# Patient Record
Sex: Female | Born: 1950
Health system: Southern US, Community
[De-identification: ages and names within clinical notes are randomized; demographics above are authoritative.]

## PROBLEM LIST (undated history)

## (undated) DIAGNOSIS — M21619 Bunion of unspecified foot: Secondary | ICD-10-CM

## (undated) DIAGNOSIS — G20 Parkinson's disease: Secondary | ICD-10-CM

## (undated) DIAGNOSIS — I10 Essential (primary) hypertension: Secondary | ICD-10-CM

## (undated) DIAGNOSIS — K5730 Diverticulosis of large intestine without perforation or abscess without bleeding: Secondary | ICD-10-CM

## (undated) DIAGNOSIS — K649 Unspecified hemorrhoids: Secondary | ICD-10-CM

## (undated) DIAGNOSIS — J984 Other disorders of lung: Secondary | ICD-10-CM

## (undated) DIAGNOSIS — G20A1 Parkinson's disease without dyskinesia, without mention of fluctuations: Secondary | ICD-10-CM

## (undated) DIAGNOSIS — G5600 Carpal tunnel syndrome, unspecified upper limb: Secondary | ICD-10-CM

## (undated) DIAGNOSIS — E785 Hyperlipidemia, unspecified: Secondary | ICD-10-CM

## (undated) DIAGNOSIS — F411 Generalized anxiety disorder: Secondary | ICD-10-CM

## (undated) DIAGNOSIS — K219 Gastro-esophageal reflux disease without esophagitis: Secondary | ICD-10-CM

## (undated) DIAGNOSIS — R430 Anosmia: Secondary | ICD-10-CM

## (undated) DIAGNOSIS — R1013 Epigastric pain: Secondary | ICD-10-CM

## (undated) DIAGNOSIS — R7302 Impaired glucose tolerance (oral): Secondary | ICD-10-CM

## (undated) DIAGNOSIS — K589 Irritable bowel syndrome without diarrhea: Secondary | ICD-10-CM

## (undated) DIAGNOSIS — M810 Age-related osteoporosis without current pathological fracture: Secondary | ICD-10-CM

## (undated) DIAGNOSIS — E739 Lactose intolerance, unspecified: Secondary | ICD-10-CM

## (undated) DIAGNOSIS — F329 Major depressive disorder, single episode, unspecified: Secondary | ICD-10-CM

## (undated) HISTORY — DX: Generalized anxiety disorder: F41.1

## (undated) HISTORY — DX: Essential (primary) hypertension: I10

## (undated) HISTORY — DX: Anosmia: R43.0

## (undated) HISTORY — DX: Carpal tunnel syndrome, unspecified upper limb: G56.00

## (undated) HISTORY — DX: Major depressive disorder, single episode, unspecified: F32.9

## (undated) HISTORY — DX: Gastro-esophageal reflux disease without esophagitis: K21.9

## (undated) HISTORY — DX: Unspecified hemorrhoids: K64.9

## (undated) HISTORY — DX: Irritable bowel syndrome without diarrhea: K58.9

## (undated) HISTORY — DX: Diverticulosis of large intestine without perforation or abscess without bleeding: K57.30

## (undated) HISTORY — DX: Age-related osteoporosis without current pathological fracture: M81.0

## (undated) HISTORY — DX: Impaired glucose tolerance (oral): R73.02

## (undated) HISTORY — DX: Parkinson's disease without dyskinesia, without mention of fluctuations: G20.A1

## (undated) HISTORY — DX: Other disorders of lung: J98.4

## (undated) HISTORY — DX: Hyperlipidemia, unspecified: E78.5

## (undated) HISTORY — PX: OTHER SURGICAL HISTORY: SHX169

## (undated) HISTORY — PX: TUBAL LIGATION: SHX77

## (undated) HISTORY — DX: Bunion of unspecified foot: M21.619

## (undated) HISTORY — PX: CARDIAC CATHETERIZATION: SHX172

## (undated) HISTORY — DX: Parkinson's disease: G20

## (undated) HISTORY — PX: UPPER GASTROINTESTINAL ENDOSCOPY: SHX188

## (undated) HISTORY — PX: CATARACT EXTRACTION: SUR2

## (undated) HISTORY — DX: Lactose intolerance, unspecified: E73.9

## (undated) HISTORY — DX: Epigastric pain: R10.13

---

## 1998-03-22 ENCOUNTER — Other Ambulatory Visit: Admission: RE | Admit: 1998-03-22 | Discharge: 1998-03-22 | Payer: Self-pay | Admitting: *Deleted

## 1999-06-28 ENCOUNTER — Other Ambulatory Visit: Admission: RE | Admit: 1999-06-28 | Discharge: 1999-06-28 | Payer: Self-pay | Admitting: *Deleted

## 2000-12-29 ENCOUNTER — Encounter: Payer: Self-pay | Admitting: Internal Medicine

## 2000-12-29 LAB — CONVERTED CEMR LAB

## 2001-01-12 ENCOUNTER — Other Ambulatory Visit: Admission: RE | Admit: 2001-01-12 | Discharge: 2001-01-12 | Payer: Self-pay | Admitting: Internal Medicine

## 2001-08-14 ENCOUNTER — Ambulatory Visit (HOSPITAL_COMMUNITY): Admission: RE | Admit: 2001-08-14 | Discharge: 2001-08-14 | Payer: Self-pay | Admitting: *Deleted

## 2001-08-14 ENCOUNTER — Encounter (INDEPENDENT_AMBULATORY_CARE_PROVIDER_SITE_OTHER): Payer: Self-pay | Admitting: Specialist

## 2003-07-26 ENCOUNTER — Encounter: Admission: RE | Admit: 2003-07-26 | Discharge: 2003-10-24 | Payer: Self-pay | Admitting: Internal Medicine

## 2004-01-02 ENCOUNTER — Ambulatory Visit: Payer: Self-pay | Admitting: Internal Medicine

## 2004-06-28 ENCOUNTER — Ambulatory Visit: Payer: Self-pay | Admitting: Internal Medicine

## 2004-11-22 ENCOUNTER — Encounter: Admission: RE | Admit: 2004-11-22 | Discharge: 2004-11-22 | Payer: Self-pay | Admitting: Orthopedic Surgery

## 2004-11-29 ENCOUNTER — Ambulatory Visit (HOSPITAL_BASED_OUTPATIENT_CLINIC_OR_DEPARTMENT_OTHER): Admission: RE | Admit: 2004-11-29 | Discharge: 2004-11-29 | Payer: Self-pay | Admitting: Orthopedic Surgery

## 2004-11-29 ENCOUNTER — Ambulatory Visit (HOSPITAL_COMMUNITY): Admission: RE | Admit: 2004-11-29 | Discharge: 2004-11-29 | Payer: Self-pay | Admitting: Orthopedic Surgery

## 2004-11-29 ENCOUNTER — Encounter (INDEPENDENT_AMBULATORY_CARE_PROVIDER_SITE_OTHER): Payer: Self-pay | Admitting: Specialist

## 2005-01-11 ENCOUNTER — Ambulatory Visit (HOSPITAL_COMMUNITY): Admission: RE | Admit: 2005-01-11 | Discharge: 2005-01-11 | Payer: Self-pay | Admitting: Orthopedic Surgery

## 2005-01-11 ENCOUNTER — Ambulatory Visit (HOSPITAL_BASED_OUTPATIENT_CLINIC_OR_DEPARTMENT_OTHER): Admission: RE | Admit: 2005-01-11 | Discharge: 2005-01-11 | Payer: Self-pay | Admitting: Orthopedic Surgery

## 2005-01-25 ENCOUNTER — Ambulatory Visit: Payer: Self-pay | Admitting: Internal Medicine

## 2005-07-08 ENCOUNTER — Ambulatory Visit: Payer: Self-pay | Admitting: Internal Medicine

## 2005-08-14 ENCOUNTER — Ambulatory Visit: Payer: Self-pay | Admitting: Internal Medicine

## 2005-08-19 ENCOUNTER — Ambulatory Visit: Payer: Self-pay | Admitting: Internal Medicine

## 2005-09-06 ENCOUNTER — Ambulatory Visit: Payer: Self-pay | Admitting: Family Medicine

## 2006-01-28 HISTORY — PX: COLONOSCOPY W/ BIOPSIES: SHX1374

## 2006-01-28 HISTORY — PX: ESOPHAGOGASTRODUODENOSCOPY: SHX1529

## 2006-01-28 HISTORY — PX: COLONOSCOPY: SHX174

## 2006-05-16 ENCOUNTER — Ambulatory Visit: Payer: Self-pay | Admitting: Internal Medicine

## 2006-05-16 LAB — CONVERTED CEMR LAB
Bilirubin Urine: NEGATIVE
Crystals: NEGATIVE
Mucus, UA: NEGATIVE
Nitrite: POSITIVE — AB
Specific Gravity, Urine: >=1.03 (ref 1.000–1.03)
Total Protein, Urine: 100 mg/dL — AB
Urine Glucose: NEGATIVE mg/dL
Urobilinogen, UA: 0.2 (ref 0.0–1.0)
pH: 6 (ref 5.0–8.0)

## 2006-05-17 ENCOUNTER — Encounter: Payer: Self-pay | Admitting: Internal Medicine

## 2006-09-12 ENCOUNTER — Encounter: Payer: Self-pay | Admitting: Internal Medicine

## 2006-09-12 ENCOUNTER — Ambulatory Visit (HOSPITAL_COMMUNITY): Admission: RE | Admit: 2006-09-12 | Discharge: 2006-09-12 | Payer: Self-pay | Admitting: Gastroenterology

## 2006-09-12 ENCOUNTER — Encounter (INDEPENDENT_AMBULATORY_CARE_PROVIDER_SITE_OTHER): Payer: Self-pay | Admitting: Gastroenterology

## 2006-09-12 LAB — HM COLONOSCOPY

## 2006-09-23 ENCOUNTER — Encounter: Payer: Self-pay | Admitting: Internal Medicine

## 2006-09-23 DIAGNOSIS — F411 Generalized anxiety disorder: Secondary | ICD-10-CM | POA: Insufficient documentation

## 2006-09-23 DIAGNOSIS — K589 Irritable bowel syndrome without diarrhea: Secondary | ICD-10-CM

## 2006-09-23 DIAGNOSIS — G5603 Carpal tunnel syndrome, bilateral upper limbs: Secondary | ICD-10-CM | POA: Insufficient documentation

## 2006-09-23 DIAGNOSIS — I10 Essential (primary) hypertension: Secondary | ICD-10-CM

## 2006-09-23 DIAGNOSIS — G5600 Carpal tunnel syndrome, unspecified upper limb: Secondary | ICD-10-CM

## 2006-09-23 DIAGNOSIS — F329 Major depressive disorder, single episode, unspecified: Secondary | ICD-10-CM

## 2006-09-23 DIAGNOSIS — E785 Hyperlipidemia, unspecified: Secondary | ICD-10-CM

## 2006-09-23 DIAGNOSIS — K5730 Diverticulosis of large intestine without perforation or abscess without bleeding: Secondary | ICD-10-CM

## 2006-09-23 DIAGNOSIS — F32A Depression, unspecified: Secondary | ICD-10-CM | POA: Insufficient documentation

## 2006-09-23 DIAGNOSIS — F3289 Other specified depressive episodes: Secondary | ICD-10-CM

## 2006-09-23 HISTORY — DX: Hyperlipidemia, unspecified: E78.5

## 2006-09-23 HISTORY — DX: Irritable bowel syndrome, unspecified: K58.9

## 2006-09-23 HISTORY — DX: Carpal tunnel syndrome, unspecified upper limb: G56.00

## 2006-09-23 HISTORY — DX: Diverticulosis of large intestine without perforation or abscess without bleeding: K57.30

## 2006-09-23 HISTORY — DX: Essential (primary) hypertension: I10

## 2006-09-23 HISTORY — DX: Other specified depressive episodes: F32.89

## 2006-09-23 HISTORY — DX: Major depressive disorder, single episode, unspecified: F32.9

## 2006-09-23 HISTORY — DX: Generalized anxiety disorder: F41.1

## 2006-10-03 ENCOUNTER — Ambulatory Visit (HOSPITAL_COMMUNITY): Admission: RE | Admit: 2006-10-03 | Discharge: 2006-10-03 | Payer: Self-pay | Admitting: Gastroenterology

## 2006-10-23 ENCOUNTER — Ambulatory Visit: Payer: Self-pay | Admitting: Internal Medicine

## 2006-10-23 LAB — CONVERTED CEMR LAB
ALT: 24 units/L (ref 0–35)
AST: 25 units/L (ref 0–37)
Albumin: 3.8 g/dL (ref 3.5–5.2)
Alkaline Phosphatase: 49 units/L (ref 39–117)
BUN: 15 mg/dL (ref 6–23)
Basophils Absolute: 0 10*3/uL (ref 0.0–0.1)
Basophils Relative: 0.4 % (ref 0.0–1.0)
Bilirubin Urine: NEGATIVE
Bilirubin, Direct: 0.1 mg/dL (ref 0.0–0.3)
CO2: 34 meq/L — ABNORMAL HIGH (ref 19–32)
Calcium: 10.3 mg/dL (ref 8.4–10.5)
Chloride: 102 meq/L (ref 96–112)
Cholesterol: 167 mg/dL (ref 0–200)
Creatinine, Ser: 0.9 mg/dL (ref 0.4–1.2)
Crystals: NEGATIVE
Direct LDL: 86 mg/dL
Eosinophils Absolute: 0.1 10*3/uL (ref 0.0–0.6)
Eosinophils Relative: 1.2 % (ref 0.0–5.0)
GFR calc Af Amer: 83 mL/min
GFR calc non Af Amer: 68 mL/min
Glucose, Bld: 100 mg/dL — ABNORMAL HIGH (ref 70–99)
HCT: 37.3 % (ref 36.0–46.0)
HDL: 53.4 mg/dL (ref >39.0)
Hemoglobin, Urine: NEGATIVE
Hemoglobin: 13 g/dL (ref 12.0–15.0)
Ketones, ur: NEGATIVE mg/dL
Lymphocytes Relative: 15.7 % (ref 12.0–46.0)
MCHC: 34.7 g/dL (ref 30.0–36.0)
MCV: 86.4 fL (ref 78.0–100.0)
Monocytes Absolute: 0.4 10*3/uL (ref 0.2–0.7)
Monocytes Relative: 7.1 % (ref 3.0–11.0)
Mucus, UA: NEGATIVE
Neutro Abs: 3.8 10*3/uL (ref 1.4–7.7)
Neutrophils Relative %: 75.6 % (ref 43.0–77.0)
Nitrite: NEGATIVE
Platelets: 330 10*3/uL (ref 150–400)
Potassium: 4.9 meq/L (ref 3.5–5.1)
RBC / HPF: NONE SEEN
RBC: 4.32 M/uL (ref 3.87–5.11)
RDW: 12.2 % (ref 11.5–14.6)
Sodium: 142 meq/L (ref 135–145)
Specific Gravity, Urine: 1.015 (ref 1.000–1.03)
TSH: 0.77 microintl units/mL (ref 0.35–5.50)
Total Bilirubin: 0.8 mg/dL (ref 0.3–1.2)
Total CHOL/HDL Ratio: 3.1
Total Protein, Urine: NEGATIVE mg/dL
Total Protein: 7 g/dL (ref 6.0–8.3)
Triglycerides: 214 mg/dL (ref 0–149)
Urine Glucose: NEGATIVE mg/dL
Urobilinogen, UA: 0.2 (ref 0.0–1.0)
VLDL: 43 mg/dL — ABNORMAL HIGH (ref 0–40)
WBC: 5.1 10*3/uL (ref 4.5–10.5)
pH: 6.5 (ref 5.0–8.0)

## 2006-10-29 ENCOUNTER — Ambulatory Visit: Payer: Self-pay | Admitting: Internal Medicine

## 2007-04-09 ENCOUNTER — Encounter: Payer: Self-pay | Admitting: Internal Medicine

## 2007-04-21 ENCOUNTER — Encounter: Payer: Self-pay | Admitting: Internal Medicine

## 2007-10-12 ENCOUNTER — Encounter: Payer: Self-pay | Admitting: Internal Medicine

## 2007-10-29 ENCOUNTER — Ambulatory Visit: Payer: Self-pay | Admitting: Family Medicine

## 2007-10-29 ENCOUNTER — Encounter: Payer: Self-pay | Admitting: Internal Medicine

## 2008-02-29 ENCOUNTER — Telehealth: Payer: Self-pay | Admitting: Internal Medicine

## 2008-03-01 ENCOUNTER — Telehealth (INDEPENDENT_AMBULATORY_CARE_PROVIDER_SITE_OTHER): Payer: Self-pay | Admitting: *Deleted

## 2008-03-02 ENCOUNTER — Encounter: Payer: Self-pay | Admitting: Internal Medicine

## 2008-05-13 ENCOUNTER — Telehealth: Payer: Self-pay | Admitting: Internal Medicine

## 2008-05-17 ENCOUNTER — Ambulatory Visit: Payer: Self-pay | Admitting: Internal Medicine

## 2008-05-17 LAB — CONVERTED CEMR LAB
ALT: 23 units/L (ref 0–35)
AST: 25 units/L (ref 0–37)
Albumin: 3.9 g/dL (ref 3.5–5.2)
Alkaline Phosphatase: 52 units/L (ref 39–117)
BUN: 20 mg/dL (ref 6–23)
Basophils Absolute: 0 10*3/uL (ref 0.0–0.1)
Basophils Relative: 0.8 % (ref 0.0–3.0)
Bilirubin Urine: NEGATIVE
Bilirubin, Direct: 0.1 mg/dL (ref 0.0–0.3)
CO2: 32 meq/L (ref 19–32)
Calcium: 9.7 mg/dL (ref 8.4–10.5)
Chloride: 105 meq/L (ref 96–112)
Cholesterol: 183 mg/dL (ref 0–200)
Creatinine, Ser: 0.8 mg/dL (ref 0.4–1.2)
Eosinophils Absolute: 0 10*3/uL (ref 0.0–0.7)
Eosinophils Relative: 1.1 % (ref 0.0–5.0)
GFR calc non Af Amer: 77.86 mL/min (ref >60)
Glucose, Bld: 101 mg/dL — ABNORMAL HIGH (ref 70–99)
HCT: 42.4 % (ref 36.0–46.0)
HDL: 56.3 mg/dL (ref >39.00)
Hemoglobin, Urine: NEGATIVE
Hemoglobin: 14.9 g/dL (ref 12.0–15.0)
Ketones, ur: NEGATIVE mg/dL
LDL Cholesterol: 98 mg/dL (ref 0–99)
Leukocytes, UA: NEGATIVE
Lymphocytes Relative: 17.4 % (ref 12.0–46.0)
Lymphs Abs: 0.8 10*3/uL (ref 0.7–4.0)
MCHC: 35.1 g/dL (ref 30.0–36.0)
MCV: 85.5 fL (ref 78.0–100.0)
Monocytes Absolute: 0.3 10*3/uL (ref 0.1–1.0)
Monocytes Relative: 6.9 % (ref 3.0–12.0)
Neutro Abs: 3.4 10*3/uL (ref 1.4–7.7)
Neutrophils Relative %: 73.8 % (ref 43.0–77.0)
Nitrite: NEGATIVE
Platelets: 300 10*3/uL (ref 150.0–400.0)
Potassium: 4.5 meq/L (ref 3.5–5.1)
RBC: 4.97 M/uL (ref 3.87–5.11)
RDW: 11.7 % (ref 11.5–14.6)
Sodium: 142 meq/L (ref 135–145)
Specific Gravity, Urine: 1.025 (ref 1.000–1.030)
TSH: 1.14 microintl units/mL (ref 0.35–5.50)
Total Bilirubin: 0.8 mg/dL (ref 0.3–1.2)
Total CHOL/HDL Ratio: 3
Total Protein, Urine: NEGATIVE mg/dL
Total Protein: 7.3 g/dL (ref 6.0–8.3)
Triglycerides: 144 mg/dL (ref 0.0–149.0)
Urine Glucose: NEGATIVE mg/dL
Urobilinogen, UA: 0.2 (ref 0.0–1.0)
VLDL: 28.8 mg/dL (ref 0.0–40.0)
WBC: 4.5 10*3/uL (ref 4.5–10.5)
pH: 6 (ref 5.0–8.0)

## 2008-05-24 ENCOUNTER — Ambulatory Visit: Payer: Self-pay | Admitting: Internal Medicine

## 2008-05-24 ENCOUNTER — Telehealth (INDEPENDENT_AMBULATORY_CARE_PROVIDER_SITE_OTHER): Payer: Self-pay | Admitting: *Deleted

## 2008-05-24 DIAGNOSIS — E739 Lactose intolerance, unspecified: Secondary | ICD-10-CM

## 2008-05-24 DIAGNOSIS — K219 Gastro-esophageal reflux disease without esophagitis: Secondary | ICD-10-CM

## 2008-05-24 DIAGNOSIS — M810 Age-related osteoporosis without current pathological fracture: Secondary | ICD-10-CM | POA: Insufficient documentation

## 2008-05-24 HISTORY — DX: Age-related osteoporosis without current pathological fracture: M81.0

## 2008-05-24 HISTORY — DX: Gastro-esophageal reflux disease without esophagitis: K21.9

## 2008-05-24 HISTORY — DX: Lactose intolerance, unspecified: E73.9

## 2008-08-29 ENCOUNTER — Telehealth: Payer: Self-pay | Admitting: Internal Medicine

## 2008-09-28 LAB — CONVERTED CEMR LAB: Pap Smear: NORMAL

## 2008-09-28 LAB — HM MAMMOGRAPHY: HM Mammogram: NORMAL

## 2008-10-24 ENCOUNTER — Encounter: Payer: Self-pay | Admitting: Internal Medicine

## 2008-11-01 ENCOUNTER — Telehealth: Payer: Self-pay | Admitting: Internal Medicine

## 2009-05-05 ENCOUNTER — Telehealth: Payer: Self-pay | Admitting: Internal Medicine

## 2009-08-09 ENCOUNTER — Telehealth: Payer: Self-pay | Admitting: Internal Medicine

## 2009-09-14 ENCOUNTER — Ambulatory Visit: Payer: Self-pay | Admitting: Internal Medicine

## 2009-09-14 LAB — CONVERTED CEMR LAB
ALT: 23 units/L (ref 0–35)
AST: 24 units/L (ref 0–37)
Albumin: 3.9 g/dL (ref 3.5–5.2)
Alkaline Phosphatase: 45 units/L (ref 39–117)
BUN: 18 mg/dL (ref 6–23)
Basophils Absolute: 0 10*3/uL (ref 0.0–0.1)
Basophils Relative: 0.7 % (ref 0.0–3.0)
Bilirubin Urine: NEGATIVE
Bilirubin, Direct: 0.2 mg/dL (ref 0.0–0.3)
CO2: 29 meq/L (ref 19–32)
Calcium: 9.6 mg/dL (ref 8.4–10.5)
Chloride: 102 meq/L (ref 96–112)
Cholesterol: 157 mg/dL (ref 0–200)
Creatinine, Ser: 0.8 mg/dL (ref 0.4–1.2)
Eosinophils Absolute: 0.1 10*3/uL (ref 0.0–0.7)
Eosinophils Relative: 1.5 % (ref 0.0–5.0)
GFR calc non Af Amer: 83.51 mL/min (ref >60)
Glucose, Bld: 81 mg/dL (ref 70–99)
HCT: 40.2 % (ref 36.0–46.0)
HDL: 48.8 mg/dL (ref >39.00)
Hemoglobin, Urine: NEGATIVE
Hemoglobin: 13.9 g/dL (ref 12.0–15.0)
Ketones, ur: NEGATIVE mg/dL
LDL Cholesterol: 74 mg/dL (ref 0–99)
Leukocytes, UA: NEGATIVE
Lymphocytes Relative: 21.1 % (ref 12.0–46.0)
Lymphs Abs: 1.1 10*3/uL (ref 0.7–4.0)
MCHC: 34.7 g/dL (ref 30.0–36.0)
MCV: 89.8 fL (ref 78.0–100.0)
Monocytes Absolute: 0.4 10*3/uL (ref 0.1–1.0)
Monocytes Relative: 7 % (ref 3.0–12.0)
Neutro Abs: 3.5 10*3/uL (ref 1.4–7.7)
Neutrophils Relative %: 69.7 % (ref 43.0–77.0)
Nitrite: NEGATIVE
Platelets: 306 10*3/uL (ref 150.0–400.0)
Potassium: 4.5 meq/L (ref 3.5–5.1)
RBC: 4.47 M/uL (ref 3.87–5.11)
RDW: 12.1 % (ref 11.5–14.6)
Sodium: 139 meq/L (ref 135–145)
Specific Gravity, Urine: 1.02 (ref 1.000–1.030)
TSH: 0.92 microintl units/mL (ref 0.35–5.50)
Total Bilirubin: 0.5 mg/dL (ref 0.3–1.2)
Total CHOL/HDL Ratio: 3
Total Protein, Urine: NEGATIVE mg/dL
Total Protein: 6.6 g/dL (ref 6.0–8.3)
Triglycerides: 173 mg/dL — ABNORMAL HIGH (ref 0.0–149.0)
Urine Glucose: NEGATIVE mg/dL
Urobilinogen, UA: 0.2 (ref 0.0–1.0)
VLDL: 34.6 mg/dL (ref 0.0–40.0)
WBC: 5 10*3/uL (ref 4.5–10.5)
pH: 7 (ref 5.0–8.0)

## 2009-09-18 ENCOUNTER — Encounter: Payer: Self-pay | Admitting: Internal Medicine

## 2009-09-18 ENCOUNTER — Ambulatory Visit: Payer: Self-pay | Admitting: Internal Medicine

## 2009-09-18 DIAGNOSIS — M21619 Bunion of unspecified foot: Secondary | ICD-10-CM

## 2009-09-18 DIAGNOSIS — R1013 Epigastric pain: Secondary | ICD-10-CM

## 2009-09-18 HISTORY — DX: Bunion of unspecified foot: M21.619

## 2009-09-18 HISTORY — DX: Epigastric pain: R10.13

## 2009-09-21 ENCOUNTER — Encounter: Payer: Self-pay | Admitting: Internal Medicine

## 2009-09-21 ENCOUNTER — Encounter (INDEPENDENT_AMBULATORY_CARE_PROVIDER_SITE_OTHER): Payer: Self-pay | Admitting: *Deleted

## 2010-02-25 LAB — CONVERTED CEMR LAB: Pap Smear: NORMAL

## 2010-02-27 NOTE — Letter (Signed)
Summary: [facility] Consult Scheduled Letter  [facility]  [address] [place], [place] [address]   Phone: [phone]  Fax: [phone]      09/21/2009 MRN: [id number]  TYSHAWN KEEL [address] [place], [place]  [address]    Dear Ms. Dareen Piano,      We have scheduled an appointment for you.  At the recommendation of Dr.James Jonny Ruiz we have scheduled you a consult with [facility]) Dr.Ajlouny on 09-29-2009 at 11:00am arrive at 10:45.Their phone number is [phone]. If this appointment day and time is not convenient for you, please feel free to call the office of the doctor you are being referred to at the number listed above and reschedule the appointment.  [facility] [address], [place], [place] [address]  [phone]   Thank you,  Patient Care Coordinator [facility]

## 2010-02-27 NOTE — Progress Notes (Signed)
  Phone Note Refill Request  on May 05, 2009 1:09 PM  Refills Requested: Medication #1:  ACTONEL 150 MG TABS 1 by mouth q month   Dosage confirmed as above?Dosage Confirmed   Notes: [facility] Initial call taken by: Scharlene Gloss,  May 05, 2009 1:10 PM    Prescriptions: ACTONEL 150 MG TABS (RISEDRONATE SODIUM) 1 by mouth q month  #3 x 0   Entered by:   Scharlene Gloss   Authorized by:   Corwin Levins MD   Signed by:   Scharlene Gloss on 05/05/2009   Method used:   Faxed to ...       [facility]* (retail)       [address]       [address] Shipping/mailing       [place], [place]  [address]       Ph: [phone]       Fax: [phone]   RxID:   [id number]

## 2010-02-27 NOTE — Assessment & Plan Note (Signed)
Summary: PHYSICAL--STC   Vital Signs:  Patient profile:   60 year old female Height:      58.5 inches Weight:      127.50 pounds BMI:     26.29 O2 Sat:      95 % on Room air Temp:     98.3 degrees F oral Pulse rate:   92 / minute BP sitting:   120 / 82  (left arm) Cuff size:   regular  Vitals Entered By: Zella Ball Ewing CMA Duncan Dull) (September 18, 2009 10:33 AM)  O2 Flow:  Room air  Preventive Care Screening  Pap Smear:    Date:  09/28/2008    Results:  normal   Mammogram:    Date:  09/28/2008    Results:  normal   Bone Density:    Date:  10/12/2007    Results:  abnormal std dev  CC: Adult Physical/RE   CC:  Adult Physical/RE.  History of Present Illness: overall doing well;  lost several lbs; Pt denies CP, worsening sob, doe, wheezing, orthopnea, pnd, worsening LE edema, palps, dizziness or syncope  Pt denies new neuro symptoms such as headache, facial or extremity weakness No fever, wt loss, night sweats, loss of appetite or other constitutional symptoms Has had some nauea persistent on the actonel adn not clear if having significant reflux;  no dysphgia, vomiting but has some upper abd discofort.  Last EGD 2008 per Dr Loreta Ave neg for cancer.  Still with occas diarrhea nad has known hemorrhoids and still wtih occas small volume hematochiezia.   Weaned herself off the sertarline last yr, still with mild recurring depressive symtpoms, but not severe enough ot re-start meds.  has mild pain for the right bunion that keeps recurring.    Problems Prior to Update: 1)  Bunion, Right Foot  (ICD-727.1) 2)  Abdominal Pain, Epigastric  (ICD-789.06) 3)  Preventive Health Care  (ICD-V70.0) 4)  Gerd  (ICD-530.81) 5)  Glucose Intolerance  (ICD-271.3) 6)  Osteoporosis  (ICD-733.00) 7)  Carpal Tunnel Syndrome, Bilateral  (ICD-354.0) 8)  Diverticulosis, Colon  (ICD-562.10) 9)  Depression  (ICD-311) 10)  Anxiety  (ICD-300.00) 11)  Ibs  (ICD-564.1) 12)  Hypertension  (ICD-401.9) 13)   Hyperlipidemia  (ICD-272.4)  Medications Prior to Update: 1)  Cartia Xt 300 Mg  Cp24 (Diltiazem Hcl Coated Beads) .Marland Kitchen.. 1 By Mouth Once Daily 2)  Zoloft 100 Mg  Tabs (Sertraline Hcl) .Marland Kitchen.. 1 By Mouth Two Times A Day 3)  Benicar Hct 40-25 Mg  Tabs (Olmesartan Medoxomil-Hctz) .Marland Kitchen.. 1 By Mouth Once Daily 4)  Aspirin 81mg  .... 1 3-4x Week 5)  Multivitamin .Marland Kitchen.. 1 3-4x Week 6)  Oscal .... 1 3-4x Week 7)  Actonel 150 Mg Tabs (Risedronate Sodium) .Marland Kitchen.. 1 By Mouth Q Month 8)  Crestor 40 Mg Tabs (Rosuvastatin Calcium) .... Take 1/2  Tablet By Mouth Once A Day 9)  Adult Aspirin Ec Low Strength 81 Mg Tbec (Aspirin) .Marland Kitchen.. 1po Once Daily  Current Medications (verified): 1)  Cartia Xt 300 Mg  Cp24 (Diltiazem Hcl Coated Beads) .Marland Kitchen.. 1 By Mouth Once Daily 2)  Benicar Hct 40-25 Mg  Tabs (Olmesartan Medoxomil-Hctz) .Marland Kitchen.. 1 By Mouth Once Daily 3)  Aspirin 81mg  .... 1 3-4x Week 4)  Multivitamin .Marland Kitchen.. 1 3-4x Week 5)  Oscal .... 1 3-4x Week 6)  Actonel 150 Mg Tabs (Risedronate Sodium) .Marland Kitchen.. 1 By Mouth Q Month 7)  Crestor 40 Mg Tabs (Rosuvastatin Calcium) .... Take 1/2  Tablet By Mouth Once A Day  8)  Adult Aspirin Ec Low Strength 81 Mg Tbec (Aspirin) .Marland Kitchen.. 1po Once Daily 9)  Estring 2 Mg Ring (Estradiol) .... Once Every 3 Months  Allergies (verified): 1)  ! 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, retail Married 2  children 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         all otherwise negative per pt -    Physical Exam  General:  alert and overweight-appearing - mild for ht Head:  normocephalic and atraumatic.   Eyes:  vision grossly intact, pupils equal, and pupils round.   Ears:  R ear normal and L ear normal.   Nose:  no external deformity and no nasal discharge.   Mouth:  no gingival abnormalities and pharynx pink and moist.   Neck:  supple and no masses.   Lungs:  normal respiratory effort and normal breath sounds.   Heart:  normal rate and regular rhythm.   Abdomen:  soft, non-tender, and normal bowel sounds.   Msk:  no joint tenderness and no joint swelling.  , right bunion noted mild to mod tender, red, swelling Extremities:  no edema, no erythema  Neurologic:  cranial nerves II-XII intact and strength normal in all extremities.   Skin:  color normal and no rashes.  but has numerous small moles to arms and torso, last derm eval approx 5 yrs ago Psych:  not anxious appearing and not depressed appearing.     Impression & Recommendations:  Problem # 1:  Preventive Health Care (ICD-V70.0) Overall doing well, age appropriate education and counseling updated and referral for appropriate preventive services done unless declined, immunizations up to date or declined, diet counseling done if overweight, urged to quit smoking if smokes , most recent labs  reviewed and current ordered if appropriate, ecg reviewed or declined (interpretation per ECG scanned in the EMR if done); information regarding Medicare Prevention requirements given if appropriate; speciality referrals updated as appropriate  Orders: EKG w/ Interpretation (93[address])  Problem # 2:  ABDOMINAL PAIN, EPIGASTRIC (ICD-789.06)  ok for  PPI, and refer GI, consider stop actonel   Orders: Gastroenterology Referral (GI)  Problem # 3:  BUNION, RIGHT FOOT (ICD-727.1)  ok for referral to podiatry  Orders: Podiatry Referral (Podiatry)  Problem # 4:  HYPERTENSION (ICD-401.9)  Her updated medication list for this problem includes:    Cartia Xt 300 Mg Cp24 (Diltiazem hcl coated beads) .Marland Kitchen... 1 by mouth once daily    Benicar Hct 40-25 Mg Tabs (Olmesartan medoxomil-hctz) .Marland Kitchen... 1 by mouth once daily  BP today: 120/82 Prior BP: 118/82 (06/03/08)  Labs Reviewed: K+:  4.5 (09/14/2009) Creat: : 0.8 (09/14/2009)   Chol: 157 (09/14/2009)   HDL: 48.80 (09/14/2009)   LDL: 74 (09/14/2009)   TG: 173.0 (09/14/2009) stable overall by hx and exam, ok to continue meds/tx as is   Problem # 5:  DEPRESSION (ICD-311)  The following medications were removed from the medication list:    Zoloft 100 Mg Tabs (Sertraline hcl) .Marland Kitchen... 1 by mouth two times a day mld, declines further tx at this time  Complete Medication List: 1)  Cartia Xt 300 Mg Cp24 (Diltiazem hcl coated beads) .Marland Kitchen.. 1 by mouth once daily 2)  Benicar Hct 40-25 Mg Tabs (Olmesartan medoxomil-hctz) .Marland Kitchen.. 1 by mouth once daily 3)  Aspirin 81mg   .... 1 3-4x week 4)  Multivitamin  .Marland Kitchen.. 1 3-4x week 5)  Oscal  .... 1 3-4x week 6)  Actonel 150 Mg Tabs (Risedronate sodium) .Marland Kitchen.. 1 by mouth q month 7)  Crestor 40 Mg Tabs (Rosuvastatin calcium) .... Take 1/2  tablet by mouth once a day 8)  Adult Aspirin Ec Low Strength 81 Mg Tbec (Aspirin) .Marland Kitchen.. 1po once daily 9)  Estring 2 Mg Ring (Estradiol) .... Once every 3 months  Patient Instructions: 1)   please check with your GYN next month if you are due for the bone density 2)  You will be contacted about the referral(s) to: GI, and podiatry 3)  please see employee health about the tetanus shot 4)  Continue all previous medications as before this visit  5)  Consider seeing dermatology on a 1-2 yr basis 6)  Please schedule a follow-up appointment in 1 year, or sooner if  needed Prescriptions: CRESTOR 40 MG TABS (ROSUVASTATIN CALCIUM) Take 1/2  tablet by mouth once a day  #45 x 3   Entered and Authorized by:   Corwin Levins MD   Signed by:   Corwin Levins MD on 09/18/2009   Method used:   Print then Give to Patient   RxID:   [id number] ACTONEL 150 MG TABS (RISEDRONATE SODIUM) 1 by mouth q month  #3 x 3   Entered and Authorized by:   Corwin Levins MD   Signed by:   Corwin Levins MD on 09/18/2009   Method used:   Print then Give to Patient   RxID:   [id number] BENICAR HCT 40-25 MG  TABS (OLMESARTAN MEDOXOMIL-HCTZ) 1 by mouth once daily  #90 x 3   Entered and Authorized by:   Corwin Levins MD   Signed by:   Corwin Levins MD on 09/18/2009   Method used:   Print then Give to Patient   RxID:   [id number] CARTIA XT 300 MG  CP24 (DILTIAZEM HCL COATED BEADS) 1 by mouth once daily  #90 x 3   Entered and Authorized by:   Corwin Levins MD   Signed by:   Corwin Levins MD on 09/18/2009   Method used:   Print then Give to Patient   RxID:   [id number]

## 2010-02-27 NOTE — Progress Notes (Signed)
Summary: Rx refill req  Phone Note Call from Patient Call back at Work Phone [phone]   Caller: Patient Initial call taken by: Margaret Pyle, CMA,  August 09, 2009 11:40 AM    Prescriptions: BENICAR HCT 40-25 MG  TABS (OLMESARTAN MEDOXOMIL-HCTZ) 1 by mouth once daily  #90 x 3   Entered by:   Margaret Pyle, CMA   Authorized by:   Corwin Levins MD   Signed by:   Margaret Pyle, CMA on 08/09/2009   Method used:   Electronically to        [facility]* (retail)       [address]       [address] Shipping/mailing       [place], [place]  [address]       Ph: [phone]       Fax: [phone]   RxID:   [id number] CARTIA XT 300 MG  CP24 (DILTIAZEM HCL COATED BEADS) 1 by mouth once daily  #90 x 3   Entered by:   Margaret Pyle, CMA   Authorized by:   Corwin Levins MD   Signed by:   Margaret Pyle, CMA on 08/09/2009   Method used:   Electronically to        [facility]* (retail)       [address]       [address] Shipping/mailing       [place], [place]  [address]       Ph: [phone]       Fax: [phone]   RxID:   [id number] CRESTOR 40 MG TABS (ROSUVASTATIN CALCIUM) Take 1/2  tablet by mouth once a day  #45 x 3   Entered by:   Margaret Pyle, CMA   Authorized by:   Corwin Levins MD   Signed by:   Margaret Pyle, CMA on 08/09/2009   Method used:   Electronically to        [facility]* (retail)       [address]       [address] Shipping/mailing       [place], [place]  [address]       Ph: [phone]       Fax: [phone]   RxID:   [id number] ACTONEL 150 MG TABS (RISEDRONATE SODIUM) 1 by mouth q month  #3 x 3   Entered by:   Margaret Pyle, CMA   Authorized by:   Corwin Levins MD   Signed by:   Margaret Pyle, CMA on 08/09/2009   Method used:   Electronically to        [facility]* (retail)  [address]       [address] Shipping/mailing       [place], [place]  [address]       Ph: [phone]       Fax: [phone]   RxID:   [id number]

## 2010-05-07 ENCOUNTER — Encounter: Payer: Self-pay | Admitting: Internal Medicine

## 2010-05-07 ENCOUNTER — Ambulatory Visit (INDEPENDENT_AMBULATORY_CARE_PROVIDER_SITE_OTHER): Payer: 59 | Admitting: Internal Medicine

## 2010-05-07 DIAGNOSIS — Z0000 Encounter for general adult medical examination without abnormal findings: Secondary | ICD-10-CM

## 2010-05-07 DIAGNOSIS — F3289 Other specified depressive episodes: Secondary | ICD-10-CM

## 2010-05-07 DIAGNOSIS — H9190 Unspecified hearing loss, unspecified ear: Secondary | ICD-10-CM

## 2010-05-07 DIAGNOSIS — I10 Essential (primary) hypertension: Secondary | ICD-10-CM

## 2010-05-07 DIAGNOSIS — F411 Generalized anxiety disorder: Secondary | ICD-10-CM

## 2010-05-07 DIAGNOSIS — F329 Major depressive disorder, single episode, unspecified: Secondary | ICD-10-CM

## 2010-05-07 DIAGNOSIS — M810 Age-related osteoporosis without current pathological fracture: Secondary | ICD-10-CM

## 2010-05-07 MED ORDER — DULOXETINE HCL 60 MG PO CPEP: 60.0000 mg | ORAL_CAPSULE | Freq: Every day | ORAL | Status: DC

## 2010-05-07 NOTE — Assessment & Plan Note (Signed)
For tx as per depression, declines counseling at this time, but may re-consider if needed in the future

## 2010-05-07 NOTE — Assessment & Plan Note (Signed)
With recent worsening again with obvious recent work stress,  For cymbalta sample today (she also has recurrent mild LBP) at 30 mg for one wk, adn 60 mg per day after, with  to f/u any worsening symptoms or concerns

## 2010-05-07 NOTE — Assessment & Plan Note (Addendum)
Mild, for left ear irrigation today of wax impaction

## 2010-05-07 NOTE — Assessment & Plan Note (Signed)
With osteeopenia 2009 on actonel for over 5 yrs - to stop the actonel;  Check dxa now and in 2 yrs; consider prolia in the future if worsenes, to cont the vitd/calcium supp

## 2010-05-07 NOTE — Progress Notes (Signed)
Subjective:    Patient ID: Destiny Roth, female    DOB: 11-17-1950, 60 y.o.   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  Started the conversion to EPIC feb 1, with stress over months before that.  Has much longer hours starting 4am sometimes and mult providers she worked with unhappy with the conversion.  Lasted for 6 wks intensively, then some better after but still not going great. Still symptoms despite taking the past wk off work   Denies  suicidal ideation, or panic, but has anhedonia, lack of enjoyment.  Pt denies chest pain, increased sob or doe wheezing, orthopnea, PND, increased LE swelling, palpitations, dizziness or syncope.  Pt denies new neurological symptoms such as new headache, or facial or extremity weakness or numbness  Pt denies polydipsia, polyuria.  Did have some apathy with the prior zoloft 2 yrs ago (high dose).  No ETOH or illiicit drug use.  Also incidentally with decrsased hearing left ear for over a wk - ? Wax.  Also of note is that she has been on actonel for > 5 yrs and would like off for now      Past Medical History  Diagnosis Date  . GLUCOSE INTOLERANCE 05/24/2008  . HYPERLIPIDEMIA 09/23/2006  . ANXIETY 09/23/2006  . DEPRESSION 09/23/2006  . CARPAL TUNNEL SYNDROME, BILATERAL 09/23/2006  . HYPERTENSION 09/23/2006  . GERD 05/24/2008  . DIVERTICULOSIS, COLON 09/23/2006  . IBS 09/23/2006  . BUNION, RIGHT FOOT 09/18/2009  . OSTEOPOROSIS 05/24/2008  . Abdominal pain, epigastric 09/18/2009   Past Surgical History  Procedure Date  . Tubal ligation   . Nasal septum repair    . S/p ganglion cyst right wrist   . S/p bilat cts   . S/p right thumb trigger finger surgury   . S/p fibroid ablation     reports that she has never smoked. She does not have any smokeless tobacco history on file. She reports that she drinks alcohol. Her drug history not on file. family history includes Heart attack in her mother; Hypertension in her mother; and Peripheral vascular disease in her mother. Allergies  Allergen Reactions  . Ezetimibe-Simvastatin    No current outpatient prescriptions on file prior to visit.   Review of Systems Review of Systems  Constitutional: Negative for diaphoresis and unexpected weight change.  HENT: Negative for drooling and tinnitus.   Eyes: Negative for photophobia and visual disturbance.  Respiratory: Negative for choking and stridor.   Gastrointestinal: Negative for vomiting and blood in stool.  Genitourinary: Negative for hematuria and decreased urine volume.  Musculoskeletal: Negative for gait problem.  Does have some ? discoordination with washing hair with the left hand in the am Skin: Negative for color change and wound.  Neurological: Negative for tremors and numbness.  Psychiatric/Behavioral: The patient is not hyperactive.  + decrsased concentration, o/w as above     Objective:   Physical ExamBP 110/80  Pulse 88  Temp(Src) 98.5 F (36.9 C) (Oral)  Ht 4\' 9"  (1.448 m)  Wt 122 lb 8 oz (55.566 kg)  BMI 26.51 kg/m2  SpO2 98% Physical Exam  VS noted Constitutional: Pt appears well-developed and well-nourished.  HENT: Head: Normocephalic.  Right Ear: External ear normal.  Left Ear: External ear normal.  Eyes: Conjunctivae and EOM are normal. Pupils are equal, round, and reactive to light.  Neck: Normal range of motion. Neck supple.  Cardiovascular: Normal rate and regular rhythm.   Pulmonary/Chest: Effort normal and breath sounds normal.  Abd:  Soft, NT, non-distended, + BS Neurological: Pt is alert. No cranial nerve deficit.    Skin: Skin is warm. No erythema.  Psychiatric: Pt behavior is normal. Thought content normal. has decreased affect, fatigued appearing, 1+ nervous         Assessment & Plan:

## 2010-05-07 NOTE — Patient Instructions (Addendum)
OK to stop the actonel Start the cymbalta with 30 mg per day for 1 wk, then 60 mg per day after that Your 60 mg prescription was sent to the [facility] Your left ear was irrigated today Please schedule the bone density before leaving today Please return in 6 mo with Lab testing done 3-5 days before, or sooner if needed

## 2010-05-07 NOTE — Assessment & Plan Note (Signed)
stable overall by hx and exam, most recent lab reviewed with pt, and pt to continue medical treatment as before  BP Readings from Last 3 Encounters:  05/07/10 110/80  09/18/09 120/82  05/24/08 118/82    Lab Results  Component Value Date   WBC 5.0 09/14/2009   HGB 13.9 09/14/2009   HGB NEGATIVE 09/14/2009   HCT 40.2 09/14/2009   PLT 306.0 09/14/2009   CHOL 157 09/14/2009   TRIG 173.0* 09/14/2009   HDL 48.80 09/14/2009   LDLDIRECT 86.0 10/23/2006   ALT 23 09/14/2009   AST 24 09/14/2009   NA 139 09/14/2009   K 4.5 09/14/2009   CL 102 09/14/2009   CREATININE 0.8 09/14/2009   BUN 18 09/14/2009   CO2 29 09/14/2009   TSH 0.92 09/14/2009

## 2010-05-22 ENCOUNTER — Other Ambulatory Visit: Payer: 59

## 2010-05-22 ENCOUNTER — Ambulatory Visit (INDEPENDENT_AMBULATORY_CARE_PROVIDER_SITE_OTHER)
Admission: RE | Admit: 2010-05-22 | Discharge: 2010-05-22 | Disposition: A | Discharge status: Home / Self Care | Payer: 59 | Source: Ambulatory Visit | Attending: Internal Medicine | Admitting: Internal Medicine

## 2010-05-22 ENCOUNTER — Other Ambulatory Visit: Payer: Self-pay | Admitting: Internal Medicine

## 2010-05-22 DIAGNOSIS — M810 Age-related osteoporosis without current pathological fracture: Secondary | ICD-10-CM

## 2010-06-12 NOTE — Op Note (Signed)
NAME:  Destiny Roth, Destiny Roth           ACCOUNT NO.:  [id number]   MEDICAL RECORD NO.:  [id number]          PATIENT TYPE:  AMB   LOCATION:  ENDO                         FACILITY:  [facility]   PHYSICIAN:  Anselmo Rod, M.D.  DATE OF BIRTH:  Mar 03, 1950   DATE OF PROCEDURE:  09/12/2006  DATE OF DISCHARGE:                               OPERATIVE REPORT   PROCEDURE PERFORMED:  Esophagogastroduodenoscopy with multiple cold  biopsies.   ENDOSCOPIST:  Anselmo Rod, M.D.   INSTRUMENT USED:  Pentax video panendoscope.   INDICATIONS FOR THE PROCEDURE:  This is a 60 year old white female with  a history of blood in stool and change in bowel habits who  undergoing  an EGD to rule out SPRUE.   PREPROCEDURE PREPARATION:  Informed consent was procured from the  patient.  The patient fasted for eight hours prior to the procedure.  Risks and benefits of the procedure were discussed with her in detail.   PREPROCEDURE PHYSICAL EXAMINATION:  VITAL SIGNS:  The patient has stable  vital signs.  NECK:  The neck is supple.  CHEST:  The chest is clear to auscultation.  HEART:  Heart shows S1 and S2, and regular.  ABDOMEN:  The abdomen is soft with normal bowel sounds.   DESCRIPTION OF THE PROCEDURE:  The patient was placed in the left  lateral decubitus position and sedated with 75 mcg of Fentanyl and 8 mg  of Versed.  Once the patient was adequately sedated, maintained on low-  flow oxygen and continuous cardiac monitoring the Pentax video  panendoscope was advanced through the mouthpiece,  over the tongue and  into the esophagus under direct vision.  The entire esophagus widely  patent with no evidence of ring, stricture, mass, esophagitis, or  Barrett's mucosa.  The patient had a healthy Z-line. The scope was then  advanced into the stomach.  The entire gastric mucosa appeared healthy.  No ulcers, erosions, masses or polyps were identified.  Retroflexion in  the high cardia revealed no abnormalities.   There was some ulceration  noted in the duodenal bulb and the proximal small bowel that was  biopsied for pathology to rule out inflammatory bowel disease.  Small-  bowel biopsies were also done to rule out sprue.  There was no outlet  obstruction. The patient tolerated the procedure well without immediate  complications.   IMPRESSION:  1. Normal appearing esophagus.  2. Normal appearing gastric mucosa.  3. Ulceration of the duodenal bulb in the proximal small bowel with      biopsies done; results pending.   RECOMMENDATIONS:  1. Await pathology results.  2. Avoid all nonsteroidals for now.  3. Proceed with a colonoscopy at this time.  4. Further recommendations to be made thereafter.      Anselmo Rod, M.D.  Electronically Signed     JNM/MEDQ  D:  09/12/2006  T:  09/13/2006  Job:  [id number]   cc:   Roseanna Rainbow, M.D.  Fax: [phone]   Corwin Levins, MD  [address]  [place]  [place] [address]

## 2010-06-12 NOTE — Op Note (Signed)
NAME:  Destiny Roth, Destiny Roth           ACCOUNT NO.:  [id number]   MEDICAL RECORD NO.:  [id number]          PATIENT TYPE:  AMB   LOCATION:  ENDO                         FACILITY:  [facility]   PHYSICIAN:  Anselmo Rod, M.D.  DATE OF BIRTH:  07-14-1950   DATE OF PROCEDURE:  09/12/2006  DATE OF DISCHARGE:                               OPERATIVE REPORT   PROCEDURE PERFORMED:  Colonoscopy with multiple cold biopsies.   ENDOSCOPIST:  Anselmo Rod, MD   INSTRUMENT USED:  Pentax video colonoscope.   INDICATIONS FOR PROCEDURE:  A 60 year old white female with a history of  change in bowel habits, severe diarrhea, rectal bleeding and abdominal  pain, rule out IBD, masses, polyps, etc.   PREPROCEDURE PREPARATION:  Informed consent was procured from the  patient.  The patient was fasted for 8 hours prior to the procedure and  prepped with a bottle of magnesium citrate and a gallon of NuLytely the  night prior to the procedure.  Risks and benefits of the procedure  including a 10% miss rate of cancer and polyp were discussed with the  patient as well.   PREPROCEDURE PHYSICAL:  VITAL SIGNS:  The patient had stable vital  signs.  NECK:  Supple.  CHEST:  Clear to auscultation.  S1 and S2 regular.  ABDOMEN:  Soft with normal bowel sounds.   DESCRIPTION OF THE PROCEDURE:  The patient was placed in the left  lateral decubitus position and sedated with an additional 25 mcg of  Fentanyl and 2 mg of Versed given intravenously in slow incremental  doses.  Once the patient was adequately sedated and maintained on low-  flow oxygen and continuous cardiac monitoring, the Pentax video  colonoscope was advanced from the rectum to the cecum.  The appendiceal  orifice and ileocecal valve were clearly visualized and photographed.  The terminal ileum appeared healthy and without lesions.  A few early  sigmoid diverticula were noted.  A prominent fold was biopsied from the  anal verge.  Small internal  hemorrhoids were seen on retroflexion.  Random colon biopsies were done to rule out collagenous colitis.  The  patient tolerated the procedure well without immediate complications.   IMPRESSION:  1. Small internal hemorrhoids.  2. Prominent fold biopsied close to the anal verge.  3. Early sigmoid diverticulosis.  4. Normal-appearing transverse colon, right colon, cecum and terminal      ileum.  5. Random colon biopsies done to rule out collagenous versus      lymphocytic colitis.   RECOMMENDATIONS:  1. Await pathology results.  2. Avoid all nonsteroidals including aspirin for the next 4 weeks.  3. Outpatient followup in the next 2 weeks for further      recommendations, once the biopsy results have been procured.      Anselmo Rod, M.D.  Electronically Signed     JNM/MEDQ  D:  09/12/2006  T:  09/13/2006  Job:  [id number]   cc:   Roseanna Rainbow, M.D.  Fax: [phone]   Corwin Levins, MD  [address]  [place]  [place] [address]

## 2010-06-15 NOTE — Op Note (Signed)
NAME:  Destiny Roth, BROXTERMAN           ACCOUNT NO.:  [id number]   MEDICAL RECORD NO.:  [id number]          PATIENT TYPE:  AMB   LOCATION:  DSC                          FACILITY:  [facility]   PHYSICIAN:  Katy Fitch. Sypher, M.D. DATE OF BIRTH:  January 05, 1951   DATE OF PROCEDURE:  01/11/2005  DATE OF DISCHARGE:  01/11/2005                                 OPERATIVE REPORT   PREOPERATIVE DIAGNOSES:  Chronic entrapment neuropathy left median nerve at  carpal tunnel.   POSTOPERATIVE DIAGNOSES:  Chronic entrapment neuropathy left median nerve at  carpal tunnel.   OPERATION:  Release of left transverse carpal ligament.   OPERATIONS:  Katy Fitch. Sypher, M.D.   ASSISTANT:  None.   ANESTHESIA:  General by LMA. Supervising anesthesiologist Sheldon Silvan, MD.   INDICATIONS:  [name] is a 54-year woman who was referred for  evaluation and management of bilateral hand numbness and a locking right  thumb due to stenosing tenosynovitis.   She is status post release of her right thumb A1 pulley and release of her  right transverse carpal ligament with satisfactory response to therapy.   She now presents for similar surgery on the left.   Preoperatively she was evaluated by Dr. Johna Roles with electrodiagnostic  studies demonstrating moderately severe left carpal tunnel syndrome.   She has failed nonoperative management.   DESCRIPTION OF PROCEDURE:  [name] is brought to the operating  room and placed in supine position upon the operating table.   Following the induction of general anesthesia by LMA, the left arm was  prepped with Betadine soap solution, sterilely draped. Following  exsanguination of the left arm with an Esmarch bandage and arterial  tourniquet, the proximal brachium was inflated to 220 mmHg. The procedure  commenced with a short incision in the line of the ring finger and the palm.  The subcutaneous tissues were carefully divided revealing the palmar fascia.  This was  split longitudinally to reveal the common extensor branch median  nerve.   These were followed back to the transverse carpal ligament which was  carefully isolated from the median nerve. The ligament was carefully  separated from the median nerve with a Penfield 4 elevator followed by  release of the ligament with scissors extending into the distal forearm.   This widely opened the carpal canal.   No massed or predicaments are noted.   Bleeding points along the margin of the released ligament were  electrocauterized with bipolar current followed by repair of the skin with  intradermal 3-0 Prolene. The wound was infiltrated with 0.25% Marcaine for  postoperative analgesia followed by application of a volar plaster splint  maintaining less than 5 degrees of dorsiflexion.   There are no apparent complications.   Ms. Gauer has a prescription for Percocet at home. She will use Motrin  __________ mg 1 p.o. q.6 h p.r.n. pain as needed. She will return for follow-  up in 1 week.      Katy Fitch Sypher, M.D.  Electronically Signed     RVS/MEDQ  D:  01/11/2005  T:  01/14/2005  Job:  [id number]

## 2010-06-15 NOTE — Op Note (Signed)
NAME:  Destiny Roth, Destiny Roth           ACCOUNT NO.:  [id number]   MEDICAL RECORD NO.:  [id number]          PATIENT TYPE:  AMB   LOCATION:  DSC                          FACILITY:  [facility]   PHYSICIAN:  Katy Fitch. Sypher, M.D. DATE OF BIRTH:  12-Feb-1950   DATE OF PROCEDURE:  11/29/2004  DATE OF DISCHARGE:                                 OPERATIVE REPORT   PREOPERATIVE DIAGNOSES:  1.  Entrapment neuropathy, right median nerve at carpal tunnel.  2.  Chronic stenosing tenosynovitis of right thumb at A1 pulley.  3.  Enlarging volar ganglion.   POSTOPERATIVE DIAGNOSES:  1.  Entrapment neuropathy, right median nerve at carpal tunnel.  2.  Chronic stenosing tenosynovitis of right thumb at A1 pulley.  3.  Enlarging volar ganglion.   OPERATION:  1.  Extension of right volar ganglion.  2.  Release of right thumb A1 pulley.  3.  Right carpal tunnel release, all through separate incisions.   OPERATING SURGEON:  Katy Fitch. Sypher, MD   ASSISTANT:  Annye Rusk PA-C.   ANESTHESIA:  General by LMA.   SUPERVISING ANESTHESIOLOGIST:  Quita Skye. Krista Blue, MD   INDICATIONS:  Kerrigan Gombos is a 54-year woman referred through the  courtesy of Dr. Oliver Barre for evaluation and management of the  aforementioned orthopedic predicaments.  Clinical examination had documented  a painful stenosing tenosynovitis of her right thumb.  She had an enlarging  volar ganglion adjacent the radial artery bifurcation and signs of chronic  median nerve entrapped neuropathy.   Electrodiagnostic studies at the office confirmed carpal tunnel syndrome.   She had failed nonoperative treatments.   She is brought to the operating this time, anticipating release of the  transverse carpal ligament, excision of the volar ganglion and release of  the thumb A1 pulley.   After informed consent, she is brought to the operating room at this time.   PROCEDURE:  Suhey Radford was brought to the operating room and placed  in  supine position upon the operating table.  Following the induction of  general anesthesia by LMA technique under the direct supervision of Dr. Adonis Huguenin, the right arm was prepped with Betadine soap and solution and  sterilely draped.   Following exsanguination of the limb with an Esmarch bandage, an arterial  tourniquet on the proximal brachium was inflated to 220 mmHg.  Procedure  commenced with a short transverse incision directly over the enlarged A1  pulley of the thumb.  The subcutaneous tissues were carefully divide, taking  care to retract the radial proper digital nerve.  The A1 pulley was isolated  and split with scissors.   Thereafter, free range of motion of the IP joint was recovered.   There wound was repaired with intradermal 3-0 Prolene.  Attention was then  directed to the mid-palm.  A short incision was fashioned in the line of the  ring finger.  Subcutaneous tissues were carefully divided, revealing the  palmar fascia.  This was split longitudinally to reveal the common extensor  branch of the median nerve.   These were followed back to the transverse carpal ligament, which  was gently  isolated from the median nerve proper.  The ligament was then released along  its ulnar border with scissors.  This widely opened the carpal canal.  No  mass or other predicaments were noted.   Bleeding points were electrocauterized with bipolar current followed by  repair of the skin with intradermal 3-0 Prolene suture.   Attention was then directed to the volar aspect of the wrist.  A transverse  incision was fashioned in the proximal wrist flexion crease.  Subcutaneous  tissues were carefully divided, taking care to identify a multilobular  ganglion that was intimately adherent to the wall of the radial artery.  The  adventitia of the radial artery was dissected and the ganglion  circumferentially separated.   The ganglion was followed to the radioscaphoid capitate ligament,  where it  appeared to be exiting from the wrist capsule.  Careful dissection distally  confirmed that there were no other extensions to the scaphotrapezial joint  or the thumb CMC joint.   Bleeding points were electrocauterized with bipolar current followed by  repair of the skin with intradermal 3-0 Prolene.  There were no apparent  complications.   For aftercare, Ms. Welte was placed in a compressive dressing.  She was  awakened from anesthesia and transferred to the recovery room with stable  signs.  She will be discharged home with a prescription for Percocet 5 mg  one or two tablets p.o. 4-6 hours p.r.n. pain, 20 tablets without refill.      Katy Fitch Sypher, M.D.  Electronically Signed     RVS/MEDQ  D:  11/29/2004  T:  11/29/2004  Job:  [id number]   cc:   Corwin Levins, M.D. [facility]  [address]  [place]  [place] [address]

## 2010-06-15 NOTE — Op Note (Signed)
[facility]  Patient:    Destiny Roth, Destiny Roth Visit Number: [id number] MRN: [id number]          Service Type: DSU Location: [facility] Attending Physician:  Pleas Koch Dictated by:   Georgina Peer, M.D. Proc. Date: 08/14/01 Admit Date:  08/14/2001 Discharge Date: 08/14/2001   CC:         Corwin Levins, M.D. [facility]   Operative Report  PREOPERATIVE DIAGNOSES:       1. Irregular bleeding.                               2. Endometrial mass.  POSTOPERATIVE DIAGNOSES:      1. Irregular bleeding.                               2. Endometrial mass.  OPERATION:                    Hysteroscopic resection of endometrial mass, dilation and curettage.  SURGEON:                      Georgina Peer, M.D.  ANESTHESIA:                   Monitored anesthesia care with IV sedation and 1% Xylocaine paracervical block.  ESTIMATED BLOOD LOSS:         Less than 50 cc.  FLUID DEFICIT:                290 cc.  FINDINGS:                     Mass in the endometrial cavity.  INDICATIONS:                  [age]-year-old female with irregular bleeding.  An ultrasound with saline infusion showed an endometrial mass with a central lucency.  She was brought in for resection of this mass.  DESCRIPTION OF PROCEDURE:     After informed consent, the patient was taken to the operating room and given IV sedation, placed in the dorsal lithotomy position.  The perineum, vagina and urethra were prepped and draped, and the bladder emptied with the catheter.  The uterus was normal size, mobile and anterior.  A speculum placed in the vagina visualized the cervix which was then injected with 10 cc of 1% Xylocaine plain in a paracervical fashion.  A tenaculum grasped the cervix.  The cervix was then progressively dilated to 8 [place] with Shawnie Pons dilators.  An operative hysteroscope was placed.  The endocervix appeared normal.  At the internal cervical os there was a  mass protruding through the internal os.  It was resected in stages with the bleeding controlled with cautery.  The entire endometrial mass appeared to be resected with the cavity free from the mass at the end of the procedure.  A curettage to remove any debris or other tissue was then accomplished.  The patient tolerated the procedure well and received Toradol intraoperatively. She was taken to the recovery area in good condition.  Pathology is pending.  INDICATIONS:  DESCRIPTION OF PROCEDURE: Dictated by:   Georgina Peer, M.D. Attending Physician:  Pleas Koch DD:  08/14/01 TD:  08/19/01 Job: [id number]

## 2010-06-18 ENCOUNTER — Encounter: Payer: Self-pay | Admitting: Internal Medicine

## 2010-06-20 ENCOUNTER — Telehealth: Payer: Self-pay

## 2010-06-20 NOTE — Telephone Encounter (Signed)
Also,  She should cont to take the actonel, unless she has come to the point where she has taken 5 yrs;  At that time she should probably stop the medication, due to recent reports of atypical fractures being associated with persons who take alendronate longer than 5 yrs

## 2010-06-20 NOTE — Telephone Encounter (Signed)
Pt called requesting results of her DXA scan done in April.

## 2010-06-20 NOTE — Telephone Encounter (Signed)
T-scores were -1.8 and -1.5 at the hips, indicating mild to mod osteopenia (not osteoporosis);  Ok to cont as is with calcium, vit D, exercise;  No need for further medication at this time

## 2010-06-21 ENCOUNTER — Telehealth: Payer: Self-pay

## 2010-06-21 NOTE — Telephone Encounter (Signed)
Pt advised in detail on home VM as requested. VM left at work number for call back if needed.

## 2010-06-21 NOTE — Telephone Encounter (Signed)
Pt advised of same.  

## 2010-06-21 NOTE — Telephone Encounter (Signed)
Results are at least as good if not better than previous  Ok to stop the [employer] to cont the calcium supp for now, as the calcium supp risk for persons is not strong, and many doctors are not changing their advice to stop calcium and vit d for osteoporosis prevention

## 2010-06-21 NOTE — Telephone Encounter (Signed)
Pt called back with follow up questions regarding DXA results: Pt would like to know if Osteopenia is any worse that it was on last exam and pt states that there has been a recent study that advises if you have cardiovascular risk factor you should not take calcium supplement. Pt is requesting MD advise on if she should/could continue with Calcium? Pt also wanted MD to be aware that she will stop taking Actonel as it had been more than 5 years that she has been taking this medicine.

## 2010-06-21 NOTE — Telephone Encounter (Signed)
Left message on machine for pt to return my call  

## 2010-10-30 ENCOUNTER — Other Ambulatory Visit (INDEPENDENT_AMBULATORY_CARE_PROVIDER_SITE_OTHER): Payer: 59

## 2010-10-30 DIAGNOSIS — Z0000 Encounter for general adult medical examination without abnormal findings: Secondary | ICD-10-CM

## 2010-10-30 LAB — HEPATIC FUNCTION PANEL
ALT: 22 U/L (ref 0–35)
AST: 22 U/L (ref 0–37)
Albumin: 4 g/dL (ref 3.5–5.2)
Alkaline Phosphatase: 57 U/L (ref 39–117)
Total Protein: 7.3 g/dL (ref 6.0–8.3)

## 2010-10-30 LAB — URINALYSIS, ROUTINE W REFLEX MICROSCOPIC
Ketones, ur: NEGATIVE
Leukocytes, UA: NEGATIVE
Nitrite: NEGATIVE
Specific Gravity, Urine: 1.025 (ref 1.000–1.030)
Urobilinogen, UA: 0.2 (ref 0.0–1.0)
pH: 6 (ref 5.0–8.0)

## 2010-10-30 LAB — CBC WITH DIFFERENTIAL/PLATELET
Basophils Absolute: 0 10*3/uL (ref 0.0–0.1)
Eosinophils Relative: 1.1 % (ref 0.0–5.0)
Lymphocytes Relative: 15.4 % (ref 12.0–46.0)
Lymphs Abs: 0.9 10*3/uL (ref 0.7–4.0)
Monocytes Relative: 8 % (ref 3.0–12.0)
Neutrophils Relative %: 75.3 % (ref 43.0–77.0)
Platelets: 351 10*3/uL (ref 150.0–400.0)
RDW: 12.2 % (ref 11.5–14.6)
WBC: 6.1 10*3/uL (ref 4.5–10.5)

## 2010-10-30 LAB — BASIC METABOLIC PANEL
BUN: 20 mg/dL (ref 6–23)
CO2: 31 mEq/L (ref 19–32)
Glucose, Bld: 99 mg/dL (ref 70–99)
Potassium: 3.7 mEq/L (ref 3.5–5.1)
Sodium: 141 mEq/L (ref 135–145)

## 2010-10-30 LAB — LIPID PANEL
Cholesterol: 171 mg/dL (ref 0–200)
VLDL: 35 mg/dL (ref 0.0–40.0)

## 2010-10-30 LAB — TSH: TSH: 0.79 u[IU]/mL (ref 0.35–5.50)

## 2010-11-04 ENCOUNTER — Encounter: Payer: Self-pay | Admitting: Internal Medicine

## 2010-11-04 DIAGNOSIS — R7302 Impaired glucose tolerance (oral): Secondary | ICD-10-CM

## 2010-11-04 HISTORY — DX: Impaired glucose tolerance (oral): R73.02

## 2010-11-06 ENCOUNTER — Ambulatory Visit: Payer: 59 | Admitting: Internal Medicine

## 2010-11-06 ENCOUNTER — Encounter: Payer: Self-pay | Admitting: Internal Medicine

## 2010-11-06 ENCOUNTER — Ambulatory Visit (INDEPENDENT_AMBULATORY_CARE_PROVIDER_SITE_OTHER): Payer: 59 | Admitting: Internal Medicine

## 2010-11-06 VITALS — BP 130/78 | HR 87 | Temp 98.5°F | Ht <= 58 in | Wt 127.0 lb

## 2010-11-06 DIAGNOSIS — R430 Anosmia: Secondary | ICD-10-CM

## 2010-11-06 DIAGNOSIS — M21619 Bunion of unspecified foot: Secondary | ICD-10-CM

## 2010-11-06 DIAGNOSIS — Z2911 Encounter for prophylactic immunotherapy for respiratory syncytial virus (RSV): Secondary | ICD-10-CM

## 2010-11-06 DIAGNOSIS — Z23 Encounter for immunization: Secondary | ICD-10-CM

## 2010-11-06 DIAGNOSIS — Z0000 Encounter for general adult medical examination without abnormal findings: Secondary | ICD-10-CM

## 2010-11-06 HISTORY — DX: Anosmia: R43.0

## 2010-11-06 MED ORDER — TETANUS-DIPHTH-ACELL PERTUSSIS 5-2.5-18.5 LF-MCG/0.5 IM SUSP
0.5000 mL | Freq: Once | INTRAMUSCULAR | Status: AC
  Administered 2010-11-06: 0.5 mL via INTRAMUSCULAR

## 2010-11-06 NOTE — Progress Notes (Signed)
Subjective:    Patient ID: Destiny Roth, female    DOB: October 09, 1950, 60 y.o.   MRN: [id number]  HPI  Here for wellness and f/u;  Overall doing ok;  Pt denies CP, worsening SOB, DOE, wheezing, orthopnea, PND, worsening LE edema, palpitations, dizziness or syncope.  Pt denies neurological change such as new Headache, facial or extremity weakness.  Pt denies polydipsia, polyuria, or low sugar symptoms. Pt states overall good compliance with treatment and medications, good tolerability, and trying to follow lower cholesterol diet.  Pt denies worsening depressive symptoms, suicidal ideation or panic. No fever, wt loss, night sweats, loss of appetite, or other constitutional symptoms.  Pt states good ability with ADL's, low fall risk, home safety reviewed and adequate, no significant changes in hearing or vision, and occasionally active with exercise.  Does have occasional hemorrhoidal activity after the IBS has acted up with diarrhea for a few days.   Past Medical History  Diagnosis Date  . GLUCOSE INTOLERANCE 05/24/2008  . HYPERLIPIDEMIA 09/23/2006  . ANXIETY 09/23/2006  . DEPRESSION 09/23/2006  . CARPAL TUNNEL SYNDROME, BILATERAL 09/23/2006  . HYPERTENSION 09/23/2006  . GERD 05/24/2008  . DIVERTICULOSIS, COLON 09/23/2006  . IBS 09/23/2006  . BUNION, RIGHT FOOT 09/18/2009  . OSTEOPOROSIS 05/24/2008  . Abdominal pain, epigastric 09/18/2009  . Impaired glucose tolerance 11/04/2010  . Anosmia 11/06/2010   Past Surgical History  Procedure Date  . Tubal ligation   . Nasal septum repair   . S/p ganglion cyst right wrist   . S/p bilat cts   . S/p right thumb trigger finger surgury   . S/p fibroid ablation     reports that she has never smoked. She does not have any smokeless tobacco history on file. She reports that she drinks alcohol. Her drug history not on file. family history includes Heart attack in her mother; Hypertension in her mother; and Peripheral vascular disease in her mother. Allergies    Allergen Reactions  . Ezetimibe-Simvastatin    Current Outpatient Prescriptions on File Prior to Visit  Medication Sig Dispense Refill  . aspirin 81 MG tablet Take 81 mg by mouth daily.        . calcium carbonate (OS-CAL) 600 MG TABS Take 600 mg by mouth daily.        Marland Kitchen diltiazem (CARDIZEM CD) 300 MG 24 hr capsule Take 300 mg by mouth daily.        . DULoxetine (CYMBALTA) 60 MG capsule Take 1 capsule (60 mg total) by mouth daily.  90 capsule  3  . Multiple Vitamin (MULTIVITAMIN PO) Take by mouth daily.        Marland Kitchen olmesartan-hydrochlorothiazide (BENICAR HCT) 40-25 MG per tablet Take 1 tablet by mouth daily.        . rosuvastatin (CRESTOR) 40 MG tablet Take 40 mg by mouth. Take 1/2 tablet once daily       . estradiol (ESTRING) 2 MG vaginal ring Place 2 mg vaginally every 3 (three) months. follow package directions       . risedronate (ACTONEL) 150 MG tablet Take 150 mg by mouth every 30 (thirty) days. with water on empty stomach, nothing by mouth or lie down for next 30 minutes.        Review of Systems Review of Systems  Constitutional: Negative for diaphoresis, activity change, appetite change and unexpected weight change.  HENT: Negative for hearing loss, ear pain, facial swelling, mouth sores and neck stiffness.   Eyes: Negative for pain, redness and  visual disturbance.  Respiratory: Negative for shortness of breath and wheezing.   Cardiovascular: Negative for chest pain and palpitations.  Gastrointestinal: Negative for diarrhea, blood in stool, abdominal distention and rectal pain.  Genitourinary: Negative for hematuria, flank pain and decreased urine volume.  Musculoskeletal: Negative for myalgias and joint swelling.  Skin: Negative for color change and wound.  Neurological: Negative for syncope and numbness.  Hematological: Negative for adenopathy.  Psychiatric/Behavioral: Negative for hallucinations, self-injury, decreased concentration and agitation.     Objective:   Physical  Exam BP 130/78  Pulse 87  Temp(Src) 98.5 F (36.9 C) (Oral)  Ht 4' 9.5" (1.461 m)  Wt 127 lb (57.607 kg)  BMI 27.01 kg/m2  SpO2 94% Physical Exam  VS noted Constitutional: Pt is oriented to person, place, and time. Appears well-developed and well-nourished.  HENT:  Head: Normocephalic and atraumatic.  Right Ear: External ear normal.  Left Ear: External ear normal.  Nose: Nose normal.  Mouth/Throat: Oropharynx is clear and moist.  Eyes: Conjunctivae and EOM are normal. Pupils are equal, round, and reactive to light.  Neck: Normal range of motion. Neck supple. No JVD present. No tracheal deviation present.  Cardiovascular: Normal rate, regular rhythm, normal heart sounds and intact distal pulses.   Pulmonary/Chest: Effort normal and breath sounds normal.  Abdominal: Soft. Bowel sounds are normal. There is no tenderness.  Musculoskeletal: Normal range of motion. Exhibits no edema.  Lymphadenopathy:  Has no cervical adenopathy.  Neurological: Pt is alert and oriented to person, place, and time. Pt has normal reflexes. No cranial nerve deficit.  Skin: Skin is warm and dry. No rash noted.  Psychiatric:  Has  normal mood and affect. Behavior is normal.     Assessment & Plan:

## 2010-11-06 NOTE — Patient Instructions (Addendum)
You had the tetanus and shingles shots today Continue all other medications as before You will be contacted regarding the referral for: podiatry - Dr Erie Noe are given the refills today to the pharmacy Please return in 1 year for your yearly visit, or sooner if needed, with Lab testing done 3-5 days before

## 2010-11-06 NOTE — Assessment & Plan Note (Addendum)
Overall doing well, age appropriate education and counseling updated, referrals for preventative services and immunizations addressed, dietary and smoking counseling addressed, most recent labs and ECG reviewed.  I have personally reviewed and have noted: 1) the patient's medical and social history 2) The pt's use of alcohol, tobacco, and illicit drugs 3) The patient's current medications and supplements 4) Functional ability including ADL's, fall risk, home safety risk, hearing and visual impairment 5) Diet and physical activities 6) Evidence for depression or mood disorder 7) The patient's height, weight, and BMI have been recorded in the chart I have made referrals, and provided counseling and education based on review of the above ECG reviewed as per emr, for tetanus and shingles today

## 2010-11-11 ENCOUNTER — Encounter: Payer: Self-pay | Admitting: Internal Medicine

## 2010-11-11 NOTE — Assessment & Plan Note (Signed)
Mild symptoms - for podiatry referral

## 2010-11-23 ENCOUNTER — Other Ambulatory Visit: Payer: Self-pay | Admitting: Internal Medicine

## 2011-04-22 ENCOUNTER — Telehealth: Payer: Self-pay

## 2011-04-22 MED ORDER — CEPHALEXIN 500 MG PO CAPS: 500.0000 mg | ORAL_CAPSULE | Freq: Four times a day (QID) | ORAL | Status: AC

## 2011-04-22 NOTE — Telephone Encounter (Signed)
Called the patient at work left detailed message prescription requested is at her pharmacy.

## 2011-04-22 NOTE — Telephone Encounter (Signed)
Ok for tx this time - done per [employer]

## 2011-04-22 NOTE — Telephone Encounter (Signed)
Pt called c/o urinary frequency, urgency, and lower back pain. Pt is going on a cruise this weekend is currently at work [employer]) and does not think she would have time to come in for OV or UA, please advise, pt is requesting ABX to treat.

## 2011-05-24 ENCOUNTER — Other Ambulatory Visit: Payer: Self-pay | Admitting: Internal Medicine

## 2011-10-21 ENCOUNTER — Encounter (INDEPENDENT_AMBULATORY_CARE_PROVIDER_SITE_OTHER): Payer: 59 | Admitting: Ophthalmology

## 2011-10-21 DIAGNOSIS — H35039 Hypertensive retinopathy, unspecified eye: Secondary | ICD-10-CM

## 2011-10-21 DIAGNOSIS — H35349 Macular cyst, hole, or pseudohole, unspecified eye: Secondary | ICD-10-CM

## 2011-10-21 DIAGNOSIS — H35379 Puckering of macula, unspecified eye: Secondary | ICD-10-CM

## 2011-10-21 DIAGNOSIS — I10 Essential (primary) hypertension: Secondary | ICD-10-CM

## 2011-10-21 DIAGNOSIS — H3530 Unspecified macular degeneration: Secondary | ICD-10-CM

## 2011-11-08 ENCOUNTER — Other Ambulatory Visit (INDEPENDENT_AMBULATORY_CARE_PROVIDER_SITE_OTHER): Payer: 59

## 2011-11-08 ENCOUNTER — Ambulatory Visit (INDEPENDENT_AMBULATORY_CARE_PROVIDER_SITE_OTHER): Payer: 59 | Admitting: Internal Medicine

## 2011-11-08 ENCOUNTER — Encounter: Payer: Self-pay | Admitting: Internal Medicine

## 2011-11-08 VITALS — BP 112/76 | HR 95 | Temp 98.7°F | Ht <= 58 in | Wt 126.0 lb

## 2011-11-08 DIAGNOSIS — K589 Irritable bowel syndrome without diarrhea: Secondary | ICD-10-CM

## 2011-11-08 DIAGNOSIS — D126 Benign neoplasm of colon, unspecified: Secondary | ICD-10-CM

## 2011-11-08 DIAGNOSIS — K635 Polyp of colon: Secondary | ICD-10-CM

## 2011-11-08 DIAGNOSIS — R7309 Other abnormal glucose: Secondary | ICD-10-CM

## 2011-11-08 DIAGNOSIS — K921 Melena: Secondary | ICD-10-CM

## 2011-11-08 DIAGNOSIS — Z0000 Encounter for general adult medical examination without abnormal findings: Secondary | ICD-10-CM

## 2011-11-08 DIAGNOSIS — R251 Tremor, unspecified: Secondary | ICD-10-CM

## 2011-11-08 DIAGNOSIS — R259 Unspecified abnormal involuntary movements: Secondary | ICD-10-CM

## 2011-11-08 DIAGNOSIS — R7302 Impaired glucose tolerance (oral): Secondary | ICD-10-CM

## 2011-11-08 DIAGNOSIS — F411 Generalized anxiety disorder: Secondary | ICD-10-CM

## 2011-11-08 LAB — BASIC METABOLIC PANEL
Calcium: 9.6 mg/dL (ref 8.4–10.5)
GFR: 69.87 mL/min (ref >60.00)
Potassium: 3.2 mEq/L — ABNORMAL LOW (ref 3.5–5.1)
Sodium: 139 mEq/L (ref 135–145)

## 2011-11-08 LAB — CBC WITH DIFFERENTIAL/PLATELET
Eosinophils Relative: 0.8 % (ref 0.0–5.0)
HCT: 43.9 % (ref 36.0–46.0)
Hemoglobin: 14.4 g/dL (ref 12.0–15.0)
Lymphs Abs: 1.2 10*3/uL (ref 0.7–4.0)
MCV: 90.5 fl (ref 78.0–100.0)
Monocytes Absolute: 0.4 10*3/uL (ref 0.1–1.0)
Monocytes Relative: 6.9 % (ref 3.0–12.0)
Neutro Abs: 4.7 10*3/uL (ref 1.4–7.7)
Platelets: 361 10*3/uL (ref 150.0–400.0)
RDW: 12.2 % (ref 11.5–14.6)
WBC: 6.4 10*3/uL (ref 4.5–10.5)

## 2011-11-08 LAB — HEPATIC FUNCTION PANEL
ALT: 23 U/L (ref 0–35)
AST: 24 U/L (ref 0–37)
Albumin: 4.2 g/dL (ref 3.5–5.2)

## 2011-11-08 LAB — URINALYSIS, ROUTINE W REFLEX MICROSCOPIC
Bilirubin Urine: NEGATIVE
Hgb urine dipstick: NEGATIVE
Nitrite: NEGATIVE
Total Protein, Urine: NEGATIVE
Urobilinogen, UA: 0.2 (ref 0.0–1.0)

## 2011-11-08 LAB — LIPID PANEL
Cholesterol: 159 mg/dL (ref 0–200)
HDL: 55.3 mg/dL (ref >39.00)
Triglycerides: 210 mg/dL — ABNORMAL HIGH (ref 0.0–149.0)
VLDL: 42 mg/dL — ABNORMAL HIGH (ref 0.0–40.0)

## 2011-11-08 LAB — TSH: TSH: 1.19 u[IU]/mL (ref 0.35–5.50)

## 2011-11-08 MED ORDER — CLONAZEPAM 0.5 MG PO TABS: 0.5000 mg | ORAL_TABLET | Freq: Two times a day (BID) | ORAL | Status: DC | PRN

## 2011-11-08 MED ORDER — DULOXETINE HCL 60 MG PO CPEP: 60.0000 mg | ORAL_CAPSULE | Freq: Every day | ORAL | Status: DC

## 2011-11-08 MED ORDER — ROSUVASTATIN CALCIUM 40 MG PO TABS: 20.0000 mg | ORAL_TABLET | Freq: Every day | ORAL | Status: DC

## 2011-11-08 MED ORDER — OLMESARTAN MEDOXOMIL-HCTZ 40-25 MG PO TABS: 1.0000 | ORAL_TABLET | Freq: Every day | ORAL | Status: DC

## 2011-11-08 MED ORDER — DILTIAZEM HCL ER COATED BEADS 300 MG PO CP24: 300.0000 mg | ORAL_CAPSULE | Freq: Every day | ORAL | Status: DC

## 2011-11-08 NOTE — Patient Instructions (Addendum)
Take all new medications as prescribed - the klonopin as needed Continue all other medications as before; all of your refills were sent to the pharmacy today Please have the pharmacy call with any other refills you may need You will be contacted regarding the referral for: colonoscopy, and Neurology Please go to LAB in the Basement for the blood and/or urine tests to be done today You will be contacted by phone if any changes need to be made immediately.  Otherwise, you will receive a letter about your results with an explanation. Please remember to check My Chart, as this will be important to you in the future with finding out test results. Please return in 1 year for your yearly visit, or sooner if needed, with Lab testing done 3-5 days before

## 2011-11-08 NOTE — Assessment & Plan Note (Signed)
?   Anxiety vs essential vs parkinons - for klonopin trial, and refer neurology

## 2011-11-09 ENCOUNTER — Encounter: Payer: Self-pay | Admitting: Internal Medicine

## 2011-11-09 DIAGNOSIS — K589 Irritable bowel syndrome without diarrhea: Secondary | ICD-10-CM | POA: Insufficient documentation

## 2011-11-09 DIAGNOSIS — K635 Polyp of colon: Secondary | ICD-10-CM | POA: Insufficient documentation

## 2011-11-09 DIAGNOSIS — K921 Melena: Secondary | ICD-10-CM | POA: Insufficient documentation

## 2011-11-09 NOTE — Progress Notes (Signed)
Subjective:    Patient ID: Destiny Roth, female    DOB: Aug 11, 1950, 61 y.o.   MRN: [id number]  HPI  Here for wellness and f/u;  Overall doing ok;  Pt denies CP, worsening SOB, DOE, wheezing, orthopnea, PND, worsening LE edema, palpitations, dizziness or syncope.  Pt denies neurological change such as new Headache, facial or extremity weakness.  Pt denies polydipsia, polyuria, or low sugar symptoms. Pt states overall good compliance with treatment and medications, good tolerability, and trying to follow lower cholesterol diet.  Pt denies worsening depressive symptoms, suicidal ideation or panic. No fever, wt loss, night sweats, loss of appetite, or other constitutional symptoms.  Pt states good ability with ADL's, low fall risk, home safety reviewed and adequate, no significant changes in hearing or vision, and less active with exercise this past yr.  Also with ? Mild new tremor vs anxiety related tremulousness (or both?) with worse related stress over the past 2-3 mo.  Tremor most to bilat distal LE's, chin and hands.  Writing more difficult.  Family has remarked on her slower movements lately.  Also with hx of colon polyp, recent small volume BRBPR without pain, and hx of IBS. Has seen non[facility] in the past, but would like to transfer care to [facility]. Past Medical History  Diagnosis Date  . GLUCOSE INTOLERANCE 05/24/2008  . HYPERLIPIDEMIA 09/23/2006  . ANXIETY 09/23/2006  . DEPRESSION 09/23/2006  . CARPAL TUNNEL SYNDROME, BILATERAL 09/23/2006  . HYPERTENSION 09/23/2006  . GERD 05/24/2008  . DIVERTICULOSIS, COLON 09/23/2006  . IBS 09/23/2006  . BUNION, RIGHT FOOT 09/18/2009  . OSTEOPOROSIS 05/24/2008  . Abdominal pain, epigastric 09/18/2009  . Impaired glucose tolerance 11/04/2010  . Anosmia 11/06/2010   Past Surgical History  Procedure Date  . Tubal ligation   . Nasal septum repair   . S/p ganglion cyst right wrist   . S/p bilat cts   . S/p right thumb trigger finger surgury   . S/p  fibroid ablation     reports that she has never smoked. She does not have any smokeless tobacco history on file. She reports that she drinks alcohol. Her drug history not on file. family history includes Heart attack in her mother; Hypertension in her mother; and Peripheral vascular disease in her mother. Allergies  Allergen Reactions  . Ezetimibe-Simvastatin    Current Outpatient Prescriptions on File Prior to Visit  Medication Sig Dispense Refill  . ascorbic acid (VITAMIN C) 500 MG tablet Take 500 mg by mouth daily.        Marland Kitchen aspirin 81 MG tablet Take 81 mg by mouth daily.        . calcium carbonate (OS-CAL) 600 MG TABS Take 600 mg by mouth daily.        Marland Kitchen diltiazem (CARDIZEM CD) 300 MG 24 hr capsule Take 1 capsule (300 mg total) by mouth daily.  90 capsule  3  . DULoxetine (CYMBALTA) 60 MG capsule Take 1 capsule (60 mg total) by mouth daily.  90 capsule  3  . Multiple Vitamin (MULTIVITAMIN PO) Take by mouth daily.        Marland Kitchen olmesartan-hydrochlorothiazide (BENICAR HCT) 40-25 MG per tablet Take 1 tablet by mouth daily.  90 tablet  3  . rosuvastatin (CRESTOR) 40 MG tablet Take 0.5 tablets (20 mg total) by mouth daily.  45 tablet  3  . clonazePAM (KLONOPIN) 0.5 MG tablet Take 1 tablet (0.5 mg total) by mouth 2 (two) times daily as needed for anxiety.  60 tablet  2  . Cyanocobalamin (VITAMIN B 12 PO) Take by mouth daily.         Review of Systems Review of Systems  Constitutional: Negative for diaphoresis, activity change, appetite change and unexpected weight change.  HENT: Negative for hearing loss, ear pain, facial swelling, mouth sores and neck stiffness.   Eyes: Negative for pain, redness and visual disturbance.  Respiratory: Negative for shortness of breath and wheezing.   Cardiovascular: Negative for chest pain and palpitations.  Gastrointestinal: Negative for diarrhea, blood in stool, abdominal distention and rectal pain.  Genitourinary: Negative for hematuria, flank pain and  decreased urine volume.  Musculoskeletal: Negative for myalgias and joint swelling.  Skin: Negative for color change and wound.  Neurological: Negative for syncope and numbness.  Hematological: Negative for adenopathy.  Psychiatric/Behavioral: Negative for hallucinations, self-injury, decreased concentration and agitation.      Objective:   Physical Exam BP 112/76  Pulse 95  Temp 98.7 F (37.1 C) (Oral)  Ht 4' 9.5" (1.461 m)  Wt 126 lb (57.153 kg)  BMI 26.79 kg/m2  SpO2 96% Physical Exam  VS noted Constitutional: Pt is oriented to person, place, and time. Appears well-developed and well-nourished.  HENT:  Head: Normocephalic and atraumatic.  Right Ear: External ear normal.  Left Ear: External ear normal.  Nose: Nose normal.  Mouth/Throat: Oropharynx is clear and moist.  Eyes: Conjunctivae and EOM are normal. Pupils are equal, round, and reactive to light.  Neck: Normal range of motion. Neck supple. No JVD present. No tracheal deviation present.  Cardiovascular: Normal rate, regular rhythm, normal heart sounds and intact distal pulses.   Pulmonary/Chest: Effort normal and breath sounds normal.  Abdominal: Soft. Bowel sounds are normal. There is no tenderness.  Musculoskeletal: Normal range of motion. Exhibits no edema.  Lymphadenopathy:  Has no cervical adenopathy.  Neurological: Pt is alert and oriented to person, place, and time. Pt has normal reflexes. No cranial nerve deficit. , ? Mild tremor today, motor/gait/dtr intact Skin: Skin is warm and dry. No rash noted.  Psychiatric:  Has  normal mood and affect. Behavior is normal.     Assessment & Plan:

## 2011-11-09 NOTE — Assessment & Plan Note (Signed)
stable overall by hx and exam, most recent data reviewed with pt, and pt to continue medical treatment as before Lab Results  Component Value Date   WBC 6.1 10/30/2010   HGB 13.9 10/30/2010   HCT 41.1 10/30/2010   PLT 351.0 10/30/2010   GLUCOSE 99 10/30/2010   CHOL 171 10/30/2010   TRIG 175.0* 10/30/2010   HDL 58.00 10/30/2010   LDLDIRECT 86.0 10/23/2006   LDLCALC 78 10/30/2010   ALT 22 10/30/2010   AST 22 10/30/2010   NA 141 10/30/2010   K 3.7 10/30/2010   CL 103 10/30/2010   CREATININE 0.9 10/30/2010   BUN 20 10/30/2010   CO2 31 10/30/2010   TSH 0.79 10/30/2010

## 2011-11-09 NOTE — Assessment & Plan Note (Signed)
For colonoscopy 

## 2011-11-09 NOTE — Assessment & Plan Note (Signed)

## 2011-11-09 NOTE — Assessment & Plan Note (Signed)
Situational worsening, for klonopin prn,  to f/u any worsening symptoms or concerns

## 2011-11-11 ENCOUNTER — Other Ambulatory Visit: Payer: Self-pay | Admitting: Internal Medicine

## 2011-11-11 MED ORDER — POTASSIUM CHLORIDE ER 10 MEQ PO TBCR: 10.0000 meq | EXTENDED_RELEASE_TABLET | Freq: Every day | ORAL | Status: DC

## 2011-11-12 ENCOUNTER — Encounter: Payer: Self-pay | Admitting: Internal Medicine

## 2011-11-26 ENCOUNTER — Encounter: Payer: Self-pay | Admitting: Neurology

## 2011-11-26 ENCOUNTER — Ambulatory Visit (INDEPENDENT_AMBULATORY_CARE_PROVIDER_SITE_OTHER): Payer: 59 | Admitting: Neurology

## 2011-11-26 VITALS — BP 124/76 | HR 90 | Temp 98.1°F | Resp 16 | Ht <= 58 in | Wt 126.0 lb

## 2011-11-26 DIAGNOSIS — R292 Abnormal reflex: Secondary | ICD-10-CM

## 2011-11-26 DIAGNOSIS — G20 Parkinson's disease: Secondary | ICD-10-CM

## 2011-11-26 LAB — RPR

## 2011-11-26 LAB — FOLATE: Folate: 13.5 ng/mL (ref >5.9)

## 2011-11-26 MED ORDER — PRAMIPEXOLE DIHYDROCHLORIDE ER 1.5 MG PO TB24: 1.5000 mg | ORAL_TABLET | Freq: Every day | ORAL | Status: DC

## 2011-11-26 MED ORDER — PRAMIPEXOLE DIHYDROCHLORIDE ER 0.375 MG PO TB24: ORAL_TABLET | ORAL | Status: DC

## 2011-11-26 NOTE — Patient Instructions (Addendum)
1.  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  We will get your MRI scheduled 3.  I will see you in January, call me if you need me before that 4.  EXERCISE  Your MRI is scheduled at [facility] located at [facility] in [place] on Thursday, October 31st at 1:00 pm.  Please arrive 30 minutes prior to your scheduled appointment time. Also, please call them to answer required medical questions before your scheduled appointment.    [phone].   Please take your Mirapex ER starting at 0.375mg  daily for one week then increase to two daily for a week.  You will then start the 1.5mg  thereafter.

## 2011-11-26 NOTE — Progress Notes (Signed)
Subjective:   Destiny Roth was seen in consultation in the movement disorder clinic at the request of Oliver Barre, MD.  The evaluation is for tremor.  The patient is a 61 y.o. right handed female with a history of tremor.Onset of symptoms was gradual, starting about 1 year ago. Tremor seems to involve the entire body.  She feels it interally and externally and does not seem to just involve one side.  She sometimes feels nervous on the inside.  Her husband has noticed the tremor. Her daughter noted slowness of movement (said something to the pt about how long it takes her to buckle her seat belt) and has more trouble with fine motor coordination with hands (not related at all to CTR).   She feel her hands are "stiff and clumsy."   She has trouble applying makeup because her hand coordination is poor. She notices tremor both at rest and with activation.  She can bring down the tremor if she focuses on it but it comes back when she isn't paying attention.  She only loses balance if she gets up too quickly and she is dizzy.     She has noted a gradual loss of smell and taste over the last 20 years.  She even had a rhinoplasty and septoplasty in 1993 to see if that would help and it did not.  She can smell almost nothing now.    She snores but she sleeps well.  She does not dream vividly.  There have been no hallucinations or visual distortions.  She has no drooling.  She has urinary incontinence if she laughs/cougs/sneezes.  She has not noted significant change in her voice.  Her handwriting is troublesome because of tremor but has not noted micrographia.  She has had no diplopia.  She has had visual change and had to have her contacts adjusted, and was recently dx with retinal "hole."  She has noted no change with caffeine.  She does not know effect of EtOH on tremor.    There is no family history of tremor.  Her maternal GM did have PD.  She was started on Klonopin on 11/08/11.  She took it one  time, it made her a little tired and she d/c it, waiting until this visit.   Current/Previously tried tremor medications: Klonopin  Current medications that may exacerbate tremor:  ?Cymbalta.   No Active Allergies  Current Outpatient Prescriptions on File Prior to Visit  Medication Sig Dispense Refill  . ascorbic acid (VITAMIN C) 500 MG tablet Take 500 mg by mouth daily.        Marland Kitchen aspirin 81 MG tablet Take 81 mg by mouth daily.        . calcium carbonate (OS-CAL) 600 MG TABS Take 600 mg by mouth daily.        . clonazePAM (KLONOPIN) 0.5 MG tablet Take 1 tablet (0.5 mg total) by mouth 2 (two) times daily as needed for anxiety.  60 tablet  2  . diltiazem (CARDIZEM CD) 300 MG 24 hr capsule Take 1 capsule (300 mg total) by mouth daily.  90 capsule  3  . DULoxetine (CYMBALTA) 60 MG capsule Take 1 capsule (60 mg total) by mouth daily.  90 capsule  3  . fish oil-omega-3 fatty acids 1000 MG capsule Take 2 g by mouth daily.      . Multiple Vitamin (MULTIVITAMIN PO) Take by mouth daily.        Marland Kitchen olmesartan-hydrochlorothiazide (BENICAR HCT) 40-25  MG per tablet Take 1 tablet by mouth daily.  90 tablet  3  . potassium chloride (KLOR-CON 10) 10 MEQ tablet Take 1 tablet (10 mEq total) by mouth daily.  90 tablet  3  . rosuvastatin (CRESTOR) 40 MG tablet Take 0.5 tablets (20 mg total) by mouth daily.  45 tablet  3  . Cyanocobalamin (VITAMIN B 12 PO) Take by mouth daily.        . Pramipexole Dihydrochloride (MIRAPEX ER) 1.5 MG TB24 Take 1 tablet (1.5 mg total) by mouth daily.  30 tablet  4    Past Medical History  Diagnosis Date  . GLUCOSE INTOLERANCE 05/24/2008  . HYPERLIPIDEMIA 09/23/2006  . ANXIETY 09/23/2006  . DEPRESSION 09/23/2006  . CARPAL TUNNEL SYNDROME, BILATERAL 09/23/2006  . HYPERTENSION 09/23/2006  . GERD 05/24/2008  . DIVERTICULOSIS, COLON 09/23/2006  . IBS 09/23/2006  . BUNION, RIGHT FOOT 09/18/2009  . OSTEOPOROSIS 05/24/2008  . Abdominal pain, epigastric 09/18/2009  . Impaired glucose  tolerance 11/04/2010  . Anosmia 11/06/2010    Past Surgical History  Procedure Date  . Tubal ligation   . Nasal septum repair   . S/p ganglion cyst right wrist   . S/p bilat cts   . S/p right thumb trigger finger surgury   . S/p fibroid ablation     History   Social History  . Marital Status: Married    Spouse Name: N/A    Number of Children: 2  . Years of Education: N/A   Occupational History  . Admissions supervisor [employer]   Social History Main Topics  . Smoking status: Never Smoker   . Smokeless tobacco: Not on file  . Alcohol Use: Yes     rare  . Drug Use: No  . Sexually Active: Not on file   Other Topics Concern  . Not on file   Social History Narrative  . No narrative on file    Family Status  Relation Status Death Age  . Father Deceased 46    MI, hypertension since teens  . Mother Deceased     July 12, 2001, PVD, [place], [place]  . Brother Alive     3, HTN  . Child Alive     2, alive and well  . Maternal Grandmother Deceased     PD    Review of Systems Intermittent diarrhea and constipation with IBS.  A complete 10 system ROS was obtained and was negative apart from what is mentioned.   Objective:   VITALS:   Filed Vitals:   11/26/11 0916  BP: 124/76  Pulse: 90  Temp: 98.1 F (36.7 C)  Resp: 16  Height: 4\' 9"  (1.448 m)  Weight: 126 lb (57.153 kg)   GEN:  The patient appears stated age and is in NAD. HEENT:  Normocephalic, atraumatic.  The mucous membranes are moist. The superficial temporal arteries are without ropiness or tenderness. CV:  RRR Lungs:  CTAB Neck/HEME:  There are no carotid bruits bilaterally.  Neurological examination:  Orientation: The patient is alert and oriented x3. Fund of knowledge is appropriate.  Recent and remote memory are intact.  Attention and concentration are normal.    Able to name objects and repeat phrases. Cranial nerves: There is good facial symmetry.  There is slight facial hypomimia with decreased blink  (Stellwag's sign).  Pupils are equal round and reactive to light bilaterally.  Fundoscopic exam is attempted but the disc margins are not well visualized bilaterally.. Extraocular muscles are intact.  There are  no square wave jerks. The visual fields are full to confrontational testing. The speech is fluent and clear. Soft palate rises symmetrically and there is no tongue deviation. Hearing is intact to conversational tone. Sensation: Sensation is intact to light and pinprick throughout (facial, trunk, extremities). Vibration is intact at the bilateral big toe. There is no extinction with double simultaneous stimulation. There is no sensory dermatomal level identified. Motor: Strength is 5/5 in the bilateral upper and lower extremities.   Shoulder shrug is equal and symmetric.  There is no pronator drift. Deep tendon reflexes: Deep tendon reflexes are 3+/4 at the bilateral biceps, triceps, brachioradialis, patella and achilles. Plantar responses are downgoing bilaterally.  Movement examination: Tone: There is moderately increased tone in the left upper extremities.  There is mild increased tone in the right upper extremity.  Tone increases with activation procedures.  Abnormal movements: There is very minimal tremor at rest that is noted most predominately on the left.  The left hand is held in a stiff posture.  There is no tremor with activation or intention.  She has mild difficulty with Archimedes spirals.  She writes a sentence without significant trouble. Coordination:  There is markedly decreased decremation with RAM's.  The left hemibody is worse than the right.  She has decremation with rapid alternating movements of all variety, including hand opening and closing, finger taps, toe taps, heel taps. Gait and Station: The patient has no difficulty arising out of a deep-seated chair without the use of the hands. The patient's stride length is mildly decreased.  The patient has a positive pull test.     She is unstable with turns.  There is decreased arm swing with ambulation, especially on the left.  LABS:  Lab Results  Component Value Date   WBC 6.4 11/08/2011   HGB 14.4 11/08/2011   HCT 43.9 11/08/2011   MCV 90.5 11/08/2011   PLT 361.0 11/08/2011     Chemistry      Component Value Date/Time   NA 139 11/08/2011 1600   K 3.2* 11/08/2011 1600   CL 100 11/08/2011 1600   CO2 28 11/08/2011 1600   BUN 15 11/08/2011 1600   CREATININE 0.9 11/08/2011 1600      Component Value Date/Time   CALCIUM 9.6 11/08/2011 1600   ALKPHOS 60 11/08/2011 1600   AST 24 11/08/2011 1600   ALT 23 11/08/2011 1600   BILITOT 0.6 11/08/2011 1600     No results found for this basename: VITAMINB12   No results found for this basename: FOLATE   Lab Results  Component Value Date   TSH 1.19 11/08/2011        Assessment:   1.  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   Plan:   1.  I spent greater than 50% of this 80 min visit in counseling with the patient.  We discussed the diagnosis as well as pathophysiology of the disease.  We discussed treatment options as well as prognostic indicators.  Patient education was provided. 2.  I invited the patient to our PD 101 class that we will be giving on November 14. 3.  The patient and I discussed the value of moderate cardiovascular exercise. 4.  End point to do an MRI of the brain given the patient's rather significant hyperreflexia. 5.  I did tell the patient that it is common to have loss of smell and taste with  this disorder.  She also understands that with treatment, this will not get better. 6.  We are going to initiate Mirapex ER, 0.375 mg and will slowly work our way up to 1.5 mg daily.  She will let me know about any side effects including sleep attacks that compulsive behaviors.  I asked her to monitor driving as we increase the  medication.  Risks, benefits, side effects and alternative therapies were discussed.  The opportunity to ask questions was given and they were answered to the best of my ability.  The patient expressed understanding and willingness to follow the outlined treatment protocols. 7.  She will have a B12 folate and RPR. 8.  We talked about the fact that Cymbalta could potentially increase tremor, but this certainly would not account for the rigidity and postural instability that she has.  For now, I am not going to change her Cymbalta. 9.  Return in about 2 months (around 02/03/2012).

## 2011-11-28 ENCOUNTER — Ambulatory Visit
Admission: RE | Admit: 2011-11-28 | Discharge: 2011-11-28 | Disposition: A | Discharge status: Home / Self Care | Payer: 59 | Source: Ambulatory Visit | Attending: Neurology | Admitting: Neurology

## 2011-11-28 ENCOUNTER — Other Ambulatory Visit: Payer: 59

## 2011-11-28 DIAGNOSIS — R292 Abnormal reflex: Secondary | ICD-10-CM

## 2011-11-28 DIAGNOSIS — G20 Parkinson's disease: Secondary | ICD-10-CM

## 2011-11-28 MED ORDER — GADOBENATE DIMEGLUMINE 529 MG/ML IV SOLN
11.0000 mL | Freq: Once | INTRAVENOUS | Status: AC | PRN
  Administered 2011-11-28: 11 mL via INTRAVENOUS

## 2011-12-02 ENCOUNTER — Other Ambulatory Visit (INDEPENDENT_AMBULATORY_CARE_PROVIDER_SITE_OTHER): Payer: 59

## 2011-12-02 ENCOUNTER — Ambulatory Visit (INDEPENDENT_AMBULATORY_CARE_PROVIDER_SITE_OTHER): Payer: 59 | Admitting: Internal Medicine

## 2011-12-02 ENCOUNTER — Encounter: Payer: Self-pay | Admitting: Internal Medicine

## 2011-12-02 VITALS — BP 138/82 | HR 100 | Ht <= 58 in | Wt 125.2 lb

## 2011-12-02 DIAGNOSIS — R197 Diarrhea, unspecified: Secondary | ICD-10-CM

## 2011-12-02 DIAGNOSIS — K625 Hemorrhage of anus and rectum: Secondary | ICD-10-CM

## 2011-12-02 DIAGNOSIS — K648 Other hemorrhoids: Secondary | ICD-10-CM

## 2011-12-02 DIAGNOSIS — R32 Unspecified urinary incontinence: Secondary | ICD-10-CM

## 2011-12-02 DIAGNOSIS — R159 Full incontinence of feces: Secondary | ICD-10-CM

## 2011-12-02 DIAGNOSIS — K589 Irritable bowel syndrome without diarrhea: Secondary | ICD-10-CM

## 2011-12-02 MED ORDER — HYDROCORTISONE ACETATE 25 MG RE SUPP: 25.0000 mg | Freq: Every day | RECTAL | Status: DC

## 2011-12-02 MED ORDER — DICYCLOMINE HCL 20 MG PO TABS: 20.0000 mg | ORAL_TABLET | Freq: Four times a day (QID) | ORAL | Status: DC | PRN

## 2011-12-02 NOTE — Progress Notes (Signed)
Subjective:    Patient ID: Destiny Roth, female    DOB: 01/03/1951, 61 y.o.   MRN: [id number]  HPI This is a very pleasant married white woman here to discuss rectal bleeding and IBS problems. She was previously evaluated by Dr. Loreta Ave using an upper endoscopy and a colonoscopy. She had a small duodenal ulcer, she had mild diverticulosis and hemorrhoids. This was in 2008, random colon biopsies were normal. She had a prominent fold in the anorectal area and showed some fibrosis and inflammation. She did not have followup after that.  She has a chronic greater than 10 year history of episodic multiple stools, and start out formed and then become loose. There is associated with urgency and some incontinence. Currently they seem to be a problem mostly on the weekends. They are not nocturnal. 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 During the week it doesn't seem to bother her but she feels like things to affect her on the weekends. She has not noted specific foods and give her problems overall. She does take Benicar, but her symptoms predate that. He does not really have problems with constipation except on rare occasions.  After she has multiple bowel movements she will have treating hemorrhoids that bleed. There is bright red blood per rectum. She has not been anemic. There is mild crampy abdominal pain, nothing severe. She has not tried over-the-counter agents. He does have bloating and flatulence and gas symptoms as well.  Her very recent history is notable for a new diagnosis of probable Parkinson's disease.  No Known Allergies Outpatient Prescriptions Prior to Visit  Medication Sig Dispense Refill  . ascorbic acid (VITAMIN C) 500 MG tablet Take 500 mg by mouth daily.        Marland Kitchen aspirin 81 MG tablet Take 81 mg by mouth daily.        . calcium carbonate (OS-CAL) 600 MG TABS Take 600 mg by mouth daily.        Marland Kitchen diltiazem (CARDIZEM CD) 300 MG 24 hr capsule Take 1 capsule  (300 mg total) by mouth daily.  90 capsule  3  . DULoxetine (CYMBALTA) 60 MG capsule Take 1 capsule (60 mg total) by mouth daily.  90 capsule  3  . Multiple Vitamin (MULTIVITAMIN PO) Take by mouth daily.        Marland Kitchen olmesartan-hydrochlorothiazide (BENICAR HCT) 40-25 MG per tablet Take 1 tablet by mouth daily.  90 tablet  3  . Pramipexole Dihydrochloride (MIRAPEX ER) 0.375 MG TB24 1 po q day x 7 days, then increase to 2 po q day  21 tablet  0  . Pramipexole Dihydrochloride (MIRAPEX ER) 1.5 MG TB24 Take 1 tablet (1.5 mg total) by mouth daily.  30 tablet  4  . rosuvastatin (CRESTOR) 40 MG tablet Take 0.5 tablets (20 mg total) by mouth daily.  45 tablet  3  . Cyanocobalamin (VITAMIN B 12 PO) Take by mouth daily.        . fish oil-omega-3 fatty acids 1000 MG capsule Take 2 g by mouth daily.      . potassium chloride (KLOR-CON 10) 10 MEQ tablet Take 1 tablet (10 mEq total) by mouth daily.  90 tablet  3  . [DISCONTINUED] clonazePAM (KLONOPIN) 0.5 MG tablet Take 1 tablet (0.5 mg total) by mouth 2 (two) times daily as needed for anxiety.  60 tablet  2   Last reviewed on 12/02/2011 10:03 AM by Iva Boop, MD Past Medical  History  Diagnosis Date  . GLUCOSE INTOLERANCE 05/24/2008  . HYPERLIPIDEMIA 09/23/2006  . ANXIETY 09/23/2006  . DEPRESSION 09/23/2006  . CARPAL TUNNEL SYNDROME, BILATERAL 09/23/2006  . HYPERTENSION 09/23/2006  . GERD 05/24/2008  . DIVERTICULOSIS, COLON 09/23/2006  . IBS 09/23/2006  . BUNION, RIGHT FOOT 09/18/2009  . OSTEOPOROSIS 05/24/2008  . Abdominal pain, epigastric 09/18/2009  . Impaired glucose tolerance 11/04/2010  . Anosmia 11/06/2010  . Parkinson's disease    Past Surgical History  Procedure Date  . Tubal ligation   . Nasal septum repair   . S/p ganglion cyst right wrist   . S/p bilat cts   . S/p right thumb trigger finger surgury   . S/p fibroid ablation   . Esophagogastroduodenoscopy 2008    Dr. Jeral Fruit duodenal ulcer  . Colonoscopy w/ biopsies 2008    Dr. Loreta Ave  -negative random bxs   History   Social History  . Marital Status: Married    Spouse Name: N/A    Number of Children: 2  . Years of Education: N/A   Occupational History  . Admissions supervisor [employer]   Social History Main Topics  . Smoking status: Never Smoker   . Smokeless tobacco: Never Used  . Alcohol Use: No     Comment: rare  . Drug Use: No  . Sexually Active: None   Other Topics Concern  . None   Social History Narrative  . None   Family History  Problem Relation Age of Onset  . Peripheral vascular disease Mother     with bilat amputations  . Heart attack Mother   . Hypertension Father   . Heart disease Father 2      Review of Systems She does also have stress urinary and urgency incontinence and leakage. She has tried kegel exercises over time but does not to those consistently. Her review of systems is also positive for some anxiety back pain depressive symptoms fatigue. All other review of systems negative or as per history of present illness    Objective:   Physical Exam General:  Well-developed, well-nourished and in no acute distress Eyes:  anicteric. ENT:   Mouth and posterior pharynx free of lesions.  Neck:   supple w/o thyromegaly or mass.  Lungs: Clear to auscultation bilaterally. Heart:  S1S2, no rubs, murmurs, gallops. Abdomen:  soft, non-tender, no hepatosplenomegaly, hernia, or mass and BS+.  Rectal: Female staff present - normal anoderm, + anal wink, normal resting tone and voluntary squeeze with appropriate descent and abdominal contraction with simulated defecation - no mass, brown stool. Anoscopy: Inflamed internal hemorrhoids Lymph:  no cervical or supraclavicular adenopathy. Extremities:   no edema Skin   no rash. Neuro:  A&O x 3.  Psych:  appropriate mood and  Affect.   Data Reviewed: Lab Results  Component Value Date   WBC 6.4 11/08/2011   HGB 14.4 11/08/2011   HCT 43.9 11/08/2011   MCV 90.5 11/08/2011   PLT 361.0  11/08/2011   Primary care note reviewed, neurology note reviewed 2000 a colonoscopy, EGD and pathology reports reviewed Recent MRI of the brain reviewed with the patient as well        Assessment & Plan:   1. IBS (irritable bowel syndrome)   2. Rectal bleeding - from internal hemorrhoid   3. Diarrhea - episodic and consistent with IBS   4. Fecal incontinence -urge   5. Urinary incontinence - urge and stress   6. Hemorrhoids, internal, with bleeding  1. I explained to her overall scenario is very consistent with IBS and rectal bleeding from hemorrhoids. There was some question of whether or not she had colon polyps on her colonoscopy 2008 but that is not the case. 2. Will treat hemorrhoids with Anusol-HC suppositories and lifestyle changes, handout provided. 3. Dicyclomine 20 mg every 6 hours as needed for IBS symptoms 4. A food diary will be capped and will review that and a 6-8 week followup, does not eat a high fiber diet at this point and should not. 5. Consider adding probiotic therapy 6. If she has persistent problems then endoscopic evaluation with a sigmoidoscopy or colonoscopy could be needed. She did have this fibrosis and formation in the rectum, that might have been a prolapse issue. Given the chronicity instability of her symptoms overall I do not think colonoscopy is clearly indicated but if she has persistent issues we can pursue that and she understands the plan. 7. I think she should consider urologic evaluation for her urinary symptoms and we will discuss that again when she returns.   I appreciate the opportunity to care for this patient.   CC: Oliver Barre, MD

## 2011-12-02 NOTE — Patient Instructions (Addendum)
Your physician has requested that you go to the basement for the following lab work before leaving today: TTG, IGA  You have been given handouts to read and follow today on Hemorrhoids and Irritable Bowel Syndrome.  We have sent the following medications to your pharmacy for you to pick up at your convenience: Bentyl, Anusol-HC supp.  Please keep a food diary and bring it back with you in 6 weeks for a follow-up visit.  Thank you for choosing me and [facility].  Iva Boop, M.D., [facility]

## 2011-12-03 LAB — TISSUE TRANSGLUTAMINASE, IGA: Tissue Transglutaminase Ab, IgA: 5.3 U/mL (ref <20)

## 2012-01-10 ENCOUNTER — Encounter: Payer: 59 | Admitting: Internal Medicine

## 2012-02-11 ENCOUNTER — Ambulatory Visit (INDEPENDENT_AMBULATORY_CARE_PROVIDER_SITE_OTHER): Payer: 59 | Admitting: Neurology

## 2012-02-11 ENCOUNTER — Encounter: Payer: Self-pay | Admitting: Neurology

## 2012-02-11 VITALS — BP 128/72 | HR 104 | Temp 98.3°F | Resp 20 | Wt 124.0 lb

## 2012-02-11 DIAGNOSIS — G20 Parkinson's disease: Secondary | ICD-10-CM

## 2012-02-11 MED ORDER — PRAMIPEXOLE DIHYDROCHLORIDE ER 1.5 MG PO TB24: 1.5000 mg | ORAL_TABLET | Freq: Every day | ORAL | Status: DC

## 2012-02-11 NOTE — Progress Notes (Signed)
Subjective:   Destiny Roth was seen in f/u for PD diagnosed 10/2011.  The patient is a 62 y.o. right handed female with a history of tremor.Onset of symptoms was at end of 2012.   She has noted a gradual loss of smell and taste over the last 20 years.  She even had a rhinoplasty and septoplasty in 1993 to see if that would help and it did not.  She can smell almost nothing now.    Last visit, I started the patient on Mirapex and she is up to 1.5 mg daily.  The patient states that the tremor is virtually gone, with the exception of when she is stressed at work and then she will notice tremor in both legs.  Her walking is much better.  She feels more stable.  Sometimes, she will notice that she drags the leg, but is much better than previous.  She does have some dizziness in the early morning and is unsure if that is related to Mirapex, her blood pressure medication or potassium.  No compulsive behaviors have been noted with the Mirapex.  There have been no sleep attacks.  She has not yet started exercising.  Current/Previously tried tremor medications: Klonopin  Current medications that may exacerbate tremor:  ?Cymbalta.  I reviewed the MRI of the brain done at the end of 2013 with her.  This revealed some mild small vessel disease, likely secondary to hypertension.  Otherwise, it was unremarkable.  The small vessel disease was not within the basal ganglia.  I showed her the films in the office today.   No Known Allergies  Current Outpatient Prescriptions on File Prior to Visit  Medication Sig Dispense Refill  . ascorbic acid (VITAMIN C) 500 MG tablet Take 500 mg by mouth daily.        Marland Kitchen aspirin 81 MG tablet Take 81 mg by mouth daily.        Marland Kitchen diltiazem (CARDIZEM CD) 300 MG 24 hr capsule Take 1 capsule (300 mg total) by mouth daily.  90 capsule  3  . DULoxetine (CYMBALTA) 60 MG capsule Take 1 capsule (60 mg total) by mouth daily.  90 capsule  3  . hydrocortisone (ANUSOL-HC) 25 MG  suppository Place 1 suppository (25 mg total) rectally at bedtime. Do for seven nights and then as needed  24 suppository  0  . Multiple Vitamin (MULTIVITAMIN PO) Take by mouth daily.        Marland Kitchen olmesartan-hydrochlorothiazide (BENICAR HCT) 40-25 MG per tablet Take 1 tablet by mouth daily.  90 tablet  3  . potassium chloride (KLOR-CON 10) 10 MEQ tablet Take 1 tablet (10 mEq total) by mouth daily.  90 tablet  3  . Pramipexole Dihydrochloride (MIRAPEX ER) 1.5 MG TB24 Take 1 tablet (1.5 mg total) by mouth daily.  30 tablet  4  . rosuvastatin (CRESTOR) 40 MG tablet Take 0.5 tablets (20 mg total) by mouth daily.  45 tablet  3  . calcium carbonate (OS-CAL) 600 MG TABS Take 600 mg by mouth daily.        Marland Kitchen dicyclomine (BENTYL) 20 MG tablet Take 1 tablet (20 mg total) by mouth every 6 (six) hours as needed (abdominal cramps, diarrhea).  120 tablet  0    Past Medical History  Diagnosis Date  . GLUCOSE INTOLERANCE 05/24/2008  . HYPERLIPIDEMIA 09/23/2006  . ANXIETY 09/23/2006  . DEPRESSION 09/23/2006  . CARPAL TUNNEL SYNDROME, BILATERAL 09/23/2006  . HYPERTENSION 09/23/2006  . GERD 05/24/2008  .  DIVERTICULOSIS, COLON 09/23/2006  . IBS 09/23/2006  . BUNION, RIGHT FOOT 09/18/2009  . OSTEOPOROSIS 05/24/2008  . Abdominal pain, epigastric 09/18/2009  . Impaired glucose tolerance 11/04/2010  . Anosmia 11/06/2010  . Parkinson's disease     Past Surgical History  Procedure Date  . Tubal ligation   . Nasal septum repair   . S/p ganglion cyst right wrist   . S/p bilat cts   . S/p right thumb trigger finger surgury   . S/p fibroid ablation   . Esophagogastroduodenoscopy 06-10-2006    Dr. Jeral Fruit duodenal ulcer  . Colonoscopy w/ biopsies 2006/06/10    Dr. Loreta Ave -negative random bxs    History   Social History  . Marital Status: Married    Spouse Name: N/A    Number of Children: 2  . Years of Education: N/A   Occupational History  . Admissions supervisor    Social History Main Topics  . Smoking status:  Never Smoker   . Smokeless tobacco: Never Used  . Alcohol Use: No     Comment: rare  . Drug Use: No  . Sexually Active: Not on file   Other Topics Concern  . Not on file   Social History Narrative  . No narrative on file    Family Status  Relation Status Death Age  . Father Deceased 59    MI, hypertension since teens  . Mother Deceased     2001-06-09, PVD, [place], [place]  . Brother Alive     3, HTN  . Child Alive     2, alive and well  . Maternal Grandmother Deceased     PD    Review of Systems Intermittent diarrhea and constipation with IBS.  A complete 10 system ROS was obtained and was negative apart from what is mentioned.   Objective:   VITALS:   Filed Vitals:   02/11/12 1524  BP: 128/72  Pulse: 104  Temp: 98.3 F (36.8 C)  Resp: 20  Weight: 124 lb (56.246 kg)   GEN:  The patient appears stated age and is in NAD. HEENT:  Normocephalic, atraumatic.  The mucous membranes are moist. The superficial temporal arteries are without ropiness or tenderness. CV:  RRR Lungs:  CTAB Neck/HEME:  There are no carotid bruits bilaterally.  Neurological examination:  Orientation: The patient is alert and oriented x3. Fund of knowledge is appropriate.  Recent and remote memory are intact.  Attention and concentration are normal.    Able to name objects and repeat phrases. Cranial nerves: There is good facial symmetry.  There is slight facial hypomimia with decreased blink (Stellwag's sign).  . Extraocular muscles are intact.  There are no square wave jerks. The speech is fluent and clear. Soft palate rises symmetrically and there is no tongue deviation. Hearing is intact to conversational tone. Sensation: Sensation is intact to light to light touch throughout. Motor: Strength is 5/5 in the bilateral upper and lower extremities.   Shoulder shrug is equal and symmetric.  There is no pronator drift. Deep tendon reflexes: Deep tendon reflexes are 3+/4 at the bilateral biceps, triceps,  brachioradialis, patella and achilles. Plantar responses are downgoing bilaterally.  Movement examination: Tone: There is only mild increased tone in the left upper extremity today, which is much improved compared to previous.  The tone in the right upper extremity today was normal. Abnormal movements: There is no tremor or dyskinesia today. Coordination:  There is mild reduction with rapid alternating movements in the left  hemibody.   Gait and Station:   The patient has a positive pull test.    Her gait had a fairly normal stride length today.  Arm swing was much improved.  LABS:  Lab Results  Component Value Date   WBC 6.4 11/08/2011   HGB 14.4 11/08/2011   HCT 43.9 11/08/2011   MCV 90.5 11/08/2011   PLT 361.0 11/08/2011     Chemistry      Component Value Date/Time   NA 139 11/08/2011 1600   K 3.2* 11/08/2011 1600   CL 100 11/08/2011 1600   CO2 28 11/08/2011 1600   BUN 15 11/08/2011 1600   CREATININE 0.9 11/08/2011 1600      Component Value Date/Time   CALCIUM 9.6 11/08/2011 1600   ALKPHOS 60 11/08/2011 1600   AST 24 11/08/2011 1600   ALT 23 11/08/2011 1600   BILITOT 0.6 11/08/2011 1600     Lab Results  Component Value Date   VITAMINB12 443 11/26/2011   Lab Results  Component Value Date   FOLATE 13.5 11/26/2011   Lab Results  Component Value Date   TSH 1.19 11/08/2011        Assessment/Plan:   1.  Akinetic rigid Parkinson's disease.  -I will continue the patient on the Mirapex ER  -The patient and I discussed the value of moderate cardiovascular exercise.  She does not wish to pursue PT at this time.  -I spent greater than 50% of the 60 minute visit in counseling answering questions for the patient and talking about future implications of the disease. 2.  HTN  -In the future, I suspect that she ultimately will be weaned off of 1 or perhaps both of her blood pressure medications.  She is experiencing some dizziness currently in the morning, which could be  from the Mirapex or it could be from her BP meds or minor autonomic insuff with PD.   3.  Depression  -This is common in patients with Parkinson's disease and she should remain on the Cymbalta, which is working well for her. 4.  Follow up in 4 months.

## 2012-03-06 ENCOUNTER — Other Ambulatory Visit: Payer: Self-pay | Admitting: Neurology

## 2012-03-06 MED ORDER — PRAMIPEXOLE DIHYDROCHLORIDE ER 1.5 MG PO TB24: 1.5000 mg | ORAL_TABLET | Freq: Every day | ORAL | Status: DC

## 2012-04-20 ENCOUNTER — Ambulatory Visit (INDEPENDENT_AMBULATORY_CARE_PROVIDER_SITE_OTHER): Payer: 59 | Admitting: Ophthalmology

## 2012-04-20 DIAGNOSIS — H35039 Hypertensive retinopathy, unspecified eye: Secondary | ICD-10-CM

## 2012-04-20 DIAGNOSIS — H2510 Age-related nuclear cataract, unspecified eye: Secondary | ICD-10-CM

## 2012-04-20 DIAGNOSIS — H35379 Puckering of macula, unspecified eye: Secondary | ICD-10-CM

## 2012-04-20 DIAGNOSIS — H43819 Vitreous degeneration, unspecified eye: Secondary | ICD-10-CM

## 2012-04-20 DIAGNOSIS — I10 Essential (primary) hypertension: Secondary | ICD-10-CM

## 2012-05-29 ENCOUNTER — Telehealth: Payer: Self-pay | Admitting: Neurology

## 2012-05-29 NOTE — Telephone Encounter (Signed)
Picked up a call from the patient who is requesting a refill on her Mirapex 1.5 mg po daily. She reports that she will lose her insurance benefits in a couple of weeks and would like to have a 3 month supply called to the Outpatient Pharm[facility] ([facility]) on [address]. She still has about a month's worth of medication. **Dr. Arbutus Leas, please advise. Thanks.

## 2012-05-31 NOTE — Telephone Encounter (Signed)
Fine with me, as we talked about verbally on Friday.

## 2012-06-01 ENCOUNTER — Other Ambulatory Visit: Payer: Self-pay | Admitting: Neurology

## 2012-06-01 MED ORDER — PRAMIPEXOLE DIHYDROCHLORIDE ER 1.5 MG PO TB24: 1.5000 mg | ORAL_TABLET | Freq: Every day | ORAL | Status: DC

## 2012-06-01 NOTE — Telephone Encounter (Signed)
Left the patient a voice mail message at her work number stating Mirapex refilled electronically and to call if there was a problem.

## 2012-06-05 ENCOUNTER — Encounter: Payer: Self-pay | Admitting: Neurology

## 2012-06-05 ENCOUNTER — Ambulatory Visit (INDEPENDENT_AMBULATORY_CARE_PROVIDER_SITE_OTHER): Payer: 59 | Admitting: Neurology

## 2012-06-05 VITALS — BP 110/68 | HR 96 | Temp 98.2°F | Resp 20 | Wt 128.0 lb

## 2012-06-05 DIAGNOSIS — G20 Parkinson's disease: Secondary | ICD-10-CM

## 2012-06-05 DIAGNOSIS — G20A1 Parkinson's disease without dyskinesia, without mention of fluctuations: Secondary | ICD-10-CM

## 2012-06-05 NOTE — Patient Instructions (Addendum)
1.  Exercise!! 2.  Continue to stay on Mirapex ER 1.5 mg daily 3.  Follow up with Dr. Arbutus Leas in 4 months.

## 2012-06-05 NOTE — Progress Notes (Signed)
Subjective:   Destiny Roth was seen in f/u for PD diagnosed 10/2011.  The patient is a 62 y.o. right handed female with a history of tremor.Onset of symptoms was at end of 2012.   She has noted a gradual loss of smell and taste over the last 20 years.  She even had a rhinoplasty and septoplasty in 1993 to see if that would help and it did not.  She can smell almost nothing now.    She is on Mirapex ER 1.5 mg daily.  The patient states that the tremor is virtually gone, with the exception of when she is stressed at work and then she will notice tremor in both legs.  Her walking is much better.  She feels more stable.  .  She does have some dizziness in the early morning and is unsure if that is related to Mirapex, her blood pressure medication or potassium.  No compulsive behaviors have been noted with the Mirapex.  There have been no sleep attacks.  She has not yet started exercising.  She is going to be changing jobs which is stressful for her.  Current/Previously tried tremor medications: Klonopin  Current medications that may exacerbate tremor:  ?Cymbalta.  I reviewed the MRI of the brain done at the end of 2013 with her.  This revealed some mild small vessel disease, likely secondary to hypertension.  Otherwise, it was unremarkable.  The small vessel disease was not within the basal ganglia.  I showed her the films in the office today.   No Known Allergies  Current Outpatient Prescriptions on File Prior to Visit  Medication Sig Dispense Refill  . dicyclomine (BENTYL) 20 MG tablet Take 1 tablet (20 mg total) by mouth every 6 (six) hours as needed (abdominal cramps, diarrhea).  120 tablet  0  . diltiazem (CARDIZEM CD) 300 MG 24 hr capsule Take 1 capsule (300 mg total) by mouth daily.  90 capsule  3  . DULoxetine (CYMBALTA) 60 MG capsule Take 1 capsule (60 mg total) by mouth daily.  90 capsule  3  . hydrocortisone (ANUSOL-HC) 25 MG suppository Place 1 suppository (25 mg total)  rectally at bedtime. Do for seven nights and then as needed  24 suppository  0  . olmesartan-hydrochlorothiazide (BENICAR HCT) 40-25 MG per tablet Take 1 tablet by mouth daily.  90 tablet  3  . potassium chloride (KLOR-CON 10) 10 MEQ tablet Take 1 tablet (10 mEq total) by mouth daily.  90 tablet  3  . Pramipexole Dihydrochloride (MIRAPEX ER) 1.5 MG TB24 Take 1 tablet (1.5 mg total) by mouth daily.  90 tablet  1  . rosuvastatin (CRESTOR) 40 MG tablet Take 0.5 tablets (20 mg total) by mouth daily.  45 tablet  3  . ascorbic acid (VITAMIN C) 500 MG tablet Take 500 mg by mouth daily.        Marland Kitchen aspirin 81 MG tablet Take 81 mg by mouth daily.        . calcium carbonate (OS-CAL) 600 MG TABS Take 600 mg by mouth daily.        . Multiple Vitamin (MULTIVITAMIN PO) Take by mouth daily.         No current facility-administered medications on file prior to visit.    Past Medical History  Diagnosis Date  . GLUCOSE INTOLERANCE 05/24/2008  . HYPERLIPIDEMIA 09/23/2006  . ANXIETY 09/23/2006  . DEPRESSION 09/23/2006  . CARPAL TUNNEL SYNDROME, BILATERAL 09/23/2006  . HYPERTENSION 09/23/2006  . GERD  05/24/2008  . DIVERTICULOSIS, COLON 09/23/2006  . IBS 09/23/2006  . BUNION, RIGHT FOOT 09/18/2009  . OSTEOPOROSIS 05/24/2008  . Abdominal pain, epigastric 09/18/2009  . Impaired glucose tolerance 11/04/2010  . Anosmia 11/06/2010  . Parkinson's disease     Past Surgical History  Procedure Laterality Date  . Tubal ligation    . Nasal septum repair    . S/p ganglion cyst right wrist    . S/p bilat cts    . S/p right thumb trigger finger surgury    . S/p fibroid ablation    . Esophagogastroduodenoscopy  Jun 11, 2006    Dr. Jeral Fruit duodenal ulcer  . Colonoscopy w/ biopsies  06/11/2006    Dr. Loreta Ave -negative random bxs    History   Social History  . Marital Status: Married    Spouse Name: N/A    Number of Children: 2  . Years of Education: N/A   Occupational History  . 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 Smoking status: Never Smoker   . Smokeless tobacco: Never Used  . Alcohol Use: No     Comment: rare  . Drug Use: No  . Sexually Active: Not on file   Other Topics Concern  . Not on file   Social History Narrative  . No narrative on file    Family Status  Relation Status Death Age  . Father Deceased 22    MI, hypertension since teens  . Mother Deceased     06/10/2001, PVD, Raynauds, MI  . Brother Alive     3, HTN  . Child Alive     2, alive and well  . Maternal Grandmother Deceased     PD    Review of Systems Intermittent diarrhea and constipation with IBS.  A complete 10 system ROS was obtained and was negative apart from what is mentioned.   Objective:   VITALS:   Filed Vitals:   06/05/12 1500  BP: 110/68  Pulse: 96  Temp: 98.2 F (36.8 C)  Resp: 20  Weight: 128 lb (58.06 kg)   GEN:  The patient appears stated age and is in NAD. HEENT:  Normocephalic, atraumatic.  The mucous membranes are moist. The superficial temporal arteries are without ropiness or tenderness. CV:  RRR Lungs:  CTAB Neck/HEME:  There are no carotid bruits bilaterally.  Neurological examination:  Orientation: The patient is alert and oriented x3. Fund of knowledge is appropriate.  Recent and remote memory are intact.  Attention and concentration are normal.    Able to name objects and repeat phrases. Cranial nerves: There is good facial symmetry.  There is slight facial hypomimia with decreased blink (Stellwag's sign).  . Extraocular muscles are intact.  There are no square wave jerks. The speech is fluent and clear. Soft palate rises symmetrically and there is no tongue deviation. Hearing is intact to conversational tone. Sensation: Sensation is intact to light to light touch throughout. Motor: Strength is 5/5 in the bilateral upper and lower extremities.   Shoulder shrug is equal and symmetric.  There is no pronator drift. Deep tendon reflexes: Deep tendon reflexes are 3+/4 at the  bilateral biceps, triceps, brachioradialis, patella and achilles. Plantar responses are downgoing bilaterally.  Movement examination: Tone: There is only mild increased tone in the left upper extremity today, which is much improved compared to previous.  The tone in the right upper extremity today was normal. Abnormal movements: There is no tremor or dyskinesia today. Coordination:  There is mild reduction with rapid alternating movements in the left hemibody.   Gait and Station:   The patient has a positive pull test.    Her gait had a fairly normal stride length today.  Arm swing was much improved.  LABS:  Lab Results  Component Value Date   WBC 6.4 11/08/2011   HGB 14.4 11/08/2011   HCT 43.9 11/08/2011   MCV 90.5 11/08/2011   PLT 361.0 11/08/2011     Chemistry      Component Value Date/Time   NA 139 11/08/2011 1600   K 3.2* 11/08/2011 1600   CL 100 11/08/2011 1600   CO2 28 11/08/2011 1600   BUN 15 11/08/2011 1600   CREATININE 0.9 11/08/2011 1600      Component Value Date/Time   CALCIUM 9.6 11/08/2011 1600   ALKPHOS 60 11/08/2011 1600   AST 24 11/08/2011 1600   ALT 23 11/08/2011 1600   BILITOT 0.6 11/08/2011 1600     Lab Results  Component Value Date   VITAMINB12 443 11/26/2011   Lab Results  Component Value Date   FOLATE 13.5 11/26/2011   Lab Results  Component Value Date   TSH 1.19 11/08/2011        Assessment/Plan:   1.  Akinetic rigid Parkinson's disease.  -I will continue the patient on the Mirapex ER 1.5 mg daily.  -The patient and I discussed the value of moderate cardiovascular exercise.  She does not wish to pursue PT at this time.  -I spent greater than 50% of the 60 minute visit in counseling answering questions for the patient and talking about future implications of the disease. 2.  HTN  -In the future, I suspect that she ultimately will be weaned off of 1 or perhaps both of her blood pressure medications.  She is experiencing some dizziness  currently in the morning, which could be from the Mirapex or it could be from her BP meds or minor autonomic insuff with PD.   3.  Depression  -This is common in patients with Parkinson's disease and she should remain on the Cymbalta, which is working well for her. 4.  Follow up in 4 months.

## 2012-08-12 ENCOUNTER — Other Ambulatory Visit: Payer: Self-pay

## 2012-08-12 MED ORDER — DULOXETINE HCL 60 MG PO CPEP: 60.0000 mg | ORAL_CAPSULE | Freq: Every day | ORAL | Status: DC

## 2012-08-13 ENCOUNTER — Other Ambulatory Visit: Payer: Self-pay

## 2012-08-13 MED ORDER — DILTIAZEM HCL ER COATED BEADS 300 MG PO CP24: 300.0000 mg | ORAL_CAPSULE | Freq: Every day | ORAL | Status: DC

## 2012-08-13 MED ORDER — POTASSIUM CHLORIDE ER 10 MEQ PO TBCR: 10.0000 meq | EXTENDED_RELEASE_TABLET | Freq: Every day | ORAL | Status: DC

## 2012-08-13 MED ORDER — ROSUVASTATIN CALCIUM 40 MG PO TABS: 20.0000 mg | ORAL_TABLET | Freq: Every day | ORAL | Status: DC

## 2012-08-21 ENCOUNTER — Other Ambulatory Visit: Payer: Self-pay | Admitting: *Deleted

## 2012-08-21 MED ORDER — OLMESARTAN MEDOXOMIL-HCTZ 40-25 MG PO TABS: 1.0000 | ORAL_TABLET | Freq: Every day | ORAL | Status: DC

## 2012-08-21 MED ORDER — POTASSIUM CHLORIDE ER 10 MEQ PO TBCR: 10.0000 meq | EXTENDED_RELEASE_TABLET | Freq: Every day | ORAL | Status: DC

## 2012-08-24 ENCOUNTER — Other Ambulatory Visit: Payer: Self-pay

## 2012-08-24 ENCOUNTER — Other Ambulatory Visit: Payer: Self-pay | Admitting: *Deleted

## 2012-08-24 MED ORDER — PRAMIPEXOLE DIHYDROCHLORIDE ER 1.5 MG PO TB24: 1.5000 mg | ORAL_TABLET | Freq: Every day | ORAL | Status: DC

## 2012-08-24 MED ORDER — OLMESARTAN MEDOXOMIL-HCTZ 40-25 MG PO TABS: 1.0000 | ORAL_TABLET | Freq: Every day | ORAL | Status: DC

## 2012-10-12 ENCOUNTER — Ambulatory Visit (INDEPENDENT_AMBULATORY_CARE_PROVIDER_SITE_OTHER): Payer: Managed Care, Other (non HMO) | Admitting: Neurology

## 2012-10-12 VITALS — BP 128/76 | HR 86 | Temp 98.1°F | Resp 16 | Wt 137.1 lb

## 2012-10-12 DIAGNOSIS — F329 Major depressive disorder, single episode, unspecified: Secondary | ICD-10-CM

## 2012-10-12 DIAGNOSIS — G20 Parkinson's disease: Secondary | ICD-10-CM

## 2012-10-12 LAB — CBC WITH DIFFERENTIAL/PLATELET
Eosinophils Relative: 1.5 % (ref 0.0–5.0)
HCT: 42.1 % (ref 36.0–46.0)
Hemoglobin: 14.5 g/dL (ref 12.0–15.0)
Lymphs Abs: 1 10*3/uL (ref 0.7–4.0)
Monocytes Relative: 6.5 % (ref 3.0–12.0)
Neutro Abs: 4.3 10*3/uL (ref 1.4–7.7)
RDW: 12.6 % (ref 11.5–14.6)
WBC: 5.8 10*3/uL (ref 4.5–10.5)

## 2012-10-12 LAB — COMPREHENSIVE METABOLIC PANEL
BUN: 20 mg/dL (ref 6–23)
CO2: 29 mEq/L (ref 19–32)
Creatinine, Ser: 0.9 mg/dL (ref 0.4–1.2)
GFR: 67.86 mL/min (ref >60.00)
Glucose, Bld: 90 mg/dL (ref 70–99)
Total Bilirubin: 0.4 mg/dL (ref 0.3–1.2)

## 2012-10-12 LAB — TSH: TSH: 1.12 u[IU]/mL (ref 0.35–5.50)

## 2012-10-12 MED ORDER — PRAMIPEXOLE DIHYDROCHLORIDE ER 2.25 MG PO TB24: 1.0000 | ORAL_TABLET | Freq: Every day | ORAL | Status: DC

## 2012-10-12 MED ORDER — DULOXETINE HCL 60 MG PO CPEP: 60.0000 mg | ORAL_CAPSULE | Freq: Every day | ORAL | Status: DC

## 2012-10-12 NOTE — Progress Notes (Signed)
Subjective:   Destiny Roth was seen in f/u for PD diagnosed 10/2011.  The patient is a 62 y.o. right handed female with a history of tremor.Onset of symptoms was at end of 2012.   She has noted a gradual loss of smell and taste over the last 20 years.  She even had a rhinoplasty and septoplasty in 1993 to see if that would help and it did not.  She can smell almost nothing now.    She is on Mirapex ER 1.5 mg daily.  The patient states that the tremor is virtually gone, with the exception of when she is stressed at work and then she will notice tremor in both legs.  Her walking is much better.  She feels more stable.  .  She does have some dizziness in the early morning and is unsure if that is related to Mirapex, her blood pressure medication or potassium.  No compulsive behaviors have been noted with the Mirapex.  There have been no sleep attacks.  She has not yet started exercising.  She is going to be changing jobs which is stressful for her.  10/12/12 update:  She has had some tremor internally and in the legs.  She notices some word finding trouble.   She has been under increased stress.  Her husband had back surgery 2 weeks ago and she has been the primary caregiver.  In addition, she is between jobs and needs to find a new job.  She would like to see if she can get off her blood pressure medication.  Wearing off:  no  How long before next dose:  n/a Falls:   no N/V:  no Hallucinations:  no  visual distortions: no Lightheaded:  yes  Syncope: no Dyskinesia:  no Compulsive behaviors:  no   Current/Previously tried tremor medications: Klonopin  Current medications that may exacerbate tremor:  ?Cymbalta.  I reviewed the MRI of the brain done at the end of 2013 with her.  This revealed some mild small vessel disease, likely secondary to hypertension.  Otherwise, it was unremarkable.  The small vessel disease was not within the basal ganglia.  I showed her the films in the office  today.   No Known Allergies  Current Outpatient Prescriptions on File Prior to Visit  Medication Sig Dispense Refill  . aspirin 81 MG tablet Take 81 mg by mouth daily.        Marland Kitchen diltiazem (CARDIZEM CD) 300 MG 24 hr capsule Take 1 capsule (300 mg total) by mouth daily.  90 capsule  1  . DULoxetine (CYMBALTA) 60 MG capsule Take 1 capsule (60 mg total) by mouth daily.  90 capsule  1  . olmesartan-hydrochlorothiazide (BENICAR HCT) 40-25 MG per tablet Take 1 tablet by mouth daily.  90 tablet  0  . potassium chloride (KLOR-CON 10) 10 MEQ tablet Take 1 tablet (10 mEq total) by mouth daily.  90 tablet  0  . Pramipexole Dihydrochloride (MIRAPEX ER) 1.5 MG TB24 Take 1 tablet (1.5 mg total) by mouth daily.  90 tablet  1  . rosuvastatin (CRESTOR) 40 MG tablet Take 0.5 tablets (20 mg total) by mouth daily.  45 tablet  1  . ascorbic acid (VITAMIN C) 500 MG tablet Take 500 mg by mouth daily.        . calcium carbonate (OS-CAL) 600 MG TABS Take 600 mg by mouth daily.        Marland Kitchen dicyclomine (BENTYL) 20 MG tablet Take 1 tablet (  20 mg total) by mouth every 6 (six) hours as needed (abdominal cramps, diarrhea).  120 tablet  0  . hydrocortisone (ANUSOL-HC) 25 MG suppository Place 1 suppository (25 mg total) rectally at bedtime. Do for seven nights and then as needed  24 suppository  0  . Multiple Vitamin (MULTIVITAMIN PO) Take by mouth daily.         No current facility-administered medications on file prior to visit.    Past Medical History  Diagnosis Date  . GLUCOSE INTOLERANCE 05/24/2008  . HYPERLIPIDEMIA 09/23/2006  . ANXIETY 09/23/2006  . DEPRESSION 09/23/2006  . CARPAL TUNNEL SYNDROME, BILATERAL 09/23/2006  . HYPERTENSION 09/23/2006  . GERD 05/24/2008  . DIVERTICULOSIS, COLON 09/23/2006  . IBS 09/23/2006  . BUNION, RIGHT FOOT 09/18/2009  . OSTEOPOROSIS 05/24/2008  . Abdominal pain, epigastric 09/18/2009  . Impaired glucose tolerance 11/04/2010  . Anosmia 11/06/2010  . Parkinson's disease     Past Surgical  History  Procedure Laterality Date  . Tubal ligation    . Nasal septum repair    . S/p ganglion cyst right wrist    . S/p bilat cts    . S/p right thumb trigger finger surgury    . S/p fibroid ablation    . Esophagogastroduodenoscopy  Jun 14, 2006    Dr. Jeral Fruit duodenal ulcer  . Colonoscopy w/ biopsies  2006-06-14    Dr. Loreta Ave -negative random bxs    History   Social History  . Marital Status: Married    Spouse Name: N/A    Number of Children: 2  . 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 Smoking status: Never Smoker   . Smokeless tobacco: Never Used  . Alcohol Use: No     Comment: rare  . Drug Use: No  . Sexual Activity: Not on file   Other Topics Concern  . Not on file   Social History Narrative  . No narrative on file    Family Status  Relation Status Death Age  . Father Deceased 59    MI, hypertension since teens  . Mother Deceased     06/13/2001, PVD, Raynauds, MI  . Brother Alive     3, HTN  . Child Alive     2, alive and well  . Maternal Grandmother Deceased     PD    Review of Systems Intermittent diarrhea and constipation with IBS.  A complete 10 system ROS was obtained and was negative apart from what is mentioned.   Objective:   VITALS:   Filed Vitals:   10/12/12 1503  BP: 128/76  Pulse: 86  Temp: 98.1 F (36.7 C)  Resp: 16  Weight: 137 lb 1.6 oz (62.188 kg)   GEN:  The patient appears stated age and is in NAD. HEENT:  Normocephalic, atraumatic.  The mucous membranes are moist. The superficial temporal arteries are without ropiness or tenderness. CV:  RRR Lungs:  CTAB Neck/HEME:  There are no carotid bruits bilaterally.  Neurological examination:  Orientation: The patient is alert and oriented x3. Fund of knowledge is appropriate.  Recent and remote memory are intact.  Attention and concentration are normal.    Able to name objects and repeat phrases. Cranial nerves:  There is good facial symmetry.  There is slight facial hypomimia with decreased blink (Stellwag's sign).  . Extraocular muscles are intact.  There are no square wave jerks. The speech is fluent and clear.  Soft palate rises symmetrically and there is no tongue deviation. Hearing is intact to conversational tone. Sensation: Sensation is intact to light to light touch throughout. Motor: Strength is 5/5 in the bilateral upper and lower extremities.   Shoulder shrug is equal and symmetric.  There is no pronator drift. Deep tendon reflexes: Deep tendon reflexes are 3+/4 at the bilateral biceps, triceps, brachioradialis, patella and achilles. Plantar responses are downgoing bilaterally.  Movement examination: Tone: There is only mild increased tone in the left upper extremity today.  The tone in the right upper extremity today was normal. Abnormal movements: There is a LLE tremor, mild and intermittent. Coordination:  There is mild reduction with rapid alternating movements in the left hemibody.   Gait and Station:    Her gait had a fairly normal stride length today.  Arm swing was much improved.  She is very stable.  LABS:  Lab Results  Component Value Date   WBC 6.4 11/08/2011   HGB 14.4 11/08/2011   HCT 43.9 11/08/2011   MCV 90.5 11/08/2011   PLT 361.0 11/08/2011     Chemistry      Component Value Date/Time   NA 139 11/08/2011 1600   K 3.2* 11/08/2011 1600   CL 100 11/08/2011 1600   CO2 28 11/08/2011 1600   BUN 15 11/08/2011 1600   CREATININE 0.9 11/08/2011 1600      Component Value Date/Time   CALCIUM 9.6 11/08/2011 1600   ALKPHOS 60 11/08/2011 1600   AST 24 11/08/2011 1600   ALT 23 11/08/2011 1600   BILITOT 0.6 11/08/2011 1600     Lab Results  Component Value Date   VITAMINB12 443 11/26/2011   Lab Results  Component Value Date   FOLATE 13.5 11/26/2011   Lab Results  Component Value Date   TSH 1.19 11/08/2011        Assessment/Plan:   1.  Akinetic rigid  Parkinson's disease.  -I am going to increase her Mirapex to 2.25 mg daily.  She can finish off the amount of Mirapex 1.5 mg tablets that she currently has before starting the next.  Risks, benefits, side effects and alternative therapies were discussed.  The opportunity to ask questions was given and they were answered to the best of my ability.  The patient expressed understanding and willingness to follow the outlined treatment protocols.  -The patient and I discussed the value of moderate cardiovascular exercise.  She does not wish to pursue PT at this time.  -I spent greater than 50% of the 60 minute visit in counseling answering questions for the patient and talking about future implications of the disease. 2.  HTN  -In the future, I suspect that she ultimately will be weaned off of 1 or perhaps both of her blood pressure medications.  She is experiencing some dizziness currently in the morning, which could be from the Mirapex or it could be from her BP meds or minor autonomic insuff with PD.   I sent a note to her primary care physician in regards to this. 3.  Depression  -This is common in patients with Parkinson's disease and she should remain on the Cymbalta, which is working well for her. 4.  Follow up in 4 months.  We did draw a CBC, chemistry and TSH today.

## 2012-10-12 NOTE — Patient Instructions (Addendum)
Follow up in Mid-January at 3pm.

## 2012-10-13 ENCOUNTER — Telehealth: Payer: Self-pay

## 2012-10-13 DIAGNOSIS — Z0000 Encounter for general adult medical examination without abnormal findings: Secondary | ICD-10-CM

## 2012-10-13 NOTE — Telephone Encounter (Signed)
CP[facility] labs entered  

## 2012-10-14 ENCOUNTER — Telehealth: Payer: Self-pay | Admitting: Internal Medicine

## 2012-10-14 MED ORDER — OLMESARTAN MEDOXOMIL-HCTZ 40-12.5 MG PO TABS: 1.0000 | ORAL_TABLET | Freq: Every day | ORAL | Status: DC

## 2012-10-14 NOTE — Telephone Encounter (Signed)
Ok to let pt know that per Dr Tat, it seemed a good idea to reduce her BP med due to recurring dizziness  Ok to change the benicar hct from 40/25 mg to the 40/12.5 mg per day - done to the [facility] home delivery

## 2012-10-14 NOTE — Telephone Encounter (Signed)
Message copied by Corwin Levins on Wed Oct 14, 2012 12:33 PM ------      Message from: TAT, REBECCA S      Created: Mon Oct 12, 2012  4:16 PM       Hi there!  I saw this pt today who has mild PD and she would like to see if she can decrease her BP med.  She has just mild dizziness.  I told her that from my standpoint this is reasonable, but that it would be up to you.  She has a PE appt with you in December.            Thank you!            rebecca ------

## 2012-10-15 MED ORDER — OLMESARTAN MEDOXOMIL-HCTZ 40-12.5 MG PO TABS: 1.0000 | ORAL_TABLET | Freq: Every day | ORAL | Status: DC

## 2012-10-15 MED ORDER — DILTIAZEM HCL ER COATED BEADS 300 MG PO CP24: 300.0000 mg | ORAL_CAPSULE | Freq: Every day | ORAL | Status: DC

## 2012-10-15 MED ORDER — POTASSIUM CHLORIDE ER 10 MEQ PO TBCR: 10.0000 meq | EXTENDED_RELEASE_TABLET | Freq: Every day | ORAL | Status: DC

## 2012-10-15 MED ORDER — ROSUVASTATIN CALCIUM 40 MG PO TABS: 20.0000 mg | ORAL_TABLET | Freq: Every day | ORAL | Status: DC

## 2012-10-15 NOTE — Telephone Encounter (Signed)
Called the patient informed of MD instructions.  She agreed to take.  Also requested hardcopy of crestor, benicar, potassium and Diltiazem to pickup for her to mail to [facility] and patient is scheduled for next appointment in December.

## 2012-10-15 NOTE — Addendum Note (Signed)
Addended by: Scharlene Gloss B on: 10/15/2012 12:00 PM   Modules accepted: Orders

## 2012-10-21 ENCOUNTER — Telehealth: Payer: Self-pay | Admitting: Neurology

## 2012-10-21 NOTE — Telephone Encounter (Signed)
Called patient and relayed your message that labs look good.

## 2012-10-21 NOTE — Telephone Encounter (Signed)
They looked good

## 2012-11-02 ENCOUNTER — Encounter: Payer: Self-pay | Admitting: Internal Medicine

## 2012-11-17 ENCOUNTER — Telehealth: Payer: Self-pay | Admitting: *Deleted

## 2012-11-17 NOTE — Telephone Encounter (Signed)
Destiny Roth called states the Crestor 40mg  needs a PA, alternatives are Simvastatin, Atorvastatin; Benicar HCT needs a PA, alternatives are Losartin HCT, Valsartin HCT, Candesartin HCT.  Please advise

## 2012-11-18 MED ORDER — VALSARTAN-HYDROCHLOROTHIAZIDE 320-12.5 MG PO TABS: 1.0000 | ORAL_TABLET | Freq: Every day | ORAL | Status: DC

## 2012-11-18 MED ORDER — ATORVASTATIN CALCIUM 20 MG PO TABS: 20.0000 mg | ORAL_TABLET | Freq: Every day | ORAL | Status: DC

## 2012-11-18 NOTE — Telephone Encounter (Signed)
Robin to let pt know; if ok with her, I had to change the Crestor to lipitor, and the benicar HCT to diovan HCT and sent to her [facility] pharmacy, as both are covered and should be very close to what she now has

## 2012-11-18 NOTE — Telephone Encounter (Signed)
Called the patient  On Cell, work and home number left a message to call back.

## 2012-11-18 NOTE — Telephone Encounter (Signed)
Patient informed. 

## 2012-11-19 ENCOUNTER — Ambulatory Visit (INDEPENDENT_AMBULATORY_CARE_PROVIDER_SITE_OTHER): Payer: Managed Care, Other (non HMO) | Admitting: Obstetrics & Gynecology

## 2012-11-19 ENCOUNTER — Encounter: Payer: Self-pay | Admitting: Obstetrics & Gynecology

## 2012-11-19 VITALS — BP 137/90 | HR 104 | Temp 99.1°F | Ht <= 58 in | Wt 138.0 lb

## 2012-11-19 DIAGNOSIS — R198 Other specified symptoms and signs involving the digestive system and abdomen: Secondary | ICD-10-CM

## 2012-11-19 DIAGNOSIS — Z01419 Encounter for gynecological examination (general) (routine) without abnormal findings: Secondary | ICD-10-CM

## 2012-11-19 DIAGNOSIS — Z0000 Encounter for general adult medical examination without abnormal findings: Secondary | ICD-10-CM

## 2012-11-19 NOTE — Progress Notes (Signed)
Subjective:     Destiny Roth is a 63 y.o. female here for a routine exam.  Current complaints: Patient is in the office today for annual exam-  Patient reports she has had a problem with bowel function and reflux recently. Patient reports she is doing well with menopausal symptoms- some warm spells, but nothing bothersome.  Personal health questionnaire reviewed: yes.   Gynecologic History No LMP recorded. Patient is postmenopausal. Contraception: post menopausal status Last Pap: 2-3 years ago. Results were: normal Last mammogram: recent 09/2012. Results were: normal  Obstetric History OB History  No data available     The following portions of the patient's history were reviewed and updated as appropriate: allergies, current medications, past family history, past medical history, past social history, past surgical history and problem list.  Review of Systems Pertinent items are noted in HPI.    Objective:      General appearance: alert Breasts: normal appearance, no masses or tenderness Abdomen: soft, non-tender; bowel sounds normal; no masses,  no organomegaly Pelvic: cervix normal in appearance, external genitalia normal, no adnexal masses or tenderness, uterus normal size, shape, and consistency and vagina normal without discharge      Assessment:   Functional bowel d/o  Plan:   Referral-->GI @ [facility] CT of abd/pelvis Return prn

## 2012-11-20 LAB — PAP IG W/ RFLX HPV ASCU

## 2012-11-20 NOTE — Patient Instructions (Signed)

## 2012-11-24 ENCOUNTER — Other Ambulatory Visit: Payer: Self-pay | Admitting: Obstetrics & Gynecology

## 2012-11-24 ENCOUNTER — Ambulatory Visit (HOSPITAL_COMMUNITY): Payer: Managed Care, Other (non HMO)

## 2012-11-24 ENCOUNTER — Ambulatory Visit
Admission: RE | Admit: 2012-11-24 | Discharge: 2012-11-24 | Disposition: A | Discharge status: Home / Self Care | Payer: No Typology Code available for payment source | Source: Ambulatory Visit | Attending: Obstetrics & Gynecology | Admitting: Obstetrics & Gynecology

## 2012-11-24 DIAGNOSIS — R198 Other specified symptoms and signs involving the digestive system and abdomen: Secondary | ICD-10-CM

## 2012-11-24 MED ORDER — IOHEXOL 300 MG/ML  SOLN
100.0000 mL | Freq: Once | INTRAMUSCULAR | Status: AC | PRN
  Administered 2012-11-24: 100 mL via INTRAVENOUS

## 2012-12-01 ENCOUNTER — Other Ambulatory Visit: Payer: Self-pay | Admitting: *Deleted

## 2012-12-01 MED ORDER — ATORVASTATIN CALCIUM 20 MG PO TABS: 20.0000 mg | ORAL_TABLET | Freq: Every day | ORAL | Status: DC

## 2012-12-01 MED ORDER — VALSARTAN-HYDROCHLOROTHIAZIDE 320-12.5 MG PO TABS: 1.0000 | ORAL_TABLET | Freq: Every day | ORAL | Status: DC

## 2012-12-03 ENCOUNTER — Other Ambulatory Visit: Payer: Self-pay

## 2012-12-29 ENCOUNTER — Other Ambulatory Visit (INDEPENDENT_AMBULATORY_CARE_PROVIDER_SITE_OTHER): Payer: Managed Care, Other (non HMO)

## 2012-12-29 DIAGNOSIS — Z0000 Encounter for general adult medical examination without abnormal findings: Secondary | ICD-10-CM

## 2012-12-29 LAB — BASIC METABOLIC PANEL
Calcium: 9.7 mg/dL (ref 8.4–10.5)
Chloride: 100 mEq/L (ref 96–112)
Creatinine, Ser: 0.9 mg/dL (ref 0.4–1.2)
GFR: 68.7 mL/min (ref >60.00)
Glucose, Bld: 96 mg/dL (ref 70–99)
Potassium: 3.8 mEq/L (ref 3.5–5.1)

## 2012-12-29 LAB — URINALYSIS, ROUTINE W REFLEX MICROSCOPIC
Bilirubin Urine: NEGATIVE
Nitrite: NEGATIVE
RBC / HPF: NONE SEEN (ref 0–?)
Specific Gravity, Urine: 1.025 (ref 1.000–1.030)
Total Protein, Urine: NEGATIVE
pH: 6 (ref 5.0–8.0)

## 2012-12-29 LAB — CBC WITH DIFFERENTIAL/PLATELET
Basophils Absolute: 0 10*3/uL (ref 0.0–0.1)
Eosinophils Absolute: 0.1 10*3/uL (ref 0.0–0.7)
Eosinophils Relative: 0.9 % (ref 0.0–5.0)
Lymphocytes Relative: 14.2 % (ref 12.0–46.0)
MCHC: 34.1 g/dL (ref 30.0–36.0)
MCV: 88.1 fl (ref 78.0–100.0)
Monocytes Absolute: 0.5 10*3/uL (ref 0.1–1.0)
Neutro Abs: 7 10*3/uL (ref 1.4–7.7)
Neutrophils Relative %: 79.5 % — ABNORMAL HIGH (ref 43.0–77.0)
RDW: 12.4 % (ref 11.5–14.6)

## 2012-12-29 LAB — LIPID PANEL
Cholesterol: 173 mg/dL (ref 0–200)
VLDL: 22.4 mg/dL (ref 0.0–40.0)

## 2012-12-29 LAB — HEPATIC FUNCTION PANEL
ALT: 33 U/L (ref 0–35)
Bilirubin, Direct: 0.1 mg/dL (ref 0.0–0.3)
Total Bilirubin: 0.7 mg/dL (ref 0.3–1.2)
Total Protein: 7.6 g/dL (ref 6.0–8.3)

## 2012-12-29 LAB — TSH: TSH: 0.79 u[IU]/mL (ref 0.35–5.50)

## 2013-01-01 ENCOUNTER — Ambulatory Visit (INDEPENDENT_AMBULATORY_CARE_PROVIDER_SITE_OTHER): Payer: Managed Care, Other (non HMO) | Admitting: Internal Medicine

## 2013-01-01 ENCOUNTER — Encounter: Payer: Self-pay | Admitting: Internal Medicine

## 2013-01-01 VITALS — BP 132/80 | HR 109 | Temp 98.1°F | Ht <= 58 in | Wt 138.0 lb

## 2013-01-01 DIAGNOSIS — G4733 Obstructive sleep apnea (adult) (pediatric): Secondary | ICD-10-CM | POA: Insufficient documentation

## 2013-01-01 DIAGNOSIS — G4710 Hypersomnia, unspecified: Secondary | ICD-10-CM

## 2013-01-01 DIAGNOSIS — M7530 Calcific tendinitis of unspecified shoulder: Secondary | ICD-10-CM | POA: Insufficient documentation

## 2013-01-01 DIAGNOSIS — Z23 Encounter for immunization: Secondary | ICD-10-CM

## 2013-01-01 DIAGNOSIS — I2510 Atherosclerotic heart disease of native coronary artery without angina pectoris: Secondary | ICD-10-CM | POA: Insufficient documentation

## 2013-01-01 DIAGNOSIS — Z0000 Encounter for general adult medical examination without abnormal findings: Secondary | ICD-10-CM

## 2013-01-01 NOTE — Progress Notes (Signed)
Pre-visit discussion using our clinic review tool. No additional management support is needed unless otherwise documented below in the visit note.  

## 2013-01-01 NOTE — Progress Notes (Signed)
Subjective:    Patient ID: Destiny Roth, female    DOB: 1950/11/14, 62 y.o.   MRN: [id number]  HPI  Here for wellness and f/u;  Overall doing ok;  Pt denies CP, worsening SOB, DOE, wheezing, orthopnea, PND, worsening LE edema, palpitations, dizziness or syncope.  Pt denies neurological change such as new headache, facial or extremity weakness.  Pt denies polydipsia, polyuria, or low sugar symptoms. Pt states overall good compliance with treatment and medications, good tolerability, and has been trying to follow lower cholesterol diet.  Pt denies worsening depressive symptoms, suicidal ideation or panic. No fever, night sweats, wt loss, loss of appetite, or other constitutional symptoms.  Pt states good ability with ADL's, has low fall risk, home safety reviewed and adequate, no other significant changes in hearing or vision, and only occasionally active with exercise.  Does have night snoring, breathing impaired per husband, and daytime somnolence most days.   Past Medical History  Diagnosis Date  . GLUCOSE INTOLERANCE 05/24/2008  . HYPERLIPIDEMIA 09/23/2006  . ANXIETY 09/23/2006  . DEPRESSION 09/23/2006  . CARPAL TUNNEL SYNDROME, BILATERAL 09/23/2006  . HYPERTENSION 09/23/2006  . GERD 05/24/2008  . DIVERTICULOSIS, COLON 09/23/2006  . IBS 09/23/2006  . BUNION, RIGHT FOOT 09/18/2009  . OSTEOPOROSIS 05/24/2008  . Abdominal pain, epigastric 09/18/2009  . Impaired glucose tolerance 11/04/2010  . Anosmia 11/06/2010  . Parkinson's disease    Past Surgical History  Procedure Laterality Date  . Tubal ligation    . Nasal septum repair    . S/p ganglion cyst right wrist    . S/p bilat cts    . S/p right thumb trigger finger surgury    . S/p fibroid ablation    . Esophagogastroduodenoscopy  2008    Dr. Jeral Fruit duodenal ulcer  . Colonoscopy w/ biopsies  2008    Dr. Loreta Ave -negative random bxs    reports that she has never smoked. She has never used smokeless tobacco. She reports that she does not  drink alcohol or use illicit drugs. family history includes Heart attack in her mother; Heart disease (age of onset: 36) in her father; Hypertension in her father; Peripheral vascular disease in her mother. No Known Allergies Current Outpatient Prescriptions on File Prior to Visit  Medication Sig Dispense Refill  . ascorbic acid (VITAMIN C) 500 MG tablet Take 500 mg by mouth daily.        Marland Kitchen aspirin 81 MG tablet Take 81 mg by mouth daily.        Marland Kitchen atorvastatin (LIPITOR) 20 MG tablet Take 1 tablet (20 mg total) by mouth daily.  90 tablet  3  . calcium carbonate (OS-CAL) 600 MG TABS Take 600 mg by mouth daily.        Marland Kitchen dicyclomine (BENTYL) 20 MG tablet Take 1 tablet (20 mg total) by mouth every 6 (six) hours as needed (abdominal cramps, diarrhea).  120 tablet  0  . diltiazem (CARDIZEM CD) 300 MG 24 hr capsule Take 1 capsule (300 mg total) by mouth daily.  90 capsule  3  . DULoxetine (CYMBALTA) 60 MG capsule Take 1 capsule (60 mg total) by mouth daily.  90 capsule  3  . hydrocortisone (ANUSOL-HC) 25 MG suppository Place 1 suppository (25 mg total) rectally at bedtime. Do for seven nights and then as needed  24 suppository  0  . Multiple Vitamin (MULTIVITAMIN PO) Take by mouth daily.        . potassium chloride (KLOR-CON 10) 10 MEQ  tablet Take 1 tablet (10 mEq total) by mouth daily.  90 tablet  3  . Pramipexole Dihydrochloride (MIRAPEX ER) 2.25 MG TB24 Take 1 tablet by mouth daily.  90 tablet  3  . valsartan-hydrochlorothiazide (DIOVAN HCT) 320-12.5 MG per tablet Take 1 tablet by mouth daily.  90 tablet  3   No current facility-administered medications on file prior to visit.   Review of Systems Constitutional: Negative for diaphoresis, activity change, appetite change or unexpected weight change.  HENT: Negative for hearing loss, ear pain, facial swelling, mouth sores and neck stiffness.   Eyes: Negative for pain, redness and visual disturbance.  Respiratory: Negative for shortness of breath and  wheezing.   Cardiovascular: Negative for chest pain and palpitations.  Gastrointestinal: Negative for diarrhea, blood in stool, abdominal distention or other pain Genitourinary: Negative for hematuria, flank pain or change in urine volume.  Musculoskeletal: Negative for myalgias and joint swelling.  Skin: Negative for color change and wound.  Neurological: Negative for syncope and numbness. other than noted Hematological: Negative for adenopathy.  Psychiatric/Behavioral: Negative for hallucinations, self-injury, decreased concentration and agitation.      Objective:   Physical Exam BP 132/80  Pulse 109  Temp(Src) 98.1 F (36.7 C) (Oral)  Ht 4' 9.5" (1.461 m)  Wt 138 lb (62.596 kg)  BMI 29.33 kg/m2  SpO2 98% VS noted,  Constitutional: Pt is oriented to person, place, and time. Appears well-developed and well-nourished.  Head: Normocephalic and atraumatic.  Right Ear: External ear normal.  Left Ear: External ear normal.  Nose: Nose normal.  Mouth/Throat: Oropharynx is clear and moist.  Eyes: Conjunctivae and EOM are normal. Pupils are equal, round, and reactive to light.  Neck: Normal range of motion. Neck supple. No JVD present. No tracheal deviation present.  Cardiovascular: Normal rate, regular rhythm, normal heart sounds and intact distal pulses.   Pulmonary/Chest: Effort normal and breath sounds normal.  Abdominal: Soft. Bowel sounds are normal. There is no tenderness. No HSM  Musculoskeletal: Normal range of motion. Exhibits no edema.  Lymphadenopathy:  Has no cervical adenopathy.  Neurological: Pt is alert and oriented to person, place, and time. Pt has normal reflexes. No cranial nerve deficit.  Skin: Skin is warm and dry. No rash noted.  Psychiatric:  Has  normal mood and affect. Behavior is normal.     Assessment & Plan:

## 2013-01-01 NOTE — Assessment & Plan Note (Signed)

## 2013-01-01 NOTE — Patient Instructions (Signed)
You had the Prevnar pneumonia shot today Please continue all other medications as before, and refills have been done if requested. Please have the pharmacy call with any other refills you may need. Please continue your efforts at being more active, low cholesterol diet, and weight control. You are otherwise up to date with prevention measures today. Please keep your appointments with your specialists as you have planned  You will be contacted regarding the referral for: Pulmonary  Please return in 1 year for your yearly visit, or sooner if needed, with Lab testing done 3-5 days before

## 2013-01-01 NOTE — Assessment & Plan Note (Signed)
For pulm referral, r/o osa 

## 2013-01-06 ENCOUNTER — Encounter: Payer: Self-pay | Admitting: Pulmonary Disease

## 2013-01-06 ENCOUNTER — Ambulatory Visit (INDEPENDENT_AMBULATORY_CARE_PROVIDER_SITE_OTHER): Payer: Managed Care, Other (non HMO) | Admitting: Pulmonary Disease

## 2013-01-06 ENCOUNTER — Encounter: Payer: Self-pay | Admitting: Podiatry

## 2013-01-06 ENCOUNTER — Ambulatory Visit (INDEPENDENT_AMBULATORY_CARE_PROVIDER_SITE_OTHER): Payer: Managed Care, Other (non HMO) | Admitting: Podiatry

## 2013-01-06 ENCOUNTER — Ambulatory Visit (INDEPENDENT_AMBULATORY_CARE_PROVIDER_SITE_OTHER): Payer: Managed Care, Other (non HMO)

## 2013-01-06 VITALS — BP 124/68 | HR 100 | Ht <= 58 in | Wt 139.8 lb

## 2013-01-06 VITALS — BP 146/95 | HR 89 | Resp 16

## 2013-01-06 DIAGNOSIS — G4710 Hypersomnia, unspecified: Secondary | ICD-10-CM

## 2013-01-06 DIAGNOSIS — M2010 Hallux valgus (acquired), unspecified foot: Secondary | ICD-10-CM

## 2013-01-06 DIAGNOSIS — M21619 Bunion of unspecified foot: Secondary | ICD-10-CM

## 2013-01-06 NOTE — Assessment & Plan Note (Signed)
Differential diagnosis of sleep apnea his hypersomnolence due to medications such as Mirapex. She does not clearly give a history of REM behavior disorder, but I do note a history of recent vivid dreams and screaming and talking during sleep. We will proceed with a split night study. We will review REM sleep for increased muscle tone suggestive of REM behavior disorder.

## 2013-01-06 NOTE — Progress Notes (Signed)
Subjective:    Patient ID: Destiny Roth, female    DOB: 12-Jan-1951, 62 y.o.   MRN: [id number]  HPI  62 year old woman presents for evaluation of obstructive sleep apnea due to symptoms of night snoring, breathing impaired per husband, and daytime somnolence most days.  Husband has witnessed apneas especially on her back. She was diagnosed with Parkinson's one year ago and has been maintained on Mirapex with the dose increased in the last 3 months. Her heart rate has been noted to be high on recent visits. Epworth sleepiness score is 7/24. Bedtime is around 11 PM, sleep latency is up to 30 minutes, she sleeps on her back with 2 pillows. Her right shoulder hurts and she cannot sleep on her side. She sleeps soundly until 2 AM but then wakes up every hour until she is out of bed by 7:30 AM feeling tired. Over the last few months she reports vivid dreams and has woken up screaming, talking and swearing. She does not report acting out her dreams. Leg jerks have improved after increasing dose of Mirapex. She denies daytime naps but has felt better rested if she sleeps an additional 2 hours in the morning.  There is no history suggestive of cataplexy, sleep paralysis or parasomnias    Past Medical History  Diagnosis Date  . GLUCOSE INTOLERANCE 05/24/2008  . HYPERLIPIDEMIA 09/23/2006  . ANXIETY 09/23/2006  . DEPRESSION 09/23/2006  . CARPAL TUNNEL SYNDROME, BILATERAL 09/23/2006  . HYPERTENSION 09/23/2006  . GERD 05/24/2008  . DIVERTICULOSIS, COLON 09/23/2006  . IBS 09/23/2006  . BUNION, RIGHT FOOT 09/18/2009  . OSTEOPOROSIS 05/24/2008  . Abdominal pain, epigastric 09/18/2009  . Impaired glucose tolerance 11/04/2010  . Anosmia 11/06/2010  . Parkinson's disease     Past Surgical History  Procedure Laterality Date  . Tubal ligation    . Nasal septum repair    . S/p ganglion cyst right wrist    . S/p bilat cts    . S/p right thumb trigger finger surgury    . S/p fibroid ablation    .  Esophagogastroduodenoscopy  2008    Dr. Jeral Fruit duodenal ulcer  . Colonoscopy w/ biopsies  2008    Dr. Loreta Ave -negative random bxs    No Known Allergies  History   Social History  . Marital Status: Married    Spouse Name: N/A    Number of Children: 2  . Years of Education: N/A   Occupational History  . Not on file.   Social History Main Topics  . Smoking status: Never Smoker   . Smokeless tobacco: Never Used  . Alcohol Use: No  . Drug Use: No  . Sexual Activity: Yes    Partners: Male    Birth Control/ Protection: Post-menopausal   Other Topics Concern  . Not on file   Social History Narrative  . No narrative on file     Review of Systems  Constitutional: Negative for fever and unexpected weight change.  HENT: Positive for rhinorrhea. Negative for congestion, dental problem, ear pain, nosebleeds, postnasal drip, sinus pressure, sneezing, sore throat and trouble swallowing.   Eyes: Negative for redness and itching.  Respiratory: Negative for cough, chest tightness, shortness of breath and wheezing.   Cardiovascular: Negative for palpitations and leg swelling.  Gastrointestinal: Negative for nausea and vomiting.  Genitourinary: Negative for dysuria.  Musculoskeletal: Negative for joint swelling.  Skin: Negative for rash.  Neurological: Positive for headaches.  Hematological: Does not bruise/bleed easily.  Psychiatric/Behavioral: Positive for  dysphoric mood. The patient is nervous/anxious.        Objective:   Physical Exam  Gen. Pleasant, obese, in no distress, normal affect ENT - no lesions, no post nasal drip, class 2-3 airway Neck: No JVD, no thyromegaly, no carotid bruits Lungs: no use of accessory muscles, no dullness to percussion, decreased without rales or rhonchi  Cardiovascular: Rhythm regular, heart sounds  normal, no murmurs or gallops, no peripheral edema Abdomen: soft and non-tender, no hepatosplenomegaly, BS normal. Musculoskeletal: No  deformities, no cyanosis or clubbing Neuro:  alert, non focal, no tremors       Assessment & Plan:

## 2013-01-06 NOTE — Patient Instructions (Addendum)
Bunion (Hallux Valgus) A bony bump (protrusion) on the inside of the foot, at the base of the first toe, is called a bunion (hallux valgus). A bunion causes the first toe to angle toward the other toes. SYMPTOMS   A bony bump on the inside of the foot, causing an outward turning of the first toe. It may also overlap the second toe.  Thickening of the skin (callus) over the bony bump.  Fluid buildup under the callus. Fluid may become red, tender, and swollen (inflamed) with constant irritation or pressure.  Foot pain and stiffness. CAUSES  Many causes exist, including:  Inherited from your family (genetics).  Injury (trauma) forcing the first toe into a position in which it overlaps other toes.  Bunions are also associated with wearing shoes that have a narrow toe box (pointy shoes). RISK INCREASES WITH:  Family history of foot abnormalities, especially bunions.  Arthritis.  Narrow shoes, especially high heels. PREVENTION  Wear shoes with a wide toe box.  Avoid shoes with high heels.  Wear a small pad between the big toe and second toe.  Maintain proper conditioning:  Foot and ankle flexibility.  Muscle strength and endurance. PROGNOSIS  With proper treatment, bunions can typically be cured. Occasionally, surgery is required.  RELATED COMPLICATIONS   Infection of the bunion.  Arthritis of the first toe.  Risks of surgery, including infection, bleeding, injury to nerves (numb toe), recurrent bunion, overcorrection (toe points inward), arthritis of the big toe, big toe pointing upward, and bone not healing. TREATMENT  Treatment first consists of stopping the activities that aggravate the pain, taking pain medicines, and icing to reduce inflammation and pain. Wear shoes with a wide toe box. Shoes can be modified by a shoe repair person to relieve pressure on the bunion, especially if you cannot find shoes with a wide enough toe box. You may also place a pad with the  center cut out in your shoe, to reduce pressure on the bunion. Sometimes, an arch support (orthotic) may reduce pressure on the bunion and alleviate the symptoms. Stretching and strengthening exercises for the muscles of the foot may be useful. You may choose to wear a brace or pad at night to hold the big toe away from the second toe. If non-surgical treatments are not successful, surgery may be needed. Surgery involves removing the overgrown tissue and correcting the position of the first toe, by realigning the bones. Bunion surgery is typically performed on an outpatient basis, meaning you can go home the same day as surgery. The surgery may involve cutting the mid portion of the bone of the first toe, or just cutting and repairing (reconstructing) the ligaments and soft tissues around the first toe.  MEDICATION   If pain medicine is needed, nonsteroidal anti-inflammatory medicines, such as aspirin and ibuprofen, or other minor pain relievers, such as acetaminophen, are often recommended.  Do not take pain medicine for 7 days before surgery.  Prescription pain relievers are usually only prescribed after surgery. Use only as directed and only as much as you need.  Ointments applied to the skin may be helpful. HEAT AND COLD  Cold treatment (icing) relieves pain and reduces inflammation. Cold treatment should be applied for 10 to 15 minutes every 2 to 3 hours for inflammation and pain and immediately after any activity that aggravates your symptoms. Use ice packs or an ice massage.  Heat treatment may be used prior to performing the stretching and strengthening activities prescribed by your   caregiver, physical therapist, or athletic trainer. Use a heat pack or a warm soak. SEEK MEDICAL CARE IF:   Symptoms get worse or do not improve in 2 weeks, despite treatment.  After surgery, you develop fever, increasing pain, redness, swelling, drainage of fluids, bleeding, or increasing warmth around the  surgical area.  New, unexplained symptoms develop. (Drugs used in treatment may produce side effects.) Document Released: 01/14/2005 Document Revised: 04/08/2011 Document Reviewed: 04/28/2008 [employer] Patient Information 2014 [employer]  Pre-Operative Instructions  Congratulations, you have decided to take an important step to improving your quality of life.  You can be assured that the doctors of [facility] will be with you every step of the way.  1. Plan to be at the surgery center/hospital at least 1 (one) hour prior to your scheduled time unless otherwise directed by the surgical center/hospital staff.  You must have a responsible adult accompany you, remain during the surgery and drive you home.  Make sure you have directions to the surgical center/hospital and know how to get there on time. 2. For hospital based surgery you will need to obtain a history and physical form from your family physician within 1 month prior to the date of surgery- we will give you a form for you primary physician.  3. We make every effort to accommodate the date you request for surgery.  There are however, times where surgery dates or times have to be moved.  We will contact you as soon as possible if a change in schedule is required.   4. No Aspirin/Ibuprofen for one week before surgery.  If you are on aspirin, any non-steroidal anti-inflammatory medications (Mobic, Aleve, Ibuprofen) you should stop taking it 7 days prior to your surgery.  You make take Tylenol  For pain prior to surgery.  5. Medications- If you are taking daily heart and blood pressure medications, seizure, reflux, allergy, asthma, anxiety, pain or diabetes medications, make sure the surgery center/hospital is aware before the day of surgery so they may notify you which medications to take or avoid the day of surgery. 6. No food or drink after midnight the night before surgery unless directed otherwise by surgical center/hospital  staff. 7. No alcoholic beverages 24 hours prior to surgery.  No smoking 24 hours prior to or 24 hours after surgery. 8. Wear loose pants or shorts- loose enough to fit over bandages, boots, and casts. 9. No slip on shoes, sneakers are best. 10. Bring your boot with you to the surgery center/hospital.  Also bring crutches or a walker if your physician has prescribed it for you.  If you do not have this equipment, it will be provided for you after surgery. 11. If you have not been contracted by the surgery center/hospital by the day before your surgery, call to confirm the date and time of your surgery. 12. Leave-time from work may vary depending on the type of surgery you have.  Appropriate arrangements should be made prior to surgery with your employer. 13. Prescriptions will be provided immediately following surgery by your doctor.  Have these filled as soon as possible after surgery and take the medication as directed. 14. Remove nail polish on the operative foot. 15. Wash the night before surgery.  The night before surgery wash the foot and leg well with the antibacterial soap provided and water paying special attention to beneath the toenails and in between the toes.  Rinse thoroughly with water and dry well with a towel.    Perform this wash unless told not to do so by your physician.  Enclosed: 1 Ice pack (please put in freezer the night before surgery)   1 Hibiclens skin cleaner   Pre-op Instructions  If you have any questions regarding the instructions, do not hesitate to call our office.  [place]: [address] [place], [place] [address] [phone]  [place]: [address] , [place] [address] [phone]  [place]: [address]  [place], [place] [address] [phone]  Dr. Richard Tuchman DPM, Dr. Norman Regal DPM Dr. Richard Sikora DPM, Dr. M. Todd Hyatt DPM, Dr. Kathryn Egerton DPM 

## 2013-01-06 NOTE — Patient Instructions (Signed)
Schedule sleep study

## 2013-01-06 NOTE — Progress Notes (Signed)
Subjective:     Patient ID: Destiny Roth, female   DOB: [birth year], 62 y.o.   MRN: [id number]  HPI patient presents stating I am ready to get my bunions fixed that I thought about last year. States they are becoming increasingly symptomatic and making shoe gear increasingly uncomfortable for her and the right one hurts more than the left and she has tried shoe modifications and soaks   Review of Systems     Objective:   Physical Exam Neurovascular status intact with muscle strength adequate and no equinus condition noted. 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 She wants to have this done and at this time I allowed her to read consent form for correction going over all possible complications and the fact that total recovery. We'll take 6 months to one year patient understands wants surgery and signs consent form is dispensed air fracture walker with instructions on usage

## 2013-01-28 DIAGNOSIS — M8580 Other specified disorders of bone density and structure, unspecified site: Secondary | ICD-10-CM

## 2013-01-28 HISTORY — DX: Other specified disorders of bone density and structure, unspecified site: M85.80

## 2013-01-28 HISTORY — PX: SHOULDER SURGERY: SHX246

## 2013-01-28 HISTORY — PX: FOOT SURGERY: SHX648

## 2013-02-01 ENCOUNTER — Ambulatory Visit (HOSPITAL_BASED_OUTPATIENT_CLINIC_OR_DEPARTMENT_OTHER): Payer: Managed Care, Other (non HMO) | Attending: Pulmonary Disease | Admitting: Radiology

## 2013-02-01 VITALS — Ht <= 58 in | Wt 138.0 lb

## 2013-02-01 DIAGNOSIS — G20 Parkinson's disease: Secondary | ICD-10-CM | POA: Insufficient documentation

## 2013-02-01 DIAGNOSIS — G4733 Obstructive sleep apnea (adult) (pediatric): Secondary | ICD-10-CM

## 2013-02-01 DIAGNOSIS — Z79899 Other long term (current) drug therapy: Secondary | ICD-10-CM | POA: Insufficient documentation

## 2013-02-01 DIAGNOSIS — G4761 Periodic limb movement disorder: Secondary | ICD-10-CM | POA: Insufficient documentation

## 2013-02-01 DIAGNOSIS — R0609 Other forms of dyspnea: Secondary | ICD-10-CM | POA: Insufficient documentation

## 2013-02-01 DIAGNOSIS — R0989 Other specified symptoms and signs involving the circulatory and respiratory systems: Secondary | ICD-10-CM | POA: Insufficient documentation

## 2013-02-01 DIAGNOSIS — G20A1 Parkinson's disease without dyskinesia, without mention of fluctuations: Secondary | ICD-10-CM | POA: Insufficient documentation

## 2013-02-01 DIAGNOSIS — G4710 Hypersomnia, unspecified: Secondary | ICD-10-CM

## 2013-02-08 ENCOUNTER — Ambulatory Visit (INDEPENDENT_AMBULATORY_CARE_PROVIDER_SITE_OTHER): Payer: Managed Care, Other (non HMO) | Admitting: Neurology

## 2013-02-08 ENCOUNTER — Encounter: Payer: Self-pay | Admitting: Neurology

## 2013-02-08 VITALS — BP 154/88 | HR 110 | Temp 98.1°F | Resp 16 | Ht <= 58 in | Wt 139.3 lb

## 2013-02-08 DIAGNOSIS — G20 Parkinson's disease: Secondary | ICD-10-CM

## 2013-02-08 NOTE — Progress Notes (Signed)
Subjective:   Destiny Roth was seen in f/u for PD diagnosed 10/2011.  The patient is a 63 y.o. right handed female with a history of tremor.Onset of symptoms was at end of 2012.   She has noted a gradual loss of smell and taste over the last 20 years.  She even had a rhinoplasty and septoplasty in 1993 to see if that would help and it did not.  She can smell almost nothing now.    She is on Mirapex ER 1.5 mg daily.  The patient states that the tremor is virtually gone, with the exception of when she is stressed at work and then she will notice tremor in both legs.  Her walking is much better.  She feels more stable.  .  She does have some dizziness in the early morning and is unsure if that is related to Mirapex, her blood pressure medication or potassium.  No compulsive behaviors have been noted with the Mirapex.  There have been no sleep attacks.  She has not yet started exercising.  She is going to be changing jobs which is stressful for her.  10/12/12 update:  She has had some tremor internally and in the legs.  She notices some word finding trouble.   She has been under increased stress.  Her husband had back surgery 2 weeks ago and she has been the primary caregiver.  In addition, she is between jobs and needs to find a new job.  She would like to see if she can get off her blood pressure medication.  02/08/13 update:  Last visit, I increased the patient's Mirapex to a total of 2.25 mg daily.  The patient states that it definitely helped the tremor.  She almost never sees tremor any more.  She is, however, complaining about weight gain.  Her weight has been stable for many months, but when we looked back she gained weight between her May, 2014 visit and her September, 2014 visit.  She is frustrated that she cannot get that off.  In addition, she complains of some low energy and vaginal dryness.  I did look at her prior medical records since last visit.  She was complaining to her primary care  physician about dizziness and her blood pressure medication was decreased.  She did see Dr. Elsworth Soho since last visit and a split-night sleep study was recommended.  She states that she is scheduled to have foot surgery for a bunion on February 17 2013.  She had a procedure this morning to "break up a calcification" in the right shoulder.  Wearing off:  no  How long before next dose:  n/a Falls:   no N/V:  no Hallucinations:  no  visual distortions: no Lightheaded:  yes  Syncope: no Dyskinesia:  no Compulsive behaviors:  no   Current/Previously tried tremor medications: Klonopin  Current medications that may exacerbate tremor:  ?Cymbalta.  I reviewed the MRI of the brain done at the end of 2013 with her.  This revealed some mild small vessel disease, likely secondary to hypertension.  Otherwise, it was unremarkable.  The small vessel disease was not within the basal ganglia.  I showed her the films in the office today.   No Known Allergies  Current Outpatient Prescriptions on File Prior to Visit  Medication Sig Dispense Refill  . ascorbic acid (VITAMIN C) 500 MG tablet Take 500 mg by mouth daily. WHEN SHE REMEMBERS      . aspirin 81 MG tablet  Take 81 mg by mouth daily.        Marland Kitchen atorvastatin (LIPITOR) 20 MG tablet Take 1 tablet (20 mg total) by mouth daily.  90 tablet  3  . calcium carbonate (OS-CAL) 600 MG TABS Take 600 mg by mouth daily. WHEN SHE REMEMBERS      . cycloSPORINE (RESTASIS) 0.05 % ophthalmic emulsion 1 drop 2 (two) times daily.      Marland Kitchen dicyclomine (BENTYL) 20 MG tablet Take 1 tablet (20 mg total) by mouth every 6 (six) hours as needed (abdominal cramps, diarrhea).  120 tablet  0  . diltiazem (CARDIZEM CD) 300 MG 24 hr capsule Take 1 capsule (300 mg total) by mouth daily.  90 capsule  3  . DULoxetine (CYMBALTA) 60 MG capsule Take 1 capsule (60 mg total) by mouth daily.  90 capsule  3  . hydrocortisone (ANUSOL-HC) 25 MG suppository Place 1 suppository (25 mg total) rectally at  bedtime. Do for seven nights and then as needed  24 suppository  0  . Multiple Vitamin (MULTIVITAMIN PO) Take by mouth daily. WHEN SHE REMEMBERS      . NON FORMULARY ZZZYQUIL AS NEEDED      . potassium chloride (KLOR-CON 10) 10 MEQ tablet Take 1 tablet (10 mEq total) by mouth daily.  90 tablet  3  . Pramipexole Dihydrochloride (MIRAPEX ER) 2.25 MG TB24 Take 1 tablet by mouth daily.  90 tablet  3  . valsartan-hydrochlorothiazide (DIOVAN HCT) 320-12.5 MG per tablet Take 1 tablet by mouth daily.  90 tablet  3   No current facility-administered medications on file prior to visit.    Past Medical History  Diagnosis Date  . GLUCOSE INTOLERANCE 05/24/2008  . HYPERLIPIDEMIA 09/23/2006  . ANXIETY 09/23/2006  . DEPRESSION 09/23/2006  . CARPAL TUNNEL SYNDROME, BILATERAL 09/23/2006  . HYPERTENSION 09/23/2006  . GERD 05/24/2008  . DIVERTICULOSIS, COLON 09/23/2006  . IBS 09/23/2006  . BUNION, RIGHT FOOT 09/18/2009  . OSTEOPOROSIS 05/24/2008  . Abdominal pain, epigastric 09/18/2009  . Impaired glucose tolerance 11/04/2010  . Anosmia 11/06/2010  . Parkinson's disease     Past Surgical History  Procedure Laterality Date  . Tubal ligation    . Nasal septum repair    . S/p ganglion cyst right wrist    . S/p bilat cts    . S/p right thumb trigger finger surgury    . S/p fibroid ablation    . Esophagogastroduodenoscopy  2008    Dr. Harriette Bouillon duodenal ulcer  . Colonoscopy w/ biopsies  2008    Dr. Collene Mares -negative random bxs  . Cataract surgery    . Cataract    . Cararact      History   Social History  . Marital Status: Married    Spouse Name: N/A    Number of Children: 2  . Years of Education: N/A   Occupational History  . Not on file.   Social History Main Topics  . Smoking status: Never Smoker   . Smokeless tobacco: Never Used  . Alcohol Use: Yes     Comment: Occ  . Drug Use: No  . Sexual Activity: Yes    Partners: Male    Birth Control/ Protection: Post-menopausal   Other Topics  Concern  . Not on file   Social History Narrative  . No narrative on file    Family Status  Relation Status Death Age  . Father Deceased 61    MI, hypertension since teens  . Mother Deceased  2003, PVD, [place], [place]  . Brother Alive     3, HTN  . Child Alive     2, alive and well  . Maternal Grandmother Deceased     PD    Review of Systems Intermittent diarrhea and constipation with IBS.  A complete 10 system ROS was obtained and was negative apart from what is mentioned.   Objective:   VITALS:   Filed Vitals:   02/08/13 1448  BP: 154/88  Pulse: 110  Temp: 98.1 F (36.7 C)  Resp: 16  Height: 4\' 9"  (1.448 m)  Weight: 139 lb 4.8 oz (63.186 kg)   Wt Readings from Last 3 Encounters:  02/08/13 139 lb 4.8 oz (63.186 kg)  02/01/13 138 lb (62.596 kg)  01/06/13 139 lb 12.8 oz (63.413 kg)    GEN:  The patient appears stated age (actually younger) and is in NAD. HEENT:  Normocephalic, atraumatic.  The mucous membranes are moist. The superficial temporal arteries are without ropiness or tenderness. CV:  RRR Lungs:  CTAB Neck/HEME:  There are no carotid bruits bilaterally.  Neurological examination:  Orientation: The patient is alert and oriented x3. Fund of knowledge is appropriate.  Recent and remote memory are intact.  Attention and concentration are normal.    Able to name objects and repeat phrases. Cranial nerves: There is good facial symmetry.   Extraocular muscles are intact.  There are no square wave jerks. The speech is fluent and clear. Soft palate rises symmetrically and there is no tongue deviation. Hearing is intact to conversational tone. Sensation: Sensation is intact to light to light touch throughout. Motor: Strength is 5/5 in the bilateral upper and lower extremities.   Shoulder shrug is equal and symmetric.  There is no pronator drift. Deep tendon reflexes: Deep tendon reflexes are 3+/4 at the bilateral biceps, triceps, brachioradialis, patella and  achilles. Plantar responses are downgoing bilaterally.  Movement examination: Tone: There is only mild increased tone in the left upper extremity today.  The tone in the right upper extremity today was normal. Abnormal movements: There was no tremor today even with distraction procedures. Coordination:  There is normal RAM's today Gait and Station:    Her gait had a fairly normal stride length today.  Arm swing was good.  She is very stable.  LABS:  Lab Results  Component Value Date   WBC 8.8 12/29/2012   HGB 14.9 12/29/2012   HCT 43.7 12/29/2012   MCV 88.1 12/29/2012   PLT 357.0 12/29/2012     Chemistry      Component Value Date/Time   NA 137 12/29/2012 1003   K 3.8 12/29/2012 1003   CL 100 12/29/2012 1003   CO2 29 12/29/2012 1003   BUN 24* 12/29/2012 1003   CREATININE 0.9 12/29/2012 1003      Component Value Date/Time   CALCIUM 9.7 12/29/2012 1003   ALKPHOS 63 12/29/2012 1003   AST 23 12/29/2012 1003   ALT 33 12/29/2012 1003   BILITOT 0.7 12/29/2012 1003     Lab Results  Component Value Date   WUXLKGMW10 272 11/26/2011   Lab Results  Component Value Date   FOLATE 13.5 11/26/2011   Lab Results  Component Value Date   TSH 0.79 12/29/2012        Assessment/Plan:   1.  Akinetic rigid Parkinson's disease.  -I am going to continue her Mirapex 2.25 mg daily. .  Risks, benefits, side effects and alternative therapies were discussed.  The opportunity to  ask questions was given and they were answered to the best of my ability.  The patient expressed understanding and willingness to follow the outlined treatment protocols.  -The patient and I discussed the value of moderate cardiovascular exercise.  She does not wish to pursue PT at this time. 2.  HTN  -her BP med has been lowered because of dizziness.  BP was high today but the pt thought that was because she had a procedure today.  We will need to monitor closely. 3.  Depression  -This is common in patients with Parkinson's disease  and she should remain on the Cymbalta, which is working well for her. 4.  Weight gain  -I emailed her gynecologist re: energy levels, vaginal dryness, weight gain.  She may need a f/u appointment with them.

## 2013-02-09 ENCOUNTER — Telehealth: Payer: Self-pay | Admitting: Pulmonary Disease

## 2013-02-09 DIAGNOSIS — G4733 Obstructive sleep apnea (adult) (pediatric): Secondary | ICD-10-CM

## 2013-02-09 NOTE — Telephone Encounter (Signed)
I spoke with patient about results and she verbalized understanding and had no questions. She reports she is getting ready to have 2 surgeries and will call back once she is ready to schedule appt. Nothing further needed

## 2013-02-09 NOTE — Telephone Encounter (Signed)
PSG showed mild OSA - AHI 12/h- OV to discuss options further ? CPAP

## 2013-02-09 NOTE — Sleep Study (Addendum)
Destiny   NAME: Roth  DATE OF BIRTH: 10-Apr-1950  MEDICAL RECORD [id number]  LOCATION: [facility]   PHYSICIAN: ALVA,RAKESH V.   DATE OF STUDY: 02/01/2013   SLEEP STUDY TYPE: Nocturnal Polysomnogram   REFERRING PHYSICIAN: Rigoberto Noel, MD   INDICATION FOR STUDY:  63 year old woman presents for evaluation of obstructive sleep apnea due to symptoms of night snoring, breathing impaired per husband, and daytime somnolence most days. Husband has witnessed apneas especially on her back She was diagnosed with Parkinson's one year ago and has been maintained on Mirapex with the dose increased in the last 3 months. Leg jerks have improved after increasing dose of Mirapex. She has a history of recent vivid dreams and screaming and talking during sleep.    At the time of this study ,they weighed 138 pounds with a height of 4 ft 10 inches and the BMI of 29, neck size of 13 inches. Epworth sleepiness score was 5   This nocturnal polysomnogram was performed with a sleep technologist in attendance. EEG, EOG,EMG and respiratory parameters recorded. Sleep stages, arousals, limb movements and respiratory data was scored according to criteria laid out by the American Academy of sleep medicine.   SLEEP ARCHITECTURE: Lights out was at 22-49 PM and lights on was at 4-53 AM. Total sleep time was 323 minutes with a sleep period time of 344 minutes and a sleep efficiency of 89 %. Sleep latency was 19 minutes with latency to REM sleep of 251 minutes and wake after sleep onset of 21 minutes. . Sleep stages as a percentage of total sleep time was N1 -4 %,N2- 78 % and REM sleep 10 % ( 32 minutes) . The longest period of REM sleep was around 3-30 AM.   AROUSAL DATA : There were 150  arousals with an arousal index of 28 events per hour. Most of these were spontaneous & 20 were associated with respiratory events  RESPIRATORY DATA: There were 27  obstructive apneas, 2 central apneas, 4 mixed apneas and 33 hypopneas with apnea -hypopnea index of 12 events per hour, REM AHI was 36/h. There were 58 RERAs with an RDI of 23 events per hour. There was no relation to sleep stage or body position. Supine sleep was  Noted x323 mins  MOVEMENT/PARASOMNIA: There were 20 PLMS with a PLM index of 4 events per hour. The PLM arousal index was 0.4 per hour.  OXYGEN DATA: The lowest desaturation was 83 % during NREM sleep and the desaturation index was 12 per hour.   CARDIAC DATA: The low heart rate was 66 beats per minute. The high heart rate recorded was an artifact. No arrhythmias were noted   DISCUSSION -Moderate snoring was noted . She did not meet criteria for CPAP intervention. She was desensitized with a  Small full face mask  IMPRESSION :  1. Mild-moderate obstructive sleep apnea with hypopneas causing sleep fragmentation and mild oxygen desaturation.Events were worse during REM sleep . 2. No evidence of cardiac arrhythmias or behavioral disturbance during sleep.  3. Sleep efficiency was good 4. periodic limb movements appear controlled on higher dose of mirapex 5. No evidence of increased muscle tone during REM sleep  RECOMMENDATION:  1. Treatment options for this degree of sleep disordered breathing include  CPAP therapy and/ or oral appliance.  2. Patient should be cautioned against driving when sleepy  3. They should be asked to avoid medications with sedative side effects  ALVA,RAKESH V.  Diplomate, [employer] of Sleep Medicine    ELECTRONICALLY SIGNED ON: 02/09/2013  [facility]  PH: [phone] FX: [phone]  [place]

## 2013-02-13 ENCOUNTER — Encounter: Payer: Self-pay | Admitting: Neurology

## 2013-02-15 ENCOUNTER — Encounter: Payer: Managed Care, Other (non HMO) | Admitting: Podiatry

## 2013-02-17 ENCOUNTER — Encounter: Payer: Self-pay | Admitting: Podiatry

## 2013-02-17 DIAGNOSIS — M2010 Hallux valgus (acquired), unspecified foot: Secondary | ICD-10-CM

## 2013-02-22 ENCOUNTER — Telehealth: Payer: Self-pay | Admitting: *Deleted

## 2013-02-22 NOTE — Telephone Encounter (Signed)
Called patient- she is recovering from foot surgery and she does want to talk to the doctor about her symptoms. Patient to call to schedule an appoinment when she is feeling a little better.

## 2013-02-22 NOTE — Telephone Encounter (Signed)
Message copied by Romana Juniper on Mon Feb 22, 2013 11:19 AM ------      Message from: Lahoma Crocker      Created: Sat Feb 13, 2013  8:15 PM       Contact pt to offer appointment.       ----- Message -----         From: Eustace Quail Tat, DO         Sent: 02/08/2013   4:45 PM           To: Lahoma Crocker, MD            Hello.  I am the movement d/o neurologist seeing Ms. Andonian.  From a parkinson's standpoint, she looks really good.  However, today she was c/o vaginal dryness, weight gain, poor energy.  I really don't think that these are related to her PD meds but also didn't want to d/c them as they are working so well.  She told me that she really hasn't spoken about these a lot with you and she asked me to ask you if there is anything from your end that could be done.  I told her that you may need a visit to discuss but I would relay the concerns.            Thanks!            Alonza Bogus, DO      Director of [facility]             ------

## 2013-02-24 ENCOUNTER — Encounter: Payer: Self-pay | Admitting: Internal Medicine

## 2013-02-24 ENCOUNTER — Encounter: Payer: Managed Care, Other (non HMO) | Admitting: Podiatry

## 2013-02-24 ENCOUNTER — Encounter: Payer: Self-pay | Admitting: Podiatry

## 2013-02-24 ENCOUNTER — Ambulatory Visit (INDEPENDENT_AMBULATORY_CARE_PROVIDER_SITE_OTHER): Payer: Managed Care, Other (non HMO) | Admitting: Podiatry

## 2013-02-24 ENCOUNTER — Ambulatory Visit (INDEPENDENT_AMBULATORY_CARE_PROVIDER_SITE_OTHER): Payer: Managed Care, Other (non HMO) | Admitting: Internal Medicine

## 2013-02-24 ENCOUNTER — Telehealth: Payer: Self-pay

## 2013-02-24 ENCOUNTER — Ambulatory Visit (INDEPENDENT_AMBULATORY_CARE_PROVIDER_SITE_OTHER)
Admission: RE | Admit: 2013-02-24 | Discharge: 2013-02-24 | Disposition: A | Discharge status: Home / Self Care | Payer: Managed Care, Other (non HMO) | Source: Ambulatory Visit | Attending: Internal Medicine | Admitting: Internal Medicine

## 2013-02-24 ENCOUNTER — Ambulatory Visit (INDEPENDENT_AMBULATORY_CARE_PROVIDER_SITE_OTHER): Payer: Managed Care, Other (non HMO)

## 2013-02-24 VITALS — BP 140/90 | HR 120 | Temp 99.3°F | Resp 16 | Wt 138.0 lb

## 2013-02-24 VITALS — BP 129/76 | HR 120 | Resp 12

## 2013-02-24 DIAGNOSIS — R509 Fever, unspecified: Secondary | ICD-10-CM

## 2013-02-24 DIAGNOSIS — Z0000 Encounter for general adult medical examination without abnormal findings: Secondary | ICD-10-CM

## 2013-02-24 DIAGNOSIS — R000 Tachycardia, unspecified: Secondary | ICD-10-CM | POA: Insufficient documentation

## 2013-02-24 DIAGNOSIS — Z9889 Other specified postprocedural states: Secondary | ICD-10-CM

## 2013-02-24 DIAGNOSIS — M2010 Hallux valgus (acquired), unspecified foot: Secondary | ICD-10-CM

## 2013-02-24 LAB — URINALYSIS
BILIRUBIN URINE: NEGATIVE
Hgb urine dipstick: NEGATIVE
Ketones, ur: 15 — AB
LEUKOCYTES UA: NEGATIVE
NITRITE: NEGATIVE
Specific Gravity, Urine: >=1.03 — AB (ref 1.000–1.030)
TOTAL PROTEIN, URINE-UPE24: NEGATIVE
Urine Glucose: NEGATIVE
Urobilinogen, UA: 0.2 (ref 0.0–1.0)
pH: 5.5 (ref 5.0–8.0)

## 2013-02-24 LAB — LIPID PANEL
CHOL/HDL RATIO: 4
Cholesterol: 186 mg/dL (ref 0–200)
HDL: 43.9 mg/dL (ref >39.00)
VLDL: 101.4 mg/dL — ABNORMAL HIGH (ref 0.0–40.0)

## 2013-02-24 LAB — CBC WITH DIFFERENTIAL/PLATELET
BASOS ABS: 0 10*3/uL (ref 0.0–0.1)
Basophils Relative: 0.3 % (ref 0.0–3.0)
Eosinophils Absolute: 0 10*3/uL (ref 0.0–0.7)
Eosinophils Relative: 0.7 % (ref 0.0–5.0)
HCT: 43.8 % (ref 36.0–46.0)
Hemoglobin: 14.6 g/dL (ref 12.0–15.0)
LYMPHS PCT: 18.1 % (ref 12.0–46.0)
Lymphs Abs: 1 10*3/uL (ref 0.7–4.0)
MCHC: 33.4 g/dL (ref 30.0–36.0)
MCV: 90.9 fl (ref 78.0–100.0)
MONOS PCT: 6.1 % (ref 3.0–12.0)
Monocytes Absolute: 0.3 10*3/uL (ref 0.1–1.0)
Neutro Abs: 4 10*3/uL (ref 1.4–7.7)
Neutrophils Relative %: 74.8 % (ref 43.0–77.0)
PLATELETS: 372 10*3/uL (ref 150.0–400.0)
RBC: 4.82 Mil/uL (ref 3.87–5.11)
RDW: 12.8 % (ref 11.5–14.6)
WBC: 5.3 10*3/uL (ref 4.5–10.5)

## 2013-02-24 LAB — BASIC METABOLIC PANEL
BUN: 22 mg/dL (ref 6–23)
CO2: 26 mEq/L (ref 19–32)
Calcium: 9.8 mg/dL (ref 8.4–10.5)
Chloride: 101 mEq/L (ref 96–112)
Creatinine, Ser: 1 mg/dL (ref 0.4–1.2)
GFR: 61.37 mL/min (ref >60.00)
GLUCOSE: 133 mg/dL — AB (ref 70–99)
Potassium: 3.6 mEq/L (ref 3.5–5.1)
SODIUM: 136 meq/L (ref 135–145)

## 2013-02-24 LAB — HEPATIC FUNCTION PANEL
ALK PHOS: 69 U/L (ref 39–117)
ALT: 27 U/L (ref 0–35)
AST: 23 U/L (ref 0–37)
Albumin: 3.8 g/dL (ref 3.5–5.2)
BILIRUBIN DIRECT: 0 mg/dL (ref 0.0–0.3)
BILIRUBIN TOTAL: 1 mg/dL (ref 0.3–1.2)
TOTAL PROTEIN: 7.6 g/dL (ref 6.0–8.3)

## 2013-02-24 LAB — URINALYSIS, ROUTINE W REFLEX MICROSCOPIC
Bilirubin Urine: NEGATIVE
Hgb urine dipstick: NEGATIVE
Ketones, ur: 15 — AB
Leukocytes, UA: NEGATIVE
Nitrite: NEGATIVE
Total Protein, Urine: NEGATIVE
UROBILINOGEN UA: 0.2 (ref 0.0–1.0)
Urine Glucose: NEGATIVE
pH: 5.5 (ref 5.0–8.0)

## 2013-02-24 LAB — TSH: TSH: 1.08 u[IU]/mL (ref 0.35–5.50)

## 2013-02-24 LAB — T4, FREE: FREE T4: 0.85 ng/dL (ref 0.60–1.60)

## 2013-02-24 LAB — MAGNESIUM: MAGNESIUM: 1.9 mg/dL (ref 1.5–2.5)

## 2013-02-24 MED ORDER — METOPROLOL SUCCINATE ER 25 MG PO TB24: 25.0000 mg | ORAL_TABLET | Freq: Every day | ORAL | Status: DC

## 2013-02-24 MED ORDER — PRAMIPEXOLE DIHYDROCHLORIDE ER 2.25 MG PO TB24: ORAL_TABLET | ORAL | Status: DC

## 2013-02-24 NOTE — Progress Notes (Signed)
   Subjective:    Palpitations  This is a new problem. The current episode started in the past 7 days (started after a bunion surgery). The problem occurs constantly. The problem has been unchanged. The symptoms are aggravated by caffeine and exercise. Associated symptoms include chest fullness and shortness of breath. Pertinent negatives include no chest pain, coughing, dizziness, nausea, numbness or weakness. She has tried nothing for the symptoms. There is no history of anemia or hyperthyroidism.      Review of Systems  Constitutional: Negative for chills, activity change, appetite change, fatigue and unexpected weight change.  HENT: Negative for congestion, mouth sores and sinus pressure.   Eyes: Negative for visual disturbance.  Respiratory: Positive for shortness of breath. Negative for cough, chest tightness and wheezing.   Cardiovascular: Positive for palpitations. Negative for chest pain.  Gastrointestinal: Negative for nausea and abdominal pain.  Genitourinary: Negative for frequency, difficulty urinating and vaginal pain.  Musculoskeletal: Negative for back pain and gait problem.  Skin: Negative for pallor and rash.  Neurological: Negative for dizziness, tremors, weakness, numbness and headaches.  Psychiatric/Behavioral: Negative for confusion and sleep disturbance.   BP Readings from Last 3 Encounters:  02/24/13 140/90  02/24/13 129/76  02/08/13 154/88   Wt Readings from Last 3 Encounters:  02/24/13 138 lb (62.596 kg)  02/08/13 139 lb 4.8 oz (63.186 kg)  02/01/13 138 lb (62.596 kg)        Objective:   Physical Exam  Constitutional: She appears well-developed. No distress.  HENT:  Head: Normocephalic.  Right Ear: External ear normal.  Left Ear: External ear normal.  Nose: Nose normal.  Mouth/Throat: Oropharynx is clear and moist.  Eyes: Conjunctivae are normal. Pupils are equal, round, and reactive to light. Right eye exhibits no discharge. Left eye exhibits no  discharge.  Neck: Normal range of motion. Neck supple. No JVD present. No tracheal deviation present. No thyromegaly present.  Cardiovascular: Regular rhythm and normal heart sounds.   tachycardic  Pulmonary/Chest: No stridor. No respiratory distress. She has no wheezes.  Abdominal: Soft. Bowel sounds are normal. She exhibits no distension and no mass. There is no tenderness. There is no rebound and no guarding.  Musculoskeletal: She exhibits no edema and no tenderness.  Boot on L LE  Lymphadenopathy:    She has no cervical adenopathy.  Neurological: She displays normal reflexes. No cranial nerve deficit. She exhibits normal muscle tone. Coordination normal.  Skin: No rash noted. No erythema.  Psychiatric: She has a normal mood and affect. Her behavior is normal. Judgment and thought content normal.    EKG Lab Results  Component Value Date   WBC 8.8 12/29/2012   HGB 14.9 12/29/2012   HCT 43.7 12/29/2012   PLT 357.0 12/29/2012   GLUCOSE 96 12/29/2012   CHOL 173 12/29/2012   TRIG 112.0 12/29/2012   HDL 74.20 12/29/2012   LDLDIRECT 79.5 11/08/2011   LDLCALC 76 12/29/2012   ALT 33 12/29/2012   AST 23 12/29/2012   NA 137 12/29/2012   K 3.8 12/29/2012   CL 100 12/29/2012   CREATININE 0.9 12/29/2012   BUN 24* 12/29/2012   CO2 29 12/29/2012   TSH 0.79 12/29/2012         Assessment & Plan:

## 2013-02-24 NOTE — Telephone Encounter (Signed)
Labs entered for cpx

## 2013-02-24 NOTE — Progress Notes (Signed)
Pre visit review using our clinic review tool, if applicable. No additional management support is needed unless otherwise documented below in the visit note. 

## 2013-02-24 NOTE — Patient Instructions (Addendum)
Call your primary care physician today to have your rapid heartbeat evaluated. It is seated position remove bandage, boot and shower. Reapply gauze Ace wrap and boot and exit chart with assistance. Continue to sleep and walk with the boot.  In a seated position remove boot and move foot up and down to exercise calf muscle 3 times a day x2 minutes

## 2013-02-24 NOTE — Assessment & Plan Note (Addendum)
1/15 Chronic, worse lately EKG CXR Labs Added Metoprolol To ER if worse Card ref - Dr Harrington Challenger

## 2013-02-24 NOTE — Progress Notes (Signed)
Patient ID: Destiny Roth, female   DOB: 11/24/1950, 63 y.o.   MRN: [id number] Subjective: This patient presents postop Liane Comber bunionectomy 02/17/2013 performed by DR. Regal. Patient having minimal discomfort at this time. She is not use any pain medication for approximately 4 days. Her concern is her rapid heartbeat. She is taking no postop medications prescribed by DR. Regal and is maintain her regular medications.  Objective: 63 year old white female with BP of 129/76, pulse 120, respiration 12, temperature 98.9  The surgical incision on the left first MPJ is approximated with slight edema, no erythema or drainage noted. There is no left calf edema or left calf tenderness.  X-ray views weightbearing left foot  Left first metatarsal demonstrates an osteotomy in correct alignment with crossed internal K wire fixation.  Radiographic compression: Austin bunionectomy and satisfactory alignment with retained internal K wire fixation.  Assessment: Satisfactory appearance of the surgical site radiographically and clinically. No evidence of DVT Tachycardia appears unrelated to surgical treatment  Plan: Reapply light dry sterile compression dressing. Patient will continue to ambulate in her surgical boot. We discussed showering in a nonweightbearing position as well as dorsi and plantarflexion of the operated foot in a seated position to encourage circulation.  Advised patient to contact her primary care physician to have her tachycardia evaluated today. Reappoint x2 weeks

## 2013-02-24 NOTE — Assessment & Plan Note (Signed)
1/15 ?etiol -?post-op UA Tylenol prn

## 2013-02-25 ENCOUNTER — Encounter: Payer: Self-pay | Admitting: Internal Medicine

## 2013-02-25 LAB — LDL CHOLESTEROL, DIRECT: Direct LDL: 76.4 mg/dL

## 2013-02-26 ENCOUNTER — Encounter: Payer: Self-pay | Admitting: Internal Medicine

## 2013-02-26 ENCOUNTER — Ambulatory Visit (INDEPENDENT_AMBULATORY_CARE_PROVIDER_SITE_OTHER): Payer: Managed Care, Other (non HMO) | Admitting: Internal Medicine

## 2013-02-26 VITALS — BP 126/88 | HR 118 | Temp 97.7°F | Wt 138.0 lb

## 2013-02-26 DIAGNOSIS — I10 Essential (primary) hypertension: Secondary | ICD-10-CM

## 2013-02-26 DIAGNOSIS — I951 Orthostatic hypotension: Secondary | ICD-10-CM

## 2013-02-26 DIAGNOSIS — R509 Fever, unspecified: Secondary | ICD-10-CM

## 2013-02-26 DIAGNOSIS — R000 Tachycardia, unspecified: Secondary | ICD-10-CM

## 2013-02-26 MED ORDER — IRBESARTAN 300 MG PO TABS: 300.0000 mg | ORAL_TABLET | Freq: Every day | ORAL | Status: DC

## 2013-02-26 NOTE — Progress Notes (Signed)
Subjective:    Patient ID: Destiny Roth, female    DOB: 01/06/1951, 63 y.o.   MRN: [id number]  HPI  Here with of PD on mirapex, HTN, to f/u elev HR, with documented low grade temp last visit, s/p left foot surgury jan 20.  Has had borderline tachycardia several times in the past asympt, was noted per pt to have low grade fever jan 28 per Dr AVP in this ofice with with mild tachycardia, as well as HR 129 per pt noted at foot surgury visit f/u.  Today is 118, regular.  ECG last visit sinus tachycardia,  Pt has no signficant pain except for post op left foot discomfort, no overt s/s of cough, dysuria or other.  Admits to possibly mild decrease in po intake about the time of the postop, has taken several doses of vicodin but quit after 2 days as it "did not agree with me." Had cxr, ua and labs as documented essentialy neg with normal WBC.  Has been referred to Dr Ross/card. Past Medical History  Diagnosis Date  . GLUCOSE INTOLERANCE 05/24/2008  . HYPERLIPIDEMIA 09/23/2006  . ANXIETY 09/23/2006  . DEPRESSION 09/23/2006  . CARPAL TUNNEL SYNDROME, BILATERAL 09/23/2006  . HYPERTENSION 09/23/2006  . GERD 05/24/2008  . DIVERTICULOSIS, COLON 09/23/2006  . IBS 09/23/2006  . BUNION, RIGHT FOOT 09/18/2009  . OSTEOPOROSIS 05/24/2008  . Abdominal pain, epigastric 09/18/2009  . Impaired glucose tolerance 11/04/2010  . Anosmia 11/06/2010  . Parkinson's disease    Past Surgical History  Procedure Laterality Date  . Tubal ligation    . Nasal septum repair    . S/p ganglion cyst right wrist    . S/p bilat cts    . S/p right thumb trigger finger surgury    . S/p fibroid ablation    . Esophagogastroduodenoscopy  2008    Dr. Harriette Bouillon duodenal ulcer  . Colonoscopy w/ biopsies  2008    Dr. Collene Mares -negative random bxs  . Cataract surgery    . Cataract    . Cararact      reports that she has never smoked. She has never used smokeless tobacco. She reports that she drinks alcohol. She reports that she does not  use illicit drugs. family history includes Heart attack in her mother; Heart disease (age of onset: 21) in her father; Hypertension in her father; Peripheral vascular disease in her mother. No Known Allergies Current Outpatient Prescriptions on File Prior to Visit  Medication Sig Dispense Refill  . aspirin 81 MG tablet Take 81 mg by mouth daily.        Marland Kitchen atorvastatin (LIPITOR) 20 MG tablet Take 1 tablet (20 mg total) by mouth daily.  90 tablet  3  . calcium carbonate (OS-CAL) 600 MG TABS Take 600 mg by mouth daily. WHEN SHE REMEMBERS      . cycloSPORINE (RESTASIS) 0.05 % ophthalmic emulsion 1 drop 2 (two) times daily.      Marland Kitchen dicyclomine (BENTYL) 20 MG tablet Take 1 tablet (20 mg total) by mouth every 6 (six) hours as needed (abdominal cramps, diarrhea).  120 tablet  0  . diltiazem (CARDIZEM CD) 300 MG 24 hr capsule Take 1 capsule (300 mg total) by mouth daily.  90 capsule  3  . DULoxetine (CYMBALTA) 60 MG capsule Take 1 capsule (60 mg total) by mouth daily.  90 capsule  3  . HYDROcodone-acetaminophen (NORCO/VICODIN) 5-325 MG per tablet       . hydrocortisone (ANUSOL-HC) 25 MG suppository  Place 1 suppository (25 mg total) rectally at bedtime. Do for seven nights and then as needed  24 suppository  0  . metoprolol succinate (TOPROL-XL) 25 MG 24 hr tablet Take 1 tablet (25 mg total) by mouth daily.  30 tablet  5  . Multiple Vitamin (MULTIVITAMIN PO) Take by mouth daily. WHEN SHE REMEMBERS      . NON FORMULARY ZZZYQUIL AS NEEDED      . potassium chloride (KLOR-CON 10) 10 MEQ tablet Take 1 tablet (10 mEq total) by mouth daily.  90 tablet  3  . Pramipexole Dihydrochloride (MIRAPEX ER) 2.25 MG TB24 1/2 tab a day  90 tablet  3  . valsartan-hydrochlorothiazide (DIOVAN HCT) 320-12.5 MG per tablet Take 1 tablet by mouth daily.  90 tablet  3   No current facility-administered medications on file prior to visit.    Review of Systems  Constitutional: Negative for unexpected weight change, or unusual  diaphoresis  HENT: Negative for tinnitus.   Eyes: Negative for photophobia and visual disturbance.  Respiratory: Negative for choking and stridor.   Gastrointestinal: Negative for vomiting and blood in stool.  Genitourinary: Negative for hematuria and decreased urine volume.  Musculoskeletal: Negative for acute joint swelling Skin: Negative for color change and wound.  Neurological: Negative for tremors and numbness other than noted  Psychiatric/Behavioral: Negative for decreased concentration or  hyperactivity.       Objective:   Physical Exam BP 126/88  Pulse 118  Temp(Src) 97.7 F (36.5 C) (Oral)  Wt 138 lb (62.596 kg)  SpO2 97% VS noted, + orthostatic on challenge today Constitutional: Pt appears well-developed and well-nourished.  HENT: Head: NCAT.  Right Ear: External ear normal.  Left Ear: External ear normal.  Eyes: Conjunctivae and EOM are normal. Pupils are equal, round, and reactive to light.  Neck: Normal range of motion. Neck supple.  Cardiovascular: Normal rate and regular rhythm.   Pulmonary/Chest: Effort normal and breath sounds normal.  Abd:  Soft, NT, non-distended, + BS Neurological: Pt is alert. Not confused  Skin: Skin is warm. No erythema.  Psychiatric: Pt behavior is normal. Thought content normal.     Assessment & Plan:

## 2013-02-26 NOTE — Assessment & Plan Note (Signed)
To change the diovan hct to avapro 300 qd, avoid diuretic for now

## 2013-02-26 NOTE — Assessment & Plan Note (Signed)
?   PD releated vs volume vs fever vs other; pt has not taken any of the toprol XL 25 as she was told to only take if HR > 130; ok to hold at this point; will ask for holter monitor, has f/u with Dr Ross jan 23 per pt

## 2013-02-26 NOTE — Assessment & Plan Note (Addendum)
?   Volume related vs PD, to d/c diovan HCT, encourage fluids, consider midodrine but little to no symptoms and no recent falls  Note:  Total time for pt hx, exam, review of record with pt in the room, determination of diagnoses and plan for further eval and tx is > 40 min, with over 50% spent in coordination and counseling of patient

## 2013-02-26 NOTE — Patient Instructions (Signed)
Please stop the diovan HCT  Please drink more fluids in the next few days  We did not have benicar samples, so please start a prescription for the avapro 300 mg per day (you have 2 scripts today, so one for mailin if needed)  No further labs or tests needed, except: You will be contacted regarding the referral for: holter monitor  Please keep your appointments with your specialists as you have planned - Dr Harrington Challenger on jan 23 as you have planned  Please call with any worsening pain, fever, dizziness, falls or other new symptoms

## 2013-02-26 NOTE — Progress Notes (Signed)
Pre-visit discussion using our clinic review tool. No additional management support is needed unless otherwise documented below in the visit note.  

## 2013-02-26 NOTE — Assessment & Plan Note (Signed)
Recent exam neg and ua, cxr neg , wbc normal, afeb today - ? Related to postop status,  to f/u any worsening symptoms or concerns

## 2013-03-04 ENCOUNTER — Telehealth: Payer: Self-pay

## 2013-03-04 DIAGNOSIS — R000 Tachycardia, unspecified: Secondary | ICD-10-CM

## 2013-03-04 NOTE — Telephone Encounter (Signed)
The patient called and is hoping to get a referral for a cardiologist   Callback - [phone]

## 2013-03-04 NOTE — Telephone Encounter (Signed)
This has been done.

## 2013-03-10 ENCOUNTER — Ambulatory Visit (INDEPENDENT_AMBULATORY_CARE_PROVIDER_SITE_OTHER): Payer: Managed Care, Other (non HMO)

## 2013-03-10 ENCOUNTER — Ambulatory Visit (INDEPENDENT_AMBULATORY_CARE_PROVIDER_SITE_OTHER): Payer: Managed Care, Other (non HMO) | Admitting: Podiatry

## 2013-03-10 ENCOUNTER — Encounter: Payer: Managed Care, Other (non HMO) | Admitting: Podiatry

## 2013-03-10 VITALS — BP 145/95 | HR 96 | Resp 16 | Ht <= 58 in | Wt 130.0 lb

## 2013-03-10 DIAGNOSIS — Z9889 Other specified postprocedural states: Secondary | ICD-10-CM

## 2013-03-10 DIAGNOSIS — M2010 Hallux valgus (acquired), unspecified foot: Secondary | ICD-10-CM

## 2013-03-10 NOTE — Progress Notes (Signed)
Subjective:     Patient ID: Destiny Roth, female   DOB: 07-Jun-1950, 63 y.o.   MRN: [id number]  HPI patient states I'm doing great with my foot surgery on my left foot and I would like to get my right foot fixed in March. My tachycardia cardiac is getting better and has been checked by physicians at this time   Review of Systems     Objective:   Physical Exam Neurovascular status intact with negative Homans sign noted and well-healed surgical site first metatarsal left with good range of motion and good structural alignment    Assessment:     Doing well post surgery left and wants surgery on the right foot bunion mid-March    Plan:     Dispensed anklet and Darco shoe with gradual increase in activity and return to saw shoe gear in the next 1-2 weeks. Scheduled right foot and we'll discuss in greater detail at next visit

## 2013-03-10 NOTE — Progress Notes (Signed)
Pt states she's doing well and would like to set up surgery date for the left in middle March.  Pt has a trip in June 2015.

## 2013-03-16 NOTE — Progress Notes (Signed)
1) Austin bunionectomy left foot 

## 2013-03-18 ENCOUNTER — Encounter (INDEPENDENT_AMBULATORY_CARE_PROVIDER_SITE_OTHER): Payer: Managed Care, Other (non HMO)

## 2013-03-18 ENCOUNTER — Encounter: Payer: Self-pay | Admitting: *Deleted

## 2013-03-18 DIAGNOSIS — R000 Tachycardia, unspecified: Secondary | ICD-10-CM

## 2013-03-18 NOTE — Progress Notes (Signed)
Patient ID: Destiny Roth, female   DOB: 07-17-1950, 63 y.o.   MRN: [id number] E-Cardio 24 hour holter monitor applied.

## 2013-03-22 ENCOUNTER — Ambulatory Visit (INDEPENDENT_AMBULATORY_CARE_PROVIDER_SITE_OTHER): Payer: Managed Care, Other (non HMO) | Admitting: Internal Medicine

## 2013-03-22 ENCOUNTER — Encounter: Payer: Self-pay | Admitting: Internal Medicine

## 2013-03-22 VITALS — BP 147/91 | HR 99 | Ht <= 58 in | Wt 141.0 lb

## 2013-03-22 DIAGNOSIS — R0602 Shortness of breath: Secondary | ICD-10-CM

## 2013-03-22 DIAGNOSIS — R5383 Other fatigue: Secondary | ICD-10-CM

## 2013-03-22 DIAGNOSIS — I10 Essential (primary) hypertension: Secondary | ICD-10-CM

## 2013-03-22 DIAGNOSIS — R5381 Other malaise: Secondary | ICD-10-CM

## 2013-03-22 NOTE — Patient Instructions (Signed)
Your physician recommends that you continue on your current medications as directed. Please refer to the Current Medication list given to you today.  Your physician has requested that you have an echocardiogram. Echocardiography is a painless test that uses sound waves to create images of your heart. It provides your doctor with information about the size and shape of your heart and how well your heart's chambers and valves are working. This procedure takes approximately one hour. There are no restrictions for this procedure.  Increase your sodium and fluid intake  Your physician recommends that you schedule a follow-up appointment in: 1 month with Dr.Ross

## 2013-03-22 NOTE — Progress Notes (Signed)
HPI Patient is a 63 yo who is referred for evaluation of tachycardia. The patient says around 2011 after she finished her masters, she became more tired  Exhausted easily.   Feels heart racing.  Dizzy, trembly, hasnt felt right for a long time.   No Known Allergies  Current Outpatient Prescriptions  Medication Sig Dispense Refill  . aspirin 81 MG tablet Take 81 mg by mouth daily.        Marland Kitchen atorvastatin (LIPITOR) 20 MG tablet Take 1 tablet (20 mg total) by mouth daily.  90 tablet  3  . cycloSPORINE (RESTASIS) 0.05 % ophthalmic emulsion 1 drop 2 (two) times daily.      Marland Kitchen diltiazem (CARDIZEM CD) 300 MG 24 hr capsule Take 1 capsule (300 mg total) by mouth daily.  90 capsule  3  . DULoxetine (CYMBALTA) 60 MG capsule Take 1 capsule (60 mg total) by mouth daily.  90 capsule  3  . HYDROcodone-acetaminophen (NORCO/VICODIN) 5-325 MG per tablet every 6 (six) hours as needed.       . irbesartan (AVAPRO) 300 MG tablet Take 1 tablet (300 mg total) by mouth daily.  90 tablet  3  . NON FORMULARY ZZZYQUIL AS NEEDED      . potassium chloride (KLOR-CON 10) 10 MEQ tablet Take 1 tablet (10 mEq total) by mouth daily.  90 tablet  3  . Pramipexole Dihydrochloride (MIRAPEX ER) 2.25 MG TB24 Take 1 tablet by mouth.       No current facility-administered medications for this visit.    Past Medical History  Diagnosis Date  . GLUCOSE INTOLERANCE 05/24/2008  . HYPERLIPIDEMIA 09/23/2006  . ANXIETY 09/23/2006  . DEPRESSION 09/23/2006  . CARPAL TUNNEL SYNDROME, BILATERAL 09/23/2006  . HYPERTENSION 09/23/2006  . GERD 05/24/2008  . DIVERTICULOSIS, COLON 09/23/2006  . IBS 09/23/2006  . BUNION, RIGHT FOOT 09/18/2009  . OSTEOPOROSIS 05/24/2008  . Abdominal pain, epigastric 09/18/2009  . Impaired glucose tolerance 11/04/2010  . Anosmia 11/06/2010  . Parkinson's disease     Past Surgical History  Procedure Laterality Date  . Tubal ligation    . Nasal septum repair    . S/p ganglion cyst right wrist    . S/p bilat cts    . S/p  right thumb trigger finger surgury    . S/p fibroid ablation    . Esophagogastroduodenoscopy  2008    Dr. Harriette Bouillon duodenal ulcer  . Colonoscopy w/ biopsies  2008    Dr. Collene Mares -negative random bxs  . Cataract surgery    . Cataract    . Cararact      Family History  Problem Relation Age of Onset  . Peripheral vascular disease Mother     with bilat amputations  . Heart attack Mother   . Hypertension Father   . Heart disease Father 52    History   Social History  . Marital Status: Married    Spouse Name: N/A    Number of Children: 2  . Years of Education: N/A   Occupational History  . Not on file.   Social History Main Topics  . Smoking status: Never Smoker   . Smokeless tobacco: Never Used  . Alcohol Use: Yes     Comment: Occ  . Drug Use: No  . Sexual Activity: Yes    Partners: Male    Birth Control/ Protection: Post-menopausal   Other Topics Concern  . Not on file   Social History Narrative  . No narrative on file  Review of Systems:  All systems reviewed.  They are negative to the above problem except as previously stated.  Vital Signs: BP 147/91  Pulse 99  Ht 4\' 10"  (1.473 m)  Wt 141 lb (63.957 kg)  BMI 29.48 kg/m2 BP  Lying:  130/80  P 89   Sitting:  140/90  P95;    Standing 0:  134/78  P98;  2 min 148/74  P 107  5 min:  158/94  P105 Physical Exam Patient is in NAD   HEENT:  Normocephalic, atraumatic. EOMI, PERRLA.  Neck: JVP is normal.  No bruits.  Lungs: clear to auscultation. No rales no wheezes.  Heart: Regular rate and rhythm. Normal S1, S2. No S3.   No significant murmurs. PMI not displaced.  Abdomen:  Supple, nontender. Normal bowel sounds. No masses. No hepatomegaly.  Extremities:   Good distal pulses throughout. No lower extremity edema.  Musculoskeletal :moving all extremities.  Neuro:   alert and oriented x3.  CN II-XII grossly intact.  EKG ST 101 bpm   Assessment and Plan:  1.  Tachycardia  Patient wore holter monitor Average HR  was 89 bpm  SR to ST  No arrhythmia. With stading in clinic her HR and BP did go up  Review of recent labs shows that her urine was very concentrated  She admits to not drinking much during the day I would recomm 1.  Echo to evaluate LV function  2.  She drink more fluids and salt.   WIll follow up in a few wks

## 2013-03-30 ENCOUNTER — Encounter: Payer: Self-pay | Admitting: Pulmonary Disease

## 2013-03-30 ENCOUNTER — Ambulatory Visit (INDEPENDENT_AMBULATORY_CARE_PROVIDER_SITE_OTHER): Payer: Managed Care, Other (non HMO) | Admitting: Pulmonary Disease

## 2013-03-30 VITALS — BP 134/86 | HR 107 | Temp 97.3°F | Ht <= 58 in | Wt 142.4 lb

## 2013-03-30 DIAGNOSIS — G4733 Obstructive sleep apnea (adult) (pediatric): Secondary | ICD-10-CM

## 2013-03-30 NOTE — Progress Notes (Signed)
   Subjective:    Patient ID: Destiny Roth, female    DOB: 1950-10-13, 63 y.o.   MRN: [id number]  HPI 63 year old woman presents for evaluation of obstructive sleep apnea due to symptoms of night snoring, breathing impaired per husband, and daytime somnolence most days. Husband has witnessed apneas especially on her back  She was diagnosed with Parkinson's 2013 and has been maintained on Mirapex with the dose increased in the last 3 months. Leg jerks have improved after increasing dose of Mirapex. She has a history of recent vivid dreams and screaming and talking during sleep.  PSG showed mild OSA - AHI 12/h- OV to discuss options further ? CPAP No evidence of increased muscle tone during REM sleep apnea -hypopnea index of 12 events per hour, REM AHI was 36/h. There were 58 RERAs with an RDI of 23 events per hour  Underwent foot surgery & rt shoulder injection Tachycardia being evaluated by cards - echo pending  Review of Systems neg for any significant sore throat, dysphagia, itching, sneezing, nasal congestion or excess/ purulent secretions, fever, chills, sweats, unintended wt loss, pleuritic or exertional cp, hempoptysis, orthopnea pnd or change in chronic leg swelling. Also denies presyncope, palpitations, heartburn, abdominal pain, nausea, vomiting, diarrhea or change in bowel or urinary habits, dysuria,hematuria, rash, arthralgias, visual complaints, headache, numbness weakness or ataxia.     Objective:   Physical Exam  Gen. Pleasant, obese, in no distress ENT - no lesions, no post nasal drip Neck: No JVD, no thyromegaly, no carotid bruits Lungs: no use of accessory muscles, no dullness to percussion, decreased without rales or rhonchi  Cardiovascular: Rhythm regular, heart sounds  normal, no murmurs or gallops, no peripheral edema Musculoskeletal: No deformities, no cyanosis or clubbing , no tremors       Assessment & Plan:

## 2013-03-30 NOTE — Patient Instructions (Signed)
You have mild obstructive sleep apnea  Lets reassess in 6 months re: trial of CPAP

## 2013-03-31 NOTE — Assessment & Plan Note (Signed)
We discussed implications of mild OSA Reasons  To treat would be fatigue, somnolence does not seem to be  A major issue We discussed cardiovascular benefits. She prefers to defer for now - reassess in 6 months We also discussed oral appliance but would likely try cpap first if symptoms worsen

## 2013-04-01 ENCOUNTER — Ambulatory Visit: Payer: Managed Care, Other (non HMO) | Admitting: Podiatry

## 2013-04-01 ENCOUNTER — Ambulatory Visit (INDEPENDENT_AMBULATORY_CARE_PROVIDER_SITE_OTHER): Payer: Managed Care, Other (non HMO) | Admitting: Podiatry

## 2013-04-01 ENCOUNTER — Encounter: Payer: Self-pay | Admitting: Podiatry

## 2013-04-01 ENCOUNTER — Ambulatory Visit (INDEPENDENT_AMBULATORY_CARE_PROVIDER_SITE_OTHER): Payer: Managed Care, Other (non HMO)

## 2013-04-01 DIAGNOSIS — R609 Edema, unspecified: Secondary | ICD-10-CM

## 2013-04-01 DIAGNOSIS — M2010 Hallux valgus (acquired), unspecified foot: Secondary | ICD-10-CM

## 2013-04-01 DIAGNOSIS — Z9889 Other specified postprocedural states: Secondary | ICD-10-CM

## 2013-04-02 NOTE — Progress Notes (Signed)
Subjective:     Patient ID: Destiny Roth, female   DOB: Jun 04, 1950, 63 y.o.   MRN: [id number]  HPI patient presents stating my left foot is doing well and I am ready to get my right foot fixed. States she still has some swelling in the left foot but is able to walk normally with minimal discomfort   Review of Systems     Objective:   Physical Exam Neurovascular status intact with negative Homans sign noted and well-healing surgical site first metatarsal left with good range of motion and no crepitus within the joint. Hyperostosis with discomfort around the first metatarsal head right that is red when pressed against the metatarsal head    Assessment:     Well-healing surgical site left was structural bunion deformity of the right foot noted    Plan:     X-ray reviewed of left foot and today we discussed correction of the right foot allowing patient to read consent form and after reviewing consent form all complications and procedure consisting of Austin bunionectomy schedule the patient for Liane Comber bunionectomy to be performed in the next several weeks. Patient understands recovery from surgery such as this is approximately 6 months and there is no long-term guarantees. Patient may return to normal shoe gear on the left foot and gradually increase activity levels

## 2013-04-09 ENCOUNTER — Ambulatory Visit (HOSPITAL_COMMUNITY): Payer: Managed Care, Other (non HMO) | Attending: Internal Medicine | Admitting: Cardiology

## 2013-04-09 ENCOUNTER — Ambulatory Visit (INDEPENDENT_AMBULATORY_CARE_PROVIDER_SITE_OTHER): Payer: Managed Care, Other (non HMO) | Admitting: Ophthalmology

## 2013-04-09 DIAGNOSIS — H35379 Puckering of macula, unspecified eye: Secondary | ICD-10-CM

## 2013-04-09 DIAGNOSIS — H35039 Hypertensive retinopathy, unspecified eye: Secondary | ICD-10-CM

## 2013-04-09 DIAGNOSIS — H3530 Unspecified macular degeneration: Secondary | ICD-10-CM

## 2013-04-09 DIAGNOSIS — I10 Essential (primary) hypertension: Secondary | ICD-10-CM

## 2013-04-09 DIAGNOSIS — H43819 Vitreous degeneration, unspecified eye: Secondary | ICD-10-CM

## 2013-04-09 DIAGNOSIS — R5381 Other malaise: Secondary | ICD-10-CM | POA: Insufficient documentation

## 2013-04-09 DIAGNOSIS — R0602 Shortness of breath: Secondary | ICD-10-CM | POA: Insufficient documentation

## 2013-04-09 DIAGNOSIS — R5383 Other fatigue: Secondary | ICD-10-CM

## 2013-04-09 NOTE — Progress Notes (Signed)
Echo performed. 

## 2013-04-13 ENCOUNTER — Encounter: Payer: Self-pay | Admitting: Podiatry

## 2013-04-13 DIAGNOSIS — M2010 Hallux valgus (acquired), unspecified foot: Secondary | ICD-10-CM

## 2013-04-19 ENCOUNTER — Encounter: Payer: Self-pay | Admitting: Podiatry

## 2013-04-19 ENCOUNTER — Ambulatory Visit (INDEPENDENT_AMBULATORY_CARE_PROVIDER_SITE_OTHER): Payer: Managed Care, Other (non HMO)

## 2013-04-19 ENCOUNTER — Ambulatory Visit (INDEPENDENT_AMBULATORY_CARE_PROVIDER_SITE_OTHER): Payer: Managed Care, Other (non HMO) | Admitting: Podiatry

## 2013-04-19 VITALS — BP 130/80 | HR 99 | Resp 19 | Ht <= 58 in | Wt 130.0 lb

## 2013-04-19 DIAGNOSIS — Z9889 Other specified postprocedural states: Secondary | ICD-10-CM

## 2013-04-19 DIAGNOSIS — M2010 Hallux valgus (acquired), unspecified foot: Secondary | ICD-10-CM

## 2013-04-19 DIAGNOSIS — R609 Edema, unspecified: Secondary | ICD-10-CM

## 2013-04-19 NOTE — Progress Notes (Signed)
   Subjective:    Patient ID: Destiny Roth, female    DOB: 1950/12/19, 63 y.o.   MRN: [id number] Pt presents for POV1 of right foot.  Pt's right foot is slightly swollen, no redness, and slightly painful. HPI    Review of Systems     Objective:   Physical Exam        Assessment & Plan:

## 2013-04-20 NOTE — Progress Notes (Signed)
Subjective:     Patient ID: Destiny Roth, female   DOB: 1951-01-22, 63 y.o.   MRN: [id number]  HPI patient presents stating I'm doing okay with my right foot but it does seem like it is swelling more. She admits she walked on it tonight of surgery without her boot from one room to another and the foot was still note that she did bear full weight on her foot   Review of Systems     Objective:   Physical Exam Neurovascular status intact negative Homans sign noted and mild to moderate edema the forefoot right with a wound edges well coapted in range of motion good with 25 of dorsiflexion 20 of plantarflexion    Assessment:     Healing well from foot surgery right with possibility of trauma secondary to activity    Plan:     X-rays reviewed and discussed this been some lifting of the first metatarsal head but I do think it'll be stable at this position and we'll not require to be re fixated. I will have to watch it carefully and I advised that she needs to stay completely immobilize for the next several weeks. Reappoint in the next 2-3 weeks for reevaluation

## 2013-04-21 ENCOUNTER — Ambulatory Visit (INDEPENDENT_AMBULATORY_CARE_PROVIDER_SITE_OTHER): Payer: 59 | Admitting: Ophthalmology

## 2013-04-23 ENCOUNTER — Ambulatory Visit (INDEPENDENT_AMBULATORY_CARE_PROVIDER_SITE_OTHER): Payer: Managed Care, Other (non HMO) | Admitting: Internal Medicine

## 2013-04-23 ENCOUNTER — Encounter: Payer: Self-pay | Admitting: Internal Medicine

## 2013-04-23 VITALS — BP 144/82 | HR 104 | Ht <= 58 in | Wt 130.0 lb

## 2013-04-23 DIAGNOSIS — I10 Essential (primary) hypertension: Secondary | ICD-10-CM

## 2013-04-23 DIAGNOSIS — R002 Palpitations: Secondary | ICD-10-CM

## 2013-04-23 MED ORDER — DILTIAZEM HCL ER COATED BEADS 120 MG PO CP24: 120.0000 mg | ORAL_CAPSULE | Freq: Every day | ORAL | Status: DC

## 2013-04-23 MED ORDER — PINDOLOL 5 MG PO TABS: 5.0000 mg | ORAL_TABLET | Freq: Two times a day (BID) | ORAL | Status: DC

## 2013-04-23 NOTE — Progress Notes (Signed)
HPI Patient is a 63 yo with a history of HTN who I saw remotely and then about 1 month ago  She was referred this year for tachycardia. Holter showed SR/ST with average HR of 90   When I saw her I recomm that she increase fluid/salt since SG on UA was high She has done this some   Still says HR can be high at times  Also gets dizzy with bending   Echo was normal   Going to [place] in [place] Concerned about heart since dad had MI in late 40sNo Known Allergies  Current Outpatient Prescriptions  Medication Sig Dispense Refill  . atorvastatin (LIPITOR) 20 MG tablet Take 1 tablet (20 mg total) by mouth daily.  90 tablet  3  . diltiazem (CARDIZEM CD) 300 MG 24 hr capsule Take 1 capsule (300 mg total) by mouth daily.  90 capsule  3  . DULoxetine (CYMBALTA) 60 MG capsule Take 1 capsule (60 mg total) by mouth daily.  90 capsule  3  . HYDROcodone-acetaminophen (NORCO/VICODIN) 5-325 MG per tablet every 6 (six) hours as needed.       . irbesartan (AVAPRO) 300 MG tablet Take 1 tablet (300 mg total) by mouth daily.  90 tablet  3  . NON FORMULARY ZZZYQUIL AS NEEDED      . Pramipexole Dihydrochloride (MIRAPEX ER) 2.25 MG TB24 Take 1 tablet by mouth.       No current facility-administered medications for this visit.    Past Medical History  Diagnosis Date  . GLUCOSE INTOLERANCE 05/24/2008  . HYPERLIPIDEMIA 09/23/2006  . ANXIETY 09/23/2006  . DEPRESSION 09/23/2006  . CARPAL TUNNEL SYNDROME, BILATERAL 09/23/2006  . HYPERTENSION 09/23/2006  . GERD 05/24/2008  . DIVERTICULOSIS, COLON 09/23/2006  . IBS 09/23/2006  . BUNION, RIGHT FOOT 09/18/2009  . OSTEOPOROSIS 05/24/2008  . Abdominal pain, epigastric 09/18/2009  . Impaired glucose tolerance 11/04/2010  . Anosmia 11/06/2010  . Parkinson's disease     Past Surgical History  Procedure Laterality Date  . Tubal ligation    . Nasal septum repair    . S/p ganglion cyst right wrist    . S/p bilat cts    . S/p right thumb trigger finger surgury    . S/p fibroid  ablation    . Esophagogastroduodenoscopy  2008    Dr. Harriette Bouillon duodenal ulcer  . Colonoscopy w/ biopsies  2008    Dr. Collene Mares -negative random bxs  . Cataract surgery    . Cataract    . Cararact      Family History  Problem Relation Age of Onset  . Peripheral vascular disease Mother     with bilat amputations  . Heart attack Mother   . Hypertension Father   . Heart disease Father 1    History   Social History  . Marital Status: Married    Spouse Name: N/A    Number of Children: 2  . Years of Education: N/A   Occupational History  . Not on file.   Social History Main Topics  . Smoking status: Never Smoker   . Smokeless tobacco: Never Used  . Alcohol Use: Yes     Comment: Occ  . Drug Use: No  . Sexual Activity: Yes    Partners: Male    Birth Control/ Protection: Post-menopausal   Other Topics Concern  . Not on file   Social History Narrative  . No narrative on file    Review of Systems:  All systems reviewed.  They are negative to the above problem except as previously stated.  Vital Signs: BP 144/82  Pulse 104  Ht 4\' 9"  (1.448 m)  Wt 130 lb (58.968 kg)  BMI 28.12 kg/m2 BP  Lying:  130/80  P 89   Sitting:  140/90  P95;    Standing 0:  134/78  P98;  2 min 148/74  P 107  5 min:  158/94  P105 Physical Exam Patient is in NAD   HEENT:  Normocephalic, atraumatic. EOMI, PERRLA.  Neck: JVP is normal.  No bruits.  Lungs: clear to auscultation. No rales no wheezes.  Heart: Regular rate and rhythm. Normal S1, S2. No S3.   No significant murmurs. PMI not displaced.  Abdomen:  Supple, nontender. Normal bowel sounds. No masses. No hepatomegaly.  Extremities:   Good distal pulses throughout. No lower extremity edema.  Musculoskeletal :moving all extremities.  Neuro:   alert and oriented x3.  CN II-XII grossly intact.  Assessment and Plan:  1.  Tachycardia  I have not seen an arrhythmia.  Holter shows avg HR is normal.  I do think she has some blood/pressure  dysregulaton.  I don't think she drinks enough fluids  Recomm  Will switch meds a bit  Decrase dilt to 120  Add pindolol 5 bid  Continue Avapro 300 Keep drinking fluids   F/U in 3 wks.    2.  parknson's  Has appt with Dr Carles Collet in April  I am not convinced above related to this but will ask Dr Doristine Devoid opinion  3.  HL  Keep on lipitor.

## 2013-04-23 NOTE — Progress Notes (Deleted)
HPI Patient is a 63 yo who is referred for evaluation of tachycardia. The patient says around 2011 after she finished her masters, she became more tired  Exhausted easily.   Feels heart racing.  Dizzy, trembly, hasnt felt right for a long time.   No Known Allergies  Current Outpatient Prescriptions  Medication Sig Dispense Refill  . aspirin 81 MG tablet Take 81 mg by mouth daily.        Marland Kitchen atorvastatin (LIPITOR) 20 MG tablet Take 1 tablet (20 mg total) by mouth daily.  90 tablet  3  . cycloSPORINE (RESTASIS) 0.05 % ophthalmic emulsion 1 drop 2 (two) times daily.      Marland Kitchen diltiazem (CARDIZEM CD) 300 MG 24 hr capsule Take 1 capsule (300 mg total) by mouth daily.  90 capsule  3  . DULoxetine (CYMBALTA) 60 MG capsule Take 1 capsule (60 mg total) by mouth daily.  90 capsule  3  . HYDROcodone-acetaminophen (NORCO/VICODIN) 5-325 MG per tablet every 6 (six) hours as needed.       . irbesartan (AVAPRO) 300 MG tablet Take 1 tablet (300 mg total) by mouth daily.  90 tablet  3  . NON FORMULARY ZZZYQUIL AS NEEDED      . Pramipexole Dihydrochloride (MIRAPEX ER) 2.25 MG TB24 Take 1 tablet by mouth.       No current facility-administered medications for this visit.    Past Medical History  Diagnosis Date  . GLUCOSE INTOLERANCE 05/24/2008  . HYPERLIPIDEMIA 09/23/2006  . ANXIETY 09/23/2006  . DEPRESSION 09/23/2006  . CARPAL TUNNEL SYNDROME, BILATERAL 09/23/2006  . HYPERTENSION 09/23/2006  . GERD 05/24/2008  . DIVERTICULOSIS, COLON 09/23/2006  . IBS 09/23/2006  . BUNION, RIGHT FOOT 09/18/2009  . OSTEOPOROSIS 05/24/2008  . Abdominal pain, epigastric 09/18/2009  . Impaired glucose tolerance 11/04/2010  . Anosmia 11/06/2010  . Parkinson's disease     Past Surgical History  Procedure Laterality Date  . Tubal ligation    . Nasal septum repair    . S/p ganglion cyst right wrist    . S/p bilat cts    . S/p right thumb trigger finger surgury    . S/p fibroid ablation    . Esophagogastroduodenoscopy  2008    Dr.  Harriette Bouillon duodenal ulcer  . Colonoscopy w/ biopsies  2008    Dr. Collene Mares -negative random bxs  . Cataract surgery    . Cataract    . Cararact      Family History  Problem Relation Age of Onset  . Peripheral vascular disease Mother     with bilat amputations  . Heart attack Mother   . Hypertension Father   . Heart disease Father 57    History   Social History  . Marital Status: Married    Spouse Name: N/A    Number of Children: 2  . Years of Education: N/A   Occupational History  . Not on file.   Social History Main Topics  . Smoking status: Never Smoker   . Smokeless tobacco: Never Used  . Alcohol Use: Yes     Comment: Occ  . Drug Use: No  . Sexual Activity: Yes    Partners: Male    Birth Control/ Protection: Post-menopausal   Other Topics Concern  . Not on file   Social History Narrative  . No narrative on file    Review of Systems:  All systems reviewed.  They are negative to the above problem except as previously stated.  Vital Signs:  There were no vitals taken for this visit. BP  Lying:  130/80  P 89   Sitting:  140/90  P95;    Standing 0:  134/78  P98;  2 min 148/74  P 107  5 min:  158/94  P105 Physical Exam Patient is in NAD   HEENT:  Normocephalic, atraumatic. EOMI, PERRLA.  Neck: JVP is normal.  No bruits.  Lungs: clear to auscultation. No rales no wheezes.  Heart: Regular rate and rhythm. Normal S1, S2. No S3.   No significant murmurs. PMI not displaced.  Abdomen:  Supple, nontender. Normal bowel sounds. No masses. No hepatomegaly.  Extremities:   Good distal pulses throughout. No lower extremity edema.  Musculoskeletal :moving all extremities.  Neuro:   alert and oriented x3.  CN II-XII grossly intact.  EKG ST 101 bpm   Assessment and Plan:  1.  Tachycardia  Patient wore holter monitor Average HR was 89 bpm  SR to ST  No arrhythmia. With stading in clinic her HR and BP did go up  Review of recent labs shows that her urine was very concentrated   She admits to not drinking much during the day I would recomm 1.  Echo to evaluate LV function  2.  She drink more fluids and salt.   WIll follow up in a few wks

## 2013-04-23 NOTE — Patient Instructions (Signed)
Your physician recommends that you schedule a follow-up appointment in: 3 WEEKS WITH DR ROSS  STOP DILTIAZEM 300 MG  START DILTIAZEM 120 MG ONCE DAILY  START PINDOLOL 5 MG ONE TABLET TWICE DAILY

## 2013-05-04 NOTE — Progress Notes (Signed)
Dr Paulla Dolly ordered Demerol 50mg  #35 one or two tablet every 4 to 6 hours prn pain, and Phenergan 25mg  #35 one or two every 4 to 6 hours with Demerol.

## 2013-05-10 ENCOUNTER — Ambulatory Visit: Payer: Managed Care, Other (non HMO) | Admitting: Internal Medicine

## 2013-05-10 ENCOUNTER — Encounter: Payer: Managed Care, Other (non HMO) | Admitting: Podiatry

## 2013-05-13 ENCOUNTER — Ambulatory Visit (INDEPENDENT_AMBULATORY_CARE_PROVIDER_SITE_OTHER): Payer: Managed Care, Other (non HMO)

## 2013-05-13 ENCOUNTER — Encounter: Payer: Self-pay | Admitting: Podiatry

## 2013-05-13 ENCOUNTER — Ambulatory Visit (INDEPENDENT_AMBULATORY_CARE_PROVIDER_SITE_OTHER): Payer: Managed Care, Other (non HMO) | Admitting: Podiatry

## 2013-05-13 VITALS — BP 155/87 | HR 90 | Resp 16

## 2013-05-13 DIAGNOSIS — M2010 Hallux valgus (acquired), unspecified foot: Secondary | ICD-10-CM

## 2013-05-14 NOTE — Progress Notes (Signed)
Subjective:     Patient ID: Destiny Roth, female   DOB: 1950/05/29, 63 y.o.   MRN: [id number]  HPI patient states my right foot is doing much better and I did have surgery on my right shoulder   Review of Systems     Objective:   Physical Exam Neurovascular status intact negative Homans sign noted with mild edema around the first metatarsal right with good range of motion with approximate 30 of dorsiflexion 25 plantar flexion noted    Assessment:     Healing well from foot surgery right with having had a small amount of movement in the first metatarsal which appears to clinically be doing well    Plan:     Reviewed x-rays and H&P and advised on continued range of motion exercises and gradual increase in shoe gear usage over the next 4 weeks. Reappoint in 4 weeks to recheck earlier if any issues should occur

## 2013-05-18 NOTE — Progress Notes (Signed)
Dr Paulla Dolly performed - Right Destiny Roth.

## 2013-06-04 ENCOUNTER — Ambulatory Visit: Payer: Managed Care, Other (non HMO) | Admitting: Internal Medicine

## 2013-06-07 ENCOUNTER — Encounter: Payer: Self-pay | Admitting: Neurology

## 2013-06-07 ENCOUNTER — Ambulatory Visit (INDEPENDENT_AMBULATORY_CARE_PROVIDER_SITE_OTHER): Payer: Managed Care, Other (non HMO) | Admitting: Neurology

## 2013-06-07 VITALS — BP 132/90 | HR 100 | Resp 18 | Ht <= 58 in | Wt 141.0 lb

## 2013-06-07 DIAGNOSIS — F329 Major depressive disorder, single episode, unspecified: Secondary | ICD-10-CM

## 2013-06-07 DIAGNOSIS — G20 Parkinson's disease: Secondary | ICD-10-CM

## 2013-06-07 DIAGNOSIS — G20A1 Parkinson's disease without dyskinesia, without mention of fluctuations: Secondary | ICD-10-CM

## 2013-06-07 DIAGNOSIS — F3289 Other specified depressive episodes: Secondary | ICD-10-CM

## 2013-06-07 MED ORDER — PRAMIPEXOLE DIHYDROCHLORIDE ER 3 MG PO TB24: 1.0000 | ORAL_TABLET | Freq: Every day | ORAL | Status: DC

## 2013-06-07 MED ORDER — PRAMIPEXOLE DIHYDROCHLORIDE 0.75 MG PO TABS: 0.7500 mg | ORAL_TABLET | Freq: Two times a day (BID) | ORAL | Status: DC

## 2013-06-07 NOTE — Progress Notes (Signed)
Subjective:   Destiny Roth was seen in f/u for PD diagnosed 10/2011.  The patient is a 63 y.o. right handed female with a history of tremor.Onset of symptoms was at end of 2012.   She has noted a gradual loss of smell and taste over the last 20 years.  She even had a rhinoplasty and septoplasty in 1993 to see if that would help and it did not.  She can smell almost nothing now.    She is on Mirapex ER 1.5 mg daily.  The patient states that the tremor is virtually gone, with the exception of when she is stressed at work and then she will notice tremor in both legs.  Her walking is much better.  She feels more stable.  .  She does have some dizziness in the early morning and is unsure if that is related to Mirapex, her blood pressure medication or potassium.  No compulsive behaviors have been noted with the Mirapex.  There have been no sleep attacks.  She has not yet started exercising.  She is going to be changing jobs which is stressful for her.  10/12/12 update:  She has had some tremor internally and in the legs.  She notices some word finding trouble.   She has been under increased stress.  Her husband had back surgery 2 weeks ago and she has been the primary caregiver.  In addition, she is between jobs and needs to find a new job.  She would like to see if she can get off her blood pressure medication.  02/08/13 update:  Last visit, I increased the patient's Mirapex to a total of 2.25 mg daily.  The patient states that it definitely helped the tremor.  She almost never sees tremor any more.  She is, however, complaining about weight gain.  Her weight has been stable for many months, but when we looked back she gained weight between her May, 2014 visit and her September, 2014 visit.  She is frustrated that she cannot get that off.  In addition, she complains of some low energy and vaginal dryness.  I did look at her prior medical records since last visit.  She was complaining to her primary care  physician about dizziness and her blood pressure medication was decreased.  She did see Dr. Elsworth Soho since last visit and a split-night sleep study was recommended.  She states that she is scheduled to have foot surgery for a bunion on February 17 2013.  She had a procedure this morning to "break up a calcification" in the right shoulder.  06/07/13 update:  Pt is f/u today.  The records that were made available to me were reviewed since last visit.  Pt had surgery on both feet on separate dates for bunyionectomies.  Had tachycardia after the first and ended up having a holter monitor but HR was normal on the monitor.  Diltiazem was decreased, pindolol was added.  She had a PSG done since last visit and had an AHI of 12.   No CPAP has been recommended at this point.  She also had a R shoulder surgery on April 7 but those records are not available to me.  She does c/o if bending over, she will have lightheadedness but separate from that she has had 2 episodes of vertigo (room spinning).  Only tremor in the R hand if she has to hold out the arm that she has surgery on and then she experiences pain, so she is  not sure if the tremor is related to the pain or the surgery.  She is planning to go to [place] in about 5 weeks and is worried that she will not feel better by then.  Overall, since the arm surgery and feet surgery, she just does not feel back to herself.  She has not been able to exercise.  Current/Previously tried tremor medications: Klonopin  Current medications that may exacerbate tremor:  ?Cymbalta.  I reviewed the MRI of the brain done at the end of 2013 with her.  This revealed some mild small vessel disease, likely secondary to hypertension.  Otherwise, it was unremarkable.  The small vessel disease was not within the basal ganglia.  I showed her the films in the office today.   No Known Allergies  Current Outpatient Prescriptions on File Prior to Visit  Medication Sig Dispense Refill  .  atorvastatin (LIPITOR) 20 MG tablet Take 1 tablet (20 mg total) by mouth daily.  90 tablet  3  . diltiazem (CARDIZEM CD) 120 MG 24 hr capsule Take 1 capsule (120 mg total) by mouth daily.  90 capsule  3  . DULoxetine (CYMBALTA) 60 MG capsule Take 1 capsule (60 mg total) by mouth daily.  90 capsule  3  . irbesartan (AVAPRO) 300 MG tablet Take 1 tablet (300 mg total) by mouth daily.  90 tablet  3  . pindolol (VISKEN) 5 MG tablet Take 1 tablet (5 mg total) by mouth 2 (two) times daily.  60 tablet  12  . Pramipexole Dihydrochloride (MIRAPEX ER) 2.25 MG TB24 Take 1 tablet by mouth.       No current facility-administered medications on file prior to visit.    Past Medical History  Diagnosis Date  . GLUCOSE INTOLERANCE 05/24/2008  . HYPERLIPIDEMIA 09/23/2006  . ANXIETY 09/23/2006  . DEPRESSION 09/23/2006  . CARPAL TUNNEL SYNDROME, BILATERAL 09/23/2006  . HYPERTENSION 09/23/2006  . GERD 05/24/2008  . DIVERTICULOSIS, COLON 09/23/2006  . IBS 09/23/2006  . BUNION, RIGHT FOOT 09/18/2009  . OSTEOPOROSIS 05/24/2008  . Abdominal pain, epigastric 09/18/2009  . Impaired glucose tolerance 11/04/2010  . Anosmia 11/06/2010  . Parkinson's disease     Past Surgical History  Procedure Laterality Date  . Tubal ligation    . Nasal septum repair    . S/p ganglion cyst right wrist    . S/p bilat cts    . S/p right thumb trigger finger surgury    . S/p fibroid ablation    . Esophagogastroduodenoscopy  2008    Dr. Harriette Bouillon duodenal ulcer  . Colonoscopy w/ biopsies  2008    Dr. Collene Mares -negative random bxs  . Cataract extraction    . Shoulder surgery Right 2015  . Foot surgery Bilateral 2015    History   Social History  . Marital Status: Married    Spouse Name: N/A    Number of Children: 2  . Years of Education: N/A   Occupational History  . Not on file.   Social History Main Topics  . Smoking status: Never Smoker   . Smokeless tobacco: Never Used  . Alcohol Use: Yes     Comment: Occ  . Drug Use: No   . Sexual Activity: Yes    Partners: Male    Birth Control/ Protection: Post-menopausal   Other Topics Concern  . Not on file   Social History Narrative  . No narrative on file    Family Status  Relation Status Death Age  . Father  Deceased 51    MI, hypertension since teens  . Mother Deceased     2001-05-08, PVD, [place], [place]  . Brother Alive     3, HTN  . Child Alive     2, alive and well  . Maternal Grandmother Deceased     PD    Review of Systems Intermittent diarrhea and constipation with IBS.  A complete 10 system ROS was obtained and was negative apart from what is mentioned.   Objective:   VITALS:   Filed Vitals:   06/07/13 1459  BP: 132/90  Pulse: 100  Resp: 18  Height: 4\' 10"  (1.473 m)  Weight: 141 lb (63.957 kg)   Wt Readings from Last 3 Encounters:  06/07/13 141 lb (63.957 kg)  04/23/13 130 lb (58.968 kg)  04/19/13 130 lb (58.968 kg)    GEN:  The patient appears stated age (actually younger) and is in NAD. HEENT:  Normocephalic, atraumatic.  The mucous membranes are moist. The superficial temporal arteries are without ropiness or tenderness. CV:  RRR Lungs:  CTAB Neck/HEME:  There are no carotid bruits bilaterally.  Neurological examination:  Orientation: The patient is alert and oriented x3. Fund of knowledge is appropriate.  Recent and remote memory are intact.  Attention and concentration are normal.    Able to name objects and repeat phrases. Cranial nerves: There is good facial symmetry.   Extraocular muscles are intact.  There are no square wave jerks. The speech is fluent and clear. Soft palate rises symmetrically and there is no tongue deviation. Hearing is intact to conversational tone. Sensation: Sensation is intact to light to light touch throughout. Motor: Strength is 5/5 in the bilateral upper and lower extremities.   Shoulder shrug is equal and symmetric.  There is no pronator drift. Deep tendon reflexes: Deep tendon reflexes are 3+/4 at  the bilateral biceps, triceps, brachioradialis, patella and achilles. Plantar responses are downgoing bilaterally.  Movement examination: Tone: There is mild increased tone bilaterally. Abnormal movements: There was no tremor today even with distraction procedures. Coordination:  There is normal RAM's today Gait and Station:    Her gait had a fairly normal stride length today.  Arm swing was good.  She is mildly unstable.  LABS:  Lab Results  Component Value Date   WBC 5.3 02/24/2013   HGB 14.6 02/24/2013   HCT 43.8 02/24/2013   MCV 90.9 02/24/2013   PLT 372.0 02/24/2013     Chemistry      Component Value Date/Time   NA 136 02/24/2013 1623   K 3.6 02/24/2013 1623   CL 101 02/24/2013 1623   CO2 26 02/24/2013 1623   BUN 22 02/24/2013 1623   CREATININE 1.0 02/24/2013 1623      Component Value Date/Time   CALCIUM 9.8 02/24/2013 1623   ALKPHOS 69 02/24/2013 1623   AST 23 02/24/2013 1623   ALT 27 02/24/2013 1623   BILITOT 1.0 02/24/2013 1623     Lab Results  Component Value Date   VITAMINB12 443 11/26/2011   Lab Results  Component Value Date   FOLATE 13.5 11/26/2011   Lab Results  Component Value Date   TSH 1.08 02/24/2013        Assessment/Plan:   1.  Akinetic rigid Parkinson's disease.  -I am going to increase her Mirapex to 3.0 mg daily. Risks, benefits, side effects and alternative therapies were discussed.  The opportunity to ask questions was given and they were answered to the best of my  ability.  The patient expressed understanding and willingness to follow the outlined treatment protocols.  -I. think she would benefit from some physical and occupational therapy.  She is a little bit leery because she is getting ready to go to [place].  If the increase in Mirapex does not help before she goes to [place], I asked her call me.  If it does help, we can always pursue therapy when she gets back.  -I do think that the multiple surgeries have set her PD back a little, which is common  with PD. 2.  HTN  -her BP med has been lowered because of dizziness.  I do not think that she has significant autonomic instability associated with Parkinson's, at least at this point. 3.  Depression  -This is common in patients with Parkinson's disease and she should remain on the Cymbalta, which is working well for her. 4.  No Follow-up on file.

## 2013-06-07 NOTE — Patient Instructions (Signed)
1. A prescription for Mirapex .75 mg has been sent to your pharmacy. Take this with your 2.25 mg tablets until complete and then switch to 3 mg tablets.  2. Let us know if you would like a referral for PT/OT at neuro rehab after your vacation. 3. Follow up 4 months.

## 2013-06-11 ENCOUNTER — Telehealth: Payer: Self-pay | Admitting: Neurology

## 2013-06-11 NOTE — Telephone Encounter (Signed)
QUESTIONS ABOUT MEDICATION SHE IS ON.

## 2013-06-11 NOTE — Telephone Encounter (Signed)
Patient aware. She will let us know.

## 2013-06-11 NOTE — Telephone Encounter (Signed)
My intention was to give her the ER but that was my fault but it should be okay until the 3 month one is here.  As for the [facility] question, yes it will likely be cheaper to change but if we change, then it will be a 3 time per day drug.  She can let us know what she would like to do.

## 2013-06-11 NOTE — Telephone Encounter (Signed)
Spoke with patient. She wants to know if you think the 3 times a day would be as effective as the extended release if she changes her medication for medicare purposes and what dose she would be on (1 mg TID)? Please advise.

## 2013-06-11 NOTE — Telephone Encounter (Signed)
I think it would be just as effective and yes, it would be 1 mg tid before meals

## 2013-06-11 NOTE — Telephone Encounter (Signed)
When I sent the Mirapex into the pharmacy for Mirapex .75 mg I did not send ER, just the mirapex .75 and she wants to make sure that is okay. She got sick on the first night of taking it, but feels better now. Her other question is, she is 63 yo in August and switching to Medicare part D and generic Mirapex is covered, whereas the Mirapex is either not covered or a tier 4, she is wondering if she should switch. Please advise?

## 2013-06-14 ENCOUNTER — Encounter: Payer: Managed Care, Other (non HMO) | Admitting: Podiatry

## 2013-06-16 ENCOUNTER — Ambulatory Visit (INDEPENDENT_AMBULATORY_CARE_PROVIDER_SITE_OTHER): Payer: Managed Care, Other (non HMO)

## 2013-06-16 ENCOUNTER — Ambulatory Visit (INDEPENDENT_AMBULATORY_CARE_PROVIDER_SITE_OTHER): Payer: Managed Care, Other (non HMO) | Admitting: Podiatry

## 2013-06-16 DIAGNOSIS — Z9889 Other specified postprocedural states: Secondary | ICD-10-CM

## 2013-06-16 DIAGNOSIS — M779 Enthesopathy, unspecified: Secondary | ICD-10-CM

## 2013-06-16 MED ORDER — TRIAMCINOLONE ACETONIDE 10 MG/ML IJ SUSP
10.0000 mg | Freq: Once | INTRAMUSCULAR | Status: AC
  Administered 2013-06-16: 10 mg

## 2013-06-16 NOTE — Progress Notes (Signed)
Subjective:     Patient ID: [name], female   DOB: 1950/10/30, 63 y.o.   MRN: [id number]  HPI patient presents stating that she still getting some swelling and discomfort around her first metatarsal head right and she is leaving for [place] in 3 weeks and is worried about this   Review of Systems     Objective:   Physical Exam Neurovascular status intact with first MPJ which does have mild swelling right both dorsal and plantar with good range of motion 30 dorsiflexion 30 plantarflexion with no crepitus within the joint    Assessment:     Patient had unfortunate fracture of the first metatarsal head which is healed but is left with inflammation secondary to the process    Plan:     Reviewed condition and x-ray him today injected the lateral side of the first MPJ 3 mg Kenalog 5 mg Xylocaine and discussed the possibility for injecting the plantar surface if symptoms warrant when I see her in 3 weeks prior to going to [place]. We may also get a small injection to the left if it is bothering her at that time

## 2013-06-16 NOTE — Progress Notes (Signed)
   Subjective:    Patient ID: Destiny Roth, female    DOB: January 09, 1951, 63 y.o.   MRN: [id number]  HPI  Pain in right foot, post op 2 months, pain is on the bottom of foot and top great toe, increases with walking.  Review of Systems     Objective:   Physical Exam        Assessment & Plan:

## 2013-07-07 ENCOUNTER — Ambulatory Visit (INDEPENDENT_AMBULATORY_CARE_PROVIDER_SITE_OTHER): Payer: Managed Care, Other (non HMO) | Admitting: Podiatry

## 2013-07-07 ENCOUNTER — Ambulatory Visit (INDEPENDENT_AMBULATORY_CARE_PROVIDER_SITE_OTHER): Payer: Managed Care, Other (non HMO)

## 2013-07-07 ENCOUNTER — Encounter: Payer: Self-pay | Admitting: Podiatry

## 2013-07-07 VITALS — BP 160/89 | HR 91 | Resp 16

## 2013-07-07 DIAGNOSIS — M779 Enthesopathy, unspecified: Secondary | ICD-10-CM

## 2013-07-07 DIAGNOSIS — M2010 Hallux valgus (acquired), unspecified foot: Secondary | ICD-10-CM

## 2013-07-07 MED ORDER — TRIAMCINOLONE ACETONIDE 10 MG/ML IJ SUSP
10.0000 mg | Freq: Once | INTRAMUSCULAR | Status: AC
  Administered 2013-07-07: 10 mg

## 2013-07-07 NOTE — Progress Notes (Signed)
Subjective:     Patient ID: Destiny Roth, female   DOB: 08-Sep-1950, 63 y.o.   MRN: [id number]  HPI patient presents stating that I am doing well with a little bit placed on the right foot getting ready for my trip and I wanted to have this foot looks   Review of Systems     Objective:   Physical Exam Neurovascular status intact with well-healing surgical site first metatarsal both feet and good range of motion 35 bilateral dorsiflexion 30 plantar flexion with no crepitus in the joint noted with mild discomfort upon palpation to the lateral side first metatarsal    Assessment:     Continued capsulitis right first MPJ lateral side    Plan:     Injection 3 mg Kenalog 5 of Xylocaine and advised on gradual increase in activity levels reappoint 3 months

## 2013-07-08 NOTE — Progress Notes (Signed)
HPI Destiny Roth is a 63 yo with a history of HTN and  tachycardia. Holter showed SR/ST with average HR of 90   When I saw her I recomm that she increase fluid/salt since SG on UA was high I saw her in May. Today she was seen by K supple  Had injection to shoulder.  Given pain meds While waiting in room to be seen this AM became very warm.  Tried to get off of exam table.  Fell.  Hit shoulder.  Denies syncope.   sNo Known Allergies  Current Outpatient Prescriptions  Medication Sig Dispense Refill  . aspirin 81 MG tablet Take 81 mg by mouth daily.      Marland Kitchen atorvastatin (LIPITOR) 20 MG tablet Take 1 tablet (20 mg total) by mouth daily.  90 tablet  3  . diltiazem (CARDIZEM CD) 120 MG 24 hr capsule Take 1 capsule (120 mg total) by mouth daily.  90 capsule  3  . DULoxetine (CYMBALTA) 60 MG capsule Take 1 capsule (60 mg total) by mouth daily.  90 capsule  3  . irbesartan (AVAPRO) 300 MG tablet Take 1 tablet (300 mg total) by mouth daily.  90 tablet  3  . naproxen (NAPROSYN) 500 MG tablet       . pindolol (VISKEN) 5 MG tablet Take 1 tablet (5 mg total) by mouth 2 (two) times daily.  60 tablet  12  . Pramipexole Dihydrochloride (MIRAPEX ER) 3 MG TB24 Take 1 tablet (3 mg total) by mouth daily.  90 tablet  3   No current facility-administered medications for this visit.    Past Medical History  Diagnosis Date  . GLUCOSE INTOLERANCE 05/24/2008  . HYPERLIPIDEMIA 09/23/2006  . ANXIETY 09/23/2006  . DEPRESSION 09/23/2006  . CARPAL TUNNEL SYNDROME, BILATERAL 09/23/2006  . HYPERTENSION 09/23/2006  . GERD 05/24/2008  . DIVERTICULOSIS, COLON 09/23/2006  . IBS 09/23/2006  . BUNION, RIGHT FOOT 09/18/2009  . OSTEOPOROSIS 05/24/2008  . Abdominal pain, epigastric 09/18/2009  . Impaired glucose tolerance 11/04/2010  . Anosmia 11/06/2010  . Parkinson's disease     Past Surgical History  Procedure Laterality Date  . Tubal ligation    . Nasal septum repair    . S/p ganglion cyst right wrist    . S/p bilat cts    .  S/p right thumb trigger finger surgury    . S/p fibroid ablation    . Esophagogastroduodenoscopy  2008    Dr. Harriette Bouillon duodenal ulcer  . Colonoscopy w/ biopsies  2008    Dr. Collene Mares -negative random bxs  . Cataract extraction    . Shoulder surgery Right 2015  . Foot surgery Bilateral 2015    Family History  Problem Relation Age of Onset  . Peripheral vascular disease Mother     with bilat amputations  . Heart attack Mother   . Hypertension Father   . Heart disease Father 22    History   Social History  . Marital Status: Married    Spouse Name: N/A    Number of Children: 2  . Years of Education: N/A   Occupational History  . Not on file.   Social History Main Topics  . Smoking status: Never Smoker   . Smokeless tobacco: Never Used  . Alcohol Use: Yes     Comment: Occ  . Drug Use: No  . Sexual Activity: Yes    Partners: Male    Birth Control/ Protection: Post-menopausal   Other Topics Concern  . Not on  file   Social History Narrative  . No narrative on file    Review of Systems:  All systems reviewed.  They are negative to the above problem except as previously stated.  Vital Signs: BP 156/90  Pulse 98  Ht 4\' 10"  (1.473 m)  Wt 142 lb (64.411 kg)  BMI 29.69 kg/m2  Physical Exam Destiny Roth is in NAD   HEENT:  Normocephalic, atraumatic. EOMI, PERRLA.  Neck: JVP is normal.  No bruits.  Lungs: clear to auscultation. No rales no wheezes.  Heart: Regular rate and rhythm. Normal S1, S2. No S3.   No significant murmurs. PMI not displaced.  Abdomen:  Supple, nontender. Normal bowel sounds. No masses. No hepatomegaly.  Extremities:   R shoulder very tender.. No lower extremity edema.  Musculoskeletal :moving all extremities.  Neuro:   alert and oriented x3.  CN II-XII grossly intact.  Assessment and Plan:  1.  Tachycardia  No documented arrhythmia.  Encouraged her in past stay hydrated.  Keep on surrent regimen.  2. Fall  Destiny Roth with pain meds today  Has not  eatten much  No syncope. Would have Destiny Roth go back to ortho for xray . 3.  HTN  Follow   2.  parkinson's   3.  HL  Keep on lipitor.    Destiny Roth going to [place] Sunday  WIll follow up later this summer

## 2013-07-09 ENCOUNTER — Ambulatory Visit (INDEPENDENT_AMBULATORY_CARE_PROVIDER_SITE_OTHER): Payer: Managed Care, Other (non HMO) | Admitting: Internal Medicine

## 2013-07-09 ENCOUNTER — Encounter: Payer: Self-pay | Admitting: Internal Medicine

## 2013-07-09 VITALS — BP 156/90 | HR 98 | Ht <= 58 in | Wt 142.0 lb

## 2013-07-09 DIAGNOSIS — I10 Essential (primary) hypertension: Secondary | ICD-10-CM

## 2013-07-09 DIAGNOSIS — R002 Palpitations: Secondary | ICD-10-CM

## 2013-07-09 DIAGNOSIS — E785 Hyperlipidemia, unspecified: Secondary | ICD-10-CM

## 2013-07-09 NOTE — Patient Instructions (Signed)
Your physician recommends that you schedule a follow-up appointment: End of July/Beginning of August  Your physician recommends that you continue on your current medications as directed. Please refer to the Current Medication list given to you today.

## 2013-08-03 ENCOUNTER — Other Ambulatory Visit: Payer: Self-pay | Admitting: Internal Medicine

## 2013-08-04 ENCOUNTER — Encounter: Payer: Self-pay | Admitting: Physician Assistant

## 2013-08-04 ENCOUNTER — Ambulatory Visit (INDEPENDENT_AMBULATORY_CARE_PROVIDER_SITE_OTHER): Payer: Managed Care, Other (non HMO) | Admitting: Physician Assistant

## 2013-08-04 VITALS — BP 152/92 | HR 84 | Ht <= 58 in | Wt 142.0 lb

## 2013-08-04 DIAGNOSIS — R413 Other amnesia: Secondary | ICD-10-CM

## 2013-08-04 DIAGNOSIS — R000 Tachycardia, unspecified: Secondary | ICD-10-CM

## 2013-08-04 DIAGNOSIS — R079 Chest pain, unspecified: Secondary | ICD-10-CM | POA: Insufficient documentation

## 2013-08-04 DIAGNOSIS — I10 Essential (primary) hypertension: Secondary | ICD-10-CM

## 2013-08-04 MED ORDER — HYDROCHLOROTHIAZIDE 25 MG PO TABS: 25.0000 mg | ORAL_TABLET | Freq: Every day | ORAL | Status: DC

## 2013-08-04 NOTE — Progress Notes (Signed)
HPI: This is a 63 year old female patient of Dr. Harrington Challenger who she falls for hypertension and tachycardia. 2-D echo in March showed normal LV function EF 60-65%.   A few months back her diltiazem was decreased from 300 220 mg daily and pindolol 5 mg twice a day was added for better heart rate control. Since then she says her blood pressure has been climbing. She just got back from a 2 week to her through your. She did a lot of walking about 5-6 miles per day and noticed her feet began to swell again. She also has noticed some chest heaviness at rest or with exertion on a few occasions. She says it lasts about a half an hour and eases spontaneously. Her chest pain does not radiate and she has no associated shortness of breath, palpitations, dizziness or presyncope. She does complain of shortness of breath with exertion. She has not been exercising regularly because of recent foot surgery and shoulder surgery. Her daughter who is a nurse feels like her memory is getting worse. She would like carotid Dopplers done.  She did have a coronary calcification noted in the distribution of the LAD on CT scan of her abdomen in October 2014.  No Known Allergies  Current Outpatient Prescriptions on File Prior to Visit: aspirin 81 MG tablet, Take 81 mg by mouth daily., Disp: , Rfl:  atorvastatin (LIPITOR) 20 MG tablet, Take 1 tablet (20 mg total) by mouth daily., Disp: 90 tablet, Rfl: 3 diltiazem (CARDIZEM CD) 120 MG 24 hr capsule, Take 1 capsule (120 mg total) by mouth daily., Disp: 90 capsule, Rfl: 3 DULoxetine (CYMBALTA) 60 MG capsule, Take 1 capsule (60 mg total) by mouth daily., Disp: 90 capsule, Rfl: 3 irbesartan (AVAPRO) 300 MG tablet, Take 1 tablet (300 mg total) by mouth daily., Disp: 90 tablet, Rfl: 3 naproxen (NAPROSYN) 500 MG tablet, , Disp: , Rfl:  pindolol (VISKEN) 5 MG tablet, Take 1 tablet (5 mg total) by mouth 2 (two) times daily., Disp: 60 tablet, Rfl: 12 Pramipexole Dihydrochloride (MIRAPEX ER) 3 MG  TB24, Take 1 tablet (3 mg total) by mouth daily., Disp: 90 tablet, Rfl: 3  No current facility-administered medications on file prior to visit.   Past Medical History:   GLUCOSE INTOLERANCE                             05/24/2008    HYPERLIPIDEMIA                                  09/23/2006    ANXIETY                                         09/23/2006    DEPRESSION                                      09/23/2006    CARPAL TUNNEL SYNDROME, BILATERAL               09/23/2006    HYPERTENSION  09/23/2006    GERD                                            05/24/2008    DIVERTICULOSIS, COLON                           09/23/2006    IBS                                             09/23/2006    BUNION, RIGHT FOOT                              09/18/2009    OSTEOPOROSIS                                    05/24/2008    Abdominal pain, epigastric                      09/18/2009    Impaired glucose tolerance                      11/04/2010    Anosmia                                         11/06/2010    Parkinson's disease                                         Past Surgical History:   TUBAL LIGATION                                                nasal septum repair                                           s/p ganglion cyst right wrist                                 s/p bilat CTS                                                 s/p right thumb trigger finger surgury                        s/p fibroid ablation  ESOPHAGOGASTRODUODENOSCOPY                       2008           Comment:Dr. Mann-small duodenal ulcer   COLONOSCOPY W/ BIOPSIES                          2008           Comment:Dr. Collene Mares -negative random bxs   CATARACT EXTRACTION                                           SHOULDER SURGERY                                Right 2015         FOOT SURGERY                                    Bilateral 2015        Review of patient's  family history indicates:   Peripheral vascular disease    Mother                     Comment: with bilat amputations   Heart attack                   Mother                   Hypertension                   Father                   Heart disease                  Father                   Social History   Marital Status: Married             Spouse Name:                      Years of Education:                 Number of children: 2           Occupational History   None on file  Social History Main Topics   Smoking Status: Never Smoker                     Smokeless Status: Never Used                       Alcohol Use: Yes               Comment: Occ   Drug Use: No             Sexual Activity: Yes               Partners with: Female      Birth Control/Protection: Post-menopausal  Other Topics            Concern   None on file  Social History Narrative   None on file    ROS: Parkinson's disease and having trouble finding the right words to speak, fatigue, postmenopausal symptoms otherwise see history of present illness   PHYSICAL EXAM: Well-nournished, in no acute distress. Neck: No JVD, HJR, Bruit, or thyroid enlargement  Lungs: No tachypnea, clear without wheezing, rales, or rhonchi  Cardiovascular: RRR, PMI not displaced, positive S4, 2/6 systolic murmur at the left sternal border, no bruit, thrill, or heave.  Abdomen: BS normal. Soft without organomegaly, masses, lesions or tenderness.  Extremities: without cyanosis, clubbing or edema. Good distal pulses bilateral  SKin: Warm, no lesions or rashes   Musculoskeletal: No deformities  Neuro: no focal signs  BP 152/92  Pulse 84  Ht 4\' 10"  (1.473 m)  Wt 142 lb (64.411 kg)  BMI 29.69 kg/m2    EKG: Normal sinus rhythm, normal EKG  2Decho 04/09/13: Study Conclusions  - Left ventricle: The cavity size was normal. Systolic   function was normal. The estimated ejection fraction was   in the range of 60% to 65%. Wall  motion was normal; there   were no regional wall motion abnormalities. Doppler   parameters are consistent with abnormal left ventricular   relaxation (grade 1 diastolic dysfunction). There was no   evidence of elevated ventricular filling pressure by   Doppler parameters. - Aortic valve: Sclerosis without stenosis. No   regurgitation. - Aortic root: The aortic root was normal in size. - Mitral valve: No regurgitation. - Left atrium: The atrium was normal in size. - Right ventricle: The cavity size was normal. Wall   thickness was normal. Systolic function was normal. - Tricuspid valve: Trivial regurgitation. - Pulmonary arteries: Systolic pressure was within the   normal range. - Pericardium, extracardiac: There was no pericardial   effusion. - Impressions: Overall it was a very poor quality study, but   there is normal chanber size and biventricular function.   No significant valvular abnormality. Impressions:  - Overall it was a very poor quality study, but there is   normal chanber size and biventricular function. No   significant valvular abnormality.

## 2013-08-04 NOTE — Assessment & Plan Note (Signed)
Patient has recently had a few episodes of chest heaviness. She does have multiple cardiac risk factors including a father who died at 16 of an MI. She also had some coronary calcification in the distribution of the LAD on CT scan in October 2014. Because of this we will order a stress Myoview.

## 2013-08-04 NOTE — Assessment & Plan Note (Signed)
Tachycardias has been controlled on pindolol and diltiazem.

## 2013-08-04 NOTE — Assessment & Plan Note (Signed)
Recent trouble with memory impairment. Check carotid Dopplers.

## 2013-08-04 NOTE — Assessment & Plan Note (Addendum)
Blood pressure has been elevated. I will add HCTZ 25 mg once daily. 2 g sodium diet. Check bmet in 2 weeks Follow up with Dr. Harrington Challenger in one month.

## 2013-08-04 NOTE — Patient Instructions (Addendum)
Your physician has requested that you have en exercise stress myoview. Please follow instruction sheet, as given.  Your physician has requested that you have a carotid DOPPLER. This test is an ultrasound of the carotid arteries in your neck. It looks at blood flow through these arteries that supply the brain with blood. Allow one hour for this exam. There are no restrictions or special instructions.  Your physician recommends that you schedule a follow-up appointment in: WITH DR. ROSS IN 1 MONTH  Your physician has recommended you make the following change in your medication:   START HCTZ (HYDROCHLOROTHIZIDE) 25 MG ONCE A DAY

## 2013-08-07 ENCOUNTER — Other Ambulatory Visit: Payer: Self-pay | Admitting: Neurology

## 2013-08-12 ENCOUNTER — Encounter: Payer: Self-pay | Admitting: Cardiology

## 2013-08-12 ENCOUNTER — Other Ambulatory Visit: Payer: Self-pay | Admitting: Neurology

## 2013-08-12 MED ORDER — PRAMIPEXOLE DIHYDROCHLORIDE ER 0.75 MG PO TB24: 1.0000 | ORAL_TABLET | Freq: Two times a day (BID) | ORAL | Status: DC

## 2013-08-12 NOTE — Telephone Encounter (Signed)
Mirapex .75mg  refill requested. Per last office note- patient to remain on medication but to be on the ER tablets. ER tablets approved and sent to patient's pharmacy.

## 2013-08-12 NOTE — Telephone Encounter (Signed)
Pt has called about Mirapex refill. Wants generic and had a question about dosage. Please call [phone] / Sherri S.

## 2013-08-23 ENCOUNTER — Telehealth: Payer: Self-pay | Admitting: *Deleted

## 2013-08-23 DIAGNOSIS — R079 Chest pain, unspecified: Secondary | ICD-10-CM

## 2013-08-23 NOTE — Telephone Encounter (Signed)
Cherene Altes, RN Cc: Armando Gang            Good morning,   Patient is scheduled for a myoview tomorrow.   Per Ermalinda Barrios can you please put in order for GXT and call her with instructions?   Insurance is denying myoview since she can walk on a treadmill.    Thank you!   Caryl Pina         Per above staff message, cancelled appointment for [facility] for 7/28. Pt informed, is expecting a call today to schedule GXT. Will come tomorrow for labs and carotid. Order placed for GXT, discussed with [facility]

## 2013-08-24 ENCOUNTER — Ambulatory Visit (HOSPITAL_COMMUNITY): Payer: Managed Care, Other (non HMO) | Attending: Internal Medicine | Admitting: Cardiology

## 2013-08-24 ENCOUNTER — Other Ambulatory Visit (INDEPENDENT_AMBULATORY_CARE_PROVIDER_SITE_OTHER): Payer: Managed Care, Other (non HMO)

## 2013-08-24 ENCOUNTER — Encounter (HOSPITAL_COMMUNITY): Payer: Managed Care, Other (non HMO)

## 2013-08-24 DIAGNOSIS — I10 Essential (primary) hypertension: Secondary | ICD-10-CM

## 2013-08-24 DIAGNOSIS — R000 Tachycardia, unspecified: Secondary | ICD-10-CM

## 2013-08-24 DIAGNOSIS — E785 Hyperlipidemia, unspecified: Secondary | ICD-10-CM | POA: Insufficient documentation

## 2013-08-24 DIAGNOSIS — R209 Unspecified disturbances of skin sensation: Secondary | ICD-10-CM

## 2013-08-24 DIAGNOSIS — R413 Other amnesia: Secondary | ICD-10-CM

## 2013-08-24 DIAGNOSIS — R4701 Aphasia: Secondary | ICD-10-CM

## 2013-08-24 DIAGNOSIS — R404 Transient alteration of awareness: Secondary | ICD-10-CM | POA: Insufficient documentation

## 2013-08-24 DIAGNOSIS — R1310 Dysphagia, unspecified: Secondary | ICD-10-CM | POA: Insufficient documentation

## 2013-08-24 DIAGNOSIS — R42 Dizziness and giddiness: Secondary | ICD-10-CM | POA: Insufficient documentation

## 2013-08-24 LAB — BASIC METABOLIC PANEL
BUN: 20 mg/dL (ref 6–23)
CHLORIDE: 99 meq/L (ref 96–112)
CO2: 29 mEq/L (ref 19–32)
Calcium: 9.6 mg/dL (ref 8.4–10.5)
Creatinine, Ser: 0.9 mg/dL (ref 0.4–1.2)
GFR: 65.96 mL/min (ref >60.00)
Glucose, Bld: 127 mg/dL — ABNORMAL HIGH (ref 70–99)
POTASSIUM: 3.1 meq/L — AB (ref 3.5–5.1)
Sodium: 136 mEq/L (ref 135–145)

## 2013-08-24 NOTE — Progress Notes (Signed)
Carotid duplex performed 

## 2013-08-25 ENCOUNTER — Telehealth: Payer: Self-pay | Admitting: *Deleted

## 2013-08-25 DIAGNOSIS — E876 Hypokalemia: Secondary | ICD-10-CM

## 2013-08-25 DIAGNOSIS — Z79899 Other long term (current) drug therapy: Secondary | ICD-10-CM

## 2013-08-25 NOTE — Telephone Encounter (Signed)
Advised patient of lab results and K+. Patient has 10 meq at home and will take 2 twice a day for 2 days and increase K+ rich foods. Scheduled follow up lab appointment

## 2013-08-25 NOTE — Telephone Encounter (Signed)
Message copied by Earvin Hansen on Wed Aug 25, 2013 10:19 AM ------      Message from: Imogene Burn      Created: Wed Aug 25, 2013  9:48 AM       Potassium low. Kdur 20 meq twice today and tomorrow. Increase diet with potassium rich foods. Recheck in 2 weeks. ------

## 2013-08-31 ENCOUNTER — Other Ambulatory Visit: Payer: Self-pay | Admitting: Internal Medicine

## 2013-09-06 NOTE — Telephone Encounter (Signed)
New message      Patient calling to discuss medication.

## 2013-09-06 NOTE — Telephone Encounter (Signed)
Patient called regarding her Pindolol being so expensive since now on Medicare. Insurance recommending changing to Metoprolol or Atenolol. Will forward to Dr Harrington Challenger for review

## 2013-09-07 ENCOUNTER — Other Ambulatory Visit (INDEPENDENT_AMBULATORY_CARE_PROVIDER_SITE_OTHER): Payer: Medicare Other

## 2013-09-07 ENCOUNTER — Telehealth: Payer: Self-pay | Admitting: *Deleted

## 2013-09-07 DIAGNOSIS — E876 Hypokalemia: Secondary | ICD-10-CM

## 2013-09-07 DIAGNOSIS — Z79899 Other long term (current) drug therapy: Secondary | ICD-10-CM

## 2013-09-07 LAB — BASIC METABOLIC PANEL
BUN: 24 mg/dL — ABNORMAL HIGH (ref 6–23)
CO2: 22 mEq/L (ref 19–32)
Calcium: 9.4 mg/dL (ref 8.4–10.5)
Chloride: 98 mEq/L (ref 96–112)
Creatinine, Ser: 1.1 mg/dL (ref 0.4–1.2)
GFR: 55.91 mL/min — ABNORMAL LOW (ref >60.00)
GLUCOSE: 96 mg/dL (ref 70–99)
POTASSIUM: 3.5 meq/L (ref 3.5–5.1)
Sodium: 136 mEq/L (ref 135–145)

## 2013-09-07 MED ORDER — METOPROLOL TARTRATE 50 MG PO TABS: 50.0000 mg | ORAL_TABLET | Freq: Two times a day (BID) | ORAL | Status: DC

## 2013-09-07 NOTE — Telephone Encounter (Signed)
Patient at office today after having labs drawn. She would like to know what other medication she can take since pindolol is not covered on her medicare plan. They recommend metoprolol or atenolol instead.  Pt reports has taken atenolol in past without adverse reaction. Only thing new is that now she takes mirapex for parkinson's.  Advised her will send message to Dr. Harrington Challenger for recommendations and then get back to her. She has about a week of pindolol left.

## 2013-09-07 NOTE — Telephone Encounter (Signed)
Order placed for metoprolol. Called patient to inform. Verbalizes understanding and appreciation.

## 2013-09-07 NOTE — Telephone Encounter (Signed)
OK  Try 50 mg metoprolol bid

## 2013-09-13 DIAGNOSIS — M25819 Other specified joint disorders, unspecified shoulder: Secondary | ICD-10-CM | POA: Diagnosis not present

## 2013-09-16 ENCOUNTER — Telehealth (HOSPITAL_COMMUNITY): Payer: Self-pay

## 2013-09-16 NOTE — Telephone Encounter (Signed)
Encounter complete. 

## 2013-09-21 ENCOUNTER — Ambulatory Visit (HOSPITAL_COMMUNITY)
Admission: RE | Admit: 2013-09-21 | Discharge: 2013-09-21 | Disposition: A | Discharge status: Home / Self Care | Payer: Medicare Other | Source: Ambulatory Visit | Attending: Cardiology | Admitting: Cardiology

## 2013-09-21 DIAGNOSIS — R079 Chest pain, unspecified: Secondary | ICD-10-CM

## 2013-09-21 NOTE — Procedures (Signed)
Exercise Treadmill Test   Test  Exercise Tolerance Test Ordering MD: Cathlean Cower, MD  Interpreting MD:   Unique Test No: 1  Treadmill:  1  Indication for ETT: chest pain - rule out ischemia  Contraindication to ETT: No   Stress Modality: exercise - treadmill  Cardiac Imaging Performed: non   Protocol: standard Bruce - maximal  Max BP:  179/84  Max MPHR (bpm):  156 85% MPR (bpm):  133  MPHR obtained (bpm):  139 % MPHR obtained:  89  Reached 85% MPHR (min:sec):  4:55 Total Exercise Time (min-sec):  6:01  Workload in METS:  7.00 Borg Scale:   Reason ETT Terminated:  dyspnea    ST Segment Analysis At Rest: Sinus tachycardia; nonspecific ST changes. With Exercise: borderline ST changes  Other Information Arrhythmia:  No Angina during ETT:  absent (0) Quality of ETT:  indeterminate  ETT Interpretation:  borderline (indeterminate) with non-specific ST changes  Comments: ETT with fair exercise tolerance; no chest pain but patient did complain of dyspnea; normal BP response; ECG with increased ST depression in the inferior leads with exertion difficult to interpret due to baseline ECG changes; suggest stress echocardiogram or nuclear study to further assess.  Kirk Ruths

## 2013-09-22 ENCOUNTER — Other Ambulatory Visit: Payer: Self-pay | Admitting: Internal Medicine

## 2013-09-22 ENCOUNTER — Other Ambulatory Visit: Payer: Self-pay | Admitting: Physician Assistant

## 2013-09-22 DIAGNOSIS — R000 Tachycardia, unspecified: Secondary | ICD-10-CM

## 2013-09-24 ENCOUNTER — Ambulatory Visit: Payer: Managed Care, Other (non HMO) | Admitting: Internal Medicine

## 2013-09-27 ENCOUNTER — Encounter (HOSPITAL_COMMUNITY): Payer: Medicare Other

## 2013-10-01 ENCOUNTER — Ambulatory Visit (HOSPITAL_COMMUNITY): Payer: Medicare Other | Attending: Physician Assistant | Admitting: Radiology

## 2013-10-01 VITALS — BP 126/77 | HR 90 | Ht <= 58 in | Wt 145.0 lb

## 2013-10-01 DIAGNOSIS — R079 Chest pain, unspecified: Secondary | ICD-10-CM | POA: Diagnosis not present

## 2013-10-01 DIAGNOSIS — R0989 Other specified symptoms and signs involving the circulatory and respiratory systems: Secondary | ICD-10-CM | POA: Insufficient documentation

## 2013-10-01 DIAGNOSIS — R0609 Other forms of dyspnea: Secondary | ICD-10-CM | POA: Insufficient documentation

## 2013-10-01 DIAGNOSIS — I10 Essential (primary) hypertension: Secondary | ICD-10-CM | POA: Diagnosis not present

## 2013-10-01 DIAGNOSIS — R000 Tachycardia, unspecified: Secondary | ICD-10-CM | POA: Insufficient documentation

## 2013-10-01 DIAGNOSIS — R0602 Shortness of breath: Secondary | ICD-10-CM

## 2013-10-01 MED ORDER — TECHNETIUM TC 99M SESTAMIBI GENERIC - CARDIOLITE
11.0000 | Freq: Once | INTRAVENOUS | Status: AC | PRN
  Administered 2013-10-01: 11 via INTRAVENOUS

## 2013-10-01 MED ORDER — TECHNETIUM TC 99M SESTAMIBI GENERIC - CARDIOLITE
33.0000 | Freq: Once | INTRAVENOUS | Status: AC | PRN
  Administered 2013-10-01: 33 via INTRAVENOUS

## 2013-10-01 NOTE — Progress Notes (Signed)
[facility] [place], [place] [address] [phone]    Cardiology Nuclear Med Study  Destiny Roth is a 63 y.o. female     MRN : [id number]     DOB: 20-Sep-1950  Procedure Date: 10/01/2013  Nuclear Med Background Indication for Stress Test:  Evaluation for Ischemia and Abnormal GXT History:  No Cardiac History Cardiac Risk Factors: Hypertension  Symptoms:  Chest Pain and SOB   Nuclear Pre-Procedure Caffeine/Decaff Intake:  None NPO After: 7:30am   Lungs:  clear O2 Sat: 91% on room air. IV 0.9% NS with Angio Cath:  22g  IV Site: R Hand  IV Started by:  Crissie Figures, RN  Chest Size (in):  34 Cup Size: B  Height: 4\' 10"  (1.473 m)  Weight:  145 lb (65.772 kg)  BMI:  Body mass index is 30.31 kg/(m^2). Tech Comments:  N/A    Nuclear Med Study 1 or 2 day study: 1 day  Stress Test Type:  Stress  Reading MD: N/A  Order Authorizing Provider:  Dorris Carnes, MD  Resting Radionuclide: Technetium 55m Sestamibi  Resting Radionuclide Dose: 11.0 mCi   Stress Radionuclide:  Technetium 54m Sestamibi  Stress Radionuclide Dose: 33.0 mCi           Stress Protocol Rest HR: 90 Stress HR: 134  Rest BP: 126/77 Stress BP: 199/102  Exercise Time (min): 8:00 METS: 8.0           Dose of Adenosine (mg):  n/a Dose of Lexiscan: n/a mg  Dose of Atropine (mg): n/a Dose of Dobutamine: n/a mcg/kg/min (at max HR)  Stress Test Technologist: Glade Lloyd, BS-ES  Nuclear Technologist:  Annye Rusk, CNMT     Rest Procedure:  Myocardial perfusion imaging was performed at rest 45 minutes following the intravenous administration of Technetium 69m Sestamibi. Rest ECG: NSR with non-specific ST-T wave changes  Stress Procedure:  The patient exercised on the treadmill utilizing the Bruce Protocol for 8:00 minutes. The patient stopped due to fatigue, SOB and had 8-9/10 chest discomfort.  Technetium 53m Sestamibi was injected at peak exercise and myocardial  perfusion imaging was performed after a brief delay.  Stress ECG: No significant change from baseline ECG  QPS Raw Data Images:  Normal; no motion artifact; normal heart/lung ratio. Stress Images:  Normal homogeneous uptake in all areas of the myocardium. Rest Images:  Normal homogeneous uptake in all areas of the myocardium. Subtraction (SDS):  Normal Transient Ischemic Dilatation (Normal <1.22):  0.80 Lung/Heart Ratio (Normal <0.45):  0.37  Quantitative Gated Spect Images QGS EDV:  36 ml QGS ESV:  8 ml  Impression Exercise Capacity:  Fair exercise capacity. BP Response:  Hypertensive blood pressure response. Clinical Symptoms:  There is dyspnea. ECG Impression:  No significant ST segment change suggestive of ischemia. Comparison with Prior Nuclear Study: No images to compare  Overall Impression:  Normal stress nuclear study.  LV Ejection Fraction: 77%.  LV Wall Motion:  NL LV Function; NL Wall Motion    Jenkins Rouge

## 2013-10-07 ENCOUNTER — Ambulatory Visit (INDEPENDENT_AMBULATORY_CARE_PROVIDER_SITE_OTHER): Payer: Medicare Other

## 2013-10-07 ENCOUNTER — Ambulatory Visit (INDEPENDENT_AMBULATORY_CARE_PROVIDER_SITE_OTHER): Payer: Medicare Other | Admitting: Podiatry

## 2013-10-07 ENCOUNTER — Ambulatory Visit: Payer: Medicare Other | Admitting: Internal Medicine

## 2013-10-07 ENCOUNTER — Encounter: Payer: Self-pay | Admitting: Podiatry

## 2013-10-07 VITALS — BP 130/78 | HR 86 | Resp 16

## 2013-10-07 DIAGNOSIS — M2010 Hallux valgus (acquired), unspecified foot: Secondary | ICD-10-CM

## 2013-10-07 DIAGNOSIS — M779 Enthesopathy, unspecified: Secondary | ICD-10-CM

## 2013-10-07 NOTE — Progress Notes (Signed)
Subjective:     Patient ID: Destiny Roth, female   DOB: 04-05-1950, 63 y.o.   MRN: [id number]  HPI patient states that I still gets some pain especially in my right big toe joint but I did walk all over it really with minimal discomfort and it seems like it's gradually getting better. Patient did have bunion surgery bilateral with some movement on the right first metatarsal   Review of Systems     Objective:   Physical Exam Neurovascular status intact with good range of motion first MPJ both feet with no crepitus and good dorsiflexion plantar flexion with no restriction of motion noted    Assessment:     Gradually improving from having had some movement of the capital fragment right and continued healing left first metatarsal    Plan:     X-rays reviewed with patient and discussed the right foot and recommended that she continue with exercises and that should gradually get better. Reappoint 3 months to recheck

## 2013-10-08 ENCOUNTER — Ambulatory Visit (INDEPENDENT_AMBULATORY_CARE_PROVIDER_SITE_OTHER): Payer: Medicare Other | Admitting: Internal Medicine

## 2013-10-08 ENCOUNTER — Encounter: Payer: Self-pay | Admitting: Neurology

## 2013-10-08 ENCOUNTER — Ambulatory Visit (INDEPENDENT_AMBULATORY_CARE_PROVIDER_SITE_OTHER): Payer: Medicare Other | Admitting: Neurology

## 2013-10-08 ENCOUNTER — Encounter: Payer: Self-pay | Admitting: Internal Medicine

## 2013-10-08 VITALS — BP 100/66 | HR 80 | Resp 16 | Ht 59.0 in | Wt 145.0 lb

## 2013-10-08 VITALS — BP 140/70 | HR 86 | Ht <= 58 in | Wt 144.0 lb

## 2013-10-08 DIAGNOSIS — R0602 Shortness of breath: Secondary | ICD-10-CM | POA: Diagnosis not present

## 2013-10-08 DIAGNOSIS — R7301 Impaired fasting glucose: Secondary | ICD-10-CM | POA: Diagnosis not present

## 2013-10-08 DIAGNOSIS — F329 Major depressive disorder, single episode, unspecified: Secondary | ICD-10-CM

## 2013-10-08 DIAGNOSIS — I10 Essential (primary) hypertension: Secondary | ICD-10-CM | POA: Diagnosis not present

## 2013-10-08 DIAGNOSIS — R000 Tachycardia, unspecified: Secondary | ICD-10-CM | POA: Diagnosis not present

## 2013-10-08 DIAGNOSIS — F3289 Other specified depressive episodes: Secondary | ICD-10-CM | POA: Diagnosis not present

## 2013-10-08 DIAGNOSIS — G20 Parkinson's disease: Secondary | ICD-10-CM

## 2013-10-08 DIAGNOSIS — E781 Pure hyperglyceridemia: Secondary | ICD-10-CM | POA: Diagnosis not present

## 2013-10-08 LAB — BASIC METABOLIC PANEL
BUN: 23 mg/dL (ref 6–23)
CO2: 31 mEq/L (ref 19–32)
Calcium: 9.7 mg/dL (ref 8.4–10.5)
Chloride: 98 mEq/L (ref 96–112)
Creatinine, Ser: 1 mg/dL (ref 0.4–1.2)
GFR: 62.74 mL/min (ref >60.00)
GLUCOSE: 80 mg/dL (ref 70–99)
POTASSIUM: 3.3 meq/L — AB (ref 3.5–5.1)
Sodium: 136 mEq/L (ref 135–145)

## 2013-10-08 LAB — TSH: TSH: 0.71 u[IU]/mL (ref 0.35–4.50)

## 2013-10-08 LAB — CBC
HEMATOCRIT: 41.3 % (ref 36.0–46.0)
HEMOGLOBIN: 14 g/dL (ref 12.0–15.0)
MCHC: 33.9 g/dL (ref 30.0–36.0)
MCV: 90 fl (ref 78.0–100.0)
Platelets: 352 10*3/uL (ref 150.0–400.0)
RBC: 4.59 Mil/uL (ref 3.87–5.11)
RDW: 12.9 % (ref 11.5–15.5)
WBC: 5.6 10*3/uL (ref 4.0–10.5)

## 2013-10-08 LAB — BRAIN NATRIURETIC PEPTIDE: Pro B Natriuretic peptide (BNP): 9 pg/mL (ref 0.0–100.0)

## 2013-10-08 LAB — HEMOGLOBIN A1C: Hgb A1c MFr Bld: 5.4 % (ref 4.6–6.5)

## 2013-10-08 MED ORDER — METOPROLOL TARTRATE 25 MG PO TABS: 25.0000 mg | ORAL_TABLET | Freq: Two times a day (BID) | ORAL | Status: DC

## 2013-10-08 MED ORDER — PRAMIPEXOLE DIHYDROCHLORIDE 1 MG PO TABS: 1.0000 mg | ORAL_TABLET | Freq: Three times a day (TID) | ORAL | Status: DC

## 2013-10-08 MED ORDER — DILTIAZEM HCL ER COATED BEADS 120 MG PO CP24: 120.0000 mg | ORAL_CAPSULE | Freq: Two times a day (BID) | ORAL | Status: DC

## 2013-10-08 NOTE — Progress Notes (Addendum)
HPI Patient is a 63 yo with a history of HTN and  tachycardia. Holter showed SR/ST with average HR of 90   When I saw her I recomm that she increase fluid/salt since SG on UA was high I saw the patinet in June  She was seen by Gerrianne Scale in July .  Had some chest tightness    Chest pressure Sched for myoview, echo, carotid USN   All normal  Yesterday shopping  Got exhausted  These symptoms are common now  Began in [place] this summer  Still with water retention.   No PND Wt is up 20# sNo Known Allergies  Current Outpatient Prescriptions  Medication Sig Dispense Refill  . aspirin 81 MG tablet Take 81 mg by mouth daily.      Marland Kitchen atorvastatin (LIPITOR) 20 MG tablet Take 1 tablet (20 mg total) by mouth daily.  90 tablet  3  . diltiazem (CARDIZEM CD) 120 MG 24 hr capsule Take 1 capsule (120 mg total) by mouth 2 (two) times daily.  90 capsule  3  . DULoxetine (CYMBALTA) 60 MG capsule Take 1 capsule (60 mg total) by mouth daily.  90 capsule  3  . hydrochlorothiazide (HYDRODIURIL) 25 MG tablet Take 1 tablet (25 mg total) by mouth daily.  30 tablet  4  . irbesartan (AVAPRO) 300 MG tablet Take 1 tablet (300 mg total) by mouth daily.  90 tablet  3  . metoprolol (LOPRESSOR) 25 MG tablet Take 1 tablet (25 mg total) by mouth 2 (two) times daily.  180 tablet  3  . Pramipexole Dihydrochloride (MIRAPEX ER) 3 MG TB24 Take by mouth.      . pramipexole (MIRAPEX) 1 MG tablet Take 1 tablet (1 mg total) by mouth 3 (three) times daily.  270 tablet  3   No current facility-administered medications for this visit.    Past Medical History  Diagnosis Date  . GLUCOSE INTOLERANCE 05/24/2008  . HYPERLIPIDEMIA 09/23/2006  . ANXIETY 09/23/2006  . DEPRESSION 09/23/2006  . CARPAL TUNNEL SYNDROME, BILATERAL 09/23/2006  . HYPERTENSION 09/23/2006  . GERD 05/24/2008  . DIVERTICULOSIS, COLON 09/23/2006  . IBS 09/23/2006  . BUNION, RIGHT FOOT 09/18/2009  . OSTEOPOROSIS 05/24/2008  . Abdominal pain, epigastric 09/18/2009  . Impaired  glucose tolerance 11/04/2010  . Anosmia 11/06/2010  . Parkinson's disease     Past Surgical History  Procedure Laterality Date  . Tubal ligation    . Nasal septum repair    . S/p ganglion cyst right wrist    . S/p bilat cts    . S/p right thumb trigger finger surgury    . S/p fibroid ablation    . Esophagogastroduodenoscopy  2008    Dr. Harriette Bouillon duodenal ulcer  . Colonoscopy w/ biopsies  2008    Dr. Collene Mares -negative random bxs  . Cataract extraction    . Shoulder surgery Right 2015  . Foot surgery Bilateral 2015    Family History  Problem Relation Age of Onset  . Peripheral vascular disease Mother     with bilat amputations  . Heart attack Mother   . Hypertension Father   . Heart disease Father 17    History   Social History  . Marital Status: Married    Spouse Name: N/A    Number of Children: 2  . Years of Education: N/A   Occupational History  . Not on file.   Social History Main Topics  . Smoking status: Never Smoker   . Smokeless  tobacco: Never Used  . Alcohol Use: Yes     Comment: Occ  . Drug Use: No  . Sexual Activity: Yes    Partners: Male    Birth Control/ Protection: Post-menopausal   Other Topics Concern  . Not on file   Social History Narrative  . No narrative on file    Review of Systems:  All systems reviewed.  They are negative to the above problem except as previously stated.  Vital Signs: BP 140/70  Pulse 86  Ht 4\' 10"  (1.473 m)  Wt 144 lb (65.318 kg)  BMI 30.10 kg/m2  Physical Exam Patient is in NAD   HEENT:  Normocephalic, atraumatic. EOMI, PERRLA.  Neck: JVP is normal.  No bruits.  Lungs: clear to auscultation. No rales no wheezes.  Heart: Regular rate and rhythm. Normal S1, S2. No S3.   No significant murmurs. PMI not displaced.  Abdomen:  Supple, nontender. Normal bowel sounds. No masses. No hepatomegaly.  Extremities:   R shoulder very tender.. Tr lower extremity edema.  Musculoskeletal :moving all extremities.  Neuro:    alert and oriented x3.  CN II-XII grossly intact.  Assessment and Plan: 1.  Dypsnea/chest pressure  Recent tests were unremarkable  She does have triv edema. Also notes wt is up  I would check labs  ? Additional diuresis.  ? If symptoms related to wt gain.  I would not sched further testing  Should f/u with Ob/GYN 1.  Tachycardia Denies symptoms of palpitations  2. Fall  Patient broke clavicle on last visit.   3.  HTN  Follow   2.  parkinson's   3.  HL  Keep on lipitor.

## 2013-10-08 NOTE — Patient Instructions (Signed)
1. Pramipexole 1 mg tablets sent to [facility] to take three times daily - with a note not to fill until you contact them.

## 2013-10-08 NOTE — Progress Notes (Signed)
Subjective:   Destiny Roth was seen in f/u for PD diagnosed 10/2011.  The patient is a 63 y.o. right handed female with a history of tremor.Onset of symptoms was at end of 2012.   She has noted a gradual loss of smell and taste over the last 20 years.  She even had a rhinoplasty and septoplasty in 1993 to see if that would help and it did not.  She can smell almost nothing now.    She is on Mirapex ER 1.5 mg daily.  The patient states that the tremor is virtually gone, with the exception of when she is stressed at work and then she will notice tremor in both legs.  Her walking is much better.  She feels more stable.  .  She does have some dizziness in the early morning and is unsure if that is related to Mirapex, her blood pressure medication or potassium.  No compulsive behaviors have been noted with the Mirapex.  There have been no sleep attacks.  She has not yet started exercising.  She is going to be changing jobs which is stressful for her.  10/12/12 update:  She has had some tremor internally and in the legs.  She notices some word finding trouble.   She has been under increased stress.  Her husband had back surgery 2 weeks ago and she has been the primary caregiver.  In addition, she is between jobs and needs to find a new job.  She would like to see if she can get off her blood pressure medication.  02/08/13 update:  Last visit, I increased the patient's Mirapex to a total of 2.25 mg daily.  The patient states that it definitely helped the tremor.  She almost never sees tremor any more.  She is, however, complaining about weight gain.  Her weight has been stable for many months, but when we looked back she gained weight between her May, 2014 visit and her September, 2014 visit.  She is frustrated that she cannot get that off.  In addition, she complains of some low energy and vaginal dryness.  I did look at her prior medical records since last visit.  She was complaining to her primary care  physician about dizziness and her blood pressure medication was decreased.  She did see Dr. Elsworth Soho since last visit and a split-night sleep study was recommended.  She states that she is scheduled to have foot surgery for a bunion on February 17 2013.  She had a procedure this morning to "break up a calcification" in the right shoulder.  06/07/13 update:  Pt is f/u today.  The records that were made available to me were reviewed since last visit.  Pt had surgery on both feet on separate dates for bunyionectomies.  Had tachycardia after the first and ended up having a holter monitor but HR was normal on the monitor.  Diltiazem was decreased, pindolol was added.  She had a PSG done since last visit and had an AHI of 12.   No CPAP has been recommended at this point.  She also had a R shoulder surgery on April 7 but those records are not available to me.  She does c/o if bending over, she will have lightheadedness but separate from that she has had 2 episodes of vertigo (room spinning).  Only tremor in the R hand if she has to hold out the arm that she has surgery on and then she experiences pain, so she is  not sure if the tremor is related to the pain or the surgery.  She is planning to go to [place] in about 5 weeks and is worried that she will not feel better by then.  Overall, since the arm surgery and feet surgery, she just does not feel back to herself.  She has not been able to exercise.  10/08/13 update:  The patient is following up today regarding her Parkinson's disease.  Last visit, I increased her Mirapex to 3.0 mg daily.  She is also on Cymbalta.  She has been having more fatigue and shortness of breath.  She just had a nuclear stress test done that was normal.  Her left ventricular ejection fraction was 77%.  She denies falls.  No syncope.  She is not sleepy, just fatigued during the day.  Saw Dr. Harrington Challenger today and increasing diltiazem and metoprolol is being decreased.  Current/Previously tried tremor  medications: Klonopin  Current medications that may exacerbate tremor:  ?Cymbalta.  I reviewed the MRI of the brain done at the end of 2013 with her.  This revealed some mild small vessel disease, likely secondary to hypertension.  Otherwise, it was unremarkable.  The small vessel disease was not within the basal ganglia.  I showed her the films in the office today.   No Known Allergies  Current Outpatient Prescriptions on File Prior to Visit  Medication Sig Dispense Refill  . aspirin 81 MG tablet Take 81 mg by mouth daily.      . DULoxetine (CYMBALTA) 60 MG capsule Take 1 capsule (60 mg total) by mouth daily.  90 capsule  3  . hydrochlorothiazide (HYDRODIURIL) 25 MG tablet Take 1 tablet (25 mg total) by mouth daily.  30 tablet  4  . irbesartan (AVAPRO) 300 MG tablet Take 1 tablet (300 mg total) by mouth daily.  90 tablet  3  . atorvastatin (LIPITOR) 20 MG tablet Take 1 tablet (20 mg total) by mouth daily.  90 tablet  3   No current facility-administered medications on file prior to visit.    Past Medical History  Diagnosis Date  . GLUCOSE INTOLERANCE 05/24/2008  . HYPERLIPIDEMIA 09/23/2006  . ANXIETY 09/23/2006  . DEPRESSION 09/23/2006  . CARPAL TUNNEL SYNDROME, BILATERAL 09/23/2006  . HYPERTENSION 09/23/2006  . GERD 05/24/2008  . DIVERTICULOSIS, COLON 09/23/2006  . IBS 09/23/2006  . BUNION, RIGHT FOOT 09/18/2009  . OSTEOPOROSIS 05/24/2008  . Abdominal pain, epigastric 09/18/2009  . Impaired glucose tolerance 11/04/2010  . Anosmia 11/06/2010  . Parkinson's disease     Past Surgical History  Procedure Laterality Date  . Tubal ligation    . Nasal septum repair    . S/p ganglion cyst right wrist    . S/p bilat cts    . S/p right thumb trigger finger surgury    . S/p fibroid ablation    . Esophagogastroduodenoscopy  2008    Dr. Harriette Bouillon duodenal ulcer  . Colonoscopy w/ biopsies  2008    Dr. Collene Mares -negative random bxs  . Cataract extraction    . Shoulder surgery Right 2015  . Foot  surgery Bilateral 2015    History   Social History  . Marital Status: Married    Spouse Name: N/A    Number of Children: 2  . Years of Education: N/A   Occupational History  . Not on file.   Social History Main Topics  . Smoking status: Never Smoker   . Smokeless tobacco: Never Used  . Alcohol Use:  Yes     Comment: Occ  . Drug Use: No  . Sexual Activity: Yes    Partners: Male    Birth Control/ Protection: Post-menopausal   Other Topics Concern  . Not on file   Social History Narrative  . No narrative on file    Family Status  Relation Status Death Age  . Father Deceased 47    MI, hypertension since teens  . Mother Deceased     05/16/2001, PVD, [place], [place]  . Brother Alive     3, HTN  . Child Alive     2, alive and well  . Maternal Grandmother Deceased     PD    Review of Systems A complete 10 system ROS was obtained and was negative apart from what is mentioned.   Objective:   VITALS:   Filed Vitals:   10/08/13 1503  BP: 100/66  Pulse: 80  Resp: 16  Height: 4\' 11"  (1.499 m)  Weight: 145 lb (65.772 kg)   Wt Readings from Last 3 Encounters:  10/08/13 145 lb (65.772 kg)  10/08/13 144 lb (65.318 kg)  10/01/13 145 lb (65.772 kg)    GEN:  The patient appears stated age (actually younger) and is in NAD. HEENT:  Normocephalic, atraumatic.  The mucous membranes are moist. The superficial temporal arteries are without ropiness or tenderness. CV:  RRR Lungs:  CTAB Neck/HEME:  There are no carotid bruits bilaterally.  Neurological examination:  Orientation: The patient is alert and oriented x3. Fund of knowledge is appropriate.  Recent and remote memory are intact.  Attention and concentration are normal.    Able to name objects and repeat phrases. Cranial nerves: There is good facial symmetry.   Extraocular muscles are intact.  There are no square wave jerks. The speech is fluent and clear. Soft palate rises symmetrically and there is no tongue deviation.  Hearing is intact to conversational tone. Sensation: Sensation is intact to light to light touch throughout. Motor: Strength is 5/5 in the bilateral upper and lower extremities.   Shoulder shrug is equal and symmetric.  There is no pronator drift. Deep tendon reflexes: Deep tendon reflexes are 3+/4 at the bilateral biceps, triceps, brachioradialis, patella and achilles. Plantar responses are downgoing bilaterally.  Movement examination: Tone: There is normal tone bilaterally Abnormal movements: There was no tremor today even with distraction procedures. Coordination:  There is normal RAM's today Gait and Station:    Her gait had a fairly normal stride length today.  Arm swing was good.  She is stable today.  LABS:  Lab Results  Component Value Date   WBC 5.6 10/08/2013   HGB 14.0 10/08/2013   HCT 41.3 10/08/2013   MCV 90.0 10/08/2013   PLT 352.0 10/08/2013     Chemistry      Component Value Date/Time   NA 136 10/08/2013 1325   K 3.3* 10/08/2013 1325   CL 98 10/08/2013 1325   CO2 31 10/08/2013 1325   BUN 23 10/08/2013 1325   CREATININE 1.0 10/08/2013 1325      Component Value Date/Time   CALCIUM 9.7 10/08/2013 1325   ALKPHOS 69 02/24/2013 1623   AST 23 02/24/2013 1623   ALT 27 02/24/2013 1623   BILITOT 1.0 02/24/2013 1623     Lab Results  Component Value Date   VITAMINB12 443 11/26/2011   Lab Results  Component Value Date   FOLATE 13.5 11/26/2011   Lab Results  Component Value Date   TSH 0.71  10/08/2013        Assessment/Plan:   1.  Akinetic rigid Parkinson's disease.  -I am going to continue the Mirapex, but need to change her from extended release because of going to Medicare and therefore she will be changed to pramipexole 1.0 mg 3 times per day. Risks, benefits, side effects and alternative therapies were discussed.  The opportunity to ask questions was given and they were answered to the best of my ability.  The patient expressed understanding and willingness to follow the  outlined treatment protocols. 2.  Fatigue  -I really do not think it is from the Parkinson's disease.  She is describing some shortness of breath with it.  Cardiac workup was unremarkable.  Her beta blocker is being decreased.  Hopefully, this will help.  Her blood pressure has been fluctuating some and I asked her to keep monitoring this.  -asked her to Followup with her primary care physician to make sure that there is not an etiology that we are missing.  Probably needs new labs done anyway.  Last triglycerides were very elevated 7 months ago.  Discussed proper diet and exercise.  Patient education provided. 3.  Depression  -This is common in patients with Parkinson's disease and she should remain on the Cymbalta, which is working well for her. 4.  Return in about 4 months (around 02/07/2014) for end of day appt.

## 2013-10-08 NOTE — Patient Instructions (Addendum)
Your physician has recommended you make the following change in your medication: 1.) INCREASE DILTIAZEM TO 120 MG TWICE DAILY 2.) DECREASE METOPROLOL TO 25 MG TWICE DAILY  Your physician recommends that you return for lab work TODAY (BMET, CBC, BNP, DDIMER, TSH, A1C)

## 2013-10-11 ENCOUNTER — Other Ambulatory Visit: Payer: Self-pay | Admitting: Internal Medicine

## 2013-10-11 ENCOUNTER — Telehealth: Payer: Self-pay | Admitting: *Deleted

## 2013-10-11 DIAGNOSIS — R0600 Dyspnea, unspecified: Secondary | ICD-10-CM

## 2013-10-11 DIAGNOSIS — E876 Hypokalemia: Secondary | ICD-10-CM

## 2013-10-11 DIAGNOSIS — R0789 Other chest pain: Secondary | ICD-10-CM

## 2013-10-11 DIAGNOSIS — Z01812 Encounter for preprocedural laboratory examination: Secondary | ICD-10-CM

## 2013-10-11 MED ORDER — POTASSIUM CHLORIDE ER 10 MEQ PO TBCR: 10.0000 meq | EXTENDED_RELEASE_TABLET | Freq: Every day | ORAL | Status: DC

## 2013-10-11 NOTE — Telephone Encounter (Signed)
Notes Recorded by Fay Records, MD on 10/11/2013 at 8:24 AM Needs 10 KCL as supplement ON HCTZ WOUld follow up BMET in 2 wks.  CBC, fluid number, thyroid function are normal Hgb A1 C is normal NO evidence of DM   Spoke with patient who is out of town and did not want me to send medication to a pharmacy there. She prefers to increase dietary consumption of potassium until she gets home. She has 72mEq potassium at home and will begin to take daily on Thursday AM when she gets home. Continues to feel fatigue, heat intolerance, exhaustion while on the trip.

## 2013-10-12 ENCOUNTER — Other Ambulatory Visit: Payer: Self-pay | Admitting: Neurology

## 2013-10-12 MED ORDER — DULOXETINE HCL 60 MG PO CPEP: 60.0000 mg | ORAL_CAPSULE | Freq: Every day | ORAL | Status: DC

## 2013-10-12 NOTE — Telephone Encounter (Signed)
Cymbalta refill requested. Per last office note- patient to remain on medication with verbal okay per Dr Tat. Refill approved and sent to patient's pharmacy via fax [phone].

## 2013-10-15 NOTE — Telephone Encounter (Signed)
Follow up      Returning a Destiny Roth's call.  She has been away on vacation

## 2013-10-15 NOTE — Telephone Encounter (Signed)
Late entry from 10/11/13:   Pt wants to know if her tsh level could be the cause. Advised I would discuss with Dr. Harrington Challenger and will call her back.

## 2013-10-21 NOTE — Telephone Encounter (Signed)
I have seen patient in clinic and she complains of fatigue   Stress test and echo were OK ? Thoughts from pulmonary standpoint.   May be wt and deconditioning.

## 2013-10-21 NOTE — Telephone Encounter (Signed)
lmtcb x1 for pt. 

## 2013-10-21 NOTE — Telephone Encounter (Signed)
Thyroid function is normal

## 2013-10-21 NOTE — Telephone Encounter (Signed)
Fatigue & somnolence could be due to mild-mod OSA , esp if other etiologies neg She had deferred Rx last visit Can schedule revisit if interested in Rx now

## 2013-10-26 ENCOUNTER — Other Ambulatory Visit: Payer: Medicare Other

## 2013-10-26 ENCOUNTER — Other Ambulatory Visit (INDEPENDENT_AMBULATORY_CARE_PROVIDER_SITE_OTHER): Payer: Medicare Other | Admitting: *Deleted

## 2013-10-26 DIAGNOSIS — E876 Hypokalemia: Secondary | ICD-10-CM | POA: Diagnosis not present

## 2013-10-26 LAB — BASIC METABOLIC PANEL
BUN: 14 mg/dL (ref 6–23)
CO2: 25 mEq/L (ref 19–32)
Calcium: 9.9 mg/dL (ref 8.4–10.5)
Chloride: 101 mEq/L (ref 96–112)
Creatinine, Ser: 0.9 mg/dL (ref 0.4–1.2)
GFR: 64.29 mL/min (ref >60.00)
GLUCOSE: 96 mg/dL (ref 70–99)
POTASSIUM: 3.5 meq/L (ref 3.5–5.1)
SODIUM: 135 meq/L (ref 135–145)

## 2013-10-27 ENCOUNTER — Telehealth: Payer: Self-pay

## 2013-10-27 NOTE — Telephone Encounter (Signed)
Message copied by Theodoro Parma on Wed Oct 27, 2013 11:39 AM ------      Message from: Dorris Carnes V      Created: Wed Oct 27, 2013  8:03 AM       K is normal.  Keep on this regimen. ------

## 2013-10-27 NOTE — Telephone Encounter (Signed)
Called patient to inform of lab results. Patient c/o constant, moderate SOB, lethargy, and intermittent chest tightness that has not resolved since last visit.  She also st she has edema in face, hands, legs and feet that waxes and wanes. Patient inquiring about different medications or having a heart catheterization. Informed patient her suggestions would be forwarded to Dr. Harrington Challenger for review. Instructed patient to call the office if her symptoms worsen.

## 2013-11-02 NOTE — Telephone Encounter (Signed)
Follow up     Patient calling would it be worth while to seeking second opinion still having same symptoms  for about a year.     husband on the phone now stating wife has specific problems . Asking for patient to see Dr. Marlou Porch.

## 2013-11-03 NOTE — Telephone Encounter (Signed)
I spoke with the pt and scheduled her for Right and Left heart cath on 11/09/13 at 10:30 with Dr Martinique.  The pt will arrive at 8:30 AM.  Pre procedure labs ordered and the pt will come into the office on 11/05/13 for lab work.

## 2013-11-03 NOTE — Telephone Encounter (Signed)
Spoke with patient and husband.  Patient is still SOB with exertion. Would recomm R and L heart cath to make definitive diagnosis. Risks/benefits for test explained  Patient understands. WIll have someone call to set up for this as outpatient Patient may need  Labs prior depending on date of procedure.

## 2013-11-04 ENCOUNTER — Encounter (HOSPITAL_COMMUNITY): Payer: Self-pay | Admitting: Pharmacy Technician

## 2013-11-05 ENCOUNTER — Other Ambulatory Visit (INDEPENDENT_AMBULATORY_CARE_PROVIDER_SITE_OTHER): Payer: Medicare Other | Admitting: *Deleted

## 2013-11-05 DIAGNOSIS — R0789 Other chest pain: Secondary | ICD-10-CM | POA: Diagnosis not present

## 2013-11-05 DIAGNOSIS — Z01812 Encounter for preprocedural laboratory examination: Secondary | ICD-10-CM | POA: Diagnosis not present

## 2013-11-05 DIAGNOSIS — R0600 Dyspnea, unspecified: Secondary | ICD-10-CM | POA: Diagnosis not present

## 2013-11-05 LAB — CBC
HEMATOCRIT: 42.4 % (ref 36.0–46.0)
HEMOGLOBIN: 14.2 g/dL (ref 12.0–15.0)
MCHC: 33.5 g/dL (ref 30.0–36.0)
MCV: 89.4 fl (ref 78.0–100.0)
PLATELETS: 368 10*3/uL (ref 150.0–400.0)
RBC: 4.74 Mil/uL (ref 3.87–5.11)
RDW: 12.3 % (ref 11.5–15.5)
WBC: 7.9 10*3/uL (ref 4.0–10.5)

## 2013-11-05 LAB — BASIC METABOLIC PANEL
BUN: 25 mg/dL — ABNORMAL HIGH (ref 6–23)
CHLORIDE: 100 meq/L (ref 96–112)
CO2: 24 mEq/L (ref 19–32)
Calcium: 10 mg/dL (ref 8.4–10.5)
Creatinine, Ser: 1 mg/dL (ref 0.4–1.2)
GFR: 62.73 mL/min (ref >60.00)
Glucose, Bld: 94 mg/dL (ref 70–99)
POTASSIUM: 4.2 meq/L (ref 3.5–5.1)
Sodium: 134 mEq/L — ABNORMAL LOW (ref 135–145)

## 2013-11-05 LAB — PROTIME-INR
INR: 1 ratio (ref 0.8–1.0)
Prothrombin Time: 11.1 s (ref 9.6–13.1)

## 2013-11-09 ENCOUNTER — Encounter (HOSPITAL_COMMUNITY)
Admission: RE | Disposition: A | Discharge status: Home / Self Care | Payer: Self-pay | Source: Ambulatory Visit | Attending: Cardiology

## 2013-11-09 ENCOUNTER — Ambulatory Visit (HOSPITAL_COMMUNITY)
Admission: RE | Admit: 2013-11-09 | Discharge: 2013-11-09 | Disposition: A | Discharge status: Home / Self Care | Payer: Medicare Other | Source: Ambulatory Visit | Attending: Cardiology | Admitting: Cardiology

## 2013-11-09 ENCOUNTER — Other Ambulatory Visit: Payer: Self-pay

## 2013-11-09 DIAGNOSIS — Z8249 Family history of ischemic heart disease and other diseases of the circulatory system: Secondary | ICD-10-CM | POA: Insufficient documentation

## 2013-11-09 DIAGNOSIS — K219 Gastro-esophageal reflux disease without esophagitis: Secondary | ICD-10-CM | POA: Diagnosis not present

## 2013-11-09 DIAGNOSIS — Z6830 Body mass index (BMI) 30.0-30.9, adult: Secondary | ICD-10-CM | POA: Insufficient documentation

## 2013-11-09 DIAGNOSIS — R7302 Impaired glucose tolerance (oral): Secondary | ICD-10-CM | POA: Diagnosis not present

## 2013-11-09 DIAGNOSIS — E785 Hyperlipidemia, unspecified: Secondary | ICD-10-CM | POA: Diagnosis not present

## 2013-11-09 DIAGNOSIS — G20 Parkinson's disease: Secondary | ICD-10-CM | POA: Insufficient documentation

## 2013-11-09 DIAGNOSIS — Z7982 Long term (current) use of aspirin: Secondary | ICD-10-CM | POA: Insufficient documentation

## 2013-11-09 DIAGNOSIS — E669 Obesity, unspecified: Secondary | ICD-10-CM | POA: Diagnosis not present

## 2013-11-09 DIAGNOSIS — I10 Essential (primary) hypertension: Secondary | ICD-10-CM | POA: Insufficient documentation

## 2013-11-09 DIAGNOSIS — I2510 Atherosclerotic heart disease of native coronary artery without angina pectoris: Secondary | ICD-10-CM | POA: Insufficient documentation

## 2013-11-09 DIAGNOSIS — R0609 Other forms of dyspnea: Secondary | ICD-10-CM | POA: Diagnosis present

## 2013-11-09 HISTORY — PX: LEFT AND RIGHT HEART CATHETERIZATION WITH CORONARY ANGIOGRAM: SHX5449

## 2013-11-09 LAB — POCT I-STAT 3, ART BLOOD GAS (G3+)
Acid-Base Excess: 1 mmol/L (ref 0.0–2.0)
BICARBONATE: 25.6 meq/L — AB (ref 20.0–24.0)
O2 Saturation: 95 %
PO2 ART: 71 mmHg — AB (ref 80.0–100.0)
TCO2: 27 mmol/L (ref 0–100)
pCO2 arterial: 38.8 mmHg (ref 35.0–45.0)
pH, Arterial: 7.427 (ref 7.350–7.450)

## 2013-11-09 LAB — POCT I-STAT 3, VENOUS BLOOD GAS (G3P V)
ACID-BASE EXCESS: 1 mmol/L (ref 0.0–2.0)
Bicarbonate: 26 mEq/L — ABNORMAL HIGH (ref 20.0–24.0)
O2 SAT: 70 %
PCO2 VEN: 41.1 mmHg — AB (ref 45.0–50.0)
PO2 VEN: 36 mmHg (ref 30.0–45.0)
TCO2: 27 mmol/L (ref 0–100)
pH, Ven: 7.409 — ABNORMAL HIGH (ref 7.250–7.300)

## 2013-11-09 SURGERY — LEFT AND RIGHT HEART CATHETERIZATION WITH CORONARY ANGIOGRAM
Anesthesia: LOCAL

## 2013-11-09 MED ORDER — SODIUM CHLORIDE 0.9 % IV SOLN: 1.0000 mL/kg/h | INTRAVENOUS | Status: DC

## 2013-11-09 MED ORDER — LIDOCAINE HCL (PF) 1 % IJ SOLN
INTRAMUSCULAR | Status: AC
  Filled 2013-11-09: qty 30

## 2013-11-09 MED ORDER — NITROGLYCERIN 1 MG/10 ML FOR IR/CATH LAB
INTRA_ARTERIAL | Status: AC
  Filled 2013-11-09: qty 10

## 2013-11-09 MED ORDER — SODIUM CHLORIDE 0.9 % IV SOLN: 250.0000 mL | INTRAVENOUS | Status: DC | PRN

## 2013-11-09 MED ORDER — HEPARIN SODIUM (PORCINE) 1000 UNIT/ML IJ SOLN
INTRAMUSCULAR | Status: AC
  Filled 2013-11-09: qty 1

## 2013-11-09 MED ORDER — ASPIRIN 81 MG PO CHEW
CHEWABLE_TABLET | ORAL | Status: AC
  Filled 2013-11-09: qty 1

## 2013-11-09 MED ORDER — SODIUM CHLORIDE 0.9 % IJ SOLN: 3.0000 mL | INTRAMUSCULAR | Status: DC | PRN

## 2013-11-09 MED ORDER — ASPIRIN 81 MG PO CHEW
81.0000 mg | CHEWABLE_TABLET | ORAL | Status: AC
  Administered 2013-11-09: 81 mg via ORAL

## 2013-11-09 MED ORDER — MIDAZOLAM HCL 2 MG/2ML IJ SOLN
INTRAMUSCULAR | Status: AC
  Filled 2013-11-09: qty 2

## 2013-11-09 MED ORDER — VERAPAMIL HCL 2.5 MG/ML IV SOLN
INTRAVENOUS | Status: AC
  Filled 2013-11-09: qty 2

## 2013-11-09 MED ORDER — FENTANYL CITRATE 0.05 MG/ML IJ SOLN
INTRAMUSCULAR | Status: AC
  Filled 2013-11-09: qty 2

## 2013-11-09 MED ORDER — SODIUM CHLORIDE 0.9 % IV SOLN
INTRAVENOUS | Status: DC
Start: 2013-11-10 — End: 2013-11-09
  Administered 2013-11-09: 10:00:00 via INTRAVENOUS

## 2013-11-09 MED ORDER — HEPARIN (PORCINE) IN NACL 2-0.9 UNIT/ML-% IJ SOLN
INTRAMUSCULAR | Status: AC
  Filled 2013-11-09: qty 1500

## 2013-11-09 MED ORDER — SODIUM CHLORIDE 0.9 % IJ SOLN
3.0000 mL | Freq: Two times a day (BID) | INTRAMUSCULAR | Status: DC
  Administered 2013-11-09: 3 mL via INTRAVENOUS

## 2013-11-09 NOTE — Discharge Instructions (Signed)
Radial Site Care °Refer to this sheet in the next few weeks. These instructions provide you with information on caring for yourself after your procedure. Your caregiver may also give you more specific instructions. Your treatment has been planned according to current medical practices, but problems sometimes occur. Call your caregiver if you have any problems or questions after your procedure. °HOME CARE INSTRUCTIONS °· You may shower the day after the procedure. Remove the bandage (dressing) and gently wash the site with plain soap and water. Gently pat the site dry. °· Do not apply powder or lotion to the site. °· Do not submerge the affected site in water for 3 to 5 days. °· Inspect the site at least twice daily. °· Do not flex or bend the affected arm for 24 hours. °· No lifting over 5 pounds (2.3 kg) for 5 days after your procedure. °· Do not drive home if you are discharged the same day of the procedure. Have someone else drive you. °· You may drive 24 hours after the procedure unless otherwise instructed by your caregiver. °· Do not operate machinery or power tools for 24 hours. °· A responsible adult should be with you for the first 24 hours after you arrive home. °What to expect: °· Any bruising will usually fade within 1 to 2 weeks. °· Blood that collects in the tissue (hematoma) may be painful to the touch. It should usually decrease in size and tenderness within 1 to 2 weeks. °SEEK IMMEDIATE MEDICAL CARE IF: °· You have unusual pain at the radial site. °· You have redness, warmth, swelling, or pain at the radial site. °· You have drainage (other than a small amount of blood on the dressing). °· You have chills. °· You have a fever or persistent symptoms for more than 72 hours. °· You have a fever and your symptoms suddenly get worse. °· Your arm becomes pale, cool, tingly, or numb. °· You have heavy bleeding from the site. Hold pressure on the site. °Document Released: 02/16/2010 Document Revised:  04/08/2011 Document Reviewed: 02/16/2010 °ExitCare® Patient Information ©2015 [employer]. This information is not intended to replace advice given to you by your health care provider. Make sure you discuss any questions you have with your health care provider. ° °

## 2013-11-09 NOTE — CV Procedure (Signed)
    Cardiac Catheterization Procedure Note  Name: Destiny Roth MRN: [id number] DOB: 09/27/1950  Procedure: Right Heart Cath, Left Heart Cath, Selective Coronary Angiography, LV angiography  Indication: 63 yo WF with refractory dyspnea.   Procedural Details: The right wrist was prepped, draped, and anesthetized with 1% lidocaine. Using the modified Seldinger technique a 6 Fr slender sheath was placed in the right radial artery and a 5 French sheath was placed in the right brachial vein. A Swan-Ganz catheter was used for the right heart catheterization. Standard protocol was followed for recording of right heart pressures and sampling of oxygen saturations. Fick cardiac output was calculated. Standard Judkins catheters were used for selective coronary angiography and left ventriculography. There were no immediate procedural complications. The patient was transferred to the post catheterization recovery area for further monitoring.  Procedural Findings: Hemodynamics RA 13/12 mean 10 mm Hg RV 38/9 mm Hg PA 39/18 mean 27 mm Hg PCWP 18/17 mean 14 mm Hg LV 125/12/20 mm Hg AO 122/69 mean 92 mm Hg  Oxygen saturations: PA 70% AO 95 %  Cardiac Output (Fick) 4.2 L/min  Cardiac Index (Fick) 2.76 L/min/meter squared   Coronary angiography: Coronary dominance: right  Left mainstem: Mildly calcified with diffuse 20% narrowing.   Left anterior descending (LAD): Moderate calcification with 30-40% stenosis in the proximal vessel. The remainder of the LAD and the diagonals are normal.  Left circumflex (LCx): Normal.  Right coronary artery (RCA): Mild calcification with 10% wall irregularities.  Left ventriculography: Left ventricular systolic function is vigorous, LVEF is estimated at 65-70%, there is no significant mitral regurgitation   Final Conclusions:   1. Nonobstructive CAD 2. Normal LV function. 3. Upper normal right heart pressures with normal LV filling  pressures.  Recommendations: Risk factor modification. Consider pulmonary evaluation.    [name], [place] 11/09/2013, 1:52 PM

## 2013-11-09 NOTE — Interval H&P Note (Signed)
History and Physical Interval Note:  11/09/2013 12:56 PM  Destiny Roth  has presented today for surgery, with the diagnosis of sob  The various methods of treatment have been discussed with the patient and family. After consideration of risks, benefits and other options for treatment, the patient has consented to  Procedure(s): LEFT AND RIGHT HEART CATHETERIZATION WITH CORONARY ANGIOGRAM (N/A) as a surgical intervention .  The patient's history has been reviewed, patient examined, no change in status, stable for surgery.  I have reviewed the patient's chart and labs.  Questions were answered to the patient's satisfaction.   Cath Lab Visit (complete for each Cath Lab visit)  Clinical Evaluation Leading to the Procedure:   ACS: No.  Non-ACS:    Anginal Classification: CCS III  Anti-ischemic medical therapy: Maximal Therapy (2 or more classes of medications)  Non-Invasive Test Results: Low-risk stress test findings: cardiac mortality <1%/year  Prior CABG: No previous CABG        Destiny Roth [facility] 11/09/2013 12:57 PM

## 2013-11-09 NOTE — Progress Notes (Signed)
Site area: right brachial sheath a 7 french sheath was removed   Site Prior to Removal:  Level 0  Pressure Applied For 15 MINUTES    Minutes Beginning at 1420  Manual:   Yes.    Patient Status During Pull:  stable  Post Pull Groin Site:  Level 0  Post Pull Instructions Given:  Yes  Post Pull Pulses Present:  Yes.    Dressing Applied:  Yes.    Comments:  VS remain stable thru sheath pull.  Pt resting with eyes closed with normal respirations.

## 2013-11-09 NOTE — H&P (Signed)
Physician History and Physical    Patient ID: Destiny Roth MRN: [id number] DOB/AGE: [birth year] 63 y.o. Admit date: 11/09/2013  Primary Care Physician: Cathlean Cower, MD Primary Cardiologist Dorris Carnes MD  HPI: 63 yo WF seen for evaluation of refractory dyspnea. Seen by Dr. Harrington Challenger. Extensive cardiac evaluation including ETT, Echo, and stress Myoview that have been normal. She continues to complain of dyspnea and chest tightness on exertion. Also complains of fluid retention. Due to refractory symptoms right and left heart cath recommended for definitive diagnosis. She does have a history of HTN, hyperlipidemia, and family history of CAD.  Review of systems complete and found to be negative unless listed above  Past Medical History  Diagnosis Date  . GLUCOSE INTOLERANCE 05/24/2008  . HYPERLIPIDEMIA 09/23/2006  . ANXIETY 09/23/2006  . DEPRESSION 09/23/2006  . CARPAL TUNNEL SYNDROME, BILATERAL 09/23/2006  . HYPERTENSION 09/23/2006  . GERD 05/24/2008  . DIVERTICULOSIS, COLON 09/23/2006  . IBS 09/23/2006  . BUNION, RIGHT FOOT 09/18/2009  . OSTEOPOROSIS 05/24/2008  . Abdominal pain, epigastric 09/18/2009  . Impaired glucose tolerance 11/04/2010  . Anosmia 11/06/2010  . Parkinson's disease     Family History  Problem Relation Age of Onset  . Peripheral vascular disease Mother     with bilat amputations  . Heart attack Mother   . Hypertension Father   . Heart disease Father 74    History   Social History  . Marital Status: Married    Spouse Name: N/A    Number of Children: 2  . Years of Education: N/A   Occupational History  . Not on file.   Social History Main Topics  . Smoking status: Never Smoker   . Smokeless tobacco: Never Used  . Alcohol Use: Yes     Comment: Occ  . Drug Use: No  . Sexual Activity: Yes    Partners: Male    Birth Control/ Protection: Post-menopausal   Other Topics Concern  . Not on file   Social History Narrative  . No narrative on file    Past  Surgical History  Procedure Laterality Date  . Tubal ligation    . Nasal septum repair    . S/p ganglion cyst right wrist    . S/p bilat cts    . S/p right thumb trigger finger surgury    . S/p fibroid ablation    . Esophagogastroduodenoscopy  2008    Dr. Harriette Bouillon duodenal ulcer  . Colonoscopy w/ biopsies  2008    Dr. Collene Mares -negative random bxs  . Cataract extraction    . Shoulder surgery Right 2015  . Foot surgery Bilateral 2015     Prescriptions prior to admission  Medication Sig Dispense Refill  . aspirin 81 MG tablet Take 81 mg by mouth daily.      Marland Kitchen atorvastatin (LIPITOR) 20 MG tablet TAKE 1 TABLET DAILY  90 tablet  2  . diltiazem (CARDIZEM CD) 120 MG 24 hr capsule Take 1 capsule (120 mg total) by mouth 2 (two) times daily.  90 capsule  3  . DULoxetine (CYMBALTA) 60 MG capsule Take 1 capsule (60 mg total) by mouth daily.  90 capsule  3  . hydrochlorothiazide (HYDRODIURIL) 25 MG tablet Take 1 tablet (25 mg total) by mouth daily.  30 tablet  4  . Inulin (FIBER CHOICE FRUITY BITES PO) Take 1-2 tablets by mouth daily.      . irbesartan (AVAPRO) 300 MG tablet Take 1 tablet (300 mg total) by mouth daily.  90 tablet  3  . metoprolol (LOPRESSOR) 25 MG tablet Take 1 tablet (25 mg total) by mouth 2 (two) times daily.  180 tablet  3  . Multiple Vitamins-Calcium (ONE-A-DAY WOMENS PO) Take 1 tablet by mouth daily.      . potassium chloride (K-DUR) 10 MEQ tablet Take 1 tablet (10 mEq total) by mouth daily.  90 tablet  3  . pramipexole (MIRAPEX) 1 MG tablet Take 1 tablet (1 mg total) by mouth 3 (three) times daily.  270 tablet  3  . Pramipexole Dihydrochloride (MIRAPEX ER) 3 MG TB24 Take 3 mg by mouth daily.       Marland Kitchen Propylene Glycol (SYSTANE BALANCE) 0.6 % SOLN Place 1-2 drops into both eyes daily as needed (dry eyes).        Physical Exam: Blood pressure 143/72, pulse 75, temperature 97.8 F (36.6 C), temperature source Oral, height 4\' 10"  (1.473 m), weight 130 lb (58.968 kg), SpO2  96.00%. Current Weight  11/09/13 130 lb (58.968 kg)  11/09/13 130 lb (58.968 kg)  10/08/13 145 lb (65.772 kg)    GENERAL:  Well appearing, obese female in NAD HEENT:  PERRL, EOMI, sclera are clear. Oropharynx is clear. NECK:  No jugular venous distention, carotid upstroke brisk and symmetric, no bruits, no thyromegaly or adenopathy LUNGS:  Clear to auscultation bilaterally CHEST:  Unremarkable HEART:  RRR,  PMI not displaced or sustained,S1 and S2 within normal limits, no S3, no S4: no clicks, no rubs, no murmurs ABD:  Soft, nontender. BS +, no masses or bruits. No hepatomegaly, no splenomegaly EXT:  2 + pulses throughout, no edema, no cyanosis no clubbing SKIN:  Warm and dry.  No rashes NEURO:  Alert and oriented x 3. Cranial nerves II through XII intact. PSYCH:  Cognitively intact    Labs:   Lab Results  Component Value Date   WBC 7.9 11/05/2013   HGB 14.2 11/05/2013   HCT 42.4 11/05/2013   MCV 89.4 11/05/2013   PLT 368.0 11/05/2013    Recent Labs Lab 11/05/13 1412  NA 134*  K 4.2  CL 100  CO2 24  BUN 25*  CREATININE 1.0  CALCIUM 10.0  GLUCOSE 94   No results found for this basename: CKTOTA, CKMB, CKMBINDEX, TROPONINI    Lab Results  Component Value Date   CHOL 186 02/24/2013   CHOL 173 12/29/2012   CHOL 159 11/08/2011   Lab Results  Component Value Date   HDL 43.90 02/24/2013   HDL 74.20 12/29/2012   HDL 55.30 11/08/2011   Lab Results  Component Value Date   LDLCALC 76 12/29/2012   LDLCALC 78 10/30/2010   LDLCALC 74 09/14/2009   Lab Results  Component Value Date   TRIG 507.0 Triglyceride is over 400; calculations on Lipids are invalid.* 02/24/2013   TRIG 112.0 12/29/2012   TRIG 210.0* 11/08/2011   Lab Results  Component Value Date   CHOLHDL 4 02/24/2013   CHOLHDL 2 12/29/2012   CHOLHDL 3 11/08/2011   Lab Results  Component Value Date   LDLDIRECT 76.4 02/24/2013   LDLDIRECT 79.5 11/08/2011   LDLDIRECT 86.0 10/23/2006    Lab Results  Component Value Date    PROBNP 9.0 10/08/2013   Lab Results  Component Value Date   TSH 0.71 10/08/2013   Lab Results  Component Value Date   HGBA1C 5.4 10/08/2013    Radiology: CHEST 2 VIEW  COMPARISON: 11/22/2004  FINDINGS:  Normal heart size, mediastinal contours, and pulmonary vascularity.  Lungs clear.  No pleural effusion or pneumothorax.  Bones unremarkable.  IMPRESSION:  No acute abnormalities.  Electronically Signed  By: Lavonia Dana M.D.  On: 02/24/2013 16:56  EKG: NSR, normal.  Echo:Study Conclusions  - Left ventricle: The cavity size was normal. Systolic function was normal. The estimated ejection fraction was in the range of 60% to 65%. Wall motion was normal; there were no regional wall motion abnormalities. Doppler parameters are consistent with abnormal left ventricular relaxation (grade 1 diastolic dysfunction). There was no evidence of elevated ventricular filling pressure by Doppler parameters. - Aortic valve: Sclerosis without stenosis. No regurgitation. - Aortic root: The aortic root was normal in size. - Mitral valve: No regurgitation. - Left atrium: The atrium was normal in size. - Right ventricle: The cavity size was normal. Wall thickness was normal. Systolic function was normal. - Tricuspid valve: Trivial regurgitation. - Pulmonary arteries: Systolic pressure was within the normal range. - Pericardium, extracardiac: There was no pericardial effusion. - Impressions: Overall it was a very poor quality study, but there is normal chanber size and biventricular function. No significant valvular abnormality. Impressions:  - Overall it was a very poor quality study, but there is normal chanber size and biventricular function. No significant valvular abnormality.  Cardiology Nuclear Med Study  Destiny Roth is a 63 y.o. female MRN : [id number] DOB: 1950/12/08  Procedure Date: 10/01/2013  Nuclear Med Background  Indication for Stress Test: Evaluation for  Ischemia and Abnormal GXT  History: No Cardiac History  Cardiac Risk Factors: Hypertension  Symptoms: Chest Pain and SOB  Nuclear Pre-Procedure  Caffeine/Decaff Intake: None  NPO After: 7:30am   Lungs: clear  O2 Sat: 91% on room air.  IV 0.9% NS with Angio Cath: 22g   IV Site: R Hand  IV Started by: Crissie Figures, RN   Chest Size (in): 34  Cup Size: B   Height: 4\' 10"  (1.473 m)  Weight: 145 lb (65.772 kg)   BMI: Body mass index is 30.31 kg/(m^2).  Tech Comments: N/A   Nuclear Med Study  1 or 2 day study: 1 day  Stress Test Type: Stress   Reading MD: N/A  Order Authorizing Provider: Dorris Carnes, MD   Resting Radionuclide: Technetium 66m Sestamibi  Resting Radionuclide Dose: 11.0 mCi   Stress Radionuclide: Technetium 38m Sestamibi  Stress Radionuclide Dose: 33.0 mCi   Stress Protocol  Rest HR: 90  Stress HR: 134   Rest BP: 126/77  Stress BP: 199/102   Exercise Time (min): 8:00  METS: 8.0    Dose of Adenosine (mg): n/a  Dose of Lexiscan: n/a mg   Dose of Atropine (mg): n/a  Dose of Dobutamine: n/a mcg/kg/min (at max HR)   Stress Test Technologist: Glade Lloyd, BS-ES  Nuclear Technologist: Annye Rusk, CNMT   Rest Procedure: Myocardial perfusion imaging was performed at rest 45 minutes following the intravenous administration of Technetium 33m Sestamibi.  Rest ECG: NSR with non-specific ST-T wave changes  Stress Procedure: The patient exercised on the treadmill utilizing the Bruce Protocol for 8:00 minutes. The patient stopped due to fatigue, SOB and had 8-9/10 chest discomfort. Technetium 45m Sestamibi was injected at peak exercise and myocardial perfusion imaging was performed after a brief delay.  Stress ECG: No significant change from baseline ECG  QPS  Raw Data Images: Normal; no motion artifact; normal heart/lung ratio.  Stress Images: Normal homogeneous uptake in all areas of the myocardium.  Rest Images: Normal homogeneous uptake in all areas of the  myocardium.  Subtraction  (SDS): Normal  Transient Ischemic Dilatation (Normal <1.22): 0.80  Lung/Heart Ratio (Normal <0.45): 0.37  Quantitative Gated Spect Images  QGS EDV: 36 ml  QGS ESV: 8 ml  Impression  Exercise Capacity: Fair exercise capacity.  BP Response: Hypertensive blood pressure response.  Clinical Symptoms: There is dyspnea.  ECG Impression: No significant ST segment change suggestive of ischemia.  Comparison with Prior Nuclear Study: No images to compare  Overall Impression: Normal stress nuclear study.  LV Ejection Fraction: 77%. LV Wall Motion: NL LV Function; NL Wall Motion  [place]   ASSESSMENT AND PLAN:  1. Refractory dyspnea on exertion and edema.   Plan: proceed with right and left heart cath, coronary angiography, possible PCI. The procedure and risks were reviewed including but not limited to death, myocardial infarction, stroke, arrythmias, bleeding, transfusion, emergency surgery, dye allergy, or renal dysfunction. The patient voices understanding and is agreeable to proceed.   Signed: Peter Martinique, [place]  11/09/2013, 11:58 AM

## 2013-11-09 NOTE — Progress Notes (Signed)
TR BAND REMOVAL  LOCATION:    right radial  DEFLATED PER PROTOCOL:    Yes.    TIME BAND OFF / DRESSING APPLIED:    1630   SITE UPON ARRIVAL:    Level 0  SITE AFTER BAND REMOVAL:    Level 0  CIRCULATION SENSATION AND MOVEMENT:    Within Normal Limits   Yes.    COMMENTS:   TRB removed/ tegaderm dsg applied.

## 2013-11-10 ENCOUNTER — Telehealth: Payer: Self-pay | Admitting: *Deleted

## 2013-11-10 NOTE — Telephone Encounter (Signed)
Message copied by Inge Rise on Wed Nov 10, 2013 10:08 AM ------      Message from: Kara Mead V      Created: Wed Nov 10, 2013 12:51 AM       Pl make FU OV for dyspnea eval, neg cath per dr Harrington Challenger      Next available ok      RA ------

## 2013-11-10 NOTE — Telephone Encounter (Signed)
Called pt. appt scheduled to come in 12/08/13 w/ RA at 4 PM

## 2013-11-11 ENCOUNTER — Other Ambulatory Visit: Payer: Self-pay

## 2013-11-11 ENCOUNTER — Ambulatory Visit (INDEPENDENT_AMBULATORY_CARE_PROVIDER_SITE_OTHER): Payer: Medicare Other | Admitting: Obstetrics & Gynecology

## 2013-11-11 ENCOUNTER — Encounter: Payer: Self-pay | Admitting: Obstetrics & Gynecology

## 2013-11-11 VITALS — BP 130/80 | HR 74 | Temp 98.2°F | Ht <= 58 in | Wt 146.0 lb

## 2013-11-11 DIAGNOSIS — F529 Unspecified sexual dysfunction not due to a substance or known physiological condition: Secondary | ICD-10-CM

## 2013-11-11 DIAGNOSIS — IMO0002 Reserved for concepts with insufficient information to code with codable children: Secondary | ICD-10-CM

## 2013-11-11 DIAGNOSIS — N941 Dyspareunia: Secondary | ICD-10-CM | POA: Diagnosis not present

## 2013-11-11 MED ORDER — HYDROCHLOROTHIAZIDE 25 MG PO TABS: 25.0000 mg | ORAL_TABLET | Freq: Every day | ORAL | Status: DC

## 2013-11-11 MED ORDER — DILTIAZEM HCL ER COATED BEADS 120 MG PO CP24: 120.0000 mg | ORAL_CAPSULE | Freq: Two times a day (BID) | ORAL | Status: DC

## 2013-11-11 MED ORDER — METOPROLOL TARTRATE 25 MG PO TABS: 25.0000 mg | ORAL_TABLET | Freq: Two times a day (BID) | ORAL | Status: DC

## 2013-11-11 NOTE — Patient Instructions (Signed)
Ospemifene oral tablets What is this medicine? OSPEMIFENE (os PEM i feen) is used to treat painful sexual intercourse in females after menopause, a symptom of menopause that occurs due to changes in and around the vagina. This medicine may be used for other purposes; ask your health care provider or pharmacist if you have questions. COMMON BRAND NAME(S): Osphena What should I tell my health care provider before I take this medicine? They need to know if you have any of these conditions: -cancer, such as breast, uterine, or other cancer -heart disease -history of blood clots -history of stroke -history of vaginal bleeding -liver disease -premenopausal -smoke tobacco -an unusual or allergic reaction to ospemifene, other medicines, foods, dyes, or preservatives -pregnant or trying to get pregnant -breast-feeding How should I use this medicine? Take this medicine by mouth with a glass of water. Take this medicine with food. Follow the directions on the prescription label. Do not take your medicine more often than directed. Talk to your pediatrician regarding the use of this medicine in children. Special care may be needed. Overdosage: If you think you've taken too much of this medicine contact a poison control center or emergency room at once. Overdosage: If you think you have taken too much of this medicine contact a poison control center or emergency room at once. NOTE: This medicine is only for you. Do not share this medicine with others. What if I miss a dose? If you miss a dose, take it as soon as you can. If it is almost time for your next dose, take only that dose. Do not take double or extra doses. What may interact with this medicine? -doxycycline -estrogens -fluconazole -furosemide -glyburide -ketoconazole -phenytoin -rifampin -warfarin This list may not describe all possible interactions. Give your health care provider a list of all the medicines, herbs, non-prescription  drugs, or dietary supplements you use. Also tell them if you smoke, drink alcohol, or use illegal drugs. Some items may interact with your medicine. What should I watch for while using this medicine? Visit your health care professional for regular checks on your progress. You will need a regular breast and pelvic exam and Pap smear while on this medicine. You should also discuss the need for regular mammograms with your health care professional, and follow his or her guidelines for these tests. Also, periodically discuss the need to continue taking this medicine. Taking this medicine for long periods of time may increase your risk for serious side effects. This medicine can increase the risk of developing a condition (endometrial hyperplasia) that may lead to cancer of the lining of the uterus. Taking progestins, another hormone drug, with this medicine lowers the risk of developing this condition. Therefore, if your uterus has not been removed (by a hysterectomy), your doctor may prescribe a progestin for you to take together with your estrogen. You should know, however, that taking estrogens with progestins may have additional health risks. You should discuss the use of estrogens and progestins with your health care professional to determine the benefits and risks for you. This medicine can rarely cause blood clots. You should avoid long periods of bed rest while taking this medicine. If you are going to have surgery, tell your doctor or health care professional that you are taking this medicine. This medicine should be stopped at least 4-6 weeks before surgery. After surgery, it should be restarted only after you are walking again. It should not be restarted while you still need long periods of bed   rest. You should not smoke while taking this medicine. Smoking may also increase your risk of blood clots. Smoking can also decrease the effects of this medicine. This medicine does not prevent hot flashes. It  may cause hot flashes in some patients. If you have any reason to think you are pregnant; stop taking this medicine at once and contact your doctor or health care professional. What side effects may I notice from receiving this medicine? Side effects that you should report to your doctor or health care professional as soon as possible: -breathing problems -changes in vision -confusion, trouble speaking or understanding -new breast lumps -pain, swelling, warmth in the leg -pelvic pain or pressure -severe headaches -sudden chest pain -sudden numbness or weakness of the face, arm or leg -trouble walking, dizziness, loss of balance or coordination -unusual vaginal bleeding patterns -vaginal discharge that is bloody or brown Side effects that usually do not require medical attention (Report these to your doctor or health care professional if they continue or are bothersome.): -hot flushes or flashes -increased sweating -muscle cramps -vaginal discharge (white or clear) This list may not describe all possible side effects. Call your doctor for medical advice about side effects. You may report side effects to FDA at 1-800-FDA-1088. Where should I keep my medicine? Keep out of the reach of children. Store at room temperature between 20 and 25 degrees C (68 and 77 degrees F). Protect from light. Keep container tightly closed. Throw away any unused medicine after the expiration date. NOTE: This sheet is a summary. It may not cover all possible information. If you have questions about this medicine, talk to your doctor, pharmacist, or health care provider.  2015, [employer]vier/[employer] Standard. (2013-03-30 11:46:51)  

## 2013-11-12 DIAGNOSIS — N959 Unspecified menopausal and perimenopausal disorder: Secondary | ICD-10-CM | POA: Diagnosis not present

## 2013-11-12 DIAGNOSIS — N760 Acute vaginitis: Secondary | ICD-10-CM | POA: Diagnosis not present

## 2013-11-12 NOTE — Progress Notes (Signed)
Patient ID: Destiny Roth, female   DOB: 03-24-1950, 63 y.o.   MRN: [id number]  Chief Complaint  Patient presents with  . Probelm    Menopausal problems     HPI Destiny Roth is a 63 y.o. female.  She reports recent onset of temperature intolerance, weight gain, fatigue, decreased sexual interest, pain with intercourse.  HPI  Past Medical History  Diagnosis Date  . GLUCOSE INTOLERANCE 05/24/2008  . HYPERLIPIDEMIA 09/23/2006  . ANXIETY 09/23/2006  . DEPRESSION 09/23/2006  . CARPAL TUNNEL SYNDROME, BILATERAL 09/23/2006  . HYPERTENSION 09/23/2006  . GERD 05/24/2008  . DIVERTICULOSIS, COLON 09/23/2006  . IBS 09/23/2006  . BUNION, RIGHT FOOT 09/18/2009  . OSTEOPOROSIS 05/24/2008  . Abdominal pain, epigastric 09/18/2009  . Impaired glucose tolerance 11/04/2010  . Anosmia 11/06/2010  . Parkinson's disease     Past Surgical History  Procedure Laterality Date  . Tubal ligation    . Nasal septum repair    . S/p ganglion cyst right wrist    . S/p bilat cts    . S/p right thumb trigger finger surgury    . S/p fibroid ablation    . Esophagogastroduodenoscopy  2008    Dr. Harriette Bouillon duodenal ulcer  . Colonoscopy w/ biopsies  2008    Dr. Collene Mares -negative random bxs  . Cataract extraction    . Shoulder surgery Right 2015  . Foot surgery Bilateral 2015  . Cardiac catheterization      Family History  Problem Relation Age of Onset  . Peripheral vascular disease Mother     with bilat amputations  . Heart attack Mother   . Hypertension Father   . Heart disease Father 45  . Hypertension Brother   . Heart attack Brother   . Hypertension Brother   . Hypertension Brother     Social History History  Substance Use Topics  . Smoking status: Never Smoker   . Smokeless tobacco: Never Used  . Alcohol Use: Yes     Comment: Occ    No Known Allergies  Current Outpatient Prescriptions  Medication Sig Dispense Refill  . aspirin 81 MG tablet Take 81 mg by mouth daily.      Marland Kitchen  atorvastatin (LIPITOR) 20 MG tablet TAKE 1 TABLET DAILY  90 tablet  2  . diltiazem (CARDIZEM CD) 120 MG 24 hr capsule Take 1 capsule (120 mg total) by mouth 2 (two) times daily.  180 capsule  3  . DULoxetine (CYMBALTA) 60 MG capsule Take 1 capsule (60 mg total) by mouth daily.  90 capsule  3  . hydrochlorothiazide (HYDRODIURIL) 25 MG tablet Take 1 tablet (25 mg total) by mouth daily.  90 tablet  4  . Inulin (FIBER CHOICE FRUITY BITES PO) Take 1-2 tablets by mouth daily.      . irbesartan (AVAPRO) 300 MG tablet Take 1 tablet (300 mg total) by mouth daily.  90 tablet  3  . metoprolol tartrate (LOPRESSOR) 25 MG tablet Take 1 tablet (25 mg total) by mouth 2 (two) times daily.  180 tablet  4  . Multiple Vitamins-Calcium (ONE-A-DAY WOMENS PO) Take 1 tablet by mouth daily.      . potassium chloride (K-DUR) 10 MEQ tablet Take 1 tablet (10 mEq total) by mouth daily.  90 tablet  3  . Pramipexole Dihydrochloride (MIRAPEX ER) 3 MG TB24 Take 3 mg by mouth daily.       Marland Kitchen Propylene Glycol (SYSTANE BALANCE) 0.6 % SOLN Place 1-2 drops into both eyes  daily as needed (dry eyes).      . pramipexole (MIRAPEX) 1 MG tablet Take 1 tablet (1 mg total) by mouth 3 (three) times daily.  270 tablet  3   No current facility-administered medications for this visit.    Review of Systems Review of Systems Constitutional: positive for fatigue and weight gain Respiratory: negative for cough and wheezing Cardiovascular: negative for chest pain, fatigue and palpitations Gastrointestinal: negative for abdominal pain and change in bowel habits Genitourinary:positive for pain with intercourse Integument/breast: negative for nipple discharge Musculoskeletal:negative for myalgias Neurological: negative for gait problems and tremors Behavioral/Psych: negative for abusive relationship, depression Endocrine: positive for temperature intolerance     Blood pressure 130/80, pulse 74, temperature 98.2 F (36.8 C), height 4' 9.5"  (1.461 m), weight 66.225 kg (146 lb).  Physical Exam Physical Exam General:   alert  Skin:   no rash or abnormalities  Lungs:   clear to auscultation bilaterally  Heart:   regular rate and rhythm, S1, S2 normal, no murmur, click, rub or gallop  Abdomen:  normal findings: no organomegaly, soft, non-tender and no hernia  Pelvis:  External genitalia: normal general appearance; atrophic changes Urinary system: urethral meatus normal and bladder without fullness, nontender Vaginal: normal without tenderness, induration or masses Cervix: normal appearance Adnexa: normal bimanual exam Uterus: anteverted and non-tender, normal size      Data Reviewed labs  Assessment    Multiple somatic complaints--?etiology; doubt menopausal hot flushes Vaginal changes secondary to hypo estrogenic state Decreased sexual desire in the setting of an SSRI    Plan    Orders Placed This Encounter  Procedures  . WET PREP BY MOLECULAR PROBE    Possible management options include: switching antidepressant, bupropion, phosphodiesterase-5 inhibitor, osphena, lubricants, moisturizers, sex therapist Follow up in a few weeks        JACKSON-MOORE,Teryn Gust A 11/12/2013, 2:39 PM

## 2013-11-13 LAB — WET PREP BY MOLECULAR PROBE
Candida species: NEGATIVE
Gardnerella vaginalis: NEGATIVE
Trichomonas vaginosis: NEGATIVE

## 2013-11-15 ENCOUNTER — Telehealth: Payer: Self-pay | Admitting: Internal Medicine

## 2013-11-15 NOTE — Telephone Encounter (Signed)
Called pt no answer LMOM RTC.../lmb 

## 2013-11-15 NOTE — Telephone Encounter (Signed)
Pt called in and have quite a few reactions to DULoxetine (CYMBALTA) 60 MG capsule [[id number]] and wants to know if Dr Jenny Reichmann thinks that she could come off of it?  She is requesting call back about her meds and reactions.    Thank you

## 2013-11-15 NOTE — Telephone Encounter (Signed)
Pt returned call back she stated that her gynecologait wanted her to f/u with Dr. Jenny Reichmann. ? If the cymbalta is making her fatigue. Also having swelling in her ankles. Made appt for this Thursday 11/18/13...Johny Chess

## 2013-11-18 ENCOUNTER — Encounter: Payer: Self-pay | Admitting: Internal Medicine

## 2013-11-18 ENCOUNTER — Ambulatory Visit (INDEPENDENT_AMBULATORY_CARE_PROVIDER_SITE_OTHER): Payer: Medicare Other | Admitting: Internal Medicine

## 2013-11-18 VITALS — BP 132/80 | HR 83 | Temp 98.7°F | Ht <= 58 in | Wt 145.8 lb

## 2013-11-18 DIAGNOSIS — R5383 Other fatigue: Secondary | ICD-10-CM | POA: Diagnosis not present

## 2013-11-18 DIAGNOSIS — I10 Essential (primary) hypertension: Secondary | ICD-10-CM

## 2013-11-18 DIAGNOSIS — R7302 Impaired glucose tolerance (oral): Secondary | ICD-10-CM

## 2013-11-18 DIAGNOSIS — N951 Menopausal and female climacteric states: Secondary | ICD-10-CM

## 2013-11-18 DIAGNOSIS — R0609 Other forms of dyspnea: Secondary | ICD-10-CM | POA: Insufficient documentation

## 2013-11-18 DIAGNOSIS — F411 Generalized anxiety disorder: Secondary | ICD-10-CM | POA: Diagnosis not present

## 2013-11-18 DIAGNOSIS — Z23 Encounter for immunization: Secondary | ICD-10-CM | POA: Diagnosis not present

## 2013-11-18 DIAGNOSIS — R232 Flushing: Secondary | ICD-10-CM | POA: Insufficient documentation

## 2013-11-18 MED ORDER — DULOXETINE HCL 30 MG PO CPEP: 30.0000 mg | ORAL_CAPSULE | Freq: Every day | ORAL | Status: DC

## 2013-11-18 MED ORDER — IRBESARTAN 150 MG PO TABS: 150.0000 mg | ORAL_TABLET | Freq: Every day | ORAL | Status: DC

## 2013-11-18 NOTE — Progress Notes (Signed)
Pre visit review using our clinic review tool, if applicable. No additional management support is needed unless otherwise documented below in the visit note. 

## 2013-11-18 NOTE — Patient Instructions (Addendum)
OK to reduce the avapro to 150 mg per day  OK to reduce the cymbalta to 30 mg per day for 1 month, then stop  We could go back to cymbalta 60 mg if you have worsening depression, or stop the cymbalta and try lexapro 10 mg if needed  Please continue to monitor your blood pressure on a regular basis at home; your goal is to be less than 140/90  Please continue all other medications as before, and refills have been done if requested.  Please have the pharmacy call with any other refills you may need.  Please keep your appointments with your specialists as you may have planned  You will be contacted regarding the referral for: PFT's, and endocrinology  Please go to the LAB in the Basement (turn left off the elevator) for the tests to be done at your convenience  You will be contacted by phone if any changes need to be made immediately.  Otherwise, you will receive a letter about your results with an explanation, but please check with MyChart first.  Please remember to sign up for MyChart if you have not done so, as this will be important to you in the future with finding out test results, communicating by private email, and scheduling acute appointments online when needed.  Please return in 6 months, or sooner if needed

## 2013-11-18 NOTE — Progress Notes (Signed)
Subjective:    Patient ID: Destiny Roth, female    DOB: Oct 24, 1950, 63 y.o.   MRN: [id number]  HPI  Here with husband to f/u;  C/o general fatigue and dizziness, BP lower at home recently.  Wondering about the BP med, and cymbalta.  Has ongoing DOE, recent card eval neg, has appt with pulm soon.  Mentions tendency for fluid retention and hard to get rings off.  Also with recurring hot flashes -  GYN suggested to pt her hot flashes were possible other hormonal rather estrogen related.  Denies worsening depressive symptoms, suicidal ideation, or panic but more stress recently.   Past Medical History  Diagnosis Date  . GLUCOSE INTOLERANCE 05/24/2008  . HYPERLIPIDEMIA 09/23/2006  . ANXIETY 09/23/2006  . DEPRESSION 09/23/2006  . CARPAL TUNNEL SYNDROME, BILATERAL 09/23/2006  . HYPERTENSION 09/23/2006  . GERD 05/24/2008  . DIVERTICULOSIS, COLON 09/23/2006  . IBS 09/23/2006  . BUNION, RIGHT FOOT 09/18/2009  . OSTEOPOROSIS 05/24/2008  . Abdominal pain, epigastric 09/18/2009  . Impaired glucose tolerance 11/04/2010  . Anosmia 11/06/2010  . Parkinson's disease    Past Surgical History  Procedure Laterality Date  . Tubal ligation    . Nasal septum repair    . S/p ganglion cyst right wrist    . S/p bilat cts    . S/p right thumb trigger finger surgury    . S/p fibroid ablation    . Esophagogastroduodenoscopy  2008    Dr. Harriette Bouillon duodenal ulcer  . Colonoscopy w/ biopsies  2008    Dr. Collene Mares -negative random bxs  . Cataract extraction    . Shoulder surgery Right 2015  . Foot surgery Bilateral 2015  . Cardiac catheterization      reports that she has never smoked. She has never used smokeless tobacco. She reports that she drinks alcohol. She reports that she does not use illicit drugs. family history includes Heart attack in her brother and mother; Heart disease (age of onset: 18) in her father; Hypertension in her brother, brother, brother, and father; Peripheral vascular disease in her  mother. No Known Allergies Current Outpatient Prescriptions on File Prior to Visit  Medication Sig Dispense Refill  . aspirin 81 MG tablet Take 81 mg by mouth daily.      Marland Kitchen atorvastatin (LIPITOR) 20 MG tablet TAKE 1 TABLET DAILY  90 tablet  2  . diltiazem (CARDIZEM CD) 120 MG 24 hr capsule Take 1 capsule (120 mg total) by mouth 2 (two) times daily.  180 capsule  3  . hydrochlorothiazide (HYDRODIURIL) 25 MG tablet Take 1 tablet (25 mg total) by mouth daily.  90 tablet  4  . Inulin (FIBER CHOICE FRUITY BITES PO) Take 1-2 tablets by mouth daily.      . metoprolol tartrate (LOPRESSOR) 25 MG tablet Take 1 tablet (25 mg total) by mouth 2 (two) times daily.  180 tablet  4  . Multiple Vitamins-Calcium (ONE-A-DAY WOMENS PO) Take 1 tablet by mouth daily.      . potassium chloride (K-DUR) 10 MEQ tablet Take 1 tablet (10 mEq total) by mouth daily.  90 tablet  3  . pramipexole (MIRAPEX) 1 MG tablet Take 1 tablet (1 mg total) by mouth 3 (three) times daily.  270 tablet  3  . Pramipexole Dihydrochloride (MIRAPEX ER) 3 MG TB24 Take 3 mg by mouth daily.       Marland Kitchen Propylene Glycol (SYSTANE BALANCE) 0.6 % SOLN Place 1-2 drops into both eyes daily as needed (dry  eyes).       No current facility-administered medications on file prior to visit.    Review of Systems  Constitutional: Negative for unusual diaphoresis or other sweats  HENT: Negative for ringing in ear Eyes: Negative for double vision or worsening visual disturbance.  Respiratory: Negative for choking and stridor.   Gastrointestinal: Negative for vomiting or other signifcant bowel change Genitourinary: Negative for hematuria or decreased urine volume.  Musculoskeletal: Negative for other MSK pain or swelling Skin: Negative for color change and worsening wound.  Neurological: Negative for tremors and numbness other than noted  Psychiatric/Behavioral: Negative for decreased concentration or agitation other than above       Objective:   Physical  Exam BP 132/80  Pulse 83  Temp(Src) 98.7 F (37.1 C) (Oral)  Ht 4' 9.5" (1.461 m)  Wt 145 lb 12 oz (66.112 kg)  BMI 30.97 kg/m2  SpO2 97% Ambulatory o2 sat :  93% at 100 ft VS noted,  Constitutional: Pt appears well-developed, well-nourished.  HENT: Head: NCAT.  Right Ear: External ear normal.  Left Ear: External ear normal.  Eyes: . Pupils are equal, round, and reactive to light. Conjunctivae and EOM are normal Neck: Normal range of motion. Neck supple.  Cardiovascular: Normal rate and regular rhythm.   Pulmonary/Chest: Effort normal and breath sounds normal.  Abd:  Soft, NT, ND, + BS Neurological: Pt is alert. Not confused , motor grossly intact Skin: Skin is warm. No rash Psychiatric: Pt behavior is normal. 1-2 + nervous/tense demeanor    Assessment & Plan:

## 2013-11-19 ENCOUNTER — Telehealth: Payer: Self-pay | Admitting: Internal Medicine

## 2013-11-19 ENCOUNTER — Other Ambulatory Visit (INDEPENDENT_AMBULATORY_CARE_PROVIDER_SITE_OTHER): Payer: Medicare Other

## 2013-11-19 ENCOUNTER — Telehealth: Payer: Self-pay

## 2013-11-19 DIAGNOSIS — I10 Essential (primary) hypertension: Secondary | ICD-10-CM

## 2013-11-19 DIAGNOSIS — R0609 Other forms of dyspnea: Secondary | ICD-10-CM | POA: Diagnosis not present

## 2013-11-19 DIAGNOSIS — Z23 Encounter for immunization: Secondary | ICD-10-CM | POA: Diagnosis not present

## 2013-11-19 DIAGNOSIS — R7302 Impaired glucose tolerance (oral): Secondary | ICD-10-CM

## 2013-11-19 DIAGNOSIS — R232 Flushing: Secondary | ICD-10-CM

## 2013-11-19 DIAGNOSIS — N951 Menopausal and female climacteric states: Secondary | ICD-10-CM

## 2013-11-19 LAB — CORTISOL: Cortisol, Plasma: 9.8 ug/dL

## 2013-11-19 LAB — TSH: TSH: 1.65 u[IU]/mL (ref 0.35–4.50)

## 2013-11-19 LAB — T4, FREE: Free T4: 0.96 ng/dL (ref 0.60–1.60)

## 2013-11-19 NOTE — Telephone Encounter (Signed)
emmi emailed °

## 2013-11-19 NOTE — Telephone Encounter (Signed)
Patient informed of MD instructions. 

## 2013-11-19 NOTE — Telephone Encounter (Signed)
Message copied by Jamesetta Orleans on Fri Nov 19, 2013  8:46 AM ------      Message from: Biagio Borg      Created: Thu Nov 18, 2013  7:02 PM       All medications are needed for BP as prescribed.  OK to keep appt for CPX as today was not a physical.  thanks      ----- Message -----         From: Donell Sievert Ewing         Sent: 11/18/2013   3:05 PM           To: Biagio Borg, MD            The patient and her husband would like to know why she is taking 4 medications for her BP and is it really necessary.  Also she had a physical already scheduled for December which she did keep.  Let me know if not needed due to today's visit.       ------

## 2013-11-21 NOTE — Assessment & Plan Note (Addendum)
Etiology unclear, Also for PFT's, f/u with pulm as planned  Note:  Total time for pt hx, exam, review of record with pt in the room, determination of diagnoses and plan for further eval and tx is > 40 min, with over 50% spent in coordination and counseling of patient

## 2013-11-21 NOTE — Assessment & Plan Note (Signed)
stable overall by history and exam but cant r/o high dose avapro causing fatigue/dizzy/doe - for reduced avapro to 150 mg., recent data reviewed with pt, and pt to otherwise continue medical treatment as before,  to f/u any worsening symptoms or concerns BP Readings from Last 3 Encounters:  11/18/13 132/80  11/11/13 130/80  11/09/13 124/95

## 2013-11-21 NOTE — Assessment & Plan Note (Signed)
stable overall by history and exam, recent data reviewed with pt, and pt to continue medical treatment as before,  to f/u any worsening symptoms or concerns Lab Results  Component Value Date   HGBA1C 5.4 10/08/2013

## 2013-11-21 NOTE — Assessment & Plan Note (Signed)
Not estrogen related per pt per GYN, for cortisol/tsh/free tr levels as well

## 2013-11-21 NOTE — Assessment & Plan Note (Signed)
Worse situational it seems, to consider d/c cymbalta as well if symptoms not improved with reducing avapro

## 2013-11-29 ENCOUNTER — Encounter: Payer: Self-pay | Admitting: Internal Medicine

## 2013-12-08 ENCOUNTER — Ambulatory Visit: Payer: Medicare Other | Admitting: Pulmonary Disease

## 2013-12-09 ENCOUNTER — Encounter: Payer: Self-pay | Admitting: Obstetrics & Gynecology

## 2013-12-09 ENCOUNTER — Ambulatory Visit: Payer: No Typology Code available for payment source | Admitting: Obstetrics & Gynecology

## 2013-12-14 ENCOUNTER — Ambulatory Visit: Payer: Medicare Other | Admitting: Pulmonary Disease

## 2013-12-15 ENCOUNTER — Telehealth: Payer: Self-pay | Admitting: *Deleted

## 2013-12-15 NOTE — Telephone Encounter (Signed)
Patient states she was expecting Dr Delsa Sale to refer her to an endocrinologist for hormonal imbalance.

## 2013-12-15 NOTE — Telephone Encounter (Signed)
Offered referral to a sex therapist

## 2013-12-16 ENCOUNTER — Encounter: Payer: Medicare Other | Admitting: Internal Medicine

## 2013-12-16 NOTE — Telephone Encounter (Signed)
LM on VM to CB regarding type of referral Dr Charlynn Grimes wanted to send her to.

## 2013-12-17 ENCOUNTER — Ambulatory Visit (INDEPENDENT_AMBULATORY_CARE_PROVIDER_SITE_OTHER): Payer: Medicare Other | Admitting: Internal Medicine

## 2013-12-17 ENCOUNTER — Telehealth: Payer: Self-pay | Admitting: *Deleted

## 2013-12-17 DIAGNOSIS — R0609 Other forms of dyspnea: Secondary | ICD-10-CM | POA: Diagnosis not present

## 2013-12-17 LAB — PULMONARY FUNCTION TEST
DL/VA % pred: 88 %
DL/VA: 3.69 ml/min/mmHg/L
DLCO UNC % PRED: 75 %
DLCO UNC: 13.75 ml/min/mmHg
FEF 25-75 PRE: 1.54 L/s
FEF 25-75 Post: 1.77 L/sec
FEF2575-%CHANGE-POST: 14 %
FEF2575-%Pred-Post: 94 %
FEF2575-%Pred-Pre: 82 %
FEV1-%CHANGE-POST: 1 %
FEV1-%Pred-Post: 93 %
FEV1-%Pred-Pre: 92 %
FEV1-PRE: 1.84 L
FEV1-Post: 1.88 L
FEV1FVC-%Change-Post: 6 %
FEV1FVC-%PRED-PRE: 101 %
FEV6-%Change-Post: -4 %
FEV6-%PRED-POST: 89 %
FEV6-%PRED-PRE: 94 %
FEV6-POST: 2.26 L
FEV6-Pre: 2.37 L
FEV6FVC-%PRED-POST: 104 %
FEV6FVC-%Pred-Pre: 104 %
FVC-%Change-Post: -4 %
FVC-%Pred-Post: 86 %
FVC-%Pred-Pre: 90 %
FVC-POST: 2.26 L
FVC-PRE: 2.37 L
POST FEV6/FVC RATIO: 100 %
PRE FEV6/FVC RATIO: 100 %
Post FEV1/FVC ratio: 83 %
Pre FEV1/FVC ratio: 78 %
RV % pred: -2 %
RV: -0.04 L
TLC % PRED: 70 %
TLC: 3.09 L

## 2013-12-17 NOTE — Telephone Encounter (Signed)
Labs appear normal

## 2013-12-17 NOTE — Telephone Encounter (Signed)
Pt placed call to office regarding referral that was discussed at last appt.   Return call to pt.  Pt states that she was discussing her symptoms, hot flashes, with Dr Delsa Sale. Pt states that Dr Delsa Sale thinks this may be related to her cortisol level and she may need to see endocrinologist.  Pt states she would like referral if possible. Pt states that she also has appt today with pulmonologist for SOB and other symptoms.  Pt was advised that we could refer to sex therapy for decrease libido.  Pt does not want this referral at this time.  In review of chart, pt has recently been seen at her [place] office, Dr Judi Cong, and labs were drawn.  Please review these labs and make any further recommendations that we may give pt.  Please advise.

## 2013-12-17 NOTE — Progress Notes (Signed)
PFT done today. 

## 2013-12-20 ENCOUNTER — Encounter: Payer: Self-pay | Admitting: Internal Medicine

## 2013-12-20 DIAGNOSIS — J984 Other disorders of lung: Secondary | ICD-10-CM | POA: Insufficient documentation

## 2013-12-20 HISTORY — DX: Other disorders of lung: J98.4

## 2013-12-21 NOTE — Telephone Encounter (Signed)
Left message for patient that all labs appear normal and that we can do that alternative referral to therapist if she changed her mind.

## 2013-12-21 NOTE — Telephone Encounter (Signed)
See other call note

## 2013-12-28 ENCOUNTER — Other Ambulatory Visit: Payer: Self-pay | Admitting: *Deleted

## 2013-12-28 DIAGNOSIS — R5383 Other fatigue: Secondary | ICD-10-CM

## 2013-12-29 ENCOUNTER — Telehealth: Payer: Self-pay

## 2013-12-29 NOTE — Telephone Encounter (Signed)
poke with [facility] - they will not see patient for fatigue, or hot flashes, need diagnosis regarding thyroid - she is seeing PCP Dr. Jeneen Rinks on 12/9 - explained to patient to have labs done

## 2013-12-31 ENCOUNTER — Other Ambulatory Visit (INDEPENDENT_AMBULATORY_CARE_PROVIDER_SITE_OTHER): Payer: Medicare Other

## 2013-12-31 ENCOUNTER — Telehealth: Payer: Self-pay | Admitting: Internal Medicine

## 2013-12-31 DIAGNOSIS — I10 Essential (primary) hypertension: Secondary | ICD-10-CM

## 2013-12-31 DIAGNOSIS — Z0000 Encounter for general adult medical examination without abnormal findings: Secondary | ICD-10-CM

## 2013-12-31 DIAGNOSIS — E785 Hyperlipidemia, unspecified: Secondary | ICD-10-CM | POA: Diagnosis not present

## 2014-01-01 LAB — CBC WITH DIFFERENTIAL/PLATELET
BASOS PCT: 0.3 % (ref 0.0–3.0)
Basophils Absolute: 0 10*3/uL (ref 0.0–0.1)
EOS PCT: 1.1 % (ref 0.0–5.0)
Eosinophils Absolute: 0.1 10*3/uL (ref 0.0–0.7)
HCT: 45.7 % (ref 36.0–46.0)
Hemoglobin: 15 g/dL (ref 12.0–15.0)
LYMPHS PCT: 13.7 % (ref 12.0–46.0)
Lymphs Abs: 1.2 10*3/uL (ref 0.7–4.0)
MCHC: 32.8 g/dL (ref 30.0–36.0)
MCV: 90.9 fl (ref 78.0–100.0)
Monocytes Absolute: 0.6 10*3/uL (ref 0.1–1.0)
Monocytes Relative: 6.3 % (ref 3.0–12.0)
NEUTROS PCT: 78.6 % — AB (ref 43.0–77.0)
Neutro Abs: 7.2 10*3/uL (ref 1.4–7.7)
Platelets: 299 10*3/uL (ref 150.0–400.0)
RBC: 5.03 Mil/uL (ref 3.87–5.11)
RDW: 12.5 % (ref 11.5–15.5)
WBC: 9.1 10*3/uL (ref 4.0–10.5)

## 2014-01-01 LAB — TSH: TSH: 1.13 u[IU]/mL (ref 0.35–4.50)

## 2014-01-02 LAB — LIPID PANEL
CHOL/HDL RATIO: 4
Cholesterol: 189 mg/dL (ref 0–200)
HDL: 45 mg/dL (ref >39.00)
NonHDL: 144
TRIGLYCERIDES: 292 mg/dL — AB (ref 0.0–149.0)
VLDL: 58.4 mg/dL — ABNORMAL HIGH (ref 0.0–40.0)

## 2014-01-02 LAB — BASIC METABOLIC PANEL
BUN: 18 mg/dL (ref 6–23)
CHLORIDE: 98 meq/L (ref 96–112)
CO2: 26 mEq/L (ref 19–32)
Calcium: 9.8 mg/dL (ref 8.4–10.5)
Creatinine, Ser: 0.8 mg/dL (ref 0.4–1.2)
GFR: 75.36 mL/min (ref >60.00)
GLUCOSE: 79 mg/dL (ref 70–99)
POTASSIUM: 3.6 meq/L (ref 3.5–5.1)
Sodium: 137 mEq/L (ref 135–145)

## 2014-01-02 LAB — HEPATIC FUNCTION PANEL
ALBUMIN: 4.1 g/dL (ref 3.5–5.2)
ALT: 41 U/L — ABNORMAL HIGH (ref 0–35)
AST: 40 U/L — AB (ref 0–37)
Alkaline Phosphatase: 75 U/L (ref 39–117)
Bilirubin, Direct: 0.1 mg/dL (ref 0.0–0.3)
Total Bilirubin: 0.6 mg/dL (ref 0.2–1.2)
Total Protein: 7.5 g/dL (ref 6.0–8.3)

## 2014-01-02 LAB — URINALYSIS, ROUTINE W REFLEX MICROSCOPIC
Bilirubin Urine: NEGATIVE
KETONES UR: NEGATIVE
LEUKOCYTES UA: NEGATIVE
Nitrite: NEGATIVE
Specific Gravity, Urine: >1.03 — AB (ref 1.000–1.030)
Urine Glucose: NEGATIVE
WBC, UA: NONE SEEN (ref 0–?)
pH: 5.5 (ref 5.0–8.0)

## 2014-01-02 LAB — LDL CHOLESTEROL, DIRECT: Direct LDL: 89.3 mg/dL

## 2014-01-05 ENCOUNTER — Ambulatory Visit (INDEPENDENT_AMBULATORY_CARE_PROVIDER_SITE_OTHER): Payer: Medicare Other | Admitting: Internal Medicine

## 2014-01-05 ENCOUNTER — Encounter: Payer: Self-pay | Admitting: Internal Medicine

## 2014-01-05 VITALS — BP 140/84 | HR 81 | Temp 98.1°F | Ht <= 58 in | Wt 145.0 lb

## 2014-01-05 DIAGNOSIS — Z23 Encounter for immunization: Secondary | ICD-10-CM | POA: Diagnosis not present

## 2014-01-05 DIAGNOSIS — I10 Essential (primary) hypertension: Secondary | ICD-10-CM

## 2014-01-05 DIAGNOSIS — R0600 Dyspnea, unspecified: Secondary | ICD-10-CM

## 2014-01-05 DIAGNOSIS — E785 Hyperlipidemia, unspecified: Secondary | ICD-10-CM | POA: Diagnosis not present

## 2014-01-05 DIAGNOSIS — N951 Menopausal and female climacteric states: Secondary | ICD-10-CM

## 2014-01-05 DIAGNOSIS — R232 Flushing: Secondary | ICD-10-CM

## 2014-01-05 DIAGNOSIS — R7302 Impaired glucose tolerance (oral): Secondary | ICD-10-CM

## 2014-01-05 DIAGNOSIS — J984 Other disorders of lung: Secondary | ICD-10-CM | POA: Diagnosis not present

## 2014-01-05 MED ORDER — ALBUTEROL SULFATE HFA 108 (90 BASE) MCG/ACT IN AERS: 2.0000 | INHALATION_SPRAY | Freq: Four times a day (QID) | RESPIRATORY_TRACT | Status: DC | PRN

## 2014-01-05 NOTE — Progress Notes (Signed)
Pre visit review using our clinic review tool, if applicable. No additional management support is needed unless otherwise documented below in the visit note. 

## 2014-01-05 NOTE — Progress Notes (Signed)
Subjective:    Patient ID: Destiny Roth, female    DOB: 19-Jun-1950, 63 y.o.   MRN: [id number]  HPI  Here for yearly f/u;  Overall doing ok;  Pt denies CP, worsening SOB, DOE, wheezing, orthopnea, PND, worsening LE edema, palpitations, dizziness or syncope.  Pt denies neurological change such as new headache, facial or extremity weakness.  Pt denies polydipsia, polyuria, or low sugar symptoms. Pt states overall good compliance with treatment and medications, good tolerability, and has been trying to follow lower cholesterol diet.  Pt denies worsening depressive symptoms, suicidal ideation or panic. No fever, night sweats, wt loss, loss of appetite, or other constitutional symptoms.  Pt states good ability with ADL's, has low fall risk, home safety reviewed and adequate, no other significant changes in hearing or vision, and only occasionally active with exercise. Taking the statin onoy half per day to reduce risk of myalgias.   Past Medical History  Diagnosis Date  . GLUCOSE INTOLERANCE 05/24/2008  . HYPERLIPIDEMIA 09/23/2006  . ANXIETY 09/23/2006  . DEPRESSION 09/23/2006  . CARPAL TUNNEL SYNDROME, BILATERAL 09/23/2006  . HYPERTENSION 09/23/2006  . GERD 05/24/2008  . DIVERTICULOSIS, COLON 09/23/2006  . IBS 09/23/2006  . BUNION, RIGHT FOOT 09/18/2009  . OSTEOPOROSIS 05/24/2008  . Abdominal pain, epigastric 09/18/2009  . Impaired glucose tolerance 11/04/2010  . Anosmia 11/06/2010  . Parkinson's disease   . Restrictive lung disease 12/20/2013   Past Surgical History  Procedure Laterality Date  . Tubal ligation    . Nasal septum repair    . S/p ganglion cyst right wrist    . S/p bilat cts    . S/p right thumb trigger finger surgury    . S/p fibroid ablation    . Esophagogastroduodenoscopy  2008    Dr. Harriette Bouillon duodenal ulcer  . Colonoscopy w/ biopsies  2008    Dr. Collene Mares -negative random bxs  . Cataract extraction    . Shoulder surgery Right 2015  . Foot surgery Bilateral 2015  . Cardiac  catheterization      reports that she has never smoked. She has never used smokeless tobacco. She reports that she drinks alcohol. She reports that she does not use illicit drugs. family history includes Heart attack in her brother and mother; Heart disease (age of onset: 74) in her father; Hypertension in her brother, brother, brother, and father; Peripheral vascular disease in her mother. No Known Allergies Current Outpatient Prescriptions on File Prior to Visit  Medication Sig Dispense Refill  . aspirin 81 MG tablet Take 81 mg by mouth daily.    Marland Kitchen atorvastatin (LIPITOR) 20 MG tablet TAKE 1 TABLET DAILY 90 tablet 2  . diltiazem (CARDIZEM CD) 120 MG 24 hr capsule Take 1 capsule (120 mg total) by mouth 2 (two) times daily. 180 capsule 3  . hydrochlorothiazide (HYDRODIURIL) 25 MG tablet Take 1 tablet (25 mg total) by mouth daily. 90 tablet 4  . irbesartan (AVAPRO) 150 MG tablet Take 1 tablet (150 mg total) by mouth daily. 90 tablet 3  . metoprolol tartrate (LOPRESSOR) 25 MG tablet Take 1 tablet (25 mg total) by mouth 2 (two) times daily. 180 tablet 4  . Multiple Vitamins-Calcium (ONE-A-DAY WOMENS PO) Take 1 tablet by mouth daily.    . potassium chloride (K-DUR) 10 MEQ tablet Take 1 tablet (10 mEq total) by mouth daily. 90 tablet 3  . pramipexole (MIRAPEX) 1 MG tablet Take 1 tablet (1 mg total) by mouth 3 (three) times daily. 270 tablet 3  .  Pramipexole Dihydrochloride (MIRAPEX ER) 3 MG TB24 Take 3 mg by mouth daily.      No current facility-administered medications on file prior to visit.    Bp at home < 140/90 Review of Systems Constitutional: Negative for increased diaphoresis, other activity, appetite or other siginficant weight change  HENT: Negative for worsening hearing loss, ear pain, facial swelling, mouth sores and neck stiffness.   Eyes: Negative for other worsening pain, redness or visual disturbance.  Respiratory: Negative for shortness of breath and wheezing.   Cardiovascular:  Negative for chest pain and palpitations.  Gastrointestinal: Negative for diarrhea, blood in stool, abdominal distention or other pain Genitourinary: Negative for hematuria, flank pain or change in urine volume.  Musculoskeletal: Negative for myalgias or other joint complaints.  Skin: Negative for color change and wound.  Neurological: Negative for syncope and numbness. other than noted Hematological: Negative for adenopathy. or other swelling Psychiatric/Behavioral: Negative for hallucinations, self-injury, decreased concentration or other worsening agitation.     Objective:   Physical Exam BP 140/84 mmHg  Pulse 81  Temp(Src) 98.1 F (36.7 C)  Ht 4' 9.5" (1.461 m)  Wt 145 lb (65.772 kg)  BMI 30.81 kg/m2  SpO2 97% VS noted,  Constitutional: Pt is oriented to person, place, and time. Appears well-developed and well-nourished.  Head: Normocephalic and atraumatic.  Right Ear: External ear normal.  Left Ear: External ear normal.  Nose: Nose normal.  Mouth/Throat: Oropharynx is clear and moist.  Eyes: Conjunctivae and EOM are normal. Pupils are equal, round, and reactive to light.  Neck: Normal range of motion. Neck supple. No JVD present. No tracheal deviation present.  Cardiovascular: Normal rate, regular rhythm, normal heart sounds and intact distal pulses.   Pulmonary/Chest: Effort normal and breath sounds without rales or wheezing  Abdominal: Soft. Bowel sounds are normal. NT. No HSM  Musculoskeletal: Normal range of motion. Exhibits no edema.  Lymphadenopathy:  Has no cervical adenopathy.  Neurological: Pt is alert and oriented to person, place, and time. Pt has normal reflexes. No cranial nerve deficit. Motor grossly intact Skin: Skin is warm and dry. No rash noted.  Psychiatric:  Has normal mood and affect. Behavior is normal.     Assessment & Plan:

## 2014-01-05 NOTE — Assessment & Plan Note (Addendum)
Will plan on f/u with Dr Elsworth Soho pulm for this instead of f/u sleep apnea if possible; for CT chest r/o parenchymal dz  Note:  Total time for pt hx, exam, review of record with pt in the room, determination of diagnoses and plan for further eval and tx is > 40 min, with over 50% spent in coordination and counseling of patient

## 2014-01-05 NOTE — Patient Instructions (Addendum)
You had the Pneumovax today  Please continue all other medications as before, and refills have been done if requested.  Please have the pharmacy call with any other refills you may need.  Please continue your efforts at being more active, low cholesterol diet, and weight control.  You are otherwise up to date with prevention measures today.  Please keep your appointments with your specialists as you may have planned  You will be contacted regarding the referral for: CT chest, and Pulmonary (to see [facility] today)  Please return in 6 months, or sooner if needed

## 2014-01-06 ENCOUNTER — Ambulatory Visit (INDEPENDENT_AMBULATORY_CARE_PROVIDER_SITE_OTHER): Payer: Medicare Other | Admitting: Podiatry

## 2014-01-06 ENCOUNTER — Ambulatory Visit (INDEPENDENT_AMBULATORY_CARE_PROVIDER_SITE_OTHER): Payer: Medicare Other

## 2014-01-06 ENCOUNTER — Encounter: Payer: Self-pay | Admitting: Podiatry

## 2014-01-06 ENCOUNTER — Ambulatory Visit: Payer: Self-pay

## 2014-01-06 VITALS — BP 122/66 | HR 65 | Resp 16

## 2014-01-06 DIAGNOSIS — Z9889 Other specified postprocedural states: Secondary | ICD-10-CM

## 2014-01-06 DIAGNOSIS — M2010 Hallux valgus (acquired), unspecified foot: Secondary | ICD-10-CM | POA: Diagnosis not present

## 2014-01-06 MED ORDER — DICLOFENAC SODIUM 75 MG PO TBEC: 75.0000 mg | DELAYED_RELEASE_TABLET | Freq: Two times a day (BID) | ORAL | Status: DC

## 2014-01-07 ENCOUNTER — Ambulatory Visit (INDEPENDENT_AMBULATORY_CARE_PROVIDER_SITE_OTHER)
Admission: RE | Admit: 2014-01-07 | Discharge: 2014-01-07 | Disposition: A | Discharge status: Home / Self Care | Payer: Medicare Other | Source: Ambulatory Visit | Attending: Cardiovascular Disease | Admitting: Cardiovascular Disease

## 2014-01-07 DIAGNOSIS — R590 Localized enlarged lymph nodes: Secondary | ICD-10-CM | POA: Diagnosis not present

## 2014-01-07 DIAGNOSIS — J984 Other disorders of lung: Secondary | ICD-10-CM | POA: Diagnosis not present

## 2014-01-07 DIAGNOSIS — R0600 Dyspnea, unspecified: Secondary | ICD-10-CM

## 2014-01-07 DIAGNOSIS — R0602 Shortness of breath: Secondary | ICD-10-CM | POA: Diagnosis not present

## 2014-01-08 NOTE — Progress Notes (Signed)
Subjective:     Patient ID: Destiny Roth, female   DOB: January 14, 1951, 63 y.o.   MRN: [id number]  HPI patient presents stating I gets some pain in my forefoot bilateral and also my heels. States the left bothers her more than the right and she still gets mild swelling around the big toe joints but those seem to be healing   Review of Systems     Objective:   Physical Exam Neurovascular status intact with muscle strength adequate and range of motion within normal limits. I noted mild discomfort in the lesser metatarsal phalangeal joints that's diffuse in nature and mild heel pain with no intense discomfort upon palpation. Range of motion of the first MPJ is adequate bilateral with very minimal crepitus noted on the right where she had had a previous fracture in the first metatarsal head    Assessment:     Low-grade inflammatory process with a previous fracture first metatarsal head right which is caused some shortening of the bone and possible osteoarthritis which does not appear to be clinically causing her pain currently    Plan:     Reviewed condition and continued healing from surgery. At this time I want her to take diclofenac 75 mg twice a day to try to reduce inflammation utilized soaks and supportive shoe gear. I'm hoping is inflammation will gradually reduce but we are getting keep an eye on her and I'm going to review her condition in the next several months. Reviewed x-rays and the healed fracture of the first metatarsal right with shortening of the bone and ridging which may indicate some osteoarthritic changes

## 2014-01-09 NOTE — Assessment & Plan Note (Signed)
stable overall by history and exam, recent data reviewed with pt, and pt to continue medical treatment as before,  to f/u any worsening symptoms or concerns BP Readings from Last 3 Encounters:  01/06/14 122/66  01/05/14 140/84  11/18/13 132/80

## 2014-01-09 NOTE — Assessment & Plan Note (Signed)
stable overall by history and exam, recent data reviewed with pt, and pt to continue medical treatment as before,  to f/u any worsening symptoms or concerns Lab Results  Component Value Date   LDLCALC 76 12/29/2012

## 2014-01-09 NOTE — Assessment & Plan Note (Signed)
For speciality referral per pt request

## 2014-01-09 NOTE — Assessment & Plan Note (Signed)
stable overall by history and exam, recent data reviewed with pt, and pt to continue medical treatment as before,  to f/u any worsening symptoms or concerns Lab Results  Component Value Date   HGBA1C 5.4 10/08/2013

## 2014-01-12 ENCOUNTER — Other Ambulatory Visit: Payer: Self-pay | Admitting: Internal Medicine

## 2014-01-12 ENCOUNTER — Encounter: Payer: Self-pay | Admitting: Pulmonary Disease

## 2014-01-12 ENCOUNTER — Ambulatory Visit (INDEPENDENT_AMBULATORY_CARE_PROVIDER_SITE_OTHER): Payer: Medicare Other | Admitting: Pulmonary Disease

## 2014-01-12 ENCOUNTER — Other Ambulatory Visit: Payer: Medicare Other

## 2014-01-12 VITALS — BP 126/82 | HR 88 | Ht <= 58 in | Wt 147.4 lb

## 2014-01-12 DIAGNOSIS — R0602 Shortness of breath: Secondary | ICD-10-CM | POA: Diagnosis not present

## 2014-01-12 DIAGNOSIS — G4733 Obstructive sleep apnea (adult) (pediatric): Secondary | ICD-10-CM

## 2014-01-12 DIAGNOSIS — J984 Other disorders of lung: Secondary | ICD-10-CM | POA: Diagnosis not present

## 2014-01-12 NOTE — Assessment & Plan Note (Signed)
I once again offered her C Pap She would like to defer this for now

## 2014-01-12 NOTE — Progress Notes (Signed)
   Subjective:    Patient ID: Destiny Roth, female    DOB: April 09, 1950, 63 y.o.   MRN: [id number]  HPI  63 year old woman for evaluation of dyspnea and mild OSA She was diagnosed with Parkinson's 2013 and has been maintained on Mirapex . Leg jerks have improved after increasing dose of Mirapex. She has a history of  vivid dreams and screaming and talking during sleep.    01/12/2014  Chief Complaint  Patient presents with  . Follow-up    Discuss lab results; PFT results; CT results    Accompanied by husband and son-in-law She reports significant dyspnea worsening over the past year, now to class II symptoms. This was apparent on a trip to [place] and she had to walk a lot.  On last visit CPAP was deferred Tachycardia was evaluated by cards Harrington Challenger)  Underwent foot surgery -bunionectomy in January 2015 & rt shoulder injection She is worried that her medications may be causing symptoms. She would like to know what is meant by restrictive lung disease She does not desaturate on exertion Significant tests/ events   - echo nml Cath - non obstructive CAD, normal LV fn CT chest without contrast nad PFTs no airway obstruction, FVC 90%, TLC decreased at 70%, DLCO normal Labs-normal renal function, ABG does not show hypercarbia, PO2 71 on room air  PSG 2015 showed mild OSA - AHI 12/h- No evidence of increased muscle tone during REM sleep, REM AHI was 36/h. There were 58 RERAs with an RDI of 23 events per hour  Review of Systems neg for any significant sore throat, dysphagia, itching, sneezing, nasal congestion or excess/ purulent secretions, fever, chills, sweats, unintended wt loss, pleuritic or exertional cp, hempoptysis, orthopnea pnd or change in chronic leg swelling. Also denies presyncope, palpitations, heartburn, abdominal pain, nausea, vomiting, diarrhea or change in bowel or urinary habits, dysuria,hematuria, rash, arthralgias, visual complaints, headache, numbness weakness or  ataxia.     Objective:   Physical Exam  Gen. Pleasant, well-nourished, in no distress, flat affect ENT - no lesions, no post nasal drip Neck: No JVD, no thyromegaly, no carotid bruits Lungs: no use of accessory muscles, no dullness to percussion, clear without rales or rhonchi  Cardiovascular: Rhythm regular, heart sounds  normal, no murmurs or gallops, no peripheral edema Abdomen: soft and non-tender, no hepatosplenomegaly, BS normal. Musculoskeletal: No deformities, no cyanosis or clubbing Neuro:  alert, non focal        Assessment & Plan:

## 2014-01-12 NOTE — Patient Instructions (Signed)
Ambulatory satn Blood work - d-dimer If negative, will proceed with pulmonary rehab program I will discuss with Dr Tat

## 2014-01-12 NOTE — Assessment & Plan Note (Signed)
I am not impressed by the degree of restriction, TLC was 70% but FVC was at 90%. If we do want to evaluate this further, we would have to look at neuromuscular cause and obtain inspiratory and expiratory pressures -I will defer this for now. Parkinson's by itself should not be causing her dyspnea I feel that it is more likely that her dyspnea is related to deconditioning. She does not desaturate on exertion We will check a d-dimer today, if positive she will need a CT angiogram to rule out pulmonary embolus as a cause of dyspnea Review of her medications does not reveal any obvious culprit. There is no evidence of pulmonary hypertension on echo or fibrosis on her imaging studies

## 2014-01-13 LAB — D-DIMER, QUANTITATIVE (NOT AT ARMC): D DIMER QUANT: 0.35 ug{FEU}/mL (ref 0.00–0.48)

## 2014-01-24 ENCOUNTER — Encounter: Payer: Self-pay | Admitting: *Deleted

## 2014-01-25 ENCOUNTER — Telehealth (HOSPITAL_COMMUNITY): Payer: Self-pay

## 2014-01-25 ENCOUNTER — Encounter: Payer: Self-pay | Admitting: Obstetrics & Gynecology

## 2014-01-25 NOTE — Telephone Encounter (Signed)
Called patient regarding entrance to Pulmonary Rehab.  Patient states that they are interested in attending the program.  [name] is going to verify insurance coverage and follow up.

## 2014-02-01 ENCOUNTER — Ambulatory Visit (INDEPENDENT_AMBULATORY_CARE_PROVIDER_SITE_OTHER): Payer: Medicare Other | Admitting: Internal Medicine

## 2014-02-01 ENCOUNTER — Encounter: Payer: Self-pay | Admitting: Internal Medicine

## 2014-02-01 VITALS — BP 149/84 | HR 91 | Resp 16 | Ht <= 58 in | Wt 144.0 lb

## 2014-02-01 DIAGNOSIS — G20 Parkinson's disease: Secondary | ICD-10-CM

## 2014-02-01 DIAGNOSIS — F411 Generalized anxiety disorder: Secondary | ICD-10-CM | POA: Diagnosis not present

## 2014-02-01 DIAGNOSIS — IMO0001 Reserved for inherently not codable concepts without codable children: Secondary | ICD-10-CM

## 2014-02-01 DIAGNOSIS — J984 Other disorders of lung: Secondary | ICD-10-CM | POA: Diagnosis not present

## 2014-02-01 DIAGNOSIS — R0609 Other forms of dyspnea: Secondary | ICD-10-CM

## 2014-02-01 DIAGNOSIS — R61 Generalized hyperhidrosis: Secondary | ICD-10-CM

## 2014-02-01 LAB — CBC WITH DIFFERENTIAL/PLATELET
BASOS ABS: 0.1 10*3/uL (ref 0.0–0.1)
Basophils Relative: 1 % (ref 0–1)
EOS PCT: 1 % (ref 0–5)
Eosinophils Absolute: 0.1 10*3/uL (ref 0.0–0.7)
HCT: 42.2 % (ref 36.0–46.0)
Hemoglobin: 14.4 g/dL (ref 12.0–15.0)
LYMPHS ABS: 1.2 10*3/uL (ref 0.7–4.0)
LYMPHS PCT: 19 % (ref 12–46)
MCH: 29.6 pg (ref 26.0–34.0)
MCHC: 34.1 g/dL (ref 30.0–36.0)
MCV: 86.7 fL (ref 78.0–100.0)
MONO ABS: 0.6 10*3/uL (ref 0.1–1.0)
MONOS PCT: 9 % (ref 3–12)
MPV: 9.1 fL (ref 8.6–12.4)
Neutro Abs: 4.6 10*3/uL (ref 1.7–7.7)
Neutrophils Relative %: 70 % (ref 43–77)
PLATELETS: 343 10*3/uL (ref 150–400)
RBC: 4.87 MIL/uL (ref 3.87–5.11)
RDW: 13.2 % (ref 11.5–15.5)
WBC: 6.5 10*3/uL (ref 4.0–10.5)

## 2014-02-01 LAB — T3, FREE: T3, Free: 2.7 pg/mL (ref 2.3–4.2)

## 2014-02-01 LAB — T4, FREE: Free T4: 1.45 ng/dL (ref 0.80–1.80)

## 2014-02-01 LAB — TSH: TSH: 0.895 u[IU]/mL (ref 0.350–4.500)

## 2014-02-01 NOTE — Patient Instructions (Signed)
See me 30 mins 3 weeks

## 2014-02-02 LAB — ANA: Anti Nuclear Antibody(ANA): NEGATIVE

## 2014-02-02 NOTE — Progress Notes (Signed)
Subjective:    Patient ID: Destiny Roth, female    DOB: [birth year], 64 y.o.   MRN: [id number]  HPI Ms Park is a new pt here for consultation .   Her PCP is Dr. Cathlean Cower.  She is a former [employer] employee and has not worked since 05/2012  She has extensive PMH including HTN (requiring 4 drug Therapy),  Depression (formerly on Cymbalta but has been off approx 2-3 weeks), Hyperlipidemia  (On lipitor),  Osteopenia, restricitive lung disease,   Mild OSA (not requiring CPAP), IBS, and more recently diagnosed with Parkinsons disease November of 2013 (initially presenting  with tremor and is on Mirapex)  Strong FH of auto-immune disease in mother who had Raynaud's and scleroderma, and MI in father and brother   She is primarily concerned about a feeling of internal heat sensation ,  Weight gain , and DOE.   Pt has had extensive work up with pulmonary and cardiology for her dyspnea and she has been recommended to start pulmonary rehab.   She describes that she experiences an internal sensation of heat over the past one year period but it is not associated with any diaphoresis.   She was begun on Cymbalta 30 mg in early December and just discontinued this 2 weeks ago.  LMP at age 76. No hot flushes perimenopausally    No Known Allergies Past Medical History  Diagnosis Date  . GLUCOSE INTOLERANCE 05/24/2008  . HYPERLIPIDEMIA 09/23/2006  . ANXIETY 09/23/2006  . DEPRESSION 09/23/2006  . CARPAL TUNNEL SYNDROME, BILATERAL 09/23/2006  . HYPERTENSION 09/23/2006  . GERD 05/24/2008  . DIVERTICULOSIS, COLON 09/23/2006  . IBS 09/23/2006  . BUNION, RIGHT FOOT 09/18/2009  . OSTEOPOROSIS 05/24/2008  . Abdominal pain, epigastric 09/18/2009  . Impaired glucose tolerance 11/04/2010  . Anosmia 11/06/2010  . Parkinson's disease   . Restrictive lung disease 12/20/2013   Past Surgical History  Procedure Laterality Date  . Tubal ligation    . Nasal septum repair    . S/p ganglion cyst right wrist    . S/p  bilat cts    . S/p right thumb trigger finger surgury    . S/p fibroid ablation    . Esophagogastroduodenoscopy  2008    Dr. Harriette Bouillon duodenal ulcer  . Colonoscopy w/ biopsies  2008    Dr. Collene Mares -negative random bxs  . Cataract extraction    . Shoulder surgery Right 2015  . Foot surgery Bilateral 2015  . Cardiac catheterization    . Left and right heart catheterization with coronary angiogram N/A 11/09/2013    Procedure: LEFT AND RIGHT HEART CATHETERIZATION WITH CORONARY ANGIOGRAM;  Surgeon: Peter M Martinique, MD;  Location: [facility] CATH LAB;  Service: Cardiovascular;  Laterality: N/A;   History   Social History  . Marital Status: Married    Spouse Name: N/A    Number of Children: 2  . Years of Education: N/A   Occupational History  . Not on file.   Social History Main Topics  . Smoking status: Never Smoker   . Smokeless tobacco: Never Used  . Alcohol Use: Yes     Comment: Occ  . Drug Use: No  . Sexual Activity:    Partners: Male    Birth Control/ Protection: Post-menopausal   Other Topics Concern  . Not on file   Social History Narrative   Family History  Problem Relation Age of Onset  . Peripheral vascular disease Mother     with bilat amputations  .  Heart attack Mother   . Hypertension Father   . Heart disease Father 33  . Hypertension Brother   . Heart attack Brother   . Hypertension Brother   . Hypertension Brother    Patient Active Problem List   Diagnosis Date Noted  . Restrictive lung disease 12/20/2013  . DOE (dyspnea on exertion) 11/18/2013  . Hot flashes 11/18/2013  . Chest pain 08/04/2013  . Memory impairment 08/04/2013  . Orthostasis 02/26/2013  . Tachycardia 02/24/2013  . Coronary artery calcification seen on CAT scan 01/01/2013  . Tendonitis, calcific, shoulder 01/01/2013  . OSA (obstructive sleep apnea) 01/01/2013  . PD (Parkinson's disease) 11/26/2011  . Hematochezia 11/09/2011  . Colon polyps 11/09/2011  . IBS (irritable bowel syndrome)  11/09/2011  . Anosmia 11/06/2010  . Impaired glucose tolerance 11/04/2010  . Hearing loss 05/07/2010  . Preventative health care 05/07/2010  . BUNION, RIGHT FOOT 09/18/2009  . GERD 05/24/2008  . OSTEOPOROSIS 05/24/2008  . Hyperlipidemia 09/23/2006  . Anxiety state 09/23/2006  . DEPRESSION 09/23/2006  . CARPAL TUNNEL SYNDROME, BILATERAL 09/23/2006  . Essential hypertension 09/23/2006  . DIVERTICULOSIS, COLON 09/23/2006   Current Outpatient Prescriptions on File Prior to Visit  Medication Sig Dispense Refill  . albuterol (PROVENTIL HFA;VENTOLIN HFA) 108 (90 BASE) MCG/ACT inhaler Inhale 2 puffs into the lungs every 6 (six) hours as needed for wheezing or shortness of breath. 1 Inhaler 2  . aspirin 81 MG tablet Take 81 mg by mouth daily.    Marland Kitchen atorvastatin (LIPITOR) 20 MG tablet TAKE 1 TABLET DAILY (Patient taking differently: 1/2 tablet) 90 tablet 2  . diclofenac (VOLTAREN) 75 MG EC tablet Take 1 tablet (75 mg total) by mouth 2 (two) times daily. 80 tablet 1  . diltiazem (CARDIZEM CD) 120 MG 24 hr capsule Take 1 capsule (120 mg total) by mouth 2 (two) times daily. (Patient taking differently: Take 120 mg by mouth daily. ) 180 capsule 3  . hydrochlorothiazide (HYDRODIURIL) 25 MG tablet Take 1 tablet (25 mg total) by mouth daily. 90 tablet 4  . irbesartan (AVAPRO) 150 MG tablet Take 1 tablet (150 mg total) by mouth daily. 90 tablet 3  . KLOR-CON 10 10 MEQ tablet TAKE 1 TABLET DAILY 90 tablet 3  . metoprolol tartrate (LOPRESSOR) 25 MG tablet Take 1 tablet (25 mg total) by mouth 2 (two) times daily. 180 tablet 4  . Multiple Vitamins-Calcium (ONE-A-DAY WOMENS PO) Take 1 tablet by mouth daily.    . Pramipexole Dihydrochloride (MIRAPEX ER) 3 MG TB24 Take 3 mg by mouth daily.     . pramipexole (MIRAPEX) 1 MG tablet Take 1 tablet (1 mg total) by mouth 3 (three) times daily. (Patient not taking: Reported on 02/01/2014) 270 tablet 3   No current facility-administered medications on file prior to visit.         Review of Systems See HPI     Objective:   Physical Exam  Physical Exam  Nursing note and vitals reviewed.  Constitutional: She is oriented to person, place, and time. She appears well-developed and well-nourished.  HENT:  Head: Normocephalic and atraumatic.  Cardiovascular: Normal rate and regular rhythm. Exam reveals no gallop and no friction rub.  No murmur heard.  Pulmonary/Chest: Breath sounds normal. She has no wheezes. She has no rales.  Neurological: She is alert and oriented to person, place, and time.  Skin: Skin is warm and dry.  NO evidence of any rash Psychiatric: She has a normal mood and affect. Her behavior is  normal.         Assessment & Plan:  Heat sensation:   Her description is not consistent with vasomotor flushes and it is extremely rare to start a perimenopausal flush at age 33 without any prior symptoms.   Her parkinson's has been recently diagnosed and it is difficult to believe that this is a temp dysregulation from dysautonomia.  She does not have rash or diarrhea to suggest endocrine etiology or 5-HIAA .  Due to strong FH of auto-immune disorder in mother  I will  Check ANA.  Will also check metanephrines for completeness  I have seen SE of Cymbalta cause menopausal heat sensation.   She is off this now and will see if this helps  DOE/  Mild OSA/ Restrictive lung disease Extensive neg work up for this  With both cardiology and pulmonary .  To start pulm rehab soon  Parkinsons  Well controlled on MIrapex  Depression apparently pt reports this has been a long standing diagnosis for her .  She cannot recall other meds she has been on .  I counseled that she should not try Cymbalta.    Hyperlipidemia  On statin   See me in 2-3 weeks  I spent 60 mins with pt  Extensive counseling

## 2014-02-02 NOTE — Telephone Encounter (Signed)
No return calls from pt.  Call to be closed.

## 2014-02-03 ENCOUNTER — Encounter: Payer: Self-pay | Admitting: *Deleted

## 2014-02-04 LAB — METANEPHRINES, PLASMA
Metanephrine, Free: <25 pg/mL (ref <=57)
Normetanephrine, Free: 138 pg/mL (ref <=148)
Total Metanephrines-Plasma: 138 pg/mL (ref <=205)

## 2014-02-07 ENCOUNTER — Encounter (HOSPITAL_COMMUNITY): Payer: Self-pay

## 2014-02-07 ENCOUNTER — Encounter (HOSPITAL_COMMUNITY)
Admission: RE | Admit: 2014-02-07 | Discharge: 2014-02-07 | Disposition: A | Discharge status: Home / Self Care | Payer: Medicare Other | Source: Ambulatory Visit | Attending: Pulmonary Disease | Admitting: Pulmonary Disease

## 2014-02-07 ENCOUNTER — Ambulatory Visit: Payer: Medicare Other | Admitting: Neurology

## 2014-02-07 VITALS — BP 124/70 | HR 81

## 2014-02-07 DIAGNOSIS — J439 Emphysema, unspecified: Secondary | ICD-10-CM | POA: Insufficient documentation

## 2014-02-07 NOTE — Progress Notes (Signed)
Destiny Roth 64 y.o. female Pulmonary Rehab Orientation Note Patient arrived today in Cardiac and Pulmonary Rehab for orientation to Pulmonary Rehab. She was transported from [employer] via wheel chair. She does not carry portable oxygen. Per pt, she uses oxygen never. Color good, skin warm and dry. Patient is oriented to time and place. Patient's medical history and medications reviewed. Heart rate is normal , breath sounds clear to auscultation, no wheezes, rales, or rhonchi. Grip strength equal, strong. Distal pulses 2+ bilateral pedal pulses present. Patient reports she does take medications as prescribed. Patient states she follows a Regular diet. The patient has been trying to lose weight through a healthy diet and exercise program.  Patient's weight will be monitored closely. Demonstration and practice of PLB using pulse oximeter. Patient able to return demonstration satisfactorily. Safety and hand hygiene in the exercise area reviewed with patient. Patient voices understanding of the information reviewed. Department expectations discussed with patient and achievable goals were set. The patient shows enthusiasm about attending the program and we look forward to working with this nice lady. The patient is scheduled for a 6 min walk test on Tuesday, February 08, 2014 @ 3:45pm and to begin exercise on Thursday, February 10, 2014. Patient rated 18 on the PHQ12 scale.  She has some issues, health and emotional that she needs to work through.  Patient is in agreement and will discuss with Dr. Jenny Reichmann who he might refer her to.  She retired from the [employer] and it was not a amicable situation.    [id number] time spent with patient [name] RN

## 2014-02-08 ENCOUNTER — Encounter (HOSPITAL_COMMUNITY)
Admission: RE | Admit: 2014-02-08 | Discharge: 2014-02-08 | Disposition: A | Discharge status: Home / Self Care | Payer: Medicare Other | Source: Ambulatory Visit | Attending: Pulmonary Disease | Admitting: Pulmonary Disease

## 2014-02-08 DIAGNOSIS — J439 Emphysema, unspecified: Secondary | ICD-10-CM | POA: Diagnosis not present

## 2014-02-08 NOTE — Progress Notes (Signed)
Destiny Roth completed a Six-Minute Walk Test on 02/08/14 . Destiny Roth walked 1,333 feet with 0 breaks.  The patient's lowest oxygen saturation was 93% , highest heart rate was 126 bpm , and highest blood pressure was 134/70. The patient was on room air.  Destiny Roth stated that shortness of breath hindered their walk test.

## 2014-02-09 ENCOUNTER — Ambulatory Visit (INDEPENDENT_AMBULATORY_CARE_PROVIDER_SITE_OTHER): Payer: Medicare Other | Admitting: Neurology

## 2014-02-09 ENCOUNTER — Encounter: Payer: Self-pay | Admitting: Neurology

## 2014-02-09 VITALS — BP 106/70 | HR 68 | Ht <= 58 in | Wt 143.0 lb

## 2014-02-09 DIAGNOSIS — G20 Parkinson's disease: Secondary | ICD-10-CM

## 2014-02-09 DIAGNOSIS — R5382 Chronic fatigue, unspecified: Secondary | ICD-10-CM | POA: Diagnosis not present

## 2014-02-09 DIAGNOSIS — R600 Localized edema: Secondary | ICD-10-CM | POA: Diagnosis not present

## 2014-02-09 DIAGNOSIS — F330 Major depressive disorder, recurrent, mild: Secondary | ICD-10-CM

## 2014-02-09 NOTE — Progress Notes (Signed)
Subjective:   Destiny Roth was seen in f/u for PD diagnosed 10/2011.  The patient is a 64 y.o. right handed female with a history of tremor.Onset of symptoms was at end of 2012.   She has noted a gradual loss of smell and taste over the last 20 years.  She even had a rhinoplasty and septoplasty in 1993 to see if that would help and it did not.  She can smell almost nothing now.    She is on Mirapex ER 1.5 mg daily.  The patient states that the tremor is virtually gone, with the exception of when she is stressed at work and then she will notice tremor in both legs.  Her walking is much better.  She feels more stable.  .  She does have some dizziness in the early morning and is unsure if that is related to Mirapex, her blood pressure medication or potassium.  No compulsive behaviors have been noted with the Mirapex.  There have been no sleep attacks.  She has not yet started exercising.  She is going to be changing jobs which is stressful for her.  10/12/12 update:  She has had some tremor internally and in the legs.  She notices some word finding trouble.   She has been under increased stress.  Her husband had back surgery 2 weeks ago and she has been the primary caregiver.  In addition, she is between jobs and needs to find a new job.  She would like to see if she can get off her blood pressure medication.  02/08/13 update:  Last visit, I increased the patient's Mirapex to a total of 2.25 mg daily.  The patient states that it definitely helped the tremor.  She almost never sees tremor any more.  She is, however, complaining about weight gain.  Her weight has been stable for many months, but when we looked back she gained weight between her May, 2014 visit and her September, 2014 visit.  She is frustrated that she cannot get that off.  In addition, she complains of some low energy and vaginal dryness.  I did look at her prior medical records since last visit.  She was complaining to her primary care  physician about dizziness and her blood pressure medication was decreased.  She did see Dr. Elsworth Soho since last visit and a split-night sleep study was recommended.  She states that she is scheduled to have foot surgery for a bunion on February 17 2013.  She had a procedure this morning to "break up a calcification" in the right shoulder.  06/07/13 update:  Pt is f/u today.  The records that were made available to me were reviewed since last visit.  Pt had surgery on both feet on separate dates for bunyionectomies.  Had tachycardia after the first and ended up having a holter monitor but HR was normal on the monitor.  Diltiazem was decreased, pindolol was added.  She had a PSG done since last visit and had an AHI of 12.   No CPAP has been recommended at this point.  She also had a R shoulder surgery on April 7 but those records are not available to me.  She does c/o if bending over, she will have lightheadedness but separate from that she has had 2 episodes of vertigo (room spinning).  Only tremor in the R hand if she has to hold out the arm that she has surgery on and then she experiences pain, so she is  not sure if the tremor is related to the pain or the surgery.  She is planning to go to [place] in about 5 weeks and is worried that she will not feel better by then.  Overall, since the arm surgery and feet surgery, she just does not feel back to herself.  She has not been able to exercise.  10/08/13 update:  The patient is following up today regarding her Parkinson's disease.  Last visit, I increased her Mirapex to 3.0 mg daily.  She is also on Cymbalta.  She has been having more fatigue and shortness of breath.  She just had a nuclear stress test done that was normal.  Her left ventricular ejection fraction was 77%.  She denies falls.  No syncope.  She is not sleepy, just fatigued during the day.  Saw Dr. Harrington Challenger today and increasing diltiazem and metoprolol is being decreased.  02/09/14 update:  The records that were  made available to me were reviewed since last visit.  Pt is accompanied by her husband who supplements the history today (never been present before).  Pt is supposed to be on mirapex 1.0 mg tid but has backed down to bid dosing.  I had to change this from the 3.0 mg ER version last visit because of insurance.  She has been to several other providers since last visit and I have discussed cases with several providers.  She has had an extensive cardiopulmonary workup for fatigue and shortness of breath.  She had an echocardiogram, stress Myoview and cardiac catheterization that were all unremarkable.  She does have mild to moderate obstructive sleep apnea syndrome, but refuses CPAP.  She saw Dr. Coralyn Mark for the sensation of being hot.  She has had an extensive workup for this as well.  No etiology has been determined.  She did go off of the Cymbalta to see if that would be of benefit.  She has been off of it since end of November and that hasn't changed.  No sweating at all.  Just feels like she has a "hot flash" without sweating.  Not sure that depression got worse going off of it but not sure if deals with things as well but also thinks that had SE with the cymbalta.  Pt does think that the mirapex may be causing some of the above issues.  She has been having LE swelling and hand edema as well.  She states that she has a "brain fog" that she thinks is due to the med.  She has a diffuse weakness and an inability to exercise due to SOB.  Again, she wonders if it is all due to mirapex.    Current/Previously tried tremor medications: Klonopin  Current medications that may exacerbate tremor:  ?Cymbalta.  I reviewed the MRI of the brain done at the end of 2013 with her.  This revealed some mild small vessel disease, likely secondary to hypertension.  Otherwise, it was unremarkable.  The small vessel disease was not within the basal ganglia.  I showed her the films in the office today.   No Known  Allergies  Current Outpatient Prescriptions on File Prior to Visit  Medication Sig Dispense Refill  . albuterol (PROVENTIL HFA;VENTOLIN HFA) 108 (90 BASE) MCG/ACT inhaler Inhale 2 puffs into the lungs every 6 (six) hours as needed for wheezing or shortness of breath. 1 Inhaler 2  . aspirin 81 MG tablet Take 81 mg by mouth daily.    Marland Kitchen atorvastatin (LIPITOR) 20 MG tablet  TAKE 1 TABLET DAILY (Patient taking differently: 1/2 tablet) 90 tablet 2  . diclofenac (VOLTAREN) 75 MG EC tablet Take 1 tablet (75 mg total) by mouth 2 (two) times daily. 80 tablet 1  . diltiazem (CARDIZEM CD) 120 MG 24 hr capsule Take 1 capsule (120 mg total) by mouth 2 (two) times daily. (Patient taking differently: Take 120 mg by mouth daily. ) 180 capsule 3  . hydrochlorothiazide (HYDRODIURIL) 25 MG tablet Take 1 tablet (25 mg total) by mouth daily. 90 tablet 4  . KLOR-CON 10 10 MEQ tablet TAKE 1 TABLET DAILY 90 tablet 3  . metoprolol tartrate (LOPRESSOR) 25 MG tablet Take 1 tablet (25 mg total) by mouth 2 (two) times daily. 180 tablet 4  . Multiple Vitamins-Calcium (ONE-A-DAY WOMENS PO) Take 1 tablet by mouth daily.    . pramipexole (MIRAPEX) 1 MG tablet Take 1 tablet (1 mg total) by mouth 3 (three) times daily. 270 tablet 3   No current facility-administered medications on file prior to visit.    Past Medical History  Diagnosis Date  . GLUCOSE INTOLERANCE 05/24/2008  . HYPERLIPIDEMIA 09/23/2006  . ANXIETY 09/23/2006  . DEPRESSION 09/23/2006  . CARPAL TUNNEL SYNDROME, BILATERAL 09/23/2006  . HYPERTENSION 09/23/2006  . GERD 05/24/2008  . DIVERTICULOSIS, COLON 09/23/2006  . IBS 09/23/2006  . BUNION, RIGHT FOOT 09/18/2009  . OSTEOPOROSIS 05/24/2008  . Abdominal pain, epigastric 09/18/2009  . Impaired glucose tolerance 11/04/2010  . Anosmia 11/06/2010  . Parkinson's disease   . Restrictive lung disease 12/20/2013    Past Surgical History  Procedure Laterality Date  . Tubal ligation    . Nasal septum repair    . S/p  ganglion cyst right wrist    . S/p bilat cts    . S/p right thumb trigger finger surgury    . S/p fibroid ablation    . Esophagogastroduodenoscopy  17-May-2006    Dr. Harriette Bouillon duodenal ulcer  . Colonoscopy w/ biopsies  17-May-2006    Dr. Collene Mares -negative random bxs  . Cataract extraction    . Shoulder surgery Right 05-16-13  . Foot surgery Bilateral 05-16-2013  . Cardiac catheterization    . Left and right heart catheterization with coronary angiogram N/A 11/09/2013    Procedure: LEFT AND RIGHT HEART CATHETERIZATION WITH CORONARY ANGIOGRAM;  Surgeon: Peter M Martinique, MD;  Location: [facility] CATH LAB;  Service: Cardiovascular;  Laterality: N/A;    History   Social History  . Marital Status: Married    Spouse Name: N/A    Number of Children: 2  . Years of Education: N/A   Occupational History  . Not on file.   Social History Main Topics  . Smoking status: Never Smoker   . Smokeless tobacco: Never Used  . Alcohol Use: Yes     Comment: Occ  . Drug Use: No  . Sexual Activity:    Partners: Male    Birth Control/ Protection: Post-menopausal   Other Topics Concern  . Not on file   Social History Narrative    Family Status  Relation Status Death Age  . Mother Deceased     May 16, 2001, PVD, [place], [place]  . Father Deceased 21    MI, hypertension since teens  . Child Alive     2, alive and well  . Maternal Grandmother Deceased     PD  . Brother Alive   . Brother Alive   . Brother Alive     Review of Systems A complete 10 system ROS was  obtained and was negative apart from what is mentioned.   Objective:   VITALS:   Filed Vitals:   02/09/14 1106  BP: 106/70  Pulse: 68  Height: 4' 8.5" (1.435 m)  Weight: 143 lb (64.864 kg)   Wt Readings from Last 3 Encounters:  02/09/14 143 lb (64.864 kg)  02/01/14 144 lb (65.318 kg)  01/12/14 147 lb 6.4 oz (66.86 kg)    GEN:  The patient appears stated age (actually younger) and is in NAD. HEENT:  Normocephalic, atraumatic.  The mucous membranes are moist.  The superficial temporal arteries are without ropiness or tenderness. CV:  RRR Lungs:  CTAB Neck/HEME:  There are no carotid bruits bilaterally.  Neurological examination:  Orientation: The patient is alert and oriented x3. Fund of knowledge is appropriate.  Recent and remote memory are intact.  Attention and concentration are normal.    Able to name objects and repeat phrases. Cranial nerves: There is good facial symmetry.   Extraocular muscles are intact.  There are no square wave jerks. The speech is fluent and clear. Soft palate rises symmetrically and there is no tongue deviation. Hearing is intact to conversational tone. Sensation: Sensation is intact to light to light touch throughout. Motor: Strength is 5/5 in the bilateral upper and lower extremities.   Shoulder shrug is equal and symmetric.  There is no pronator drift.   Movement examination: Tone: There is normal tone bilaterally Abnormal movements: There was no tremor today even with distraction procedures. Coordination:  There is normal RAM's today Gait and Station:    Her gait had a fairly normal stride length today.  Arm swing was good.  She is stable today.  LABS:  Lab Results  Component Value Date   WBC 6.5 02/01/2014   HGB 14.4 02/01/2014   HCT 42.2 02/01/2014   MCV 86.7 02/01/2014   PLT 343 02/01/2014     Chemistry      Component Value Date/Time   NA 137 12/31/2013 0749   K 3.6 12/31/2013 0749   CL 98 12/31/2013 0749   CO2 26 12/31/2013 0749   BUN 18 12/31/2013 0749   CREATININE 0.8 12/31/2013 0749      Component Value Date/Time   CALCIUM 9.8 12/31/2013 0749   ALKPHOS 75 12/31/2013 0749   AST 40* 12/31/2013 0749   ALT 41* 12/31/2013 0749   BILITOT 0.6 12/31/2013 0749     Lab Results  Component Value Date   VITAMINB12 443 11/26/2011   Lab Results  Component Value Date   FOLATE 13.5 11/26/2011   Lab Results  Component Value Date   TSH 0.895 02/01/2014        Assessment/Plan:   1.   Akinetic rigid Parkinson's disease.  -I had a long discussion with the patient and her husband today.  Much greater than 50% of this 60 minute visit was spent in counseling with the patient and her husband.  While Mirapex certainly can cause edema and cognitive dulling, I have not seen it cause shortness of breath previously.  I wonder if this is not from deconditioning.  Nonetheless, I gave her multiple options.  I talked to her about potentially switching her to Requip.  However, this is still an agonist drug.  I could drop the equivant dose of Requip (perhaps a total of 6 mg), however.  I talked to her about levodopa, but she really is well controlled on an agonist drug.  In the end, she decided that she would like to  hold the Mirapex for about 5 days.  I did warn her that her Parkinson symptoms likely will get much worse for the next few days, but it certainly is not going to hurt her on the long-term.  She should be able to assess whether or not her edema and cognition are better.  She will call me next week and let me know. 2.  Fatigue  -I really do not think it is from the Parkinson's disease.  Extensive cardiopulm w/u, including cardiac cath was negative.  We talked about cardiovascular exercise and she is to start cardiopulmonary rehabilitation tomorrow. 3.  Depression  -Off cymbalta right now because of feeling of internal "heat" although no sweating.  I do think that she may need to go back on something in the future, but she wants to get these other issues worked out first.

## 2014-02-10 ENCOUNTER — Encounter (HOSPITAL_COMMUNITY)
Admission: RE | Admit: 2014-02-10 | Discharge: 2014-02-10 | Disposition: A | Discharge status: Home / Self Care | Payer: Medicare Other | Source: Ambulatory Visit | Attending: Pulmonary Disease | Admitting: Pulmonary Disease

## 2014-02-10 DIAGNOSIS — J439 Emphysema, unspecified: Secondary | ICD-10-CM | POA: Diagnosis not present

## 2014-02-10 NOTE — Progress Notes (Signed)
Today, Destiny Roth exercised at [employer] [facility]. Service time was from 11:00am to 1:00pm.  The patient exercised for more than 31 minutes performing aerobic, strengthening, and stretching exercises. Oxygen saturation, heart rate, blood pressure, rate of perceived exertion, and shortness of breath were all monitored before, during, and after exercise. Destiny Roth presented with no problems at today's exercise session. The patient attended education class today with Dr. Nelda Marseille.  There was no workload change during today's exercise session.  Pre-exercise vitals: . Weight kg: 65.7 . Liters of O2: ra . SpO2: 96 . HR: 88 . BP: 130/80 . CBG: na  Exercise vitals: . Highest heartrate:  103 . Lowest oxygen saturation: 97 . Highest blood pressure: 142/86 . Liters of 02: ra  Post-exercise vitals: . SpO2: 98 . HR: 84 . BP: 124/60 . Liters of O2: ra . CBG: na  Dr. Brand Males, Medical Director Dr. Algis Liming is immediately available during today's Pulmonary Rehab session for Destiny Roth on 02/10/14 at 10:30am class time.

## 2014-02-15 ENCOUNTER — Encounter (HOSPITAL_COMMUNITY): Payer: Medicare Other

## 2014-02-15 ENCOUNTER — Encounter: Payer: Self-pay | Admitting: Internal Medicine

## 2014-02-15 ENCOUNTER — Ambulatory Visit (INDEPENDENT_AMBULATORY_CARE_PROVIDER_SITE_OTHER): Payer: Medicare Other | Admitting: Internal Medicine

## 2014-02-15 ENCOUNTER — Telehealth: Payer: Self-pay | Admitting: Neurology

## 2014-02-15 VITALS — BP 160/90 | HR 81 | Temp 97.9°F | Ht <= 58 in | Wt 141.0 lb

## 2014-02-15 DIAGNOSIS — I10 Essential (primary) hypertension: Secondary | ICD-10-CM

## 2014-02-15 DIAGNOSIS — F411 Generalized anxiety disorder: Secondary | ICD-10-CM | POA: Diagnosis not present

## 2014-02-15 DIAGNOSIS — F32A Depression, unspecified: Secondary | ICD-10-CM

## 2014-02-15 DIAGNOSIS — F329 Major depressive disorder, single episode, unspecified: Secondary | ICD-10-CM | POA: Diagnosis not present

## 2014-02-15 MED ORDER — ESCITALOPRAM OXALATE 10 MG PO TABS: 10.0000 mg | ORAL_TABLET | Freq: Every day | ORAL | Status: DC

## 2014-02-15 MED ORDER — ROPINIROLE HCL 2 MG PO TABS: 2.0000 mg | ORAL_TABLET | Freq: Three times a day (TID) | ORAL | Status: DC

## 2014-02-15 NOTE — Progress Notes (Signed)
Subjective:    Patient ID: Destiny Roth, female    DOB: [birth year], 64 y.o.   MRN: [id number]  HPI  Here to f/u, c/o worsening depressive symtpoms and anxiety possibly worse after weaning off cymbalta, which in retrospect may not have been related to decreased libido (no change off) but also apathy (now improved off med).  Recent mirapex changed to requip per neuro due to somatic symptoms per pt that she thought might be related; mentions recent chest pains, foggy head, speech difficulty, hot flashes, sweats. Bp has been 130's consistently at hoome.  Also noted some increased peripheral edema/puffiness to extremities after 7 wks diclofenac 75 bid per podiatry.   Past Medical History  Diagnosis Date  . GLUCOSE INTOLERANCE 05/24/2008  . HYPERLIPIDEMIA 09/23/2006  . ANXIETY 09/23/2006  . DEPRESSION 09/23/2006  . CARPAL TUNNEL SYNDROME, BILATERAL 09/23/2006  . HYPERTENSION 09/23/2006  . GERD 05/24/2008  . DIVERTICULOSIS, COLON 09/23/2006  . IBS 09/23/2006  . BUNION, RIGHT FOOT 09/18/2009  . OSTEOPOROSIS 05/24/2008  . Abdominal pain, epigastric 09/18/2009  . Impaired glucose tolerance 11/04/2010  . Anosmia 11/06/2010  . Parkinson's disease   . Restrictive lung disease 12/20/2013   Past Surgical History  Procedure Laterality Date  . Tubal ligation    . Nasal septum repair    . S/p ganglion cyst right wrist    . S/p bilat cts    . S/p right thumb trigger finger surgury    . S/p fibroid ablation    . Esophagogastroduodenoscopy  2008    Dr. Harriette Bouillon duodenal ulcer  . Colonoscopy w/ biopsies  2008    Dr. Collene Mares -negative random bxs  . Cataract extraction    . Shoulder surgery Right 2015  . Foot surgery Bilateral 2015  . Cardiac catheterization    . Left and right heart catheterization with coronary angiogram N/A 11/09/2013    Procedure: LEFT AND RIGHT HEART CATHETERIZATION WITH CORONARY ANGIOGRAM;  Surgeon: Peter M Martinique, MD;  Location: [facility] CATH LAB;  Service: Cardiovascular;  Laterality:  N/A;    reports that she has never smoked. She has never used smokeless tobacco. She reports that she drinks alcohol. She reports that she does not use illicit drugs. family history includes Heart attack in her brother and mother; Heart disease (age of onset: 7) in her father; Hypertension in her brother, brother, brother, and father; Peripheral vascular disease in her mother. No Known Allergies Current Outpatient Prescriptions on File Prior to Visit  Medication Sig Dispense Refill  . albuterol (PROVENTIL HFA;VENTOLIN HFA) 108 (90 BASE) MCG/ACT inhaler Inhale 2 puffs into the lungs every 6 (six) hours as needed for wheezing or shortness of breath. 1 Inhaler 2  . aspirin 81 MG tablet Take 81 mg by mouth daily.    Marland Kitchen atorvastatin (LIPITOR) 20 MG tablet TAKE 1 TABLET DAILY (Patient taking differently: 1/2 tablet) 90 tablet 2  . diltiazem (CARDIZEM CD) 120 MG 24 hr capsule Take 1 capsule (120 mg total) by mouth 2 (two) times daily. (Patient taking differently: Take 120 mg by mouth daily. ) 180 capsule 3  . hydrochlorothiazide (HYDRODIURIL) 25 MG tablet Take 1 tablet (25 mg total) by mouth daily. 90 tablet 4  . KLOR-CON 10 10 MEQ tablet TAKE 1 TABLET DAILY 90 tablet 3  . metoprolol tartrate (LOPRESSOR) 25 MG tablet Take 1 tablet (25 mg total) by mouth 2 (two) times daily. 180 tablet 4  . Multiple Vitamins-Calcium (ONE-A-DAY WOMENS PO) Take 1 tablet by mouth daily.    Marland Kitchen  rOPINIRole (REQUIP) 2 MG tablet Take 1 tablet (2 mg total) by mouth 3 (three) times daily. 90 tablet 2   No current facility-administered medications on file prior to visit.   Wt Readings from Last 3 Encounters:  02/15/14 141 lb (63.957 kg)  02/09/14 143 lb (64.864 kg)  02/01/14 144 lb (65.318 kg)   Review of Systems  Constitutional: Negative for unusual diaphoresis or other sweats  HENT: Negative for ringing in ear Eyes: Negative for double vision or worsening visual disturbance.  Respiratory: Negative for choking and stridor.    Gastrointestinal: Negative for vomiting or other signifcant bowel change Genitourinary: Negative for hematuria or decreased urine volume.  Musculoskeletal: Negative for other MSK pain or swelling Skin: Negative for color change and worsening wound.  Neurological: Negative for numbness other than noted  Psychiatric/Behavioral: Negative for decreased concentration or agitation other than above       Objective:   Physical Exam BP 160/90 mmHg  Pulse 81  Temp(Src) 97.9 F (36.6 C) (Oral)  Ht 4' 8.5" (1.435 m)  Wt 141 lb (63.957 kg)  BMI 31.06 kg/m2  SpO2 98% VS noted,  Constitutional: Pt appears well-developed, well-nourished.  HENT: Head: NCAT.  Right Ear: External ear normal.  Left Ear: External ear normal.  Eyes: . Pupils are equal, round, and reactive to light. Conjunctivae and EOM are normal Neck: Normal range of motion. Neck supple.  Cardiovascular: Normal rate and regular rhythm.   Pulmonary/Chest: Effort normal and breath sounds without rales or wheezing.  Abd:  Soft, NT, ND, + BS Neurological: Pt is alert. Not confused , motor grossly intact, + tremors right foot Skin: Skin is warm. No rash, trace edema of hands and feeet Psychiatric: Pt behavior is normal. No agitation. + depressive affect, mild nervous    Assessment & Plan:

## 2014-02-15 NOTE — Telephone Encounter (Signed)
Patient made aware. RX sent to pharmacy.  

## 2014-02-15 NOTE — Assessment & Plan Note (Signed)
Reassured, cont current tx, consider low dose trial klonopin but will hold for now

## 2014-02-15 NOTE — Assessment & Plan Note (Signed)
stable overall by history and exam, recent data reviewed with pt, and pt to continue medical treatment as before,  to f/u any worsening symptoms or concerns BP Readings from Last 3 Encounters:  02/15/14 160/90  02/09/14 106/70  02/01/14 149/84

## 2014-02-15 NOTE — Progress Notes (Signed)
Pre visit review using our clinic review tool, if applicable. No additional management support is needed unless otherwise documented below in the visit note. 

## 2014-02-15 NOTE — Addendum Note (Signed)
Addended by: Biagio Borg on: 02/15/2014 09:31 PM   Modules accepted: Level of Service

## 2014-02-15 NOTE — Telephone Encounter (Signed)
Lets change to requip and see how she does.  May have same SE as the mirapex did but lets try.  requip 2 mg tid.  She can start today (should be similar to the mirapex dose of 1 mg bid that she was taking).

## 2014-02-15 NOTE — Telephone Encounter (Signed)
Patient called this morning. She states she is not functioning well. She stopped Mirapex 6 days ago. She has slowly had "Parkinson's Symptoms" come back which she describes as a trembling in her right foot and depression. She states her edema is much better (states down 4 lbs since Thursday), tightness in chest and hot flashes are better. Her blood pressure has been running good. She took one Mirapex this morning because she states her symptoms were bad this morning, not very specific about symptoms. She states multiple times that she is just not feeling well. Having a hard time getting her words out. She has pulmonary rehab this morning and is concerned that she will not be able to do this. Please advise.

## 2014-02-15 NOTE — Patient Instructions (Signed)
Please take all new medication as prescribed - the lexapro  Please continue all other medications as before  Please have the pharmacy call with any other refills you may need.  Please keep your appointments with your specialists as you may have planned  Please return in 6 months, or sooner if needed

## 2014-02-15 NOTE — Assessment & Plan Note (Addendum)
Mild to mod, for lexapro start,  to f/u any worsening symptoms or concerns  Note:  Total time for pt hx, exam, review of record with pt in the room, determination of diagnoses and plan for further eval and tx is > 40 min, with over 50% spent in coordination and counseling of patient

## 2014-02-16 ENCOUNTER — Telehealth: Payer: Self-pay | Admitting: Neurology

## 2014-02-16 NOTE — Telephone Encounter (Signed)
She DOES or does NOT feel better on requip?  Message says does but then says that she is tired and nauseated and those were the c/o on mirapex.  Only been on requip a few days, right?

## 2014-02-16 NOTE — Telephone Encounter (Signed)
Patient called - she states she does feel better on the Requip. She is experiencing tiredness and nausea. Could this be from the medication? When do you want to see patient back? Please advise.

## 2014-02-17 ENCOUNTER — Encounter (HOSPITAL_COMMUNITY)
Admission: RE | Admit: 2014-02-17 | Discharge: 2014-02-17 | Disposition: A | Discharge status: Home / Self Care | Payer: Medicare Other | Source: Ambulatory Visit | Attending: Pulmonary Disease | Admitting: Pulmonary Disease

## 2014-02-17 DIAGNOSIS — J439 Emphysema, unspecified: Secondary | ICD-10-CM | POA: Diagnosis not present

## 2014-02-17 NOTE — Telephone Encounter (Signed)
Probably need to give it a bit more than a day.  We can increase/decrease dose as we need to but would like to see her on this a little bit before it is altered.

## 2014-02-17 NOTE — Progress Notes (Signed)
Today, Destiny Roth exercised at [employer]. Service time was from 10:30am to 12:30pm.  The patient exercised for more than 31 minutes performing aerobic, strengthening, and stretching exercises. Oxygen saturation, heart rate, blood pressure, rate of perceived exertion, and shortness of breath were all monitored before, during, and after exercise. Destiny Roth presented with no problems at today's exercise session. The patient attended education today with Trish Fountain on "Oxygen Use and Safety."  There was no workload change during today's exercise session.  Pre-exercise vitals: . Weight kg: 64.9 . Liters of O2: ra . SpO2: 95 . HR: 84 . BP: 124/82 . CBG: na  Exercise vitals: . Highest heartrate:  87 . Lowest oxygen saturation: 93 . Highest blood pressure: 144/82 . Liters of 02: ra  Post-exercise vitals: . SpO2: 96 . HR: 78 . BP: 122/66 . Liters of O2: ra . CBG: na  Dr. Brand Males, Medical Director Dr. Daleen Bo is immediately available during today's Pulmonary Rehab session for Destiny Roth on 02/17/14 at 10:30am class time.

## 2014-02-17 NOTE — Telephone Encounter (Signed)
Spoke with patient. She states her speech still not where she wants it to be. Her movements are still shaky, but better. Breathing and edema better. She will stay on dosage for another week and let us know how she is doing after she has been on this for more than a couple days. She also let me know that on Monday Dr Jenny Reichmann started her on Lexapro 10 mg tablets.

## 2014-02-17 NOTE — Telephone Encounter (Signed)
Right.  We may need to increase the dosage still but lets see how she does

## 2014-02-17 NOTE — Telephone Encounter (Signed)
She feels better. She doesn't have the fuzzy head and the catch in her breathing is gone. The tremor in her leg is better, but having tiredness and nausea. She had only been on Requip for one day when she called.

## 2014-02-21 ENCOUNTER — Encounter: Payer: Self-pay | Admitting: Podiatry

## 2014-02-21 ENCOUNTER — Ambulatory Visit (INDEPENDENT_AMBULATORY_CARE_PROVIDER_SITE_OTHER): Payer: Medicare Other | Admitting: Podiatry

## 2014-02-21 ENCOUNTER — Ambulatory Visit (INDEPENDENT_AMBULATORY_CARE_PROVIDER_SITE_OTHER): Payer: Medicare Other

## 2014-02-21 VITALS — BP 178/96 | HR 99 | Resp 16

## 2014-02-21 DIAGNOSIS — S92301D Fracture of unspecified metatarsal bone(s), right foot, subsequent encounter for fracture with routine healing: Secondary | ICD-10-CM

## 2014-02-21 DIAGNOSIS — M779 Enthesopathy, unspecified: Secondary | ICD-10-CM

## 2014-02-22 ENCOUNTER — Encounter (HOSPITAL_COMMUNITY)
Admission: RE | Admit: 2014-02-22 | Discharge: 2014-02-22 | Disposition: A | Discharge status: Home / Self Care | Payer: Medicare Other | Source: Ambulatory Visit | Attending: Pulmonary Disease | Admitting: Pulmonary Disease

## 2014-02-22 ENCOUNTER — Ambulatory Visit: Payer: Medicare Other | Admitting: Internal Medicine

## 2014-02-22 DIAGNOSIS — J439 Emphysema, unspecified: Secondary | ICD-10-CM | POA: Diagnosis not present

## 2014-02-22 NOTE — Progress Notes (Signed)
Today, [name] exercised at [employer]. Service time was from 1030 to 1200.  The patient exercised for more than 31 minutes performing aerobic, strengthening, and stretching exercises. Oxygen saturation, heart rate, blood pressure, rate of perceived exertion, and shortness of breath were all monitored before, during, and after exercise. [name] presented with no problems at today's exercise session.   There was no workload change during today's exercise session.  Pre-exercise vitals: . Weight kg: 65.3 . Liters of O2: ra . SpO2: 97 . HR: 97 . BP: 126/80 . CBG: na  Exercise vitals: . Highest heartrate:  114 . Lowest oxygen saturation: 95 . Highest blood pressure: 130/70 . Liters of 02: ra  Post-exercise vitals: . SpO2: 94 . HR: 97 . BP: 120/62 . Liters of O2: ra . CBG: na Dr. Brand Males, Medical Director Dr. Tana Coast is immediately available during today's Pulmonary Rehab session for Destiny Roth on 02/22/2014 at 1030 class time.  Marland Kitchen

## 2014-02-23 ENCOUNTER — Encounter: Payer: Self-pay | Admitting: Internal Medicine

## 2014-02-23 ENCOUNTER — Ambulatory Visit (INDEPENDENT_AMBULATORY_CARE_PROVIDER_SITE_OTHER): Payer: Medicare Other | Admitting: Internal Medicine

## 2014-02-23 VITALS — BP 132/85 | HR 101 | Resp 16 | Ht <= 58 in | Wt 144.0 lb

## 2014-02-23 DIAGNOSIS — R61 Generalized hyperhidrosis: Secondary | ICD-10-CM

## 2014-02-23 DIAGNOSIS — F329 Major depressive disorder, single episode, unspecified: Secondary | ICD-10-CM | POA: Diagnosis not present

## 2014-02-23 DIAGNOSIS — G20 Parkinson's disease: Secondary | ICD-10-CM | POA: Diagnosis not present

## 2014-02-23 DIAGNOSIS — IMO0001 Reserved for inherently not codable concepts without codable children: Secondary | ICD-10-CM

## 2014-02-23 DIAGNOSIS — F32A Depression, unspecified: Secondary | ICD-10-CM

## 2014-02-23 NOTE — Progress Notes (Signed)
Subjective:     Patient ID: Destiny Roth, female   DOB: 1950/05/18, 64 y.o.   MRN: [id number]  HPI patient states that she is improving quite a bit with her pain and that she is now wearing heels which she is wearing today with minimal discomfort and only swelling after prolonged usage   Review of Systems     Objective:   Physical Exam Neurovascular status is intact with well-healed surgical sites first metatarsal bilateral with good range of motion of the first MPJ with no crepitus noted in the joint of the first MPJ bilateral    Assessment:     Doing well post osteotomy first metatarsal bilateral with patient able to wear normal shoe gear at this time    Plan:     Reviewed condition and advised on continued physical therapy and shoe gear modification as needed. Reviewed final x-rays and patient is discharged and less any symptoms should occur

## 2014-02-23 NOTE — Progress Notes (Signed)
Subjective:    Patient ID: Destiny Roth, female    DOB: 11-27-1950, 64 y.o.   MRN: [id number]  HPI  02/09/2014 neurology note 02/09/14 update: The records that were made available to me were reviewed since last visit. Pt is accompanied by her husband who supplements the history today (never been present before). Pt is supposed to be on mirapex 1.0 mg tid but has backed down to bid dosing. I had to change this from the 3.0 mg ER version last visit because of insurance. She has been to several other providers since last visit and I have discussed cases with several providers. She has had an extensive cardiopulmonary workup for fatigue and shortness of breath. She had an echocardiogram, stress Myoview and cardiac catheterization that were all unremarkable. She does have mild to moderate obstructive sleep apnea syndrome, but refuses CPAP. She saw Dr. Coralyn Mark for the sensation of being hot. She has had an extensive workup for this as well. No etiology has been determined. She did go off of the Cymbalta to see if that would be of benefit. She has been off of it since end of November and that hasn't changed. No sweating at all. Just feels like she has a "hot flash" without sweating. Not sure that depression got worse going off of it but not sure if deals with things as well but also thinks that had SE with the cymbalta. Pt does think that the mirapex may be causing some of the above issues. She has been having LE swelling and hand edema as well. She states that she has a "brain fog" that she thinks is due to the med. She has a diffuse weakness and an inability to exercise due to SOB. Again, she wonders if it is all due to mirapex.   Current/Previously tried tremor medications: Klonopin  Current medications that may exacerbate tremor: ?Cymbalta.  I reviewed the MRI of the brain done at the end of 2013 with her. This revealed some mild small vessel disease, likely secondary to  hypertension. Otherwise, it was unremarkable. The small vessel disease was not within the basal ganglia. I showed her the films in the office today.  1. Akinetic rigid Parkinson's disease. -I had a long discussion with the patient and her husband today. Much greater than 50% of this 60 minute visit was spent in counseling with the patient and her husband. While Mirapex certainly can cause edema and cognitive dulling, I have not seen it cause shortness of breath previously. I wonder if this is not from deconditioning. Nonetheless, I gave her multiple options. I talked to her about potentially switching her to Requip. However, this is still an agonist drug. I could drop the equivant dose of Requip (perhaps a total of 6 mg), however. I talked to her about levodopa, but she really is well controlled on an agonist drug. In the end, she decided that she would like to hold the Mirapex for about 5 days. I did warn her that her Parkinson symptoms likely will get much worse for the next few days, but it certainly is not going to hurt her on the long-term. She should be able to assess whether or not her edema and cognition are better. She will call me next week and let me know. 2. Fatigue -I really do not think it is from the Parkinson's disease. Extensive cardiopulm w/u, including cardiac cath was negative. We talked about cardiovascular exercise and she is to start cardiopulmonary rehabilitation tomorrow. 3. Depression -  Off cymbalta right now because of feeling of internal "heat" although no sweating. I do think that she may need to go back on something in the future, but she wants to get these other issues worked out first.     02/01/2014 Heat sensation: Her description is not consistent with vasomotor flushes and it is extremely rare to start a perimenopausal flush at age 29 without any prior symptoms. Her parkinson's has been recently diagnosed and  it is difficult to believe that this is a temp dysregulation from dysautonomia. She does not have rash or diarrhea to suggest endocrine etiology or 5-HIAA . Due to strong FH of auto-immune disorder in mother I will Check ANA. Will also check metanephrines for completeness  I have seen SE of Cymbalta cause menopausal heat sensation. She is off this now and will see if this helps  DOE/ Mild OSA/ Restrictive lung disease Extensive neg work up for this With both cardiology and pulmonary . To start pulm rehab soon  Parkinsons Well controlled on MIrapex  Depression apparently pt reports this has been a long standing diagnosis for her . She cannot recall other meds she has been on . I counseled that she should not try Cymbalta.   Hyperlipidemia On statin   See me in 2-3 weeks  I spent 60 mins with pt Extensive counseling         TODAY: Pt is here for follow up consultation regarding sensation of internal heat but no diaphoresis.      Labs are unrevealing and I do not think her symptoms are related to perimenopause.  She has had several med changes  .  She is off Mirapex and taking Requip now.  She is off all BP meds except HCTZ.   She has started Lexapro for her depression  Fortunately her heat sensation has not returned.   She is adjusting to Requip and still has slight tremor.  Had some problem with SOB at night but this has also resolved     Review of Systems See HPI    Objective:   Physical Exam  Physical Exam  Nursing note and vitals reviewed.  Constitutional: She is oriented to person, place, and time. She appears well-developed and well-nourished.  HENT:  Head: Normocephalic and atraumatic.  Cardiovascular: Normal rate and regular rhythm. Exam reveals no gallop and no friction rub.  No murmur heard.  Pulmonary/Chest: Breath sounds normal. She has no wheezes. She has no rales.  Neurological: She is alert and oriented to person, place, and time.  Skin: Skin is  warm and dry.  Psychiatric: She has a normal mood and affect. Her behavior is normal.             Assessment & Plan:  Heat sensation  Better now that she is off Cymbalta  Depression  On lexapro and this is helping  Akinetic PD  Managed by neurology    See me as needed

## 2014-02-23 NOTE — Patient Instructions (Signed)
See me as needed 

## 2014-02-24 ENCOUNTER — Telehealth: Payer: Self-pay | Admitting: Neurology

## 2014-02-24 ENCOUNTER — Encounter (HOSPITAL_COMMUNITY)
Admission: RE | Admit: 2014-02-24 | Discharge: 2014-02-24 | Disposition: A | Discharge status: Home / Self Care | Payer: Medicare Other | Source: Ambulatory Visit | Attending: Pulmonary Disease | Admitting: Pulmonary Disease

## 2014-02-24 DIAGNOSIS — J439 Emphysema, unspecified: Secondary | ICD-10-CM | POA: Diagnosis not present

## 2014-02-24 NOTE — Progress Notes (Signed)
Today, Destiny Roth exercised at [employer]. Service time was from 10:30a to 12:30p.  The patient exercised for more than 31 minutes performing aerobic, strengthening, and stretching exercises. Oxygen saturation, heart rate, blood pressure, rate of perceived exertion, and shortness of breath were all monitored before, during, and after exercise. Destiny Roth presented with no problems at today's exercise session. The patient attended education today with Destiny Roth on Advanced Directives.  There was an increase in workload change during today's exercise session.  Pre-exercise vitals: . Weight kg: 65.4 . Liters of O2: ra . SpO2: 96 . HR: 89 . BP: 120/72 . CBG: na  Exercise vitals: . Highest heartrate:  118 . Lowest oxygen saturation: 94 . Highest blood pressure: 124/70 . Liters of 02: ra  Post-exercise vitals: . SpO2: 96 . HR: 96 . BP: 112/78 . Liters of O2: ra . CBG: na  Dr. Brand Males, Medical Director Dr. Tana Coast is immediately available during today's Pulmonary Rehab session for Destiny Roth on 02/24/14 at the 10:30am class time.

## 2014-02-24 NOTE — Telephone Encounter (Signed)
Pt called/returnign your call at 1:40PM. C/B [phone]

## 2014-02-24 NOTE — Telephone Encounter (Signed)
Pt called wanting to speak to a nurse regarding the new script she is taking. Please call pt # [phone]

## 2014-02-24 NOTE — Telephone Encounter (Signed)
Left message on machine for patient to call back.

## 2014-02-24 NOTE — Telephone Encounter (Signed)
Spoke with patient. She states she is feeling better. She still has some tremor. Still having some trouble with her speech and organizing thoughts. She is requesting 90 day supply of medication if we want to keep her on this dose. How long would you want patient on this dose before increasing/changing? When do you want to see her back in the office? Please advise.

## 2014-02-24 NOTE — Telephone Encounter (Signed)
Can she come in next Friday AM at 10:45 or 11:15 (last of AM) and we can eval?  She have enough med until then?

## 2014-02-25 NOTE — Telephone Encounter (Signed)
Tried to call again with no answer  

## 2014-02-28 NOTE — Telephone Encounter (Signed)
Pt called/returning your call. C/b  [phone]

## 2014-02-28 NOTE — Telephone Encounter (Signed)
Called back and appt made for Friday for evaluation.

## 2014-03-01 ENCOUNTER — Encounter (HOSPITAL_COMMUNITY)
Admission: RE | Admit: 2014-03-01 | Discharge: 2014-03-01 | Disposition: A | Discharge status: Home / Self Care | Payer: Medicare Other | Source: Ambulatory Visit | Attending: Pulmonary Disease | Admitting: Pulmonary Disease

## 2014-03-01 DIAGNOSIS — J439 Emphysema, unspecified: Secondary | ICD-10-CM | POA: Diagnosis not present

## 2014-03-01 NOTE — Progress Notes (Signed)
Today, Destiny Roth exercised at [employer] [facility]. Service time was from 10:30 to 12:00pm.  The patient exercised for more than 31 minutes performing aerobic, strengthening, and stretching exercises. Oxygen saturation, heart rate, blood pressure, rate of perceived exertion, and shortness of breath were all monitored before, during, and after exercise. Destiny Roth presented with no problems at today's exercise session.   There was an increase in workload change during today's exercise session.  Pre-exercise vitals: . Weight kg: 65.2 . Liters of O2: ra . SpO2: 95 . HR: 100 . BP: 134/80 . CBG: na  Exercise vitals: . Highest heartrate:  132 . Lowest oxygen saturation: 94 . Highest blood pressure:  . Liters of 02: ra  Post-exercise vitals: . SpO2: 95 . HR: 107 . BP: 118/78 . Liters of O2: ra . CBG: na  Dr. Brand Males, Medical Director Dr. Tana Coast is immediately available during today's Pulmonary Rehab session for Destiny Roth on 03/01/14 at 10:30am class time.

## 2014-03-03 ENCOUNTER — Encounter (HOSPITAL_COMMUNITY)
Admission: RE | Admit: 2014-03-03 | Discharge: 2014-03-03 | Disposition: A | Discharge status: Home / Self Care | Payer: Medicare Other | Source: Ambulatory Visit | Attending: Pulmonary Disease | Admitting: Pulmonary Disease

## 2014-03-03 DIAGNOSIS — J439 Emphysema, unspecified: Secondary | ICD-10-CM | POA: Diagnosis not present

## 2014-03-03 NOTE — Progress Notes (Signed)
Today, Destiny Roth exercised at [employer] [facility]. Service time was from 10:30a to 12:15p.  The patient exercised for more than 31 minutes performing aerobic, strengthening, and stretching exercises. Oxygen saturation, heart rate, blood pressure, rate of perceived exertion, and shortness of breath were all monitored before, during, and after exercise. Destiny Roth presented with no problems at today's exercise session. The patient attended education today with Respiratory Therapy.   There was no workload change during today's exercise session.  Pre-exercise vitals: . Weight kg: 64.9 . Liters of O2: ra . SpO2: 95 . HR: 98 . BP: 130/74 . CBG: na  Exercise vitals: . Highest heartrate:  140 . Lowest oxygen saturation: 95 . Highest blood pressure: 130/84 . Liters of 02: ra  Post-exercise vitals: . SpO2: 95 . HR: 108 . BP: 110/70 . Liters of O2: ra . CBG: na  Dr. Brand Males, Medical Director Dr. Wyline Copas is immediately available during today's Pulmonary Rehab session for [name] on 03/03/14 at 10:30a class time.

## 2014-03-04 ENCOUNTER — Telehealth: Payer: Self-pay | Admitting: Internal Medicine

## 2014-03-04 ENCOUNTER — Ambulatory Visit (INDEPENDENT_AMBULATORY_CARE_PROVIDER_SITE_OTHER): Payer: Medicare Other | Admitting: Neurology

## 2014-03-04 ENCOUNTER — Encounter: Payer: Self-pay | Admitting: Neurology

## 2014-03-04 VITALS — BP 160/94 | HR 96 | Ht <= 58 in | Wt 140.3 lb

## 2014-03-04 DIAGNOSIS — G20 Parkinson's disease: Secondary | ICD-10-CM

## 2014-03-04 DIAGNOSIS — F330 Major depressive disorder, recurrent, mild: Secondary | ICD-10-CM

## 2014-03-04 MED ORDER — ROPINIROLE HCL 2 MG PO TABS: 2.0000 mg | ORAL_TABLET | Freq: Three times a day (TID) | ORAL | Status: DC

## 2014-03-04 MED ORDER — HYDROCHLOROTHIAZIDE 25 MG PO TABS: 25.0000 mg | ORAL_TABLET | Freq: Every day | ORAL | Status: DC

## 2014-03-04 NOTE — Telephone Encounter (Signed)
Pt called in and is needing a refill on hydrochlorothiazide (HYDRODIURIL) 25 MG tablet [[id number]]   Please sent to   [facility]   Address: [address] [place], [place] [address]  [phone]

## 2014-03-04 NOTE — Progress Notes (Signed)
Subjective:   Destiny Roth was seen in f/u for PD diagnosed 10/2011.  The patient is a 64 y.o. right handed female with a history of tremor.Onset of symptoms was at end of 2012.   She has noted a gradual loss of smell and taste over the last 20 years.  She even had a rhinoplasty and septoplasty in 1993 to see if that would help and it did not.  She can smell almost nothing now.    She is on Mirapex ER 1.5 mg daily.  The patient states that the tremor is virtually gone, with the exception of when she is stressed at work and then she will notice tremor in both legs.  Her walking is much better.  She feels more stable.  .  She does have some dizziness in the early morning and is unsure if that is related to Mirapex, her blood pressure medication or potassium.  No compulsive behaviors have been noted with the Mirapex.  There have been no sleep attacks.  She has not yet started exercising.  She is going to be changing jobs which is stressful for her.  10/12/12 update:  She has had some tremor internally and in the legs.  She notices some word finding trouble.   She has been under increased stress.  Her husband had back surgery 2 weeks ago and she has been the primary caregiver.  In addition, she is between jobs and needs to find a new job.  She would like to see if she can get off her blood pressure medication.  02/08/13 update:  Last visit, I increased the patient's Mirapex to a total of 2.25 mg daily.  The patient states that it definitely helped the tremor.  She almost never sees tremor any more.  She is, however, complaining about weight gain.  Her weight has been stable for many months, but when we looked back she gained weight between her May, 2014 visit and her September, 2014 visit.  She is frustrated that she cannot get that off.  In addition, she complains of some low energy and vaginal dryness.  I did look at her prior medical records since last visit.  She was complaining to her primary care  physician about dizziness and her blood pressure medication was decreased.  She did see Dr. Elsworth Soho since last visit and a split-night sleep study was recommended.  She states that she is scheduled to have foot surgery for a bunion on February 17 2013.  She had a procedure this morning to "break up a calcification" in the right shoulder.  06/07/13 update:  Pt is f/u today.  The records that were made available to me were reviewed since last visit.  Pt had surgery on both feet on separate dates for bunyionectomies.  Had tachycardia after the first and ended up having a holter monitor but HR was normal on the monitor.  Diltiazem was decreased, pindolol was added.  She had a PSG done since last visit and had an AHI of 12.   No CPAP has been recommended at this point.  She also had a R shoulder surgery on April 7 but those records are not available to me.  She does c/o if bending over, she will have lightheadedness but separate from that she has had 2 episodes of vertigo (room spinning).  Only tremor in the R hand if she has to hold out the arm that she has surgery on and then she experiences pain, so she is  not sure if the tremor is related to the pain or the surgery.  She is planning to go to [place] in about 5 weeks and is worried that she will not feel better by then.  Overall, since the arm surgery and feet surgery, she just does not feel back to herself.  She has not been able to exercise.  10/08/13 update:  The patient is following up today regarding her Parkinson's disease.  Last visit, I increased her Mirapex to 3.0 mg daily.  She is also on Cymbalta.  She has been having more fatigue and shortness of breath.  She just had a nuclear stress test done that was normal.  Her left ventricular ejection fraction was 77%.  She denies falls.  No syncope.  She is not sleepy, just fatigued during the day.  Saw Dr. Harrington Challenger today and increasing diltiazem and metoprolol is being decreased.  02/09/14 update:  The records that were  made available to me were reviewed since last visit.  Pt is accompanied by her husband who supplements the history today (never been present before).  Pt is supposed to be on mirapex 1.0 mg tid but has backed down to bid dosing.  I had to change this from the 3.0 mg ER version last visit because of insurance.  She has been to several other providers since last visit and I have discussed cases with several providers.  She has had an extensive cardiopulmonary workup for fatigue and shortness of breath.  She had an echocardiogram, stress Myoview and cardiac catheterization that were all unremarkable.  She does have mild to moderate obstructive sleep apnea syndrome, but refuses CPAP.  She saw Dr. Coralyn Mark for the sensation of being hot.  She has had an extensive workup for this as well.  No etiology has been determined.  She did go off of the Cymbalta to see if that would be of benefit.  She has been off of it since end of November and that hasn't changed.  No sweating at all.  Just feels like she has a "hot flash" without sweating.  Not sure that depression got worse going off of it but not sure if deals with things as well but also thinks that had SE with the cymbalta.  Pt does think that the mirapex may be causing some of the above issues.  She has been having LE swelling and hand edema as well.  She states that she has a "brain fog" that she thinks is due to the med.  She has a diffuse weakness and an inability to exercise due to SOB.  Again, she wonders if it is all due to mirapex.    03/04/14 update:  I held the patients mirapex for a few days and the patient felt that hot flashes and edema were better but not surprisingly, the PD symptoms got much worse.   She was glad that she did get the opportunity to see how she was off of medication but admits that it scared her to see how bad things got.   Decided to start requip and she is on 2 mg tid.  She still has some trouble with fine motor movements.  The records  that were made available to me were reviewed.  She saw Dr. Jenny Reichmann and since she still needed medication for depression after she d/c the cymbalta, she was put on lexapro.  She has only been on that since 02/15/14.  That is helping.  She is in pulmonary rehab and  that is helping greatly.  She has also d/c her antihypertensives except her HCTZ as she still has some peripheral edema.    Current/Previously tried tremor medications: Klonopin  Current medications that may exacerbate tremor:  ?Cymbalta.  I reviewed the MRI of the brain done at the end of 2013 with her.  This revealed some mild small vessel disease, likely secondary to hypertension.  Otherwise, it was unremarkable.  The small vessel disease was not within the basal ganglia.  I showed her the films in the office today.   No Known Allergies  Current Outpatient Prescriptions on File Prior to Visit  Medication Sig Dispense Refill  . albuterol (PROVENTIL HFA;VENTOLIN HFA) 108 (90 BASE) MCG/ACT inhaler Inhale 2 puffs into the lungs every 6 (six) hours as needed for wheezing or shortness of breath. 1 Inhaler 2  . aspirin 81 MG tablet Take 81 mg by mouth daily.    Marland Kitchen atorvastatin (LIPITOR) 20 MG tablet TAKE 1 TABLET DAILY 90 tablet 2  . escitalopram (LEXAPRO) 10 MG tablet Take 1 tablet (10 mg total) by mouth daily. 90 tablet 3  . hydrochlorothiazide (HYDRODIURIL) 25 MG tablet Take 1 tablet (25 mg total) by mouth daily. 90 tablet 4  . KLOR-CON 10 10 MEQ tablet TAKE 1 TABLET DAILY 90 tablet 3  . Multiple Vitamins-Calcium (ONE-A-DAY WOMENS PO) Take 1 tablet by mouth daily.    Marland Kitchen rOPINIRole (REQUIP) 2 MG tablet Take 1 tablet (2 mg total) by mouth 3 (three) times daily. 90 tablet 2   No current facility-administered medications on file prior to visit.    Past Medical History  Diagnosis Date  . GLUCOSE INTOLERANCE 05/24/2008  . HYPERLIPIDEMIA 09/23/2006  . ANXIETY 09/23/2006  . DEPRESSION 09/23/2006  . CARPAL TUNNEL SYNDROME, BILATERAL 09/23/2006  .  HYPERTENSION 09/23/2006  . GERD 05/24/2008  . DIVERTICULOSIS, COLON 09/23/2006  . IBS 09/23/2006  . BUNION, RIGHT FOOT 09/18/2009  . OSTEOPOROSIS 05/24/2008  . Abdominal pain, epigastric 09/18/2009  . Impaired glucose tolerance 11/04/2010  . Anosmia 11/06/2010  . Parkinson's disease   . Restrictive lung disease 12/20/2013    Past Surgical History  Procedure Laterality Date  . Tubal ligation    . Nasal septum repair    . S/p ganglion cyst right wrist    . S/p bilat cts    . S/p right thumb trigger finger surgury    . S/p fibroid ablation    . Esophagogastroduodenoscopy  2008    Dr. Harriette Bouillon duodenal ulcer  . Colonoscopy w/ biopsies  2008    Dr. Collene Mares -negative random bxs  . Cataract extraction    . Shoulder surgery Right 2015  . Foot surgery Bilateral 2015  . Cardiac catheterization    . Left and right heart catheterization with coronary angiogram N/A 11/09/2013    Procedure: LEFT AND RIGHT HEART CATHETERIZATION WITH CORONARY ANGIOGRAM;  Surgeon: Peter M Martinique, MD;  Location: [facility] CATH LAB;  Service: Cardiovascular;  Laterality: N/A;    History   Social History  . Marital Status: Married    Spouse Name: N/A    Number of Children: 2  . Years of Education: N/A   Occupational History  . Not on file.   Social History Main Topics  . Smoking status: Never Smoker   . Smokeless tobacco: Never Used  . Alcohol Use: Yes     Comment: Occ  . Drug Use: No  . Sexual Activity:    Partners: Male    Birth Control/ Protection: Post-menopausal  Other Topics Concern  . Not on file   Social History Narrative    Family Status  Relation Status Death Age  . Mother Deceased     2001-05-09, PVD, [place], [place]  . Father Deceased 13    [place], hypertension since teens  . Child Alive     2, alive and well  . Maternal Grandmother Deceased     PD  . Brother Alive   . Brother Alive   . Brother Alive     Review of Systems A complete 10 system ROS was obtained and was negative apart from what is  mentioned.   Objective:   VITALS:   Filed Vitals:   03/04/14 1058  BP: 160/94  Pulse: 96  Height: 4\' 8"  (1.422 m)  Weight: 140 lb 5 oz (63.645 kg)  SpO2: 97%   Wt Readings from Last 3 Encounters:  03/04/14 140 lb 5 oz (63.645 kg)  02/23/14 144 lb (65.318 kg)  02/15/14 141 lb (63.957 kg)    GEN:  The patient appears stated age (actually younger) and is in NAD. HEENT:  Normocephalic, atraumatic.  The mucous membranes are moist. The superficial temporal arteries are without ropiness or tenderness. CV:  RRR Lungs:  CTAB Neck/HEME:  There are no carotid bruits bilaterally.  Neurological examination:  Orientation: The patient is alert and oriented x3. Fund of knowledge is appropriate.  Recent and remote memory are intact.  Attention and concentration are normal.    Able to name objects and repeat phrases. Cranial nerves: There is good facial symmetry.   Extraocular muscles are intact.  There are no square wave jerks. The speech is fluent and clear. Soft palate rises symmetrically and there is no tongue deviation. Hearing is intact to conversational tone. Sensation: Sensation is intact to light to light touch throughout. Motor: Strength is 5/5 in the bilateral upper and lower extremities.   Shoulder shrug is equal and symmetric.  There is no pronator drift.   Movement examination: Tone: There is slight increased tone in the LUE Abnormal movements: There was no tremor today even with distraction procedures. Coordination:  There is decreased finger taps bilaterally.   Gait and Station:    Her gait had a fairly normal stride length today.  Arm swing was good.  She is stable today.  LABS:  Lab Results  Component Value Date   WBC 6.5 02/01/2014   HGB 14.4 02/01/2014   HCT 42.2 02/01/2014   MCV 86.7 02/01/2014   PLT 343 02/01/2014     Chemistry      Component Value Date/Time   NA 137 12/31/2013 0749   K 3.6 12/31/2013 0749   CL 98 12/31/2013 0749   CO2 26 12/31/2013 0749    BUN 18 12/31/2013 0749   CREATININE 0.8 12/31/2013 0749      Component Value Date/Time   CALCIUM 9.8 12/31/2013 0749   ALKPHOS 75 12/31/2013 0749   AST 40* 12/31/2013 0749   ALT 41* 12/31/2013 0749   BILITOT 0.6 12/31/2013 0749     Lab Results  Component Value Date   VITAMINB12 443 11/26/2011   Lab Results  Component Value Date   FOLATE 13.5 11/26/2011   Lab Results  Component Value Date   TSH 0.895 02/01/2014        Assessment/Plan:   1.  Akinetic rigid Parkinson's disease.  -I had another long discussion with the patient today.  She feels better on the Requip 2 mg 3 times a day.  I  think she is just slightly under dosed.  I think she could use 8 mg of Requip per day, but it would not be wrong to leave her at 6 mg, since she is overall feeling much better than she did on Mirapex.  Ultimately, she decided to stay on Requip 2 mg 3 times per day.  Greater than 50% of the 30 minute visit was in counseling today.  -Invited her to our local Parkinson's conference upcoming in March. 2.  Fatigue  -She is doing better after switching multiple medications including Mirapex to Requip, Cymbalta to Lexapro and discontinuing blood pressure medications.  She is also in pulmonary rehabilitation.  We talked about the importance of cardiovascular exercise outside of rehabilitation. 3.  Depression  -She just started Lexapro and thinks it is working.

## 2014-03-04 NOTE — Telephone Encounter (Signed)
REFILL SENT TO [id number]...Destiny Roth

## 2014-03-08 ENCOUNTER — Encounter (HOSPITAL_COMMUNITY)
Admission: RE | Admit: 2014-03-08 | Discharge: 2014-03-08 | Disposition: A | Discharge status: Home / Self Care | Payer: Medicare Other | Source: Ambulatory Visit | Attending: Pulmonary Disease | Admitting: Pulmonary Disease

## 2014-03-08 DIAGNOSIS — J439 Emphysema, unspecified: Secondary | ICD-10-CM | POA: Diagnosis not present

## 2014-03-08 NOTE — Progress Notes (Signed)
Today, Destiny Roth exercised at [employer]. Service time was from 10:30a to 12:10p.  The patient exercised for more than 31 minutes performing aerobic, strengthening, and stretching exercises. Oxygen saturation, heart rate, blood pressure, rate of perceived exertion, and shortness of breath were all monitored before, during, and after exercise. Destiny Roth presented with no problems at today's exercise session.   There was no workload change during today's exercise session.  Pre-exercise vitals: . Weight kg: 65.3 . Liters of O2: ra . SpO2: 97 . HR: 111 . BP: 116/66 . CBG: na  Exercise vitals: . Highest heartrate:  129 . Lowest oxygen saturation: 94 . Highest blood pressure: 136/80 . Liters of 02: ra  Post-exercise vitals: . SpO2: 95 . HR: 104 . BP: 114/60 . Liters of O2: ra . CBG: na  Dr. Brand Males, Medical Director Dr. Wyline Copas is immediately available during today's Pulmonary Rehab session for Destiny Roth on 03/08/14 at 10:30a class time.

## 2014-03-08 NOTE — Progress Notes (Signed)
I have reviewed a Home Exercise Prescription with [name] . [name] is not currently exercising at home.  The patient was advised to walk 3 days a week for 30-45 minutes.  [name] and I discussed how to progress their exercise prescription.  The patient stated that their goals were to be able to increase exercise endurance.  The patient stated that they understand the exercise prescription.  We reviewed exercise guidelines, target heart rate during exercise, oxygen use, weather, home pulse oximeter, endpoints for exercise, and goals.  Patient is encouraged to come to me with any questions. I will continue to follow up with the patient to assist them with progression and safety.

## 2014-03-10 ENCOUNTER — Encounter (HOSPITAL_COMMUNITY)
Admission: RE | Admit: 2014-03-10 | Discharge: 2014-03-10 | Disposition: A | Discharge status: Home / Self Care | Payer: Medicare Other | Source: Ambulatory Visit | Attending: Pulmonary Disease | Admitting: Pulmonary Disease

## 2014-03-10 DIAGNOSIS — J439 Emphysema, unspecified: Secondary | ICD-10-CM | POA: Diagnosis not present

## 2014-03-10 NOTE — Progress Notes (Signed)
Today, Destiny Roth exercised at [employer]. Service time was from 10:30a to 12:30p.  The patient exercised for more than 31 minutes performing aerobic, strengthening, and stretching exercises. Oxygen saturation, heart rate, blood pressure, rate of perceived exertion, and shortness of breath were all monitored before, during, and after exercise. Destiny Roth presented with no problems at today's exercise session. The patient attended education with [place].  There was no workload change during today's exercise session.  Pre-exercise vitals: . Weight kg: 65.5 . Liters of O2: ra . SpO2: 94 . HR: 84 . BP: 120/64 . CBG: na  Exercise vitals: . Highest heartrate:  125 . Lowest oxygen saturation: 94 . Highest blood pressure: 140/82 . Liters of 02: ra  Post-exercise vitals: . SpO2: 95 . HR: 99 . BP: 122/72 . Liters of O2: ra . CBG: na  Dr. Brand Males, Medical Director Dr. Candiss Norse is immediately available during today's Pulmonary Rehab session for Destiny Roth on 03/10/14 at 10:30a class time.

## 2014-03-15 ENCOUNTER — Encounter (HOSPITAL_COMMUNITY)
Admission: RE | Admit: 2014-03-15 | Discharge: 2014-03-15 | Disposition: A | Discharge status: Home / Self Care | Payer: Medicare Other | Source: Ambulatory Visit | Attending: Pulmonary Disease | Admitting: Pulmonary Disease

## 2014-03-15 DIAGNOSIS — J439 Emphysema, unspecified: Secondary | ICD-10-CM | POA: Diagnosis not present

## 2014-03-15 NOTE — Progress Notes (Signed)
Today, [name] exercised at [employer]. Service time was from 10:30a to 12:25p.  The patient exercised for more than 31 minutes performing aerobic, strengthening, and stretching exercises. Oxygen saturation, heart rate, blood pressure, rate of perceived exertion, and shortness of breath were all monitored before, during, and after exercise. [name] presented with no problems at today's exercise session.   There was no workload change during today's exercise session.  Pre-exercise vitals: . Weight kg: 65.7 . Liters of O2: ra . SpO2: 95 . HR: 85 . BP: 136/70 . CBG: na  Exercise vitals: . Highest heartrate:  114 . Lowest oxygen saturation: 94 . Highest blood pressure: 142/80 . Liters of 02: ra  Post-exercise vitals: . SpO2: 95 . HR: 95 . BP: 116/74 . Liters of O2: ra . CBG: na  Dr. Brand Males, Medical Director Dr. Candiss Norse is immediately available during today's Pulmonary Rehab session for Destiny Roth on 03/15/14 at 10:30am class time.

## 2014-03-17 ENCOUNTER — Encounter (HOSPITAL_COMMUNITY)
Admission: RE | Admit: 2014-03-17 | Discharge: 2014-03-17 | Disposition: A | Discharge status: Home / Self Care | Payer: Medicare Other | Source: Ambulatory Visit | Attending: Pulmonary Disease | Admitting: Pulmonary Disease

## 2014-03-17 DIAGNOSIS — J439 Emphysema, unspecified: Secondary | ICD-10-CM | POA: Diagnosis not present

## 2014-03-17 NOTE — Progress Notes (Signed)
Today, [name] exercised at [employer]. Service time was from 10:30a to 12:15pm.  The patient exercised for more than 31 minutes performing aerobic, strengthening, and stretching exercises. Oxygen saturation, heart rate, blood pressure, rate of perceived exertion, and shortness of breath were all monitored before, during, and after exercise. [name] presented with no problems at today's exercise session.  The patient attended education today on Exercise for the Pulmonary Patient with [facility] Brance Dartt.  There was an increase in workload change during today's exercise session.  Pre-exercise vitals: . Weight kg: 65.1 . Liters of O2: ra . SpO2: 96 . HR: 80 . BP: 128/70 . CBG: na  Exercise vitals: . Highest heartrate:  98 . Lowest oxygen saturation: 96 . Highest blood pressure: 120/70 . Liters of 02: ra  Post-exercise vitals: . SpO2: 96 . HR: 87 . BP: 126/70 . Liters of O2: ra . CBG: na  Dr. Brand Males, Medical Director Dr. Candiss Norse is immediately available during today's Pulmonary Rehab session for [name] on 03/17/14 at 10:30a class time.

## 2014-03-22 ENCOUNTER — Encounter (HOSPITAL_COMMUNITY)
Admission: RE | Admit: 2014-03-22 | Discharge: 2014-03-22 | Disposition: A | Discharge status: Home / Self Care | Payer: Medicare Other | Source: Ambulatory Visit | Attending: Pulmonary Disease | Admitting: Pulmonary Disease

## 2014-03-22 DIAGNOSIS — J439 Emphysema, unspecified: Secondary | ICD-10-CM | POA: Diagnosis not present

## 2014-03-22 NOTE — Progress Notes (Signed)
Today, Destiny Roth exercised at [employer]. Service time was from 10:30am to 12:05pm.  The patient exercised for more than 31 minutes performing aerobic, strengthening, and stretching exercises. Oxygen saturation, heart rate, blood pressure, rate of perceived exertion, and shortness of breath were all monitored before, during, and after exercise. Destiny Roth presented with no problems at today's exercise session.   There was an increase in workload change during today's exercise session.  Pre-exercise vitals: . Weight kg: 65.0 . Liters of O2: ra . SpO2: 97 . HR: 83 . BP: 130/70 . CBG: na  Exercise vitals: . Highest heartrate:  112 . Lowest oxygen saturation: 94 . Highest blood pressure: 124/60 . Liters of 02: ra  Post-exercise vitals: . SpO2: 94 . HR: 88 . BP: 120/76 . Liters of O2: ra . CBG: na  Dr. Brand Males, Medical Director [name] is immediately available during today's Pulmonary Rehab session for Destiny Roth on 03/22/14 at 10:30am class time.

## 2014-03-22 NOTE — Progress Notes (Signed)
Today, Destiny Roth exercised at [employer]. Service time was from 1030 to 1205.  The patient exercised for more than 31 minutes performing aerobic, strengthening, and stretching exercises. Oxygen saturation, heart rate, blood pressure, rate of perceived exertion, and shortness of breath were all monitored before, during, and after exercise. Destiny Roth presented with no problems at today's exercise session.   There was a workload change during today's exercise session.  Pre-exercise vitals: . Weight kg: 65.0 . Liters of O2: ra . SpO2: 97 . HR: 83 . BP: 130/70 . CBG: na  Exercise vitals: . Highest heartrate:  112 . Lowest oxygen saturation: 94 . Highest blood pressure: 124/60 . Liters of 02: ra  Post-exercise vitals: . SpO2: 94 . HR: 88 . BP: 120/76 . Liters of O2: ra . CBG: na  Dr. Brand Males, Medical Director [name] is immediately available during today's Pulmonary Rehab session for [name] on 03/22/2014 at 1030 class time.

## 2014-03-24 ENCOUNTER — Encounter (HOSPITAL_COMMUNITY)
Admission: RE | Admit: 2014-03-24 | Discharge: 2014-03-24 | Disposition: A | Discharge status: Home / Self Care | Payer: Medicare Other | Source: Ambulatory Visit | Attending: Pulmonary Disease | Admitting: Pulmonary Disease

## 2014-03-24 DIAGNOSIS — J439 Emphysema, unspecified: Secondary | ICD-10-CM | POA: Diagnosis not present

## 2014-03-24 NOTE — Progress Notes (Signed)
Today, Destiny Roth exercised at [employer]. Service time was from 10:30am to 12:25pm.  The patient exercised for more than 31 minutes performing aerobic, strengthening, and stretching exercises. Oxygen saturation, heart rate, blood pressure, rate of perceived exertion, and shortness of breath were all monitored before, during, and after exercise. Destiny Roth presented with no problems at today's exercise session. The patient attended education today with [name] on "Signs and Symptoms."  There was no workload change during today's exercise session.  Pre-exercise vitals: . Weight kg: 65.5 . Liters of O2: ra . SpO2: 94 . HR: 85 . BP: 114/76 . CBG: na  Exercise vitals: . Highest heartrate:  106 . Lowest oxygen saturation: 93 . Highest blood pressure: 140/70 . Liters of 02: 2  Post-exercise vitals: . SpO2: 96 . HR: 88 . BP: 124/60 . Liters of O2: ra . CBG: na  Dr. Brand Males, Medical Director Dr. Candiss Norse is immediately available during today's Pulmonary Rehab session for Destiny Roth on 03/24/14 at 10:30am class time.

## 2014-03-29 ENCOUNTER — Encounter (HOSPITAL_COMMUNITY): Payer: Medicare Other

## 2014-03-31 ENCOUNTER — Encounter (HOSPITAL_COMMUNITY)
Admission: RE | Admit: 2014-03-31 | Discharge: 2014-03-31 | Disposition: A | Discharge status: Home / Self Care | Payer: Medicare Other | Source: Ambulatory Visit | Attending: Pulmonary Disease | Admitting: Pulmonary Disease

## 2014-03-31 DIAGNOSIS — J439 Emphysema, unspecified: Secondary | ICD-10-CM | POA: Diagnosis not present

## 2014-03-31 NOTE — Progress Notes (Signed)
Today, Destiny Roth exercised at [employer]. Service time was from 10:30am to 12:40pm.  The patient exercised for more than 31 minutes performing aerobic, strengthening, and stretching exercises. Oxygen saturation, heart rate, blood pressure, rate of perceived exertion, and shortness of breath were all monitored before, during, and after exercise. Destiny Roth presented with no problems at today's exercise session. The patient attended education today with Trish Fountain on Anatomy, Physiology and Intimacy  There was no workload change during today's exercise session.  Pre-exercise vitals: . Weight kg: 65.3 . Liters of O2: ra . SpO2: 96 . HR: 82 . BP: 132/84 . CBG: na  Exercise vitals: . Highest heartrate:  112 . Lowest oxygen saturation: 93 . Highest blood pressure: 132/64 . Liters of 02: 2  Post-exercise vitals: . SpO2: 94 . HR: 92 . BP: 100/60 . Liters of O2: ra . CBG: na  Dr. Brand Males, Medical Director Dr. Waldron Labs is immediately available during today's Pulmonary Rehab session for [name] on 03/31/14 at 10:30am class time.

## 2014-04-05 ENCOUNTER — Encounter (HOSPITAL_COMMUNITY)
Admission: RE | Admit: 2014-04-05 | Discharge: 2014-04-05 | Disposition: A | Discharge status: Home / Self Care | Payer: Medicare Other | Source: Ambulatory Visit | Attending: Pulmonary Disease | Admitting: Pulmonary Disease

## 2014-04-05 DIAGNOSIS — J439 Emphysema, unspecified: Secondary | ICD-10-CM | POA: Diagnosis not present

## 2014-04-05 NOTE — Progress Notes (Signed)
Today, [name] exercised at [employer]. Service time was from 10:30am to 12:10pm.  The patient exercised for more than 31 minutes performing aerobic, strengthening, and stretching exercises. Oxygen saturation, heart rate, blood pressure, rate of perceived exertion, and shortness of breath were all monitored before, during, and after exercise. [name] presented with no problems at today's exercise session.   There was no workload change during today's exercise session.  Pre-exercise vitals: . Weight kg: 65.3 . Liters of O2: ra . SpO2: 97 . HR: 85 . BP: 136/64 . CBG: na  Exercise vitals: . Highest heartrate:  109 . Lowest oxygen saturation: 97 . Highest blood pressure: 114/60 . Liters of 02: ra  Post-exercise vitals: . SpO2: 96 . HR: 88 . BP: 116/64 . Liters of O2: ra . CBG: na  Dr. Brand Males, Medical Director Dr. Waldron Labs is immediately available during today's Pulmonary Rehab session for [name] on 04/05/14 at 10:30am class time.

## 2014-04-07 ENCOUNTER — Encounter (HOSPITAL_COMMUNITY)
Admission: RE | Admit: 2014-04-07 | Discharge: 2014-04-07 | Disposition: A | Discharge status: Home / Self Care | Payer: Medicare Other | Source: Ambulatory Visit | Attending: Pulmonary Disease | Admitting: Pulmonary Disease

## 2014-04-07 DIAGNOSIS — J439 Emphysema, unspecified: Secondary | ICD-10-CM | POA: Diagnosis not present

## 2014-04-07 NOTE — Progress Notes (Signed)
Today, Destiny Roth exercised at [facility]. Service time was from 10:30am to 12:15p.  The patient exercised for more than 31 minutes performing aerobic, strengthening, and stretching exercises. Oxygen saturation, heart rate, blood pressure, rate of perceived exertion, and shortness of breath were all monitored before, during, and after exercise. Destiny Roth presented with no problems at today's exercise session.  The patient attended education class today with the pharmacist on Pulmonary Medicines.   There was an increase in workload change during today's exercise session.  Pre-exercise vitals: . Weight kg: 65.8 . Liters of O2: ra . SpO2: 95 . HR: 82 . BP: 116/72 . CBG: na  Exercise vitals: . Highest heartrate:  105 . Lowest oxygen saturation: 94 . Highest blood pressure: 114/60 . Liters of 02: ra  Post-exercise vitals: . SpO2: 96 . HR: 87 . BP: 142/86 . Liters of O2: ra . CBG: na  Dr. Brand Males, Medical Director Dr. Waldron Labs is immediately available during today's Pulmonary Rehab session for Destiny Roth on 04/07/14 at 10:30am class time.

## 2014-04-12 ENCOUNTER — Encounter (HOSPITAL_COMMUNITY)
Admission: RE | Admit: 2014-04-12 | Discharge: 2014-04-12 | Disposition: A | Discharge status: Home / Self Care | Payer: Medicare Other | Source: Ambulatory Visit | Attending: Pulmonary Disease | Admitting: Pulmonary Disease

## 2014-04-12 DIAGNOSIS — J439 Emphysema, unspecified: Secondary | ICD-10-CM | POA: Diagnosis not present

## 2014-04-12 NOTE — Progress Notes (Signed)
Today, Destiny Roth exercised at [facility]. Service time was from 1030 to 1230.  The patient exercised for more than 31 minutes performing aerobic, strengthening, and stretching exercises. Oxygen saturation, heart rate, blood pressure, rate of perceived exertion, and shortness of breath were all monitored before, during, and after exercise. Destiny Roth presented with no problems at today's exercise session.   There was a workload change during today's exercise session.  Pre-exercise vitals: . Weight kg: 65.8 . Liters of O2: ra . SpO2: 96 . HR: 77 . BP: 148/82 . CBG: na  Exercise vitals: . Highest heartrate:  109 . Lowest oxygen saturation: 95 . Highest blood pressure: 128/80 . Liters of 02: ra  Post-exercise vitals: . SpO2: 95 . HR: 85 . BP: 126/64 . Liters of O2: ra . CBG: na  Dr. Brand Males, Medical Director Dr. Charlies Silvers is immediately available during today's Pulmonary Rehab session for Destiny Roth on 04/12/2014 at 1030 class time.

## 2014-04-14 ENCOUNTER — Encounter (HOSPITAL_COMMUNITY)
Admission: RE | Admit: 2014-04-14 | Discharge: 2014-04-14 | Disposition: A | Discharge status: Home / Self Care | Payer: Medicare Other | Source: Ambulatory Visit | Attending: Pulmonary Disease | Admitting: Pulmonary Disease

## 2014-04-14 DIAGNOSIS — J439 Emphysema, unspecified: Secondary | ICD-10-CM | POA: Diagnosis not present

## 2014-04-14 NOTE — Progress Notes (Signed)
Today, Destiny Roth exercised at [facility]. Service time was from 1030 to 1245.  The patient exercised for more than 31 minutes performing aerobic, strengthening, and stretching exercises. Oxygen saturation, heart rate, blood pressure, rate of perceived exertion, and shortness of breath were all monitored before, during, and after exercise. Destiny Roth presented with no problems at today's exercise session. Destiny Roth also attended an education session with RD on nutrition for the pulmonary patient.  There was no workload change during today's exercise session.  Pre-exercise vitals: . Weight kg: 65.5 . Liters of O2: ra . SpO2: 98 . HR: 82 . BP: 114/70 . CBG: na  Exercise vitals: . Highest heartrate:  100 . Lowest oxygen saturation: 96 . Highest blood pressure: 126/82 . Liters of 02: ra  Post-exercise vitals: . SpO2: 96 . HR: 87 . BP: 124/74 . Liters of O2: ra . CBG: na  Dr. Brand Males, Medical Director Dr. Frederic Jericho is immediately available during today's Pulmonary Rehab session for Destiny Roth on 04/14/2014 at 1030 class time.

## 2014-04-19 ENCOUNTER — Encounter (HOSPITAL_COMMUNITY)
Admission: RE | Admit: 2014-04-19 | Discharge: 2014-04-19 | Disposition: A | Discharge status: Home / Self Care | Payer: Medicare Other | Source: Ambulatory Visit | Attending: Pulmonary Disease | Admitting: Pulmonary Disease

## 2014-04-19 DIAGNOSIS — J439 Emphysema, unspecified: Secondary | ICD-10-CM | POA: Diagnosis not present

## 2014-04-19 NOTE — Progress Notes (Signed)
Today, Destiny Roth exercised at [facility]. Service time was from 10:30am to 12:25pm.  The patient exercised by preforming aerobic, strengthening, and stretching exercises. Oxygen saturation, heart rate, blood pressure, rate of perceived exertion, and shortness of breath were all monitored before, during, and after exercise. Destiny Roth presented with no problems at today's exercise session. Patient attended education today on Pursed Lip and Diaphragmatic Breathing.  Pre-exercise vitals: . Weight kg: 65.3 . Liters of O2: ra . SpO2: 98 . HR: 83 . BP: 110/60 . CBG: na  Exercise vitals: . Highest heartrate:  109 . Lowest oxygen saturation: 92 . Highest blood pressure: 122/70 . Liters of 02: ra  Post-exercise vitals: . SpO2: 97 . HR: 91 . BP: 118/80 . Liters of O2: ra . CBG:  na  Dr. Brand Males, Medical Director Dr. Frederic Jericho is immediately available during today's Pulmonary Rehab session for Destiny Roth on 04/19/14 at 10:30am class time.

## 2014-04-21 ENCOUNTER — Encounter (HOSPITAL_COMMUNITY): Payer: Medicare Other

## 2014-04-26 ENCOUNTER — Encounter (HOSPITAL_COMMUNITY)
Admission: RE | Admit: 2014-04-26 | Discharge: 2014-04-26 | Disposition: A | Discharge status: Home / Self Care | Payer: Medicare Other | Source: Ambulatory Visit | Attending: Pulmonary Disease | Admitting: Pulmonary Disease

## 2014-04-26 DIAGNOSIS — J439 Emphysema, unspecified: Secondary | ICD-10-CM | POA: Diagnosis not present

## 2014-04-26 NOTE — Progress Notes (Signed)
Today, Destiny Roth exercised at [facility]. Service time was from 10:30am to 12:05pm.  The patient exercised by performing aerobic, strengthening, and stretching exercises. Oxygen saturation, heart rate, blood pressure, rate of perceived exertion, and shortness of breath were all monitored before, during, and after exercise. Destiny Roth presented with no problems at today's exercise session.  The patient did have an increase in workload intensity during today's exercise session.  Pre-exercise vitals: . Weight kg: 65.4 . Liters of O2: ra . SpO2: 95 . HR: 79 . BP: 122/80 . CBG: na  Exercise vitals: . Highest heartrate:  107 . Lowest oxygen saturation: 95 . Highest blood pressure: 118/80 . Liters of 02: ra  Post-exercise vitals: . SpO2: 95 . HR: 93 . BP: 124/72 . Liters of O2: ra . CBG: na  Dr. Brand Males, Medical Director Dr. Sheran Fava is immediately available during today's Pulmonary Rehab session for Destiny Roth on 04/26/14 at 10:30am class time.

## 2014-04-28 ENCOUNTER — Encounter (HOSPITAL_COMMUNITY)
Admission: RE | Admit: 2014-04-28 | Discharge: 2014-04-28 | Disposition: A | Discharge status: Home / Self Care | Payer: Medicare Other | Source: Ambulatory Visit | Attending: Pulmonary Disease | Admitting: Pulmonary Disease

## 2014-04-28 DIAGNOSIS — J439 Emphysema, unspecified: Secondary | ICD-10-CM | POA: Diagnosis not present

## 2014-04-28 NOTE — Progress Notes (Signed)
Today, Destiny Roth exercised at [facility]. Service time was from 1115 to 1330.  The patient exercised by performing aerobic, strengthening, and stretching exercises. Oxygen saturation, heart rate, blood pressure, rate of perceived exertion, and shortness of breath were all monitored before, during, and after exercise. Destiny Roth presented with no problems at today's exercise session.Destiny Roth also attended a Q and A session with MD.  The patient did not have an increase in workload intensity during today's exercise session.  Pre-exercise vitals: . Weight kg: 65.5 . Liters of O2: ra . SpO2: 96 . HR: 79 . BP: 126/68 . CBG: na  Exercise vitals: . Highest heartrate:  105 . Lowest oxygen saturation: 94 . Highest blood pressure: 134/86 . Liters of 02: ra  Post-exercise vitals: . SpO2: 94 . HR: 88 . BP: 108/70 . Liters of O2: ra . CBG: na  Dr. Brand Males, Medical Director Dr. Aileen Fass is immediately available during today's Pulmonary Rehab session for Destiny Roth on 04/28/2014 at 1115 class time.

## 2014-05-03 ENCOUNTER — Encounter (HOSPITAL_COMMUNITY)
Admission: RE | Admit: 2014-05-03 | Discharge: 2014-05-03 | Disposition: A | Discharge status: Home / Self Care | Payer: Medicare Other | Source: Ambulatory Visit | Attending: Pulmonary Disease | Admitting: Pulmonary Disease

## 2014-05-03 DIAGNOSIS — J439 Emphysema, unspecified: Secondary | ICD-10-CM | POA: Insufficient documentation

## 2014-05-03 NOTE — Progress Notes (Signed)
Today, Destiny Roth exercised at [facility]. Service time was from 10:30am to 12:10pm.  The patient exercised by performing aerobic, strengthening, and stretching exercises. Oxygen saturation, heart rate, blood pressure, rate of perceived exertion, and shortness of breath were all monitored before, during, and after exercise. Destiny Roth presented with no problems at today's exercise session.  The patient did not have an increase in workload intensity during today's exercise session.  Pre-exercise vitals: . Weight kg: 65.8 . Liters of O2: ra . SpO2: 99 . HR: 85 . BP: 116/70 . CBG: na  Exercise vitals: . Highest heartrate:  114 . Lowest oxygen saturation: 94 . Highest blood pressure: 130/82 . Liters of 02: ra  Post-exercise vitals: . SpO2: 96 . HR: 87 . BP: 110/80 . Liters of O2: ra . CBG: na  Dr. Brand Males, Medical Director Dr. Clementeen Graham is immediately available during today's Pulmonary Rehab session for Destiny Roth on 05/03/14 at 10:30am class time.

## 2014-05-05 ENCOUNTER — Telehealth: Payer: Self-pay | Admitting: Neurology

## 2014-05-05 ENCOUNTER — Encounter (HOSPITAL_COMMUNITY)
Admission: RE | Admit: 2014-05-05 | Discharge: 2014-05-05 | Disposition: A | Discharge status: Home / Self Care | Payer: Medicare Other | Source: Ambulatory Visit | Attending: Pulmonary Disease | Admitting: Pulmonary Disease

## 2014-05-05 DIAGNOSIS — J439 Emphysema, unspecified: Secondary | ICD-10-CM | POA: Diagnosis not present

## 2014-05-05 NOTE — Progress Notes (Signed)
Today, Destiny Roth exercised at [facility]. Service time was from 10:30am to 12:30pm.  The patient exercised by performing aerobic, strengthening, and stretching exercises. Oxygen saturation, heart rate, blood pressure, rate of perceived exertion, and shortness of breath were all monitored before, during, and after exercise. Destiny Roth presented with no problems at today's exercise session.The patient attended education today with Trish Fountain on Oxygen Use and Safety.  The patient did not have an increase in workload intensity during today's exercise session.  Pre-exercise vitals: . Weight kg: 65.5 . Liters of O2: ra . SpO2: 93 . HR: 81 . BP: 128/70 . CBG: na  Exercise vitals: . Highest heartrate:  96 . Lowest oxygen saturation: 96 . Highest blood pressure:  . Liters of 02: ra  Post-exercise vitals: . SpO2: 97 . HR: 94 . BP: 122/80 . Liters of O2: ra . CBG: na  Dr. Brand Males, Medical Director Dr. Clementeen Graham is immediately available during today's Pulmonary Rehab session for Destiny Roth on 05/05/14 at 10:30am class time.

## 2014-05-05 NOTE — Telephone Encounter (Signed)
Patient made aware to increase Requip. She will try this and let us know how she is doing in a week.

## 2014-05-05 NOTE — Telephone Encounter (Signed)
How should she increase the medication? (1 tablet four times daily, etc.)

## 2014-05-05 NOTE — Telephone Encounter (Signed)
Spoke with patient and she states for the last two weeks she has not been feeling well. She states she feels almost as bad as when she went off medication completely in January. She states she feels "internal trembles" and "just a state of not feeling well that is hard to explain." Made her aware I can not make her an appt but I would get Dr Doristine Devoid advise. She states she doesn't think Requip has ever worked as well as Mirapex did. Please advise.

## 2014-05-05 NOTE — Telephone Encounter (Signed)
She can do that or she can take 4 mg in the AM, followed by 2 mg and 2 mg

## 2014-05-05 NOTE — Telephone Encounter (Signed)
See previous note.  Increase requip to 8 mg daily (she wanted to hold on that last time)

## 2014-05-05 NOTE — Telephone Encounter (Signed)
-----   Message from [place], [place] sent at 05/05/2014 10:03 AM EDT ----- Patient states that she does not feel well this morning and has been very shaky for the past week patient does not think her medication is working she would like to be seen today if possible please advise Call back number [phone]

## 2014-05-10 ENCOUNTER — Encounter (HOSPITAL_COMMUNITY)
Admission: RE | Admit: 2014-05-10 | Discharge: 2014-05-10 | Disposition: A | Discharge status: Home / Self Care | Payer: Medicare Other | Source: Ambulatory Visit | Attending: Pulmonary Disease | Admitting: Pulmonary Disease

## 2014-05-10 DIAGNOSIS — J439 Emphysema, unspecified: Secondary | ICD-10-CM | POA: Diagnosis not present

## 2014-05-10 NOTE — Progress Notes (Signed)
Today, [name] exercised at [facility]. Service time was from 10:30am to 12:10pm.  The patient exercised by performing aerobic, strengthening, and stretching exercises. Oxygen saturation, heart rate, blood pressure, rate of perceived exertion, and shortness of breath were all monitored before, during, and after exercise. [name] presented with no problems at today's exercise session.  The patient did not have an increase in workload intensity during today's exercise session.  Pre-exercise vitals: . Weight kg: 66.1 . Liters of O2: ra . SpO2: 95 . HR: 86 . BP: 126/80 . CBG: na  Exercise vitals: . Highest heartrate:  114 . Lowest oxygen saturation: 95 . Highest blood pressure: 110/68 . Liters of 02: ra  Post-exercise vitals: . SpO2: 93 . HR: 93 . BP: 118/82 . Liters of O2: ra . CBG: na  Dr. Brand Males, Medical Director Dr. Clementeen Graham is immediately available during today's Pulmonary Rehab session for Destiny Roth on 05/10/14 at 10:30am class time.

## 2014-05-11 ENCOUNTER — Telehealth: Payer: Self-pay | Admitting: Neurology

## 2014-05-11 ENCOUNTER — Other Ambulatory Visit: Payer: Self-pay | Admitting: *Deleted

## 2014-05-11 MED ORDER — ATORVASTATIN CALCIUM 20 MG PO TABS: 20.0000 mg | ORAL_TABLET | Freq: Every day | ORAL | Status: DC

## 2014-05-11 MED ORDER — METOPROLOL TARTRATE 25 MG PO TABS: 25.0000 mg | ORAL_TABLET | Freq: Two times a day (BID) | ORAL | Status: DC

## 2014-05-11 MED ORDER — POTASSIUM CHLORIDE ER 10 MEQ PO TBCR: 10.0000 meq | EXTENDED_RELEASE_TABLET | Freq: Every day | ORAL | Status: DC

## 2014-05-11 NOTE — Telephone Encounter (Signed)
Pt stated that she has change insurance no longer uses [facility] needing new rx sent to [email or web address] for her metoprolol, klor con, and atorvastatin. inform pt will send...Johny Chess

## 2014-05-11 NOTE — Telephone Encounter (Signed)
Maybe she can come in early Friday AM to discuss

## 2014-05-11 NOTE — Telephone Encounter (Signed)
Spoke with patient after her increase in Requip. She still is not feeling well. She wants to know if 1) there is another medication that can work better for her? 2) How much of her symptoms should she expect to still have while on medication?  She is still having "internal tremors" and her dexterity is off (while brushing teeth, etc.) She just wants to know what she should be expecting. Please advise.

## 2014-05-11 NOTE — Telephone Encounter (Signed)
Pt called wanting to speak with a nurse regarding her script for ROPINIROLE 2mg . C/b [phone]

## 2014-05-11 NOTE — Telephone Encounter (Signed)
Appt made for Friday  °

## 2014-05-12 ENCOUNTER — Encounter (HOSPITAL_COMMUNITY)
Admission: RE | Admit: 2014-05-12 | Discharge: 2014-05-12 | Disposition: A | Discharge status: Home / Self Care | Payer: Medicare Other | Source: Ambulatory Visit | Attending: Pulmonary Disease | Admitting: Pulmonary Disease

## 2014-05-12 DIAGNOSIS — J439 Emphysema, unspecified: Secondary | ICD-10-CM | POA: Diagnosis not present

## 2014-05-12 NOTE — Progress Notes (Signed)
[place] 64 y.o. female Nutrition Note Spoke with pt. Pt is obese.  Making healthy food choices the majority of the time.  Pt's Rate Your Plate results reviewed with pt. Pt avoids salty food; rarely uses canned/ convenience food. Pt does not add salt to food. The role of sodium in lung disease reviewed with pt. Pt wants to lose wt and is frustrated with her wt. Wt loss tips discussed. Pt expressed understanding of the information reviewed.   Nutrition Diagnosis ? Obesity related to excessive energy intake as evidenced by a BMI of 32.2 ?  Nutrition Rx/Est. Daily Nutrition Needs for: ? wt loss 1200-1300 Kcal  55-65 gm protein   1500 mg or less sodium      Nutrition Intervention ? Pt's individual nutrition plan and goals reviewed with pt. ? Benefits of adopting healthy eating habits discussed when pt's Rate Your Plate reviewed. ? Pt to attend the Nutrition and Lung Disease class - met; 04/14/14 ? Handouts given for 5-day, 1200 kcal menu ideas ? Continual client-centered nutrition education by RD, as part of interdisciplinary care. Goal(s) 1. Identify food quantities necessary to achieve wt loss at graduation from pulmonary rehab. 2. Describe the benefit of including fruits, vegetables, whole grains, and low-fat dairy products in a healthy meal plan. Monitor and Evaluate progress toward nutrition goal with team.   Derek Mound, M.Ed, RD, LDN, CDE 05/12/2014 1:27 PM

## 2014-05-12 NOTE — Progress Notes (Signed)
Today, [name] exercised at [facility]. Service time was from 10:30a to 12:20p.  The patient exercised by performing aerobic, strengthening, and stretching exercises. Oxygen saturation, heart rate, blood pressure, rate of perceived exertion, and shortness of breath were all monitored before, during, and after exercise. [name] presented with no problems at today's exercise session. The patient attended class with the Respiratory Therapist today.  The patient did not have an increase in workload intensity during today's exercise session.  Pre-exercise vitals: . Weight kg: 66.2 . Liters of O2: ra . SpO2: 95 . HR: 83 . BP: 122/70 . CBG: na  Exercise vitals: . Highest heartrate:  80 . Lowest oxygen saturation: 95 . Highest blood pressure: 134/74 . Liters of 02: ra  Post-exercise vitals: . SpO2: 94 . HR: 73 . BP: 130/70 . Liters of O2: ra . CBG: na  Dr. Brand Males, Medical Director Dr. Wyline Copas is immediately available during today's Pulmonary Rehab session for Destiny Roth on 05/12/14 at 10:30am class time.

## 2014-05-13 ENCOUNTER — Ambulatory Visit (INDEPENDENT_AMBULATORY_CARE_PROVIDER_SITE_OTHER): Payer: Medicare Other | Admitting: Neurology

## 2014-05-13 ENCOUNTER — Encounter: Payer: Self-pay | Admitting: Neurology

## 2014-05-13 VITALS — BP 138/80 | HR 88 | Ht <= 58 in | Wt 145.0 lb

## 2014-05-13 DIAGNOSIS — R635 Abnormal weight gain: Secondary | ICD-10-CM | POA: Diagnosis not present

## 2014-05-13 DIAGNOSIS — G20 Parkinson's disease: Secondary | ICD-10-CM

## 2014-05-13 DIAGNOSIS — F330 Major depressive disorder, recurrent, mild: Secondary | ICD-10-CM

## 2014-05-13 MED ORDER — CARBIDOPA-LEVODOPA 25-100 MG PO TABS: 1.0000 | ORAL_TABLET | Freq: Three times a day (TID) | ORAL | Status: DC

## 2014-05-13 MED ORDER — ROPINIROLE HCL 2 MG PO TABS: ORAL_TABLET | ORAL | Status: DC

## 2014-05-13 NOTE — Progress Notes (Signed)
Subjective:   Destiny Roth was seen in f/u for PD diagnosed 10/2011.  The patient is a 64 y.o. right handed female with a history of tremor.Onset of symptoms was at end of 2012.   She has noted a gradual loss of smell and taste over the last 20 years.  She even had a rhinoplasty and septoplasty in 1993 to see if that would help and it did not.  She can smell almost nothing now.    She is on Mirapex ER 1.5 mg daily.  The patient states that the tremor is virtually gone, with the exception of when she is stressed at work and then she will notice tremor in both legs.  Her walking is much better.  She feels more stable.  .  She does have some dizziness in the early morning and is unsure if that is related to Mirapex, her blood pressure medication or potassium.  No compulsive behaviors have been noted with the Mirapex.  There have been no sleep attacks.  She has not yet started exercising.  She is going to be changing jobs which is stressful for her.  10/12/12 update:  She has had some tremor internally and in the legs.  She notices some word finding trouble.   She has been under increased stress.  Her husband had back surgery 2 weeks ago and she has been the primary caregiver.  In addition, she is between jobs and needs to find a new job.  She would like to see if she can get off her blood pressure medication.  02/08/13 update:  Last visit, I increased the patient's Mirapex to a total of 2.25 mg daily.  The patient states that it definitely helped the tremor.  She almost never sees tremor any more.  She is, however, complaining about weight gain.  Her weight has been stable for many months, but when we looked back she gained weight between her May, 2014 visit and her September, 2014 visit.  She is frustrated that she cannot get that off.  In addition, she complains of some low energy and vaginal dryness.  I did look at her prior medical records since last visit.  She was complaining to her primary care  physician about dizziness and her blood pressure medication was decreased.  She did see Dr. Elsworth Soho since last visit and a split-night sleep study was recommended.  She states that she is scheduled to have foot surgery for a bunion on February 17 2013.  She had a procedure this morning to "break up a calcification" in the right shoulder.  06/07/13 update:  Pt is f/u today.  The records that were made available to me were reviewed since last visit.  Pt had surgery on both feet on separate dates for bunyionectomies.  Had tachycardia after the first and ended up having a holter monitor but HR was normal on the monitor.  Diltiazem was decreased, pindolol was added.  She had a PSG done since last visit and had an AHI of 12.   No CPAP has been recommended at this point.  She also had a R shoulder surgery on April 7 but those records are not available to me.  She does c/o if bending over, she will have lightheadedness but separate from that she has had 2 episodes of vertigo (room spinning).  Only tremor in the R hand if she has to hold out the arm that she has surgery on and then she experiences pain, so she is  not sure if the tremor is related to the pain or the surgery.  She is planning to go to [place] in about 5 weeks and is worried that she will not feel better by then.  Overall, since the arm surgery and feet surgery, she just does not feel back to herself.  She has not been able to exercise.  10/08/13 update:  The patient is following up today regarding her Parkinson's disease.  Last visit, I increased her Mirapex to 3.0 mg daily.  She is also on Cymbalta.  She has been having more fatigue and shortness of breath.  She just had a nuclear stress test done that was normal.  Her left ventricular ejection fraction was 77%.  She denies falls.  No syncope.  She is not sleepy, just fatigued during the day.  Saw Dr. Harrington Challenger today and increasing diltiazem and metoprolol is being decreased.  02/09/14 update:  The records that were  made available to me were reviewed since last visit.  Pt is accompanied by her husband who supplements the history today (never been present before).  Pt is supposed to be on mirapex 1.0 mg tid but has backed down to bid dosing.  I had to change this from the 3.0 mg ER version last visit because of insurance.  She has been to several other providers since last visit and I have discussed cases with several providers.  She has had an extensive cardiopulmonary workup for fatigue and shortness of breath.  She had an echocardiogram, stress Myoview and cardiac catheterization that were all unremarkable.  She does have mild to moderate obstructive sleep apnea syndrome, but refuses CPAP.  She saw Dr. Coralyn Mark for the sensation of being hot.  She has had an extensive workup for this as well.  No etiology has been determined.  She did go off of the Cymbalta to see if that would be of benefit.  She has been off of it since end of November and that hasn't changed.  No sweating at all.  Just feels like she has a "hot flash" without sweating.  Not sure that depression got worse going off of it but not sure if deals with things as well but also thinks that had SE with the cymbalta.  Pt does think that the mirapex may be causing some of the above issues.  She has been having LE swelling and hand edema as well.  She states that she has a "brain fog" that she thinks is due to the med.  She has a diffuse weakness and an inability to exercise due to SOB.  Again, she wonders if it is all due to mirapex.    03/04/14 update:  I held the patients mirapex for a few days and the patient felt that hot flashes and edema were better but not surprisingly, the PD symptoms got much worse.   She was glad that she did get the opportunity to see how she was off of medication but admits that it scared her to see how bad things got.   Decided to start requip and she is on 2 mg tid.  She still has some trouble with fine motor movements.  The records  that were made available to me were reviewed.  She saw Dr. Jenny Reichmann and since she still needed medication for depression after she d/c the cymbalta, she was put on lexapro.  She has only been on that since 02/15/14.  That is helping.  She is in pulmonary rehab and  that is helping greatly.  She has also d/c her antihypertensives except her HCTZ as she still has some peripheral edema.    05/13/14 update:  The patient is following up today a little bit sooner than expected.  Last visit, she was on Requip, 2 mg 3 times a day and she did not want to increase the dosage, even though she was slightly underdosed.  She did call me on April 7 complaining of increased rigidity and we decided to increase her dosage to 4 mg in the morning, 2 mg in the afternoon and 2 mg in the evening.  She called me just a few days ago to tell me that she still did not feel well and that is why she is following up.  Pt states that the increase may have helped somewhat but not fully.  She states that "I want to know that I am on the best medication for me."  Every few days, "I feel like I am going into a crisis."  She will have difficulty with washing her hair, tremor, manipulating her hands.  She has other issues that she is unsure are unrelated to PD.  She has "IBS" with diarrhea up to 15 times a day.  She has pain on the bottom of the feet.  She had a formal easter dinner at her house and spent all of the previous Friday (13 hours) preparing as she knew it would take that long.  She c/o swelling and weight gain  Current/Previously tried tremor medications: Klonopin  Current medications that may exacerbate tremor:  ?Cymbalta.  I reviewed the MRI of the brain done at the end of 2013 with her.  This revealed some mild small vessel disease, likely secondary to hypertension.  Otherwise, it was unremarkable.  The small vessel disease was not within the basal ganglia.  I showed her the films in the office today.   No Known Allergies  Current  Outpatient Prescriptions on File Prior to Visit  Medication Sig Dispense Refill  . albuterol (PROVENTIL HFA;VENTOLIN HFA) 108 (90 BASE) MCG/ACT inhaler Inhale 2 puffs into the lungs every 6 (six) hours as needed for wheezing or shortness of breath. 1 Inhaler 2  . aspirin 81 MG tablet Take 81 mg by mouth daily.    Marland Kitchen atorvastatin (LIPITOR) 20 MG tablet Take 1 tablet (20 mg total) by mouth daily. 90 tablet 3  . diclofenac (VOLTAREN) 75 MG EC tablet     . escitalopram (LEXAPRO) 10 MG tablet Take 1 tablet (10 mg total) by mouth daily. 90 tablet 3  . hydrochlorothiazide (HYDRODIURIL) 25 MG tablet Take 1 tablet (25 mg total) by mouth daily. 90 tablet 3  . metoprolol tartrate (LOPRESSOR) 25 MG tablet Take 1 tablet (25 mg total) by mouth 2 (two) times daily. 180 tablet 3  . Multiple Vitamins-Calcium (ONE-A-DAY WOMENS PO) Take 1 tablet by mouth daily.    . potassium chloride (KLOR-CON 10) 10 MEQ tablet Take 1 tablet (10 mEq total) by mouth daily. 90 tablet 3   No current facility-administered medications on file prior to visit.    Past Medical History  Diagnosis Date  . GLUCOSE INTOLERANCE 05/24/2008  . HYPERLIPIDEMIA 09/23/2006  . ANXIETY 09/23/2006  . DEPRESSION 09/23/2006  . CARPAL TUNNEL SYNDROME, BILATERAL 09/23/2006  . HYPERTENSION 09/23/2006  . GERD 05/24/2008  . DIVERTICULOSIS, COLON 09/23/2006  . IBS 09/23/2006  . BUNION, RIGHT FOOT 09/18/2009  . OSTEOPOROSIS 05/24/2008  . Abdominal pain, epigastric 09/18/2009  . Impaired glucose tolerance  11/04/2010  . Anosmia 11/06/2010  . Parkinson's disease   . Restrictive lung disease 12/20/2013    Past Surgical History  Procedure Laterality Date  . Tubal ligation    . Nasal septum repair    . S/p ganglion cyst right wrist    . S/p bilat cts    . S/p right thumb trigger finger surgury    . S/p fibroid ablation    . Esophagogastroduodenoscopy  05/12/06    Dr. Harriette Bouillon duodenal ulcer  . Colonoscopy w/ biopsies  05/12/2006    Dr. Collene Mares -negative random bxs   . Cataract extraction    . Shoulder surgery Right 2013-05-11  . Foot surgery Bilateral 2013-05-11  . Cardiac catheterization    . Left and right heart catheterization with coronary angiogram N/A 11/09/2013    Procedure: LEFT AND RIGHT HEART CATHETERIZATION WITH CORONARY ANGIOGRAM;  Surgeon: Peter M Martinique, MD;  Location: [facility] CATH LAB;  Service: Cardiovascular;  Laterality: N/A;    History   Social History  . Marital Status: Married    Spouse Name: N/A  . Number of Children: 2  . Years of Education: N/A   Occupational History  . Not on file.   Social History Main Topics  . Smoking status: Never Smoker   . Smokeless tobacco: Never Used  . Alcohol Use: Yes     Comment: Occ  . Drug Use: No  . Sexual Activity:    Partners: Male    Birth Control/ Protection: Post-menopausal   Other Topics Concern  . Not on file   Social History Narrative    Family Status  Relation Status Death Age  . Mother Deceased     May 11, 2001, PVD, [place], [place]  . Father Deceased 5    [place], hypertension since teens  . Child Alive     2, alive and well  . Maternal Grandmother Deceased     PD  . Brother Alive   . Brother Alive   . Brother Alive     Review of Systems A complete 10 system ROS was obtained and was negative apart from what is mentioned.   Objective:   VITALS:   Filed Vitals:   05/13/14 1104  BP: 138/80  Pulse: 88  Height: 4\' 9"  (1.448 m)  Weight: 145 lb (65.772 kg)   Wt Readings from Last 3 Encounters:  05/13/14 145 lb (65.772 kg)  03/04/14 140 lb 5 oz (63.645 kg)  02/23/14 144 lb (65.318 kg)    GEN:  The patient appears stated age (actually younger) and is in NAD. HEENT:  Normocephalic, atraumatic.  The mucous membranes are moist. The superficial temporal arteries are without ropiness or tenderness. CV:  RRR Lungs:  CTAB Neck/HEME:  There are no carotid bruits bilaterally. Exts:  No edema noted  Neurological examination:  Orientation: The patient is alert and oriented x3. Fund of  knowledge is appropriate.  Recent and remote memory are intact.  Attention and concentration are normal.    Able to name objects and repeat phrases. Cranial nerves: There is good facial symmetry.   Extraocular muscles are intact.  There are no square wave jerks. The speech is fluent and clear. Soft palate rises symmetrically and there is no tongue deviation. Hearing is intact to conversational tone. Sensation: Sensation is intact to light to light touch throughout. Motor: Strength is 5/5 in the bilateral upper and lower extremities.   Shoulder shrug is equal and symmetric.  There is no pronator drift.   Movement examination: Tone:  There is slight increased tone in the LUE Abnormal movements: There was no tremor today even with distraction procedures. Coordination:  There is just slight decreased finger taps on the L Gait and Station:    Her gait had a fairly normal stride length today.  Arm swing was good.  She is stable today.  LABS:  Lab Results  Component Value Date   WBC 6.5 02/01/2014   HGB 14.4 02/01/2014   HCT 42.2 02/01/2014   MCV 86.7 02/01/2014   PLT 343 02/01/2014     Chemistry      Component Value Date/Time   NA 137 12/31/2013 0749   K 3.6 12/31/2013 0749   CL 98 12/31/2013 0749   CO2 26 12/31/2013 0749   BUN 18 12/31/2013 0749   CREATININE 0.8 12/31/2013 0749      Component Value Date/Time   CALCIUM 9.8 12/31/2013 0749   ALKPHOS 75 12/31/2013 0749   AST 40* 12/31/2013 0749   ALT 41* 12/31/2013 0749   BILITOT 0.6 12/31/2013 0749     Lab Results  Component Value Date   VITAMINB12 443 11/26/2011   Lab Results  Component Value Date   FOLATE 13.5 11/26/2011   Lab Results  Component Value Date   TSH 0.895 02/01/2014        Assessment/Plan:   1.  Akinetic rigid Parkinson's disease.  -continue requip 2mg : 4mg /2mg /2mg  for a total of 8 mg per day.  I did not see any swelling today, although this can be a SE from requip.  However, when it is a SE, it is  usually pretty dramatic and I saw none today  -start carbidopa/levodopa 25/100, one tablet tid.  Risks, benefits, side effects and alternative therapies were discussed.  The opportunity to ask questions was given and they were answered to the best of my ability.  The patient expressed understanding and willingness to follow the outlined treatment protocols. 2.  Fatigue  -She is doing better after switching multiple medications including Mirapex to Requip, Cymbalta to Lexapro and discontinuing blood pressure medications.  She is also in pulmonary rehabilitation.  We talked about the importance of cardiovascular exercise outside of rehabilitation. 3.  Depression  -on Lexapro 4.  Weight Gain  -? Etiology.  Started prior to addition of lexapro. 5.  Has appt in June already scheduled and will plan to see her then.

## 2014-05-13 NOTE — Patient Instructions (Signed)
1. Start Carbidopa Levodopa as follows: 1/2 tab three times a day before meals x 1 wk, then 1/2 in am & noon & 1 in evening for a week, then 1/2 in am &1 at noon &one in evening for a week, then 1 tablet three times a day before meals. 2. Stay on Requip.

## 2014-05-17 ENCOUNTER — Encounter (HOSPITAL_COMMUNITY)
Admission: RE | Admit: 2014-05-17 | Discharge: 2014-05-17 | Disposition: A | Discharge status: Home / Self Care | Payer: Medicare Other | Source: Ambulatory Visit | Attending: Pulmonary Disease | Admitting: Pulmonary Disease

## 2014-05-17 DIAGNOSIS — J439 Emphysema, unspecified: Secondary | ICD-10-CM | POA: Diagnosis not present

## 2014-05-17 NOTE — Progress Notes (Signed)
Destiny Roth completed a Six-Minute Walk Test on 05/17/14 . Destiny Roth walked 1471 feet with 0 breaks.  The patient's lowest oxygen saturation was 92 %, highest heart rate was 118 bpm , and highest blood pressure was 148/84. The patient was on room air. Patient stated that nothing hindered their walk test.

## 2014-05-19 ENCOUNTER — Encounter (HOSPITAL_COMMUNITY): Payer: Medicare Other

## 2014-05-24 ENCOUNTER — Encounter (HOSPITAL_COMMUNITY): Payer: Medicare Other

## 2014-05-24 NOTE — Addendum Note (Signed)
Encounter addended by: Benedetto Goad, RN on: 05/24/2014  8:29 AM<BR>     Documentation filed: Notes Section

## 2014-05-24 NOTE — Progress Notes (Signed)
Pulmonary Rehabilitation Discharge Note: Destiny Roth had been discharged from pulmonary rehab after successfully completing 24 exercise and education sessions. Destiny Roth increased her stamina and strength while in the program as evidenced by her ability to walk an additional 138 feet on her discharge 6 min walk test as compared to her admission walk test. Destiny Roth plans to continue her exercise regimen at home by walking outdoors and on her treadmill. If she feels she becomes undisciplined with her home exercise she will call back to join the pulmonary maintenance program. This was one of her personal goals. She did not meet her second personal goal of loosing weight and decreasing edema in her hands and feet. After further conversation with her physician, this may be due to extra salt intake or related to a medication. Destiny Roth will continue to work with her physicians on this matter. It was a pleasure to have Destiny Roth in the program.

## 2014-06-21 ENCOUNTER — Telehealth: Payer: Self-pay | Admitting: Internal Medicine

## 2014-06-21 NOTE — Telephone Encounter (Signed)
Unfortuantely no, since traditional medicare does not pay for lab work prior to visit

## 2014-06-21 NOTE — Telephone Encounter (Signed)
Patient has an appointment on 6/9 and is wondering if she can get orders so she can have her lab work done ahead of time.

## 2014-06-22 ENCOUNTER — Encounter: Payer: Self-pay | Admitting: Internal Medicine

## 2014-06-22 NOTE — Telephone Encounter (Signed)
Left msg on v/m to call back.

## 2014-07-04 ENCOUNTER — Encounter: Payer: Self-pay | Admitting: Neurology

## 2014-07-04 ENCOUNTER — Ambulatory Visit (INDEPENDENT_AMBULATORY_CARE_PROVIDER_SITE_OTHER): Payer: Medicare Other | Admitting: Neurology

## 2014-07-04 VITALS — BP 122/80 | HR 97 | Ht <= 58 in | Wt 145.0 lb

## 2014-07-04 DIAGNOSIS — R5382 Chronic fatigue, unspecified: Secondary | ICD-10-CM | POA: Diagnosis not present

## 2014-07-04 DIAGNOSIS — G20 Parkinson's disease: Secondary | ICD-10-CM

## 2014-07-04 DIAGNOSIS — F330 Major depressive disorder, recurrent, mild: Secondary | ICD-10-CM | POA: Diagnosis not present

## 2014-07-04 NOTE — Progress Notes (Signed)
Subjective:   Destiny Roth was seen in f/u for PD diagnosed 10/2011.  The patient is a 64 y.o. right handed female with a history of tremor.Onset of symptoms was at end of 2012.   She has noted a gradual loss of smell and taste over the last 20 years.  She even had a rhinoplasty and septoplasty in 1993 to see if that would help and it did not.  She can smell almost nothing now.    She is on Mirapex ER 1.5 mg daily.  The patient states that the tremor is virtually gone, with the exception of when she is stressed at work and then she will notice tremor in both legs.  Her walking is much better.  She feels more stable.  .  She does have some dizziness in the early morning and is unsure if that is related to Mirapex, her blood pressure medication or potassium.  No compulsive behaviors have been noted with the Mirapex.  There have been no sleep attacks.  She has not yet started exercising.  She is going to be changing jobs which is stressful for her.  10/12/12 update:  She has had some tremor internally and in the legs.  She notices some word finding trouble.   She has been under increased stress.  Her husband had back surgery 2 weeks ago and she has been the primary caregiver.  In addition, she is between jobs and needs to find a new job.  She would like to see if she can get off her blood pressure medication.  02/08/13 update:  Last visit, I increased the patient's Mirapex to a total of 2.25 mg daily.  The patient states that it definitely helped the tremor.  She almost never sees tremor any more.  She is, however, complaining about weight gain.  Her weight has been stable for many months, but when we looked back she gained weight between her May, 2014 visit and her September, 2014 visit.  She is frustrated that she cannot get that off.  In addition, she complains of some low energy and vaginal dryness.  I did look at her prior medical records since last visit.  She was complaining to her primary care  physician about dizziness and her blood pressure medication was decreased.  She did see Dr. Elsworth Soho since last visit and a split-night sleep study was recommended.  She states that she is scheduled to have foot surgery for a bunion on February 17 2013.  She had a procedure this morning to "break up a calcification" in the right shoulder.  06/07/13 update:  Pt is f/u today.  The records that were made available to me were reviewed since last visit.  Pt had surgery on both feet on separate dates for bunyionectomies.  Had tachycardia after the first and ended up having a holter monitor but HR was normal on the monitor.  Diltiazem was decreased, pindolol was added.  She had a PSG done since last visit and had an AHI of 12.   No CPAP has been recommended at this point.  She also had a R shoulder surgery on April 7 but those records are not available to me.  She does c/o if bending over, she will have lightheadedness but separate from that she has had 2 episodes of vertigo (room spinning).  Only tremor in the R hand if she has to hold out the arm that she has surgery on and then she experiences pain, so she is  not sure if the tremor is related to the pain or the surgery.  She is planning to go to [place] in about 5 weeks and is worried that she will not feel better by then.  Overall, since the arm surgery and feet surgery, she just does not feel back to herself.  She has not been able to exercise.  10/08/13 update:  The patient is following up today regarding her Parkinson's disease.  Last visit, I increased her Mirapex to 3.0 mg daily.  She is also on Cymbalta.  She has been having more fatigue and shortness of breath.  She just had a nuclear stress test done that was normal.  Her left ventricular ejection fraction was 77%.  She denies falls.  No syncope.  She is not sleepy, just fatigued during the day.  Saw Dr. Harrington Challenger today and increasing diltiazem and metoprolol is being decreased.  02/09/14 update:  The records that were  made available to me were reviewed since last visit.  Pt is accompanied by her husband who supplements the history today (never been present before).  Pt is supposed to be on mirapex 1.0 mg tid but has backed down to bid dosing.  I had to change this from the 3.0 mg ER version last visit because of insurance.  She has been to several other providers since last visit and I have discussed cases with several providers.  She has had an extensive cardiopulmonary workup for fatigue and shortness of breath.  She had an echocardiogram, stress Myoview and cardiac catheterization that were all unremarkable.  She does have mild to moderate obstructive sleep apnea syndrome, but refuses CPAP.  She saw Dr. Coralyn Mark for the sensation of being hot.  She has had an extensive workup for this as well.  No etiology has been determined.  She did go off of the Cymbalta to see if that would be of benefit.  She has been off of it since end of November and that hasn't changed.  No sweating at all.  Just feels like she has a "hot flash" without sweating.  Not sure that depression got worse going off of it but not sure if deals with things as well but also thinks that had SE with the cymbalta.  Pt does think that the mirapex may be causing some of the above issues.  She has been having LE swelling and hand edema as well.  She states that she has a "brain fog" that she thinks is due to the med.  She has a diffuse weakness and an inability to exercise due to SOB.  Again, she wonders if it is all due to mirapex.    03/04/14 update:  I held the patients mirapex for a few days and the patient felt that hot flashes and edema were better but not surprisingly, the PD symptoms got much worse.   She was glad that she did get the opportunity to see how she was off of medication but admits that it scared her to see how bad things got.   Decided to start requip and she is on 2 mg tid.  She still has some trouble with fine motor movements.  The records  that were made available to me were reviewed.  She saw Dr. Jenny Reichmann and since she still needed medication for depression after she d/c the cymbalta, she was put on lexapro.  She has only been on that since 02/15/14.  That is helping.  She is in pulmonary rehab and  that is helping greatly.  She has also d/c her antihypertensives except her HCTZ as she still has some peripheral edema.    05/13/14 update:  The patient is following up today a little bit sooner than expected.  Last visit, she was on Requip, 2 mg 3 times a day and she did not want to increase the dosage, even though she was slightly underdosed.  She did call me on April 7 complaining of increased rigidity and we decided to increase her dosage to 4 mg in the morning, 2 mg in the afternoon and 2 mg in the evening.  She called me just a few days ago to tell me that she still did not feel well and that is why she is following up.  Pt states that the increase may have helped somewhat but not fully.  She states that "I want to know that I am on the best medication for me."  Every few days, "I feel like I am going into a crisis."  She will have difficulty with washing her hair, tremor, manipulating her hands.  She has other issues that she is unsure are unrelated to PD.  She has "IBS" with diarrhea up to 15 times a day.  She has pain on the bottom of the feet.  She had a formal easter dinner at her house and spent all of the previous Friday (13 hours) preparing as she knew it would take that long.  She c/o swelling and weight gain  07/04/14 update:  The patient is following up today.  She is on ropinirole 2 mg and takes 2 tablets in the morning, one in the afternoon and 1 in evening.  Last visit, I started her on carbidopa/levodopa 25/100, one tablet 3 times per day.  Today, the patient states that she is markedly better.  She no longer has the internal shaking and overall just feels better.  She is still frustrated about her weight and having trouble losing it.     She denies any lightheadedness, syncope, hallucinations, visual distortions, falls.  She still has some foot pain but is back to wearing her heels.  She does state that she tires easily.  Pulm rehab is complete and she states that it made a huge difference.  She feels that her breathing is much better.    Current/Previously tried tremor medications: Klonopin  Current medications that may exacerbate tremor:  ?Cymbalta.  I reviewed the MRI of the brain done at the end of 2013 with her.  This revealed some mild small vessel disease, likely secondary to hypertension.  Otherwise, it was unremarkable.  The small vessel disease was not within the basal ganglia.  I showed her the films in the office today.   No Known Allergies  Current Outpatient Prescriptions on File Prior to Visit  Medication Sig Dispense Refill  . aspirin 81 MG tablet Take 81 mg by mouth daily.    Marland Kitchen atorvastatin (LIPITOR) 20 MG tablet Take 1 tablet (20 mg total) by mouth daily. (Patient taking differently: Take 10 mg by mouth daily. ) 90 tablet 3  . carbidopa-levodopa (SINEMET IR) 25-100 MG per tablet Take 1 tablet by mouth 3 (three) times daily. 270 tablet 1  . escitalopram (LEXAPRO) 10 MG tablet Take 1 tablet (10 mg total) by mouth daily. 90 tablet 3  . hydrochlorothiazide (HYDRODIURIL) 25 MG tablet Take 1 tablet (25 mg total) by mouth daily. 90 tablet 3  . metoprolol tartrate (LOPRESSOR) 25 MG tablet Take 1 tablet (  25 mg total) by mouth 2 (two) times daily. (Patient taking differently: Take 25 mg by mouth at bedtime. ) 180 tablet 3  . Multiple Vitamins-Calcium (ONE-A-DAY WOMENS PO) Take 1 tablet by mouth daily.    . potassium chloride (KLOR-CON 10) 10 MEQ tablet Take 1 tablet (10 mEq total) by mouth daily. 90 tablet 3  . rOPINIRole (REQUIP) 2 MG tablet 2 tablets in the morning, 1 in the afternoon, 1 in the evening 360 tablet 1  . albuterol (PROVENTIL HFA;VENTOLIN HFA) 108 (90 BASE) MCG/ACT inhaler Inhale 2 puffs into the lungs  every 6 (six) hours as needed for wheezing or shortness of breath. (Patient not taking: Reported on 07/04/2014) 1 Inhaler 2   No current facility-administered medications on file prior to visit.    Past Medical History  Diagnosis Date  . GLUCOSE INTOLERANCE 05/24/2008  . HYPERLIPIDEMIA 09/23/2006  . ANXIETY 09/23/2006  . DEPRESSION 09/23/2006  . CARPAL TUNNEL SYNDROME, BILATERAL 09/23/2006  . HYPERTENSION 09/23/2006  . GERD 05/24/2008  . DIVERTICULOSIS, COLON 09/23/2006  . IBS 09/23/2006  . BUNION, RIGHT FOOT 09/18/2009  . OSTEOPOROSIS 05/24/2008  . Abdominal pain, epigastric 09/18/2009  . Impaired glucose tolerance 11/04/2010  . Anosmia 11/06/2010  . Parkinson's disease   . Restrictive lung disease 12/20/2013    Past Surgical History  Procedure Laterality Date  . Tubal ligation    . Nasal septum repair    . S/p ganglion cyst right wrist    . S/p bilat cts    . S/p right thumb trigger finger surgury    . S/p fibroid ablation    . Esophagogastroduodenoscopy  2006/04/30    Dr. Harriette Bouillon duodenal ulcer  . Colonoscopy w/ biopsies  04-30-06    Dr. Collene Mares -negative random bxs  . Cataract extraction    . Shoulder surgery Right 04-29-13  . Foot surgery Bilateral April 29, 2013  . Cardiac catheterization    . Left and right heart catheterization with coronary angiogram N/A 11/09/2013    Procedure: LEFT AND RIGHT HEART CATHETERIZATION WITH CORONARY ANGIOGRAM;  Surgeon: Peter M Martinique, MD;  Location: [facility] CATH LAB;  Service: Cardiovascular;  Laterality: N/A;    History   Social History  . Marital Status: Married    Spouse Name: N/A  . Number of Children: 2  . Years of Education: N/A   Occupational History  . Not on file.   Social History Main Topics  . Smoking status: Never Smoker   . Smokeless tobacco: Never Used  . Alcohol Use: Yes     Comment: Occ  . Drug Use: No  . Sexual Activity:    Partners: Male    Birth Control/ Protection: Post-menopausal   Other Topics Concern  . Not on file   Social  History Narrative    Family Status  Relation Status Death Age  . Mother Deceased     April 29, 2001, PVD, [place], [place]  . Father Deceased 31    [place], hypertension since teens  . Child Alive     2, alive and well  . Maternal Grandmother Deceased     PD  . Brother Alive   . Brother Alive   . Brother Alive     Review of Systems A complete 10 system ROS was obtained and was negative apart from what is mentioned.   Objective:   VITALS:   Filed Vitals:   07/04/14 1051  BP: 122/80  Pulse: 97  Height: 4\' 10"  (1.473 m)  Weight: 145 lb (65.772 kg)  Wt Readings from Last 3 Encounters:  07/04/14 145 lb (65.772 kg)  05/13/14 145 lb (65.772 kg)  03/04/14 140 lb 5 oz (63.645 kg)    GEN:  The patient appears stated age (actually younger) and is in NAD. HEENT:  Normocephalic, atraumatic.  The mucous membranes are moist. The superficial temporal arteries are without ropiness or tenderness. CV:  RRR Lungs:  CTAB Neck/HEME:  There are no carotid bruits bilaterally. Exts:  No edema noted  Neurological examination:  Orientation: The patient is alert and oriented x3. Fund of knowledge is appropriate.  Recent and remote memory are intact.  Attention and concentration are normal.    Able to name objects and repeat phrases. Cranial nerves: There is good facial symmetry.   Extraocular muscles are intact.  There are no square wave jerks. The speech is fluent and clear. Soft palate rises symmetrically and there is no tongue deviation. Hearing is intact to conversational tone. Sensation: Sensation is intact to light to light touch throughout. Motor: Strength is 5/5 in the bilateral upper and lower extremities.   Shoulder shrug is equal and symmetric.  There is no pronator drift.   Movement examination: Tone: There is slight increased tone in the LUE Abnormal movements: There was no tremor today even with distraction procedures. Coordination:  There is just slight decreased finger taps on the L Gait  and Station:    Her gait had a fairly normal stride length today.  Arm swing was good.  She is stable today.  LABS:  Lab Results  Component Value Date   WBC 6.5 02/01/2014   HGB 14.4 02/01/2014   HCT 42.2 02/01/2014   MCV 86.7 02/01/2014   PLT 343 02/01/2014     Chemistry      Component Value Date/Time   NA 137 12/31/2013 0749   K 3.6 12/31/2013 0749   CL 98 12/31/2013 0749   CO2 26 12/31/2013 0749   BUN 18 12/31/2013 0749   CREATININE 0.8 12/31/2013 0749      Component Value Date/Time   CALCIUM 9.8 12/31/2013 0749   ALKPHOS 75 12/31/2013 0749   AST 40* 12/31/2013 0749   ALT 41* 12/31/2013 0749   BILITOT 0.6 12/31/2013 0749     Lab Results  Component Value Date   VITAMINB12 443 11/26/2011   Lab Results  Component Value Date   FOLATE 13.5 11/26/2011   Lab Results  Component Value Date   TSH 0.895 02/01/2014        Assessment/Plan:   1.  Akinetic rigid Parkinson's disease.  -continue requip 2mg : 4mg /2mg /2mg  for a total of 8 mg per day.  I did not see any swelling today, although this can be a SE from requip.  However, when it is a SE, it is usually pretty dramatic and I saw none today  -continue carbidopa/levodopa 25/100, one tablet tid (started levodopa 04/2014).  Feels better on this.  Risks, benefits, side effects and alternative therapies were discussed.  The opportunity to ask questions was given and they were answered to the best of my ability.  The patient expressed understanding and willingness to follow the outlined treatment protocols.  -She asks me about lewy body dementia and I reassured her that she does not have this..    -She was given information on the PD boxing program.  We talked about the cycling program as well.  She states that she lives far away.   2.  Fatigue  -Still an issue.  Meds have been changed  significantly and haven't resulted in a big change.  She even tried to d/c the metoprolol but had high heart rate during pulm rehab.    -We  discussed selegeline and she would like to try it to see if it would help.  However, talked to her about risk of serotonin syndrome in combination with lexapro. Decided to hold on that until she sees PCP on Friday to discuss the lexapro.   3.  Depression  -on Lexapro.  She wants to consider changing to something else, as she perceives that this is contributing to weight gain.  She was previously on Cymbalta though and complained about the same issue.  She has an appointment with her primary care physician on Friday to discuss this and I did not add selegiline because of this.  We can discuss it in the future. 4. Much greater than 50% of this visit was spent in counseling with the patient and the family.  Total face to face time:  35 min

## 2014-07-07 ENCOUNTER — Other Ambulatory Visit (INDEPENDENT_AMBULATORY_CARE_PROVIDER_SITE_OTHER): Payer: Medicare Other

## 2014-07-07 ENCOUNTER — Ambulatory Visit (INDEPENDENT_AMBULATORY_CARE_PROVIDER_SITE_OTHER): Payer: Medicare Other | Admitting: Internal Medicine

## 2014-07-07 ENCOUNTER — Encounter: Payer: Self-pay | Admitting: Internal Medicine

## 2014-07-07 VITALS — BP 122/84 | HR 87 | Temp 98.3°F | Wt 146.0 lb

## 2014-07-07 DIAGNOSIS — I10 Essential (primary) hypertension: Secondary | ICD-10-CM | POA: Diagnosis not present

## 2014-07-07 DIAGNOSIS — R7302 Impaired glucose tolerance (oral): Secondary | ICD-10-CM

## 2014-07-07 DIAGNOSIS — E785 Hyperlipidemia, unspecified: Secondary | ICD-10-CM

## 2014-07-07 DIAGNOSIS — F329 Major depressive disorder, single episode, unspecified: Secondary | ICD-10-CM

## 2014-07-07 DIAGNOSIS — R609 Edema, unspecified: Secondary | ICD-10-CM | POA: Diagnosis not present

## 2014-07-07 DIAGNOSIS — F32A Depression, unspecified: Secondary | ICD-10-CM

## 2014-07-07 LAB — CBC WITH DIFFERENTIAL/PLATELET
BASOS ABS: 0 10*3/uL (ref 0.0–0.1)
Basophils Relative: 0.2 % (ref 0.0–3.0)
EOS ABS: 0.1 10*3/uL (ref 0.0–0.7)
EOS PCT: 1.8 % (ref 0.0–5.0)
HEMATOCRIT: 44.4 % (ref 36.0–46.0)
Hemoglobin: 15.1 g/dL — ABNORMAL HIGH (ref 12.0–15.0)
LYMPHS ABS: 1.6 10*3/uL (ref 0.7–4.0)
Lymphocytes Relative: 20.9 % (ref 12.0–46.0)
MCHC: 34 g/dL (ref 30.0–36.0)
MCV: 87.9 fl (ref 78.0–100.0)
Monocytes Absolute: 0.5 10*3/uL (ref 0.1–1.0)
Monocytes Relative: 6 % (ref 3.0–12.0)
NEUTROS PCT: 71.1 % (ref 43.0–77.0)
Neutro Abs: 5.5 10*3/uL (ref 1.4–7.7)
Platelets: 337 10*3/uL (ref 150.0–400.0)
RBC: 5.05 Mil/uL (ref 3.87–5.11)
RDW: 13 % (ref 11.5–15.5)
WBC: 7.7 10*3/uL (ref 4.0–10.5)

## 2014-07-07 LAB — URINALYSIS, ROUTINE W REFLEX MICROSCOPIC
BILIRUBIN URINE: NEGATIVE
HGB URINE DIPSTICK: NEGATIVE
Ketones, ur: NEGATIVE
Leukocytes, UA: NEGATIVE
Nitrite: NEGATIVE
RBC / HPF: NONE SEEN (ref 0–?)
Specific Gravity, Urine: 1.025 (ref 1.000–1.030)
Total Protein, Urine: NEGATIVE
Urine Glucose: NEGATIVE
Urobilinogen, UA: 0.2 (ref 0.0–1.0)
pH: 5.5 (ref 5.0–8.0)

## 2014-07-07 LAB — BASIC METABOLIC PANEL
BUN: 21 mg/dL (ref 6–23)
CALCIUM: 10.1 mg/dL (ref 8.4–10.5)
CO2: 31 meq/L (ref 19–32)
Chloride: 99 mEq/L (ref 96–112)
Creatinine, Ser: 0.91 mg/dL (ref 0.40–1.20)
GFR: 65.78 mL/min (ref >60.00)
Glucose, Bld: 96 mg/dL (ref 70–99)
Potassium: 3.7 mEq/L (ref 3.5–5.1)
SODIUM: 137 meq/L (ref 135–145)

## 2014-07-07 LAB — LIPID PANEL
CHOL/HDL RATIO: 4
CHOLESTEROL: 212 mg/dL — AB (ref 0–200)
HDL: 47.4 mg/dL (ref >39.00)
Triglycerides: 411 mg/dL — ABNORMAL HIGH (ref 0.0–149.0)

## 2014-07-07 LAB — HEPATIC FUNCTION PANEL
ALK PHOS: 76 U/L (ref 39–117)
ALT: 8 U/L (ref 0–35)
AST: 28 U/L (ref 0–37)
Albumin: 4.3 g/dL (ref 3.5–5.2)
BILIRUBIN TOTAL: 0.5 mg/dL (ref 0.2–1.2)
Bilirubin, Direct: 0 mg/dL (ref 0.0–0.3)
Total Protein: 7.4 g/dL (ref 6.0–8.3)

## 2014-07-07 LAB — TSH: TSH: 1.14 u[IU]/mL (ref 0.35–4.50)

## 2014-07-07 LAB — LDL CHOLESTEROL, DIRECT: Direct LDL: 104 mg/dL

## 2014-07-07 LAB — HEMOGLOBIN A1C: Hgb A1c MFr Bld: 5.3 % (ref 4.6–6.5)

## 2014-07-07 MED ORDER — METOPROLOL TARTRATE 25 MG PO TABS: 12.5000 mg | ORAL_TABLET | Freq: Two times a day (BID) | ORAL | Status: DC | PRN

## 2014-07-07 MED ORDER — FUROSEMIDE 20 MG PO TABS: 20.0000 mg | ORAL_TABLET | Freq: Every day | ORAL | Status: DC

## 2014-07-07 NOTE — Progress Notes (Signed)
Pre visit review using our clinic review tool, if applicable. No additional management support is needed unless otherwise documented below in the visit note. 

## 2014-07-07 NOTE — Progress Notes (Signed)
Subjective:    Patient ID: Destiny Roth, female    DOB: [birth year], 64 y.o.   MRN: [id number]  HPI  Here for yearly f/u;  Overall doing ok;  Pt denies Chest pain, worsening SOB, DOE, wheezing, orthopnea, PND, worsening LE edema, palpitations, dizziness or syncope, except for mild worsening edema in the last month to hands and feet..  Pt denies neurological change such as new headache, facial or extremity weakness.  Pt denies polydipsia, polyuria, or low sugar symptoms. Pt states overall good compliance with treatment and medications, good tolerability, and has been trying to follow appropriate diet.  Pt denies worsening depressive symptoms, suicidal ideation or panic. No fever, night sweats, wt loss, loss of appetite, or other constitutional symptoms.  Pt states good ability with ADL's, has low fall risk, home safety reviewed and adequate, no other significant changes in hearing or vision.  Asks for wean off lexapro if approp at this time as has been doing well for over 1 yr.  Wants to try possibly off the beta blocker due to fatigue, denies hypersomnolence Past Medical History  Diagnosis Date  . GLUCOSE INTOLERANCE 05/24/2008  . HYPERLIPIDEMIA 09/23/2006  . ANXIETY 09/23/2006  . DEPRESSION 09/23/2006  . CARPAL TUNNEL SYNDROME, BILATERAL 09/23/2006  . HYPERTENSION 09/23/2006  . GERD 05/24/2008  . DIVERTICULOSIS, COLON 09/23/2006  . IBS 09/23/2006  . BUNION, RIGHT FOOT 09/18/2009  . OSTEOPOROSIS 05/24/2008  . Abdominal pain, epigastric 09/18/2009  . Impaired glucose tolerance 11/04/2010  . Anosmia 11/06/2010  . Parkinson's disease   . Restrictive lung disease 12/20/2013   Past Surgical History  Procedure Laterality Date  . Tubal ligation    . Nasal septum repair    . S/p ganglion cyst right wrist    . S/p bilat cts    . S/p right thumb trigger finger surgury    . S/p fibroid ablation    . Esophagogastroduodenoscopy  2008    Dr. Harriette Bouillon duodenal ulcer  . Colonoscopy w/ biopsies  2008    Dr. Collene Mares -negative random bxs  . Cataract extraction    . Shoulder surgery Right 2015  . Foot surgery Bilateral 2015  . Cardiac catheterization    . Left and right heart catheterization with coronary angiogram N/A 11/09/2013    Procedure: LEFT AND RIGHT HEART CATHETERIZATION WITH CORONARY ANGIOGRAM;  Surgeon: Peter M Martinique, MD;  Location: [facility] CATH LAB;  Service: Cardiovascular;  Laterality: N/A;    reports that she has never smoked. She has never used smokeless tobacco. She reports that she drinks alcohol. She reports that she does not use illicit drugs. family history includes Heart attack in her brother and mother; Heart disease (age of onset: 31) in her father; Hypertension in her brother, brother, brother, and father; Peripheral vascular disease in her mother. No Known Allergies Current Outpatient Prescriptions on File Prior to Visit  Medication Sig Dispense Refill  . aspirin 81 MG tablet Take 81 mg by mouth daily.    Marland Kitchen atorvastatin (LIPITOR) 20 MG tablet Take 1 tablet (20 mg total) by mouth daily. (Patient taking differently: Take 10 mg by mouth daily. ) 90 tablet 3  . carbidopa-levodopa (SINEMET IR) 25-100 MG per tablet Take 1 tablet by mouth 3 (three) times daily. 270 tablet 1  . Multiple Vitamins-Calcium (ONE-A-DAY WOMENS PO) Take 1 tablet by mouth daily.    . potassium chloride (KLOR-CON 10) 10 MEQ tablet Take 1 tablet (10 mEq total) by mouth daily. 90 tablet 3  . rOPINIRole (REQUIP)  2 MG tablet 2 tablets in the morning, 1 in the afternoon, 1 in the evening 360 tablet 1  . albuterol (PROVENTIL HFA;VENTOLIN HFA) 108 (90 BASE) MCG/ACT inhaler Inhale 2 puffs into the lungs every 6 (six) hours as needed for wheezing or shortness of breath. (Patient not taking: Reported on 07/04/2014) 1 Inhaler 2   No current facility-administered medications on file prior to visit.   Review of Systems  Constitutional: Negative for increased diaphoresis, other activity, appetite or siginficant weight  change other than noted HENT: Negative for worsening hearing loss, ear pain, facial swelling, mouth sores and neck stiffness.   Eyes: Negative for other worsening pain, redness or visual disturbance.  Respiratory: Negative for shortness of breath and wheezing  Cardiovascular: Negative for chest pain and palpitations.  Gastrointestinal: Negative for diarrhea, blood in stool, abdominal distention or other pain Genitourinary: Negative for hematuria, flank pain or change in urine volume.  Musculoskeletal: Negative for myalgias or other joint complaints.  Skin: Negative for color change and wound or drainage.  Neurological: Negative for syncope and numbness. other than noted Hematological: Negative for adenopathy. or other swelling Psychiatric/Behavioral: Negative for hallucinations, SI, self-injury, decreased concentration or other worsening agitation.       Objective:   Physical Exam BP 122/84 mmHg  Pulse 87  Temp(Src) 98.3 F (36.8 C) (Oral)  Wt 146 lb (66.225 kg)  SpO2 95% VS noted,  Constitutional: Pt is oriented to person, place, and time. Appears well-developed and well-nourished, in no significant distress Head: Normocephalic and atraumatic.  Right Ear: External ear normal.  Left Ear: External ear normal.  Nose: Nose normal.  Mouth/Throat: Oropharynx is clear and moist.  Eyes: Conjunctivae and EOM are normal. Pupils are equal, round, and reactive to light.  Neck: Normal range of motion. Neck supple. No JVD present. No tracheal deviation present or significant neck LA or mass Cardiovascular: Normal rate, regular rhythm, normal heart sounds and intact distal pulses.   Pulmonary/Chest: Effort normal and breath sounds without rales or wheezing  Abdominal: Soft. Bowel sounds are normal. NT. No HSM  Musculoskeletal: Normal range of motion. Exhibits trace edema to extremities.  Lymphadenopathy:  Has no cervical adenopathy.  Neurological: Pt is alert and oriented to person, place, and  time. Pt has normal reflexes. No cranial nerve deficit. O/w not done in detail Skin: Skin is warm and dry. No rash noted.  Psychiatric:  Has normal mood and affect. Behavior is normal.        Assessment & Plan:

## 2014-07-07 NOTE — Patient Instructions (Addendum)
OK to wean off the lexapro by taking HALF 10 mg pill per day for 4 wks, then stop completely.  You could then call Dr Tat in 4 wks to consider starting selegilene  OK to stop the HCT fluid pill  Please take all new medication as prescribed - the lasix at 20 mg per day  OK to change the metoprolol to HALF of the 25 mg twice per day as needed only  Please continue to monitor your Blood Pressure and Heart rate, with the goals being < 140/90, and less then 100  Please go to the LAB in the Basement (turn left off the elevator) for the tests to be done today  You will be contacted by phone if any changes need to be made immediately.  Otherwise, you will receive a letter about your results with an explanation, but please check with MyChart first.  Please remember to sign up for MyChart if you have not done so, as this will be important to you in the future with finding out test results, communicating by private email, and scheduling acute appointments online when needed.  Please return in 6 months, or sooner if needed

## 2014-07-09 DIAGNOSIS — R609 Edema, unspecified: Secondary | ICD-10-CM | POA: Insufficient documentation

## 2014-07-09 NOTE — Assessment & Plan Note (Signed)
stable overall by history and exam, recent data reviewed with pt, and pt to continue medical treatment as before,  to f/u any worsening symptoms or concerns Lab Results  Component Value Date   LDLCALC 76 12/29/2012   For f/u lab

## 2014-07-09 NOTE — Assessment & Plan Note (Signed)
Asympt, for a1c today, cont wt control/diet efforts

## 2014-07-09 NOTE — Assessment & Plan Note (Addendum)
Ok for change hct to lasix 20 qd ,  to f/u any worsening symptoms or concerns  Note:  Total time for pt hx, exam, review of record with pt in the room, determination of diagnoses and plan for further eval and tx is > 40 min, with over 50% spent in coordination and counseling of patient

## 2014-07-09 NOTE — Assessment & Plan Note (Signed)
Ok for wean off lexapro by taking HALF for 4 wks, then stop

## 2014-07-09 NOTE — Assessment & Plan Note (Signed)
Ok to change metoprolol to 25 mg 1/2 bid prn,  to f/u any worsening symptoms or concerns, to f/u BP at home and next visit BP Readings from Last 3 Encounters:  07/07/14 122/84  07/04/14 122/80  05/13/14 138/80

## 2014-07-22 ENCOUNTER — Telehealth: Payer: Self-pay | Admitting: *Deleted

## 2014-07-22 NOTE — Telephone Encounter (Signed)
Pt will pick up antibiotic

## 2014-11-03 ENCOUNTER — Ambulatory Visit (INDEPENDENT_AMBULATORY_CARE_PROVIDER_SITE_OTHER): Payer: Medicare Other | Admitting: Neurology

## 2014-11-03 ENCOUNTER — Encounter: Payer: Self-pay | Admitting: Neurology

## 2014-11-03 VITALS — BP 134/88 | HR 83 | Ht <= 58 in | Wt 144.0 lb

## 2014-11-03 DIAGNOSIS — F411 Generalized anxiety disorder: Secondary | ICD-10-CM

## 2014-11-03 DIAGNOSIS — K5901 Slow transit constipation: Secondary | ICD-10-CM

## 2014-11-03 DIAGNOSIS — G20 Parkinson's disease: Secondary | ICD-10-CM

## 2014-11-03 NOTE — Progress Notes (Signed)
Subjective:   Destiny Roth was seen in f/u for PD diagnosed 10/2011.  The patient is a 64 y.o. right handed female with a history of tremor.Onset of symptoms was at end of 2012.   She has noted a gradual loss of smell and taste over the last 20 years.  She even had a rhinoplasty and septoplasty in 1993 to see if that would help and it did not.  She can smell almost nothing now.    She is on Mirapex ER 1.5 mg daily.  The patient states that the tremor is virtually gone, with the exception of when she is stressed at work and then she will notice tremor in both legs.  Her walking is much better.  She feels more stable.  .  She does have some dizziness in the early morning and is unsure if that is related to Mirapex, her blood pressure medication or potassium.  No compulsive behaviors have been noted with the Mirapex.  There have been no sleep attacks.  She has not yet started exercising.  She is going to be changing jobs which is stressful for her.  10/12/12 update:  She has had some tremor internally and in the legs.  She notices some word finding trouble.   She has been under increased stress.  Her husband had back surgery 2 weeks ago and she has been the primary caregiver.  In addition, she is between jobs and needs to find a new job.  She would like to see if she can get off her blood pressure medication.  02/08/13 update:  Last visit, I increased the patient's Mirapex to a total of 2.25 mg daily.  The patient states that it definitely helped the tremor.  She almost never sees tremor any more.  She is, however, complaining about weight gain.  Her weight has been stable for many months, but when we looked back she gained weight between her May, 2014 visit and her September, 2014 visit.  She is frustrated that she cannot get that off.  In addition, she complains of some low energy and vaginal dryness.  I did look at her prior medical records since last visit.  She was complaining to her primary care  physician about dizziness and her blood pressure medication was decreased.  She did see Dr. Elsworth Soho since last visit and a split-night sleep study was recommended.  She states that she is scheduled to have foot surgery for a bunion on February 17 2013.  She had a procedure this morning to "break up a calcification" in the right shoulder.  06/07/13 update:  Pt is f/u today.  The records that were made available to me were reviewed since last visit.  Pt had surgery on both feet on separate dates for bunyionectomies.  Had tachycardia after the first and ended up having a holter monitor but HR was normal on the monitor.  Diltiazem was decreased, pindolol was added.  She had a PSG done since last visit and had an AHI of 12.   No CPAP has been recommended at this point.  She also had a R shoulder surgery on April 7 but those records are not available to me.  She does c/o if bending over, she will have lightheadedness but separate from that she has had 2 episodes of vertigo (room spinning).  Only tremor in the R hand if she has to hold out the arm that she has surgery on and then she experiences pain, so she is  not sure if the tremor is related to the pain or the surgery.  She is planning to go to [place] in about 5 weeks and is worried that she will not feel better by then.  Overall, since the arm surgery and feet surgery, she just does not feel back to herself.  She has not been able to exercise.  10/08/13 update:  The patient is following up today regarding her Parkinson's disease.  Last visit, I increased her Mirapex to 3.0 mg daily.  She is also on Cymbalta.  She has been having more fatigue and shortness of breath.  She just had a nuclear stress test done that was normal.  Her left ventricular ejection fraction was 77%.  She denies falls.  No syncope.  She is not sleepy, just fatigued during the day.  Saw Dr. Harrington Challenger today and increasing diltiazem and metoprolol is being decreased.  02/09/14 update:  The records that were  made available to me were reviewed since last visit.  Pt is accompanied by her husband who supplements the history today (never been present before).  Pt is supposed to be on mirapex 1.0 mg tid but has backed down to bid dosing.  I had to change this from the 3.0 mg ER version last visit because of insurance.  She has been to several other providers since last visit and I have discussed cases with several providers.  She has had an extensive cardiopulmonary workup for fatigue and shortness of breath.  She had an echocardiogram, stress Myoview and cardiac catheterization that were all unremarkable.  She does have mild to moderate obstructive sleep apnea syndrome, but refuses CPAP.  She saw Dr. Coralyn Mark for the sensation of being hot.  She has had an extensive workup for this as well.  No etiology has been determined.  She did go off of the Cymbalta to see if that would be of benefit.  She has been off of it since end of November and that hasn't changed.  No sweating at all.  Just feels like she has a "hot flash" without sweating.  Not sure that depression got worse going off of it but not sure if deals with things as well but also thinks that had SE with the cymbalta.  Pt does think that the mirapex may be causing some of the above issues.  She has been having LE swelling and hand edema as well.  She states that she has a "brain fog" that she thinks is due to the med.  She has a diffuse weakness and an inability to exercise due to SOB.  Again, she wonders if it is all due to mirapex.    03/04/14 update:  I held the patients mirapex for a few days and the patient felt that hot flashes and edema were better but not surprisingly, the PD symptoms got much worse.   She was glad that she did get the opportunity to see how she was off of medication but admits that it scared her to see how bad things got.   Decided to start requip and she is on 2 mg tid.  She still has some trouble with fine motor movements.  The records  that were made available to me were reviewed.  She saw Dr. Jenny Reichmann and since she still needed medication for depression after she d/c the cymbalta, she was put on lexapro.  She has only been on that since 02/15/14.  That is helping.  She is in pulmonary rehab and  that is helping greatly.  She has also d/c her antihypertensives except her HCTZ as she still has some peripheral edema.    05/13/14 update:  The patient is following up today a little bit sooner than expected.  Last visit, she was on Requip, 2 mg 3 times a day and she did not want to increase the dosage, even though she was slightly underdosed.  She did call me on April 7 complaining of increased rigidity and we decided to increase her dosage to 4 mg in the morning, 2 mg in the afternoon and 2 mg in the evening.  She called me just a few days ago to tell me that she still did not feel well and that is why she is following up.  Pt states that the increase may have helped somewhat but not fully.  She states that "I want to know that I am on the best medication for me."  Every few days, "I feel like I am going into a crisis."  She will have difficulty with washing her hair, tremor, manipulating her hands.  She has other issues that she is unsure are unrelated to PD.  She has "IBS" with diarrhea up to 15 times a day.  She has pain on the bottom of the feet.  She had a formal easter dinner at her house and spent all of the previous Friday (13 hours) preparing as she knew it would take that long.  She c/o swelling and weight gain  07/04/14 update:  The patient is following up today.  She is on ropinirole 2 mg and takes 2 tablets in the morning, one in the afternoon and 1 in evening.  Last visit, I started her on carbidopa/levodopa 25/100, one tablet 3 times per day.  Today, the patient states that she is markedly better.  She no longer has the internal shaking and overall just feels better.  She is still frustrated about her weight and having trouble losing it.     She denies any lightheadedness, syncope, hallucinations, visual distortions, falls.  She still has some foot pain but is back to wearing her heels.  She does state that she tires easily.  Pulm rehab is complete and she states that it made a huge difference.  She feels that her breathing is much better.    11/03/14 update:  The patient is following up today regarding her Parkinson's disease.  She is on Requip, 4 mg in the morning, 2 mg in the afternoon and 2 mg in the evening.  She is on carbidopa/levodopa 25/100, one tablet 3 times per day.  I reviewed records since our last visit.  Her primary care physician tapered her off of her Lexapro in June, as requested by the patient.  She states that when she was off of it she had anxiety sx's that were somatic and went back on lexapro 10 mg and cut it in 1/2.  She states that "it has been a good summer."  She is going to [place] in December and camping next week.  She has had some alternating constipation/diarrhea but diarrhea related to eating things like fruit.  No falls.  Current/Previously tried tremor medications: Klonopin  Current medications that may exacerbate tremor:  ?Cymbalta.  I reviewed the MRI of the brain done at the end of 2013 with her.  This revealed some mild small vessel disease, likely secondary to hypertension.  Otherwise, it was unremarkable.  The small vessel disease was not within the basal ganglia.  I showed her the films in the office today.   No Known Allergies  Current Outpatient Prescriptions on File Prior to Visit  Medication Sig Dispense Refill  . aspirin 81 MG tablet Take 81 mg by mouth daily.    . carbidopa-levodopa (SINEMET IR) 25-100 MG per tablet Take 1 tablet by mouth 3 (three) times daily. 270 tablet 1  . metoprolol tartrate (LOPRESSOR) 25 MG tablet Take 0.5 tablets (12.5 mg total) by mouth 2 (two) times daily as needed. (Patient taking differently: Take 12.5 mg by mouth 2 (two) times daily as needed. Takes when  HR>100) 180 tablet 3  . Multiple Vitamins-Calcium (ONE-A-DAY WOMENS PO) Take 1 tablet by mouth daily.    Marland Kitchen rOPINIRole (REQUIP) 2 MG tablet 2 tablets in the morning, 1 in the afternoon, 1 in the evening 360 tablet 1  . atorvastatin (LIPITOR) 20 MG tablet Take 1 tablet (20 mg total) by mouth daily. (Patient not taking: Reported on 11/03/2014) 90 tablet 3   No current facility-administered medications on file prior to visit.    Past Medical History  Diagnosis Date  . GLUCOSE INTOLERANCE 05/24/2008  . HYPERLIPIDEMIA 09/23/2006  . ANXIETY 09/23/2006  . DEPRESSION 09/23/2006  . CARPAL TUNNEL SYNDROME, BILATERAL 09/23/2006  . HYPERTENSION 09/23/2006  . GERD 05/24/2008  . DIVERTICULOSIS, COLON 09/23/2006  . IBS 09/23/2006  . BUNION, RIGHT FOOT 09/18/2009  . OSTEOPOROSIS 05/24/2008  . Abdominal pain, epigastric 09/18/2009  . Impaired glucose tolerance 11/04/2010  . Anosmia 11/06/2010  . Parkinson's disease ([place])   . Restrictive lung disease 12/20/2013    Past Surgical History  Procedure Laterality Date  . Tubal ligation    . Nasal septum repair    . S/p ganglion cyst right wrist    . S/p bilat cts    . S/p right thumb trigger finger surgury    . S/p fibroid ablation    . Esophagogastroduodenoscopy  May 03, 2006    Dr. Harriette Bouillon duodenal ulcer  . Colonoscopy w/ biopsies  05/03/06    Dr. Collene Mares -negative random bxs  . Cataract extraction    . Shoulder surgery Right 05-02-2013  . Foot surgery Bilateral 2013-05-02  . Cardiac catheterization    . Left and right heart catheterization with coronary angiogram N/A 11/09/2013    Procedure: LEFT AND RIGHT HEART CATHETERIZATION WITH CORONARY ANGIOGRAM;  Surgeon: Peter M Martinique, MD;  Location: [facility] CATH LAB;  Service: Cardiovascular;  Laterality: N/A;    Social History   Social History  . Marital Status: Married    Spouse Name: N/A  . Number of Children: 2  . Years of Education: N/A   Occupational History  . Not on file.   Social History Main Topics  . Smoking status:  Never Smoker   . Smokeless tobacco: Never Used  . Alcohol Use: Yes     Comment: Occ  . Drug Use: No  . Sexual Activity:    Partners: Male    Birth Control/ Protection: Post-menopausal   Other Topics Concern  . Not on file   Social History Narrative    Family Status  Relation Status Death Age  . Mother Deceased     05-02-2001, PVD, [place], [place]  . Father Deceased 78    [place], hypertension since teens  . Child Alive     2, alive and well  . Maternal Grandmother Deceased     PD  . Brother Alive   . Brother Alive   . Brother Alive     Review of  Systems A complete 10 system ROS was obtained and was negative apart from what is mentioned.   Objective:   VITALS:   Filed Vitals:   11/03/14 1102  BP: 134/88  Pulse: 83  Height: 4\' 10"  (1.473 m)  Weight: 144 lb (65.318 kg)   Wt Readings from Last 3 Encounters:  11/03/14 144 lb (65.318 kg)  07/07/14 146 lb (66.225 kg)  07/04/14 145 lb (65.772 kg)    GEN:  The patient appears stated age (actually younger) and is in NAD. HEENT:  Normocephalic, atraumatic.  The mucous membranes are moist. The superficial temporal arteries are without ropiness or tenderness. CV:  RRR Lungs:  CTAB Neck/HEME:  There are no carotid bruits bilaterally. Exts:  No edema noted  Neurological examination:  Orientation: The patient is alert and oriented x3. Fund of knowledge is appropriate.  Recent and remote memory are intact.  Attention and concentration are normal.    Able to name objects and repeat phrases. Cranial nerves: There is good facial symmetry.   Extraocular muscles are intact.  There are no square wave jerks. The speech is fluent and clear. Soft palate rises symmetrically and there is no tongue deviation. Hearing is intact to conversational tone. Sensation: Sensation is intact to light to light touch throughout. Motor: Strength is 5/5 in the bilateral upper and lower extremities.   Shoulder shrug is equal and symmetric.  There is no pronator  drift.   Movement examination: Tone: There is mild to mod increased tone in the LUE and mild in the right upper extremity.  Abnormal movements: There was no tremor today even with distraction procedures. Coordination:  There is just slight decreased finger taps on the L and decreased hand opening and closing Gait and Station:    Her gait had a fairly normal stride length today.  Arm swing was good.  She is stable today.  She has a negative pull test  LABS:  Lab Results  Component Value Date   WBC 7.7 07/07/2014   HGB 15.1* 07/07/2014   HCT 44.4 07/07/2014   MCV 87.9 07/07/2014   PLT 337.0 07/07/2014     Chemistry      Component Value Date/Time   NA 137 07/07/2014 1225   K 3.7 07/07/2014 1225   CL 99 07/07/2014 1225   CO2 31 07/07/2014 1225   BUN 21 07/07/2014 1225   CREATININE 0.91 07/07/2014 1225      Component Value Date/Time   CALCIUM 10.1 07/07/2014 1225   ALKPHOS 76 07/07/2014 1225   AST 28 07/07/2014 1225   ALT 8 07/07/2014 1225   BILITOT 0.5 07/07/2014 1225     Lab Results  Component Value Date   VITAMINB12 443 11/26/2011   Lab Results  Component Value Date   FOLATE 13.5 11/26/2011   Lab Results  Component Value Date   TSH 1.14 07/07/2014        Assessment/Plan:   1.  Akinetic rigid Parkinson's disease.  -continue requip 2mg : 4mg /2mg /2mg  for a total of 8 mg per day.    -continue carbidopa/levodopa 25/100, one tablet tid (started levodopa 04/2014).  She was taking the last dose at bedtime and I asked her to move that dosage closer to dinner.  Feels better on this.  Risks, benefits, side effects and alternative therapies were discussed.  The opportunity to ask questions was given and they were answered to the best of my ability.  The patient expressed understanding and willingness to follow the outlined treatment protocols. 2.  Fatigue  -Is improved somewhat and feels better than she did  3.  Anxiety with somatic sx'x  -back on Lexapro.  She attempted  to discontinue it, but had somatic symptoms return that she associated with anxiety 4.  Constipation  -copy of rancho recipe given today 5. Much greater than 50% of this visit was spent in counseling with the patient and the family.  Total face to face time:  25 min

## 2014-11-03 NOTE — Patient Instructions (Signed)
Constipation and Parkinson's disease:  1.Rancho recipe for constipation in Parkinsons Disease:  -1 cup of bran, 2 cups of applesauce in 1 cup of prune juice 2.  Increase fiber intake (Metamucil,vegetables) 3.  Regular, moderate exercise can be beneficial. 4.  Avoid medications causing constipation, such as medications like antacids with calcium or magnesium 5.  Laxative overuse should be avoided. 6.  Stool softeners (Colace) can help with chronic constipation.  

## 2014-11-17 ENCOUNTER — Telehealth: Payer: Self-pay | Admitting: Neurology

## 2014-11-17 MED ORDER — CARBIDOPA-LEVODOPA 25-100 MG PO TABS: 1.0000 | ORAL_TABLET | Freq: Three times a day (TID) | ORAL | Status: DC

## 2014-11-17 MED ORDER — ROPINIROLE HCL 2 MG PO TABS: ORAL_TABLET | ORAL | Status: DC

## 2014-11-17 NOTE — Telephone Encounter (Signed)
PT called in regards to her medications carbadopa and ropinirole and would like a call back at [phone]

## 2014-11-17 NOTE — Telephone Encounter (Signed)
Patient states refills were not received from [facility]. Apologized and sent in refills of Carbidopa Levodopa 25/100 and Requip. She will call with any problems/questions.

## 2014-11-30 DIAGNOSIS — Z23 Encounter for immunization: Secondary | ICD-10-CM | POA: Diagnosis not present

## 2015-02-01 ENCOUNTER — Ambulatory Visit (INDEPENDENT_AMBULATORY_CARE_PROVIDER_SITE_OTHER): Payer: Medicare Other | Admitting: Internal Medicine

## 2015-02-01 VITALS — BP 146/84 | HR 112 | Temp 98.6°F | Ht <= 58 in | Wt 144.0 lb

## 2015-02-01 DIAGNOSIS — M8580 Other specified disorders of bone density and structure, unspecified site: Secondary | ICD-10-CM

## 2015-02-01 DIAGNOSIS — E785 Hyperlipidemia, unspecified: Secondary | ICD-10-CM | POA: Diagnosis not present

## 2015-02-01 DIAGNOSIS — I10 Essential (primary) hypertension: Secondary | ICD-10-CM | POA: Diagnosis not present

## 2015-02-01 DIAGNOSIS — R7302 Impaired glucose tolerance (oral): Secondary | ICD-10-CM | POA: Diagnosis not present

## 2015-02-01 DIAGNOSIS — K921 Melena: Secondary | ICD-10-CM

## 2015-02-01 DIAGNOSIS — Z1239 Encounter for other screening for malignant neoplasm of breast: Secondary | ICD-10-CM

## 2015-02-01 DIAGNOSIS — M810 Age-related osteoporosis without current pathological fracture: Secondary | ICD-10-CM

## 2015-02-01 NOTE — Progress Notes (Signed)
Subjective:    Patient ID: Destiny Roth, female    DOB: [birth year], 65 y.o.   MRN: [id number]  HPI  Here for yearly f/u;  Overall doing ok;  Pt denies Chest pain, worsening SOB, DOE, wheezing, orthopnea, PND, worsening LE edema, palpitations, dizziness or syncope, except has some difficulty with deep breaths and voice stops before end of sentences sometimes, has hx of restrictive lung dz..  Pt denies neurological change such as new headache, facial or extremity weakness.  Pt denies polydipsia, polyuria, or low sugar symptoms. Pt states overall good compliance with treatment and medications, good tolerability, and has been trying to follow appropriate diet.  Pt denies worsening depressive symptoms, suicidal ideation or panic. No fever, night sweats, wt loss, loss of appetite, or other constitutional symptoms.  Pt states good ability with ADL's, has low fall risk, home safety reviewed and adequate, no other significant changes in hearing or vision.  Sees neuro on regular basis. Has also had some intermittent small volume BRBPR, asks for f/u with GI, Dr Henrene Pastor.  Due for dxa, mammo Wt Readings from Last 3 Encounters:  02/01/15 144 lb (65.318 kg)  11/03/14 144 lb (65.318 kg)  07/07/14 146 lb (66.225 kg)   Past Medical History  Diagnosis Date  . GLUCOSE INTOLERANCE 05/24/2008  . HYPERLIPIDEMIA 09/23/2006  . ANXIETY 09/23/2006  . DEPRESSION 09/23/2006  . CARPAL TUNNEL SYNDROME, BILATERAL 09/23/2006  . HYPERTENSION 09/23/2006  . GERD 05/24/2008  . DIVERTICULOSIS, COLON 09/23/2006  . IBS 09/23/2006  . BUNION, RIGHT FOOT 09/18/2009  . OSTEOPOROSIS 05/24/2008  . Abdominal pain, epigastric 09/18/2009  . Impaired glucose tolerance 11/04/2010  . Anosmia 11/06/2010  . Parkinson's disease ([place])   . Restrictive lung disease 12/20/2013   Past Surgical History  Procedure Laterality Date  . Tubal ligation    . Nasal septum repair    . S/p ganglion cyst right wrist    . S/p bilat cts    . S/p right thumb  trigger finger surgury    . S/p fibroid ablation    . Esophagogastroduodenoscopy  2008    Dr. Harriette Bouillon duodenal ulcer  . Colonoscopy w/ biopsies  2008    Dr. Collene Mares -negative random bxs  . Cataract extraction    . Shoulder surgery Right 2015  . Foot surgery Bilateral 2015  . Cardiac catheterization    . Left and right heart catheterization with coronary angiogram N/A 11/09/2013    Procedure: LEFT AND RIGHT HEART CATHETERIZATION WITH CORONARY ANGIOGRAM;  Surgeon: Peter M Martinique, MD;  Location: [facility] CATH LAB;  Service: Cardiovascular;  Laterality: N/A;    reports that she has never smoked. She has never used smokeless tobacco. She reports that she drinks alcohol. She reports that she does not use illicit drugs. family history includes Heart attack in her brother and mother; Heart disease (age of onset: 32) in her father; Hypertension in her brother, brother, brother, and father; Peripheral vascular disease in her mother. Allergies  Allergen Reactions  . Lipitor [Atorvastatin] Other (See Comments)    myalgia  . Zocor [Simvastatin] Other (See Comments)    myalgias   Current Outpatient Prescriptions on File Prior to Visit  Medication Sig Dispense Refill  . aspirin 81 MG tablet Take 81 mg by mouth daily.    . carbidopa-levodopa (SINEMET IR) 25-100 MG tablet Take 1 tablet by mouth 3 (three) times daily. 270 tablet 1  . escitalopram (LEXAPRO) 10 MG tablet Take 10 mg by mouth daily.     Marland Kitchen  metoprolol tartrate (LOPRESSOR) 25 MG tablet Take 0.5 tablets (12.5 mg total) by mouth 2 (two) times daily as needed. (Patient taking differently: Take 12.5 mg by mouth 2 (two) times daily as needed. Takes when HR>100) 180 tablet 3  . Multiple Vitamins-Calcium (ONE-A-DAY WOMENS PO) Take 1 tablet by mouth daily.    Marland Kitchen rOPINIRole (REQUIP) 2 MG tablet 2 tablets in the morning, 1 in the afternoon, 1 in the evening 360 tablet 1  . atorvastatin (LIPITOR) 20 MG tablet Take 1 tablet (20 mg total) by mouth daily. (Patient  not taking: Reported on 11/03/2014) 90 tablet 3   No current facility-administered medications on file prior to visit.    Review of Systems Constitutional: Negative for increased diaphoresis, other activity, appetite or siginficant weight change other than noted HENT: Negative for worsening hearing loss, ear pain, facial swelling, mouth sores and neck stiffness.   Eyes: Negative for other worsening pain, redness or visual disturbance.  Respiratory: Negative for shortness of breath and wheezing  Cardiovascular: Negative for chest pain and palpitations.  Gastrointestinal: Negative for diarrhea, blood in stool, abdominal distention or other pain Genitourinary: Negative for hematuria, flank pain or change in urine volume.  Musculoskeletal: Negative for myalgias or other joint complaints.  Skin: Negative for color change and wound or drainage.  Neurological: Negative for syncope and numbness. other than noted Hematological: Negative for adenopathy. or other swelling Psychiatric/Behavioral: Negative for hallucinations, SI, self-injury, decreased concentration or other worsening agitation.      Objective:   Physical Exam BP 146/84 mmHg  Pulse 112  Temp(Src) 98.6 F (37 C) (Oral)  Ht 4\' 10"  (1.473 m)  Wt 144 lb (65.318 kg)  BMI 30.10 kg/m2  SpO2 94% VS noted,  Constitutional: Pt is oriented to person, place, and time. Appears well-developed and well-nourished, in no significant distress Head: Normocephalic and atraumatic.  Right Ear: External ear normal.  Left Ear: External ear normal.  Nose: Nose normal.  Mouth/Throat: Oropharynx is clear and moist.  Eyes: Conjunctivae and EOM are normal. Pupils are equal, round, and reactive to light.  Neck: Normal range of motion. Neck supple. No JVD present. No tracheal deviation present or significant neck LA or mass Cardiovascular: Normal rate, regular rhythm, normal heart sounds and intact distal pulses.   Pulmonary/Chest: Effort normal and  breath sounds without rales or wheezing  Abdominal: Soft. Bowel sounds are normal. NT. No HSM  Musculoskeletal: Normal range of motion. Exhibits no edema.  Lymphadenopathy:  Has no cervical adenopathy.  Neurological: Pt is alert and oriented to person, place, and time. Pt has normal reflexes. No cranial nerve deficit. Motor grossly intact, o/w not done in detail Skin: Skin is warm and dry. No rash noted.  Psychiatric:  Has normal mood and affect. Behavior is normal.      Assessment & Plan:

## 2015-02-01 NOTE — Progress Notes (Signed)
Pre visit review using our clinic review tool, if applicable. No additional management support is needed unless otherwise documented below in the visit note. 

## 2015-02-01 NOTE — Patient Instructions (Addendum)
Please continue all other medications as before, and refills have been done if requested.  Please have the pharmacy call with any other refills you may need.  Please continue your efforts at being more active, low cholesterol diet, and weight control.  You are otherwise up to date with prevention measures today.  Please keep your appointments with your specialists as you may have planned  You will be contacted regarding the referral for: Gastroenterology  - Dr Henrene Pastor  Please schedule the bone density test before leaving today at the scheduling desk (where you check out)  You will be contacted regarding the referral for: mammogram  Please return in 6 months, or sooner if needed

## 2015-02-02 ENCOUNTER — Telehealth: Payer: Self-pay | Admitting: Internal Medicine

## 2015-02-02 NOTE — Telephone Encounter (Signed)
Sure thing. Just as a note saw her once in 2013 and did not f/u.

## 2015-02-02 NOTE — Telephone Encounter (Signed)
Ok

## 2015-02-02 NOTE — Telephone Encounter (Signed)
Dr. Carlean Purl do you agree with transfer?

## 2015-02-04 NOTE — Assessment & Plan Note (Signed)
stable overall by history and exam, recent data reviewed with pt, and pt to continue medical treatment as before,  to f/u any worsening symptoms or concerns Lab Results  Component Value Date   LDLCALC 76 12/29/2012    

## 2015-02-04 NOTE — Assessment & Plan Note (Signed)
stable overall by history and exam, recent data reviewed with pt, and pt to continue medical treatment as before,  to f/u any worsening symptoms or concerns Lab Results  Component Value Date   HGBA1C 5.3 07/07/2014

## 2015-02-04 NOTE — Assessment & Plan Note (Signed)
stable overall by history and exam, recent data reviewed with pt, and pt to continue medical treatment as before,  to f/u any worsening symptoms or concerns BP Readings from Last 3 Encounters:  02/01/15 146/84  11/03/14 134/88  07/07/14 122/84

## 2015-02-04 NOTE — Assessment & Plan Note (Signed)
For GI referral, labs today,  to f/u any worsening symptoms or concerns

## 2015-02-07 ENCOUNTER — Ambulatory Visit (INDEPENDENT_AMBULATORY_CARE_PROVIDER_SITE_OTHER)
Admission: RE | Admit: 2015-02-07 | Discharge: 2015-02-07 | Disposition: A | Discharge status: Home / Self Care | Payer: Medicare Other | Source: Ambulatory Visit | Attending: Internal Medicine | Admitting: Internal Medicine

## 2015-02-07 DIAGNOSIS — M810 Age-related osteoporosis without current pathological fracture: Secondary | ICD-10-CM | POA: Diagnosis not present

## 2015-02-14 ENCOUNTER — Other Ambulatory Visit: Payer: Self-pay | Admitting: Internal Medicine

## 2015-02-14 MED ORDER — ALENDRONATE SODIUM 70 MG PO TABS: 70.0000 mg | ORAL_TABLET | ORAL | Status: DC

## 2015-02-16 ENCOUNTER — Telehealth: Payer: Self-pay | Admitting: Internal Medicine

## 2015-02-16 ENCOUNTER — Encounter: Payer: Self-pay | Admitting: Internal Medicine

## 2015-02-16 NOTE — Telephone Encounter (Signed)
-----   Message from Lyman Bishop, [place] sent at 02/16/2015  9:19 AM EST ----- Pt advised and states she took Actonel for 5 years previously and is concerned to be starting Fosamax, please advise

## 2015-02-16 NOTE — Telephone Encounter (Signed)
Pt advised.

## 2015-02-16 NOTE — Telephone Encounter (Signed)
Ok to hold on the fosamax, as she has already had the 5 yrs of actonel  OK to cont the calcium/vit for now  Will need to consider f/u bone density at 2-3 yrs.   If worsening, she may want to start Prolia at that time

## 2015-02-17 ENCOUNTER — Other Ambulatory Visit: Payer: Self-pay | Admitting: Internal Medicine

## 2015-02-17 DIAGNOSIS — Z1231 Encounter for screening mammogram for malignant neoplasm of breast: Secondary | ICD-10-CM

## 2015-02-24 ENCOUNTER — Ambulatory Visit (INDEPENDENT_AMBULATORY_CARE_PROVIDER_SITE_OTHER): Payer: Medicare Other | Admitting: Family

## 2015-02-24 ENCOUNTER — Other Ambulatory Visit: Payer: Self-pay | Admitting: Internal Medicine

## 2015-02-24 ENCOUNTER — Encounter: Payer: Self-pay | Admitting: Family

## 2015-02-24 VITALS — BP 152/92 | HR 106 | Temp 97.7°F | Resp 16 | Ht <= 58 in | Wt 143.0 lb

## 2015-02-24 DIAGNOSIS — R0989 Other specified symptoms and signs involving the circulatory and respiratory systems: Secondary | ICD-10-CM | POA: Insufficient documentation

## 2015-02-24 DIAGNOSIS — Z1231 Encounter for screening mammogram for malignant neoplasm of breast: Secondary | ICD-10-CM

## 2015-02-24 DIAGNOSIS — Z1239 Encounter for other screening for malignant neoplasm of breast: Secondary | ICD-10-CM

## 2015-02-24 MED ORDER — CEFUROXIME AXETIL 500 MG PO TABS: 500.0000 mg | ORAL_TABLET | Freq: Two times a day (BID) | ORAL | Status: DC

## 2015-02-24 MED ORDER — HYDROCOD POLST-CPM POLST ER 10-8 MG/5ML PO SUER: 5.0000 mL | Freq: Every evening | ORAL | Status: DC | PRN

## 2015-02-24 NOTE — Progress Notes (Signed)
Subjective:    Patient ID: Destiny Roth, female    DOB: 1950-04-15, 65 y.o.   MRN: [id number]  Chief Complaint  Patient presents with  . Cough    has had a fever, productve cough, chest congestion, and swollen lymph nodes in neck    HPI:  Destiny Roth is a 65 y.o. female who  has a past medical history of GLUCOSE INTOLERANCE (05/24/2008); HYPERLIPIDEMIA (09/23/2006); ANXIETY (09/23/2006); DEPRESSION (09/23/2006); CARPAL TUNNEL SYNDROME, BILATERAL (09/23/2006); HYPERTENSION (09/23/2006); GERD (05/24/2008); DIVERTICULOSIS, COLON (09/23/2006); IBS (09/23/2006); BUNION, RIGHT FOOT (09/18/2009); OSTEOPOROSIS (05/24/2008); Abdominal pain, epigastric (09/18/2009); Impaired glucose tolerance (11/04/2010); Anosmia (11/06/2010); Parkinson's disease ([place]); and Restrictive lung disease (12/20/2013). and presents today for an acute office visit.  This is a new problem. Associated symptoms fevers,chest congestion, productive cough, and swollen lymph nodes that has been going on for about 6 days. Modifying factors include Claritin-D and Tylenol which has not helped with her symptoms very much. Cough is signifcant enough to keep her up at night. Denies recent antibiotic use. Course of the symptoms has progressively worsened.   Allergies  Allergen Reactions  . Lipitor [Atorvastatin] Other (See Comments)    myalgia  . Zocor [Simvastatin] Other (See Comments)    myalgias     Current Outpatient Prescriptions on File Prior to Visit  Medication Sig Dispense Refill  . aspirin 81 MG tablet Take 81 mg by mouth daily.    . carbidopa-levodopa (SINEMET IR) 25-100 MG tablet Take 1 tablet by mouth 3 (three) times daily. 270 tablet 1  . escitalopram (LEXAPRO) 10 MG tablet Take 10 mg by mouth daily.     . metoprolol tartrate (LOPRESSOR) 25 MG tablet Take 0.5 tablets (12.5 mg total) by mouth 2 (two) times daily as needed. (Patient taking differently: Take 12.5 mg by mouth 2 (two) times daily as needed. Takes when  HR>100) 180 tablet 3  . Multiple Vitamins-Calcium (ONE-A-DAY WOMENS PO) Take 1 tablet by mouth daily.    Marland Kitchen rOPINIRole (REQUIP) 2 MG tablet 2 tablets in the morning, 1 in the afternoon, 1 in the evening 360 tablet 1   No current facility-administered medications on file prior to visit.    Review of Systems  Constitutional: Positive for fever. Negative for chills.  HENT: Positive for congestion and sore throat. Negative for sinus pressure.   Respiratory: Positive for cough. Negative for chest tightness and shortness of breath.   Cardiovascular: Negative for leg swelling.  Neurological: Negative for headaches.      Objective:    BP 152/92 mmHg  Pulse 106  Temp(Src) 97.7 F (36.5 C) (Oral)  Resp 16  Ht 4\' 10"  (1.473 m)  Wt 143 lb (64.864 kg)  BMI 29.89 kg/m2  SpO2 97% Nursing note and vital signs reviewed.  Physical Exam  Constitutional: She is oriented to person, place, and time. She appears well-developed and well-nourished. No distress.  HENT:  Right Ear: Hearing, tympanic membrane, external ear and ear canal normal.  Left Ear: Hearing, tympanic membrane, external ear and ear canal normal.  Nose: Nose normal. Right sinus exhibits no maxillary sinus tenderness and no frontal sinus tenderness. Left sinus exhibits no maxillary sinus tenderness and no frontal sinus tenderness.  Mouth/Throat: Uvula is midline and mucous membranes are normal. Posterior oropharyngeal erythema present.  Cardiovascular: Normal rate, regular rhythm, normal heart sounds and intact distal pulses.   Pulmonary/Chest: Effort normal and breath sounds normal.  Neurological: She is alert and oriented to person, place, and time.  Skin: Skin  is warm and dry.  Psychiatric: She has a normal mood and affect. Her behavior is normal. Judgment and thought content normal.       Assessment & Plan:   Problem List Items Addressed This Visit      Respiratory   Chest congestion - Primary    Symptoms and exam  consistent with bacterial respiratory infection. Start Ceftin. Start Tessalon as needed for cough and sleep. Continue over-the-counter medications as needed for symptom relief and supportive care. Follow-up if symptoms worsen or fail to improve.      Relevant Medications   cefUROXime (CEFTIN) 500 MG tablet   chlorpheniramine-HYDROcodone (TUSSIONEX PENNKINETIC ER) 10-8 MG/5ML SUER

## 2015-02-24 NOTE — Progress Notes (Signed)
Pre visit review using our clinic review tool, if applicable. No additional management support is needed unless otherwise documented below in the visit note. 

## 2015-02-24 NOTE — Assessment & Plan Note (Signed)
Symptoms and exam consistent with bacterial respiratory infection. Start Ceftin. Start Tessalon as needed for cough and sleep. Continue over-the-counter medications as needed for symptom relief and supportive care. Follow-up if symptoms worsen or fail to improve.

## 2015-02-24 NOTE — Patient Instructions (Signed)
Thank you for choosing [facility].  Summary/Instructions:  Your prescription(s) have been submitted to your pharmacy or been printed and provided for you. Please take as directed and contact our office if you believe you are having problem(s) with the medication(s) or have any questions.  If your symptoms worsen or fail to improve, please contact our office for further instruction, or in case of emergency go directly to the emergency room at the closest medical facility.   General Recommendations:    Please drink plenty of fluids.  Get plenty of rest   Sleep in humidified air  Use saline nasal sprays  Netti pot   OTC Medications:  Decongestants - helps relieve congestion   Flonase (generic fluticasone) or Nasacort (generic triamcinolone) - please make sure to use the "cross-over" technique at a 45 degree angle towards the opposite eye as opposed to straight up the nasal passageway.   Sudafed (generic pseudoephedrine - Note this is the one that is available behind the pharmacy counter); Products with phenylephrine (-PE) may also be used but is often not as effective as pseudoephedrine.   If you have HIGH BLOOD PRESSURE - Coricidin HBP; AVOID any product that is -D as this contains pseudoephedrine which may increase your blood pressure.  Afrin (oxymetazoline) every 6-8 hours for up to 3 days.   Allergies - helps relieve runny nose, itchy eyes and sneezing   Claritin (generic loratidine), Allegra (fexofenidine), or Zyrtec (generic cyrterizine) for runny nose. These medications should not cause drowsiness.  Note - Benadryl (generic diphenhydramine) may be used however may cause drowsiness  Cough -   Delsym or Robitussin (generic dextromethorphan)  Expectorants - helps loosen mucus to ease removal   Mucinex (generic guaifenesin) as directed on the package.  Headaches / General Aches   Tylenol (generic acetaminophen) - DO NOT EXCEED 3 grams (3,000 mg) in a 24  hour time period  Advil/Motrin (generic ibuprofen)   Sore Throat -   Salt water gargle   Chloraseptic (generic benzocaine) spray or lozenges / Sucrets (generic dyclonine)      

## 2015-02-27 DIAGNOSIS — Z1231 Encounter for screening mammogram for malignant neoplasm of breast: Secondary | ICD-10-CM | POA: Diagnosis not present

## 2015-02-27 LAB — HM MAMMOGRAPHY

## 2015-02-28 ENCOUNTER — Encounter: Payer: Self-pay | Admitting: Internal Medicine

## 2015-03-07 ENCOUNTER — Ambulatory Visit (INDEPENDENT_AMBULATORY_CARE_PROVIDER_SITE_OTHER): Payer: Medicare Other | Admitting: Neurology

## 2015-03-07 ENCOUNTER — Encounter: Payer: Self-pay | Admitting: Neurology

## 2015-03-07 VITALS — BP 128/78 | HR 118 | Ht <= 58 in | Wt 142.0 lb

## 2015-03-07 DIAGNOSIS — G20 Parkinson's disease: Secondary | ICD-10-CM

## 2015-03-07 DIAGNOSIS — G4700 Insomnia, unspecified: Secondary | ICD-10-CM

## 2015-03-07 DIAGNOSIS — F330 Major depressive disorder, recurrent, mild: Secondary | ICD-10-CM

## 2015-03-07 MED ORDER — CARBIDOPA-LEVODOPA ER 25-100 MG PO TBCR: 1.0000 | EXTENDED_RELEASE_TABLET | Freq: Every day | ORAL | Status: DC

## 2015-03-07 NOTE — Patient Instructions (Addendum)
1.  Start melatonin 3 mg at night. 2.  Continue current meds as you are but add carbidopa/levodopa CR 25/100 at bedtime

## 2015-03-07 NOTE — Progress Notes (Signed)
Subjective:   Destiny Roth was seen in f/u for PD diagnosed 10/2011.  The patient is a 65 y.o. right handed female with a history of tremor.Onset of symptoms was at end of 2012.   She has noted a gradual loss of smell and taste over the last 20 years.  She even had a rhinoplasty and septoplasty in 1993 to see if that would help and it did not.  She can smell almost nothing now.    She is on Mirapex ER 1.5 mg daily.  The patient states that the tremor is virtually gone, with the exception of when she is stressed at work and then she will notice tremor in both legs.  Her walking is much better.  She feels more stable.  .  She does have some dizziness in the early morning and is unsure if that is related to Mirapex, her blood pressure medication or potassium.  No compulsive behaviors have been noted with the Mirapex.  There have been no sleep attacks.  She has not yet started exercising.  She is going to be changing jobs which is stressful for her.  10/12/12 update:  She has had some tremor internally and in the legs.  She notices some word finding trouble.   She has been under increased stress.  Her husband had back surgery 2 weeks ago and she has been the primary caregiver.  In addition, she is between jobs and needs to find a new job.  She would like to see if she can get off her blood pressure medication.  02/08/13 update:  Last visit, I increased the patient's Mirapex to a total of 2.25 mg daily.  The patient states that it definitely helped the tremor.  She almost never sees tremor any more.  She is, however, complaining about weight gain.  Her weight has been stable for many months, but when we looked back she gained weight between her May, 2014 visit and her September, 2014 visit.  She is frustrated that she cannot get that off.  In addition, she complains of some low energy and vaginal dryness.  I did look at her prior medical records since last visit.  She was complaining to her primary care  physician about dizziness and her blood pressure medication was decreased.  She did see Dr. Elsworth Soho since last visit and a split-night sleep study was recommended.  She states that she is scheduled to have foot surgery for a bunion on February 17 2013.  She had a procedure this morning to "break up a calcification" in the right shoulder.  06/07/13 update:  Pt is f/u today.  The records that were made available to me were reviewed since last visit.  Pt had surgery on both feet on separate dates for bunyionectomies.  Had tachycardia after the first and ended up having a holter monitor but HR was normal on the monitor.  Diltiazem was decreased, pindolol was added.  She had a PSG done since last visit and had an AHI of 12.   No CPAP has been recommended at this point.  She also had a R shoulder surgery on April 7 but those records are not available to me.  She does c/o if bending over, she will have lightheadedness but separate from that she has had 2 episodes of vertigo (room spinning).  Only tremor in the R hand if she has to hold out the arm that she has surgery on and then she experiences pain, so she is  not sure if the tremor is related to the pain or the surgery.  She is planning to go to [place] in about 5 weeks and is worried that she will not feel better by then.  Overall, since the arm surgery and feet surgery, she just does not feel back to herself.  She has not been able to exercise.  10/08/13 update:  The patient is following up today regarding her Parkinson's disease.  Last visit, I increased her Mirapex to 3.0 mg daily.  She is also on Cymbalta.  She has been having more fatigue and shortness of breath.  She just had a nuclear stress test done that was normal.  Her left ventricular ejection fraction was 77%.  She denies falls.  No syncope.  She is not sleepy, just fatigued during the day.  Saw Dr. Harrington Challenger today and increasing diltiazem and metoprolol is being decreased.  02/09/14 update:  The records that were  made available to me were reviewed since last visit.  Pt is accompanied by her husband who supplements the history today (never been present before).  Pt is supposed to be on mirapex 1.0 mg tid but has backed down to bid dosing.  I had to change this from the 3.0 mg ER version last visit because of insurance.  She has been to several other providers since last visit and I have discussed cases with several providers.  She has had an extensive cardiopulmonary workup for fatigue and shortness of breath.  She had an echocardiogram, stress Myoview and cardiac catheterization that were all unremarkable.  She does have mild to moderate obstructive sleep apnea syndrome, but refuses CPAP.  She saw Dr. Coralyn Mark for the sensation of being hot.  She has had an extensive workup for this as well.  No etiology has been determined.  She did go off of the Cymbalta to see if that would be of benefit.  She has been off of it since end of November and that hasn't changed.  No sweating at all.  Just feels like she has a "hot flash" without sweating.  Not sure that depression got worse going off of it but not sure if deals with things as well but also thinks that had SE with the cymbalta.  Pt does think that the mirapex may be causing some of the above issues.  She has been having LE swelling and hand edema as well.  She states that she has a "brain fog" that she thinks is due to the med.  She has a diffuse weakness and an inability to exercise due to SOB.  Again, she wonders if it is all due to mirapex.    03/04/14 update:  I held the patients mirapex for a few days and the patient felt that hot flashes and edema were better but not surprisingly, the PD symptoms got much worse.   She was glad that she did get the opportunity to see how she was off of medication but admits that it scared her to see how bad things got.   Decided to start requip and she is on 2 mg tid.  She still has some trouble with fine motor movements.  The records  that were made available to me were reviewed.  She saw Dr. Jenny Reichmann and since she still needed medication for depression after she d/c the cymbalta, she was put on lexapro.  She has only been on that since 02/15/14.  That is helping.  She is in pulmonary rehab and  that is helping greatly.  She has also d/c her antihypertensives except her HCTZ as she still has some peripheral edema.    05/13/14 update:  The patient is following up today a little bit sooner than expected.  Last visit, she was on Requip, 2 mg 3 times a day and she did not want to increase the dosage, even though she was slightly underdosed.  She did call me on April 7 complaining of increased rigidity and we decided to increase her dosage to 4 mg in the morning, 2 mg in the afternoon and 2 mg in the evening.  She called me just a few days ago to tell me that she still did not feel well and that is why she is following up.  Pt states that the increase may have helped somewhat but not fully.  She states that "I want to know that I am on the best medication for me."  Every few days, "I feel like I am going into a crisis."  She will have difficulty with washing her hair, tremor, manipulating her hands.  She has other issues that she is unsure are unrelated to PD.  She has "IBS" with diarrhea up to 15 times a day.  She has pain on the bottom of the feet.  She had a formal easter dinner at her house and spent all of the previous Friday (13 hours) preparing as she knew it would take that long.  She c/o swelling and weight gain  07/04/14 update:  The patient is following up today.  She is on ropinirole 2 mg and takes 2 tablets in the morning, one in the afternoon and 1 in evening.  Last visit, I started her on carbidopa/levodopa 25/100, one tablet 3 times per day.  Today, the patient states that she is markedly better.  She no longer has the internal shaking and overall just feels better.  She is still frustrated about her weight and having trouble losing it.     She denies any lightheadedness, syncope, hallucinations, visual distortions, falls.  She still has some foot pain but is back to wearing her heels.  She does state that she tires easily.  Pulm rehab is complete and she states that it made a huge difference.  She feels that her breathing is much better.    11/03/14 update:  The patient is following up today regarding her Parkinson's disease.  She is on Requip, 4 mg in the morning, 2 mg in the afternoon and 2 mg in the evening.  She is on carbidopa/levodopa 25/100, one tablet 3 times per day.  I reviewed records since our last visit.  Her primary care physician tapered her off of her Lexapro in June, as requested by the patient.  She states that when she was off of it she had anxiety sx's that were somatic and went back on lexapro 10 mg and cut it in 1/2.  She states that "it has been a good summer."  She is going to [place] in December and camping next week.  She has had some alternating constipation/diarrhea but diarrhea related to eating things like fruit.  No falls.  03/07/15 update:   The patient is following up today regarding her Parkinson's disease.  She is on Requip, 4 mg in the morning, 2 mg in the afternoon and 2 mg in the evening.  She is on carbidopa/levodopa 25/100, one tablet 3 times per day.  She did try to move the end of the day  dose closer to dinner and away from bed and felt terrible in the AM, so she is taking it in the AM.  However, she has to spread it out to do this.   Feels that she is a little more tremulous on the inside.  Feels that having some trouble sleeping. Asks about her URI sx's.  Hydrocodone didn't help and didn't help her sleep.  Cough is biggest problem.   Having GI issues (constipation, diarrhea, bleeding) and has upcoming appt with Dr. Henrene Pastor.   Still on Lexapro but states that battling "mild depression."   Associates that with "tired of being sick" with her URI and no longer working.  Is more physical and is walking for  exercise.     Current/Previously tried tremor medications: Klonopin  Current medications that may exacerbate tremor:  ?Cymbalta.  I reviewed the MRI of the brain done at the end of 2013 with her.  This revealed some mild small vessel disease, likely secondary to hypertension.  Otherwise, it was unremarkable.  The small vessel disease was not within the basal ganglia.  I showed her the films in the office today.   Allergies  Allergen Reactions  . Lipitor [Atorvastatin] Other (See Comments)    myalgia  . Zocor [Simvastatin] Other (See Comments)    myalgias    Current Outpatient Prescriptions on File Prior to Visit  Medication Sig Dispense Refill  . aspirin 81 MG tablet Take 81 mg by mouth daily.    . carbidopa-levodopa (SINEMET IR) 25-100 MG tablet Take 1 tablet by mouth 3 (three) times daily. 270 tablet 1  . escitalopram (LEXAPRO) 10 MG tablet Take 10 mg by mouth daily.     . metoprolol tartrate (LOPRESSOR) 25 MG tablet Take 0.5 tablets (12.5 mg total) by mouth 2 (two) times daily as needed. (Patient taking differently: Take 12.5 mg by mouth 2 (two) times daily as needed. Takes when HR>100) 180 tablet 3  . Multiple Vitamins-Calcium (ONE-A-DAY WOMENS PO) Take 1 tablet by mouth daily.    Marland Kitchen rOPINIRole (REQUIP) 2 MG tablet 2 tablets in the morning, 1 in the afternoon, 1 in the evening 360 tablet 1   No current facility-administered medications on file prior to visit.    Past Medical History  Diagnosis Date  . GLUCOSE INTOLERANCE 05/24/2008  . HYPERLIPIDEMIA 09/23/2006  . ANXIETY 09/23/2006  . DEPRESSION 09/23/2006  . CARPAL TUNNEL SYNDROME, BILATERAL 09/23/2006  . HYPERTENSION 09/23/2006  . GERD 05/24/2008  . DIVERTICULOSIS, COLON 09/23/2006  . IBS 09/23/2006  . BUNION, RIGHT FOOT 09/18/2009  . OSTEOPOROSIS 05/24/2008  . Abdominal pain, epigastric 09/18/2009  . Impaired glucose tolerance 11/04/2010  . Anosmia 11/06/2010  . Parkinson's disease ([place])   . Restrictive lung disease 12/20/2013     Past Surgical History  Procedure Laterality Date  . Tubal ligation    . Nasal septum repair    . S/p ganglion cyst right wrist    . S/p bilat cts    . S/p right thumb trigger finger surgury    . S/p fibroid ablation    . Esophagogastroduodenoscopy  2008    Dr. Harriette Bouillon duodenal ulcer  . Colonoscopy w/ biopsies  2008    Dr. Collene Mares -negative random bxs  . Cataract extraction    . Shoulder surgery Right 2015  . Foot surgery Bilateral 2015  . Cardiac catheterization    . Left and right heart catheterization with coronary angiogram N/A 11/09/2013    Procedure: LEFT AND RIGHT HEART CATHETERIZATION WITH CORONARY  Cyril Loosen;  Surgeon: Peter M Martinique, MD;  Location: [facility] CATH LAB;  Service: Cardiovascular;  Laterality: N/A;    Social History   Social History  . Marital Status: Married    Spouse Name: N/A  . Number of Children: 2  . Years of Education: N/A   Occupational History  . Not on file.   Social History Main Topics  . Smoking status: Never Smoker   . Smokeless tobacco: Never Used  . Alcohol Use: Yes     Comment: Occ  . Drug Use: No  . Sexual Activity:    Partners: Male    Birth Control/ Protection: Post-menopausal   Other Topics Concern  . Not on file   Social History Narrative    Family Status  Relation Status Death Age  . Mother Deceased     23-May-2001, PVD, [place], [place]  . Father Deceased 30    [place], hypertension since teens  . Child Alive     2, alive and well  . Maternal Grandmother Deceased     PD  . Brother Alive   . Brother Alive   . Brother Alive     Review of Systems A complete 10 system ROS was obtained and was negative apart from what is mentioned.   Objective:   VITALS:   Filed Vitals:   03/07/15 1114  BP: 128/78  Pulse: 118  Height: 4' 9.5" (1.461 m)  Weight: 142 lb (64.411 kg)   Wt Readings from Last 3 Encounters:  03/07/15 142 lb (64.411 kg)  02/24/15 143 lb (64.864 kg)  02/01/15 144 lb (65.318 kg)    GEN:  The patient appears  stated age (actually younger) and is in NAD. HEENT:  Normocephalic, atraumatic.  The mucous membranes are moist. The superficial temporal arteries are without ropiness or tenderness. CV:  RRR Lungs:  CTAB Neck/HEME:  There are no carotid bruits bilaterally. Exts:  No edema noted  Neurological examination:  Orientation: The patient is alert and oriented x3. Fund of knowledge is appropriate.  Recent and remote memory are intact.  Attention and concentration are normal.    Able to name objects and repeat phrases. Cranial nerves: There is good facial symmetry.   Extraocular muscles are intact.  There are no square wave jerks. The speech is fluent and clear. Soft palate rises symmetrically and there is no tongue deviation. Hearing is intact to conversational tone. Sensation: Sensation is intact to light to light touch throughout. Motor: Strength is 5/5 in the bilateral upper and lower extremities.   Shoulder shrug is equal and symmetric.  There is no pronator drift.   Movement examination: Tone: There is mild increased tone in the LUE and normal tone in the right upper extremity.  Abnormal movements: There was no tremor today even with distraction procedures. Coordination:  There is just slight decreased finger taps on the L and decreased hand opening and closing Gait and Station:    Her gait had a fairly normal stride length today.  Arm swing was good.  She is stable today.  She has a negative pull test  LABS:  Lab Results  Component Value Date   WBC 7.7 07/07/2014   HGB 15.1* 07/07/2014   HCT 44.4 07/07/2014   MCV 87.9 07/07/2014   PLT 337.0 07/07/2014     Chemistry      Component Value Date/Time   NA 137 07/07/2014 1225   K 3.7 07/07/2014 1225   CL 99 07/07/2014 1225   CO2 31 07/07/2014  1225   BUN 21 07/07/2014 1225   CREATININE 0.91 07/07/2014 1225      Component Value Date/Time   CALCIUM 10.1 07/07/2014 1225   ALKPHOS 76 07/07/2014 1225   AST 28 07/07/2014 1225   ALT 8  07/07/2014 1225   BILITOT 0.5 07/07/2014 1225     Lab Results  Component Value Date   VITAMINB12 443 11/26/2011   Lab Results  Component Value Date   FOLATE 13.5 11/26/2011   Lab Results  Component Value Date   TSH 1.14 07/07/2014        Assessment/Plan:   1.  Akinetic rigid Parkinson's disease.  -continue requip 2mg : 4mg /2mg /2mg  for a total of 8 mg per day.    -continue carbidopa/levodopa 25/100, one tablet tid (started levodopa 04/2014).  She was taking the last dose at bedtime and I asked her to move that dosage closer to dinner again.  I think that she gets tremulous because she spreads them too far apart as when she tried to move it away from bedtime she felt bad in the AM.  Risks, benefits, side effects and alternative therapies were discussed.  The opportunity to ask questions was given and they were answered to the best of my ability.  The patient expressed understanding and willingness to follow the outlined treatment protocols.  -add carbidopa/levodopa 25/100 CR at bedtime. This is low dose but trying to limit her levodopa load.   2.  Insomnia  -start melatonin, 3 mg nightly.   3.  Anxiety with somatic sx'x  -back on Lexapro.  She attempted to discontinue it, but had somatic symptoms return that she associated with anxiety 4.  Constipation  -copy of rancho recipe given today 5.  URI  -pt frustrated that she still isn't better.  Talked to her about tessalon perles but she is to f/u with PCP.  Asked me about prednisone but told her that I would defer to PCP. 6. Much greater than 50% of this visit was spent in counseling with the patient and the family.  Total face to face time:  25 min

## 2015-03-17 ENCOUNTER — Encounter: Payer: Self-pay | Admitting: Family

## 2015-03-17 MED ORDER — PREDNISONE 10 MG (21) PO TBPK: ORAL_TABLET | ORAL | Status: DC

## 2015-03-21 ENCOUNTER — Encounter: Payer: Self-pay | Admitting: Neurology

## 2015-03-27 DIAGNOSIS — Z961 Presence of intraocular lens: Secondary | ICD-10-CM | POA: Diagnosis not present

## 2015-04-11 ENCOUNTER — Encounter: Payer: Self-pay | Admitting: Internal Medicine

## 2015-04-11 ENCOUNTER — Ambulatory Visit (INDEPENDENT_AMBULATORY_CARE_PROVIDER_SITE_OTHER): Payer: Medicare Other | Admitting: Internal Medicine

## 2015-04-11 VITALS — BP 140/86 | HR 88 | Ht <= 58 in | Wt 143.0 lb

## 2015-04-11 DIAGNOSIS — K589 Irritable bowel syndrome without diarrhea: Secondary | ICD-10-CM | POA: Diagnosis not present

## 2015-04-11 DIAGNOSIS — R1084 Generalized abdominal pain: Secondary | ICD-10-CM

## 2015-04-11 DIAGNOSIS — K648 Other hemorrhoids: Secondary | ICD-10-CM

## 2015-04-11 MED ORDER — HYDROCORTISONE ACETATE 25 MG RE SUPP: 25.0000 mg | Freq: Every evening | RECTAL | Status: DC | PRN

## 2015-04-11 MED ORDER — DICYCLOMINE HCL 20 MG PO TABS: 20.0000 mg | ORAL_TABLET | Freq: Four times a day (QID) | ORAL | Status: DC | PRN

## 2015-04-11 NOTE — Progress Notes (Signed)
HISTORY OF PRESENT ILLNESS:  Destiny Roth is a 65 y.o. female who is self-referred regarding a myriad of GI complaints. She has long-standing GI complaints and is seen multiple gastroenterologists previously. Last evaluated in this office by Dr. Carlean Purl 2013. Evaluated by Dr. Collene Mares previously including colonoscopy and upper endoscopy in 2008. I have revealed multiple prior records including endoscopic evaluations, pathology, laboratories, imaging, and office notes. The patient's symptoms date back at least 20 years. She describes problems with abdominal cramping discomfort associated with urgency and loose stools. These issues persisted until 2013 when she was diagnosed with Parkinson's disease and placed on medical therapy. Thereafter has developed issues with constipation associated with bloating generally lasting 3 days followed by diarrhea with cramping and urgency also lasting several days. She may have several days in between where she feels well. She has gained 15 pounds over the past 2 years. Occasional problems with perianal soreness with bleeding secondary to known hemorrhoids. Complete colonoscopy with ileal intubation in 2008 was negative including random biopsies. As well upper endoscopy was normal with duodenal biopsies. No evidence for celiac disease or microscopic colitis. Evaluation by Dr. Carlean Purl 2013 patient was felt to have irritable bowel syndrome. Her symptoms are really the same. She comes in stating that she has no idea what is her diagnosis, the severity of her condition, and what she can do to help her symptoms. No family history of inflammatory bowel disease or gastrointestinal malignancy  REVIEW OF SYSTEMS:  All non-GI ROS negative except for anxiety, back pain, visual change, depression, fatigue, sleeping problems, ankle swelling, urinary leakage  Past Medical History  Diagnosis Date  . GLUCOSE INTOLERANCE 05/24/2008  . HYPERLIPIDEMIA 09/23/2006  . ANXIETY 09/23/2006  .  DEPRESSION 09/23/2006  . CARPAL TUNNEL SYNDROME, BILATERAL 09/23/2006  . HYPERTENSION 09/23/2006  . GERD 05/24/2008  . DIVERTICULOSIS, COLON 09/23/2006  . IBS 09/23/2006  . BUNION, RIGHT FOOT 09/18/2009  . OSTEOPOROSIS 05/24/2008  . Abdominal pain, epigastric 09/18/2009  . Impaired glucose tolerance 11/04/2010  . Anosmia 11/06/2010  . Parkinson's disease ([place])   . Restrictive lung disease 12/20/2013  . Hemorrhoids     Past Surgical History  Procedure Laterality Date  . Tubal ligation    . Nasal septum repair    . S/p ganglion cyst right wrist    . S/p bilat cts    . S/p right thumb trigger finger surgury    . S/p fibroid ablation    . Esophagogastroduodenoscopy  2008    Dr. Harriette Bouillon duodenal ulcer  . Colonoscopy w/ biopsies  2008    Dr. Collene Mares -negative random bxs  . Cataract extraction    . Shoulder surgery Right 2015  . Foot surgery Bilateral 2015  . Cardiac catheterization    . Left and right heart catheterization with coronary angiogram N/A 11/09/2013    Procedure: LEFT AND RIGHT HEART CATHETERIZATION WITH CORONARY ANGIOGRAM;  Surgeon: Peter M Martinique, MD;  Location[facility] CATH LAB;  Service: Cardiovascular;  Laterality: N/A;    Social History [name]  reports that she has never smoked. She has never used smokeless tobacco. She reports that she drinks alcohol. She reports that she does not use illicit drugs.  family history includes Heart attack in her brother and mother; Heart disease (age of onset: 36) in her father; Hypertension in her brother, brother, brother, and father; Peripheral vascular disease in her mother.  Allergies  Allergen Reactions  . Lipitor [Atorvastatin] Other (See Comments)    myalgia  .  Zocor [Simvastatin] Other (See Comments)    myalgias       PHYSICAL EXAMINATION: Vital signs: BP 140/86 mmHg  Pulse 88  Ht 4' 10" (1.473 m)  Wt 143 lb (64.864 kg)  BMI 29.89 kg/m2  Constitutional: generally well-appearing, no acute distress Psychiatric:  alert and oriented x3, cooperative Eyes: extraocular movements intact, anicteric, conjunctiva pink Mouth: oral pharynx moist, no lesions Neck: supple mainly Lymph: no lymphadenopathy Cardiovascular: heart regular rate and rhythm, no murmur Lungs: clear to auscultation bilaterally Abdomen: soft, obese, nontender, nondistended, no obvious ascites, no peritoneal signs, normal bowel sounds, no organomegaly Rectal: Ommitted Extremities: no clubbing cyanosis or lower extremity edema bilaterally Skin: no lesions on visible extremities Neuro: No focal deficits. Brisk DTRs. No asterixis.  ASSESSMENT:  #1. Irritable bowel syndrome. Alternating. Previously diarrhea predominant. Now has component of constipation likely related to Parkinson medication #2. Intermittent symptoms from hemorrhoids. Request treatment #3. Last colonoscopy and upper endoscopy 2008 as described   PLAN:  #1. EXTENSIVE discussion on IBS #2. Literature on IBS provided #3. Initiate Citrucel 1-2 tablespoons daily #4. Bentyl 20 mg by mouth every 6 hours when necessary pain. Prescribed. Side effects discussed #5. Prescribe Anusol HC suppositories for hemorrhoids #6. Office follow-up 3 months  60 minutes was spent face-to-face with the patient. Greater than 50% of time was used for counseling regarding IBS and her myriad of GI complaints. Multiple questions answered to her satisfaction

## 2015-04-11 NOTE — Patient Instructions (Signed)
We have sent the following medications to your pharmacy for you to pick up at your convenience: Bentyl, Anusol HC suppositories  Begin using Citrucel daily - 1-2 tablespoons in juice or water.  Please follow up in 3 months

## 2015-04-29 ENCOUNTER — Other Ambulatory Visit: Payer: Self-pay | Admitting: Neurology

## 2015-04-29 ENCOUNTER — Other Ambulatory Visit: Payer: Self-pay | Admitting: Internal Medicine

## 2015-05-01 NOTE — Telephone Encounter (Signed)
Carbidopa Levodopa refill requested. Per last office note- patient to remain on medication. Refill approved and sent to patient's pharmacy.   

## 2015-05-04 ENCOUNTER — Other Ambulatory Visit: Payer: Self-pay | Admitting: Neurology

## 2015-05-04 ENCOUNTER — Other Ambulatory Visit: Payer: Self-pay | Admitting: Internal Medicine

## 2015-05-04 NOTE — Telephone Encounter (Signed)
Requip refill requested. Per last office note- patient to remain on medication. Refill approved and sent to patient's pharmacy.   

## 2015-07-11 ENCOUNTER — Ambulatory Visit: Payer: Medicare Other | Admitting: Neurology

## 2015-07-18 ENCOUNTER — Ambulatory Visit (INDEPENDENT_AMBULATORY_CARE_PROVIDER_SITE_OTHER): Payer: Medicare Other | Admitting: Neurology

## 2015-07-18 ENCOUNTER — Encounter: Payer: Self-pay | Admitting: Neurology

## 2015-07-18 ENCOUNTER — Telehealth: Payer: Self-pay | Admitting: Neurology

## 2015-07-18 VITALS — BP 150/90 | HR 106 | Ht <= 58 in | Wt 142.0 lb

## 2015-07-18 DIAGNOSIS — F330 Major depressive disorder, recurrent, mild: Secondary | ICD-10-CM | POA: Diagnosis not present

## 2015-07-18 DIAGNOSIS — G20 Parkinson's disease: Secondary | ICD-10-CM | POA: Diagnosis not present

## 2015-07-18 DIAGNOSIS — G4700 Insomnia, unspecified: Secondary | ICD-10-CM

## 2015-07-18 NOTE — Progress Notes (Signed)
Subjective:   Destiny Roth was seen in f/u for PD diagnosed 10/2011.  The patient is a 65 y.o. right handed female with a history of tremor.Onset of symptoms was at end of 2012.   She has noted a gradual loss of smell and taste over the last 20 years.  She even had a rhinoplasty and septoplasty in 1993 to see if that would help and it did not.  She can smell almost nothing now.    She is on Mirapex ER 1.5 mg daily.  The patient states that the tremor is virtually gone, with the exception of when she is stressed at work and then she will notice tremor in both legs.  Her walking is much better.  She feels more stable.  .  She does have some dizziness in the early morning and is unsure if that is related to Mirapex, her blood pressure medication or potassium.  No compulsive behaviors have been noted with the Mirapex.  There have been no sleep attacks.  She has not yet started exercising.  She is going to be changing jobs which is stressful for her.  10/12/12 update:  She has had some tremor internally and in the legs.  She notices some word finding trouble.   She has been under increased stress.  Her husband had back surgery 2 weeks ago and she has been the primary caregiver.  In addition, she is between jobs and needs to find a new job.  She would like to see if she can get off her blood pressure medication.  02/08/13 update:  Last visit, I increased the patient's Mirapex to a total of 2.25 mg daily.  The patient states that it definitely helped the tremor.  She almost never sees tremor any more.  She is, however, complaining about weight gain.  Her weight has been stable for many months, but when we looked back she gained weight between her May, 2014 visit and her September, 2014 visit.  She is frustrated that she cannot get that off.  In addition, she complains of some low energy and vaginal dryness.  I did look at her prior medical records since last visit.  She was complaining to her primary care  physician about dizziness and her blood pressure medication was decreased.  She did see Dr. Elsworth Soho since last visit and a split-night sleep study was recommended.  She states that she is scheduled to have foot surgery for a bunion on February 17 2013.  She had a procedure this morning to "break up a calcification" in the right shoulder.  06/07/13 update:  Pt is f/u today.  The records that were made available to me were reviewed since last visit.  Pt had surgery on both feet on separate dates for bunyionectomies.  Had tachycardia after the first and ended up having a holter monitor but HR was normal on the monitor.  Diltiazem was decreased, pindolol was added.  She had a PSG done since last visit and had an AHI of 12.   No CPAP has been recommended at this point.  She also had a R shoulder surgery on April 7 but those records are not available to me.  She does c/o if bending over, she will have lightheadedness but separate from that she has had 2 episodes of vertigo (room spinning).  Only tremor in the R hand if she has to hold out the arm that she has surgery on and then she experiences pain, so she is  not sure if the tremor is related to the pain or the surgery.  She is planning to go to [place] in about 5 weeks and is worried that she will not feel better by then.  Overall, since the arm surgery and feet surgery, she just does not feel back to herself.  She has not been able to exercise.  10/08/13 update:  The patient is following up today regarding her Parkinson's disease.  Last visit, I increased her Mirapex to 3.0 mg daily.  She is also on Cymbalta.  She has been having more fatigue and shortness of breath.  She just had a nuclear stress test done that was normal.  Her left ventricular ejection fraction was 77%.  She denies falls.  No syncope.  She is not sleepy, just fatigued during the day.  Saw Dr. Harrington Challenger today and increasing diltiazem and metoprolol is being decreased.  02/09/14 update:  The records that were  made available to me were reviewed since last visit.  Pt is accompanied by her husband who supplements the history today (never been present before).  Pt is supposed to be on mirapex 1.0 mg tid but has backed down to bid dosing.  I had to change this from the 3.0 mg ER version last visit because of insurance.  She has been to several other providers since last visit and I have discussed cases with several providers.  She has had an extensive cardiopulmonary workup for fatigue and shortness of breath.  She had an echocardiogram, stress Myoview and cardiac catheterization that were all unremarkable.  She does have mild to moderate obstructive sleep apnea syndrome, but refuses CPAP.  She saw Dr. Coralyn Mark for the sensation of being hot.  She has had an extensive workup for this as well.  No etiology has been determined.  She did go off of the Cymbalta to see if that would be of benefit.  She has been off of it since end of November and that hasn't changed.  No sweating at all.  Just feels like she has a "hot flash" without sweating.  Not sure that depression got worse going off of it but not sure if deals with things as well but also thinks that had SE with the cymbalta.  Pt does think that the mirapex may be causing some of the above issues.  She has been having LE swelling and hand edema as well.  She states that she has a "brain fog" that she thinks is due to the med.  She has a diffuse weakness and an inability to exercise due to SOB.  Again, she wonders if it is all due to mirapex.    03/04/14 update:  I held the patients mirapex for a few days and the patient felt that hot flashes and edema were better but not surprisingly, the PD symptoms got much worse.   She was glad that she did get the opportunity to see how she was off of medication but admits that it scared her to see how bad things got.   Decided to start requip and she is on 2 mg tid.  She still has some trouble with fine motor movements.  The records  that were made available to me were reviewed.  She saw Dr. Jenny Reichmann and since she still needed medication for depression after she d/c the cymbalta, she was put on lexapro.  She has only been on that since 02/15/14.  That is helping.  She is in pulmonary rehab and  that is helping greatly.  She has also d/c her antihypertensives except her HCTZ as she still has some peripheral edema.    05/13/14 update:  The patient is following up today a little bit sooner than expected.  Last visit, she was on Requip, 2 mg 3 times a day and she did not want to increase the dosage, even though she was slightly underdosed.  She did call me on April 7 complaining of increased rigidity and we decided to increase her dosage to 4 mg in the morning, 2 mg in the afternoon and 2 mg in the evening.  She called me just a few days ago to tell me that she still did not feel well and that is why she is following up.  Pt states that the increase may have helped somewhat but not fully.  She states that "I want to know that I am on the best medication for me."  Every few days, "I feel like I am going into a crisis."  She will have difficulty with washing her hair, tremor, manipulating her hands.  She has other issues that she is unsure are unrelated to PD.  She has "IBS" with diarrhea up to 15 times a day.  She has pain on the bottom of the feet.  She had a formal easter dinner at her house and spent all of the previous Friday (13 hours) preparing as she knew it would take that long.  She c/o swelling and weight gain  07/04/14 update:  The patient is following up today.  She is on ropinirole 2 mg and takes 2 tablets in the morning, one in the afternoon and 1 in evening.  Last visit, I started her on carbidopa/levodopa 25/100, one tablet 3 times per day.  Today, the patient states that she is markedly better.  She no longer has the internal shaking and overall just feels better.  She is still frustrated about her weight and having trouble losing it.     She denies any lightheadedness, syncope, hallucinations, visual distortions, falls.  She still has some foot pain but is back to wearing her heels.  She does state that she tires easily.  Pulm rehab is complete and she states that it made a huge difference.  She feels that her breathing is much better.    11/03/14 update:  The patient is following up today regarding her Parkinson's disease.  She is on Requip, 4 mg in the morning, 2 mg in the afternoon and 2 mg in the evening.  She is on carbidopa/levodopa 25/100, one tablet 3 times per day.  I reviewed records since our last visit.  Her primary care physician tapered her off of her Lexapro in June, as requested by the patient.  She states that when she was off of it she had anxiety sx's that were somatic and went back on lexapro 10 mg and cut it in 1/2.  She states that "it has been a good summer."  She is going to [place] in December and camping next week.  She has had some alternating constipation/diarrhea but diarrhea related to eating things like fruit.  No falls.  03/07/15 update:   The patient is following up today regarding her Parkinson's disease.  She is on Requip, 4 mg in the morning, 2 mg in the afternoon and 2 mg in the evening.  She is on carbidopa/levodopa 25/100, one tablet 3 times per day.  She did try to move the end of the day  dose closer to dinner and away from bed and felt terrible in the AM, so she is taking it in the AM.  However, she has to spread it out to do this.   Feels that she is a little more tremulous on the inside.  Feels that having some trouble sleeping. Asks about her URI sx's.  Hydrocodone didn't help and didn't help her sleep.  Cough is biggest problem.   Having GI issues (constipation, diarrhea, bleeding) and has upcoming appt with Dr. Henrene Pastor.   Still on Lexapro but states that battling "mild depression."   Associates that with "tired of being sick" with her URI and no longer working.  Is more physical and is walking for  exercise.     07/18/15 update:  The patient is following up today regarding her Parkinson's disease.  She is on Requip, 4 mg in the morning, 2 mg in the afternoon and 2 mg in the evening.  She is on carbidopa/levodopa 25/100, one tablet 3 times per day and last visit I added carbidopa/levodopa 25/100 CR at bedtime.  States that "the medications are not doing what they used to."  Reports that last feb she got prednisone for URI and "I felt wonderful" but felt back at baseline once off of it.  Similar experience when she went to [place] recently and her family member gave her prednisone and she felt great.  She is still on 10 mg daily and brings in a list of internet blogs of patients who experience similar things.  States that her speech improved.    I also added melatonin, 3 mg nightly for her insomnia.  She remains on Lexapro for anxiety and thinks that has gotten worse as she lowers the prednisone.  Has internal tremor.    I reviewed records since our last visit.  She has seen gastroenterology in regards to her long-standing irritable bowel.   Current/Previously tried tremor medications: Klonopin  Current medications that may exacerbate tremor:  ?Cymbalta.  I reviewed the MRI of the brain done at the end of 2013 with her.  This revealed some mild small vessel disease, likely secondary to hypertension.  Otherwise, it was unremarkable.  The small vessel disease was not within the basal ganglia.  I showed her the films in the office today.   Allergies  Allergen Reactions  . Lipitor [Atorvastatin] Other (See Comments)    myalgia  . Zocor [Simvastatin] Other (See Comments)    myalgias    Current Outpatient Prescriptions on File Prior to Visit  Medication Sig Dispense Refill  . aspirin 81 MG tablet Take 81 mg by mouth daily.    . Calcium Carbonate-Vit D-Min (CALCIUM 1200 PO) Take by mouth daily.    . carbidopa-levodopa (SINEMET IR) 25-100 MG tablet TAKE ONE TABLET BY MOUTH THREE TIMES DAILY  270 tablet 1  . Carbidopa-Levodopa ER (SINEMET CR) 25-100 MG tablet controlled release Take 1 tablet by mouth at bedtime. 90 tablet 3  . cholecalciferol (VITAMIN D) 1000 units tablet Take 1,000 Units by mouth daily.    Marland Kitchen dicyclomine (BENTYL) 20 MG tablet Take 1 tablet (20 mg total) by mouth every 6 (six) hours as needed for spasms. 30 tablet 4  . escitalopram (LEXAPRO) 10 MG tablet Take 10 mg by mouth daily.     Marland Kitchen escitalopram (LEXAPRO) 10 MG tablet TAKE ONE TABLET BY MOUTH ONCE DAILY 90 tablet 0  . hydrochlorothiazide (HYDRODIURIL) 25 MG tablet TAKE ONE TABLET BY MOUTH ONCE DAILY 90 tablet 0  .  hydrocortisone (ANUSOL-HC) 25 MG suppository Place 1 suppository (25 mg total) rectally at bedtime as needed for hemorrhoids or itching. 14 suppository 2  . metoprolol tartrate (LOPRESSOR) 25 MG tablet Take 0.5 tablets (12.5 mg total) by mouth 2 (two) times daily as needed. (Patient taking differently: Take 12.5 mg by mouth 2 (two) times daily as needed. Takes when HR>100) 180 tablet 3  . Multiple Vitamins-Calcium (ONE-A-DAY WOMENS PO) Take 1 tablet by mouth daily.    Marland Kitchen rOPINIRole (REQUIP) 2 MG tablet TAKE TWO TABLETS BY MOUTH IN THE MORNING, TAKE  ONE  TABLET  IN  THE  AFTERNOON,  AND  TAKE  ONE  TABLET  IN  THE  EVENING 360 tablet 1   No current facility-administered medications on file prior to visit.    Past Medical History  Diagnosis Date  . GLUCOSE INTOLERANCE 05/24/2008  . HYPERLIPIDEMIA 09/23/2006  . ANXIETY 09/23/2006  . DEPRESSION 09/23/2006  . CARPAL TUNNEL SYNDROME, BILATERAL 09/23/2006  . HYPERTENSION 09/23/2006  . GERD 05/24/2008  . DIVERTICULOSIS, COLON 09/23/2006  . IBS 09/23/2006  . BUNION, RIGHT FOOT 09/18/2009  . OSTEOPOROSIS 05/24/2008  . Abdominal pain, epigastric 09/18/2009  . Impaired glucose tolerance 11/04/2010  . Anosmia 11/06/2010  . Parkinson's disease ([place])   . Restrictive lung disease 12/20/2013  . Hemorrhoids     Past Surgical History  Procedure Laterality Date  . Tubal  ligation    . Nasal septum repair    . S/p ganglion cyst right wrist    . S/p bilat cts    . S/p right thumb trigger finger surgury    . S/p fibroid ablation    . Esophagogastroduodenoscopy  May 21, 2006    Dr. Harriette Bouillon duodenal ulcer  . Colonoscopy w/ biopsies  05/21/2006    Dr. Collene Mares -negative random bxs  . Cataract extraction    . Shoulder surgery Right May 20, 2013  . Foot surgery Bilateral May 20, 2013  . Cardiac catheterization    . Left and right heart catheterization with coronary angiogram N/A 11/09/2013    Procedure: LEFT AND RIGHT HEART CATHETERIZATION WITH CORONARY ANGIOGRAM;  Surgeon: Peter M Martinique, MD;  Location: [facility] CATH LAB;  Service: Cardiovascular;  Laterality: N/A;    Social History   Social History  . Marital Status: Married    Spouse Name: N/A  . Number of Children: 2  . Years of Education: N/A   Occupational History  . Not on file.   Social History Main Topics  . Smoking status: Never Smoker   . Smokeless tobacco: Never Used  . Alcohol Use: Yes     Comment: Occ  . Drug Use: No  . Sexual Activity:    Partners: Male    Birth Control/ Protection: Post-menopausal   Other Topics Concern  . Not on file   Social History Narrative    Family Status  Relation Status Death Age  . Mother Deceased     05-20-01, PVD, [place], [place]  . Father Deceased 33    MI, hypertension since teens  . Child Alive     2, alive and well  . Maternal Grandmother Deceased     PD  . Brother Alive   . Brother Alive   . Brother Alive     Review of Systems A complete 10 system ROS was obtained and was negative apart from what is mentioned.   Objective:   VITALS:   Filed Vitals:   07/18/15 1049  BP: 150/90  Pulse: 106  Height: 4' 9.5" (1.461 m)  Weight: 142 lb (64.411 kg)  SpO2: 94%   Wt Readings from Last 3 Encounters:  07/18/15 142 lb (64.411 kg)  04/11/15 143 lb (64.864 kg)  03/07/15 142 lb (64.411 kg)    GEN:  The patient appears stated age (actually younger) and is in NAD. HEENT:   Normocephalic, atraumatic.  The mucous membranes are moist. The superficial temporal arteries are without ropiness or tenderness. CV:  RRR Lungs:  CTAB Neck/HEME:  There are no carotid bruits bilaterally. Exts:  No edema noted  Neurological examination:  Orientation: The patient is alert and oriented x3. Fund of knowledge is appropriate.  Recent and remote memory are intact.  Attention and concentration are normal.    Able to name objects and repeat phrases. Cranial nerves: There is good facial symmetry.   Extraocular muscles are intact.  There are no square wave jerks. The speech is fluent and clear. Soft palate rises symmetrically and there is no tongue deviation. Hearing is intact to conversational tone. Sensation: Sensation is intact to light to light touch throughout. Motor: Strength is 5/5 in the bilateral upper and lower extremities.   Shoulder shrug is equal and symmetric.  There is no pronator drift.   Movement examination: Tone: There is mild increased tone in the UE's bilaterally (almost due for another dose) Abnormal movements: There was no tremor today even with distraction procedures. Coordination:  There is just slight decreased finger taps on the L and decreased hand opening and closing Gait and Station:    Her gait had a fairly normal stride length today.  Arm swing was good.  She is stable today.  She has a negative pull test  LABS:  Lab Results  Component Value Date   WBC 7.7 07/07/2014   HGB 15.1* 07/07/2014   HCT 44.4 07/07/2014   MCV 87.9 07/07/2014   PLT 337.0 07/07/2014     Chemistry      Component Value Date/Time   NA 137 07/07/2014 1225   K 3.7 07/07/2014 1225   CL 99 07/07/2014 1225   CO2 31 07/07/2014 1225   BUN 21 07/07/2014 1225   CREATININE 0.91 07/07/2014 1225      Component Value Date/Time   CALCIUM 10.1 07/07/2014 1225   ALKPHOS 76 07/07/2014 1225   AST 28 07/07/2014 1225   ALT 8 07/07/2014 1225   BILITOT 0.5 07/07/2014 1225     Lab  Results  Component Value Date   VITAMINB12 443 11/26/2011   Lab Results  Component Value Date   FOLATE 13.5 11/26/2011   Lab Results  Component Value Date   TSH 1.14 07/07/2014        Assessment/Plan:   1.  Akinetic rigid Parkinson's disease.  -continue requip 2mg : 4mg /2mg /2mg  for a total of 8 mg per day.    -continue carbidopa/levodopa 25/100, one tablet tid (started levodopa 04/2014).  She was taking the last dose at bedtime and I asked her to move that dosage closer to dinner again.  I think that she gets tremulous because she spreads them too far apart as when she tried to move it away from bedtime she felt bad in the AM.  Risks, benefits, side effects and alternative therapies were discussed.  The opportunity to ask questions was given and they were answered to the best of my ability.  The patient expressed understanding and willingness to follow the outlined treatment protocols.  -continue carbidopa/levodopa 25/100 CR at bedtime. This is low dose but trying to limit her levodopa load.    -  long discussion with patient that prednisone is not indicated for Parkinsons disease and that many people report that they "feel good" on prednisone.  Explained that I was not willing to prescribe long term prednisone.  States that she will find a physician who will prescribe it.  Asks me for a referral to [facility].  Will refer. 2.  Insomnia  -tried melatonin, 3 mg nightly and helped but had hangover effect but it helped insomnia.  Told her to try earlier in the evening.  .   3.  Anxiety with somatic sx'x  -back on Lexapro.  She attempted to discontinue it, but had somatic symptoms return that she associated with anxiety 4.  Constipation  -copy of rancho recipe given today 5. Much greater than 50% of this visit was spent in counseling with the patient and the family.  Total face to face time:  35 min

## 2015-07-18 NOTE — Patient Instructions (Signed)
We will make referral to [facility] for a new patient neurology appt. They will call you directly to schedule. If you do not hear from them please call [phone]. You are welcome to follow up here as needed.

## 2015-07-18 NOTE — Addendum Note (Signed)
Addended byAnnamaria Helling on: 07/18/2015 11:23 AM   Modules accepted: Orders

## 2015-07-18 NOTE — Telephone Encounter (Signed)
Referral faxed to [facility] at [phone] with confirmation received. They will call patient directly with appt.

## 2015-07-19 DIAGNOSIS — L821 Other seborrheic keratosis: Secondary | ICD-10-CM | POA: Diagnosis not present

## 2015-07-19 DIAGNOSIS — L820 Inflamed seborrheic keratosis: Secondary | ICD-10-CM | POA: Diagnosis not present

## 2015-07-20 ENCOUNTER — Other Ambulatory Visit: Payer: Self-pay | Admitting: Internal Medicine

## 2015-07-20 NOTE — Telephone Encounter (Signed)
Please advise 

## 2015-07-20 NOTE — Telephone Encounter (Signed)
Done erx 

## 2015-08-08 ENCOUNTER — Other Ambulatory Visit: Payer: Self-pay | Admitting: Internal Medicine

## 2015-08-08 ENCOUNTER — Ambulatory Visit: Payer: Medicare Other | Admitting: Internal Medicine

## 2015-08-27 ENCOUNTER — Other Ambulatory Visit: Payer: Self-pay | Admitting: Internal Medicine

## 2015-10-20 ENCOUNTER — Other Ambulatory Visit: Payer: Self-pay | Admitting: Internal Medicine

## 2015-10-21 ENCOUNTER — Other Ambulatory Visit: Payer: Self-pay | Admitting: Neurology

## 2015-10-25 ENCOUNTER — Other Ambulatory Visit: Payer: Self-pay | Admitting: Neurology

## 2015-12-02 DIAGNOSIS — Z23 Encounter for immunization: Secondary | ICD-10-CM | POA: Diagnosis not present

## 2015-12-04 ENCOUNTER — Ambulatory Visit: Payer: Medicare Other | Admitting: Neurology

## 2016-01-15 DIAGNOSIS — R5383 Other fatigue: Secondary | ICD-10-CM | POA: Diagnosis not present

## 2016-01-15 DIAGNOSIS — F329 Major depressive disorder, single episode, unspecified: Secondary | ICD-10-CM | POA: Diagnosis not present

## 2016-01-15 DIAGNOSIS — R4189 Other symptoms and signs involving cognitive functions and awareness: Secondary | ICD-10-CM | POA: Diagnosis not present

## 2016-01-15 DIAGNOSIS — G20 Parkinson's disease: Secondary | ICD-10-CM | POA: Diagnosis not present

## 2016-01-15 DIAGNOSIS — G4733 Obstructive sleep apnea (adult) (pediatric): Secondary | ICD-10-CM | POA: Diagnosis not present

## 2016-02-05 ENCOUNTER — Telehealth: Payer: Self-pay | Admitting: Neurology

## 2016-02-05 ENCOUNTER — Telehealth: Payer: Self-pay | Admitting: Internal Medicine

## 2016-02-05 ENCOUNTER — Other Ambulatory Visit: Payer: Self-pay | Admitting: *Deleted

## 2016-02-05 ENCOUNTER — Encounter: Payer: Self-pay | Admitting: Neurology

## 2016-02-05 MED ORDER — POTASSIUM CHLORIDE ER 10 MEQ PO TBCR: 10.0000 meq | EXTENDED_RELEASE_TABLET | Freq: Every day | ORAL | 0 refills | Status: DC

## 2016-02-05 MED ORDER — HYDROCHLOROTHIAZIDE 25 MG PO TABS: 25.0000 mg | ORAL_TABLET | Freq: Every day | ORAL | 0 refills | Status: DC

## 2016-02-05 MED ORDER — ESCITALOPRAM OXALATE 20 MG PO TABS: 20.0000 mg | ORAL_TABLET | Freq: Every day | ORAL | 3 refills | Status: DC

## 2016-02-05 NOTE — Telephone Encounter (Signed)
Ok for lexapro increase, I have sent erx

## 2016-02-05 NOTE — Telephone Encounter (Signed)
PT called and said she needs her prescriptions sent to [facility] now/Dawn CB# [phone]

## 2016-02-05 NOTE — Telephone Encounter (Signed)
Left message on machine for patient making her aware to contact Dr. Alberteen Sam office for refills.

## 2016-02-05 NOTE — Telephone Encounter (Signed)
I have reviewed Dr. Percell Belt records and it states that the patient requested to follow up at [facility].  Therefore, she will need to obtain her medication from [facility].

## 2016-02-05 NOTE — Telephone Encounter (Signed)
Patient saw Dr. Anna Genre at [facility] for second opinion who increased dose of Levodopa. She has a follow up appt with him in February. Has not seen you in over 6 months. Please advise.

## 2016-02-05 NOTE — Telephone Encounter (Signed)
Rec'd call pt states due to change in insurance will have to get rx's from [facility]. Pt is needing her HCTZ, Lexapro, and Klor con. Pt states her MD is [facility] want her to increase the Lexapro to 20 mg if ok w/Dr. Jenny Reichmann.  Inform pt will send maintenance meds to mail service. Pls advise on increase Lexapro...Destiny Roth

## 2016-02-06 NOTE — Telephone Encounter (Signed)
Pt has been made aware [facility].Marland KitchenJohny Roth

## 2016-02-07 NOTE — Telephone Encounter (Signed)
Noted. Pharmacy changed

## 2016-02-13 ENCOUNTER — Telehealth: Payer: Self-pay | Admitting: Internal Medicine

## 2016-02-19 NOTE — Telephone Encounter (Signed)
Left message that I would be happy to send a short term supply of the meds she requested but that she needed to schedule an office visit - was seen last march and told to follow up in 3 months which did not happen.

## 2016-02-19 NOTE — Telephone Encounter (Signed)
Pt is calling back about her meds

## 2016-02-20 ENCOUNTER — Telehealth: Payer: Self-pay | Admitting: Internal Medicine

## 2016-02-20 MED ORDER — DICYCLOMINE HCL 20 MG PO TABS: 20.0000 mg | ORAL_TABLET | Freq: Four times a day (QID) | ORAL | 1 refills | Status: DC | PRN

## 2016-02-20 MED ORDER — HYDROCORTISONE ACETATE 25 MG RE SUPP: 25.0000 mg | Freq: Every evening | RECTAL | 1 refills | Status: DC | PRN

## 2016-02-20 NOTE — Telephone Encounter (Signed)
Sent 90 day supply of Bentyl and Hydrocortisone to [facility]

## 2016-02-21 MED ORDER — HYDROCORTISONE ACETATE 25 MG RE SUPP: 25.0000 mg | Freq: Every evening | RECTAL | 1 refills | Status: DC | PRN

## 2016-02-21 NOTE — Telephone Encounter (Signed)
Anusol sent to [facility] [address]

## 2016-03-12 ENCOUNTER — Encounter: Payer: Self-pay | Admitting: Pulmonary Disease

## 2016-03-12 ENCOUNTER — Ambulatory Visit (INDEPENDENT_AMBULATORY_CARE_PROVIDER_SITE_OTHER): Payer: Medicare Other | Admitting: Family

## 2016-03-12 ENCOUNTER — Ambulatory Visit (INDEPENDENT_AMBULATORY_CARE_PROVIDER_SITE_OTHER): Payer: Medicare Other | Admitting: Pulmonary Disease

## 2016-03-12 ENCOUNTER — Encounter: Payer: Self-pay | Admitting: Family

## 2016-03-12 VITALS — BP 124/88 | HR 94 | Ht <= 58 in | Wt 147.0 lb

## 2016-03-12 DIAGNOSIS — J984 Other disorders of lung: Secondary | ICD-10-CM

## 2016-03-12 DIAGNOSIS — R42 Dizziness and giddiness: Secondary | ICD-10-CM | POA: Diagnosis not present

## 2016-03-12 DIAGNOSIS — G4733 Obstructive sleep apnea (adult) (pediatric): Secondary | ICD-10-CM | POA: Diagnosis not present

## 2016-03-12 MED ORDER — MECLIZINE HCL 25 MG PO TABS: 25.0000 mg | ORAL_TABLET | Freq: Four times a day (QID) | ORAL | 0 refills | Status: DC | PRN

## 2016-03-12 NOTE — Assessment & Plan Note (Signed)
CPAP titration study Based on this we will set you up with a CPAP machine  We discussed alternatives in detail including oral appliance and no treatment at all. We decided that has proof of concept, treatment trial with CPAP would be the best way to decide how much of her symptoms are related to OSA versus effect of Parkinson's or medications

## 2016-03-12 NOTE — Progress Notes (Signed)
   Subjective:    Patient ID: Destiny Roth, female    DOB: 1950/12/29, 66 y.o.   MRN: [id number]  HPI  66 year old woman for evaluation of dyspnea and mild OSA She was diagnosed with Parkinson's 2013 and has been maintained on Mirapex . Leg jerks have improved after increasing dose of Mirapex. She has a history of  vivid dreams and screaming and talking during sleep.  03/12/2016  Chief Complaint  Patient presents with  . Follow-up    Wants to discuss CPAP machine.    Follow-up after 3 years. She has now been diagnosed with Parkinson's for 4-1/2 years, has been to [facility], Sinemet was increased and then decreased again due to side effects. Lexapro was increased but this does not seem to help. She continues to have significant fatigue throughout the day, denies daytime drowsiness, sleep habits are unchanged and wakes up feeling unrefreshed. She reports vivid dreams and has acted out her dreams and one or 2 occasions but this does not occur frequently.  She is dealing with vertigo for the past few weeks  Her dyspnea has resolved  We reviewed her sleep study in detail and discussed options Significant tests/ events   - echo nml Cath - non obstructive CAD, normal LV fn CT chest without contrast nad PFTs no airway obstruction, FVC 90%, TLC decreased at 70%, DLCO normal Labs-normal renal function, ABG does not show hypercarbia, PO2 71 on room air  PSG 2015 showed mild OSA - AHI 12/h- No evidence of increased muscle tone during REM sleep, REM AHI was 36/h. There were 58 RERAs with an RDI of 23 events per hour    Review of Systems neg for any significant sore throat, dysphagia, itching, sneezing, nasal congestion or excess/ purulent secretions, fever, chills, sweats, unintended wt loss, pleuritic or exertional cp, hempoptysis, orthopnea pnd or change in chronic leg swelling. Also denies presyncope, palpitations, heartburn, abdominal pain, nausea, vomiting, diarrhea or change in  bowel or urinary habits, dysuria,hematuria, rash, arthralgias, visual complaints, headache, numbness weakness or ataxia.     Objective:   Physical Exam  Gen. Pleasant, obese, in no distress ENT - no lesions, no post nasal drip Neck: No JVD, no thyromegaly, no carotid bruits Lungs: no use of accessory muscles, no dullness to percussion, decreased without rales or rhonchi  Cardiovascular: Rhythm regular, heart sounds  normal, no murmurs or gallops, no peripheral edema Musculoskeletal: No deformities, no cyanosis or clubbing , no tremors       Assessment & Plan:

## 2016-03-12 NOTE — Progress Notes (Signed)
Subjective:    Patient ID: Destiny Roth, female    DOB: 1950-05-26, 66 y.o.   MRN: [id number]  Chief Complaint  Patient presents with  . Dizziness    has been going on since january 3rd, got better and then worsened in the last week, had an episode on sunday where she got really dizzy and did throw up    HPI:  Destiny Roth is a 66 y.o. female who  has a past medical history of Abdominal pain, epigastric (09/18/2009); Anosmia (11/06/2010); ANXIETY (09/23/2006); BUNION, RIGHT FOOT (09/18/2009); CARPAL TUNNEL SYNDROME, BILATERAL (09/23/2006); DEPRESSION (09/23/2006); DIVERTICULOSIS, COLON (09/23/2006); GERD (05/24/2008); GLUCOSE INTOLERANCE (05/24/2008); Hemorrhoids; HYPERLIPIDEMIA (09/23/2006); HYPERTENSION (09/23/2006); IBS (09/23/2006); Impaired glucose tolerance (11/04/2010); OSTEOPOROSIS (05/24/2008); Parkinson's disease ([place]); and Restrictive lung disease (12/20/2013). and presents today for an acute office visit.  Vertigo - This is a new problem. Associated symptom of dizziness has been going on for about 2-3 weeks and has been refractory to the modifying factors of meclazine. She does have some ringing in her ears. Dizziness is described as the room spinning. Has had some nausea and 2 episodes of vomiting. Symptoms are generally stable currently but continue to stay the same. Has had some vertigo from her Parkinson's medications but notes this feels different. No fevers or congestion. Has drank about 8 glasses of water per day day with minimal caffeine and no alcohol.    Allergies  Allergen Reactions  . Lipitor [Atorvastatin] Other (See Comments)    myalgia  . Zocor [Simvastatin] Other (See Comments)    myalgias      Outpatient Medications Prior to Visit  Medication Sig Dispense Refill  . aspirin 81 MG tablet Take 81 mg by mouth daily.    . Calcium Carbonate-Vit D-Min (CALCIUM 1200 PO) Take by mouth daily.    . carbidopa-levodopa (SINEMET IR) 25-100 MG tablet TAKE ONE TABLET BY  MOUTH THREE TIMES DAILY 270 tablet 1  . Carbidopa-Levodopa ER (SINEMET CR) 25-100 MG tablet controlled release Take 1 tablet by mouth at bedtime. 90 tablet 3  . cholecalciferol (VITAMIN D) 1000 units tablet Take 1,000 Units by mouth daily.    Marland Kitchen dicyclomine (BENTYL) 20 MG tablet Take 1 tablet (20 mg total) by mouth every 6 (six) hours as needed for spasms. 90 tablet 1  . escitalopram (LEXAPRO) 20 MG tablet Take 1 tablet (20 mg total) by mouth daily. 90 tablet 3  . hydrochlorothiazide (HYDRODIURIL) 25 MG tablet Take 1 tablet (25 mg total) by mouth daily. Yearly physical is due must see MD for future refills 90 tablet 0  . hydrocortisone (ANUSOL-HC) 25 MG suppository Place 1 suppository (25 mg total) rectally at bedtime as needed for hemorrhoids or itching. 100 suppository 1  . Multiple Vitamins-Calcium (ONE-A-DAY WOMENS PO) Take 1 tablet by mouth daily.    . potassium chloride (K-DUR) 10 MEQ tablet Take 1 tablet (10 mEq total) by mouth daily. Yearly physical is due in must see MD for future refills 90 tablet 0  . rOPINIRole (REQUIP) 2 MG tablet TAKE TWO TABLETS BY MOUTH IN THE MORNING TAKE  ONE  TABLET  IN  THE  AFTERNOON  AND  TAKE  ONE  TABLET  IN  THE  EVENING 360 tablet 1  . metoprolol tartrate (LOPRESSOR) 25 MG tablet Take 0.5 tablets (12.5 mg total) by mouth 2 (two) times daily as needed. (Patient taking differently: Take 12.5 mg by mouth 2 (two) times daily as needed. Takes when HR>100) 180 tablet 3  No facility-administered medications prior to visit.       Past Surgical History:  Procedure Laterality Date  . CARDIAC CATHETERIZATION    . CATARACT EXTRACTION    . COLONOSCOPY W/ BIOPSIES  2008   Dr. Collene Mares -negative random bxs  . ESOPHAGOGASTRODUODENOSCOPY  2008   Dr. Harriette Bouillon duodenal ulcer  . FOOT SURGERY Bilateral 2015  . LEFT AND RIGHT HEART CATHETERIZATION WITH CORONARY ANGIOGRAM N/A 11/09/2013   Procedure: LEFT AND RIGHT HEART CATHETERIZATION WITH CORONARY ANGIOGRAM;  Surgeon:  Peter M Martinique, MD;  Location: [facility] CATH LAB;  Service: Cardiovascular;  Laterality: N/A;  . nasal septum repair    . s/p bilat CTS    . s/p fibroid ablation    . s/p ganglion cyst right wrist    . s/p right thumb trigger finger surgury    . SHOULDER SURGERY Right 2015  . TUBAL LIGATION        Past Medical History:  Diagnosis Date  . Abdominal pain, epigastric 09/18/2009  . Anosmia 11/06/2010  . ANXIETY 09/23/2006  . BUNION, RIGHT FOOT 09/18/2009  . CARPAL TUNNEL SYNDROME, BILATERAL 09/23/2006  . DEPRESSION 09/23/2006  . DIVERTICULOSIS, COLON 09/23/2006  . GERD 05/24/2008  . GLUCOSE INTOLERANCE 05/24/2008  . Hemorrhoids   . HYPERLIPIDEMIA 09/23/2006  . HYPERTENSION 09/23/2006  . IBS 09/23/2006  . Impaired glucose tolerance 11/04/2010  . OSTEOPOROSIS 05/24/2008  . Parkinson's disease ([place])   . Restrictive lung disease 12/20/2013      Review of Systems  Constitutional: Negative for chills and fever.  Respiratory: Negative for chest tightness and shortness of breath.   Gastrointestinal: Positive for nausea and vomiting.  Neurological: Positive for dizziness. Negative for seizures, syncope, weakness, light-headedness and numbness.      Objective:    BP (!) 164/94 (BP Location: Left Arm, Cuff Size: Normal)   Pulse (!) 105   Temp 98.5 F (36.9 C) (Oral)   Resp 18   Ht 4' 9.5" (1.461 m)   Wt 148 lb (67.1 kg)   SpO2 97%   BMI 31.47 kg/m  Nursing note and vital signs reviewed.  Physical Exam  Constitutional: She is oriented to person, place, and time. She appears well-developed and well-nourished. No distress.  HENT:  Right Ear: Hearing, tympanic membrane, external ear and ear canal normal.  Left Ear: Hearing, tympanic membrane, external ear and ear canal normal.  Nose: Nose normal. Right sinus exhibits no maxillary sinus tenderness and no frontal sinus tenderness. Left sinus exhibits no maxillary sinus tenderness and no frontal sinus tenderness.  Mouth/Throat: Uvula is midline,  oropharynx is clear and moist and mucous membranes are normal.  Cardiovascular: Normal rate, regular rhythm, normal heart sounds and intact distal pulses.   Pulmonary/Chest: Effort normal and breath sounds normal.  Neurological: She is alert and oriented to person, place, and time.  Positive modified Dix-Hallpike with increased symptoms greatest with left head rotation.   Skin: Skin is warm and dry.  Psychiatric: She has a normal mood and affect. Her behavior is normal. Judgment and thought content normal.       Assessment & Plan:   Problem List Items Addressed This Visit      Other   Vertigo    Symptoms and exam are consistent with vertigo.No changes in medication noted. Continue current dosage of meclizine as needed for dizziness. Refer to physical therapy. Continue fluid intake. Follow up if symptoms worsen or do not improve.       Relevant Orders   Ambulatory Referral  to Neuro Rehab       I have discontinued Ms. Elsass's metoprolol tartrate. I am also having her start on meclizine. Additionally, I am having her maintain her aspirin, Multiple Vitamins-Calcium (ONE-A-DAY WOMENS PO), cholecalciferol, Calcium Carbonate-Vit D-Min (CALCIUM 1200 PO), Carbidopa-Levodopa ER, rOPINIRole, carbidopa-levodopa, hydrochlorothiazide, potassium chloride, escitalopram, dicyclomine, and hydrocortisone.   Meds ordered this encounter  Medications  . meclizine (ANTIVERT) 25 MG tablet    Sig: Take 1 tablet (25 mg total) by mouth 4 (four) times daily as needed for dizziness.    Dispense:  60 tablet    Refill:  0    Order Specific Question:   Supervising Provider    Answer:   Pricilla Holm A [id number]     Follow-up: Return if symptoms worsen or fail to improve.  Mauricio Po, FNP

## 2016-03-12 NOTE — Assessment & Plan Note (Addendum)
Symptoms and exam are consistent with vertigo.No changes in medication noted. Continue current dosage of meclizine as needed for dizziness. Refer to physical therapy. Continue fluid intake. Follow up if symptoms worsen or do not improve.

## 2016-03-12 NOTE — Patient Instructions (Signed)
Thank you for choosing [facility].  SUMMARY AND INSTRUCTIONS:  They will call to schedule your appointment with physical therapy.  Medication:  Meclizine 1 tablet by mouth 4 x daily as needed.   Your prescription(s) have been submitted to your pharmacy or been printed and provided for you. Please take as directed and contact our office if you believe you are having problem(s) with the medication(s) or have any questions.  Referrals:  Referrals have been made during this visit. You should expect to hear back from our schedulers in about 7-10 days in regards to establishing an appointment with the specialists we discussed.   Follow up:  If your symptoms worsen or fail to improve, please contact our office for further instruction, or in case of emergency go directly to the emergency room at the closest medical facility.    Benign Positional Vertigo Introduction Vertigo is the feeling that you or your surroundings are moving when they are not. Benign positional vertigo is the most common form of vertigo. The cause of this condition is not serious (is benign). This condition is triggered by certain movements and positions (is positional). This condition can be dangerous if it occurs while you are doing something that could endanger you or others, such as driving. What are the causes? In many cases, the cause of this condition is not known. It may be caused by a disturbance in an area of the inner ear that helps your brain to sense movement and balance. This disturbance can be caused by a viral infection (labyrinthitis), head injury, or repetitive motion. What increases the risk? This condition is more likely to develop in:  Women.  People who are 31 years of age or older. What are the signs or symptoms? Symptoms of this condition usually happen when you move your head or your eyes in different directions. Symptoms may start suddenly, and they usually last for less than a minute.  Symptoms may include:  Loss of balance and falling.  Feeling like you are spinning or moving.  Feeling like your surroundings are spinning or moving.  Nausea and vomiting.  Blurred vision.  Dizziness.  Involuntary eye movement (nystagmus). Symptoms can be mild and cause only slight annoyance, or they can be severe and interfere with daily life. Episodes of benign positional vertigo may return (recur) over time, and they may be triggered by certain movements. Symptoms may improve over time. How is this diagnosed? This condition is usually diagnosed by medical history and a physical exam of the head, neck, and ears. You may be referred to a health care provider who specializes in ear, nose, and throat (ENT) problems (otolaryngologist) or a provider who specializes in disorders of the nervous system (neurologist). You may have additional testing, including:  MRI.  A CT scan.  Eye movement tests. Your health care provider may ask you to change positions quickly while he or she watches you for symptoms of benign positional vertigo, such as nystagmus. Eye movement may be tested with an electronystagmogram (ENG), caloric stimulation, the Dix-Hallpike test, or the roll test.  An electroencephalogram (EEG). This records electrical activity in your brain.  Hearing tests. How is this treated? Usually, your health care provider will treat this by moving your head in specific positions to adjust your inner ear back to normal. Surgery may be needed in severe cases, but this is rare. In some cases, benign positional vertigo may resolve on its own in 2-4 weeks. Follow these instructions at home: Safety  Move slowly.Avoid sudden body or head movements.  Avoid driving.  Avoid operating heavy machinery.  Avoid doing any tasks that would be dangerous to you or others if a vertigo episode would occur.  If you have trouble walking or keeping your balance, try using a cane for stability. If you  feel dizzy or unstable, sit down right away.  Return to your normal activities as told by your health care provider. Ask your health care provider what activities are safe for you. General instructions  Take over-the-counter and prescription medicines only as told by your health care provider.  Avoid certain positions or movements as told by your health care provider.  Drink enough fluid to keep your urine clear or pale yellow.  Keep all follow-up visits as told by your health care provider. This is important. Contact a health care provider if:  You have a fever.  Your condition gets worse or you develop new symptoms.  Your family or friends notice any behavioral changes.  Your nausea or vomiting gets worse.  You have numbness or a "pins and needles" sensation. Get help right away if:  You have difficulty speaking or moving.  You are always dizzy.  You faint.  You develop severe headaches.  You have weakness in your legs or arms.  You have changes in your hearing or vision.  You develop a stiff neck.  You develop sensitivity to light. This information is not intended to replace advice given to you by your health care provider. Make sure you discuss any questions you have with your health care provider. Document Released: 10/22/2005 Document Revised: 06/22/2015 Document Reviewed: 05/09/2014  2017 [employer]

## 2016-03-12 NOTE — Assessment & Plan Note (Signed)
Dyspnea resolved

## 2016-03-12 NOTE — Patient Instructions (Signed)
CPAP titration study Based on this we will set you up with a CPAP machine

## 2016-03-14 DIAGNOSIS — R5383 Other fatigue: Secondary | ICD-10-CM | POA: Diagnosis not present

## 2016-03-14 DIAGNOSIS — G20 Parkinson's disease: Secondary | ICD-10-CM | POA: Diagnosis not present

## 2016-03-14 DIAGNOSIS — F329 Major depressive disorder, single episode, unspecified: Secondary | ICD-10-CM | POA: Diagnosis not present

## 2016-03-14 DIAGNOSIS — G4733 Obstructive sleep apnea (adult) (pediatric): Secondary | ICD-10-CM | POA: Diagnosis not present

## 2016-03-14 DIAGNOSIS — R4189 Other symptoms and signs involving cognitive functions and awareness: Secondary | ICD-10-CM | POA: Diagnosis not present

## 2016-03-18 ENCOUNTER — Encounter: Payer: Self-pay | Admitting: Physical Therapy

## 2016-03-18 ENCOUNTER — Ambulatory Visit: Payer: Medicare Other | Attending: Family | Admitting: Physical Therapy

## 2016-03-18 DIAGNOSIS — R2681 Unsteadiness on feet: Secondary | ICD-10-CM

## 2016-03-18 DIAGNOSIS — H8112 Benign paroxysmal vertigo, left ear: Secondary | ICD-10-CM | POA: Diagnosis not present

## 2016-03-18 DIAGNOSIS — R42 Dizziness and giddiness: Secondary | ICD-10-CM | POA: Diagnosis not present

## 2016-03-18 NOTE — Therapy (Signed)
[facility] [address] [place], [place], [address] Phone: [phone]   Fax:  [phone]  Physical Therapy Evaluation  Patient Details  Name: Destiny Roth MRN: [id number] Date of Birth: July 05, 1950 Referring Provider: Golden Circle, FNP  Encounter Date: 03/18/2016      PT End of Session - 03/18/16 1343    Visit Number 1   Number of Visits 7   Date for PT Re-Evaluation 05/02/16   Authorization Type Medicare Part A and B-G code and progress note every 10th visit   PT Start Time 1148   PT Stop Time 1240   PT Time Calculation (min) 52 min   Activity Tolerance Patient tolerated treatment well   Behavior During Therapy [facility] for tasks assessed/performed      Past Medical History:  Diagnosis Date  . Abdominal pain, epigastric 09/18/2009  . Anosmia 11/06/2010  . ANXIETY 09/23/2006  . BUNION, RIGHT FOOT 09/18/2009  . CARPAL TUNNEL SYNDROME, BILATERAL 09/23/2006  . DEPRESSION 09/23/2006  . DIVERTICULOSIS, COLON 09/23/2006  . GERD 05/24/2008  . GLUCOSE INTOLERANCE 05/24/2008  . Hemorrhoids   . HYPERLIPIDEMIA 09/23/2006  . HYPERTENSION 09/23/2006  . IBS 09/23/2006  . Impaired glucose tolerance 11/04/2010  . OSTEOPOROSIS 05/24/2008  . Parkinson's disease ([place])   . Restrictive lung disease 12/20/2013    Past Surgical History:  Procedure Laterality Date  . CARDIAC CATHETERIZATION    . CATARACT EXTRACTION    . COLONOSCOPY W/ BIOPSIES  2008   Dr. Collene Mares -negative random bxs  . ESOPHAGOGASTRODUODENOSCOPY  2008   Dr. Harriette Bouillon duodenal ulcer  . FOOT SURGERY Bilateral 2015  . LEFT AND RIGHT HEART CATHETERIZATION WITH CORONARY ANGIOGRAM N/A 11/09/2013   Procedure: LEFT AND RIGHT HEART CATHETERIZATION WITH CORONARY ANGIOGRAM;  Surgeon: Peter M Martinique, MD;  Location: [facility] CATH LAB;  Service: Cardiovascular;  Laterality: N/A;  . nasal septum repair    . s/p bilat CTS    . s/p fibroid ablation    . s/p ganglion cyst right  wrist    . s/p right thumb trigger finger surgury    . SHOULDER SURGERY Right 2015  . TUBAL LIGATION      There were no vitals filed for this visit.       Subjective Assessment - 03/18/16 1156    Subjective Pt presents to OPPT for acute onset of vertigo that began January 31, 2016; has been ongoing for 2 weeks.  No history of vertigo.  Reports buzzing in bilat ears prior to episode of vertigo.  No falls but reports LOB.   Patient is accompained by: Family member   Pertinent History Parkinson's   Patient Stated Goals To get rid of the dizziness and buzzing in ears   Currently in Pain? No/denies  sensitivity at L mastoid            [facility] PT Assessment - 03/18/16 1201      Assessment   Medical Diagnosis Vertigo, imbalance   Referring Provider Golden Circle, FNP   Onset Date/Surgical Date 01/31/16   Hand Dominance Right   Next MD Visit nothing scheduled   Prior Therapy yes-hand therapy for carpal tunnel     Balance Screen   Has the patient fallen in the past 6 months No   Has the patient had a decrease in activity level because of a fear of falling?  Yes   Is the patient reluctant to leave their home because of a fear of falling?  No  Observation/Other Assessments   Focus on Therapeutic Outcomes (FOTO)  55% (45% limited, predicted 28% limited)   Dizziness Handicap Inventory [facility])  68            Vestibular Assessment - 03/18/16 1203      Vestibular Assessment   General Observation No acute distress; does have nausea and vomiting     Symptom Behavior   Type of Dizziness Spinning   Frequency of Dizziness acute, daily   Duration of Dizziness constant, acute episodes last 1 minute   Aggravating Factors Turning head quickly;Activity in general   Relieving Factors Rest     Occulomotor Exam   Occulomotor Alignment Normal   Spontaneous Absent   Gaze-induced Absent   Smooth Pursuits Intact   Saccades Slow   Comment convergence WFL     Vestibulo-Occular  Reflex   VOR 1 Head Only (x 1 viewing) Impaired, catch up saccades, feels "woozy"   VOR to Slow Head Movement Normal   VOR Cancellation Normal   Comment HIT: positive to L     Positional Testing   Dix-Hallpike Dix-Hallpike Right;Dix-Hallpike Left     Dix-Hallpike Right   Dix-Hallpike Right Duration 0   Dix-Hallpike Right Symptoms No nystagmus     Dix-Hallpike Left   Dix-Hallpike Left Duration 20 seconds   Dix-Hallpike Left Symptoms Downbeat, left rotatory nystagmus                Vestibular Treatment/Exercise - 03/18/16 1342      Vestibular Treatment/Exercise   Vestibular Treatment Provided Canalith Repositioning   Canalith Repositioning Epley Manuever Left      EPLEY MANUEVER LEFT   Number of Reps  2   Overall Response  Improved Symptoms               PT Education - 03/18/16 1342    Education provided Yes   Education Details BPPV, labyrinthitis, clinical findings, PT goals and POC   Person(s) Educated Patient;Spouse   Methods Explanation;Demonstration;Handout   Comprehension Verbalized understanding             PT Long Term Goals - 03/18/16 1349      PT LONG TERM GOAL #1   Title (TARGET DATE FOR ALL LTG 05/02/2016) Will report resolution of vertigo during positional testing   Baseline L BPPV   Time 6   Period Weeks   Status New     PT LONG TERM GOAL #2   Title Pt will be independent with vestibular and balance HEP   Time 6   Period Weeks   Status New     PT LONG TERM GOAL #3   Title Will participate in DVA and other testing for hypofunction with LTG to be set   Time 6   Period Weeks     PT LONG TERM GOAL #4   Title Will report 17 point improvement on [place] for improved function overall   Baseline 68   Time 6   Period Weeks   Status New               Plan - 03/18/16 1345    Clinical Impression Statement Pt is a 66 year old female presenting to OPPT neuro for medium complexity PT evaluation for vertigo and impaired balance.  Pt's PMH significant for the following: Parkinson's disease. The following deficits were noted during pt's exam: vertigo, L anterior canal canalithiasis, vestibular hypofunction, and impaired balance.  Pt would benefit from skilled PT to address these impairments and functional limitations  to maximize functional mobility independence and reduce falls risk.   Rehab Potential Good   Clinical Impairments Affecting Rehab Potential Parkinson's disease   PT Frequency 1x / week   PT Duration 6 weeks   PT Treatment/Interventions ADLs/Self Care Home Management;Canalith Repostioning;Gait training;Functional mobility training;Therapeutic activities;Therapeutic exercise;Balance training;Neuromuscular re-education;Patient/family education;Vestibular   PT Next Visit Plan Re-assessment of all canals BPPV; continue assessment of hypofunction: head shaking test, DVA, provide pt with x 1 viewing   PT Home Exercise Plan x1 viewing   Consulted and Agree with Plan of Care Patient;Family member/caregiver   Family Member Consulted husband      Patient will benefit from skilled therapeutic intervention in order to improve the following deficits and impairments:  Decreased balance, Dizziness, Difficulty walking  Visit Diagnosis: Dizziness and giddiness  BPPV (benign paroxysmal positional vertigo), left  Unsteadiness on feet      G-Codes - 05-Apr-2016 1505    Functional Limitation Mobility: Walking and moving around  Based on DHI of 68   Mobility: Walking and Moving Around Current Status [id number]) At least 40 percent but less than 60 percent impaired, limited or restricted   Mobility: Walking and Moving Around Goal Status [phone]) At least 1 percent but less than 20 percent impaired, limited or restricted       Problem List Patient Active Problem List   Diagnosis Date Noted  . Vertigo 03/12/2016  . Chest congestion 02/24/2015  . Peripheral edema 07/09/2014  . Restrictive lung disease 12/20/2013  . DOE  (dyspnea on exertion) 11/18/2013  . Hot flashes 11/18/2013  . Chest pain 08/04/2013  . Memory impairment 08/04/2013  . Orthostasis 02/26/2013  . Tachycardia 02/24/2013  . Coronary artery calcification seen on CAT scan 01/01/2013  . Tendonitis, calcific, shoulder 01/01/2013  . OSA (obstructive sleep apnea) 01/01/2013  . PD (Parkinson's disease) ([place]) 11/26/2011  . Hematochezia 11/09/2011  . Colon polyps 11/09/2011  . IBS (irritable bowel syndrome) 11/09/2011  . Anosmia 11/06/2010  . Impaired glucose tolerance 11/04/2010  . Hearing loss 05/07/2010  . Preventative health care 05/07/2010  . BUNION, RIGHT FOOT 09/18/2009  . GERD 05/24/2008  . OSTEOPOROSIS 05/24/2008  . Hyperlipidemia 09/23/2006  . Anxiety state 09/23/2006  . Depression 09/23/2006  . CARPAL TUNNEL SYNDROME, BILATERAL 09/23/2006  . Essential hypertension 09/23/2006  . DIVERTICULOSIS, COLON 09/23/2006   Raylene Everts, PT, DPT 04/05/16    3:14 PM  [facility] [address] [place], [place], [address] Phone: [phone]   Fax:  [phone]  Name: Destiny Roth MRN: [id number] Date of Birth: [birth year]

## 2016-03-22 ENCOUNTER — Ambulatory Visit: Payer: Medicare Other

## 2016-03-22 DIAGNOSIS — R42 Dizziness and giddiness: Secondary | ICD-10-CM | POA: Diagnosis not present

## 2016-03-22 DIAGNOSIS — H8112 Benign paroxysmal vertigo, left ear: Secondary | ICD-10-CM

## 2016-03-22 DIAGNOSIS — R2681 Unsteadiness on feet: Secondary | ICD-10-CM | POA: Diagnosis not present

## 2016-03-22 NOTE — Patient Instructions (Addendum)
Gaze Stabilization: Tip Card  1.Target must remain in focus, not blurry, and appear stationary while head is in motion. 2.Perform exercises with small head movements (45 to either side of midline). 3.Increase speed of head motion so long as target is in focus. 4.If you wear eyeglasses, be sure you can see target through lens (therapist will give specific instructions for bifocal / progressive lenses). 5.These exercises may provoke dizziness or nausea. Work through these symptoms. If too dizzy, slow head movement slightly. Rest between each exercise. 6.Exercises demand concentration; avoid distractions. 7.For safety, perform standing exercises close to a counter, wall, corner, or next to someone.  Copyright  VHI. All rights reserved.   Gaze Stabilization: Sitting    Keeping eyes on target on wall 3-5 feet away, tilt head down 15-30 and move head side to side for __20-30__ seconds. Do __1-2__ sessions per day.  Copyright  VHI. All rights reserved.

## 2016-03-22 NOTE — Therapy (Signed)
[facility] [address] [place], [place], [address] Phone: [phone]   Fax:  [phone]  Physical Therapy Treatment  Patient Details  Name: Destiny Roth MRN: [id number] Date of Birth: Jun 18, 1950 Referring Provider: Golden Circle, FNP  Encounter Date: 03/22/2016      PT End of Session - 03/22/16 1310    Visit Number 2   Number of Visits 7   Date for PT Re-Evaluation 05/02/16   Authorization Type Medicare Part A and B-G code and progress note every 10th visit   PT Start Time 1146   PT Stop Time 1235   PT Time Calculation (min) 49 min   Activity Tolerance Patient tolerated treatment well   Behavior During Therapy [facility] for tasks assessed/performed      Past Medical History:  Diagnosis Date  . Abdominal pain, epigastric 09/18/2009  . Anosmia 11/06/2010  . ANXIETY 09/23/2006  . BUNION, RIGHT FOOT 09/18/2009  . CARPAL TUNNEL SYNDROME, BILATERAL 09/23/2006  . DEPRESSION 09/23/2006  . DIVERTICULOSIS, COLON 09/23/2006  . GERD 05/24/2008  . GLUCOSE INTOLERANCE 05/24/2008  . Hemorrhoids   . HYPERLIPIDEMIA 09/23/2006  . HYPERTENSION 09/23/2006  . IBS 09/23/2006  . Impaired glucose tolerance 11/04/2010  . OSTEOPOROSIS 05/24/2008  . Parkinson's disease ([place])   . Restrictive lung disease 12/20/2013    Past Surgical History:  Procedure Laterality Date  . CARDIAC CATHETERIZATION    . CATARACT EXTRACTION    . COLONOSCOPY W/ BIOPSIES  2008   Dr. Collene Mares -negative random bxs  . ESOPHAGOGASTRODUODENOSCOPY  2008   Dr. Harriette Bouillon duodenal ulcer  . FOOT SURGERY Bilateral 2015  . LEFT AND RIGHT HEART CATHETERIZATION WITH CORONARY ANGIOGRAM N/A 11/09/2013   Procedure: LEFT AND RIGHT HEART CATHETERIZATION WITH CORONARY ANGIOGRAM;  Surgeon: Peter M Martinique, MD;  Location: [facility] CATH LAB;  Service: Cardiovascular;  Laterality: N/A;  . nasal septum repair    . s/p bilat CTS    . s/p fibroid ablation    . s/p ganglion cyst right  wrist    . s/p right thumb trigger finger surgury    . SHOULDER SURGERY Right 2015  . TUBAL LIGATION      There were no vitals filed for this visit.      Subjective Assessment - 03/22/16 1149    Subjective Pt reports she feels about the same since last visit.    Patient is accompained by: Family member   Pertinent History Parkinson's   Patient Stated Goals To get rid of the dizziness and buzzing in ears   Currently in Pain? No/denies                Vestibular Assessment - 03/22/16 1156      Visual Acuity   Static Line 5   Dynamic Line 2: pt rated dizziness 5/10     Positional Testing   Dix-Hallpike Dix-Hallpike Right;Dix-Hallpike Left   Horizontal Canal Testing Horizontal Canal Right;Horizontal Canal Left;Horizontal Canal Right Intensity;Horizontal Canal Left Intensity     Dix-Hallpike Right   Dix-Hallpike Right Duration none   Dix-Hallpike Right Symptoms No nystagmus     Dix-Hallpike Left   Dix-Hallpike Left Duration 15 sec, 6-7/10   Dix-Hallpike Left Symptoms Upbeat, left rotatory nystagmus     Horizontal Canal Right   Horizontal Canal Right Duration none   Horizontal Canal Right Symptoms Normal     Horizontal Canal Left   Horizontal Canal Left Duration none   Horizontal Canal Left Symptoms Normal  Vestibular Treatment/Exercise - 03/22/16 1231      Vestibular Treatment/Exercise   Vestibular Treatment Provided Gaze   Gaze Exercises X1 Viewing Horizontal      EPLEY MANUEVER LEFT   Number of Reps  2   Overall Response  Symptoms Resolved    RESPONSE DETAILS LEFT Pt reported 0/10 dizziness.     X1 Viewing Horizontal   Foot Position seated   Time --  20-30   Reps 2               PT Education - 03/22/16 1309    Education provided Yes   Education Details PT discussed positional test findings. PT spent extensive time educating pt on occulomotor exam findings, importance of VOR, DVA/SVA findings, and BPPV. PT also  provided pt with gaze stabilization HEP.    Person(s) Educated Patient;Spouse   Methods Explanation;Demonstration;Verbal cues;Handout   Comprehension Verbalized understanding;Returned demonstration             PT Long Term Goals - 03/18/16 1349      PT LONG TERM GOAL #1   Title (TARGET DATE FOR ALL LTG 05/02/2016) Will report resolution of vertigo during positional testing   Baseline L BPPV   Time 6   Period Weeks   Status New     PT LONG TERM GOAL #2   Title Pt will be independent with vestibular and balance HEP   Time 6   Period Weeks   Status New     PT LONG TERM GOAL #3   Title Will participate in DVA and other testing for hypofunction with LTG to be set   Time 6   Period Weeks     PT LONG TERM GOAL #4   Title Will report 17 point improvement on [place] for improved function overall   Baseline 68   Time 6   Period Weeks   Status New               Plan - 03/22/16 1311    Clinical Impression Statement Positional testing s/s was again consistent with L anterior BPPV. Pt's s/s improved after two reps of L Epley's manuever. Pt required seated rest breaks after each treatment to allow wooziness to subside. Pt's DVA line difference was signficant, PT provided pt with gaze stabilization HEP. Pt would continue to benefit from skilled PT to improve dizziness and safety during functional mobility.    Rehab Potential Good   Clinical Impairments Affecting Rehab Potential Parkinson's disease   PT Frequency 1x / week   PT Duration 6 weeks   PT Treatment/Interventions ADLs/Self Care Home Management;Canalith Repostioning;Gait training;Functional mobility training;Therapeutic activities;Therapeutic exercise;Balance training;Neuromuscular re-education;Patient/family education;Vestibular   PT Next Visit Plan Treat L BPPV as indicated.    PT Home Exercise Plan x1 viewing   Consulted and Agree with Plan of Care Patient;Family member/caregiver   Family Member Consulted husband       Patient will benefit from skilled therapeutic intervention in order to improve the following deficits and impairments:  Decreased balance, Dizziness, Difficulty walking  Visit Diagnosis: Dizziness and giddiness  BPPV (benign paroxysmal positional vertigo), left     Problem List Patient Active Problem List   Diagnosis Date Noted  . Vertigo 03/12/2016  . Chest congestion 02/24/2015  . Peripheral edema 07/09/2014  . Restrictive lung disease 12/20/2013  . DOE (dyspnea on exertion) 11/18/2013  . Hot flashes 11/18/2013  . Chest pain 08/04/2013  . Memory impairment 08/04/2013  . Orthostasis 02/26/2013  . Tachycardia 02/24/2013  .  Coronary artery calcification seen on CAT scan 01/01/2013  . Tendonitis, calcific, shoulder 01/01/2013  . OSA (obstructive sleep apnea) 01/01/2013  . PD (Parkinson's disease) ([place]) 11/26/2011  . Hematochezia 11/09/2011  . Colon polyps 11/09/2011  . IBS (irritable bowel syndrome) 11/09/2011  . Anosmia 11/06/2010  . Impaired glucose tolerance 11/04/2010  . Hearing loss 05/07/2010  . Preventative health care 05/07/2010  . BUNION, RIGHT FOOT 09/18/2009  . GERD 05/24/2008  . OSTEOPOROSIS 05/24/2008  . Hyperlipidemia 09/23/2006  . Anxiety state 09/23/2006  . Depression 09/23/2006  . CARPAL TUNNEL SYNDROME, BILATERAL 09/23/2006  . Essential hypertension 09/23/2006  . DIVERTICULOSIS, COLON 09/23/2006    Talaysia Pinheiro L 03/22/2016, 1:21 PM  [facility] [address] [place], [place], [address] Phone: [phone]   Fax:  [phone]  Name: YORDANOS BROCKIE MRN: [id number] Date of Birth: 09-15-1950  Geoffry Paradise, PT,DPT 03/22/16 1:22 PM Phone: [phone] Fax: [phone]

## 2016-03-29 ENCOUNTER — Encounter: Payer: Self-pay | Admitting: Physical Therapy

## 2016-03-29 ENCOUNTER — Ambulatory Visit: Payer: Medicare Other | Attending: Family | Admitting: Physical Therapy

## 2016-03-29 DIAGNOSIS — H8112 Benign paroxysmal vertigo, left ear: Secondary | ICD-10-CM | POA: Insufficient documentation

## 2016-03-29 DIAGNOSIS — R2681 Unsteadiness on feet: Secondary | ICD-10-CM | POA: Diagnosis not present

## 2016-03-29 DIAGNOSIS — R42 Dizziness and giddiness: Secondary | ICD-10-CM | POA: Diagnosis not present

## 2016-03-29 NOTE — Therapy (Signed)
[facility] [address] [place], [place], [address] Phone: [phone]   Fax:  [phone]  Physical Therapy Treatment  Patient Details  Name: Destiny Roth MRN: [id number] Date of Birth: [birth year] Referring Provider: Golden Circle, FNP  Encounter Date: 03/29/2016      PT End of Session - 03/29/16 1255    Visit Number 3   Number of Visits 7   Date for PT Re-Evaluation 05/02/16   Authorization Type Medicare Part A and B-G code and progress note every 10th visit   PT Start Time 1152   PT Stop Time 1238   PT Time Calculation (min) 46 min   Activity Tolerance Patient tolerated treatment well   Behavior During Therapy [facility] for tasks assessed/performed      Past Medical History:  Diagnosis Date  . Abdominal pain, epigastric 09/18/2009  . Anosmia 11/06/2010  . ANXIETY 09/23/2006  . BUNION, RIGHT FOOT 09/18/2009  . CARPAL TUNNEL SYNDROME, BILATERAL 09/23/2006  . DEPRESSION 09/23/2006  . DIVERTICULOSIS, COLON 09/23/2006  . GERD 05/24/2008  . GLUCOSE INTOLERANCE 05/24/2008  . Hemorrhoids   . HYPERLIPIDEMIA 09/23/2006  . HYPERTENSION 09/23/2006  . IBS 09/23/2006  . Impaired glucose tolerance 11/04/2010  . OSTEOPOROSIS 05/24/2008  . Parkinson's disease ([place])   . Restrictive lung disease 12/20/2013    Past Surgical History:  Procedure Laterality Date  . CARDIAC CATHETERIZATION    . CATARACT EXTRACTION    . COLONOSCOPY W/ BIOPSIES  2008   Dr. Collene Mares -negative random bxs  . ESOPHAGOGASTRODUODENOSCOPY  2008   Dr. Harriette Bouillon duodenal ulcer  . FOOT SURGERY Bilateral 2015  . LEFT AND RIGHT HEART CATHETERIZATION WITH CORONARY ANGIOGRAM N/A 11/09/2013   Procedure: LEFT AND RIGHT HEART CATHETERIZATION WITH CORONARY ANGIOGRAM;  Surgeon: Peter M Martinique, MD;  Location: [facility] CATH LAB;  Service: Cardiovascular;  Laterality: N/A;  . nasal septum repair    . s/p bilat CTS    . s/p fibroid ablation    . s/p ganglion cyst right wrist     . s/p right thumb trigger finger surgury    . SHOULDER SURGERY Right 2015  . TUBAL LIGATION      There were no vitals filed for this visit.      Subjective Assessment - 03/29/16 1155    Subjective Reports vertigo is improved overall but still has episodes of dizziness and times when it is "manageable".  Feels like vision in L eye is worse-feels unclear and feels like objects are moving and that eye lid is heavy.  Still reporting buzzing in her ears-intermittent.  Dizziness worsens with movement.   Patient is accompained by: Family member   Pertinent History Parkinson's   Patient Stated Goals To get rid of the dizziness and buzzing in ears   Currently in Pain? No/denies           Vestibular Assessment - 03/29/16 1248      Positional Testing   Dix-Hallpike Dix-Hallpike Left   Sidelying Test Sidelying Right;Sidelying Left     Dix-Hallpike Left   Dix-Hallpike Left Duration 10 seconds   Dix-Hallpike Left Symptoms Upbeat, left rotatory nystagmus     Sidelying Right   Sidelying Right Duration 0   Sidelying Right Symptoms No nystagmus     Sidelying Left   Sidelying Left Duration 10 seconds   Sidelying Left Symptoms Upbeat, left rotatory nystagmus          [facility] Adult PT Treatment/Exercise - 03/29/16 1252  Ambulation/Gait   Gait Comments Performed gait forwards and retro x 25' each direction x 3-4 reps increasing gait speed each time, performed vertical and horizontal head turns, min A         Vestibular Treatment/Exercise - 03/29/16 1250      Vestibular Treatment/Exercise   Vestibular Treatment Provided Canalith Repositioning;Habituation;Gaze   Canalith Repositioning Epley Manuever Left   Habituation Exercises [employer]   Gaze Exercises X1 Viewing Horizontal;X1 Viewing Vertical      EPLEY MANUEVER LEFT   Number of Reps  1   Overall Response  Symptoms Resolved    RESPONSE DETAILS LEFT mild dizziness when returning to sitting but no nystagmus during L  sidelying     Nestor Lewandowsky   Number of Reps  1   Symptom Description  vertigo with L sidelying     X1 Viewing Horizontal   Foot Position feet together   Reps 2   Comments 30 seconds, verbal cues for smaller arc of movement and to keep target in focus     X1 Viewing Vertical   Foot Position feet together   Reps 2   Comments 30 seconds           PT Education - 03/29/16 1254    Education provided Yes   Education Details reviewed and updated HEP, habituation exercises, BPPV, energy conservation in community, aerobic exercise daily   Person(s) Educated Patient;Spouse   Methods Explanation;Demonstration;Handout   Comprehension Verbalized understanding;Returned demonstration             PT Long Term Goals - 03/18/16 1349      PT LONG TERM GOAL #1   Title (TARGET DATE FOR ALL LTG 05/02/2016) Will report resolution of vertigo during positional testing   Baseline L BPPV   Time 6   Period Weeks   Status New     PT LONG TERM GOAL #2   Title Pt will be independent with vestibular and balance HEP   Time 6   Period Weeks   Status New     PT LONG TERM GOAL #3   Title Will participate in DVA and other testing for hypofunction with LTG to be set   Time 6   Period Weeks     PT LONG TERM GOAL #4   Title Will report 17 point improvement on [place] for improved function overall   Baseline 68   Time 6   Period Weeks   Status New               Plan - 03/29/16 1256    Clinical Impression Statement Pt making good progress but continues to report symptoms.  Positional testing continues to reveal L BPPV, performed one canalith repositioning maneuver.  Progressed HEP and provided pt with habituation for self treatment at home.  Also performed gait training with head turns.  Provided extensive education about energy conservation and avoiding over stimulation today due to pt reporting needing to run errands at multiple large stores.  Pt verbalized understanding.  Also provided pt  with education on addition of aerobic exercise to daily routine including riding stationary bike 10-20 minutes a day.  Will continue to assess and address to continue to progress towards targeted goals.    Rehab Potential Good   Clinical Impairments Affecting Rehab Potential Parkinson's disease   PT Frequency 1x / week   PT Duration 6 weeks   PT Treatment/Interventions ADLs/Self Care Home Management;Canalith Repostioning;Gait training;Functional mobility training;Therapeutic activities;Therapeutic exercise;Balance training;Neuromuscular re-education;Patient/family education;Vestibular  PT Next Visit Plan Treat L BPPV as indicated. Add corner balance exercises, aerobic exercise.     PT Home Exercise Plan x1 viewing, habituation   Consulted and Agree with Plan of Care Patient;Family member/caregiver   Family Member Consulted husband      Patient will benefit from skilled therapeutic intervention in order to improve the following deficits and impairments:  Decreased balance, Dizziness, Difficulty walking  Visit Diagnosis: BPPV (benign paroxysmal positional vertigo), left  Dizziness and giddiness  Unsteadiness on feet     Problem List Patient Active Problem List   Diagnosis Date Noted  . Vertigo 03/12/2016  . Chest congestion 02/24/2015  . Peripheral edema 07/09/2014  . Restrictive lung disease 12/20/2013  . DOE (dyspnea on exertion) 11/18/2013  . Hot flashes 11/18/2013  . Chest pain 08/04/2013  . Memory impairment 08/04/2013  . Orthostasis 02/26/2013  . Tachycardia 02/24/2013  . Coronary artery calcification seen on CAT scan 01/01/2013  . Tendonitis, calcific, shoulder 01/01/2013  . OSA (obstructive sleep apnea) 01/01/2013  . PD (Parkinson's disease) ([place]) 11/26/2011  . Hematochezia 11/09/2011  . Colon polyps 11/09/2011  . IBS (irritable bowel syndrome) 11/09/2011  . Anosmia 11/06/2010  . Impaired glucose tolerance 11/04/2010  . Hearing loss 05/07/2010  . Preventative  health care 05/07/2010  . BUNION, RIGHT FOOT 09/18/2009  . GERD 05/24/2008  . OSTEOPOROSIS 05/24/2008  . Hyperlipidemia 09/23/2006  . Anxiety state 09/23/2006  . Depression 09/23/2006  . CARPAL TUNNEL SYNDROME, BILATERAL 09/23/2006  . Essential hypertension 09/23/2006  . DIVERTICULOSIS, COLON 09/23/2006    Raylene Everts, PT, DPT 03/29/16    1:06 PM    [facility] [address] [place], [place], [address] Phone: [phone]   Fax:  [phone]  Name: ALIEYA MCPHILLIPS MRN: [id number] Date of Birth: [birth year]

## 2016-03-29 NOTE — Patient Instructions (Signed)
Gaze Stabilization - Tip Card  1.Target must remain in focus, not blurry, and appear stationary while head is in motion. 2.Perform exercises with small head movements (45 to either side of midline). 3.Increase speed of head motion so long as target is in focus. 4.If you wear eyeglasses, be sure you can see target through lens (therapist will give specific instructions for bifocal / progressive lenses). 5.These exercises may provoke dizziness or nausea. Work through these symptoms. If too dizzy, slow head movement slightly. Rest between each exercise. 6.Exercises demand concentration; avoid distractions. 7.For safety, perform standing exercises close to a counter, wall, corner, or next to someone.  Copyright  VHI. All rights reserved.   Gaze Stabilization - Standing Feet Apart   Feet shoulder width apart, keeping eyes on target on wall 3 feet away, tilt head down slightly and move head side to side for 30 seconds. Repeat while moving head up and down for 30 seconds. *Work up to tolerating 60 seconds, as able. Do 2-3 sessions per day.   Copyright  VHI. All rights reserved.   Habituation - Tip Card  1.The goal of habituation training is to assist in decreasing symptoms of vertigo, dizziness, or nausea provoked by specific head and body motions. 2.These exercises may initially increase symptoms; however, be persistent and work through symptoms. With repetition and time, the exercises will assist in reducing or eliminating symptoms. 3.Exercises should be stopped and discussed with the therapist if you experience any of the following: - Sudden change or fluctuation in hearing - New onset of ringing in the ears, or increase in current intensity - Any fluid discharge from the ear - Severe pain in neck or back - Extreme nausea   Habituation - Sit to Side-Lying   Sit on edge of bed. Lie down onto the right side and hold until dizziness stops, plus 20 seconds.  Return to sitting and wait  until dizziness stops, plus 20 seconds.  Repeat to the left side. Repeat sequence 3 times working up to 5 times per session. Do 2 sessions per day.  Copyright  VHI. All rights reserved.

## 2016-04-02 DIAGNOSIS — H35372 Puckering of macula, left eye: Secondary | ICD-10-CM | POA: Diagnosis not present

## 2016-04-02 DIAGNOSIS — H43812 Vitreous degeneration, left eye: Secondary | ICD-10-CM | POA: Diagnosis not present

## 2016-04-02 DIAGNOSIS — Z961 Presence of intraocular lens: Secondary | ICD-10-CM | POA: Diagnosis not present

## 2016-04-02 DIAGNOSIS — H52203 Unspecified astigmatism, bilateral: Secondary | ICD-10-CM | POA: Diagnosis not present

## 2016-04-02 DIAGNOSIS — H5201 Hypermetropia, right eye: Secondary | ICD-10-CM | POA: Diagnosis not present

## 2016-04-02 DIAGNOSIS — H524 Presbyopia: Secondary | ICD-10-CM | POA: Diagnosis not present

## 2016-04-02 DIAGNOSIS — H5212 Myopia, left eye: Secondary | ICD-10-CM | POA: Diagnosis not present

## 2016-04-04 ENCOUNTER — Ambulatory Visit (INDEPENDENT_AMBULATORY_CARE_PROVIDER_SITE_OTHER): Payer: Medicare Other | Admitting: Internal Medicine

## 2016-04-04 ENCOUNTER — Encounter: Payer: Self-pay | Admitting: Internal Medicine

## 2016-04-04 VITALS — BP 126/86 | HR 100 | Ht <= 58 in | Wt 144.2 lb

## 2016-04-04 DIAGNOSIS — K589 Irritable bowel syndrome without diarrhea: Secondary | ICD-10-CM

## 2016-04-04 DIAGNOSIS — K649 Unspecified hemorrhoids: Secondary | ICD-10-CM

## 2016-04-04 MED ORDER — DICYCLOMINE HCL 20 MG PO TABS: 20.0000 mg | ORAL_TABLET | Freq: Four times a day (QID) | ORAL | 1 refills | Status: DC | PRN

## 2016-04-04 NOTE — Progress Notes (Signed)
HISTORY OF PRESENT ILLNESS:  Destiny Roth is a 66 y.o. female with past medical history as listed below who was initially evaluated 04/11/2015 upon self referral regarding a myriad of long-standing GI complaints. See that dictation for details. Had seen multiple prior gastroenterologists with evaluations as outlined. The impression at that time was irritable bowel syndrome, alternating version, and internal hemorrhoids. She presents today with a chief complaint of ongoing management of irritable bowel syndrome. She requests a refill of antispasmodic and rectal suppositories. At time of her last visit she was prescribed Bentyl 20 mg as needed. She states this helps her abdominal discomfort significantly. She continues with alternating bowel habits. She does take daily fiber supplementation which she also states has helped. She is interested and medicated suppositories, though states she is having some difficulty with insurance approval. No complaints. She does have a history of GERD but does not require medical therapy. Her last colonoscopy was in 2008. This was negative. She is due for follow-up this August. She is aware.  REVIEW OF SYSTEMS:  All non-GI ROS negative except for anxiety, back pain, visual change, depression, fatigue, hearing problems, ankle swelling, eating problems, urinary leakage, shortness of breath  Past Medical History:  Diagnosis Date  . Abdominal pain, epigastric 09/18/2009  . Anosmia 11/06/2010  . ANXIETY 09/23/2006  . BUNION, RIGHT FOOT 09/18/2009  . CARPAL TUNNEL SYNDROME, BILATERAL 09/23/2006  . DEPRESSION 09/23/2006  . DIVERTICULOSIS, COLON 09/23/2006  . GERD 05/24/2008  . GLUCOSE INTOLERANCE 05/24/2008  . Hemorrhoids   . HYPERLIPIDEMIA 09/23/2006  . HYPERTENSION 09/23/2006  . IBS 09/23/2006  . Impaired glucose tolerance 11/04/2010  . OSTEOPOROSIS 05/24/2008  . Parkinson's disease ([place])   . Restrictive lung disease 12/20/2013    Past Surgical History:  Procedure  Laterality Date  . CARDIAC CATHETERIZATION    . CATARACT EXTRACTION    . COLONOSCOPY W/ BIOPSIES  2008   Dr. Collene Mares -negative random bxs  . ESOPHAGOGASTRODUODENOSCOPY  2008   Dr. Harriette Bouillon duodenal ulcer  . FOOT SURGERY Bilateral 2015  . LEFT AND RIGHT HEART CATHETERIZATION WITH CORONARY ANGIOGRAM N/A 11/09/2013   Procedure: LEFT AND RIGHT HEART CATHETERIZATION WITH CORONARY ANGIOGRAM;  Surgeon: Peter M Martinique, MD;  Location: [facility] CATH LAB;  Service: Cardiovascular;  Laterality: N/A;  . nasal septum repair    . s/p bilat CTS    . s/p fibroid ablation    . s/p ganglion cyst right wrist    . s/p right thumb trigger finger surgury    . SHOULDER SURGERY Right 2015  . TUBAL LIGATION      Social History [place]  reports that she has never smoked. She has never used smokeless tobacco. She reports that she drinks alcohol. She reports that she does not use drugs.  family history includes Heart attack in her brother and mother; Heart disease (age of onset: 39) in her father; Hypertension in her brother, brother, brother, and father; Peripheral vascular disease in her mother.  Allergies  Allergen Reactions  . Lipitor [Atorvastatin] Other (See Comments)    myalgia  . Zocor [Simvastatin] Other (See Comments)    myalgias       PHYSICAL EXAMINATION: Vital signs: BP 126/86   Pulse 100   Ht 4' 8.69" (1.44 m) Comment: w/o shoes  Wt 144 lb 4 oz (65.4 kg)   BMI 31.55 kg/m   Constitutional: generally well-appearing, no acute distress Psychiatric: alert and oriented x3, cooperative Eyes: extraocular movements intact, anicteric, conjunctiva pink Mouth: oral pharynx  moist, no lesions Neck: supple without thyromegaly Lymph: no lymphadenopathy Cardiovascular: heart regular rate and rhythm, no murmur Lungs: clear to auscultation bilaterally Abdomen: soft, nontender, nondistended, no obvious ascites, no peritoneal signs, normal bowel sounds, no organomegaly Rectal:  Omitted Extremities: no clubbing cyanosis or lower extremity edema bilaterally Skin: no lesions on visible extremities Neuro: No focal deficits. Cranial nerves intact  ASSESSMENT:  #1. Irritable bowel syndrome. Alternating. Ongoing. Bowel habits improved with fiber #2. Abdominal pain secondary to irritable bowel syndrome. Improved with antispasmodic therapy #3. Symptomatic hemorrhoids #4. Last colonoscopy 2008 was negative   PLAN:  #1. Continue fiber #2. Refill prescription for Bentyl as needed for abdominal pain #3. Additional discussion on irritable bowel syndrome #4. Anusol HCC suppositories or the equivalent prescribed #5. Repeat screening colonoscopy August 2018 planned anniversary #6. Interval follow-up as needed  25 minutes spent face-to-face with the patient. Greater than 50% of the time use for counseling regarding her multiple chronic abdominal complaints, treatment plan, and the rationale. Also answered multiple questions

## 2016-04-04 NOTE — Patient Instructions (Signed)
I will try to send an appeal regarding your Bentyl .  Continue with you fiber and drinking water  You are due for a colonoscopy  08/2016

## 2016-04-05 ENCOUNTER — Ambulatory Visit: Payer: Medicare Other | Admitting: Physical Therapy

## 2016-04-05 ENCOUNTER — Encounter: Payer: Self-pay | Admitting: Physical Therapy

## 2016-04-05 DIAGNOSIS — R42 Dizziness and giddiness: Secondary | ICD-10-CM | POA: Diagnosis not present

## 2016-04-05 DIAGNOSIS — R2681 Unsteadiness on feet: Secondary | ICD-10-CM | POA: Diagnosis not present

## 2016-04-05 DIAGNOSIS — H8112 Benign paroxysmal vertigo, left ear: Secondary | ICD-10-CM

## 2016-04-05 NOTE — Therapy (Signed)
[facility] [address] [place], [place], [address] Phone: [phone]   Fax:  [phone]  Physical Therapy Treatment  Patient Details  Name: Destiny Roth MRN: [id number] Date of Birth: Nov 09, 1950 Referring Provider: Golden Circle, FNP  Encounter Date: 04/05/2016      PT End of Session - 04/05/16 1332    Visit Number 4   Number of Visits 7   Date for PT Re-Evaluation 05/02/16   Authorization Type Medicare Part A and B-G code and progress note every 10th visit   PT Start Time 1153   PT Stop Time 1315   PT Time Calculation (min) 82 min   Activity Tolerance Other (comment)  dizziness   Behavior During Therapy [facility] for tasks assessed/performed      Past Medical History:  Diagnosis Date  . Abdominal pain, epigastric 09/18/2009  . Anosmia 11/06/2010  . ANXIETY 09/23/2006  . BUNION, RIGHT FOOT 09/18/2009  . CARPAL TUNNEL SYNDROME, BILATERAL 09/23/2006  . DEPRESSION 09/23/2006  . DIVERTICULOSIS, COLON 09/23/2006  . GERD 05/24/2008  . GLUCOSE INTOLERANCE 05/24/2008  . Hemorrhoids   . HYPERLIPIDEMIA 09/23/2006  . HYPERTENSION 09/23/2006  . IBS 09/23/2006  . Impaired glucose tolerance 11/04/2010  . OSTEOPOROSIS 05/24/2008  . Parkinson's disease ([place])   . Restrictive lung disease 12/20/2013    Past Surgical History:  Procedure Laterality Date  . CARDIAC CATHETERIZATION    . CATARACT EXTRACTION    . COLONOSCOPY W/ BIOPSIES  2008   Dr. Collene Mares -negative random bxs  . ESOPHAGOGASTRODUODENOSCOPY  2008   Dr. Harriette Bouillon duodenal ulcer  . FOOT SURGERY Bilateral 2015  . LEFT AND RIGHT HEART CATHETERIZATION WITH CORONARY ANGIOGRAM N/A 11/09/2013   Procedure: LEFT AND RIGHT HEART CATHETERIZATION WITH CORONARY ANGIOGRAM;  Surgeon: Peter M Martinique, MD;  Location: [facility] CATH LAB;  Service: Cardiovascular;  Laterality: N/A;  . nasal septum repair    . s/p bilat CTS    . s/p fibroid ablation    . s/p ganglion cyst right wrist     . s/p right thumb trigger finger surgury    . SHOULDER SURGERY Right 2015  . TUBAL LIGATION      There were no vitals filed for this visit.      Subjective Assessment - 04/05/16 1156    Subjective Was not able to run errands after last visit due to feeling sick; has been attempting to go out to stores but visually stimulating environments aggravate symptoms.  She denies any more episodes of spinning but continues to feel very off balance, riding the bike and habituation exercises aggravate dysequilibrium.   Patient is accompained by: Family member   Pertinent History Parkinson's   Patient Stated Goals To get rid of the dizziness and buzzing in ears   Currently in Pain? No/denies           Vestibular Assessment - 04/05/16 1204      Positional Testing   Dix-Hallpike Dix-Hallpike Right;Dix-Hallpike Left   Sidelying Test Sidelying Right   Horizontal Canal Testing Horizontal Canal Right;Horizontal Canal Left     Dix-Hallpike Right   Dix-Hallpike Right Duration 0   Dix-Hallpike Right Symptoms No nystagmus     Dix-Hallpike Left   Dix-Hallpike Left Duration 10   Dix-Hallpike Left Symptoms Upbeat, left rotatory nystagmus     Sidelying Right   Sidelying Right Duration >1 minute   Sidelying Right Symptoms Downbeat, right rotatory nystagmus     Horizontal Canal Right   Horizontal Canal Right  Duration 0   Horizontal Canal Right Symptoms Normal     Horizontal Canal Left   Horizontal Canal Left Duration 0   Horizontal Canal Left Symptoms Normal         Vestibular Treatment/Exercise - 04/05/16 1215      Vestibular Treatment/Exercise   Vestibular Treatment Provided Canalith Repositioning   Canalith Repositioning Epley Manuever Left;[place] Procedure Right Anterior      EPLEY MANUEVER LEFT   Number of Reps  1   Overall Response  Symptoms Resolved     [place] Procedure Right Anterior   Number of Reps  3   Overall Response  No change   Response Details  Some ageotropic  nystagmus noted during treatment          PT Education - 04/05/16 1331    Education provided Yes   Education Details multi-canal BPPV, sleeping positions   Person(s) Educated Patient;Spouse   Methods Explanation   Comprehension Verbalized understanding             PT Long Term Goals - 03/18/16 1349      PT LONG TERM GOAL #1   Title (TARGET DATE FOR ALL LTG 05/02/2016) Will report resolution of vertigo during positional testing   Baseline L BPPV   Time 6   Period Weeks   Status New     PT LONG TERM GOAL #2   Title Pt will be independent with vestibular and balance HEP   Time 6   Period Weeks   Status New     PT LONG TERM GOAL #3   Title Will participate in DVA and other testing for hypofunction with LTG to be set   Time 6   Period Weeks     PT LONG TERM GOAL #4   Title Will report 17 point improvement on [place] for improved function overall   Baseline 68   Time 6   Period Weeks   Status New               Plan - 04/05/16 1341    Clinical Impression Statement Treatment session focused on re-assessment of all canals. Pt found to have persistent L posterior canal canalithiasis-treated with symptom resolution on L but noted to have R sided cupulolithiasis.   Performed treatment x 3 for R side with pt continuing to report mild spinning.  Pt noted to have ageotropic nystagmus while in treatment position.  In order to avoid fatiguing the system pt advised to return next week to futher assess R side and horizontal canals for cupulolithiasis.     Rehab Potential Good   Clinical Impairments Affecting Rehab Potential Parkinson's disease   PT Treatment/Interventions ADLs/Self Care Home Management;Canalith Repostioning;Gait training;Functional mobility training;Therapeutic activities;Therapeutic exercise;Balance training;Neuromuscular re-education;Patient/family education;Vestibular   PT Next Visit Plan reassess for R cupulolithiasis; assess horizontal canals   PT Home  Exercise Plan x1 viewing, habituation   Consulted and Agree with Plan of Care Patient;Family member/caregiver   Family Member Consulted husband      Patient will benefit from skilled therapeutic intervention in order to improve the following deficits and impairments:  Decreased balance, Dizziness, Difficulty walking  Visit Diagnosis: BPPV (benign paroxysmal positional vertigo), left  Dizziness and giddiness  Unsteadiness on feet     Problem List Patient Active Problem List   Diagnosis Date Noted  . Vertigo 03/12/2016  . Chest congestion 02/24/2015  . Peripheral edema 07/09/2014  . Restrictive lung disease 12/20/2013  . DOE (dyspnea on exertion) 11/18/2013  . Hot  flashes 11/18/2013  . Chest pain 08/04/2013  . Memory impairment 08/04/2013  . Orthostasis 02/26/2013  . Tachycardia 02/24/2013  . Coronary artery calcification seen on CAT scan 01/01/2013  . Tendonitis, calcific, shoulder 01/01/2013  . OSA (obstructive sleep apnea) 01/01/2013  . PD (Parkinson's disease) ([place]) 11/26/2011  . Hematochezia 11/09/2011  . Colon polyps 11/09/2011  . IBS (irritable bowel syndrome) 11/09/2011  . Anosmia 11/06/2010  . Impaired glucose tolerance 11/04/2010  . Hearing loss 05/07/2010  . Preventative health care 05/07/2010  . BUNION, RIGHT FOOT 09/18/2009  . GERD 05/24/2008  . OSTEOPOROSIS 05/24/2008  . Hyperlipidemia 09/23/2006  . Anxiety state 09/23/2006  . Depression 09/23/2006  . CARPAL TUNNEL SYNDROME, BILATERAL 09/23/2006  . Essential hypertension 09/23/2006  . DIVERTICULOSIS, COLON 09/23/2006   Raylene Everts, PT, DPT 04/05/16    1:49 PM    [facility] [address] [place], [place], [address] Phone: [phone]   Fax:  [phone]  Name: Destiny Roth MRN: [id number] Date of Birth: 08-14-1950

## 2016-04-07 ENCOUNTER — Ambulatory Visit (HOSPITAL_BASED_OUTPATIENT_CLINIC_OR_DEPARTMENT_OTHER): Payer: Medicare Other | Attending: Pulmonary Disease | Admitting: Pulmonary Disease

## 2016-04-07 VITALS — Ht <= 58 in | Wt 140.0 lb

## 2016-04-07 DIAGNOSIS — G4733 Obstructive sleep apnea (adult) (pediatric): Secondary | ICD-10-CM | POA: Insufficient documentation

## 2016-04-08 ENCOUNTER — Encounter: Payer: Self-pay | Admitting: Rehabilitative and Restorative Service Providers"

## 2016-04-10 ENCOUNTER — Ambulatory Visit: Payer: Medicare Other | Admitting: Physical Therapy

## 2016-04-10 ENCOUNTER — Encounter: Payer: Self-pay | Admitting: Physical Therapy

## 2016-04-10 DIAGNOSIS — H8112 Benign paroxysmal vertigo, left ear: Secondary | ICD-10-CM

## 2016-04-10 DIAGNOSIS — R2681 Unsteadiness on feet: Secondary | ICD-10-CM | POA: Diagnosis not present

## 2016-04-10 DIAGNOSIS — R42 Dizziness and giddiness: Secondary | ICD-10-CM | POA: Diagnosis not present

## 2016-04-10 NOTE — Patient Instructions (Signed)
Habituation - Tip Card  1.The goal of habituation training is to assist in decreasing symptoms of vertigo, dizziness, or nausea provoked by specific head and body motions. 2.These exercises may initially increase symptoms; however, be persistent and work through symptoms. With repetition and time, the exercises will assist in reducing or eliminating symptoms. 3.Exercises should be stopped and discussed with the therapist if you experience any of the following: - Sudden change or fluctuation in hearing - New onset of ringing in the ears, or increase in current intensity - Any fluid discharge from the ear - Severe pain in neck or back - Extreme nausea  Copyright  VHI. All rights reserved.   Habituation - Rolling   With pillow under head, start on back. Roll to your right side.  Hold until dizziness stops, plus 20 seconds and then roll to the left side.  Hold until dizziness stops, plus 20 seconds.  Repeat sequence 5 times per session. Do 2 sessions per day.    **FOR LETTER EXERCISE-perform in sitting

## 2016-04-10 NOTE — Therapy (Signed)
[facility] [address] [place], [place], [address] Phone: [phone]   Fax:  [phone]  Physical Therapy Treatment  Patient Details  Name: Destiny Roth MRN: [id number] Date of Birth: Feb 11, 1950 Referring Provider: Golden Circle, FNP  Encounter Date: 04/10/2016      PT End of Session - 04/10/16 1250    Visit Number 5   Number of Visits 7   Date for PT Re-Evaluation 05/02/16   Authorization Type Medicare Part A and B-G code and progress note every 10th visit   PT Start Time 1146   PT Stop Time 1239   PT Time Calculation (min) 53 min   Activity Tolerance Patient tolerated treatment well   Behavior During Therapy [facility] for tasks assessed/performed      Past Medical History:  Diagnosis Date  . Abdominal pain, epigastric 09/18/2009  . Anosmia 11/06/2010  . ANXIETY 09/23/2006  . BUNION, RIGHT FOOT 09/18/2009  . CARPAL TUNNEL SYNDROME, BILATERAL 09/23/2006  . DEPRESSION 09/23/2006  . DIVERTICULOSIS, COLON 09/23/2006  . GERD 05/24/2008  . GLUCOSE INTOLERANCE 05/24/2008  . Hemorrhoids   . HYPERLIPIDEMIA 09/23/2006  . HYPERTENSION 09/23/2006  . IBS 09/23/2006  . Impaired glucose tolerance 11/04/2010  . OSTEOPOROSIS 05/24/2008  . Parkinson's disease ([place])   . Restrictive lung disease 12/20/2013    Past Surgical History:  Procedure Laterality Date  . CARDIAC CATHETERIZATION    . CATARACT EXTRACTION    . COLONOSCOPY W/ BIOPSIES  2008   Dr. Collene Mares -negative random bxs  . ESOPHAGOGASTRODUODENOSCOPY  2008   Dr. Harriette Bouillon duodenal ulcer  . FOOT SURGERY Bilateral 2015  . LEFT AND RIGHT HEART CATHETERIZATION WITH CORONARY ANGIOGRAM N/A 11/09/2013   Procedure: LEFT AND RIGHT HEART CATHETERIZATION WITH CORONARY ANGIOGRAM;  Surgeon: Peter M Martinique, MD;  Location: [facility];  Service: Cardiovascular;  Laterality: N/A;  . nasal septum repair    . s/p bilat CTS    . s/p fibroid ablation    . s/p ganglion cyst right  wrist    . s/p right thumb trigger finger surgury    . SHOULDER SURGERY Right 2015  . TUBAL LIGATION      There were no vitals filed for this visit.      Subjective Assessment - 04/10/16 1148    Subjective Had sleep study on Monday; did not rest well.  Reports not feeling well over the weekend and was not able to come for extra visit on Monday due to weather.  Still reports some wooziness with supine > sit.   Patient is accompained by: Family member   Pertinent History Parkinson's   Patient Stated Goals To get rid of the dizziness and buzzing in ears   Currently in Pain? No/denies           Vestibular Assessment - 04/10/16 1204      Positional Testing   Dix-Hallpike Dix-Hallpike Right;Dix-Hallpike Left   Horizontal Canal Testing Horizontal Canal Right;Horizontal Canal Left     Dix-Hallpike Right   Dix-Hallpike Right Duration 0   Dix-Hallpike Right Symptoms No nystagmus     Dix-Hallpike Left   Dix-Hallpike Left Duration 0   Dix-Hallpike Left Symptoms No nystagmus     Horizontal Canal Right   Horizontal Canal Right Duration 0   Horizontal Canal Right Symptoms Normal     Horizontal Canal Left   Horizontal Canal Left Duration >30   Horizontal Canal Left Symptoms Ageotrophic  Vestibular Treatment/Exercise - 04/10/16 1211      Vestibular Treatment/Exercise   Vestibular Treatment Provided Canalith Repositioning   Canalith Repositioning [place] Right;Canal Roll Right     Canal Roll Right   Number of Reps  1   Overall Response  No change   Response Details  slightly nauseous after final treatment     Appiani Right   Number of Reps  3   Overall Response  No Change           PT Education - 04/10/16 1241    Education provided Yes   Education Details Cupulolithiasis, repeated rolling habituation, HEP   Person(s) Educated Patient   Methods Explanation;Demonstration;Handout   Comprehension Verbalized understanding;Returned demonstration             PT Long Term Goals - 03/18/16 1349      PT LONG TERM GOAL #1   Title (TARGET DATE FOR ALL LTG 05/02/2016) Will report resolution of vertigo during positional testing   Baseline L BPPV   Time 6   Period Weeks   Status New     PT LONG TERM GOAL #2   Title Pt will be independent with vestibular and balance HEP   Time 6   Period Weeks   Status New     PT LONG TERM GOAL #3   Title Will participate in DVA and other testing for hypofunction with LTG to be set   Time 6   Period Weeks     PT LONG TERM GOAL #4   Title Will report 17 point improvement on [place] for improved function overall   Baseline 68   Time 6   Period Weeks   Status New               Plan - 04/10/16 1251    Clinical Impression Statement Treatment session with focus on re-assessment of all canals.  R and L anterior and posterior canals all cleared with no nystagmus or vertigo.  Pt was found to have ageotropic nystagmus with rolling to L side, indicating R horizontal canal cupulolithiasis.  Performed 3-4 reps repositioning maneuvers with little to no change in symptoms.  Provided pt with repeated rolling for habituation and self treatment while out of town and advised pt to perform x 1 viewing in sitting due to severity of symptoms afterwards.  Pt to return for f/u when she returns from her trip on 3/28.     Clinical Impairments Affecting Rehab Potential Parkinson's disease   PT Treatment/Interventions ADLs/Self Care Home Management;Canalith Repostioning;Gait training;Functional mobility training;Therapeutic activities;Therapeutic exercise;Balance training;Neuromuscular re-education;Patient/family education;Vestibular   PT Next Visit Plan reassess for R horizontal cupulolithiasis; treat if indicated-prepare with 15-20 reps head pitches, nods or repeated rolling   PT Home Exercise Plan x1 viewing, habituation   Consulted and Agree with Plan of Care Patient      Patient will benefit from skilled  therapeutic intervention in order to improve the following deficits and impairments:  Decreased balance, Dizziness, Difficulty walking  Visit Diagnosis: BPPV (benign paroxysmal positional vertigo), left  Dizziness and giddiness  Unsteadiness on feet     Problem List Patient Active Problem List   Diagnosis Date Noted  . Vertigo 03/12/2016  . Chest congestion 02/24/2015  . Peripheral edema 07/09/2014  . Restrictive lung disease 12/20/2013  . DOE (dyspnea on exertion) 11/18/2013  . Hot flashes 11/18/2013  . Chest pain 08/04/2013  . Memory impairment 08/04/2013  . Orthostasis 02/26/2013  . Tachycardia 02/24/2013  . Coronary artery  calcification seen on CAT scan 01/01/2013  . Tendonitis, calcific, shoulder 01/01/2013  . OSA (obstructive sleep apnea) 01/01/2013  . PD (Parkinson's disease) ([place]) 11/26/2011  . Hematochezia 11/09/2011  . Colon polyps 11/09/2011  . IBS (irritable bowel syndrome) 11/09/2011  . Anosmia 11/06/2010  . Impaired glucose tolerance 11/04/2010  . Hearing loss 05/07/2010  . Preventative health care 05/07/2010  . BUNION, RIGHT FOOT 09/18/2009  . GERD 05/24/2008  . OSTEOPOROSIS 05/24/2008  . Hyperlipidemia 09/23/2006  . Anxiety state 09/23/2006  . Depression 09/23/2006  . CARPAL TUNNEL SYNDROME, BILATERAL 09/23/2006  . Essential hypertension 09/23/2006  . DIVERTICULOSIS, COLON 09/23/2006   Raylene Everts, PT, DPT 04/10/16    12:57 PM   [facility] [address] [place], [place], [address] Phone: [phone]   Fax:  [phone]  Name: Destiny Roth MRN: [id number] Date of Birth: 1950-05-25

## 2016-04-16 ENCOUNTER — Telehealth: Payer: Self-pay | Admitting: Pulmonary Disease

## 2016-04-16 DIAGNOSIS — G4730 Sleep apnea, unspecified: Secondary | ICD-10-CM | POA: Diagnosis not present

## 2016-04-16 DIAGNOSIS — G4733 Obstructive sleep apnea (adult) (pediatric): Secondary | ICD-10-CM

## 2016-04-16 NOTE — Procedures (Signed)
Patient Name: Destiny Roth, Destiny Roth Study Date: 04/07/2016 Gender: Female D.O.B: 1950/04/29 Age (years): 74 Referring Provider: Kara Mead MD, ABSM Height (inches): 58 Interpreting Physician: Kara Mead MD, ABSM Weight (lbs): 140 RPSGT: Madelon Lips BMI: 29 MRN: [id number] Neck Size: 14.50 CLINICAL INFORMATION The patient is referred for a CPAP titration to treat sleep apnea.  Date of NPSG:  2015 showed mild OSA - AHI 12/h- No evidence of increased muscle tone during REM sleep, REM AHI was 36/h. There were 58 RERAs with an RDI of 23 events per hour  SLEEP STUDY TECHNIQUE As per the AASM Manual for the Scoring of Sleep and Associated Events v2.3 (April 2016) with a hypopnea requiring 4% desaturations.  The channels recorded and monitored were frontal, central and occipital EEG, electrooculogram (EOG), submentalis EMG (chin), nasal and oral airflow, thoracic and abdominal wall motion, anterior tibialis EMG, snore microphone, electrocardiogram, and pulse oximetry. Continuous positive airway pressure (CPAP) was initiated at the beginning of the study and titrated to treat sleep-disordered breathing.  MEDICATIONS Medications self-administered by patient taken the night of the study : CARBIDOPA-LEVODOPA  TECHNICIAN COMMENTS Comments added by technician:  increased leak noted during REM Patient talked in her sleep.   RESPIRATORY PARAMETERS Optimal PAP Pressure (cm): 9 AHI at Optimal Pressure (/hr): 8.0 Overall Minimal O2 (%): 87.00 Supine % at Optimal Pressure (%): 100 Minimal O2 at Optimal Pressure (%): 87.0     SLEEP ARCHITECTURE The study was initiated at 11:30:56 PM and ended at 5:43:28 AM.  Sleep onset time was 17.4 minutes and the sleep efficiency was 81.2%. The total sleep time was 302.5 minutes.  The patient spent 15.70% of the night in stage N1 sleep, 65.95% in stage N2 sleep, 0.66% in stage N3 and 17.69% in REM.Stage REM latency was 77.5 minutes  Wake after sleep onset was  52.6. Alpha intrusion was absent. Supine sleep was 100.00%.  CARDIAC DATA The 2 lead EKG demonstrated sinus rhythm. The mean heart rate was 73.10 beats per minute. Other EKG findings include: None.   LEG MOVEMENT DATA The total Periodic Limb Movements of Sleep (PLMS) were 0. The PLMS index was 0.00. A PLMS index of <15 is considered normal in adults.  IMPRESSIONS - The optimal PAP pressure was 9 cm of water. - Central sleep apnea was not noted during this titration (CAI = 2.0/h). - Mild oxygen desaturations were observed during this titration (min O2 = 87.00%). - The patient snored with Moderate snoring volume during this titration study. - No cardiac abnormalities were observed during this study. - Clinically significant periodic limb movements were not noted during this study. Arousals associated with PLMs were rare.   DIAGNOSIS - Obstructive Sleep Apnea (327.23 [G47.33 ICD-10])    RECOMMENDATIONS - Trial of CPAP therapy on 9 cm H2O with a Medium size Resmed Nasal Pillow Mask AirFit P10 mask and heated humidification. - Avoid alcohol, sedatives and other CNS depressants that may worsen sleep apnea and disrupt normal sleep architecture. - Sleep hygiene should be reviewed to assess factors that may improve sleep quality. - Weight management and regular exercise should be initiated or continued. - Return to [facility] for re-evaluation after 4 weeks of therapy    Kara Mead MD Board Certified in [place]

## 2016-04-16 NOTE — Telephone Encounter (Signed)
Order has been placed.

## 2016-04-16 NOTE — Telephone Encounter (Signed)
Please send prescription to DME for  CPAP  9 cm H2O with a Medium size Resmed Nasal Pillow Mask AirFit P10 mask and heated humidification -Download in 4 weeks  Office visit with me/TP in 6 weeks

## 2016-04-22 ENCOUNTER — Telehealth: Payer: Self-pay | Admitting: Pulmonary Disease

## 2016-04-22 ENCOUNTER — Telehealth: Payer: Self-pay | Admitting: Internal Medicine

## 2016-04-22 DIAGNOSIS — G4733 Obstructive sleep apnea (adult) (pediatric): Secondary | ICD-10-CM

## 2016-04-22 NOTE — Telephone Encounter (Signed)
Please send to a different DME

## 2016-04-22 NOTE — Telephone Encounter (Signed)
I just spoke with Nira Conn from [facility] about Mrs. Range. She stated that the 2015 Sleep Study is too old and the patient needs to have another HST or in lab sleep study per Medicare guidelines the patient should be setup with CPAP within 12 months of the sleep study. After the sleep study the patient will need a follow up visit with the notes stating that the other Sleep study was too old and had to do new sleep study to verify OSA

## 2016-04-23 NOTE — Telephone Encounter (Signed)
Tried to call [employer] multiple times regarding patient's appeal but could not get to a representative.  Called patent and told her I would try them again first thing in the morning and call her back.  Patient agreed

## 2016-04-23 NOTE — Telephone Encounter (Signed)
Called [facility], spoke with Corene Cornea, states that the patient will need a new sleep study per Medicare The one from 2015 is too old. Medicare requires the CPAP to be set within 6 months of the sleep study.  Please advise Dr Elsworth Soho. Thanks.

## 2016-04-23 NOTE — Telephone Encounter (Signed)
Per [place], CB is [phone], states the patient will need a new sleep study per Medicare.

## 2016-04-23 NOTE — Telephone Encounter (Signed)
I have sent this order to [facility] per Dr. Bari Mantis request to send to another DME

## 2016-04-24 ENCOUNTER — Ambulatory Visit: Payer: Medicare Other | Admitting: Physical Therapy

## 2016-04-24 ENCOUNTER — Encounter: Payer: Self-pay | Admitting: Physical Therapy

## 2016-04-24 DIAGNOSIS — H8112 Benign paroxysmal vertigo, left ear: Secondary | ICD-10-CM

## 2016-04-24 DIAGNOSIS — R42 Dizziness and giddiness: Secondary | ICD-10-CM | POA: Diagnosis not present

## 2016-04-24 DIAGNOSIS — R2681 Unsteadiness on feet: Secondary | ICD-10-CM

## 2016-04-24 NOTE — Therapy (Signed)
[facility] [address] [place], [place], [address] Phone: [phone]   Fax:  [phone]  Physical Therapy Treatment  Patient Details  Name: Destiny Roth MRN: [id number] Date of Birth: Feb 29, 1950 Referring Provider: Golden Circle, FNP  Encounter Date: 04/24/2016      PT End of Session - 04/24/16 1243    Visit Number 6   Number of Visits 7   Date for PT Re-Evaluation 05/02/16   Authorization Type Medicare Part A and B-G code and progress note every 10th visit   PT Start Time 1151   PT Stop Time 1230   PT Time Calculation (min) 39 min   Activity Tolerance Patient tolerated treatment well   Behavior During Therapy [facility] for tasks assessed/performed      Past Medical History:  Diagnosis Date  . Abdominal pain, epigastric 09/18/2009  . Anosmia 11/06/2010  . ANXIETY 09/23/2006  . BUNION, RIGHT FOOT 09/18/2009  . CARPAL TUNNEL SYNDROME, BILATERAL 09/23/2006  . DEPRESSION 09/23/2006  . DIVERTICULOSIS, COLON 09/23/2006  . GERD 05/24/2008  . GLUCOSE INTOLERANCE 05/24/2008  . Hemorrhoids   . HYPERLIPIDEMIA 09/23/2006  . HYPERTENSION 09/23/2006  . IBS 09/23/2006  . Impaired glucose tolerance 11/04/2010  . OSTEOPOROSIS 05/24/2008  . Parkinson's disease ([place])   . Restrictive lung disease 12/20/2013    Past Surgical History:  Procedure Laterality Date  . CARDIAC CATHETERIZATION    . CATARACT EXTRACTION    . COLONOSCOPY W/ BIOPSIES  2008   Dr. Collene Mares -negative random bxs  . ESOPHAGOGASTRODUODENOSCOPY  2008   Dr. Harriette Bouillon duodenal ulcer  . FOOT SURGERY Bilateral 2015  . LEFT AND RIGHT HEART CATHETERIZATION WITH CORONARY ANGIOGRAM N/A 11/09/2013   Procedure: LEFT AND RIGHT HEART CATHETERIZATION WITH CORONARY ANGIOGRAM;  Surgeon: Peter M Martinique, MD;  Location: [facility] CATH LAB;  Service: Cardiovascular;  Laterality: N/A;  . nasal septum repair    . s/p bilat CTS    . s/p fibroid ablation    . s/p ganglion cyst right  wrist    . s/p right thumb trigger finger surgury    . SHOULDER SURGERY Right 2015  . TUBAL LIGATION      There were no vitals filed for this visit.      Subjective Assessment - 04/24/16 1155    Subjective Had a good trip but tried yoga with family member and became very dizzy.  Was not able to perform HEP while away.  Still reporting dizziness when ambulating, especially when ambulating outside or upstairs.  [place] but was very unsteady and had to hold her husband to keep from falling.   Patient is accompained by: Family member   Pertinent History Parkinson's   Limitations Walking   Patient Stated Goals To get rid of the dizziness and buzzing in ears   Currently in Pain? No/denies           Vestibular Assessment - 04/24/16 1204      Positional Testing   Dix-Hallpike Dix-Hallpike Right;Dix-Hallpike Left     Dix-Hallpike Right   Dix-Hallpike Right Duration 0   Dix-Hallpike Right Symptoms No nystagmus     Dix-Hallpike Left   Dix-Hallpike Left Duration 5 seconds   Dix-Hallpike Left Symptoms Upbeat, left rotatory nystagmus     Horizontal Canal Right   Horizontal Canal Right Duration 0   Horizontal Canal Right Symptoms Normal     Horizontal Canal Left   Horizontal Canal Left Duration 0   Horizontal Canal  Left Symptoms Normal;Other (comment)  mild ageotropic but subsided and no vertigo reported           Vestibular Treatment/Exercise - 04/24/16 1220      Vestibular Treatment/Exercise   Vestibular Treatment Provided Canalith Repositioning;Gaze   Canalith Repositioning Epley Manuever Left   Gaze Exercises X1 Viewing Horizontal;X1 Viewing Vertical      EPLEY MANUEVER LEFT   Number of Reps  1   Overall Response  Improved Symptoms     X1 Viewing Horizontal   Foot Position seated and then standing with feet together   Reps 3   Comments 30 seconds, verbal cues for technique     X1 Viewing Vertical   Foot Position feet together in standing    Reps 2   Comments 30 seconds, verbal cues for technique               PT Education - 04/24/16 1243    Education provided Yes   Education Details reviewed HEP   Person(s) Educated Patient   Methods Explanation;Demonstration   Comprehension Verbalized understanding;Returned demonstration             PT Long Term Goals - 03/18/16 1349      PT LONG TERM GOAL #1   Title (TARGET DATE FOR ALL LTG 05/02/2016) Will report resolution of vertigo during positional testing   Baseline L BPPV   Time 6   Period Weeks   Status New     PT LONG TERM GOAL #2   Title Pt will be independent with vestibular and balance HEP   Time 6   Period Weeks   Status New     PT LONG TERM GOAL #3   Title Will participate in DVA and other testing for hypofunction with LTG to be set   Time 6   Period Weeks     PT LONG TERM GOAL #4   Title Will report 17 point improvement on [place] for improved function overall   Baseline 68   Time 6   Period Weeks   Status New               Plan - 04/24/16 1244    Clinical Impression Statement Treatment session focused on re-assessment of all canals due to pt returning from trip and continuing to report symptoms.  Today pt found to only present with L posterior canal canalithiasis, treated x 1 with improvement in symptoms.  Reviewed HEP for habituation and adaptation.  Pt is demonstrating progress towards LTG with decreased canal involvement, increased head movement in standing and with gait (less en bloc movement).  Will continue to address impaired balance and gaze stability.     Clinical Impairments Affecting Rehab Potential Parkinson's disease   PT Treatment/Interventions ADLs/Self Care Home Management;Canalith Repostioning;Gait training;Functional mobility training;Therapeutic activities;Therapeutic exercise;Balance training;Neuromuscular re-education;Patient/family education;Vestibular   PT Next Visit Plan Re-assess LTG and recertify if needed for a  few more visits    PT Home Exercise Plan x1 viewing, habituation   Consulted and Agree with Plan of Care Patient      Patient will benefit from skilled therapeutic intervention in order to improve the following deficits and impairments:  Decreased balance, Dizziness, Difficulty walking  Visit Diagnosis: BPPV (benign paroxysmal positional vertigo), left  Dizziness and giddiness  Unsteadiness on feet     Problem List Patient Active Problem List   Diagnosis Date Noted  . Vertigo 03/12/2016  . Chest congestion 02/24/2015  . Peripheral edema 07/09/2014  . Restrictive lung  disease 12/20/2013  . DOE (dyspnea on exertion) 11/18/2013  . Hot flashes 11/18/2013  . Chest pain 08/04/2013  . Memory impairment 08/04/2013  . Orthostasis 02/26/2013  . Tachycardia 02/24/2013  . Coronary artery calcification seen on CAT scan 01/01/2013  . Tendonitis, calcific, shoulder 01/01/2013  . OSA (obstructive sleep apnea) 01/01/2013  . PD (Parkinson's disease) ([place]) 11/26/2011  . Hematochezia 11/09/2011  . Colon polyps 11/09/2011  . IBS (irritable bowel syndrome) 11/09/2011  . Anosmia 11/06/2010  . Impaired glucose tolerance 11/04/2010  . Hearing loss 05/07/2010  . Preventative health care 05/07/2010  . BUNION, RIGHT FOOT 09/18/2009  . GERD 05/24/2008  . OSTEOPOROSIS 05/24/2008  . Hyperlipidemia 09/23/2006  . Anxiety state 09/23/2006  . Depression 09/23/2006  . CARPAL TUNNEL SYNDROME, BILATERAL 09/23/2006  . Essential hypertension 09/23/2006  . DIVERTICULOSIS, COLON 09/23/2006   Raylene Everts, PT, DPT 04/24/16    12:52 PM    [facility] [address] [place], [place], [address] Phone: [phone]   Fax:  [phone]  Name: Destiny Roth MRN: [id number] Date of Birth: [birth year]

## 2016-04-24 NOTE — Telephone Encounter (Signed)
Please let patient know Home sleep study can be obtained

## 2016-04-24 NOTE — Telephone Encounter (Signed)
Order has been placed.

## 2016-04-25 NOTE — Telephone Encounter (Signed)
Still could not get through to [employer].  Called pharmacy and priced and then called in the Anusol cream, which is $4 a tube.  Spoke to patient and told her she could get the cream and put it on a glycerin suppository and use it that way.  Patient is going to try this until I hear about her appeal.

## 2016-04-25 NOTE — Telephone Encounter (Signed)
Pt is wanting to follow up about Hydrocortisone suppository med. Best call back # is [phone].

## 2016-04-26 DIAGNOSIS — H04123 Dry eye syndrome of bilateral lacrimal glands: Secondary | ICD-10-CM | POA: Diagnosis not present

## 2016-04-26 DIAGNOSIS — H26493 Other secondary cataract, bilateral: Secondary | ICD-10-CM | POA: Diagnosis not present

## 2016-04-29 ENCOUNTER — Telehealth: Payer: Self-pay | Admitting: Pulmonary Disease

## 2016-04-29 NOTE — Telephone Encounter (Signed)
Spoke with Nira Conn at [facility], needs a face to face visit before her home sleep test. OV note must discuss that pt is getting a new sleep study to assess OSA.      lmtcb X1 for pt to schedule ov with RA- RA has several openings this week.

## 2016-05-01 ENCOUNTER — Encounter: Payer: Self-pay | Admitting: Physical Therapy

## 2016-05-01 ENCOUNTER — Other Ambulatory Visit: Payer: Self-pay | Admitting: Internal Medicine

## 2016-05-01 ENCOUNTER — Encounter: Payer: Self-pay | Admitting: Pulmonary Disease

## 2016-05-01 ENCOUNTER — Ambulatory Visit: Payer: Medicare Other | Attending: Family | Admitting: Physical Therapy

## 2016-05-01 ENCOUNTER — Ambulatory Visit (INDEPENDENT_AMBULATORY_CARE_PROVIDER_SITE_OTHER): Payer: Medicare Other | Admitting: Pulmonary Disease

## 2016-05-01 VITALS — BP 118/78 | HR 82 | Ht <= 58 in | Wt 140.0 lb

## 2016-05-01 DIAGNOSIS — G20 Parkinson's disease: Secondary | ICD-10-CM

## 2016-05-01 DIAGNOSIS — R2681 Unsteadiness on feet: Secondary | ICD-10-CM | POA: Insufficient documentation

## 2016-05-01 DIAGNOSIS — G4733 Obstructive sleep apnea (adult) (pediatric): Secondary | ICD-10-CM | POA: Diagnosis not present

## 2016-05-01 DIAGNOSIS — H8112 Benign paroxysmal vertigo, left ear: Secondary | ICD-10-CM | POA: Insufficient documentation

## 2016-05-01 DIAGNOSIS — R42 Dizziness and giddiness: Secondary | ICD-10-CM | POA: Diagnosis not present

## 2016-05-01 NOTE — Patient Instructions (Signed)
(  Schedule nocturnal polysomnogram at [facility] Next visit can be after 30 days of starting CPAP therapy  Discussed with neurologist about decreasing dose of Requip

## 2016-05-01 NOTE — Assessment & Plan Note (Signed)
Discussed with neurologist about decreasing dose of Requip- I think this may be contributing to her fatigue

## 2016-05-01 NOTE — Therapy (Signed)
[facility] [address] [place], [place], [address] Phone: [phone]   Fax:  [phone]  Physical Therapy Treatment  Patient Details  Name: Destiny Roth MRN: [id number] Date of Birth: Jul 26, 1950 Referring Provider: Golden Circle, FNP  Encounter Date: 05/01/2016      PT End of Session - 05/01/16 1240    Visit Number 7   Number of Visits 12  7 + 5 per recertification   Date for PT Re-Evaluation 83/41/96  per recertification   Authorization Type Medicare Part A and B-G code and progress note every 10th visit   PT Start Time 1147   PT Stop Time 1231   PT Time Calculation (min) 44 min   Activity Tolerance Patient tolerated treatment well   Behavior During Therapy [facility] for tasks assessed/performed      Past Medical History:  Diagnosis Date  . Abdominal pain, epigastric 09/18/2009  . Anosmia 11/06/2010  . ANXIETY 09/23/2006  . BUNION, RIGHT FOOT 09/18/2009  . CARPAL TUNNEL SYNDROME, BILATERAL 09/23/2006  . DEPRESSION 09/23/2006  . DIVERTICULOSIS, COLON 09/23/2006  . GERD 05/24/2008  . GLUCOSE INTOLERANCE 05/24/2008  . Hemorrhoids   . HYPERLIPIDEMIA 09/23/2006  . HYPERTENSION 09/23/2006  . IBS 09/23/2006  . Impaired glucose tolerance 11/04/2010  . OSTEOPOROSIS 05/24/2008  . Parkinson's disease ([place])   . Restrictive lung disease 12/20/2013    Past Surgical History:  Procedure Laterality Date  . CARDIAC CATHETERIZATION    . CATARACT EXTRACTION    . COLONOSCOPY W/ BIOPSIES  2008   Dr. Collene Mares -negative random bxs  . ESOPHAGOGASTRODUODENOSCOPY  2008   Dr. Harriette Bouillon duodenal ulcer  . FOOT SURGERY Bilateral 2015  . LEFT AND RIGHT HEART CATHETERIZATION WITH CORONARY ANGIOGRAM N/A 11/09/2013   Procedure: LEFT AND RIGHT HEART CATHETERIZATION WITH CORONARY ANGIOGRAM;  Surgeon: Peter M Martinique, MD;  Location: [facility] C[facility] LAB;  Service: Cardiovascular;  Laterality: N/A;  . nasal septum repair    . s/p bilat CTS    . s/p  fibroid ablation    . s/p ganglion cyst right wrist    . s/p right thumb trigger finger surgury    . SHOULDER SURGERY Right 2015  . TUBAL LIGATION      There were no vitals filed for this visit.      Subjective Assessment - 05/01/16 1154    Subjective Had eye appointment with [facility] this past Friday and they found scarring on lens; plans to have a laser procedure next Friday to put a hole in the scar to allow more light to the eye.  Still is having ringing in her ears.  Tried to wear heels over the weekend and did lose her balance but did not fall.    Patient is accompained by: Family member   Pertinent History Parkinson's   Limitations Walking   Patient Stated Goals To get rid of the dizziness and buzzing in ears   Currently in Pain? No/denies            [facility] PT Assessment - 05/01/16 1222      Functional Gait  Assessment   Gait assessed  Yes   Gait Level Surface Walks 20 ft in less than 5.5 sec, no assistive devices, good speed, no evidence for imbalance, normal gait pattern, deviates no more than 6 in outside of the 12 in walkway width.   Change in Gait Speed Able to change speed, demonstrates mild gait deviations, deviates 6-10 in outside of the 12 in walkway  width, or no gait deviations, unable to achieve a major change in velocity, or uses a change in velocity, or uses an assistive device.   Gait with Horizontal Head Turns Performs head turns smoothly with slight change in gait velocity (eg, minor disruption to smooth gait path), deviates 6-10 in outside 12 in walkway width, or uses an assistive device.   Gait with Vertical Head Turns Performs task with slight change in gait velocity (eg, minor disruption to smooth gait path), deviates 6 - 10 in outside 12 in walkway width or uses assistive device   Gait and Pivot Turn Pivot turns safely within 3 sec and stops quickly with no loss of balance.   Step Over Obstacle Is able to step over 2 stacked shoe boxes taped together (9 in  total height) without changing gait speed. No evidence of imbalance.   Gait with Narrow Base of Support Ambulates 4-7 steps.   Gait with Eyes Closed Cannot walk 20 ft without assistance, severe gait deviations or imbalance, deviates greater than 15 in outside 12 in walkway width or will not attempt task.   Ambulating Backwards Walks 20 ft, no assistive devices, good speed, no evidence for imbalance, normal gait   Steps Alternating feet, no rail.   Total Score 22            Vestibular Assessment - 05/01/16 1206      Visual Acuity   Static 5   Dynamic 3; 2 line difference-WFL     Positional Testing   Dix-Hallpike Dix-Hallpike Right;Dix-Hallpike Left   Horizontal Canal Testing Horizontal Canal Right;Horizontal Canal Left     Dix-Hallpike Right   Dix-Hallpike Right Duration 0   Dix-Hallpike Right Symptoms No nystagmus     Dix-Hallpike Left   Dix-Hallpike Left Duration 15 seconds   Dix-Hallpike Left Symptoms Downbeat, right rotatory nystagmus  2nd repetition no nystagmus and no vertigo     Horizontal Canal Right   Horizontal Canal Right Duration 0   Horizontal Canal Right Symptoms Normal     Horizontal Canal Left   Horizontal Canal Left Duration 0   Horizontal Canal Left Symptoms Normal                         PT Education - 05/01/16 1239    Education provided Yes   Education Details Progress, goals met, areas of continued impairment to be address with re-cert; POC and scheduling   Person(s) Educated Patient   Methods Explanation   Comprehension Verbalized understanding             PT Long Term Goals - 05/01/16 1242      PT LONG TERM GOAL #1   Title (TARGET DATE FOR ALL LTG 05/02/2016; NEW TARGET DATE FOR REMAINING LTG 6/76/19 PER RECERTIFICATION) Will report resolution of vertigo during positional testing   Baseline all canals cleared on 05/01/16   Status Achieved     PT LONG TERM GOAL #2   Title Pt will be independent with vestibular and  balance HEP   Time 6   Period Weeks   Status On-going     PT LONG TERM GOAL #3   Title Will participate in DVA and other testing for hypofunction with LTG to be set   Baseline initially 3 line difference; 2 line difference on 05/01/16-WFL   Status Achieved     PT LONG TERM GOAL #4   Title Will report 17 point improvement on [place] for improved function overall  Baseline 68   Time 6   Period Weeks   Status On-going     PT LONG TERM GOAL #5   Title Pt will demonstrate decreased falls risk and improved balance as indicated by improvement in FGA score to >25/30   Baseline 22/30 on 05/01/16   Time 6   Period Weeks   Status New               Plan - 05/01/16 1742    Clinical Impression Statement Treatment session today focused on re-assessment of LTG and balance, gait and falls risk with FGA.  Pt met 2/4 LTG.  Pt is making good progress and demonstrates resolution of BPPV of all canals involved and improvements in gaze stability as indicated by 2 line difference on DVA.  Per pt's FGA score of 22/30 pt is at increased risk for falls and shows decreased use of sensory and vestibular systems for balance.  Pt would benefit from continued PT after her eye surgery to continue to address impairments to further decrease falls risk and improve safety with functional mobility.   Rehab Potential Good   Clinical Impairments Affecting Rehab Potential Parkinson's disease   PT Frequency 1x / week   PT Duration 6 weeks   PT Treatment/Interventions ADLs/Self Care Home Management;Canalith Repostioning;Gait training;Functional mobility training;Therapeutic activities;Therapeutic exercise;Balance training;Neuromuscular re-education;Patient/family education;Vestibular   PT Next Visit Plan SOT assessment when pt returns from eye surgery   PT Home Exercise Plan progress corner balance   Consulted and Agree with Plan of Care Patient      Patient will benefit from skilled therapeutic intervention in order to  improve the following deficits and impairments:  Decreased balance, Dizziness, Difficulty walking  Visit Diagnosis: BPPV (benign paroxysmal positional vertigo), left  Dizziness and giddiness  Unsteadiness on feet     Problem List Patient Active Problem List   Diagnosis Date Noted  . Vertigo 03/12/2016  . Chest congestion 02/24/2015  . Peripheral edema 07/09/2014  . Restrictive lung disease 12/20/2013  . DOE (dyspnea on exertion) 11/18/2013  . Hot flashes 11/18/2013  . Chest pain 08/04/2013  . Memory impairment 08/04/2013  . Orthostasis 02/26/2013  . Tachycardia 02/24/2013  . Coronary artery calcification seen on CAT scan 01/01/2013  . Tendonitis, calcific, shoulder 01/01/2013  . OSA (obstructive sleep apnea) 01/01/2013  . PD (Parkinson's disease) ([place]) 11/26/2011  . Hematochezia 11/09/2011  . Colon polyps 11/09/2011  . IBS (irritable bowel syndrome) 11/09/2011  . Anosmia 11/06/2010  . Impaired glucose tolerance 11/04/2010  . Hearing loss 05/07/2010  . Preventative health care 05/07/2010  . BUNION, RIGHT FOOT 09/18/2009  . GERD 05/24/2008  . OSTEOPOROSIS 05/24/2008  . Hyperlipidemia 09/23/2006  . Anxiety state 09/23/2006  . Depression 09/23/2006  . CARPAL TUNNEL SYNDROME, BILATERAL 09/23/2006  . Essential hypertension 09/23/2006  . DIVERTICULOSIS, COLON 09/23/2006   Raylene Everts, PT, DPT 05/01/16    5:46 PM    [facility] [address] [place], [place], [address] Phone: [phone]   Fax:  [phone]  Name: Destiny Roth MRN: [id number] Date of Birth: 03-16-1950

## 2016-05-01 NOTE — Progress Notes (Signed)
   Subjective:    Patient ID: Destiny Roth, female    DOB: [birth year], 66 y.o.   MRN: [id number]  HPI   66 year old woman for evaluation of dyspnea and mild OSA She was diagnosed with Parkinson's 2013 and has been maintained on Mirapex . Leg jerks improved after increasing dose of Mirapex. She has a history of vivid dreams and screaming and talking during sleep.   Chief Complaint  Patient presents with  . Sleep Study    Per [facility], she needs to have another sleep study done. Has already had a CPAP titration.      Follow-up after 3 years. She has now been diagnosed with Parkinson's for 4-1/2 years, has been to [facility], Sinemet was increased and then decreased again due to side effects. Ropinirole has been added at a high-dose. She currently continues to have extreme daytime fatigue. On her last visit we discussed trial of CPAP therapy to see if this would help with her fatigue. She does CPAP titration study done and prescription was sent needed Unfortunately DME said that her original polysomnogram was too long ago-2015 and also requested a face-to-face visit.  He had a long discussion today about her Parkinson's meds, chronic fatigue, and her sleep habits. Her dyspnea is much improved now   Significant tests/ events  Cath - non obstructive CAD, normal LV fn CT chest without contrast nad PFTs 10/2013  no airway obstruction, FVC 90%, TLC decreased at 70%, DLCO normal ABG - PO2 71 on room air  PSG 2015 showed mild OSA - AHI 12/h- No evidence of increased muscle tone during REM sleep, REM AHI was 36/h. There were 58 RERAs with an RDI of 23 events per hour    Review of Systems neg for any significant sore throat, dysphagia, itching, sneezing, nasal congestion or excess/ purulent secretions, fever, chills, sweats, unintended wt loss, pleuritic or exertional cp, hempoptysis, orthopnea pnd or change in chronic leg swelling. Also denies presyncope, palpitations, heartburn,  abdominal pain, nausea, vomiting, diarrhea or change in bowel or urinary habits, dysuria,hematuria, rash, arthralgias, visual complaints, headache, numbness weakness or ataxia.     Objective:   Physical Exam   Gen. Pleasant, well-nourished, in no distress, flat affect ENT - , no post nasal drip Neck: No JVD, no thyromegaly, no carotid bruits Lungs: no use of accessory muscles, no dullness to percussion, clear without rales or rhonchi  Cardiovascular: Rhythm regular, heart sounds  normal, no murmurs or gallops, no peripheral edema Musculoskeletal: No deformities, no cyanosis or clubbing         Assessment & Plan:

## 2016-05-01 NOTE — Assessment & Plan Note (Signed)
Schedule nocturnal polysomnogram at [facility] Next visit can be after 30 days of starting CPAP therapy  Note that this is being done because DME finds earlier sleep study from 2015 unacceptable

## 2016-05-08 ENCOUNTER — Encounter: Payer: Medicare Other | Admitting: Physical Therapy

## 2016-05-10 DIAGNOSIS — H26491 Other secondary cataract, right eye: Secondary | ICD-10-CM | POA: Diagnosis not present

## 2016-05-10 DIAGNOSIS — H26493 Other secondary cataract, bilateral: Secondary | ICD-10-CM | POA: Diagnosis not present

## 2016-05-10 DIAGNOSIS — H26492 Other secondary cataract, left eye: Secondary | ICD-10-CM | POA: Diagnosis not present

## 2016-05-14 ENCOUNTER — Ambulatory Visit (HOSPITAL_BASED_OUTPATIENT_CLINIC_OR_DEPARTMENT_OTHER): Payer: Medicare Other | Attending: Pulmonary Disease | Admitting: Pulmonary Disease

## 2016-05-14 VITALS — Ht <= 58 in | Wt 140.0 lb

## 2016-05-14 DIAGNOSIS — G4733 Obstructive sleep apnea (adult) (pediatric): Secondary | ICD-10-CM

## 2016-05-15 ENCOUNTER — Encounter: Payer: Self-pay | Admitting: Physical Therapy

## 2016-05-15 ENCOUNTER — Ambulatory Visit: Payer: Medicare Other | Admitting: Physical Therapy

## 2016-05-15 DIAGNOSIS — R2681 Unsteadiness on feet: Secondary | ICD-10-CM | POA: Diagnosis not present

## 2016-05-15 DIAGNOSIS — H8112 Benign paroxysmal vertigo, left ear: Secondary | ICD-10-CM

## 2016-05-15 DIAGNOSIS — R42 Dizziness and giddiness: Secondary | ICD-10-CM | POA: Diagnosis not present

## 2016-05-15 NOTE — Therapy (Signed)
[facility] [address] [place], [place], [address] Phone: [phone]   Fax:  [phone]  Physical Therapy Treatment  Patient Details  Name: Destiny Roth MRN: [id number] Date of Birth: 07-Nov-1950 Referring Provider: Golden Circle, FNP  Encounter Date: 05/15/2016      PT End of Session - 05/15/16 1253    Visit Number 8   Number of Visits 12  7 + 5 per recertification   Date for PT Re-Evaluation 17/61/60  per recertification   Authorization Type Medicare Part A and B-G code and progress note every 10th visit   PT Start Time 1018   PT Stop Time 1105   PT Time Calculation (min) 47 min   Activity Tolerance Patient tolerated treatment well   Behavior During Therapy [facility] for tasks assessed/performed      Past Medical History:  Diagnosis Date  . Abdominal pain, epigastric 09/18/2009  . Anosmia 11/06/2010  . ANXIETY 09/23/2006  . BUNION, RIGHT FOOT 09/18/2009  . CARPAL TUNNEL SYNDROME, BILATERAL 09/23/2006  . DEPRESSION 09/23/2006  . DIVERTICULOSIS, COLON 09/23/2006  . GERD 05/24/2008  . GLUCOSE INTOLERANCE 05/24/2008  . Hemorrhoids   . HYPERLIPIDEMIA 09/23/2006  . HYPERTENSION 09/23/2006  . IBS 09/23/2006  . Impaired glucose tolerance 11/04/2010  . OSTEOPOROSIS 05/24/2008  . Parkinson's disease ([place])   . Restrictive lung disease 12/20/2013    Past Surgical History:  Procedure Laterality Date  . CARDIAC CATHETERIZATION    . CATARACT EXTRACTION    . COLONOSCOPY W/ BIOPSIES  2008   Dr. Collene Mares -negative random bxs  . ESOPHAGOGASTRODUODENOSCOPY  2008   Dr. Harriette Bouillon duodenal ulcer  . FOOT SURGERY Bilateral 2015  . LEFT AND RIGHT HEART CATHETERIZATION WITH CORONARY ANGIOGRAM N/A 11/09/2013   Procedure: LEFT AND RIGHT HEART CATHETERIZATION WITH CORONARY ANGIOGRAM;  Surgeon: Peter M Martinique, MD;  Location: [facility] CATH LAB;  Service: Cardiovascular;  Laterality: N/A;  . nasal septum repair    . s/p bilat CTS    . s/p  fibroid ablation    . s/p ganglion cyst right wrist    . s/p right thumb trigger finger surgury    . SHOULDER SURGERY Right 2015  . TUBAL LIGATION      There were no vitals filed for this visit.      Subjective Assessment - 05/15/16 1026    Subjective Had surgery in eye and sleep study last night; vision is better but still experiencing disequilibrium.     Patient is accompained by: Family member   Pertinent History Parkinson's   Limitations Walking   Patient Stated Goals To get rid of the dizziness and buzzing in ears   Currently in Pain? No/denies           [facility] Adult PT Treatment/Exercise - 05/15/16 1027      Standardized Balance Assessment   Standardized Balance Assessment Balance Master Testing  SOT composite score: 57    Sensory Organization Testing=57% compared to age/height normative value of 70%.   Patient demonstrated >90% use of somatosensory feedback for balance, >80% use of visual feedback for balance, and 24% use of vestibular feedback for balance (below average for age; [facility] = >50% use).              PT Education - 05/15/16 1300    Education provided Yes   Education Details SOT composite score and decreased use of vestibular system; discussed specific ways to stimulate use of vestibular system   Person(s) Educated Patient  Methods Explanation   Comprehension Verbalized understanding             PT Long Term Goals - 05/01/16 1242      PT LONG TERM GOAL #1   Title (TARGET DATE FOR ALL LTG 05/02/2016; NEW TARGET DATE FOR REMAINING LTG 2/99/37 PER RECERTIFICATION) Will report resolution of vertigo during positional testing   Baseline all canals cleared on 05/01/16   Status Achieved     PT LONG TERM GOAL #2   Title Pt will be independent with vestibular and balance HEP   Time 6   Period Weeks   Status On-going     PT LONG TERM GOAL #3   Title Will participate in DVA and other testing for hypofunction with LTG to be set   Baseline initially  3 line difference; 2 line difference on 05/01/16-WFL   Status Achieved     PT LONG TERM GOAL #4   Title Will report 17 point improvement on [place] for improved function overall   Baseline 68   Time 6   Period Weeks   Status On-going     PT LONG TERM GOAL #5   Title Pt will demonstrate decreased falls risk and improved balance as indicated by improvement in FGA score to >25/30   Baseline 22/30 on 05/01/16   Time 6   Period Weeks   Status New               Plan - 05/15/16 1254    Clinical Impression Statement Treatment session today with continued focus on assessment of patient's use of all balance sensory systems with SOT testing.  Pt demonstrates WFL use of somatosensory and visual input but below average use of vestibular system. Educated pt on findings and plan to add to HEP exercises to promote increased use of vestibular system.     Rehab Potential Good   Clinical Impairments Affecting Rehab Potential Parkinson's disease   PT Treatment/Interventions ADLs/Self Care Home Management;Canalith Repostioning;Gait training;Functional mobility training;Therapeutic activities;Therapeutic exercise;Balance training;Neuromuscular re-education;Patient/family education;Vestibular   PT Next Visit Plan Corner balance exercises-decrease use of vision and somatosensory to promote increased use of vestibular; 10th visit in 2 visits!!     Consulted and Agree with Plan of Care Patient      Patient will benefit from skilled therapeutic intervention in order to improve the following deficits and impairments:  Decreased balance, Dizziness, Difficulty walking  Visit Diagnosis: Dizziness and giddiness  Unsteadiness on feet  BPPV (benign paroxysmal positional vertigo), left     Problem List Patient Active Problem List   Diagnosis Date Noted  . Vertigo 03/12/2016  . Chest congestion 02/24/2015  . Peripheral edema 07/09/2014  . Restrictive lung disease 12/20/2013  . DOE (dyspnea on exertion)  11/18/2013  . Hot flashes 11/18/2013  . Chest pain 08/04/2013  . Memory impairment 08/04/2013  . Orthostasis 02/26/2013  . Tachycardia 02/24/2013  . Coronary artery calcification seen on CAT scan 01/01/2013  . Tendonitis, calcific, shoulder 01/01/2013  . OSA (obstructive sleep apnea) 01/01/2013  . PD (Parkinson's disease) ([place]) 11/26/2011  . Hematochezia 11/09/2011  . Colon polyps 11/09/2011  . IBS (irritable bowel syndrome) 11/09/2011  . Anosmia 11/06/2010  . Impaired glucose tolerance 11/04/2010  . Hearing loss 05/07/2010  . Preventative health care 05/07/2010  . BUNION, RIGHT FOOT 09/18/2009  . GERD 05/24/2008  . OSTEOPOROSIS 05/24/2008  . Hyperlipidemia 09/23/2006  . Anxiety state 09/23/2006  . Depression 09/23/2006  . CARPAL TUNNEL SYNDROME, BILATERAL 09/23/2006  . Essential hypertension  09/23/2006  . DIVERTICULOSIS, COLON 09/23/2006   Raylene Everts, PT, DPT 05/15/16    1:03 PM    [facility] [address] [place], [place], [address] Phone: [phone]   Fax:  [phone]  Name: NKECHI LINEHAN MRN: [id number] Date of Birth: December 07, 1950

## 2016-05-20 NOTE — Telephone Encounter (Signed)
Pt had OV on 05/01/16 with RA. Pt had sleep study on 05/16/16.  Pt is requesting results.   RA please advise. Thanks.

## 2016-05-20 NOTE — Telephone Encounter (Signed)
lmtcb for pt to schedule ov with RA

## 2016-05-20 NOTE — Telephone Encounter (Signed)
Patient returned call, CB is [phone].

## 2016-05-22 DIAGNOSIS — G4733 Obstructive sleep apnea (adult) (pediatric): Secondary | ICD-10-CM | POA: Diagnosis not present

## 2016-05-22 NOTE — Telephone Encounter (Signed)
Sleep study results were discussed with the patient AHI was low, although RDI was marginally high Do not feel that CPAP would benefit her -this was discussed with her in detail

## 2016-05-22 NOTE — Procedures (Signed)
Patient Name: Opel, Lejeune Study Date: 05/14/2016 Gender: Female D.O.B: 10/26/1950 Age (years): 46 Referring Provider: Kara Mead MD, ABSM Height (inches): 57 Interpreting Physician: Kara Mead MD, ABSM Weight (lbs): 140 RPSGT: Laren Everts BMI: 30 MRN: [id number] Neck Size: 15.50   CLINICAL INFORMATION Sleep Study Type: NPSG  Indication for sleep study: Daytime Fatigue, Hypertension, Insomnia, Parasomnias, Snoring, Witnesses Apnea / Gasping During Sleep  Epworth Sleepiness Score: 7   Most recent titration study dated 04/07/2016 was optimal at 9cm H2O with an AHI of 6.7/h. SLEEP STUDY TECHNIQUE As per the AASM Manual for the Scoring of Sleep and Associated Events v2.3 (April 2016) with a hypopnea requiring 4% desaturations.  The channels recorded and monitored were frontal, central and occipital EEG, electrooculogram (EOG), submentalis EMG (chin), nasal and oral airflow, thoracic and abdominal wall motion, anterior tibialis EMG, snore microphone, electrocardiogram, and pulse oximetry.  MEDICATIONS Medications self-administered by patient taken the night of the study : CARBIDOPA-LEVODOPA  SLEEP ARCHITECTURE The study was initiated at 10:42:43 PM and ended at 4:44:56 AM.  Sleep onset time was 7.0 minutes and the sleep efficiency was 53.7%. The total sleep time was 194.5 minutes.  Stage REM was not noted  The patient spent 19.28% of the night in stage N1 sleep, 80.72% in stage N2 sleep, 0.00% in stage N3 and 0.00% in REM.  Alpha intrusion was absent.  Supine sleep was 100.00%.  RESPIRATORY PARAMETERS The overall apnea/hypopnea index (AHI) was 4.3 per hour. There were 13 total apneas, including 13 obstructive, 0 central and 0 mixed apneas. There were 1 hypopneas and 21 RERAs.  AHI while supine was 4.3 per hour.  The mean oxygen saturation was 93.57%. The minimum SpO2 during sleep was 88.00%.  Moderate snoring was noted during this study.  CARDIAC DATA The 2  lead EKG demonstrated sinus rhythm. The mean heart rate was 74.84 beats per minute. Other EKG findings include: None.   LEG MOVEMENT DATA The total PLMS were 0 with a resulting PLMS index of 0.00. Associated arousal with leg movement index was 0.0 .  IMPRESSIONS - Mild obstructive sleep apnea occurred during this study (AHI = 4.3/h, RDI 10.8/h). - No significant central sleep apnea occurred during this study (CAI = 0.0/h). - Mild oxygen desaturation was noted during this study (Min O2 = 88.00%). - The patient snored with Moderate snoring volume. - No cardiac abnormalities were noted during this study. - Clinically significant periodic limb movements did not occur during sleep. No significant associated arousals.   DIAGNOSIS - Mild OSA   RECOMMENDATIONS - No therapy or positional therapy alone. If remains symptomatic, CPAP can be tried - Avoid alcohol, sedatives and other CNS depressants that may worsen sleep apnea and disrupt normal sleep architecture. - Sleep hygiene should be reviewed to assess factors that may improve sleep quality. - Weight management and regular exercise should be initiated or continued if appropriate.   Kara Mead MD Board Certified in [place]

## 2016-05-23 ENCOUNTER — Encounter: Payer: Self-pay | Admitting: Physical Therapy

## 2016-05-23 ENCOUNTER — Ambulatory Visit: Payer: Medicare Other | Admitting: Physical Therapy

## 2016-05-23 DIAGNOSIS — H8112 Benign paroxysmal vertigo, left ear: Secondary | ICD-10-CM | POA: Diagnosis not present

## 2016-05-23 DIAGNOSIS — R42 Dizziness and giddiness: Secondary | ICD-10-CM | POA: Diagnosis not present

## 2016-05-23 DIAGNOSIS — R2681 Unsteadiness on feet: Secondary | ICD-10-CM

## 2016-05-23 NOTE — Therapy (Signed)
[facility] [address] [place], [place], [address] Phone: [phone]   Fax:  [phone]  Physical Therapy Treatment  Patient Details  Name: Destiny Roth MRN: [id number] Date of Birth: 02/22/1950 Referring Provider: Golden Circle, FNP  Encounter Date: 05/23/2016      PT End of Session - 05/23/16 1156    Visit Number 9   Number of Visits 12  7 + 5 per recertification   Date for PT Re-Evaluation 07/21/74  per recertification   Authorization Type Medicare Part A and B-G code and progress note every 10th visit   PT Start Time 1105   PT Stop Time 1149   PT Time Calculation (min) 44 min   Activity Tolerance Patient tolerated treatment well   Behavior During Therapy [facility] for tasks assessed/performed      Past Medical History:  Diagnosis Date  . Abdominal pain, epigastric 09/18/2009  . Anosmia 11/06/2010  . ANXIETY 09/23/2006  . BUNION, RIGHT FOOT 09/18/2009  . CARPAL TUNNEL SYNDROME, BILATERAL 09/23/2006  . DEPRESSION 09/23/2006  . DIVERTICULOSIS, COLON 09/23/2006  . GERD 05/24/2008  . GLUCOSE INTOLERANCE 05/24/2008  . Hemorrhoids   . HYPERLIPIDEMIA 09/23/2006  . HYPERTENSION 09/23/2006  . IBS 09/23/2006  . Impaired glucose tolerance 11/04/2010  . OSTEOPOROSIS 05/24/2008  . Parkinson's disease ([place])   . Restrictive lung disease 12/20/2013    Past Surgical History:  Procedure Laterality Date  . CARDIAC CATHETERIZATION    . CATARACT EXTRACTION    . COLONOSCOPY W/ BIOPSIES  2008   Dr. Collene Mares -negative random bxs  . ESOPHAGOGASTRODUODENOSCOPY  2008   Dr. Harriette Bouillon duodenal ulcer  . FOOT SURGERY Bilateral 2015  . LEFT AND RIGHT HEART CATHETERIZATION WITH CORONARY ANGIOGRAM N/A 11/09/2013   Procedure: LEFT AND RIGHT HEART CATHETERIZATION WITH CORONARY ANGIOGRAM;  Surgeon: Peter M Martinique, MD;  Location: [facility] CATH LAB;  Service: Cardiovascular;  Laterality: N/A;  . nasal septum repair    . s/p bilat CTS    . s/p  fibroid ablation    . s/p ganglion cyst right wrist    . s/p right thumb trigger finger surgury    . SHOULDER SURGERY Right 2015  . TUBAL LIGATION      There were no vitals filed for this visit.      Subjective Assessment - 05/23/16 1109    Subjective Pt reports feeling better overall but still having LOB with quick turns and head turns.  Reports driving for the first time and having to focus when turning her head.   Pertinent History Parkinson's   Limitations Walking   Patient Stated Goals To get rid of the dizziness and buzzing in ears   Currently in Pain? No/denies           Vestibular Treatment/Exercise - 05/23/16 1133      Vestibular Treatment/Exercise   Vestibular Treatment Provided Gaze;Habituation   Habituation Exercises 180 degree Turns   Gaze Exercises X1 Viewing Horizontal;X1 Viewing Vertical     180 degree Turns   Number of Reps  2   Symptom Description  walking forwards, turning  L and R     X1 Viewing Horizontal   Foot Position standing with L and R partial tandem   Reps 2   Comments 30 seconds     X1 Viewing Vertical   Foot Position standing with L and R partial tandem   Reps 2   Comments 30 sec  Balance Exercises - 05/23/16 1132      Balance Exercises: Standing   Standing Eyes Closed Narrow base of support (BOS);Head turns;Foam/compliant surface;Solid surface;Other reps (comment)  R and L partial tandem, 10 reps head nods and turns           PT Education - 05/23/16 1155    Education provided Yes   Education Details Progress and new balance exercises for HEP   Person(s) Educated Patient   Methods Explanation;Demonstration;Handout   Comprehension Verbalized understanding;Returned demonstration             PT Long Term Goals - 05/01/16 1242      PT LONG TERM GOAL #1   Title (TARGET DATE FOR ALL LTG 05/02/2016; NEW TARGET DATE FOR REMAINING LTG 4/31/54 PER RECERTIFICATION) Will report resolution of vertigo during  positional testing   Baseline all canals cleared on 05/01/16   Status Achieved     PT LONG TERM GOAL #2   Title Pt will be independent with vestibular and balance HEP   Time 6   Period Weeks   Status On-going     PT LONG TERM GOAL #3   Title Will participate in DVA and other testing for hypofunction with LTG to be set   Baseline initially 3 line difference; 2 line difference on 05/01/16-WFL   Status Achieved     PT LONG TERM GOAL #4   Title Will report 17 point improvement on [place] for improved function overall   Baseline 68   Time 6   Period Weeks   Status On-going     PT LONG TERM GOAL #5   Title Pt will demonstrate decreased falls risk and improved balance as indicated by improvement in FGA score to >25/30   Baseline 22/30 on 05/01/16   Time 6   Period Weeks   Status New               Plan - 05/23/16 1156    Clinical Impression Statement Pt continues to make good progress with no more reports of vertigo during transitional movements; pt still presents with dynamic balance impairments during head turns and changes in direction.  Focused session today on prgressing HEP and providing pt with balance exercises to stimulate vestibular system.  Pt tolerated well and anticipate pt will be ready to D/C within next two visits.   Rehab Potential Good   Clinical Impairments Affecting Rehab Potential Parkinson's disease   PT Treatment/Interventions ADLs/Self Care Home Management;Canalith Repostioning;Gait training;Functional mobility training;Therapeutic activities;Therapeutic exercise;Balance training;Neuromuscular re-education;Patient/family education;Vestibular   PT Next Visit Plan 10th visit!! G code needed; check HEP and progress as able; focus on 180 turns (ADD RETRO GAIT AND TURNS TO HEP)   Consulted and Agree with Plan of Care Patient      Patient will benefit from skilled therapeutic intervention in order to improve the following deficits and impairments:  Decreased balance,  Dizziness, Difficulty walking  Visit Diagnosis: Dizziness and giddiness  Unsteadiness on feet  BPPV (benign paroxysmal positional vertigo), left     Problem List Patient Active Problem List   Diagnosis Date Noted  . Vertigo 03/12/2016  . Chest congestion 02/24/2015  . Peripheral edema 07/09/2014  . Restrictive lung disease 12/20/2013  . DOE (dyspnea on exertion) 11/18/2013  . Hot flashes 11/18/2013  . Chest pain 08/04/2013  . Memory impairment 08/04/2013  . Orthostasis 02/26/2013  . Tachycardia 02/24/2013  . Coronary artery calcification seen on CAT scan 01/01/2013  . Tendonitis, calcific, shoulder 01/01/2013  .  OSA (obstructive sleep apnea) 01/01/2013  . PD (Parkinson's disease) ([place]) 11/26/2011  . Hematochezia 11/09/2011  . Colon polyps 11/09/2011  . IBS (irritable bowel syndrome) 11/09/2011  . Anosmia 11/06/2010  . Impaired glucose tolerance 11/04/2010  . Hearing loss 05/07/2010  . Preventative health care 05/07/2010  . BUNION, RIGHT FOOT 09/18/2009  . GERD 05/24/2008  . OSTEOPOROSIS 05/24/2008  . Hyperlipidemia 09/23/2006  . Anxiety state 09/23/2006  . Depression 09/23/2006  . CARPAL TUNNEL SYNDROME, BILATERAL 09/23/2006  . Essential hypertension 09/23/2006  . DIVERTICULOSIS, COLON 09/23/2006    Destiny Roth, PT, DPT 05/23/16    12:02 PM    [facility] [address] [place], [place], [address] Phone: [phone]   Fax:  [phone]  Name: Destiny Roth MRN: [id number] Date of Birth: September 02, 1950

## 2016-05-23 NOTE — Telephone Encounter (Signed)
Left detailed message on pts VM. 

## 2016-05-23 NOTE — Patient Instructions (Signed)
Feet Partial Heel-Toe, Head Motion - Eyes Closed   With eyes closed and right foot partially in front of the other, move head slowly, up and down 10 times, side to side 10 times.  Switch and put left foot forward, close eyes, move head up and down 10 times, side to side 10 times. Do 2 sessions per day.   Feet Partial Heel-Toe (Compliant Surface) Head Motion - Eyes Closed    Stand on compliant surface: pillow.  With eyes closed and right foot partially in front of the other, move head slowly, up and down 10 times, side to side 10 times.  Switch and put left foot forward, close eyes, move head up and down 10 times, side to side 10 times. Do 2 sessions per day.  Gaze Stabilization - Tip Card  1.Target must remain in focus, not blurry, and appear stationary while head is in motion. 2.Perform exercises with small head movements (45 to either side of midline). 3.Increase speed of head motion so long as target is in focus. 4.If you wear eyeglasses, be sure you can see target through lens (therapist will give specific instructions for bifocal / progressive lenses). 5.These exercises may provoke dizziness or nausea. Work through these symptoms. If too dizzy, slow head movement slightly. Rest between each exercise. 6.Exercises demand concentration; avoid distractions. 7.For safety, perform standing exercises close to a counter, wall, corner, or next to someone.  Copyright  VHI. All rights reserved.   Gaze Stabilization - Standing Feet Apart   Feet Right foot forward, keeping eyes on target on wall 3 feet away, tilt head down slightly and move head side to side for 30 seconds. Repeat while moving head up and down for 30 seconds.  Switch and put Left foot forwards, repeat head movements. *Work up to tolerating 60 seconds, as able. Do 2-3 sessions per day.  Forward / Backward Progression With 180 (Half) Turns    Walk making a slow half turn in place, leading with head and eyes, toward  left/right every _10___ steps. Alternate walking forward or backward between turns, maintaining a straight path. Repeat sequence _5 times to right, 5 times to left___ times per session. Do __2__ sessions per day. **WALK FORWARDS FOR NOW UNTIL NEXT SESSION AND THEN WILL ADD BACKWARDS**   Copyright  VHI. All rights reserved.

## 2016-05-30 ENCOUNTER — Ambulatory Visit: Payer: Medicare Other | Attending: Family | Admitting: Physical Therapy

## 2016-05-30 ENCOUNTER — Encounter: Payer: Self-pay | Admitting: Physical Therapy

## 2016-05-30 DIAGNOSIS — R42 Dizziness and giddiness: Secondary | ICD-10-CM | POA: Diagnosis not present

## 2016-05-30 DIAGNOSIS — R2681 Unsteadiness on feet: Secondary | ICD-10-CM | POA: Diagnosis not present

## 2016-05-30 DIAGNOSIS — H8112 Benign paroxysmal vertigo, left ear: Secondary | ICD-10-CM

## 2016-05-30 NOTE — Patient Instructions (Signed)
Feet Partial Heel-Toe, Head Motion - Eyes Closed   With eyes closed and right foot partially in front of the other, move head slowly, up and down 10 times, side to side 10 times.  Switch and put left foot forward, close eyes, move head up and down 10 times, side to side 10 times. Do 2 sessions per day.   Feet Partial Heel-Toe (Compliant Surface) Head Motion - Eyes Closed    Stand on compliant surface: pillow.  With eyes closed and right foot partially in front of the other, move head slowly, up and down 10 times, side to side 10 times.  Switch and put left foot forward, close eyes, move head up and down 10 times, side to side 10 times. Do 2 sessions per day.  Gaze Stabilization - Tip Card  1.Target must remain in focus, not blurry, and appear stationary while head is in motion. 2.Perform exercises with small head movements (45 to either side of midline). 3.Increase speed of head motion so long as target is in focus. 4.If you wear eyeglasses, be sure you can see target through lens (therapist will give specific instructions for bifocal / progressive lenses). 5.These exercises may provoke dizziness or nausea. Work through these symptoms. If too dizzy, slow head movement slightly. Rest between each exercise. 6.Exercises demand concentration; avoid distractions. 7.For safety, perform standing exercises close to a counter, wall, corner, or next to someone.  Copyright  VHI. All rights reserved.   Gaze Stabilization - Standing Feet Apart   Feet Right foot forward, keeping eyes on target on wall 3 feet away, tilt head down slightly and move head side to side for 30 seconds. Repeat while moving head up and down for 30 seconds.  Switch and put Left foot forwards, repeat head movements. *Work up to tolerating 60 seconds, as able. Do 2-3 sessions per day.  Forward / Backward Progression With 180 (Half) Turns    Walk making a slow half turn in place, leading with head and eyes, toward  left/right every _10___ steps. Alternate walking forward or backward between turns, maintaining a straight path. Repeat sequence _5 times to right, 5 times to left___ times per session. Do __2__ sessions per day.  FUNCTIONAL MOBILITY: Heel to Toe Walking    Walk forward touching heel of one foot to toes of other, walk forwards then backwards. Hold on to counter top if necessary. _2__ reps per set, _2__ sets per day.   Also, holding on to counter top with left hand-walk forwards and backwards with eyes closed (normal walking)

## 2016-05-30 NOTE — Therapy (Signed)
[facility] [address] [place], [place], [address] Phone: [phone]   Fax:  [phone]  Physical Therapy Treatment  Patient Details  Name: Destiny Roth MRN: [id number] Date of Birth: 02-21-1950 Referring Provider: Golden Circle, FNP  Encounter Date: 05/30/2016      PT End of Session - 05/30/16 1155    Visit Number 10   Number of Visits 12   Date for PT Re-Evaluation 06/15/16   Authorization Type Medicare Part A and B-G code and progress note every 10th visit   PT Start Time 1107   PT Stop Time 1147   PT Time Calculation (min) 40 min   Activity Tolerance Patient tolerated treatment well   Behavior During Therapy [facility] for tasks assessed/performed      Past Medical History:  Diagnosis Date  . Abdominal pain, epigastric 09/18/2009  . Anosmia 11/06/2010  . ANXIETY 09/23/2006  . BUNION, RIGHT FOOT 09/18/2009  . CARPAL TUNNEL SYNDROME, BILATERAL 09/23/2006  . DEPRESSION 09/23/2006  . DIVERTICULOSIS, COLON 09/23/2006  . GERD 05/24/2008  . GLUCOSE INTOLERANCE 05/24/2008  . Hemorrhoids   . HYPERLIPIDEMIA 09/23/2006  . HYPERTENSION 09/23/2006  . IBS 09/23/2006  . Impaired glucose tolerance 11/04/2010  . OSTEOPOROSIS 05/24/2008  . Parkinson's disease ([place])   . Restrictive lung disease 12/20/2013    Past Surgical History:  Procedure Laterality Date  . CARDIAC CATHETERIZATION    . CATARACT EXTRACTION    . COLONOSCOPY W/ BIOPSIES  2008   Dr. Collene Mares -negative random bxs  . ESOPHAGOGASTRODUODENOSCOPY  2008   Dr. Harriette Bouillon duodenal ulcer  . FOOT SURGERY Bilateral 2015  . LEFT AND RIGHT HEART CATHETERIZATION WITH CORONARY ANGIOGRAM N/A 11/09/2013   Procedure: LEFT AND RIGHT HEART CATHETERIZATION WITH CORONARY ANGIOGRAM;  Surgeon: Peter M Martinique, MD;  Location: [facility] CATH LAB;  Service: Cardiovascular;  Laterality: N/A;  . nasal septum repair    . s/p bilat CTS    . s/p fibroid ablation    . s/p ganglion cyst right  wrist    . s/p right thumb trigger finger surgury    . SHOULDER SURGERY Right 2015  . TUBAL LIGATION      There were no vitals filed for this visit.      Subjective Assessment - 05/30/16 1112    Subjective Pt doing well overall and asking if today should be her last day if re-assessing goals.  Asking questions about homeopathic/essential oils for dizziness and tinnitus.     Patient is accompained by: Family member   Pertinent History Parkinson's   Limitations Walking   Patient Stated Goals To get rid of the dizziness and buzzing in ears   Currently in Pain? No/denies            [facility] PT Assessment - 05/30/16 1119      Functional Gait  Assessment   Gait assessed  Yes   Gait Level Surface Walks 20 ft in less than 5.5 sec, no assistive devices, good speed, no evidence for imbalance, normal gait pattern, deviates no more than 6 in outside of the 12 in walkway width.   Change in Gait Speed Able to smoothly change walking speed without loss of balance or gait deviation. Deviate no more than 6 in outside of the 12 in walkway width.   Gait with Horizontal Head Turns Performs head turns smoothly with no change in gait. Deviates no more than 6 in outside 12 in walkway width   Gait with Vertical Head Turns  Performs head turns with no change in gait. Deviates no more than 6 in outside 12 in walkway width.   Gait and Pivot Turn Pivot turns safely within 3 sec and stops quickly with no loss of balance.   Step Over Obstacle Is able to step over 2 stacked shoe boxes taped together (9 in total height) without changing gait speed. No evidence of imbalance.   Gait with Narrow Base of Support Ambulates 4-7 steps.   Gait with Eyes Closed Cannot walk 20 ft without assistance, severe gait deviations or imbalance, deviates greater than 15 in outside 12 in walkway width or will not attempt task.   Ambulating Backwards Walks 20 ft, no assistive devices, good speed, no evidence for imbalance, normal gait    Steps Alternating feet, no rail.   Total Score 25   FGA comment: 25/30                          Balance Exercises - 2016/06/15 1153      Balance Exercises: Standing   Tandem Gait Forward;Retro;Intermittent upper extremity support;4 reps   Other Standing Exercises gait forwards and retro with eyes closed with UE support and min A due to L lateral LOB x 4 reps           PT Education - 15-Jun-2016 1154    Education provided Yes   Education Details progressed and added to balance HEP   Person(s) Educated Patient   Methods Explanation;Demonstration;Handout   Comprehension Verbalized understanding             PT Long Term Goals - 2016/06/15 1158      PT LONG TERM GOAL #2   Title Pt will be independent with vestibular and balance HEP   Status On-going     PT LONG TERM GOAL #4   Title Will report 17 point improvement on [place] for improved function overall   Baseline 68   Status On-going     PT LONG TERM GOAL #5   Title Pt will demonstrate decreased falls risk and improved balance as indicated by improvement in FGA score to >25/30   Baseline 25/30 on 06-16-2022   Status Achieved               Plan - 2016/06/15 1155    Clinical Impression Statement Performed re-assessment of FGA for 10th visit progress note with pt demonstrating improvement in changes in gait speed and balance with head turns.  Pt continues to have difficulty with maintaining balance during tandem gait and eyes closed; added tandem gait and gait with eyes closed forwards and backwards and retro gait with 180 turns to HEP.  Pt to return for one more visit to check HEP and then will be ready to D/C from therapy.   Rehab Potential Good   Clinical Impairments Affecting Rehab Potential Parkinson's disease   PT Treatment/Interventions ADLs/Self Care Home Management;Canalith Repostioning;Gait training;Functional mobility training;Therapeutic activities;Therapeutic exercise;Balance training;Neuromuscular  re-education;Patient/family education;Vestibular   PT Next Visit Plan check HEP, FOTO, D/C   Consulted and Agree with Plan of Care Patient      Patient will benefit from skilled therapeutic intervention in order to improve the following deficits and impairments:  Decreased balance, Dizziness, Difficulty walking  Visit Diagnosis: Dizziness and giddiness  Unsteadiness on feet  BPPV (benign paroxysmal positional vertigo), left       G-Codes - 2016/06/15 1158    Functional Assessment Tool Used (Outpatient Only) FGA: 25/30  Functional Limitation Mobility: Walking and moving around   Mobility: Walking and Moving Around Current Status [phone]) At least 1 percent but less than 20 percent impaired, limited or restricted   Mobility: Walking and Moving Around Goal Status [phone]) At least 1 percent but less than 20 percent impaired, limited or restricted      Problem List Patient Active Problem List   Diagnosis Date Noted  . Vertigo 03/12/2016  . Chest congestion 02/24/2015  . Peripheral edema 07/09/2014  . Restrictive lung disease 12/20/2013  . DOE (dyspnea on exertion) 11/18/2013  . Hot flashes 11/18/2013  . Chest pain 08/04/2013  . Memory impairment 08/04/2013  . Orthostasis 02/26/2013  . Tachycardia 02/24/2013  . Coronary artery calcification seen on CAT scan 01/01/2013  . Tendonitis, calcific, shoulder 01/01/2013  . OSA (obstructive sleep apnea) 01/01/2013  . PD (Parkinson's disease) ([place]) 11/26/2011  . Hematochezia 11/09/2011  . Colon polyps 11/09/2011  . IBS (irritable bowel syndrome) 11/09/2011  . Anosmia 11/06/2010  . Impaired glucose tolerance 11/04/2010  . Hearing loss 05/07/2010  . Preventative health care 05/07/2010  . BUNION, RIGHT FOOT 09/18/2009  . GERD 05/24/2008  . OSTEOPOROSIS 05/24/2008  . Hyperlipidemia 09/23/2006  . Anxiety state 09/23/2006  . Depression 09/23/2006  . CARPAL TUNNEL SYNDROME, BILATERAL 09/23/2006  . Essential hypertension 09/23/2006  .  DIVERTICULOSIS, COLON 09/23/2006   Physical Therapy Progress Note  Dates of Reporting Period: 03/18/16 to 05/30/16  Objective Reports of Subjective Statement: See above  Objective Measurements: FGA above  Goal Update: See goals above  Plan: See plan above  Reason Skilled Services are Required: To continue to address balance deficits and decrease falls risk.  Raylene Everts, PT, DPT 05/30/16    12:01 PM     [facility] [address] [place], [place], [address] Phone: [phone]   Fax:  [phone]  Name: TONIYAH DILMORE MRN: [id number] Date of Birth: 25-Aug-1950

## 2016-06-03 ENCOUNTER — Ambulatory Visit: Payer: Medicare Other | Admitting: Adult Health

## 2016-06-05 ENCOUNTER — Encounter: Payer: Self-pay | Admitting: Physical Therapy

## 2016-06-05 ENCOUNTER — Ambulatory Visit: Payer: Medicare Other | Admitting: Physical Therapy

## 2016-06-05 DIAGNOSIS — R2681 Unsteadiness on feet: Secondary | ICD-10-CM

## 2016-06-05 DIAGNOSIS — R42 Dizziness and giddiness: Secondary | ICD-10-CM | POA: Diagnosis not present

## 2016-06-05 DIAGNOSIS — H8112 Benign paroxysmal vertigo, left ear: Secondary | ICD-10-CM

## 2016-06-05 NOTE — Therapy (Signed)
[place] [address] [place], [place], [address] Phone: [phone]   Fax:  [phone]  Physical Therapy Treatment  Patient Details  Name: Destiny Roth MRN: [id number] Date of Birth: 17-Jun-1950 Referring Provider: Golden Circle, FNP  Encounter Date: 06/05/2016      PT End of Session - 06/05/16 1100    Visit Number 11   Number of Visits 12   Date for PT Re-Evaluation 06/15/16  D/C today; no further visits needed   Authorization Type Medicare Part A and B-G code and progress note every 10th visit   PT Start Time 1027  pt arrived late   PT Stop Time 1106   PT Time Calculation (min) 39 min   Activity Tolerance Patient tolerated treatment well   Behavior During Therapy [facility] for tasks assessed/performed      Past Medical History:  Diagnosis Date  . Abdominal pain, epigastric 09/18/2009  . Anosmia 11/06/2010  . ANXIETY 09/23/2006  . BUNION, RIGHT FOOT 09/18/2009  . CARPAL TUNNEL SYNDROME, BILATERAL 09/23/2006  . DEPRESSION 09/23/2006  . DIVERTICULOSIS, COLON 09/23/2006  . GERD 05/24/2008  . GLUCOSE INTOLERANCE 05/24/2008  . Hemorrhoids   . HYPERLIPIDEMIA 09/23/2006  . HYPERTENSION 09/23/2006  . IBS 09/23/2006  . Impaired glucose tolerance 11/04/2010  . OSTEOPOROSIS 05/24/2008  . Parkinson's disease ([place])   . Restrictive lung disease 12/20/2013    Past Surgical History:  Procedure Laterality Date  . CARDIAC CATHETERIZATION    . CATARACT EXTRACTION    . COLONOSCOPY W/ BIOPSIES  2008   Dr. Collene Mares -negative random bxs  . ESOPHAGOGASTRODUODENOSCOPY  2008   Dr. Harriette Bouillon duodenal ulcer  . FOOT SURGERY Bilateral 2015  . LEFT AND RIGHT HEART CATHETERIZATION WITH CORONARY ANGIOGRAM N/A 11/09/2013   Procedure: LEFT AND RIGHT HEART CATHETERIZATION WITH CORONARY ANGIOGRAM;  Surgeon: Peter M [place], MD;  Location: [facility] CATH LAB;  Service: Cardiovascular;  Laterality: N/A;  . nasal septum repair    . s/p bilat CTS     . s/p fibroid ablation    . s/p ganglion cyst right wrist    . s/p right thumb trigger finger surgury    . SHOULDER SURGERY Right 2015  . TUBAL LIGATION      There were no vitals filed for this visit.      Subjective Assessment - 06/05/16 1031    Subjective Pt continues to do well, reports no more spinning "whooshing".  Still doing exercises at home with no issues or questions.  Reports eyes closed and dark rooms still cause LOB.  Went to [place] yesterday and noticed that standing and looking at the waves caused her to lose her balance.   Pertinent History Parkinson's   Limitations Walking   Patient Stated Goals To get rid of the dizziness and buzzing in ears   Currently in Pain? No/denies            [facility] PT Assessment - 06/05/16 1732      Observation/Other Assessments   Focus on Therapeutic Outcomes (FOTO)  86% (14% limited)   Dizziness Handicap Inventory [facility])  36                      Vestibular Treatment/Exercise - 06/05/16 1038      Vestibular Treatment/Exercise   Vestibular Treatment Provided Canalith Repositioning   Canalith Repositioning Epley Manuever Right;Epley Manuever Left      EPLEY MANUEVER RIGHT   Number of Reps  1  Response Details  Educated on and return demonstrated self treatment with home epley manuever with pillow; verbalized sequence to both directions, return demonstrated technique for R side               PT Education - 2016-06-08 1059    Education provided Yes   Education Details Self-epley home treatment if vertigo returns, prognosis of recovery, activities that may cause symptoms   Person(s) Educated Patient   Methods Explanation;Handout;Demonstration   Comprehension Verbalized understanding;Returned demonstration             PT Long Term Goals - 06/08/2016 1101      PT LONG TERM GOAL #2   Title Pt will be independent with vestibular and balance HEP   Baseline met 2016-06-08   Status Achieved     PT  LONG TERM GOAL #4   Title Will report 17 point improvement on [place] for improved function overall   Baseline 36 on 06-08-2016   Status Achieved     PT LONG TERM GOAL #5   Title Pt will demonstrate decreased falls risk and improved balance as indicated by improvement in FGA score to >25/30   Baseline 25/30 on 5/3   Status Achieved               Plan - 06-08-16 1734    Clinical Impression Statement Pt returned for final visit to review HEP and re-assess [place].  Pt has made excellent progress and has met all LTG including a significant drop in [place] score indicating improved functional mobility independence.  Pt continues to have questions about reoccurence of BPPV and treatment.  Educated on home self-treatment but also advised pt to contact physician for referral if unable to resolve symptoms after a few treatments due to patient's BPPV involving multiple canals, bilaterally.  Pt pleased with progress and agreeable to D/C.     Rehab Potential Good   Clinical Impairments Affecting Rehab Potential Parkinson's disease   PT Treatment/Interventions ADLs/Self Care Home Management;Canalith Repostioning;Gait training;Functional mobility training;Therapeutic activities;Therapeutic exercise;Balance training;Neuromuscular re-education;Patient/family education;Vestibular   PT Next Visit Plan D/C   Consulted and Agree with Plan of Care Patient      Patient will benefit from skilled therapeutic intervention in order to improve the following deficits and impairments:  Decreased balance, Dizziness, Difficulty walking  Visit Diagnosis: Dizziness and giddiness  Unsteadiness on feet  BPPV (benign paroxysmal positional vertigo), left       G-Codes - 06-08-16 1101    Functional Assessment Tool Used (Outpatient Only) FGA: 25/30   Functional Limitation Mobility: Walking and moving around   Mobility: Walking and Moving Around Goal Status [phone]) At least 1 percent but less than 20 percent impaired, limited  or restricted   Mobility: Walking and Moving Around Discharge Status [phone]) At least 1 percent but less than 20 percent impaired, limited or restricted      Problem List Patient Active Problem List   Diagnosis Date Noted  . Vertigo 03/12/2016  . Chest congestion 02/24/2015  . Peripheral edema 07/09/2014  . Restrictive lung disease 12/20/2013  . DOE (dyspnea on exertion) 11/18/2013  . Hot flashes 11/18/2013  . Chest pain 08/04/2013  . Memory impairment 08/04/2013  . Orthostasis 02/26/2013  . Tachycardia 02/24/2013  . Coronary artery calcification seen on CAT scan 01/01/2013  . Tendonitis, calcific, shoulder 01/01/2013  . OSA (obstructive sleep apnea) 01/01/2013  . PD (Parkinson's disease) ([place]) 11/26/2011  . Hematochezia 11/09/2011  . Colon polyps 11/09/2011  . IBS (irritable bowel  syndrome) 11/09/2011  . Anosmia 11/06/2010  . Impaired glucose tolerance 11/04/2010  . Hearing loss 05/07/2010  . Preventative health care 05/07/2010  . BUNION, RIGHT FOOT 09/18/2009  . GERD 05/24/2008  . OSTEOPOROSIS 05/24/2008  . Hyperlipidemia 09/23/2006  . Anxiety state 09/23/2006  . Depression 09/23/2006  . CARPAL TUNNEL SYNDROME, BILATERAL 09/23/2006  . Essential hypertension 09/23/2006  . DIVERTICULOSIS, COLON 09/23/2006   PHYSICAL THERAPY DISCHARGE SUMMARY  Visits from Start of Care: 11  Current functional level related to goals / functional outcomes: See goals above-all LTG met   Remaining deficits: Disequilibrium, impaired balance/gait   Education / Equipment: HEP  Plan: Patient agrees to discharge.  Patient goals were met. Patient is being discharged due to meeting the stated rehab goals.  ?????     Raylene Everts, PT, DPT 06/05/16    5:39 PM    [place] [address] [place], [place], [address] Phone: [phone]   Fax:  [phone]  Name: Destiny Roth MRN: [id number] Date of Birth:  1950/09/12

## 2016-06-05 NOTE — Patient Instructions (Addendum)
Self Treatment for Left Side-IF SYMPTOMS PRESENT WHEN LYING DOWN WITH HEAD TURNED TO LEFT SIDE    Sitting on bed-PILLOW BEHIND LOW BACK: 1. Turn head 45 left. (a) Lie back slowly, shoulders on pillow, head on bed. (b) Hold __30-40__ seconds. 2. Keeping head on bed, turn head 90 right. Hold _30-40___ seconds. 3. Roll to right, head on 45 angle down toward bed. Hold __30-40__ seconds. 4. Tuck your chin and Sit up on right side of bed. Repeat __2__ times per session. Do __1__ sessions per day.  Copyright  VHI. All rights reserved.   Self Treatment for Right Side-IF SYMPTOMS PRESENT WITH LYING BACK AND HEAD TURNED TO RIGHT    Sitting on bed WITH PILLOW BEHIND LOW BACK: 1. Turn head 45 right. (a) Lie back slowly, shoulders on pillow, head on bed. (b) Hold _30-40___ seconds. 2. Keeping head on bed, turn head 90 left. Hold _30-40___ seconds. 3. Roll to left, head on 45 angle down toward bed. Hold _30-40___ seconds. 4. Tuck your chin and Sit up on left side of bed. Repeat __2__ times per session. Do __1__ sessions per day.   IF SYMPTOMS DO NOT RESOLVE AFTER 2-3 DAYS OF SELF-TREATMENT, CALL DR TO GET ANOTHER REFERRAL FOR PT

## 2016-06-17 ENCOUNTER — Encounter (HOSPITAL_BASED_OUTPATIENT_CLINIC_OR_DEPARTMENT_OTHER): Payer: Medicare Other

## 2016-06-19 DIAGNOSIS — H16223 Keratoconjunctivitis sicca, not specified as Sjogren's, bilateral: Secondary | ICD-10-CM | POA: Diagnosis not present

## 2016-06-19 DIAGNOSIS — Z961 Presence of intraocular lens: Secondary | ICD-10-CM | POA: Diagnosis not present

## 2016-07-18 ENCOUNTER — Encounter: Payer: Self-pay | Admitting: Internal Medicine

## 2016-07-22 DIAGNOSIS — H16143 Punctate keratitis, bilateral: Secondary | ICD-10-CM | POA: Diagnosis not present

## 2016-07-22 DIAGNOSIS — H16223 Keratoconjunctivitis sicca, not specified as Sjogren's, bilateral: Secondary | ICD-10-CM | POA: Diagnosis not present

## 2016-07-22 DIAGNOSIS — H0289 Other specified disorders of eyelid: Secondary | ICD-10-CM | POA: Diagnosis not present

## 2016-09-02 DIAGNOSIS — H0289 Other specified disorders of eyelid: Secondary | ICD-10-CM | POA: Diagnosis not present

## 2016-09-02 DIAGNOSIS — H16143 Punctate keratitis, bilateral: Secondary | ICD-10-CM | POA: Diagnosis not present

## 2016-09-02 DIAGNOSIS — H16223 Keratoconjunctivitis sicca, not specified as Sjogren's, bilateral: Secondary | ICD-10-CM | POA: Diagnosis not present

## 2016-09-02 DIAGNOSIS — Z961 Presence of intraocular lens: Secondary | ICD-10-CM | POA: Diagnosis not present

## 2016-10-16 DIAGNOSIS — R5383 Other fatigue: Secondary | ICD-10-CM | POA: Insufficient documentation

## 2016-10-16 DIAGNOSIS — G4752 REM sleep behavior disorder: Secondary | ICD-10-CM | POA: Insufficient documentation

## 2016-10-16 DIAGNOSIS — G4733 Obstructive sleep apnea (adult) (pediatric): Secondary | ICD-10-CM | POA: Diagnosis not present

## 2016-10-16 DIAGNOSIS — G20 Parkinson's disease: Secondary | ICD-10-CM | POA: Diagnosis not present

## 2016-10-16 DIAGNOSIS — F32A Depression, unspecified: Secondary | ICD-10-CM | POA: Insufficient documentation

## 2016-10-16 DIAGNOSIS — F329 Major depressive disorder, single episode, unspecified: Secondary | ICD-10-CM | POA: Diagnosis not present

## 2016-10-16 DIAGNOSIS — Z87898 Personal history of other specified conditions: Secondary | ICD-10-CM | POA: Diagnosis not present

## 2016-10-16 DIAGNOSIS — H8190 Unspecified disorder of vestibular function, unspecified ear: Secondary | ICD-10-CM | POA: Diagnosis not present

## 2016-10-17 ENCOUNTER — Other Ambulatory Visit: Payer: Self-pay | Admitting: Internal Medicine

## 2016-10-22 ENCOUNTER — Encounter: Payer: Self-pay | Admitting: Pulmonary Disease

## 2016-10-23 NOTE — Telephone Encounter (Signed)
From: [name] A. Ouida Sills    Sent: 10/22/2016  7:18 PM EDT      To: Rigoberto Noel., MD Subject: Non-Urgent Medical Question  I am a PD patient of Dr. Anna Genre at [facility] and am still having REM sleep issues. I did have a sleep study done earlier this year but did not qualify for a C-Pap. My last appointment with Dr. Anna Genre was last Wednesday 10/16/16 at [name]. They are on Epic. He asked me to contact you to see if there are any other options for me such as a mouthpiece for mild OSA; is it effective? Also, what is your opinion of the drug Modafinil for fatigue? Thank you for your help, Dr. Elsworth Soho.  Sincerely, [name]     RA please advise. Thanks.

## 2016-10-25 NOTE — Telephone Encounter (Signed)
I reviewed her sleep study agai Was unable to find any notes from dr Anna Genre in care everywhere Do not feel she will benefit from CPAP or oral appliance She can certainly try modafinil for excessive slepiness

## 2016-10-28 NOTE — Telephone Encounter (Signed)
Called Dr. Alberteen Sam office, nurse is faxing last OV note to our office. Will route to [place] to look out for OV notes.

## 2016-11-13 ENCOUNTER — Encounter: Payer: Self-pay | Admitting: Pulmonary Disease

## 2016-11-14 NOTE — Telephone Encounter (Signed)
Spoke with Destiny Roth, records were received from Dr Anna Genre >> RA signed them and sent them for scan.  No additional recommendations Patient emailed again on 10.17.18 asking if the records were ever received E-mail sent to patient informing her that they were indeed received and that it appears RA had no further recommendations Records are not scanned in pt's chart as of yet but this time frame is typical

## 2016-11-16 DIAGNOSIS — Z23 Encounter for immunization: Secondary | ICD-10-CM | POA: Diagnosis not present

## 2016-11-26 DIAGNOSIS — H6122 Impacted cerumen, left ear: Secondary | ICD-10-CM | POA: Diagnosis not present

## 2016-11-26 DIAGNOSIS — H9313 Tinnitus, bilateral: Secondary | ICD-10-CM | POA: Diagnosis not present

## 2016-11-26 DIAGNOSIS — R42 Dizziness and giddiness: Secondary | ICD-10-CM | POA: Diagnosis not present

## 2016-11-26 DIAGNOSIS — H832X2 Labyrinthine dysfunction, left ear: Secondary | ICD-10-CM | POA: Diagnosis not present

## 2016-11-29 DIAGNOSIS — H9313 Tinnitus, bilateral: Secondary | ICD-10-CM | POA: Diagnosis not present

## 2016-12-10 DIAGNOSIS — R2689 Other abnormalities of gait and mobility: Secondary | ICD-10-CM | POA: Diagnosis not present

## 2016-12-10 DIAGNOSIS — R42 Dizziness and giddiness: Secondary | ICD-10-CM | POA: Diagnosis not present

## 2017-01-09 ENCOUNTER — Other Ambulatory Visit: Payer: Self-pay | Admitting: Internal Medicine

## 2017-01-10 ENCOUNTER — Telehealth: Payer: Self-pay | Admitting: Internal Medicine

## 2017-01-10 MED ORDER — ESCITALOPRAM OXALATE 20 MG PO TABS: 20.0000 mg | ORAL_TABLET | Freq: Every day | ORAL | 1 refills | Status: DC

## 2017-01-10 NOTE — Telephone Encounter (Signed)
Copied from [place]. Topic: Quick Communication - See Telephone Encounter >> Jan 10, 2017 12:46 PM Synthia Innocent wrote: CRM for notification. See Telephone encounter for:  Drug interaction between, eldeerly 5mg  BID and escitalopram (LEXAPRO) 20 MG tablet  01/10/17.

## 2017-01-10 NOTE — Telephone Encounter (Signed)
I was only able to do 30 days and 1 refill to [facility] local [facility] instead of mailin [facility], as pt is due for [place] was jan 2017  Please make ROV for further refills

## 2017-01-10 NOTE — Telephone Encounter (Signed)
I am not familiar with the first medication mentioned  Please clarify

## 2017-01-10 NOTE — Telephone Encounter (Signed)
Medication interaction.

## 2017-01-14 ENCOUNTER — Telehealth: Payer: Self-pay | Admitting: Internal Medicine

## 2017-01-14 ENCOUNTER — Other Ambulatory Visit: Payer: Self-pay | Admitting: *Deleted

## 2017-01-14 MED ORDER — ESCITALOPRAM OXALATE 20 MG PO TABS: 20.0000 mg | ORAL_TABLET | Freq: Every day | ORAL | 3 refills | Status: DC

## 2017-01-14 NOTE — Telephone Encounter (Signed)
Attempted to contact pt regarding refill request being sent to wrong pharmacy; left message on cell phone voice mail [phone]; also attempted to contact pt on home phone; left message on this voice mail also [phone]

## 2017-01-14 NOTE — Telephone Encounter (Signed)
Rx for Lexapro was sent to the wrong pharmacy.   I spoke with pt and got it changed over to the [facility].

## 2017-01-29 DIAGNOSIS — R42 Dizziness and giddiness: Secondary | ICD-10-CM | POA: Diagnosis not present

## 2017-01-29 DIAGNOSIS — R2689 Other abnormalities of gait and mobility: Secondary | ICD-10-CM | POA: Diagnosis not present

## 2017-02-05 DIAGNOSIS — R42 Dizziness and giddiness: Secondary | ICD-10-CM | POA: Diagnosis not present

## 2017-02-05 DIAGNOSIS — G20 Parkinson's disease: Secondary | ICD-10-CM | POA: Diagnosis not present

## 2017-02-05 DIAGNOSIS — R2689 Other abnormalities of gait and mobility: Secondary | ICD-10-CM | POA: Diagnosis not present

## 2017-02-13 DIAGNOSIS — R2689 Other abnormalities of gait and mobility: Secondary | ICD-10-CM | POA: Diagnosis not present

## 2017-02-13 DIAGNOSIS — R42 Dizziness and giddiness: Secondary | ICD-10-CM | POA: Diagnosis not present

## 2017-02-20 DIAGNOSIS — R200 Anesthesia of skin: Secondary | ICD-10-CM | POA: Diagnosis not present

## 2017-02-20 DIAGNOSIS — G20 Parkinson's disease: Secondary | ICD-10-CM | POA: Diagnosis not present

## 2017-02-20 DIAGNOSIS — R42 Dizziness and giddiness: Secondary | ICD-10-CM | POA: Diagnosis not present

## 2017-02-20 DIAGNOSIS — R5383 Other fatigue: Secondary | ICD-10-CM | POA: Diagnosis not present

## 2017-02-20 DIAGNOSIS — Z5181 Encounter for therapeutic drug level monitoring: Secondary | ICD-10-CM | POA: Diagnosis not present

## 2017-02-20 DIAGNOSIS — Z13220 Encounter for screening for lipoid disorders: Secondary | ICD-10-CM | POA: Diagnosis not present

## 2017-02-20 DIAGNOSIS — F332 Major depressive disorder, recurrent severe without psychotic features: Secondary | ICD-10-CM | POA: Diagnosis not present

## 2017-02-20 DIAGNOSIS — Z7689 Persons encountering health services in other specified circumstances: Secondary | ICD-10-CM | POA: Diagnosis not present

## 2017-02-20 DIAGNOSIS — Z131 Encounter for screening for diabetes mellitus: Secondary | ICD-10-CM | POA: Diagnosis not present

## 2017-02-20 DIAGNOSIS — Z79899 Other long term (current) drug therapy: Secondary | ICD-10-CM | POA: Diagnosis not present

## 2017-02-20 DIAGNOSIS — R030 Elevated blood-pressure reading, without diagnosis of hypertension: Secondary | ICD-10-CM | POA: Diagnosis not present

## 2017-02-20 DIAGNOSIS — R2689 Other abnormalities of gait and mobility: Secondary | ICD-10-CM | POA: Diagnosis not present

## 2017-02-24 DIAGNOSIS — F331 Major depressive disorder, recurrent, moderate: Secondary | ICD-10-CM | POA: Diagnosis not present

## 2017-02-27 DIAGNOSIS — R42 Dizziness and giddiness: Secondary | ICD-10-CM | POA: Diagnosis not present

## 2017-02-27 DIAGNOSIS — R2689 Other abnormalities of gait and mobility: Secondary | ICD-10-CM | POA: Diagnosis not present

## 2017-03-10 DIAGNOSIS — R42 Dizziness and giddiness: Secondary | ICD-10-CM | POA: Diagnosis not present

## 2017-03-10 DIAGNOSIS — R2689 Other abnormalities of gait and mobility: Secondary | ICD-10-CM | POA: Diagnosis not present

## 2017-03-24 DIAGNOSIS — Z1211 Encounter for screening for malignant neoplasm of colon: Secondary | ICD-10-CM | POA: Diagnosis not present

## 2017-03-24 DIAGNOSIS — Z0000 Encounter for general adult medical examination without abnormal findings: Secondary | ICD-10-CM | POA: Diagnosis not present

## 2017-03-24 DIAGNOSIS — I10 Essential (primary) hypertension: Secondary | ICD-10-CM | POA: Diagnosis not present

## 2017-03-24 DIAGNOSIS — Z1159 Encounter for screening for other viral diseases: Secondary | ICD-10-CM | POA: Diagnosis not present

## 2017-03-24 DIAGNOSIS — E7800 Pure hypercholesterolemia, unspecified: Secondary | ICD-10-CM | POA: Diagnosis not present

## 2017-03-24 DIAGNOSIS — R2689 Other abnormalities of gait and mobility: Secondary | ICD-10-CM | POA: Diagnosis not present

## 2017-03-24 DIAGNOSIS — Z1231 Encounter for screening mammogram for malignant neoplasm of breast: Secondary | ICD-10-CM | POA: Diagnosis not present

## 2017-03-24 DIAGNOSIS — R42 Dizziness and giddiness: Secondary | ICD-10-CM | POA: Diagnosis not present

## 2017-04-03 DIAGNOSIS — H04123 Dry eye syndrome of bilateral lacrimal glands: Secondary | ICD-10-CM | POA: Diagnosis not present

## 2017-04-03 DIAGNOSIS — H43392 Other vitreous opacities, left eye: Secondary | ICD-10-CM | POA: Diagnosis not present

## 2017-04-03 DIAGNOSIS — Z961 Presence of intraocular lens: Secondary | ICD-10-CM | POA: Diagnosis not present

## 2017-04-07 DIAGNOSIS — R42 Dizziness and giddiness: Secondary | ICD-10-CM | POA: Diagnosis not present

## 2017-04-07 DIAGNOSIS — Z1231 Encounter for screening mammogram for malignant neoplasm of breast: Secondary | ICD-10-CM | POA: Diagnosis not present

## 2017-04-07 DIAGNOSIS — R2689 Other abnormalities of gait and mobility: Secondary | ICD-10-CM | POA: Diagnosis not present

## 2017-04-16 DIAGNOSIS — M792 Neuralgia and neuritis, unspecified: Secondary | ICD-10-CM | POA: Diagnosis not present

## 2017-04-16 DIAGNOSIS — G20 Parkinson's disease: Secondary | ICD-10-CM | POA: Diagnosis not present

## 2017-04-16 DIAGNOSIS — H8190 Unspecified disorder of vestibular function, unspecified ear: Secondary | ICD-10-CM | POA: Diagnosis not present

## 2017-05-14 DIAGNOSIS — F418 Other specified anxiety disorders: Secondary | ICD-10-CM | POA: Diagnosis not present

## 2017-05-28 DIAGNOSIS — F09 Unspecified mental disorder due to known physiological condition: Secondary | ICD-10-CM | POA: Diagnosis not present

## 2017-05-28 DIAGNOSIS — F331 Major depressive disorder, recurrent, moderate: Secondary | ICD-10-CM | POA: Insufficient documentation

## 2017-06-02 DIAGNOSIS — E7800 Pure hypercholesterolemia, unspecified: Secondary | ICD-10-CM | POA: Diagnosis not present

## 2017-06-02 DIAGNOSIS — R42 Dizziness and giddiness: Secondary | ICD-10-CM | POA: Diagnosis not present

## 2017-06-02 DIAGNOSIS — I10 Essential (primary) hypertension: Secondary | ICD-10-CM | POA: Diagnosis not present

## 2017-06-16 DIAGNOSIS — H8190 Unspecified disorder of vestibular function, unspecified ear: Secondary | ICD-10-CM | POA: Diagnosis not present

## 2017-06-16 DIAGNOSIS — G4752 REM sleep behavior disorder: Secondary | ICD-10-CM | POA: Diagnosis not present

## 2017-06-16 DIAGNOSIS — G20 Parkinson's disease: Secondary | ICD-10-CM | POA: Diagnosis not present

## 2017-06-25 DIAGNOSIS — F09 Unspecified mental disorder due to known physiological condition: Secondary | ICD-10-CM | POA: Diagnosis not present

## 2017-06-25 DIAGNOSIS — F331 Major depressive disorder, recurrent, moderate: Secondary | ICD-10-CM | POA: Diagnosis not present

## 2017-06-25 DIAGNOSIS — F418 Other specified anxiety disorders: Secondary | ICD-10-CM | POA: Diagnosis not present

## 2017-07-08 DIAGNOSIS — R0789 Other chest pain: Secondary | ICD-10-CM | POA: Diagnosis not present

## 2017-07-08 DIAGNOSIS — I10 Essential (primary) hypertension: Secondary | ICD-10-CM | POA: Diagnosis not present

## 2017-07-08 DIAGNOSIS — E7800 Pure hypercholesterolemia, unspecified: Secondary | ICD-10-CM | POA: Diagnosis not present

## 2017-07-11 DIAGNOSIS — F418 Other specified anxiety disorders: Secondary | ICD-10-CM | POA: Diagnosis not present

## 2017-07-16 DIAGNOSIS — R0789 Other chest pain: Secondary | ICD-10-CM | POA: Diagnosis not present

## 2017-07-24 DIAGNOSIS — H818X2 Other disorders of vestibular function, left ear: Secondary | ICD-10-CM | POA: Insufficient documentation

## 2017-07-24 DIAGNOSIS — G20 Parkinson's disease: Secondary | ICD-10-CM | POA: Diagnosis not present

## 2017-07-24 DIAGNOSIS — R42 Dizziness and giddiness: Secondary | ICD-10-CM | POA: Diagnosis not present

## 2017-08-04 DIAGNOSIS — R42 Dizziness and giddiness: Secondary | ICD-10-CM | POA: Diagnosis not present

## 2017-08-04 DIAGNOSIS — G20 Parkinson's disease: Secondary | ICD-10-CM | POA: Diagnosis not present

## 2017-08-04 DIAGNOSIS — H818X2 Other disorders of vestibular function, left ear: Secondary | ICD-10-CM | POA: Diagnosis not present

## 2017-08-19 DIAGNOSIS — R42 Dizziness and giddiness: Secondary | ICD-10-CM | POA: Diagnosis not present

## 2017-08-19 DIAGNOSIS — R2689 Other abnormalities of gait and mobility: Secondary | ICD-10-CM | POA: Diagnosis not present

## 2017-08-25 DIAGNOSIS — R2689 Other abnormalities of gait and mobility: Secondary | ICD-10-CM | POA: Diagnosis not present

## 2017-08-25 DIAGNOSIS — R42 Dizziness and giddiness: Secondary | ICD-10-CM | POA: Diagnosis not present

## 2017-09-09 DIAGNOSIS — E876 Hypokalemia: Secondary | ICD-10-CM | POA: Diagnosis not present

## 2017-09-09 DIAGNOSIS — I10 Essential (primary) hypertension: Secondary | ICD-10-CM | POA: Diagnosis not present

## 2017-09-09 DIAGNOSIS — E7800 Pure hypercholesterolemia, unspecified: Secondary | ICD-10-CM | POA: Diagnosis not present

## 2017-09-09 DIAGNOSIS — R2689 Other abnormalities of gait and mobility: Secondary | ICD-10-CM | POA: Diagnosis not present

## 2017-09-09 DIAGNOSIS — F418 Other specified anxiety disorders: Secondary | ICD-10-CM | POA: Diagnosis not present

## 2017-09-09 DIAGNOSIS — R42 Dizziness and giddiness: Secondary | ICD-10-CM | POA: Diagnosis not present

## 2017-10-29 DIAGNOSIS — Z23 Encounter for immunization: Secondary | ICD-10-CM | POA: Diagnosis not present

## 2017-10-29 DIAGNOSIS — G20 Parkinson's disease: Secondary | ICD-10-CM | POA: Diagnosis not present

## 2017-11-17 DIAGNOSIS — H8110 Benign paroxysmal vertigo, unspecified ear: Secondary | ICD-10-CM | POA: Diagnosis not present

## 2017-12-17 DIAGNOSIS — H8110 Benign paroxysmal vertigo, unspecified ear: Secondary | ICD-10-CM | POA: Diagnosis not present

## 2017-12-17 DIAGNOSIS — R2689 Other abnormalities of gait and mobility: Secondary | ICD-10-CM | POA: Diagnosis not present

## 2017-12-24 DIAGNOSIS — H8110 Benign paroxysmal vertigo, unspecified ear: Secondary | ICD-10-CM | POA: Diagnosis not present

## 2017-12-24 DIAGNOSIS — R2689 Other abnormalities of gait and mobility: Secondary | ICD-10-CM | POA: Diagnosis not present

## 2017-12-30 DIAGNOSIS — H8110 Benign paroxysmal vertigo, unspecified ear: Secondary | ICD-10-CM | POA: Diagnosis not present

## 2017-12-30 DIAGNOSIS — R2689 Other abnormalities of gait and mobility: Secondary | ICD-10-CM | POA: Diagnosis not present

## 2018-01-05 DIAGNOSIS — H8110 Benign paroxysmal vertigo, unspecified ear: Secondary | ICD-10-CM | POA: Diagnosis not present

## 2018-01-07 DIAGNOSIS — R2689 Other abnormalities of gait and mobility: Secondary | ICD-10-CM | POA: Diagnosis not present

## 2018-01-07 DIAGNOSIS — H8110 Benign paroxysmal vertigo, unspecified ear: Secondary | ICD-10-CM | POA: Diagnosis not present

## 2018-01-09 DIAGNOSIS — R2689 Other abnormalities of gait and mobility: Secondary | ICD-10-CM | POA: Diagnosis not present

## 2018-01-09 DIAGNOSIS — H8110 Benign paroxysmal vertigo, unspecified ear: Secondary | ICD-10-CM | POA: Diagnosis not present

## 2018-01-13 DIAGNOSIS — E7800 Pure hypercholesterolemia, unspecified: Secondary | ICD-10-CM | POA: Diagnosis not present

## 2018-01-13 DIAGNOSIS — I10 Essential (primary) hypertension: Secondary | ICD-10-CM | POA: Diagnosis not present

## 2018-01-13 DIAGNOSIS — G20 Parkinson's disease: Secondary | ICD-10-CM | POA: Diagnosis not present

## 2018-01-13 DIAGNOSIS — F419 Anxiety disorder, unspecified: Secondary | ICD-10-CM | POA: Diagnosis not present

## 2018-01-13 DIAGNOSIS — R5382 Chronic fatigue, unspecified: Secondary | ICD-10-CM | POA: Diagnosis not present

## 2018-01-14 DIAGNOSIS — H8110 Benign paroxysmal vertigo, unspecified ear: Secondary | ICD-10-CM | POA: Diagnosis not present

## 2018-01-14 DIAGNOSIS — R2689 Other abnormalities of gait and mobility: Secondary | ICD-10-CM | POA: Diagnosis not present

## 2019-01-29 DIAGNOSIS — N289 Disorder of kidney and ureter, unspecified: Secondary | ICD-10-CM

## 2019-01-29 HISTORY — DX: Disorder of kidney and ureter, unspecified: N28.9

## 2019-02-03 ENCOUNTER — Other Ambulatory Visit: Payer: Self-pay

## 2019-02-03 ENCOUNTER — Inpatient Hospital Stay (HOSPITAL_COMMUNITY)
Admission: EM | Admit: 2019-02-03 | Discharge: 2019-02-11 | DRG: 640 | Disposition: A | Discharge status: Home Health | Payer: Medicare Other | Attending: Family Medicine | Admitting: Family Medicine

## 2019-02-03 ENCOUNTER — Encounter (HOSPITAL_COMMUNITY): Payer: Self-pay

## 2019-02-03 DIAGNOSIS — Z91018 Allergy to other foods: Secondary | ICD-10-CM

## 2019-02-03 DIAGNOSIS — Z951 Presence of aortocoronary bypass graft: Secondary | ICD-10-CM

## 2019-02-03 DIAGNOSIS — G4733 Obstructive sleep apnea (adult) (pediatric): Secondary | ICD-10-CM | POA: Diagnosis present

## 2019-02-03 DIAGNOSIS — Z885 Allergy status to narcotic agent status: Secondary | ICD-10-CM

## 2019-02-03 DIAGNOSIS — Z79899 Other long term (current) drug therapy: Secondary | ICD-10-CM

## 2019-02-03 DIAGNOSIS — F329 Major depressive disorder, single episode, unspecified: Secondary | ICD-10-CM | POA: Diagnosis present

## 2019-02-03 DIAGNOSIS — N181 Chronic kidney disease, stage 1: Secondary | ICD-10-CM | POA: Diagnosis present

## 2019-02-03 DIAGNOSIS — G9341 Metabolic encephalopathy: Secondary | ICD-10-CM | POA: Diagnosis not present

## 2019-02-03 DIAGNOSIS — Z794 Long term (current) use of insulin: Secondary | ICD-10-CM

## 2019-02-03 DIAGNOSIS — M810 Age-related osteoporosis without current pathological fracture: Secondary | ICD-10-CM | POA: Diagnosis present

## 2019-02-03 DIAGNOSIS — Z90710 Acquired absence of both cervix and uterus: Secondary | ICD-10-CM

## 2019-02-03 DIAGNOSIS — I129 Hypertensive chronic kidney disease with stage 1 through stage 4 chronic kidney disease, or unspecified chronic kidney disease: Secondary | ICD-10-CM | POA: Diagnosis present

## 2019-02-03 DIAGNOSIS — Z7982 Long term (current) use of aspirin: Secondary | ICD-10-CM

## 2019-02-03 DIAGNOSIS — F419 Anxiety disorder, unspecified: Secondary | ICD-10-CM | POA: Diagnosis present

## 2019-02-03 DIAGNOSIS — E876 Hypokalemia: Secondary | ICD-10-CM | POA: Diagnosis present

## 2019-02-03 DIAGNOSIS — J302 Other seasonal allergic rhinitis: Secondary | ICD-10-CM | POA: Diagnosis present

## 2019-02-03 DIAGNOSIS — D869 Sarcoidosis, unspecified: Secondary | ICD-10-CM | POA: Diagnosis present

## 2019-02-03 DIAGNOSIS — H9193 Unspecified hearing loss, bilateral: Secondary | ICD-10-CM | POA: Diagnosis present

## 2019-02-03 DIAGNOSIS — E785 Hyperlipidemia, unspecified: Secondary | ICD-10-CM | POA: Diagnosis present

## 2019-02-03 DIAGNOSIS — K589 Irritable bowel syndrome without diarrhea: Secondary | ICD-10-CM | POA: Diagnosis present

## 2019-02-03 DIAGNOSIS — R441 Visual hallucinations: Secondary | ICD-10-CM | POA: Diagnosis not present

## 2019-02-03 DIAGNOSIS — K219 Gastro-esophageal reflux disease without esophagitis: Secondary | ICD-10-CM | POA: Diagnosis present

## 2019-02-03 DIAGNOSIS — R161 Splenomegaly, not elsewhere classified: Secondary | ICD-10-CM | POA: Diagnosis present

## 2019-02-03 DIAGNOSIS — E1142 Type 2 diabetes mellitus with diabetic polyneuropathy: Secondary | ICD-10-CM | POA: Diagnosis present

## 2019-02-03 DIAGNOSIS — I10 Essential (primary) hypertension: Secondary | ICD-10-CM | POA: Diagnosis present

## 2019-02-03 DIAGNOSIS — Z888 Allergy status to other drugs, medicaments and biological substances status: Secondary | ICD-10-CM

## 2019-02-03 DIAGNOSIS — E869 Volume depletion, unspecified: Secondary | ICD-10-CM | POA: Diagnosis present

## 2019-02-03 DIAGNOSIS — J189 Pneumonia, unspecified organism: Secondary | ICD-10-CM

## 2019-02-03 DIAGNOSIS — Z20822 Contact with and (suspected) exposure to covid-19: Secondary | ICD-10-CM | POA: Diagnosis present

## 2019-02-03 DIAGNOSIS — Z8249 Family history of ischemic heart disease and other diseases of the circulatory system: Secondary | ICD-10-CM

## 2019-02-03 DIAGNOSIS — Z981 Arthrodesis status: Secondary | ICD-10-CM

## 2019-02-03 DIAGNOSIS — Z91040 Latex allergy status: Secondary | ICD-10-CM

## 2019-02-03 DIAGNOSIS — R531 Weakness: Secondary | ICD-10-CM

## 2019-02-03 DIAGNOSIS — G20 Parkinson's disease: Secondary | ICD-10-CM | POA: Diagnosis present

## 2019-02-03 DIAGNOSIS — Z887 Allergy status to serum and vaccine status: Secondary | ICD-10-CM

## 2019-02-03 DIAGNOSIS — N179 Acute kidney failure, unspecified: Secondary | ICD-10-CM

## 2019-02-03 DIAGNOSIS — R0602 Shortness of breath: Secondary | ICD-10-CM

## 2019-02-03 HISTORY — DX: Hypercalcemia: E83.52

## 2019-02-03 LAB — CBC
HCT: 45.1 % (ref 36.0–46.0)
Hemoglobin: 14.5 g/dL (ref 12.0–15.0)
MCH: 29.8 pg (ref 26.0–34.0)
MCHC: 32.2 g/dL (ref 30.0–36.0)
MCV: 92.6 fL (ref 80.0–100.0)
Platelets: 378 10*3/uL (ref 150–400)
RBC: 4.87 MIL/uL (ref 3.87–5.11)
RDW: 12.8 % (ref 11.5–15.5)
WBC: 12.3 10*3/uL — ABNORMAL HIGH (ref 4.0–10.5)
nRBC: 0 % (ref 0.0–0.2)

## 2019-02-03 LAB — POC SARS CORONAVIRUS 2 AG -  ED: SARS Coronavirus 2 Ag: NEGATIVE

## 2019-02-03 LAB — BASIC METABOLIC PANEL
Anion gap: 14 (ref 5–15)
BUN: 50 mg/dL — ABNORMAL HIGH (ref 8–23)
CO2: 19 mmol/L — ABNORMAL LOW (ref 22–32)
Calcium: 14.9 mg/dL (ref 8.9–10.3)
Chloride: 101 mmol/L (ref 98–111)
Creatinine, Ser: 1.88 mg/dL — ABNORMAL HIGH (ref 0.44–1.00)
GFR calc Af Amer: 31 mL/min — ABNORMAL LOW (ref >60)
GFR calc non Af Amer: 27 mL/min — ABNORMAL LOW (ref >60)
Glucose, Bld: 118 mg/dL — ABNORMAL HIGH (ref 70–99)
Potassium: 5.2 mmol/L — ABNORMAL HIGH (ref 3.5–5.1)
Sodium: 134 mmol/L — ABNORMAL LOW (ref 135–145)

## 2019-02-03 LAB — VITAMIN D 25 HYDROXY (VIT D DEFICIENCY, FRACTURES): Vit D, 25-Hydroxy: 64.07 ng/mL (ref 30–100)

## 2019-02-03 LAB — SARS CORONAVIRUS 2 (TAT 6-24 HRS): SARS Coronavirus 2: NEGATIVE

## 2019-02-03 LAB — TSH: TSH: 1.502 u[IU]/mL (ref 0.350–4.500)

## 2019-02-03 LAB — TROPONIN I (HIGH SENSITIVITY)
Troponin I (High Sensitivity): 7 ng/L (ref <18)
Troponin I (High Sensitivity): 7 ng/L (ref <18)

## 2019-02-03 LAB — CBG MONITORING, ED: Glucose-Capillary: 122 mg/dL — ABNORMAL HIGH (ref 70–99)

## 2019-02-03 MED ORDER — SODIUM CHLORIDE 0.9 % IV BOLUS
1500.0000 mL | Freq: Once | INTRAVENOUS | Status: AC
  Administered 2019-02-03: 1500 mL via INTRAVENOUS

## 2019-02-03 MED ORDER — CALCITONIN (SALMON) 200 UNIT/ML IJ SOLN
4.0000 [IU]/kg | Freq: Two times a day (BID) | INTRAMUSCULAR | Status: AC
  Administered 2019-02-04 – 2019-02-05 (×4): 236 [IU] via SUBCUTANEOUS
  Filled 2019-02-03 (×6): qty 1.18

## 2019-02-03 MED ORDER — SODIUM CHLORIDE 0.9% FLUSH
3.0000 mL | Freq: Once | INTRAVENOUS | Status: AC
  Administered 2019-02-03: 3 mL via INTRAVENOUS

## 2019-02-03 MED ORDER — LACTATED RINGERS IV SOLN: INTRAVENOUS | Status: DC

## 2019-02-03 MED ORDER — SODIUM CHLORIDE 0.9 % IV SOLN: INTRAVENOUS | Status: DC

## 2019-02-03 NOTE — ED Notes (Signed)
Pt aware a urine sample is needed, states she just went but will let staff know when she is able.

## 2019-02-03 NOTE — H&P (Deleted)
Destiny Roth 02/03/2019 Sol Blazing MD   Chief Complaint: Gen weakness HPI: The patient is a 69 y.o. year-old with progessive weakness over the past week or so, now can't get OOB so brought to ED.  No fevers, chills, cough, SOB or CP. In ED creat was 1.8 and Ca 14.9, Hb normal, no wt loss she knows of. Asked to see for admission.   Patient denies abd pain, wt loss , n/v, has had some diarrhea.  No CP or SOB or cough, no bone pains, back pains. Loss of feeling in feet due to neuropathy.   Talked w/ son , pt takes 1200 mg Ca daily , also 400 mg Ca in her MVI and taking vit D 1000 u per day.  No hx cancer, DVT.     ROS  denies CP  no joint pain   no HA  no blurry vision  no rash  no nausea/ vomiting  no dysuria  no difficulty voiding  no change in urine color    Past Medical History  Past Medical History:  Diagnosis Date  . Allergy   . Arthritis    neck  . Cataract    bilateral - MD monitoring cataracts  . CHF (congestive heart failure) ([place])   . Chronic kidney disease, stage I    DR OTTELIN  HX UTIS  . Cirrhosis ([place])   . Cramp of limb   . Diabetes mellitus   . Dysphagia, unspecified(787.20)   . Dysuria   . Epistaxis   . GERD (gastroesophageal reflux disease)   . Heart murmur    NO CARDIOLOGIST  DX FOR YEARS ASYMPTOMATIC  . Lumbago   . Neoplasm of uncertain behavior of skin   . Nonspecific elevation of levels of transaminase or lactic acid dehydrogenase (LDH)   . Osteoarthrosis, unspecified whether generalized or localized, unspecified site   . Other and unspecified hyperlipidemia    diet controlled  . Pain in joint, shoulder region   . Paresthesias 12/21/2014  . Postablative ovarian failure   . Trochanteric bursitis of left hip 09/05/2015  . Type 2 diabetes mellitus without complication ([place])   . Unspecified essential hypertension    no meds   Past Surgical History  Past Surgical History:  Procedure  Laterality Date  . BREAST BIOPSY    . CARDIAC CATHETERIZATION N/A 10/18/2015   Procedure: Left Heart Cath and Coronary Angiography;  Surgeon: Belva Crome, MD;  Location: [place] CV LAB;  Service: Cardiovascular;  Laterality: N/A;  . COLONOSCOPY  2012   Dr Lajoyce Corners.   . COLONOSCOPY WITH PROPOFOL N/A 03/28/2016   Procedure: COLONOSCOPY WITH PROPOFOL;  Surgeon: Milus Banister, MD;  Location: [facility] ENDOSCOPY;  Service: Endoscopy;  Laterality: N/A;  . CORONARY ARTERY BYPASS GRAFT N/A 10/19/2015   Procedure: CORONARY ARTERY BYPASS GRAFTING (CABG) x 3 USING RIGHT LEG GREATER SAPHENOUS VEIN GRAFT;  Surgeon: Melrose Nakayama, MD;  Location: [place];  Service: Open Heart Surgery;  Laterality: N/A;  . ENDOVEIN HARVEST OF GREATER SAPHENOUS VEIN Right 10/19/2015   Procedure: ENDOVEIN HARVEST OF GREATER SAPHENOUS VEIN;  Surgeon: Melrose Nakayama, MD;  Location: [place];  Service: Open Heart Surgery;  Laterality: Right;  . ESOPHAGEAL BANDING  12/17/2018   Procedure: ESOPHAGEAL BANDING;  Surgeon: Milus Banister, MD;  Location: [facility]OSCOPY;  Service: Endoscopy;;  . ESOPHAGEAL BANDING  12/27/2018   Procedure: ESOPHAGEAL BANDING;  Surgeon: Juanita Craver, MD;  Location: [place] ENDOSCOPY;  Service: Endoscopy;;  . ESOPHAGOGASTRODUODENOSCOPY N/A 12/27/2018   Procedure: ESOPHAGOGASTRODUODENOSCOPY (EGD);  Surgeon: Juanita Craver, MD;  Location: [facility] ENDOSCOPY;  Service: Endoscopy;  Laterality: N/A;  . ESOPHAGOGASTRODUODENOSCOPY (EGD) WITH PROPOFOL N/A 03/28/2016   Procedure: ESOPHAGOGASTRODUODENOSCOPY (EGD) WITH PROPOFOL;  Surgeon: Milus Banister, MD;  Location: [facility];  Service: Endoscopy;  Laterality: N/A;  . ESOPHAGOGASTRODUODENOSCOPY (EGD) WITH PROPOFOL N/A 12/17/2018   Procedure: ESOPHAGOGASTRODUODENOSCOPY (EGD) WITH PROPOFOL;  Surgeon: Milus Banister, MD;  Location: [facility];  Service: Endoscopy;  Laterality: N/A;  . HEMOSTASIS CLIP PLACEMENT  12/27/2018   Procedure: HEMOSTASIS CLIP PLACEMENT;  Surgeon: Juanita Craver, MD;  Location: [place] ENDOSCOPY;  Service: Endoscopy;;  . IR ANGIOGRAM SELECTIVE EACH ADDITIONAL VESSEL  12/28/2018  . IR EMBO ART  VEN HEMORR LYMPH EXTRAV  INC GUIDE ROADMAPPING  12/28/2018  . IR PARACENTESIS  12/28/2018  . IR TIPS  12/28/2018  . MAXIMUM ACCESS (MAS)POSTERIOR LUMBAR INTERBODY FUSION (PLIF) 1 LEVEL Left 03/01/2015   Procedure: FOR MAXIMUM ACCESS (MAS) POSTERIOR LUMBAR INTERBODY FUSION (PLIF) LUMBAR THREE-FOUR EXTRAFORAMINAL MICRODISCECTOMY LUMBAR FIVE-SACRAL ONE LEFT;  Surgeon: Eustace Moore, MD;  Location: [place] NEURO ORS;  Service: Neurosurgery;  Laterality: Left;  . RADIOLOGY WITH ANESTHESIA N/A 12/28/2018   Procedure: RADIOLOGY WITH ANESTHESIA;  Surgeon: Radiologist, Medication, MD;  Location: [place];  Service: Radiology;  Laterality: N/A;  . SCLEROTHERAPY  12/27/2018   Procedure: SCLEROTHERAPY;  Surgeon: Juanita Craver, MD;  Location: [facility] ENDOSCOPY;  Service: Endoscopy;;  . TEE WITHOUT CARDIOVERSION N/A 10/19/2015   Procedure: TRANSESOPHAGEAL ECHOCARDIOGRAM (TEE);  Surgeon: Melrose Nakayama, MD;  Location: [place];  Service: Open Heart Surgery;  Laterality: N/A;  . TUBAL LIGATION  1982   Dr Connye Burkitt  . UPPER GASTROINTESTINAL ENDOSCOPY    . VAGINAL HYSTERECTOMY  1997   Dr Rande Lawman   Family History  Family History  Problem Relation Age of Onset  . Lung cancer Father   . Arthritis Sister   . Arthritis Brother   . Heart disease Maternal Grandmother   . Heart disease Maternal Grandfather   . Heart disease Paternal Grandmother   . Heart disease Paternal Grandfather   . Breast cancer Mother   . Liver cancer Brother   . Breast cancer Maternal Aunt   . Breast cancer Paternal Aunt   . Colon cancer Neg Hx   . Esophageal cancer Neg Hx   . Rectal cancer Neg Hx   . Stomach cancer Neg Hx    Social History  reports that she has never smoked. She has never used smokeless tobacco. She reports that she does not drink alcohol or use drugs. Allergies  Allergies  Allergen Reactions   . Kiwi Extract Anaphylaxis  . Tdap [Tetanus-Diphth-Acell Pertussis] Swelling and Other (See Comments)    Swelling at injection site, gets very hot  . Statins     RHABDOMYOLYSIS  . Latex Itching, Dermatitis and Rash  . Tramadol Nausea And Vomiting   Home medications Prior to Admission medications   Medication Sig Start Date End Date Taking? Authorizing Provider  acetaminophen (TYLENOL) 500 MG tablet Take 500 mg by mouth at bedtime.     [provider]  Aromatic Inhalants (VICKS VAPOR IN) Vicks Vapor Rub apply small amount to outside of nose to help breathing    [provider]  BD PEN NEEDLE NANO U/F 32G X 4 MM MISC USE THREE TIMES DAILY AS DIRECTED 08/17/18   Reed, Tiffany L, DO  Biotin 10000 MCG TABS Take  10,000 mcg by mouth every morning.    [provider]  bisacodyl (DULCOLAX) 10 MG suppository Place 1 suppository (10 mg total) rectally daily as needed for moderate constipation. 01/01/19   Aline August, MD  calcium carbonate (OS-CAL) 600 MG TABS Take 600 mg by mouth 2 (two) times daily with a meal.      [provider]  Cholecalciferol (VITAMIN D) 50 MCG (2000 UT) CAPS Take 2,000 Units by mouth daily.     [provider]  Cyanocobalamin (VITAMIN B 12 PO) Take 1,000 mcg by mouth daily.      [provider]  ezetimibe (ZETIA) 10 MG tablet TAKE 1 TABLET(10 MG) BY MOUTH DAILY 09/16/18   Reed, Tiffany L, DO  furosemide (LASIX) 40 MG tablet Take 40 mg by mouth daily.     [provider]  glucose blood test strip One Touch Ultra II strips. Use to test blood sugar three times daily. Dx: E11.65 02/20/17   Reed, Tiffany L, DO  insulin detemir (LEVEMIR) 100 UNIT/ML injection Inject 0.2 mLs (20 Units total) into the skin at bedtime. 01/02/19   Aline August, MD  Insulin Syringe-Needle U-100 (INSULIN SYRINGE 1CC/31GX5/16") 31G X 5/16" 1 ML MISC USE AS DIRECTED DAILY WITH LEVEMIR 04/17/18   Reed, Tiffany L, DO  JARDIANCE 25 MG TABS tablet  Take 25 mg by mouth daily. 01/19/19   [provider]  lactulose (CHRONULAC) 10 GM/15ML solution Take 20 g by mouth 3 (three) times daily. 01/23/19   [provider]  loratadine (CLARITIN) 10 MG tablet Take 10 mg by mouth daily as needed for allergies.    [provider]  MAGNESIUM PO Take 500 mg by mouth 2 (two) times daily in the am and at bedtime..    [provider]  Multiple Vitamins-Minerals (MULTIVITAMIN WITH MINERALS) tablet Take 1 tablet by mouth daily.      [provider]  NOVOLOG FLEXPEN 100 UNIT/ML FlexPen Inject 12 units under the skin every morning, 8 units at lunch and 12 units at supper 11/10/18   Reed, Tiffany L, DO  ondansetron (ZOFRAN) 4 MG tablet Take 1 tablet (4 mg total) by mouth every 6 (six) hours as needed for nausea. 01/01/19   Aline August, MD  pantoprazole (PROTONIX) 40 MG tablet Take 1 tablet (40 mg total) by mouth 2 (two) times daily. 01/01/19   Aline August, MD  Polyethyl Glycol-Propyl Glycol (SYSTANE OP) Place 1 drop into both eyes 2 (two) times daily.    [provider]  Probiotic Product (PROBIOTIC DAILY PO) Take 1 capsule by mouth daily. Digestive Advantage Probiotic    [provider]  spironolactone (ALDACTONE) 50 MG tablet Take 1 tablet (50 mg total) by mouth 2 (two) times daily. 01/04/19   Gayland Curry, DO   Liver Function Tests Recent Labs  Lab 02/01/19 1436 02/03/19 1042  AST 58* 62*  ALT 41* 45*  ALKPHOS  --  127*  BILITOT 3.5* 3.4*  PROT 7.8 7.2  ALBUMIN  --  3.0*   Recent Labs  Lab 02/03/19 1042  LIPASE 29   CBC Recent Labs  Lab 02/01/19 1436 02/03/19 1042 02/03/19 1056  WBC 10.4 7.7  --   NEUTROABS 8,299* 5.9  --   HGB 16.1* 14.9 15.0  HCT 47.2* 43.4 44.0  MCV 92.5 94.6  --   PLT 151 116*  --    Basic Metabolic Panel Recent Labs  Lab 02/01/19 1436 02/03/19 1042 02/03/19 1056  NA 133*  131* 133*  K 4.5 4.6 4.6  CL 96* 97*  --   CO2 24 23  --   GLUCOSE 269*  138*  --   BUN 19 20  --   CREATININE 1.05* 1.13*  --   CALCIUM 11.1* 10.0  --    Iron/TIBC/Ferritin/ %Sat    Component Value Date/Time   IRON 29 01/27/2016 1422   TIBC 427 01/27/2016 1422   FERRITIN 13 01/27/2016 1422   IRONPCTSAT 7 (L) 01/27/2016 1422    Vitals:   02/03/19 1018 02/03/19 1030 02/03/19 1045 02/03/19 1215  BP: (!) 123/51 (!) 101/46  (!) 125/54  Pulse: 89 83    Resp: 16 15  20   Temp: 97.8 F (36.6 C)  97.9 F (36.6 C)   TempSrc: Oral  Rectal   SpO2: 99% 98%      Exam Gen alert , Ox 3 w/ guidance No rash, cyanosis or gangrene Sclera anicteric, throat clear and slightly dry  No jvd or bruits Chest clear bilat to bases RRR no MRG Abd soft ntnd no mass or ascites +bs GU defer MS no joint effusions or deformity Ext no LE or UE edema, no wounds or ulcers Neuro is alert, Ox 3 , nf   Assessment/ Plan: 1. Hypercalcemia - unlikely cancer in patient w/o prior history.  Is on Ca++ supplements she thinks were increased 6 mos ago (1200 mg Ca), also Ca++ in her MVI and also taking vit D. She is not anemic to suggest myeloma, but will send off serologies. She is on HCTZ, there may be cycle of vol depletion/ Ca overload and AKI.  Plan is hold diuretics and all Ca++/ vit D for now, give NS 125/hr + bolus, send pth/ pth-rp, vit D and f/u labs in am.   2. AKI - creat 1.8, looks dry, as above give IVF's and hold HCTZ 3. Parkinson's - on 5 /day sinemet, amantadine 4. Depression - cont meds 5. HTN - hold thiazide      Kelly Splinter, MD   [facility] 02/03/2019, 1:10 PM

## 2019-02-03 NOTE — ED Provider Notes (Addendum)
[facility] EMERGENCY DEPARTMENT Provider Note   CSN: [id number] Arrival date & time: 02/03/19  1049     History Chief Complaint  Patient presents with  . Weakness  . Cough    Destiny Roth is a 69 y.o. female.  HPI     69 year old comes in a chief complaint of cough and weakness. Patient has history of hypertension, hyperlipidemia, IBS. Patient reports that over the past 2 or 3 days she has been having increasing weakness, to the point where it was difficult for her to get out of the bed today.  Review of system is positive for a new cough, reduced appetite.  Patient denies any night sweats, fevers, chills  Past Medical History:  Diagnosis Date  . Abdominal pain, epigastric 09/18/2009  . Anosmia 11/06/2010  . ANXIETY 09/23/2006  . BUNION, RIGHT FOOT 09/18/2009  . CARPAL TUNNEL SYNDROME, BILATERAL 09/23/2006  . DEPRESSION 09/23/2006  . DIVERTICULOSIS, COLON 09/23/2006  . GERD 05/24/2008  . GLUCOSE INTOLERANCE 05/24/2008  . Hemorrhoids   . HYPERLIPIDEMIA 09/23/2006  . HYPERTENSION 09/23/2006  . IBS 09/23/2006  . Impaired glucose tolerance 11/04/2010  . OSTEOPOROSIS 05/24/2008  . Parkinson's disease ([place])   . Restrictive lung disease 12/20/2013    Patient Active Problem List   Diagnosis Date Noted  . Vertigo 03/12/2016  . Chest congestion 02/24/2015  . Peripheral edema 07/09/2014  . Restrictive lung disease 12/20/2013  . DOE (dyspnea on exertion) 11/18/2013  . Hot flashes 11/18/2013  . Chest pain 08/04/2013  . Memory impairment 08/04/2013  . Orthostasis 02/26/2013  . Tachycardia 02/24/2013  . Coronary artery calcification seen on CAT scan 01/01/2013  . Tendonitis, calcific, shoulder 01/01/2013  . OSA (obstructive sleep apnea) 01/01/2013  . PD (Parkinson's disease) ([place]) 11/26/2011  . Hematochezia 11/09/2011  . Colon polyps 11/09/2011  . IBS (irritable bowel syndrome) 11/09/2011  . Anosmia 11/06/2010  . Impaired glucose tolerance 11/04/2010    . Hearing loss 05/07/2010  . Preventative health care 05/07/2010  . BUNION, RIGHT FOOT 09/18/2009  . GERD 05/24/2008  . OSTEOPOROSIS 05/24/2008  . Hyperlipidemia 09/23/2006  . Anxiety state 09/23/2006  . Depression 09/23/2006  . CARPAL TUNNEL SYNDROME, BILATERAL 09/23/2006  . Essential hypertension 09/23/2006  . DIVERTICULOSIS, COLON 09/23/2006    Past Surgical History:  Procedure Laterality Date  . CARDIAC CATHETERIZATION    . CATARACT EXTRACTION    . COLONOSCOPY W/ BIOPSIES  2008   Dr. Collene Mares -negative random bxs  . ESOPHAGOGASTRODUODENOSCOPY  2008   Dr. Harriette Bouillon duodenal ulcer  . FOOT SURGERY Bilateral 2015  . LEFT AND RIGHT HEART CATHETERIZATION WITH CORONARY ANGIOGRAM N/A 11/09/2013   Procedure: LEFT AND RIGHT HEART CATHETERIZATION WITH CORONARY ANGIOGRAM;  Surgeon: Peter M Martinique, MD;  Location: [facility] CATH LAB;  Service: Cardiovascular;  Laterality: N/A;  . nasal septum repair    . s/p bilat CTS    . s/p fibroid ablation    . s/p ganglion cyst right wrist    . s/p right thumb trigger finger surgury    . SHOULDER SURGERY Right 2015  . TUBAL LIGATION       OB History    Gravida  3   Para  2   Term  2   Preterm      AB  1   Living  2     SAB      TAB  1   Ectopic      Multiple      Live Births  2           Family History  Problem Relation Age of Onset  . Peripheral vascular disease Mother        with bilat amputations  . Heart attack Mother   . Hypertension Father   . Heart disease Father 57  . Hypertension Brother   . Heart attack Brother   . Hypertension Brother   . Hypertension Brother     Social History   Tobacco Use  . Smoking status: Never Smoker  . Smokeless tobacco: Never Used  Substance Use Topics  . Alcohol use: Yes    Comment: Occ  . Drug use: No    Home Medications Prior to Admission medications   Medication Sig Start Date End Date Taking? Authorizing Provider  aspirin 81 MG tablet Take 81 mg by mouth daily.     [provider]  Calcium Carbonate-Vit D-Min (CALCIUM 1200 PO) Take by mouth daily.    [provider]  carbidopa-levodopa (SINEMET IR) 25-100 MG tablet TAKE ONE TABLET BY MOUTH THREE TIMES DAILY 10/26/15   Tat, Eustace Quail, DO  Carbidopa-Levodopa ER (SINEMET CR) 25-100 MG tablet controlled release Take 1 tablet by mouth at bedtime. 03/07/15   Tat, Eustace Quail, DO  cholecalciferol (VITAMIN D) 1000 units tablet Take 1,000 Units by mouth daily.    [provider]  dicyclomine (BENTYL) 20 MG tablet Take 1 tablet (20 mg total) by mouth every 6 (six) hours as needed for spasms. 04/04/16   Irene Shipper, MD  dicyclomine (BENTYL) 20 MG tablet TAKE 1 TABLET EVERY 6 HOURS AS NEEDED FOR SPASMS 10/18/16   Irene Shipper, MD  escitalopram (LEXAPRO) 20 MG tablet Take 1 tablet (20 mg total) by mouth daily. 01/14/17   Biagio Borg, MD  hydrochlorothiazide (HYDRODIURIL) 25 MG tablet Take 1 tablet (25 mg total) by mouth daily. Yearly physical is due must see MD for future refills 02/05/16   Biagio Borg, MD  hydrocortisone (ANUSOL-HC) 25 MG suppository Place 1 suppository (25 mg total) rectally at bedtime as needed for hemorrhoids or itching. 02/21/16   Irene Shipper, MD  meclizine (ANTIVERT) 25 MG tablet Take 1 tablet (25 mg total) by mouth 4 (four) times daily as needed for dizziness. 03/12/16   Golden Circle, FNP  Multiple Vitamins-Calcium (ONE-A-DAY WOMENS PO) Take 1 tablet by mouth daily.    [provider]  potassium chloride (K-DUR) 10 MEQ tablet Take 1 tablet (10 mEq total) by mouth daily. Yearly physical is due in must see MD for future refills 02/05/16   Biagio Borg, MD  rOPINIRole (REQUIP) 2 MG tablet TAKE TWO TABLETS BY MOUTH IN THE MORNING TAKE  ONE  TABLET  IN  THE  AFTERNOON  AND  TAKE  ONE  TABLET  IN  THE  EVENING Patient taking differently: one three times daily 10/23/15   Tat, Eustace Quail, DO    Allergies    Lipitor [atorvastatin] and Zocor [simvastatin]  Review of  Systems   Review of Systems  Constitutional: Positive for activity change.  Respiratory: Negative for shortness of breath.   Cardiovascular: Negative for chest pain.  Gastrointestinal: Negative for nausea and vomiting.  Musculoskeletal: Positive for arthralgias and myalgias.  Allergic/Immunologic: Negative for immunocompromised state.  Neurological: Positive for weakness. Negative for numbness.  Hematological: Does not bruise/bleed easily.  All other systems reviewed and are negative.   Physical Exam Updated Vital Signs BP 108/87   Pulse 89  Temp 97.8 F (36.6 C) (Oral)   Resp (!) 33   SpO2 95%   Physical Exam Vitals and nursing note reviewed.  Constitutional:      Appearance: She is well-developed.  HENT:     Head: Normocephalic and atraumatic.  Cardiovascular:     Rate and Rhythm: Normal rate.  Pulmonary:     Effort: Pulmonary effort is normal.  Abdominal:     General: Bowel sounds are normal.  Musculoskeletal:     Cervical back: Normal range of motion and neck supple.  Skin:    General: Skin is warm and dry.  Neurological:     Mental Status: She is alert and oriented to person, place, and time.     ED Results / Procedures / Treatments   Labs (all labs ordered are listed, but only abnormal results are displayed) Labs Reviewed  BASIC METABOLIC PANEL - Abnormal; Notable for the following components:      Result Value   Sodium 134 (*)    Potassium 5.2 (*)    CO2 19 (*)    Glucose, Bld 118 (*)    BUN 50 (*)    Creatinine, Ser 1.88 (*)    Calcium 14.9 (*)    GFR calc non Af Amer 27 (*)    GFR calc Af Amer 31 (*)    All other components within normal limits  CBC - Abnormal; Notable for the following components:   WBC 12.3 (*)    All other components within normal limits  CBG MONITORING, ED - Abnormal; Notable for the following components:   Glucose-Capillary 122 (*)    All other components within normal limits  SARS CORONAVIRUS 2 (TAT 6-24 HRS)    URINALYSIS, ROUTINE W REFLEX MICROSCOPIC  POC SARS CORONAVIRUS 2 AG -  ED  TROPONIN I (HIGH SENSITIVITY)  TROPONIN I (HIGH SENSITIVITY)    EKG EKG Interpretation  Date/Time:  Wednesday February 03 2019 11:14:39 EST Ventricular Rate:  99 PR Interval:  136 QRS Duration: 76 QT Interval:  328 QTC Calculation: 420 R Axis:   37 Text Interpretation: Normal sinus rhythm Nonspecific T wave abnormality Abnormal ECG No acute changes Confirmed by Varney Biles [phone]) on 02/03/2019 1:34:49 PM   Radiology No results found.  Procedures .Critical Care Performed by: Varney Biles, MD Authorized by: Varney Biles, MD   Critical care provider statement:    Critical care time (minutes):  38   Critical care was necessary to treat or prevent imminent or life-threatening deterioration of the following conditions:  Metabolic crisis and renal failure   Critical care was time spent personally by me on the following activities:  Discussions with consultants, evaluation of patient's response to treatment, examination of patient, ordering and performing treatments and interventions, ordering and review of laboratory studies, ordering and review of radiographic studies, pulse oximetry, re-evaluation of patient's condition, obtaining history from patient or surrogate and review of old charts   (including critical care time)  Medications Ordered in ED Medications  sodium chloride flush (NS) 0.9 % injection 3 mL (has no administration in time range)    ED Course  I have reviewed the triage vital signs and the nursing notes.  Pertinent labs & imaging results that were available during my care of the patient were reviewed by me and considered in my medical decision making (see chart for details).  Clinical Course as of Feb 03 1536  Wed Feb 03, 2019  1532 Patient is noted to have significant hypercalcemia.  EKG  is not showing any changes associated with it.   Calcium(!!): 14.9 [AN]  1532 Patient  has AKI along with mild hyperkalemia.  Creatinine(!): 1.88 [AN]  1533 It is unclear why there is hypercalcemia.  Patient denies any night sweats, fevers, chills, cancer history.  She is on vitamin D-calcium supplements and is noted to have AKI which could be contributing.  Calcium(!!): 14.9 [AN]  48 Spoke with husband. Reports that patient has been slowly declining over the last 3 weeks. Each day she is progressively getting more and more tired and now having trouble walking. Amantadine was started a month ago.    [AN]    Clinical Course User Index [AN] Varney Biles, MD   MDM Rules/Calculators/A&P                     [name] was evaluated in Emergency Department on 02/03/2019 for the symptoms described in the history of present illness. She was evaluated in the context of the global COVID-19 pandemic, which necessitated consideration that the patient might be at risk for infection with the SARS-CoV-2 virus that causes COVID-19. Institutional protocols and algorithms that pertain to the evaluation of patients at risk for COVID-19 are in a state of rapid change based on information released by regulatory bodies including the CDC and federal and state organizations. These policies and algorithms were followed during the patient's care in the ED.   69 year old comes in a chief complaint of weakness. Patient is having difficulty ambulating.  She reports a cough that is new.  Differential diagnosis includes electrolyte abnormalities, COVID-19, UTI.  3:38 PM Negative Trousseau's and show [place] sign Ready for admission.  Final Clinical Impression(s) / ED Diagnoses Final diagnoses:  Hypercalcemia  Generalized weakness  AKI (acute kidney injury) [facility])    Rx / DC Orders ED Discharge Orders    None       Varney Biles, MD 02/03/19 [place], [place], MD 02/03/19 1538

## 2019-02-03 NOTE — ED Notes (Signed)
Please call for updates  Cynda Familia -daughter [phone] Herbie Baltimoreson [phone]

## 2019-02-03 NOTE — ED Notes (Signed)
Calcium 14.9 per lab.  Dr. Kathrynn Humble notified.

## 2019-02-03 NOTE — ED Triage Notes (Signed)
To triage via EMS.  Pt reports 1 week of weakness, NP cough.  Decreased appetite.  Taking medications OK.   EMS  bp 110/98 HR 95 RR 18 SpO2 97%  CBG 118

## 2019-02-03 NOTE — ED Notes (Signed)
MD aware son is requesting an update.

## 2019-02-03 NOTE — ED Notes (Signed)
Pt placed on purewick 

## 2019-02-04 ENCOUNTER — Encounter (HOSPITAL_COMMUNITY): Payer: Self-pay | Admitting: Nephrology

## 2019-02-04 DIAGNOSIS — I10 Essential (primary) hypertension: Secondary | ICD-10-CM

## 2019-02-04 DIAGNOSIS — F329 Major depressive disorder, single episode, unspecified: Secondary | ICD-10-CM | POA: Diagnosis present

## 2019-02-04 DIAGNOSIS — Z20822 Contact with and (suspected) exposure to covid-19: Secondary | ICD-10-CM | POA: Diagnosis present

## 2019-02-04 DIAGNOSIS — E1142 Type 2 diabetes mellitus with diabetic polyneuropathy: Secondary | ICD-10-CM | POA: Diagnosis present

## 2019-02-04 DIAGNOSIS — E869 Volume depletion, unspecified: Secondary | ICD-10-CM | POA: Diagnosis present

## 2019-02-04 DIAGNOSIS — M810 Age-related osteoporosis without current pathological fracture: Secondary | ICD-10-CM | POA: Diagnosis present

## 2019-02-04 DIAGNOSIS — E876 Hypokalemia: Secondary | ICD-10-CM | POA: Diagnosis present

## 2019-02-04 DIAGNOSIS — R161 Splenomegaly, not elsewhere classified: Secondary | ICD-10-CM | POA: Diagnosis present

## 2019-02-04 DIAGNOSIS — F419 Anxiety disorder, unspecified: Secondary | ICD-10-CM | POA: Diagnosis present

## 2019-02-04 DIAGNOSIS — Z8249 Family history of ischemic heart disease and other diseases of the circulatory system: Secondary | ICD-10-CM | POA: Diagnosis not present

## 2019-02-04 DIAGNOSIS — N179 Acute kidney failure, unspecified: Secondary | ICD-10-CM

## 2019-02-04 DIAGNOSIS — I129 Hypertensive chronic kidney disease with stage 1 through stage 4 chronic kidney disease, or unspecified chronic kidney disease: Secondary | ICD-10-CM | POA: Diagnosis present

## 2019-02-04 DIAGNOSIS — H9193 Unspecified hearing loss, bilateral: Secondary | ICD-10-CM | POA: Diagnosis present

## 2019-02-04 DIAGNOSIS — N181 Chronic kidney disease, stage 1: Secondary | ICD-10-CM | POA: Diagnosis present

## 2019-02-04 DIAGNOSIS — R531 Weakness: Secondary | ICD-10-CM | POA: Diagnosis not present

## 2019-02-04 DIAGNOSIS — K219 Gastro-esophageal reflux disease without esophagitis: Secondary | ICD-10-CM | POA: Diagnosis present

## 2019-02-04 DIAGNOSIS — G20 Parkinson's disease: Secondary | ICD-10-CM | POA: Diagnosis present

## 2019-02-04 DIAGNOSIS — J302 Other seasonal allergic rhinitis: Secondary | ICD-10-CM | POA: Diagnosis present

## 2019-02-04 DIAGNOSIS — Z7982 Long term (current) use of aspirin: Secondary | ICD-10-CM | POA: Diagnosis not present

## 2019-02-04 DIAGNOSIS — K589 Irritable bowel syndrome without diarrhea: Secondary | ICD-10-CM | POA: Diagnosis present

## 2019-02-04 DIAGNOSIS — G9341 Metabolic encephalopathy: Secondary | ICD-10-CM | POA: Diagnosis not present

## 2019-02-04 DIAGNOSIS — E785 Hyperlipidemia, unspecified: Secondary | ICD-10-CM | POA: Diagnosis present

## 2019-02-04 DIAGNOSIS — J189 Pneumonia, unspecified organism: Secondary | ICD-10-CM | POA: Diagnosis not present

## 2019-02-04 DIAGNOSIS — G4733 Obstructive sleep apnea (adult) (pediatric): Secondary | ICD-10-CM | POA: Diagnosis present

## 2019-02-04 DIAGNOSIS — R441 Visual hallucinations: Secondary | ICD-10-CM | POA: Diagnosis not present

## 2019-02-04 LAB — RENAL FUNCTION PANEL
Albumin: 2.3 g/dL — ABNORMAL LOW (ref 3.5–5.0)
Anion gap: 9 (ref 5–15)
BUN: 47 mg/dL — ABNORMAL HIGH (ref 8–23)
CO2: 25 mmol/L (ref 22–32)
Calcium: 12.4 mg/dL — ABNORMAL HIGH (ref 8.9–10.3)
Chloride: 104 mmol/L (ref 98–111)
Creatinine, Ser: 1.67 mg/dL — ABNORMAL HIGH (ref 0.44–1.00)
GFR calc Af Amer: 36 mL/min — ABNORMAL LOW (ref >60)
GFR calc non Af Amer: 31 mL/min — ABNORMAL LOW (ref >60)
Glucose, Bld: 80 mg/dL (ref 70–99)
Phosphorus: 2.8 mg/dL (ref 2.5–4.6)
Potassium: 4.6 mmol/L (ref 3.5–5.1)
Sodium: 138 mmol/L (ref 135–145)

## 2019-02-04 LAB — CBC
HCT: 38.9 % (ref 36.0–46.0)
Hemoglobin: 12.3 g/dL (ref 12.0–15.0)
MCH: 29.4 pg (ref 26.0–34.0)
MCHC: 31.6 g/dL (ref 30.0–36.0)
MCV: 92.8 fL (ref 80.0–100.0)
Platelets: 301 10*3/uL (ref 150–400)
RBC: 4.19 MIL/uL (ref 3.87–5.11)
RDW: 13 % (ref 11.5–15.5)
WBC: 8.2 10*3/uL (ref 4.0–10.5)
nRBC: 0 % (ref 0.0–0.2)

## 2019-02-04 LAB — KAPPA/LAMBDA LIGHT CHAINS
Kappa free light chain: 78 mg/L — ABNORMAL HIGH (ref 3.3–19.4)
Kappa, lambda light chain ratio: 1.45 (ref 0.26–1.65)
Lambda free light chains: 53.8 mg/L — ABNORMAL HIGH (ref 5.7–26.3)

## 2019-02-04 LAB — URINALYSIS, ROUTINE W REFLEX MICROSCOPIC
Bilirubin Urine: NEGATIVE
Glucose, UA: NEGATIVE mg/dL
Hgb urine dipstick: NEGATIVE
Ketones, ur: 5 mg/dL — AB
Nitrite: NEGATIVE
Protein, ur: NEGATIVE mg/dL
Specific Gravity, Urine: 1.017 (ref 1.005–1.030)
pH: 5 (ref 5.0–8.0)

## 2019-02-04 LAB — PROTEIN ELECTROPHORESIS, SERUM
A/G Ratio: 0.8 (ref 0.7–1.7)
Albumin ELP: 2.8 g/dL — ABNORMAL LOW (ref 2.9–4.4)
Alpha-1-Globulin: 0.5 g/dL — ABNORMAL HIGH (ref 0.0–0.4)
Alpha-2-Globulin: 1.3 g/dL — ABNORMAL HIGH (ref 0.4–1.0)
Beta Globulin: 0.7 g/dL (ref 0.7–1.3)
Gamma Globulin: 0.9 g/dL (ref 0.4–1.8)
Globulin, Total: 3.4 g/dL (ref 2.2–3.9)
Total Protein ELP: 6.2 g/dL (ref 6.0–8.5)

## 2019-02-04 LAB — CBG MONITORING, ED: Glucose-Capillary: 93 mg/dL (ref 70–99)

## 2019-02-04 LAB — PARATHYROID HORMONE, INTACT (NO CA): PTH: 5 pg/mL — ABNORMAL LOW (ref 15–65)

## 2019-02-04 LAB — HIV ANTIBODY (ROUTINE TESTING W REFLEX): HIV Screen 4th Generation wRfx: NONREACTIVE

## 2019-02-04 MED ORDER — CARBIDOPA-LEVODOPA ER 25-100 MG PO TBCR: 1.0000 | EXTENDED_RELEASE_TABLET | Freq: Every day | ORAL | Status: DC

## 2019-02-04 MED ORDER — DICYCLOMINE HCL 20 MG PO TABS
20.0000 mg | ORAL_TABLET | Freq: Four times a day (QID) | ORAL | Status: DC | PRN
  Filled 2019-02-04: qty 1

## 2019-02-04 MED ORDER — CARBIDOPA-LEVODOPA ER 25-100 MG PO TBCR
1.0000 | EXTENDED_RELEASE_TABLET | ORAL | Status: DC
  Administered 2019-02-04 – 2019-02-11 (×21): 1 via ORAL
  Filled 2019-02-04 (×27): qty 1

## 2019-02-04 MED ORDER — ENOXAPARIN SODIUM 30 MG/0.3ML ~~LOC~~ SOLN
30.0000 mg | SUBCUTANEOUS | Status: DC
  Administered 2019-02-04 – 2019-02-05 (×2): 30 mg via SUBCUTANEOUS
  Filled 2019-02-04 (×2): qty 0.3

## 2019-02-04 MED ORDER — CARBIDOPA-LEVODOPA ER 25-100 MG PO TBCR
2.0000 | EXTENDED_RELEASE_TABLET | ORAL | Status: DC
  Administered 2019-02-04 – 2019-02-11 (×15): 2 via ORAL
  Filled 2019-02-04 (×17): qty 2

## 2019-02-04 MED ORDER — POTASSIUM CHLORIDE ER 10 MEQ PO TBCR: 10.0000 meq | EXTENDED_RELEASE_TABLET | Freq: Every day | ORAL | Status: DC

## 2019-02-04 MED ORDER — CARBIDOPA-LEVODOPA 25-100 MG PO TABS
1.0000 | ORAL_TABLET | ORAL | Status: DC
  Administered 2019-02-04 – 2019-02-11 (×36): 1 via ORAL
  Filled 2019-02-04 (×35): qty 1

## 2019-02-04 MED ORDER — ASPIRIN 81 MG PO CHEW
81.0000 mg | CHEWABLE_TABLET | Freq: Every day | ORAL | Status: DC
  Administered 2019-02-04 – 2019-02-11 (×8): 81 mg via ORAL
  Filled 2019-02-04 (×8): qty 1

## 2019-02-04 MED ORDER — ROPINIROLE HCL 0.5 MG PO TABS
1.0000 mg | ORAL_TABLET | Freq: Three times a day (TID) | ORAL | Status: DC
  Administered 2019-02-04 – 2019-02-11 (×22): 1 mg via ORAL
  Filled 2019-02-04: qty 1
  Filled 2019-02-04 (×5): qty 2
  Filled 2019-02-04: qty 1
  Filled 2019-02-04 (×7): qty 2
  Filled 2019-02-04: qty 1
  Filled 2019-02-04 (×8): qty 2

## 2019-02-04 NOTE — ED Notes (Signed)
Admitting doctor at bedside 

## 2019-02-04 NOTE — ED Notes (Signed)
Helped transfer patient to a hospital bed patient is resting with call bell in reach

## 2019-02-04 NOTE — ED Notes (Signed)
Pt given italian ice to eat, states she does not feel up to eating a full meal at this time

## 2019-02-04 NOTE — Progress Notes (Signed)
PROGRESS NOTE    Destiny Roth  [id number] DOB: 06-18-1950 DOA: 02/03/2019 PCP: No primary care provider on file.  Outpatient Specialists:   Brief Narrative:  Patient is a 69 year old female with past medical history significant for Parkinson's disease, restrictive lung disease, impaired glucose tolerance, IBS, hypertension, hyperlipidemia, diverticulosis and GERD. Patient was admitted with progressive weakness. On presentation to the hospital, patient was found to have calcium of 14.9, serum creatinine of 1.8, with associated volume depletion. Patient was on calcium supplement, vitamin D and multivitamin prior to presentation. Patient also reported associated diarrhea. Patient is currently on normal saline 125 cc/h. Patient has also on calcitonin. Work-up for the hypercalcemia is in progress. Intact PTH is 5.  02/04/2019: Calcium is down to 12.4, serum creatinine is down to 1.67. With hydration, hemoglobin has come down from 14.5 g/dL to 12.3 g/dL. Work-up for the possible cause of hypercalcemia is in progress (suspect multifactorial, cannot entirely rule out underlying malignancy). Patient remains very weak, and mildly lethargic.  Assessment & Plan:   Active Problems:   Hypercalcemia  Hypercalcemia: -Likely multifactorial. -Cannot rule out underlying malignancy. -Complete work-up. -Further myeloma work-up. -Low threshold for further imaging studies. -Continue to hold calcium supplements and vitamin D. -Continue aggressive hydration. -Monitor calcium level. -Further management will depend on hospital course.  Acute kidney injury: -Suspect prerenal. -Cannot rule out impact from hypercalcemia. -Continue to monitor renal function. -Further management depend on hospital course.  Parkinson's disease: -Continue 5 /day sinemet, amantadine  Volume depletion: -Continue aggressive hydration. -Monitor closely.  Weakness and lethargy: -Likely secondary to volume depletion and  hypercalcemia. -Continue to monitor closely. -Physical therapy consult.  Essential hypertension: -Continue to monitor and optimize. -Goal blood pressure should be below 130/80 mmHg. -Continue to hold HCTZ.  DVT prophylaxis: Subcutaneous Lovenox Code Status: Full code Family Communication:  Disposition Plan: This will depend on hospital course  Consultants:   None  Procedures:   None  Antimicrobials:   None   Subjective: Patient remains very weak.  Objective: Vitals:   02/04/19 0827 02/04/19 0900 02/04/19 1000 02/04/19 1100  BP: 140/73 (!) 161/82 (!) 145/106 (!) 152/85  Pulse: 83 88 88 85  Resp: (!) 24 18 18 19   Temp:      TempSrc:      SpO2: 97% 97% 96% 100%  Weight:      Height:        Intake/Output Summary (Last 24 hours) at 02/04/2019 1150 Last data filed at 02/03/2019 2150 Gross per 24 hour  Intake 1500 ml  Output --  Net 1500 ml   Filed Weights   02/03/19 1815 02/04/19 0200  Weight: 59 kg 59 kg    Examination:  General exam: Appears weak and lethargic.  Respiratory system: Clear to auscultation. Respiratory effort normal. Cardiovascular system: S1 & S2  Gastrointestinal system: Abdomen is nondistended, soft and nontender. No organomegaly or masses felt. Normal bowel sounds heard. Central nervous system: Awake and mildly lethargic. Moves all extremities.  Extremities: No leg edema  Data Reviewed: I have personally reviewed following labs and imaging studies  CBC: Recent Labs  Lab 02/03/19 1127 02/04/19 0446  WBC 12.3* 8.2  HGB 14.5 12.3  HCT 45.1 38.9  MCV 92.6 92.8  PLT 378 [id number]   Basic Metabolic Panel: Recent Labs  Lab 02/03/19 1127 02/04/19 0446  NA 134* 138  K 5.2* 4.6  CL 101 104  CO2 19* 25  GLUCOSE 118* 80  BUN 50* 47*  CREATININE 1.88* 1.67*  CALCIUM  14.9* 12.4*  PHOS  --  2.8   GFR: Estimated Creatinine Clearance: 22.9 mL/min (A) (by C-G formula based on SCr of 1.67 mg/dL (H)). Liver Function Tests: Recent Labs   Lab 02/04/19 0446  ALBUMIN 2.3*   No results for input(s): LIPASE, AMYLASE in the last 168 hours. No results for input(s): AMMONIA in the last 168 hours. Coagulation Profile: No results for input(s): INR, PROTIME in the last 168 hours. Cardiac Enzymes: No results for input(s): CKTOTAL, CKMB, CKMBINDEX, TROPONINI in the last 168 hours. BNP (last 3 results) No results for input(s): PROBNP in the last 8760 hours. HbA1C: No results for input(s): HGBA1C in the last 72 hours. CBG: Recent Labs  Lab 02/03/19 1340 02/04/19 0902  GLUCAP 122* 93   Lipid Profile: No results for input(s): CHOL, HDL, LDLCALC, TRIG, CHOLHDL, LDLDIRECT in the last 72 hours. Thyroid Function Tests: Recent Labs    02/03/19 1845  TSH 1.502   Anemia Panel: No results for input(s): VITAMINB12, FOLATE, FERRITIN, TIBC, IRON, RETICCTPCT in the last 72 hours. Urine analysis:    Component Value Date/Time   COLORURINE YELLOW 02/04/2019 0303   APPEARANCEUR CLEAR 02/04/2019 0303   LABSPEC 1.017 02/04/2019 0303   PHURINE 5.0 02/04/2019 0303   GLUCOSEU NEGATIVE 02/04/2019 0303   GLUCOSEU NEGATIVE 07/07/2014 1225   HGBUR NEGATIVE 02/04/2019 0303   BILIRUBINUR NEGATIVE 02/04/2019 0303   KETONESUR 5 (A) 02/04/2019 0303   PROTEINUR NEGATIVE 02/04/2019 0303   UROBILINOGEN 0.2 07/07/2014 1225   NITRITE NEGATIVE 02/04/2019 0303   LEUKOCYTESUR SMALL (A) 02/04/2019 0303   Sepsis Labs: @LABRCNTIP (procalcitonin:4,lacticidven:4)  ) Recent Results (from the past 240 hour(s))  SARS CORONAVIRUS 2 (TAT 6-24 HRS) Nasopharyngeal Nasopharyngeal Swab     Status: None   Collection Time: 02/03/19  2:08 PM   Specimen: Nasopharyngeal Swab  Result Value Ref Range Status   SARS Coronavirus 2 NEGATIVE NEGATIVE Final    Comment: (NOTE) SARS-CoV-2 target nucleic acids are NOT DETECTED. The SARS-CoV-2 RNA is generally detectable in upper and lower respiratory specimens during the acute phase of infection. Negative results do not  preclude SARS-CoV-2 infection, do not rule out co-infections with other pathogens, and should not be used as the sole basis for treatment or other patient management decisions. Negative results must be combined with clinical observations, patient history, and epidemiological information. The expected result is Negative. Fact Sheet for Patients: SugarRoll.be Fact Sheet for Healthcare Providers: https://www.woods-mathews.com/ This test is not yet approved or cleared by the [place] FDA and  has been authorized for detection and/or diagnosis of SARS-CoV-2 by FDA under an Emergency Use Authorization (EUA). This EUA will remain  in effect (meaning this test can be used) for the duration of the COVID-19 declaration under Section 56 4(b)(1) of the Act, 21 U.S.C. section 360bbb-3(b)(1), unless the authorization is terminated or revoked sooner. Performed at [facility], [place] [address], [place], [place] [address]      Radiology Studies: No results found.  Scheduled Meds: . aspirin  81 mg Oral Daily  . calcitonin  4 Units/kg Subcutaneous BID  . carbidopa-levodopa  1 tablet Oral 5 times per day  . Carbidopa-Levodopa ER  1 tablet Oral 3 times per day  . Carbidopa-Levodopa ER  2 tablet Oral 2 times per day  . enoxaparin (LOVENOX) injection  30 mg Subcutaneous Q24H  . rOPINIRole  1 mg Oral TID   Continuous Infusions: . sodium chloride 125 mL/hr at 02/04/19 0831     LOS: 0 days  Time spent: 35 minutes    Dana Allan, MD  [facility] Pager #: [phone] 7PM-7AM contact night coverage as above

## 2019-02-04 NOTE — Plan of Care (Signed)
  Problem: Education: Goal: Knowledge of General Education information will improve Description Including pain rating scale, medication(s)/side effects and non-pharmacologic comfort measures Outcome: Progressing   Problem: Activity: Goal: Risk for activity intolerance will decrease Outcome: Progressing   Problem: Safety: Goal: Ability to remain free from injury will improve Outcome: Progressing   

## 2019-02-04 NOTE — ED Notes (Signed)
Family updated.

## 2019-02-05 ENCOUNTER — Inpatient Hospital Stay (HOSPITAL_COMMUNITY): Payer: Medicare Other

## 2019-02-05 LAB — RENAL FUNCTION PANEL
Albumin: 2.1 g/dL — ABNORMAL LOW (ref 3.5–5.0)
Anion gap: 12 (ref 5–15)
BUN: 26 mg/dL — ABNORMAL HIGH (ref 8–23)
CO2: 19 mmol/L — ABNORMAL LOW (ref 22–32)
Calcium: 10.1 mg/dL (ref 8.9–10.3)
Chloride: 109 mmol/L (ref 98–111)
Creatinine, Ser: 1 mg/dL (ref 0.44–1.00)
GFR calc Af Amer: >60 mL/min (ref >60)
GFR calc non Af Amer: 57 mL/min — ABNORMAL LOW (ref >60)
Glucose, Bld: 97 mg/dL (ref 70–99)
Phosphorus: 2 mg/dL — ABNORMAL LOW (ref 2.5–4.6)
Potassium: 3.4 mmol/L — ABNORMAL LOW (ref 3.5–5.1)
Sodium: 140 mmol/L (ref 135–145)

## 2019-02-05 LAB — CBC
HCT: 34.1 % — ABNORMAL LOW (ref 36.0–46.0)
Hemoglobin: 11.2 g/dL — ABNORMAL LOW (ref 12.0–15.0)
MCH: 29.9 pg (ref 26.0–34.0)
MCHC: 32.8 g/dL (ref 30.0–36.0)
MCV: 91.2 fL (ref 80.0–100.0)
Platelets: 285 10*3/uL (ref 150–400)
RBC: 3.74 MIL/uL — ABNORMAL LOW (ref 3.87–5.11)
RDW: 13 % (ref 11.5–15.5)
WBC: 10 10*3/uL (ref 4.0–10.5)
nRBC: 0 % (ref 0.0–0.2)

## 2019-02-05 LAB — MAGNESIUM: Magnesium: 1.4 mg/dL — ABNORMAL LOW (ref 1.7–2.4)

## 2019-02-05 MED ORDER — POTASSIUM CHLORIDE CRYS ER 20 MEQ PO TBCR
40.0000 meq | EXTENDED_RELEASE_TABLET | Freq: Once | ORAL | Status: AC
  Administered 2019-02-05: 40 meq via ORAL
  Filled 2019-02-05: qty 2

## 2019-02-05 MED ORDER — ONDANSETRON HCL 4 MG/2ML IJ SOLN
4.0000 mg | Freq: Four times a day (QID) | INTRAMUSCULAR | Status: DC | PRN
  Administered 2019-02-05 (×2): 4 mg via INTRAVENOUS
  Filled 2019-02-05 (×2): qty 2

## 2019-02-05 NOTE — Progress Notes (Addendum)
15:07 - Paged Dr. Louanne Belton at patient's husband request.  The husband is concerned with the patient's increasing level of confusion.  She stated to the husband, "I feel like I am falling," and the husband told her she was not flying; she was lying in the bed.  I asked the patient if she felt dizzy or like the room was spinning, and she stated, "it feels like my feet are rolling."  15:20 - Re-paged doctor  [phone] - Daughter called and requested that patient have a MRI to rule out TIA/CVA  15:40 - Re-paged physician  16:00 - Physicians pager was not working, but contacted him through secure chat and told him of issues listed above that the family was concerned with.  18:55 - Patient transported to CT

## 2019-02-05 NOTE — Plan of Care (Signed)
  Problem: Education: Goal: Knowledge of General Education information will improve Description Including pain rating scale, medication(s)/side effects and non-pharmacologic comfort measures Outcome: Progressing   Problem: Health Behavior/Discharge Planning: Goal: Ability to manage health-related needs will improve Outcome: Progressing   

## 2019-02-05 NOTE — Evaluation (Signed)
Physical Therapy Evaluation Patient Details Name: Destiny Roth MRN: [id number] DOB: 1950/04/21 Today's Date: 02/05/2019   History of Present Illness  69 year old female admitted with progressive weakness with hypercalcemia. PMHx: Parkinson's disease, restrictive lung disease, impaired glucose tolerance, IBS, HTN, HLD, diverticulosis and GERD.  Clinical Impression  Pt pleasantly confused unaware of day, situation, or place. Pt distracted and manipulating lines and linens throughout session needing redirection throughout. Pt with generalized weakness, decreased safety awareness, impaired transfers and gait who currently requires min-mod assist for all transfers and will benefit from acute therapy to maximize mobility, safety and function. Pt reports spouse able to assist at home with 1 step down into living room and 3 steps to enter. Pt will need to be able to ambulate and perform stairs for return home.     Follow Up Recommendations Home health PT;Supervision/Assistance - 24 hour    Equipment Recommendations  Rolling walker with 5" wheels;3in1 (PT)    Recommendations for Other Services OT consult     Precautions / Restrictions Precautions Precautions: Fall      Mobility  Bed Mobility Overal bed mobility: Needs Assistance Bed Mobility: Supine to Sit     Supine to sit: Mod assist;HOB elevated     General bed mobility comments: HOB 30 degrees with assist to fully elevate trunk and slide toward EOB  Transfers Overall transfer level: Needs assistance   Transfers: Sit to/from Stand;Stand Pivot Transfers Sit to Stand: Min assist Stand pivot transfers: Mod assist       General transfer comment: min assist to stand from surface and pivot to [facility] then transition from [employer] to recliner with use of RW. Pt continually grabbing for lines or linens rather than leaving hands on RW for transfer as cued. Assist to direct and maneuver RW and control transfer  Ambulation/Gait              General Gait Details: pt reports dizziness and fatigue and unable to ambulate at this time  Stairs            Wheelchair Mobility    Modified Rankin (Stroke Patients Only)       Balance Overall balance assessment: Needs assistance Sitting-balance support: Bilateral upper extremity supported Sitting balance-Leahy Scale: Fair Sitting balance - Comments: guarding for EOB and at [facility]   Standing balance support: Bilateral upper extremity supported Standing balance-Leahy Scale: Poor Standing balance comment: single and bil UE support in standing                             Pertinent Vitals/Pain Pain Assessment: No/denies pain    Home Living Family/patient expects to be discharged to:: Private residence Living Arrangements: Spouse/significant other Available Help at Discharge: Family Type of Home: House Home Access: Stairs to enter Entrance Stairs-Rails: Right Entrance Stairs-Number of Steps: 3 Home Layout: Two level;Able to live on main level with bedroom/bathroom Home Equipment: None      Prior Function Level of Independence: Independent         Comments: trouble for the last 2 weeks PTA moving slowly     Hand Dominance        Extremity/Trunk Assessment   Upper Extremity Assessment Upper Extremity Assessment: Generalized weakness    Lower Extremity Assessment Lower Extremity Assessment: Generalized weakness    Cervical / Trunk Assessment Cervical / Trunk Assessment: Kyphotic  Communication   Communication: No difficulties  Cognition Arousal/Alertness: Awake/alert Behavior During Therapy: Flat affect Overall Cognitive  Status: Impaired/Different from baseline Area of Impairment: Orientation;Attention;Memory;Following commands;Safety/judgement                 Orientation Level: Disoriented to;Time;Situation;Place Current Attention Level: Sustained Memory: Decreased short-term memory Following Commands: Follows one step  commands with increased time Safety/Judgement: Decreased awareness of safety;Decreased awareness of deficits     General Comments: pt stating "thur" "[place]". Pt with slow processing, decreasd awareness of safety and function. Slow moving.      General Comments      Exercises     Assessment/Plan    PT Assessment Patient needs continued PT services  PT Problem List Decreased strength;Decreased mobility;Decreased safety awareness;Decreased activity tolerance;Decreased balance;Decreased knowledge of use of DME;Decreased cognition;Decreased coordination       PT Treatment Interventions Gait training;Balance training;Stair training;Functional mobility training;Therapeutic activities;Patient/family education;Cognitive remediation;DME instruction;Therapeutic exercise    PT Goals (Current goals can be found in the Care Plan section)  Acute Rehab PT Goals Patient Stated Goal: return home PT Goal Formulation: With patient Time For Goal Achievement: 02/19/19 Potential to Achieve Goals: Fair    Frequency Min 3X/week   Barriers to discharge        Co-evaluation               AM-PAC PT "6 Clicks" Mobility  Outcome Measure Help needed turning from your back to your side while in a flat bed without using bedrails?: A Little Help needed moving from lying on your back to sitting on the side of a flat bed without using bedrails?: A Lot Help needed moving to and from a bed to a chair (including a wheelchair)?: A Little Help needed standing up from a chair using your arms (e.g., wheelchair or bedside chair)?: A Little Help needed to walk in hospital room?: A Lot Help needed climbing 3-5 steps with a railing? : A Lot 6 Click Score: 15    End of Session Equipment Utilized During Treatment: Gait belt Activity Tolerance: Patient tolerated treatment well Patient left: in chair;with chair alarm set;with call bell/phone within reach Nurse Communication: Mobility  status;Precautions PT Visit Diagnosis: Other abnormalities of gait and mobility (R26.89);Muscle weakness (generalized) (M62.81)    Time: [id number] PT Time Calculation (min) (ACUTE ONLY): 30 min   Charges:   PT Evaluation $PT Eval Moderate Complexity: 1 Mod PT Treatments $Therapeutic Activity: 8-22 mins        Destiny Roth, PT Acute Rehabilitation Services Pager: [phone] Office: [phone]   Destiny Roth 02/05/2019, 12:36 PM

## 2019-02-05 NOTE — Progress Notes (Addendum)
PROGRESS NOTE  Destiny Roth [id number] DOB: [birth year] DOA: 02/03/2019 PCP: No primary care provider on file.   LOS: 1 day   Brief narrative: As per HPI, Patient is a 69 year old female with past medical history significant for Parkinson's disease, restrictive lung disease, impaired glucose tolerance, IBS, hypertension, hyperlipidemia, diverticulosis and GERD  was admitted to the hospital with progressive weakness. On presentation to the hospital, patient was found to have calcium of 14.9, serum creatinine of 1.8, with associated volume depletion. Patient was on calcium supplement, vitamin D and multivitamin prior to presentation. Patient also reported associated diarrhea. Intact PTH was 5.   Work-up for the possible cause of hypercalcemia is in progress (suspect multifactorial, cannot entirely rule out underlying malignancy). Patient remains very weak, and mildly lethargic.  Assessment/Plan:  Active Problems:   Essential hypertension   Hypercalcemia   Generalized weakness   AKI (acute kidney injury) ([place])  Hypercalcemia with mild metabolic encephalopathy.: Likely multifactorial secondary to hypercalcemia, volume depletion and AKI.  As per the patient, she was taking excessive calcium and vitamin D as outpatient for osteoporosis.  Myeloma work-up has been sent but hemoglobin is around 11.2.   Patient was counseled against excessive intake of calcium and vitamin D.  Calcium level has improved with hydration and micalcin.  Continue IV fluid hydration.  Intact PTH 5 and was low.  24 hydroxy vitamin D was 65.  COVID-19 test was negative.  PTH related peptide in progress.  TSH was 1.5.  Elevated kappa and lambda light chains which could be seen in any kind of infection but protein electrophoresis did not show any spike.  Spoke with Dr. Lindi Adie oncology who does not feel that it is multiple myeloma but recommends screening for malignancy which could be done as outpatient.. will obtain CT head  scan since patient's family is very concerned and stated she had slurred speech.   Acute kidney injury: Secondary to volume depletion.  Improving.  Continue with IV fluid hydration.  Creatinine of 1.0 from 1.8 today.  Parkinson's disease: -Continue  sinemet, amantadine. Will need PT  Volume depletion: Continue IV fluid hydration. Reassess in am.  Weakness and lethargy: Likely secondary to volume depletion and hypercalcemia.  Physical therapy consulted.  We will continue as tolerated.  Essential hypertension: Consider to discontinue HCTZ on discharge due to hypercalcemia.  Will closely monitor blood pressure.  Mild hypokalemia.  Will replenish, check bmp in am.  VTE Prophylaxis: Lovenox   Code Status: Full code  Family Communication: I spoke with the patient's spouse  Mr [name] on the phone and updated him about the clinical condition of the patient. He requested that I also speak with the patient's daughter Ms Roxanna Mew so I also updated her.   Disposition Plan: Likely home with home PT.  Undetermined at this time.  Await PT evaluation.  Continue IV fluid hydration. Check labs closely. Check Ct head scan.   Consultants:  Spoke with Dr. Lindi Adie hematooncology..  Procedures:  None  Antibiotics:  Anti-infectives (From admission, onward)   None     Subjective: Today, patient complains of generalized weakness, fatigue.  Feels thirsty and dry.  Mildly confused at times.  Objective: Vitals:   02/05/19 0554 02/05/19 0838  BP: (!) 150/80 (!) 147/81  Pulse: (!) 105 97  Resp: 16   Temp: (!) 97.5 F (36.4 C) 97.8 F (36.6 C)  SpO2: 94% 100%    Intake/Output Summary (Last 24 hours) at 02/05/2019 1128 Last data filed at 02/05/2019 1100 Gross  per 24 hour  Intake --  Output 1650 ml  Net -1650 ml   Filed Weights   02/03/19 1815 02/04/19 0200  Weight: 59 kg 59 kg   Body mass index is 28.45 kg/m.   Physical Exam: GENERAL: Patient is alert awake and communicative.   Slow to respond.  Not in obvious distress. HENT: No scleral pallor or icterus. Pupils equally reactive to light. Oral mucosa is dry NECK: is supple, no palpable thyroid enlargement. CHEST: Clear to auscultation. No crackles or wheezes. Non tender on palpation. Diminished breath sounds bilaterally. CVS: S1 and S2 heard, no murmur. Regular rate and rhythm. No pericardial rub. ABDOMEN: Soft, non-tender, bowel sounds are present. EXTREMITIES: No edema. CNS: Cranial nerves are intact. No focal motor or sensory deficits.  Alert awake and communicative but intermittently confused. SKIN: warm and dry without rashes.  Diminished skin turgor  Data Review: I have personally reviewed the following laboratory data and studies,  CBC: Recent Labs  Lab 02/03/19 1127 02/04/19 0446 02/05/19 0226  WBC 12.3* 8.2 10.0  HGB 14.5 12.3 11.2*  HCT 45.1 38.9 34.1*  MCV 92.6 92.8 91.2  PLT 378 301 726   Basic Metabolic Panel: Recent Labs  Lab 02/03/19 1127 02/04/19 0446 02/05/19 0226  NA 134* 138 140  K 5.2* 4.6 3.4*  CL 101 104 109  CO2 19* 25 19*  GLUCOSE 118* 80 97  BUN 50* 47* 26*  CREATININE 1.88* 1.67* 1.00  CALCIUM 14.9* 12.4* 10.1  MG  --   --  1.4*  PHOS  --  2.8 2.0*   Liver Function Tests: Recent Labs  Lab 02/04/19 0446 02/05/19 0226  ALBUMIN 2.3* 2.1*   No results for input(s): LIPASE, AMYLASE in the last 168 hours. No results for input(s): AMMONIA in the last 168 hours. Cardiac Enzymes: No results for input(s): CKTOTAL, CKMB, CKMBINDEX, TROPONINI in the last 168 hours. BNP (last 3 results) No results for input(s): BNP in the last 8760 hours.  ProBNP (last 3 results) No results for input(s): PROBNP in the last 8760 hours.  CBG: Recent Labs  Lab 02/03/19 1340 02/04/19 0902  GLUCAP 122* 93   Recent Results (from the past 240 hour(s))  SARS CORONAVIRUS 2 (TAT 6-24 HRS) Nasopharyngeal Nasopharyngeal Swab     Status: None   Collection Time: 02/03/19  2:08 PM    Specimen: Nasopharyngeal Swab  Result Value Ref Range Status   SARS Coronavirus 2 NEGATIVE NEGATIVE Final    Comment: (NOTE) SARS-CoV-2 target nucleic acids are NOT DETECTED. The SARS-CoV-2 RNA is generally detectable in upper and lower respiratory specimens during the acute phase of infection. Negative results do not preclude SARS-CoV-2 infection, do not rule out co-infections with other pathogens, and should not be used as the sole basis for treatment or other patient management decisions. Negative results must be combined with clinical observations, patient history, and epidemiological information. The expected result is Negative. Fact Sheet for Patients: SugarRoll.be Fact Sheet for Healthcare Providers: https://www.woods-mathews.com/ This test is not yet approved or cleared by the [place] FDA and  has been authorized for detection and/or diagnosis of SARS-CoV-2 by FDA under an Emergency Use Authorization (EUA). This EUA will remain  in effect (meaning this test can be used) for the duration of the COVID-19 declaration under Section 56 4(b)(1) of the Act, 21 U.S.C. section 360bbb-3(b)(1), unless the authorization is terminated or revoked sooner. Performed at [facility], [place] [address] [place], [place] [address]  Studies: No results found.  Scheduled Meds: . aspirin  81 mg Oral Daily  . carbidopa-levodopa  1 tablet Oral 5 times per day  . Carbidopa-Levodopa ER  1 tablet Oral 3 times per day  . Carbidopa-Levodopa ER  2 tablet Oral 2 times per day  . enoxaparin (LOVENOX) injection  30 mg Subcutaneous Q24H  . rOPINIRole  1 mg Oral TID    Continuous Infusions: . sodium chloride 125 mL/hr at 02/05/19 1022     Flora Lipps, MD  [facility] 02/05/2019

## 2019-02-06 DIAGNOSIS — F99 Mental disorder, not otherwise specified: Secondary | ICD-10-CM

## 2019-02-06 LAB — COMPREHENSIVE METABOLIC PANEL
ALT: 12 U/L (ref 0–44)
AST: 71 U/L — ABNORMAL HIGH (ref 15–41)
Albumin: 2 g/dL — ABNORMAL LOW (ref 3.5–5.0)
Alkaline Phosphatase: 100 U/L (ref 38–126)
Anion gap: 10 (ref 5–15)
BUN: 26 mg/dL — ABNORMAL HIGH (ref 8–23)
CO2: 19 mmol/L — ABNORMAL LOW (ref 22–32)
Calcium: 10 mg/dL (ref 8.9–10.3)
Chloride: 112 mmol/L — ABNORMAL HIGH (ref 98–111)
Creatinine, Ser: 1 mg/dL (ref 0.44–1.00)
GFR calc Af Amer: >60 mL/min (ref >60)
GFR calc non Af Amer: 57 mL/min — ABNORMAL LOW (ref >60)
Glucose, Bld: 85 mg/dL (ref 70–99)
Potassium: 3.9 mmol/L (ref 3.5–5.1)
Sodium: 141 mmol/L (ref 135–145)
Total Bilirubin: 0.4 mg/dL (ref 0.3–1.2)
Total Protein: 4.9 g/dL — ABNORMAL LOW (ref 6.5–8.1)

## 2019-02-06 LAB — LIPID PANEL
Cholesterol: 106 mg/dL (ref 0–200)
HDL: <10 mg/dL — ABNORMAL LOW (ref >40)
Triglycerides: 240 mg/dL — ABNORMAL HIGH (ref <150)
VLDL: 48 mg/dL — ABNORMAL HIGH (ref 0–40)

## 2019-02-06 LAB — PTH, INTACT AND CALCIUM
Calcium, Total (PTH): 9.7 mg/dL (ref 8.7–10.3)
PTH: 6 pg/mL — ABNORMAL LOW (ref 15–65)

## 2019-02-06 LAB — CBC
HCT: 35.1 % — ABNORMAL LOW (ref 36.0–46.0)
Hemoglobin: 11.2 g/dL — ABNORMAL LOW (ref 12.0–15.0)
MCH: 29.2 pg (ref 26.0–34.0)
MCHC: 31.9 g/dL (ref 30.0–36.0)
MCV: 91.6 fL (ref 80.0–100.0)
Platelets: 298 10*3/uL (ref 150–400)
RBC: 3.83 MIL/uL — ABNORMAL LOW (ref 3.87–5.11)
RDW: 13.6 % (ref 11.5–15.5)
WBC: 8.9 10*3/uL (ref 4.0–10.5)
nRBC: 0.2 % (ref 0.0–0.2)

## 2019-02-06 LAB — MAGNESIUM: Magnesium: 1.6 mg/dL — ABNORMAL LOW (ref 1.7–2.4)

## 2019-02-06 LAB — PHOSPHORUS: Phosphorus: 2.3 mg/dL — ABNORMAL LOW (ref 2.5–4.6)

## 2019-02-06 MED ORDER — AMLODIPINE BESYLATE 2.5 MG PO TABS
2.5000 mg | ORAL_TABLET | Freq: Every day | ORAL | Status: DC
  Administered 2019-02-06 – 2019-02-09 (×4): 2.5 mg via ORAL
  Filled 2019-02-06 (×4): qty 1

## 2019-02-06 NOTE — Progress Notes (Signed)
PROGRESS NOTE  BOBBI YOUNT [id number] DOB: October 24, 1950 DOA: 02/03/2019 PCP: No primary care provider on file.   LOS: 2 days   Brief narrative: As per HPI, Patient is a 69 year old female with past medical history significant for Parkinson's disease, restrictive lung disease, impaired glucose tolerance, IBS, hypertension, hyperlipidemia, diverticulosis and GERD  was admitted to the hospital with progressive weakness. On presentation to the hospital, patient was found to have calcium of 14.9, serum creatinine of 1.8, with associated volume depletion. Patient was on calcium supplement, vitamin D and multivitamin prior to presentation. Patient also reported associated diarrhea. Intact PTH was 5.   Work-up for the possible cause of hypercalcemia is in progress (suspect multifactorial, cannot entirely rule out underlying malignancy). Patient remains very weak, and mildly lethargic.  02/06/2019: Patient seen alongside patient's husband.  Patient continues to experience visual hallucinations, mainly in the evening time.  Discussed with the patient's husband extensively, will hold off on prescribing any antipsychotic medications for now.  Possibilities include acute confusional state/delirium versus possible dementia in Parkinson's disease versus Lewy body dementia.  Significantly, patient has had S very well.  We will continue current management.  Continue to monitor calcium closely.  Follow-up with hematology/oncology on discharge.  Repeat renal panel.  Check PTH related protein.    Assessment/Plan:  Active Problems:   Essential hypertension   Hypercalcemia   Generalized weakness   AKI (acute kidney injury) ([place])  Hypercalcemia with mild metabolic encephalopathy.: Likely multifactorial secondary to hypercalcemia, volume depletion and AKI.  As per the patient, she was taking excessive calcium and vitamin D as outpatient for osteoporosis.  Myeloma work-up has been sent but hemoglobin is around  11.2.   Patient was counseled against excessive intake of calcium and vitamin D.  Calcium level has improved with hydration and micalcin.  Continue IV fluid hydration.  Intact PTH 5 and was low.  24 hydroxy vitamin D was 65.  COVID-19 test was negative.  PTH related peptide in progress.  TSH was 1.5.  Elevated kappa and lambda light chains which could be seen in any kind of infection but protein electrophoresis did not show any spike.  Spoke with Dr. Lindi Adie oncology who does not feel that it is multiple myeloma but recommends screening for malignancy which could be done as outpatient.. will obtain CT head scan since patient's family is very concerned and stated she had slurred speech. 02/06/2019: Continue current management.  Corrected calcium level today is 11.6.  Check PTH related protein.  Follow with hematology/oncology on discharge.  Continue to hold calcium supplements.  Acute kidney injury: Secondary to volume depletion.  Improving.  Continue with IV fluid hydration.  Creatinine of 1.0 from 1.8 today.  02/05/2018: AKI has resolved.  Continue to optimize volume.  Parkinson's disease: -Continue  sinemet, amantadine. Will need PT  Volume depletion: Continue IV fluid hydration. Reassess in am. 02/05/2018: Kindly see above.  Weakness and lethargy: Likely secondary to volume depletion and hypercalcemia.  Physical therapy consulted.  We will continue as tolerated. 02/05/2018: PT OT and PT is appreciated.  Lethargy has improved significantly.  Essential hypertension: Consider to discontinue HCTZ on discharge due to hypercalcemia.  Will closely monitor blood pressure. 02/06/2019: Continue to monitor closely.  Start Norvasc 2.5 Mg p.o. once daily.  Mild hypokalemia.  Will replenish, check bmp in am. 02/06/2019: Potassium today is 3.9.  Continue to monitor closely and replace.  Visual hallucination: -This could be organic psychosis. -Patient has hypercalcemia, volume depletion and AKI.as  resolved. -  Continue to optimize above. -Cannot rule out underlying dementia and Parkinson's disease versus Lewy body dementia. -We will be careful with antipsychotics as patient may be very sensitive. -Discussed with patient's husband extensively, antipsychotics will be held for now as visual hallucination is improving.  VTE Prophylaxis: Lovenox   Code Status: Full code  Family Communication: Husband.  Disposition Plan: Likely home with home PT.  Undetermined at this time.  Await PT evaluation.  Continue IV fluid hydration. Check labs closely. Check Ct head scan.   Consultants:  As per prior documentation "spoke with Dr. Lindi Adie hematooncology ".  Procedures:  None  Antibiotics:  Anti-infectives (From admission, onward)   None     Subjective: Reports seeing someone else in the room.  Objective: Vitals:   02/06/19 0759 02/06/19 1517  BP: (!) 166/78 138/81  Pulse: 98 84  Resp:    Temp: 97.8 F (36.6 C) 98.4 F (36.9 C)  SpO2: 93% 96%    Intake/Output Summary (Last 24 hours) at 02/06/2019 1800 Last data filed at 02/06/2019 0900 Gross per 24 hour  Intake 554 ml  Output 300 ml  Net 254 ml   Filed Weights   02/03/19 1815 02/04/19 0200  Weight: 59 kg 59 kg   Body mass index is 28.45 kg/m.   Physical Exam: GENERAL: Patient is alert and awake.  Parkinsonian facies and signs.   HENT: No pallor or jaundice.   NECK: Supple.   CHEST: Clear to auscultation.  CVS: S1 and S2 heard. ABDOMEN: Soft, non-tender, bowel sounds are present. EXTREMITIES: No edema. CNS: Awake and alert.  Parkinson officiate.  Visual hallucination.  Moves all extremities.  Data Review: I have personally reviewed the following laboratory data and studies,  CBC: Recent Labs  Lab 02/03/19 1127 02/04/19 0446 02/05/19 0226 02/06/19 0920  WBC 12.3* 8.2 10.0 8.9  HGB 14.5 12.3 11.2* 11.2*  HCT 45.1 38.9 34.1* 35.1*  MCV 92.6 92.8 91.2 91.6  PLT 378 301 285 481   Basic Metabolic  Panel: Recent Labs  Lab 02/03/19 1127 02/04/19 0446 02/05/19 0226 02/05/19 0739 02/06/19 0920  NA 134* 138 140  --  141  K 5.2* 4.6 3.4*  --  3.9  CL 101 104 109  --  112*  CO2 19* 25 19*  --  19*  GLUCOSE 118* 80 97  --  85  BUN 50* 47* 26*  --  26*  CREATININE 1.88* 1.67* 1.00  --  1.00  CALCIUM 14.9* 12.4* 10.1 9.7 10.0  MG  --   --  1.4*  --  1.6*  PHOS  --  2.8 2.0*  --  2.3*   Liver Function Tests: Recent Labs  Lab 02/04/19 0446 02/05/19 0226 02/06/19 0920  AST  --   --  71*  ALT  --   --  12  ALKPHOS  --   --  100  BILITOT  --   --  0.4  PROT  --   --  4.9*  ALBUMIN 2.3* 2.1* 2.0*   No results for input(s): LIPASE, AMYLASE in the last 168 hours. No results for input(s): AMMONIA in the last 168 hours. Cardiac Enzymes: No results for input(s): CKTOTAL, CKMB, CKMBINDEX, TROPONINI in the last 168 hours. BNP (last 3 results) No results for input(s): BNP in the last 8760 hours.  ProBNP (last 3 results) No results for input(s): PROBNP in the last 8760 hours.  CBG: Recent Labs  Lab 02/03/19 1340 02/04/19 0902  GLUCAP 122* 93  Recent Results (from the past 240 hour(s))  SARS CORONAVIRUS 2 (TAT 6-24 HRS) Nasopharyngeal Nasopharyngeal Swab     Status: None   Collection Time: 02/03/19  2:08 PM   Specimen: Nasopharyngeal Swab  Result Value Ref Range Status   SARS Coronavirus 2 NEGATIVE NEGATIVE Final    Comment: (NOTE) SARS-CoV-2 target nucleic acids are NOT DETECTED. The SARS-CoV-2 RNA is generally detectable in upper and lower respiratory specimens during the acute phase of infection. Negative results do not preclude SARS-CoV-2 infection, do not rule out co-infections with other pathogens, and should not be used as the sole basis for treatment or other patient management decisions. Negative results must be combined with clinical observations, patient history, and epidemiological information. The expected result is Negative. Fact Sheet for  Patients: SugarRoll.be Fact Sheet for Healthcare Providers: https://www.woods-mathews.com/ This test is not yet approved or cleared by the [place] FDA and  has been authorized for detection and/or diagnosis of SARS-CoV-2 by FDA under an Emergency Use Authorization (EUA). This EUA will remain  in effect (meaning this test can be used) for the duration of the COVID-19 declaration under Section 56 4(b)(1) of the Act, 21 U.S.C. section 360bbb-3(b)(1), unless the authorization is terminated or revoked sooner. Performed at [facility], [place] [address], [place], [place] [address]      Studies: CT HEAD WO CONTRAST  Result Date: 02/05/2019 CLINICAL DATA:  Altered mental status EXAM: CT HEAD WITHOUT CONTRAST TECHNIQUE: Contiguous axial images were obtained from the base of the skull through the vertex without intravenous contrast. COMPARISON:  None. FINDINGS: Brain: Mild atrophic changes are noted. No findings to suggest acute hemorrhage, acute infarction or space-occupying mass lesion are noted. Vascular: No hyperdense vessel or unexpected calcification. Skull: Normal. Negative for fracture or focal lesion. Sinuses/Orbits: No acute finding. Other: None. IMPRESSION: Mild atrophic changes without acute abnormality. Electronically Signed   By: Inez Catalina M.D.   On: 02/05/2019 19:16    Scheduled Meds: . aspirin  81 mg Oral Daily  . carbidopa-levodopa  1 tablet Oral 5 times per day  . Carbidopa-Levodopa ER  1 tablet Oral 3 times per day  . Carbidopa-Levodopa ER  2 tablet Oral 2 times per day  . rOPINIRole  1 mg Oral TID    Continuous Infusions: . sodium chloride 125 mL/hr at 02/05/19 [place] Chaya Dehaan, MD  [facility] 02/06/2019

## 2019-02-06 NOTE — Progress Notes (Signed)
Physical Therapy Treatment Patient Details Name: Destiny Roth MRN: [id number] DOB: [birth year] Today's Date: 02/06/2019    History of Present Illness 69 year old female admitted with progressive weakness with hypercalcemia. PMHx: Parkinson's disease, restrictive lung disease, impaired glucose tolerance, IBS, HTN, HLD, diverticulosis and GERD.    PT Comments    Patient seen for mobility progression. Pt oriented to place and time however continues to present with confusion and asking if there were bugs on the bathroom floor. Pt is slow to process (not sure about her baseline cognitive function) and demonstrates poor motor planning. Pt requires min A for functional transfer and gait training and reports her husband has been assisting her with mobility over the last 2 weeks.  Current plan remains appropriate.     Follow Up Recommendations  Home health PT;Supervision/Assistance - 24 hour     Equipment Recommendations  Rolling walker with 5" wheels;3in1 (PT)    Recommendations for Other Services OT consult     Precautions / Restrictions Precautions Precautions: Fall Restrictions Weight Bearing Restrictions: No    Mobility  Bed Mobility Overal bed mobility: Needs Assistance Bed Mobility: Supine to Sit     Supine to sit: Min assist;HOB elevated     General bed mobility comments: assist to elevate trunk into sitting and to scoot hips to EOB  Transfers Overall transfer level: Needs assistance Equipment used: Rolling walker (2 wheeled) Transfers: Sit to/from Stand Sit to Stand: Min assist         General transfer comment: increase time to motor plan; cues for safe hand placement and sequencing    Ambulation/Gait Ambulation/Gait assistance: Min assist Gait Distance (Feet): (15 ft X 2 trials ) Assistive device: Rolling walker (2 wheeled) Gait Pattern/deviations: Step-through pattern;Decreased step length - right;Decreased step length - left;Shuffle Gait velocity:  decreased   General Gait Details: cues for safe use of RW and maintaining safe proximity; assist to steady and guide RW    Stairs             Wheelchair Mobility    Modified Rankin (Stroke Patients Only)       Balance Overall balance assessment: Needs assistance Sitting-balance support: Bilateral upper extremity supported Sitting balance-Leahy Scale: Fair Sitting balance - Comments: feeling sick and needed increased time in sitting prior to stand   Standing balance support: Bilateral upper extremity supported Standing balance-Leahy Scale: Poor                              Cognition Arousal/Alertness: Lethargic Behavior During Therapy: Flat affect Overall Cognitive Status: Impaired/Different from baseline Area of Impairment: Orientation;Attention;Memory;Following commands;Safety/judgement                 Orientation Level: Disoriented to;Time;Situation;Place Current Attention Level: Sustained Memory: Decreased short-term memory Following Commands: Follows one step commands with increased time Safety/Judgement: Decreased awareness of safety;Decreased awareness of deficits     General Comments: pt asked if there were bugs on bathroom floor and if she had lakes outside her window      Exercises      General Comments General comments (skin integrity, edema, etc.): c/o dizziness in sitting; VSS       Pertinent Vitals/Pain Pain Assessment: Faces Faces Pain Scale: Hurts a little bit Pain Location: arms Pain Descriptors / Indicators: Aching Pain Intervention(s): Limited activity within patient's tolerance;Monitored during session;Repositioned    Home Living Family/patient expects to be discharged to:: Private residence Living Arrangements: Spouse/significant other Available  Help at Discharge: Family Type of Home: House Home Access: Stairs to enter Entrance Stairs-Rails: Right Home Layout: Two level;Able to live on main level with  bedroom/bathroom Home Equipment: Walker - 2 wheels Additional Comments: per pt however decrease memory/confusion    Prior Function Level of Independence: Independent      Comments: trouble for the last 2 weeks PTA moving slowly   PT Goals (current goals can now be found in the care plan section) Acute Rehab PT Goals Patient Stated Goal: return home Progress towards PT goals: Progressing toward goals    Frequency    Min 3X/week      PT Plan Current plan remains appropriate    Co-evaluation PT/OT/SLP Co-Evaluation/Treatment: Yes Reason for Co-Treatment: Complexity of the patient's impairments (multi-system involvement);Necessary to address cognition/behavior during functional activity PT goals addressed during session: Mobility/safety with mobility OT goals addressed during session: ADL's and self-care      AM-PAC PT "6 Clicks" Mobility   Outcome Measure  Help needed turning from your back to your side while in a flat bed without using bedrails?: A Little Help needed moving from lying on your back to sitting on the side of a flat bed without using bedrails?: A Lot Help needed moving to and from a bed to a chair (including a wheelchair)?: A Little Help needed standing up from a chair using your arms (e.g., wheelchair or bedside chair)?: A Little Help needed to walk in hospital room?: A Little Help needed climbing 3-5 steps with a railing? : A Lot 6 Click Score: 16    End of Session Equipment Utilized During Treatment: Gait belt Activity Tolerance: Patient tolerated treatment well Patient left: in chair;with call bell/phone within reach;with chair alarm set Nurse Communication: Mobility status PT Visit Diagnosis: Other abnormalities of gait and mobility (R26.89);Muscle weakness (generalized) (M62.81)     Time: [id number] PT Time Calculation (min) (ACUTE ONLY): 39 min  Charges:  $Gait Training: 8-22 mins                     Earney Navy, PTA Acute Rehabilitation  Services Pager: [phone] Office: [phone]     Darliss Cheney 02/06/2019, 1:32 PM

## 2019-02-06 NOTE — Evaluation (Signed)
Occupational Therapy Evaluation Patient Details Name: Destiny Roth MRN: [id number] DOB: [birth year] Today's Date: 02/06/2019    History of Present Illness 69 year old female admitted with progressive weakness with hypercalcemia. PMHx: Parkinson's disease, restrictive lung disease, impaired glucose tolerance, IBS, HTN, HLD, diverticulosis and GERD.   Clinical Impression   Pt admitted with hypercalcemia. Pt currently with functional limitations due to the deficits listed below (see OT Problem List). Pt was seen with Physical Therapy due to confusion and decrease motor planning. Pt requires constant cueing for safety and use of walker with min guard. Pt in the session when looking out her window reported seeing a lake (was top of buildings) and when in the bathroom she asked if there are bugs on the floor( was just a pattern floor). During the session she reported feeling dizzy (BP 147/87 o2  94% HR 90 sitting at EOB and following ambulation 14270 HR 106 o2 95%. Pt was able to complete transfers with supervision to min guard and ambulation with walker with min assist to min guard.  Pt will benefit from skilled OT to increase their safety and independence with ADL and functional mobility for ADL to facilitate discharge to venue listed below.       Follow Up Recommendations  Home health OT;Supervision/Assistance - 24 hour;Other (comment)(if pt does not have 24/7 care may need SNF)    Equipment Recommendations       Recommendations for Other Services       Precautions / Restrictions Precautions Precautions: Fall Restrictions Weight Bearing Restrictions: No      Mobility Bed Mobility Overal bed mobility: Needs Assistance Bed Mobility: Supine to Sit     Supine to sit: Min assist;HOB elevated        Transfers Overall transfer level: Needs assistance Equipment used: Rolling walker (2 wheeled) Transfers: Sit to/from Stand Sit to Stand: Min assist         General transfer  comment: increase time to motor plan     Balance Overall balance assessment: Needs assistance Sitting-balance support: Bilateral upper extremity supported Sitting balance-Leahy Scale: Fair Sitting balance - Comments: feeling sick                                   ADL either performed or assessed with clinical judgement   ADL Overall ADL's : Needs assistance/impaired Eating/Feeding: Minimal assistance;Sitting(needs assist intation to task)   Grooming: Independent;Sitting;Min guard   Upper Body Bathing: Minimal assistance;Sitting   Lower Body Bathing: Moderate assistance;Sit to/from stand   Upper Body Dressing : Minimal assistance;Sitting   Lower Body Dressing: Moderate assistance;Sit to/from stand   Toilet Transfer: Min guard;Regular Toilet;Grab bars;Cueing for sequencing   Toileting- Clothing Manipulation and Hygiene: Minimal assistance;Cueing for safety;Cueing for sequencing;Sit to/from stand   Tub/ Banker: Walk-in shower;Ambulation;Rolling walker;Grab bars;Minimal assistance   Functional mobility during ADLs: Cueing for safety;Cueing for sequencing;Rolling walker;Minimal assistance       Vision Baseline Vision/History: No visual deficits Patient Visual Report: No change from baseline Vision Assessment?: (pt reprots seeing items that are not there)     Perception Perception Perception Tested?: No   Praxis Praxis Praxis tested?: Not tested    Pertinent Vitals/Pain Pain Assessment: Faces Faces Pain Scale: Hurts a little bit Pain Location: arms Pain Descriptors / Indicators: Aching Pain Intervention(s): Limited activity within patient's tolerance;Monitored during session;Repositioned     Hand Dominance Right   Extremity/Trunk Assessment Upper Extremity Assessment Upper Extremity  Assessment: Generalized weakness   Lower Extremity Assessment Lower Extremity Assessment: Defer to PT evaluation   Cervical / Trunk Assessment Cervical /  Trunk Assessment: Kyphotic   Communication Communication Communication: Other (comment)(increase time)   Cognition Arousal/Alertness: Lethargic Behavior During Therapy: Flat affect Overall Cognitive Status: Impaired/Different from baseline Area of Impairment: Orientation;Attention;Memory;Following commands;Safety/judgement                 Orientation Level: Disoriented to;Time;Situation;Place Current Attention Level: Sustained Memory: Decreased short-term memory Following Commands: Follows one step commands with increased time Safety/Judgement: Decreased awareness of safety;Decreased awareness of deficits     General Comments: pt asked if there were bugs on bathroom floor and if she had lakes outside her window   General Comments       Exercises     Shoulder Instructions      Home Living Family/patient expects to be discharged to:: Private residence Living Arrangements: Spouse/significant other Available Help at Discharge: Family Type of Home: House Home Access: Stairs to enter Technical brewer of Steps: 3 Entrance Stairs-Rails: Right Home Layout: Two level;Able to live on main level with bedroom/bathroom     Bathroom Shower/Tub: Occupational psychologist: Standard Bathroom Accessibility: Yes How Accessible: Accessible via walker Home Equipment: Walker - 2 wheels   Additional Comments: per pt however decrease memory/confusion      Prior Functioning/Environment Level of Independence: Independent        Comments: trouble for the last 2 weeks PTA moving slowly        OT Problem List: Decreased strength;Decreased range of motion;Decreased activity tolerance;Decreased safety awareness;Pain      OT Treatment/Interventions: Self-care/ADL training;Therapeutic exercise;Neuromuscular education;Therapeutic activities;Cognitive remediation/compensation;Patient/family education;Balance training    OT Goals(Current goals can be found in the care  plan section) Acute Rehab OT Goals Patient Stated Goal: return home OT Goal Formulation: With patient Time For Goal Achievement: 02/06/19 Potential to Achieve Goals: Good ADL Goals Pt Will Perform Lower Body Bathing: with min guard assist;sit to/from stand Pt Will Transfer to Toilet: with supervision;ambulating;grab bars Pt Will Perform Toileting - Clothing Manipulation and hygiene: with supervision;sit to/from stand  OT Frequency: Min 2X/week   Barriers to D/C:            Co-evaluation PT/OT/SLP Co-Evaluation/Treatment: Yes Reason for Co-Treatment: Complexity of the patient's impairments (multi-system involvement);Necessary to address cognition/behavior during functional activity PT goals addressed during session: Mobility/safety with mobility OT goals addressed during session: ADL's and self-care      AM-PAC OT "6 Clicks" Daily Activity     Outcome Measure Help from another person eating meals?: A Little Help from another person taking care of personal grooming?: A Little Help from another person toileting, which includes using toliet, bedpan, or urinal?: A Lot Help from another person bathing (including washing, rinsing, drying)?: A Lot Help from another person to put on and taking off regular upper body clothing?: A Little Help from another person to put on and taking off regular lower body clothing?: A Lot 6 Click Score: 15   End of Session Equipment Utilized During Treatment: Gait belt Nurse Communication: Other (comment)(twch made aware about needing assist to start to self feed)  Activity Tolerance: Other (comment)(limited by decrease ability to motor plan) Patient left:    OT Visit Diagnosis: Muscle weakness (generalized) (M62.81);Pain Pain - Right/Left: Right Pain - part of body: Arm                Time: [id number] OT Time Calculation (min): 39 min  Charges:  OT General Charges $OT Visit: 1 Visit OT Evaluation $OT Eval Low Complexity: 1 Low OT  Treatments $Self Care/Home Management : 23-37 mins  Joeseph Amor OTR/L  Acute Rehab Services  [phone] office number [phone] pager number   Joeseph Amor 02/06/2019, 12:46 PM

## 2019-02-07 ENCOUNTER — Inpatient Hospital Stay (HOSPITAL_COMMUNITY): Payer: Medicare Other

## 2019-02-07 LAB — CBC WITH DIFFERENTIAL/PLATELET
Abs Immature Granulocytes: 0.21 10*3/uL — ABNORMAL HIGH (ref 0.00–0.07)
Basophils Absolute: 0.1 10*3/uL (ref 0.0–0.1)
Basophils Relative: 1 %
Eosinophils Absolute: 0.8 10*3/uL — ABNORMAL HIGH (ref 0.0–0.5)
Eosinophils Relative: 9 %
HCT: 35.7 % — ABNORMAL LOW (ref 36.0–46.0)
Hemoglobin: 11.3 g/dL — ABNORMAL LOW (ref 12.0–15.0)
Immature Granulocytes: 2 %
Lymphocytes Relative: 7 %
Lymphs Abs: 0.6 10*3/uL — ABNORMAL LOW (ref 0.7–4.0)
MCH: 29.1 pg (ref 26.0–34.0)
MCHC: 31.7 g/dL (ref 30.0–36.0)
MCV: 92 fL (ref 80.0–100.0)
Monocytes Absolute: 0.5 10*3/uL (ref 0.1–1.0)
Monocytes Relative: 5 %
Neutro Abs: 6.6 10*3/uL (ref 1.7–7.7)
Neutrophils Relative %: 76 %
Platelets: 282 10*3/uL (ref 150–400)
RBC: 3.88 MIL/uL (ref 3.87–5.11)
RDW: 13.7 % (ref 11.5–15.5)
WBC: 8.6 10*3/uL (ref 4.0–10.5)
nRBC: 0 % (ref 0.0–0.2)

## 2019-02-07 LAB — RENAL FUNCTION PANEL
Albumin: 1.9 g/dL — ABNORMAL LOW (ref 3.5–5.0)
Anion gap: 8 (ref 5–15)
BUN: 22 mg/dL (ref 8–23)
CO2: 19 mmol/L — ABNORMAL LOW (ref 22–32)
Calcium: 10.2 mg/dL (ref 8.9–10.3)
Chloride: 112 mmol/L — ABNORMAL HIGH (ref 98–111)
Creatinine, Ser: 0.93 mg/dL (ref 0.44–1.00)
GFR calc Af Amer: >60 mL/min (ref >60)
GFR calc non Af Amer: >60 mL/min (ref >60)
Glucose, Bld: 106 mg/dL — ABNORMAL HIGH (ref 70–99)
Phosphorus: 2.8 mg/dL (ref 2.5–4.6)
Potassium: 3.5 mmol/L (ref 3.5–5.1)
Sodium: 139 mmol/L (ref 135–145)

## 2019-02-07 LAB — MAGNESIUM: Magnesium: 1.6 mg/dL — ABNORMAL LOW (ref 1.7–2.4)

## 2019-02-07 MED ORDER — FLUCONAZOLE 150 MG PO TABS
150.0000 mg | ORAL_TABLET | ORAL | Status: DC
  Administered 2019-02-07: 150 mg via ORAL
  Filled 2019-02-07: qty 1

## 2019-02-07 NOTE — Plan of Care (Signed)

## 2019-02-07 NOTE — Plan of Care (Signed)

## 2019-02-07 NOTE — Progress Notes (Signed)
Destiny Roth, Destiny is a 69 year old female with past medical history significant for Parkinson's Roth, Destiny Roth, Destiny Roth, Destiny Roth, Destiny Roth, Destiny Roth, Destiny Roth  was admitted to the hospital with progressive weakness. On presentation to the hospital, Destiny was found to have calcium of 14.9, serum creatinine of 1.8, with associated volume depletion. Destiny was on calcium supplement, vitamin D and multivitamin prior to presentation. Destiny also reported associated diarrhea. Intact PTH was 5.   Work-up for the possible cause of hypercalcemia is in Destiny (suspect multifactorial, cannot entirely rule out underlying malignancy). Destiny remains very weak, and mildly lethargic.  02/06/2019: Destiny seen alongside Destiny's husband.  Destiny continues to experience visual hallucinations, mainly in the evening time.  Discussed with the Destiny's husband extensively, will hold off on prescribing any antipsychotic medications for now.  Possibilities include acute confusional state/delirium versus possible dementia in Parkinson's Roth versus Lewy body dementia.  Significantly, Destiny has had S very well.  We will continue current management.  Continue to monitor calcium closely.  Follow-up with hematology/oncology on discharge.  Repeat renal panel.  Check PTH related protein.  02/07/2019: Destiny seen alongside Destiny's husband.  Calcium remains stable.  Will consult PT and case management.  Hopefully, Destiny be discharged back home tomorrow.  Destiny will need follow-up with primary care provider and hematology/oncology team.  Further management will be on outpatient basis.  If visual hallucination continues at the evening time, consider very low dose of quetiapine.  Based on Destiny's Parkinson's  Roth, Destiny may be sensitive to antipsychotic medications.  Cannot entirely rule out Lewy body dementia.  However, Destiny has had Parkinson's for a while now.  Assessment/Plan:  Active Problems:   Essential Destiny Roth   Hypercalcemia   Generalized weakness   AKI (acute kidney injury) ([place])  Hypercalcemia with mild metabolic encephalopathy.: Likely multifactorial secondary to hypercalcemia, volume depletion and AKI.  As per the Destiny, she was taking excessive calcium and vitamin D as outpatient for osteoporosis.  Myeloma work-up has been sent but hemoglobin is around 11.2.   Destiny was counseled against excessive intake of calcium and vitamin D.  Calcium level has improved with hydration and micalcin.  Continue IV fluid hydration.  Intact PTH 5 and was low.  24 hydroxy vitamin D was 65.  COVID-19 test was negative.  PTH related peptide in Destiny.  TSH was 1.5.  Elevated kappa and lambda light chains which could be seen in any kind of infection but protein electrophoresis did not show any spike.  Spoke with Dr. Lindi Adie oncology who does not feel that it is multiple myeloma but recommends screening for malignancy which could be done as outpatient.. will obtain CT head scan since Destiny's family is very concerned and stated she had slurred speech. 02/06/2019: Continue current management.  Corrected calcium level today is 11.6.  Check PTH related protein.  Follow with hematology/oncology on discharge.  Continue to hold calcium supplements. 02/07/2019: Continue to monitor closely.  Likely discharge back home tomorrow.  Further management on outpatient basis.  Destiny will need follow-up primary care provider and hematology/oncology team.  Acute kidney injury: Secondary to volume depletion.  Improving.  Continue with IV fluid hydration.  Creatinine of 1.0 from 1.8 today.  02/05/2018: AKI has resolved.  Continue to optimize volume.  Parkinson's Roth: -Continue  sinemet, amantadine. Will need  PT  Volume depletion: Continue IV fluid hydration. Reassess in am. 02/05/2018: Kindly see above.  Weakness and lethargy: Likely secondary to volume depletion and hypercalcemia.  Physical therapy consulted.  We will continue as tolerated. 02/05/2018: PT OT and PT is appreciated.  Lethargy has improved significantly.  Essential Destiny Roth: Consider to discontinue HCTZ on discharge due to hypercalcemia.  Will closely monitor blood pressure. 02/06/2019: Continue to monitor closely.  Start Norvasc 2.5 Mg p.o. once daily.  Mild hypokalemia.  Will replenish, check bmp in am. 02/06/2019: Potassium today is 3.9.  Continue to monitor closely and replace.  Visual hallucination: -This could be organic psychosis. -Destiny has hypercalcemia, volume depletion and AKI.as resolved. -Continue to optimize above. -Cannot rule out underlying dementia and Parkinson's Roth versus Lewy body dementia. -We will be careful with antipsychotics as Destiny may be very sensitive. -Discussed with Destiny's husband extensively, antipsychotics will be held for now as visual hallucination is improving. 02/07/2019: A visual hallucination continues tonight, consider very low-dose quetiapine.  Destiny will be sensitive to antipsychotics.  See above documentation.  VTE Prophylaxis: Lovenox   Code Status: Full code  Family Communication: Husband.  Disposition Plan: Likely home with home PT.  Undetermined at this time.  Await PT evaluation.  Continue IV fluid hydration. Check labs closely. Check Ct head scan.   Consultants:  As per prior documentation "spoke with Dr. Lindi Adie hematooncology ".  Procedures:  None  Antibiotics:  Anti-infectives (From admission, onward)   Start     Dose/Rate Route Frequency Ordered Stop   02/07/19 1100  fluconazole (DIFLUCAN) tablet 150 mg     150 mg Oral Weekly 02/07/19 1034 02/21/19 1059     Subjective: No new complaints so far today. No visual hallucinations.  However,  Roth tends to occur towards evening time.  Objective: Vitals:   02/07/19 0320 02/07/19 0756  BP: (!) 160/78 (!) 163/84  Pulse: 96 94  Resp: 18   Temp: 98.2 F (36.8 C) 98.2 F (36.8 C)  SpO2: 94% 93%    Intake/Output Summary (Last 24 hours) at 02/07/2019 1048 Last data filed at 02/07/2019 0900 Gross per 24 hour  Intake 480 ml  Output 1500 ml  Net -1020 ml   Filed Weights   02/03/19 1815 02/04/19 0200  Weight: 59 kg 59 kg   Body mass index is 28.45 kg/m.   Physical Exam: GENERAL: Destiny is alert and awake.  Parkinsonian facies and signs.   HENT: No pallor or jaundice.   NECK: Supple.   CHEST: Clear to auscultation.  CVS: S1 and S2 heard. ABDOMEN: Soft, non-tender, bowel sounds are present. EXTREMITIES: No edema. CNS: Awake and alert.  Parkinson officiate.  Visual hallucination.  Moves all extremities.  Data Review: I have personally reviewed the following laboratory data and studies,  CBC: Recent Labs  Lab 02/03/19 1127 02/04/19 0446 02/05/19 0226 02/06/19 0920 02/07/19 0310  WBC 12.3* 8.2 10.0 8.9 8.6  NEUTROABS  --   --   --   --  6.6  HGB 14.5 12.3 11.2* 11.2* 11.3*  HCT 45.1 38.9 34.1* 35.1* 35.7*  MCV 92.6 92.8 91.2 91.6 92.0  PLT 378 301 285 298 578   Basic Metabolic Panel: Recent Labs  Lab 02/03/19 1127 02/04/19 0446 02/05/19 0226 02/05/19 0739 02/06/19 0920 02/07/19 0310  NA 134* 138 140  --  141 139  K 5.2* 4.6 3.4*  --  3.9 3.5  CL 101 104 109  --  112* 112*  CO2 19* 25 19*  --  19* 19*  GLUCOSE 118* 80 97  --  85 106*  BUN 50* 47* 26*  --  26* 22  CREATININE 1.88* 1.67* 1.00  --  1.00 0.93  CALCIUM 14.9* 12.4* 10.1 9.7 10.0 10.2  MG  --   --  1.4*  --  1.6* 1.6*  PHOS  --  2.8 2.0*  --  2.3* 2.8   Liver Function Tests: Recent Labs  Lab 02/04/19 0446 02/05/19 0226 02/06/19 0920 02/07/19 0310  AST  --   --  71*  --   ALT  --   --  12  --   ALKPHOS  --   --  100  --   BILITOT  --   --  0.4  --   PROT  --   --  4.9*  --     ALBUMIN 2.3* 2.1* 2.0* 1.9*   No results for input(s): LIPASE, AMYLASE in the last 168 hours. No results for input(s): AMMONIA in the last 168 hours. Cardiac Enzymes: No results for input(s): CKTOTAL, CKMB, CKMBINDEX, TROPONINI in the last 168 hours. BNP (last 3 results) No results for input(s): BNP in the last 8760 hours.  ProBNP (last 3 results) No results for input(s): PROBNP in the last 8760 hours.  CBG: Recent Labs  Lab 02/03/19 1340 02/04/19 0902  GLUCAP 122* 93   Recent Results (from the past 240 hour(s))  SARS CORONAVIRUS 2 (TAT 6-24 HRS) Nasopharyngeal Nasopharyngeal Swab     Status: None   Collection Time: 02/03/19  2:08 PM   Specimen: Nasopharyngeal Swab  Result Value Ref Range Status   SARS Coronavirus 2 NEGATIVE NEGATIVE Final    Comment: (NOTE) SARS-CoV-2 target nucleic acids are NOT DETECTED. The SARS-CoV-2 RNA is generally detectable in upper and lower respiratory specimens during the acute phase of infection. Negative results do not preclude SARS-CoV-2 infection, do not rule out co-infections with other pathogens, and should not be used as the sole basis for treatment or other Destiny management decisions. Negative results must be combined with clinical observations, Destiny history, and epidemiological information. The expected result is Negative. Fact Sheet for Patients: SugarRoll.be Fact Sheet for Healthcare Providers: https://www.woods-mathews.com/ This test is not yet approved or cleared by the [place] FDA and  has been authorized for detection and/or diagnosis of SARS-CoV-2 by FDA under an Emergency Use Authorization (EUA). This EUA will remain  in effect (meaning this test can be used) for the duration of the COVID-19 declaration under Section 56 4(b)(1) of the Act, 21 U.S.C. section 360bbb-3(b)(1), unless the authorization is terminated or revoked sooner. Performed at [facility], [place] [address] [place], [place] [address]      Studies: CT HEAD WO CONTRAST  Result Date: 02/05/2019 CLINICAL DATA:  Altered mental status EXAM: CT HEAD WITHOUT CONTRAST TECHNIQUE: Contiguous axial images were obtained from the base of the skull through the vertex without intravenous contrast. COMPARISON:  None. FINDINGS: Brain: Mild atrophic changes are noted. No findings to suggest acute hemorrhage, acute infarction or space-occupying mass lesion are noted. Vascular: No hyperdense vessel or unexpected calcification. Skull: Normal. Negative for fracture or focal lesion. Sinuses/Orbits: No acute finding. Other: None. IMPRESSION: Mild atrophic changes without acute abnormality. Electronically Signed   By: Inez Catalina M.D.   On: 02/05/2019 19:16    Scheduled Meds: . amLODipine  2.5 mg Oral Daily  . aspirin  81 mg Oral Daily  . carbidopa-levodopa  1 tablet Oral 5 times per day  .  Carbidopa-Levodopa ER  1 tablet Oral 3 times per day  . Carbidopa-Levodopa ER  2 tablet Oral 2 times per day  . fluconazole  150 mg Oral Weekly  . rOPINIRole  1 mg Oral TID    Continuous Infusions: . sodium chloride Stopped (02/05/19 1846)     Bonnell Public, MD  [facility] 02/07/2019

## 2019-02-08 ENCOUNTER — Inpatient Hospital Stay (HOSPITAL_COMMUNITY): Payer: Medicare Other

## 2019-02-08 LAB — PROCALCITONIN: Procalcitonin: 1.16 ng/mL

## 2019-02-08 MED ORDER — SODIUM CHLORIDE 0.9 % IV SOLN
2.0000 g | INTRAVENOUS | Status: DC
  Administered 2019-02-08: 2 g via INTRAVENOUS
  Filled 2019-02-08: qty 20

## 2019-02-08 MED ORDER — FUROSEMIDE 10 MG/ML IJ SOLN
40.0000 mg | Freq: Once | INTRAMUSCULAR | Status: AC
  Administered 2019-02-08: 40 mg via INTRAVENOUS
  Filled 2019-02-08: qty 4

## 2019-02-08 MED ORDER — SODIUM CHLORIDE 0.9 % IV SOLN
500.0000 mg | INTRAVENOUS | Status: DC
  Administered 2019-02-08: 500 mg via INTRAVENOUS
  Filled 2019-02-08 (×2): qty 500

## 2019-02-08 NOTE — Care Management Important Message (Signed)
Important Message  Patient Details  Name: Destiny Roth MRN: [id number] Date of Birth: 12-Dec-1950   Medicare Important Message Given:  Yes     Memory Argue 02/08/2019, 3:32 PM

## 2019-02-08 NOTE — Progress Notes (Signed)
PROGRESS NOTE  CASEE KNEPP EYE:[id number] DOB: 02/02/1950 DOA: 02/03/2019 PCP: No primary care provider on file.   LOS: 4 days   Brief narrative: As per HPI, Patient is a 69 year old female with past medical history significant for Parkinson's disease, restrictive lung disease, impaired glucose tolerance, IBS, hypertension, hyperlipidemia, diverticulosis and GERD  was admitted to the hospital with progressive weakness. On presentation to the hospital, patient was found to have calcium of 14.9, serum creatinine of 1.8, with associated volume depletion. Patient was on calcium supplement, vitamin D and multivitamin prior to presentation. Patient also reported associated diarrhea. Intact PTH was 5.   Work-up for the possible cause of hypercalcemia is in progress (suspect multifactorial, cannot entirely rule out underlying malignancy). Patient remains very weak, and mildly lethargic.  Subjective Awake alert no distress/confusion overnight eating and drinking can tell me where she is no fever no chills no nausea  Assessment/Plan:  Active Problems:   Essential hypertension   Hypercalcemia   Generalized weakness   AKI (acute kidney injury) ([place])  Hypercalcemia with mild metabolic encephalopathy.: Likely multifactorial secondary to hypercalcemia, volume depletion and AKI.  As per the patient, she was taking excessive calcium and vitamin D as outpatient for osteoporosis.  Myeloma work-up has been sent but hemoglobin is around 11.2.   Patient was counseled against excessive intake of calcium and vitamin D.  Calcium level has improved with hydration and micalcin.  Continue IV fluid hydration.  Intact PTH 5 and was low.  24 hydroxy vitamin D was 65.  COVID-19 test was negative.  PTH related peptide in progress.  TSH was 1.5.  Elevated kappa and lambda light chains which could be seen in any kind of infection but protein electrophoresis did not show any spike.  Spoke with Dr. Lindi Adie oncology who does  not feel that it is multiple myeloma but recommends screening for malignancy which could be done as outpatient.. CT of head 1/18 did not show any stroke  Pneumonia? Possible found on chest x-ray 2 view 1/11-procalcitonin was also elevated Start coverage with ceftriaxone and azithromycin monitor trends  Acute kidney injury- Cut back IV fluid to 50 cc an hour as patient is 6 L positive today Give one-time dose of Lasix BUN/creatinine 22/0.9 down from 26/1.0  Parkinson's disease: -Continue  sinemet, amantadine.  PT recommending home health  Weakness and lethargy: Likely secondary to volume depletion and hypercalcemia.  Physical therapy consulted.  We will continue as tolerated.  She is a lot more coherent  Essential hypertension: Consider to discontinue HCTZ on discharge due to hypercalcemia.  Will closely monitor blood pressure-no HCTZ on discharge on amlodipine  Mild hypokalemia.  Will replenish, check bmp in am.  Stable at 3.5  Visual hallucination: -This could be organic psychosis. -Patient has hypercalcemia, volume depletion and AKI.as resolved. -Continue to optimize above. -Started this admission very low-dose quetiapine.  Patient will be sensitive to antipsychotics.  See above documentation.  VTE Prophylaxis: Lovenox   Code Status: Full code  Family Communication: Husband.  Disposition Plan: Likely home with home PT.  Undetermined at this time.  Likely can discharge once transitioned to oral antibiotics in the next day day and a half   Consultants:  As per prior documentation "spoke with Dr. Lindi Adie hematooncology ".  Procedures:  None  Antibiotics:  Anti-infectives (From admission, onward)   Start     Dose/Rate Route Frequency Ordered Stop   02/08/19 1000  cefTRIAXone (ROCEPHIN) 2 g in sodium chloride 0.9 % 100 mL IVPB  2 g 200 mL/hr over 30 Minutes Intravenous Every 24 hours 02/08/19 0840 02/13/19 0959   02/08/19 1000  azithromycin (ZITHROMAX) 500 mg in  sodium chloride 0.9 % 250 mL IVPB     500 mg 250 mL/hr over 60 Minutes Intravenous Every 24 hours 02/08/19 0840 02/13/19 0959   02/07/19 1100  fluconazole (DIFLUCAN) tablet 150 mg     150 mg Oral Weekly 02/07/19 1034 02/21/19 1059     Subjective: No new complaints so far today. No visual hallucinations.  However, disease tends to occur towards evening time.  Objective: Vitals:   02/08/19 0748 02/08/19 0958  BP: (!) 164/77   Pulse: 92   Resp: 18   Temp: 97.7 F (36.5 C)   SpO2: (!) 89% 93%    Intake/Output Summary (Last 24 hours) at 02/08/2019 1330 Last data filed at 02/08/2019 1253 Gross per 24 hour  Intake 6298.21 ml  Output --  Net 6298.21 ml   Filed Weights   02/03/19 1815 02/04/19 0200  Weight: 59 kg 59 kg   Body mass index is 28.45 kg/m.   Physical Exam: Awake coherent no distress EOMI NCAT moves limbs equally no focal deficit S1-S2 no murmur Chest clear Abdomen soft nontender no rebound no guarding ROM intact slightly swollen bilaterally  Data Review: I have personally reviewed the following laboratory data and studies,  CBC: Recent Labs  Lab 02/03/19 1127 02/04/19 0446 02/05/19 0226 02/06/19 0920 02/07/19 0310  WBC 12.3* 8.2 10.0 8.9 8.6  NEUTROABS  --   --   --   --  6.6  HGB 14.5 12.3 11.2* 11.2* 11.3*  HCT 45.1 38.9 34.1* 35.1* 35.7*  MCV 92.6 92.8 91.2 91.6 92.0  PLT 378 301 285 298 154   Basic Metabolic Panel: Recent Labs  Lab 02/03/19 1127 02/04/19 0446 02/05/19 0226 02/05/19 0739 02/06/19 0920 02/07/19 0310  NA 134* 138 140  --  141 139  K 5.2* 4.6 3.4*  --  3.9 3.5  CL 101 104 109  --  112* 112*  CO2 19* 25 19*  --  19* 19*  GLUCOSE 118* 80 97  --  85 106*  BUN 50* 47* 26*  --  26* 22  CREATININE 1.88* 1.67* 1.00  --  1.00 0.93  CALCIUM 14.9* 12.4* 10.1 9.7 10.0 10.2  MG  --   --  1.4*  --  1.6* 1.6*  PHOS  --  2.8 2.0*  --  2.3* 2.8   Liver Function Tests: Recent Labs  Lab 02/04/19 0446 02/05/19 0226 02/06/19 0920  02/07/19 0310  AST  --   --  71*  --   ALT  --   --  12  --   ALKPHOS  --   --  100  --   BILITOT  --   --  0.4  --   PROT  --   --  4.9*  --   ALBUMIN 2.3* 2.1* 2.0* 1.9*   No results for input(s): LIPASE, AMYLASE in the last 168 hours. No results for input(s): AMMONIA in the last 168 hours. Cardiac Enzymes: No results for input(s): CKTOTAL, CKMB, CKMBINDEX, TROPONINI in the last 168 hours. BNP (last 3 results) No results for input(s): BNP in the last 8760 hours.  ProBNP (last 3 results) No results for input(s): PROBNP in the last 8760 hours.  CBG: Recent Labs  Lab 02/03/19 1340 02/04/19 0902  GLUCAP 122* 93   Recent Results (from the past 240 hour(s))  SARS  CORONAVIRUS 2 (TAT 6-24 HRS) Nasopharyngeal Nasopharyngeal Swab     Status: None   Collection Time: 02/03/19  2:08 PM   Specimen: Nasopharyngeal Swab  Result Value Ref Range Status   SARS Coronavirus 2 NEGATIVE NEGATIVE Final    Comment: (NOTE) SARS-CoV-2 target nucleic acids are NOT DETECTED. The SARS-CoV-2 RNA is generally detectable in upper and lower respiratory specimens during the acute phase of infection. Negative results do not preclude SARS-CoV-2 infection, do not rule out co-infections with other pathogens, and should not be used as the sole basis for treatment or other patient management decisions. Negative results must be combined with clinical observations, patient history, and epidemiological information. The expected result is Negative. Fact Sheet for Patients: SugarRoll.be Fact Sheet for Healthcare Providers: https://www.woods-mathews.com/ This test is not yet approved or cleared by the [place] FDA and  has been authorized for detection and/or diagnosis of SARS-CoV-2 by FDA under an Emergency Use Authorization (EUA). This EUA will remain  in effect (meaning this test can be used) for the duration of the COVID-19 declaration under Section 56 4(b)(1) of  the Act, 21 U.S.C. section 360bbb-3(b)(1), unless the authorization is terminated or revoked sooner. Performed at [facility], [place] [address] [place], [place] [address]   Aerobic/Anaerobic Culture (surgical/deep wound)     Status: None (Preliminary result)   Collection Time: 02/07/19 11:03 AM   Specimen: Vaginal  Result Value Ref Range Status   Specimen Description VAGINA  Final   Special Requests LOOKING FOR YEAST  Final   Gram Stain   Final    NO WBC SEEN ABUNDANT GRAM POSITIVE RODS MODERATE GRAM POSITIVE COCCI IN PAIRS IN CLUSTERS FEW GRAM NEGATIVE RODS    Culture   Final    FEW GRAM NEGATIVE RODS CULTURE REINCUBATED FOR BETTER GROWTH Performed at [facility], [place] [address], [place], [place] [address]    Report Status PENDING  Incomplete     Studies: DG Chest 2 View  Result Date: 02/08/2019 CLINICAL DATA:  Shortness of breath and wheezing EXAM: CHEST - 2 VIEW COMPARISON:  February 07, 2019 FINDINGS: There is airspace opacity throughout much of the right lower lobe. There may be mild pleural effusions superimposed on the right. There is atelectatic change in the left base. Heart size and pulmonary vascularity within normal limits. No adenopathy. There is aortic atherosclerosis. No bone lesions. IMPRESSION: Right lower lobe airspace opacity consistent with pneumonia. Suspect small pleural effusions superimposed on the right. Atelectatic change left base. Heart size within normal limits. No adenopathy appreciable. Aortic Atherosclerosis (ICD10-I70.0). Electronically Signed   By: Lowella Grip III M.D.   On: 02/08/2019 08:09   DG CHEST PORT 1 VIEW  Result Date: 02/07/2019 CLINICAL DATA:  Shortness of breath EXAM: PORTABLE CHEST 1 VIEW COMPARISON:  February 24, 2013 FINDINGS: No pneumothorax. The heart, hila, and mediastinum are normal. Left lung is clear. Haziness over the right base is identified, asymmetric to the left. IMPRESSION: Haziness over the right base,  asymmetric to the left, could represent atelectasis or developing infiltrate. A PA and lateral chest x-ray may better evaluate. No other acute abnormalities identified. Electronically Signed   By: Dorise Bullion III M.D   On: 02/07/2019 12:52    Scheduled Meds: . amLODipine  2.5 mg Oral Daily  . aspirin  81 mg Oral Daily  . carbidopa-levodopa  1 tablet Oral 5 times per day  . Carbidopa-Levodopa ER  1 tablet Oral 3 times per day  .  Carbidopa-Levodopa ER  2 tablet Oral 2 times per day  . fluconazole  150 mg Oral Weekly  . furosemide  40 mg Intravenous Once  . rOPINIRole  1 mg Oral TID    Continuous Infusions: . sodium chloride 125 mL/hr at 02/08/19 0503  . azithromycin 500 mg (02/08/19 1102)  . cefTRIAXone (ROCEPHIN)  IV 2 g (02/08/19 7741)     Nita Sells, MD  [facility] 02/08/2019

## 2019-02-08 NOTE — Plan of Care (Signed)
  Problem: Clinical Measurements: Goal: Respiratory complications will improve Outcome: Progressing   Problem: Activity: Goal: Risk for activity intolerance will decrease Outcome: Progressing   Problem: Safety: Goal: Ability to remain free from injury will improve Outcome: Progressing   

## 2019-02-08 NOTE — Plan of Care (Signed)

## 2019-02-08 NOTE — Progress Notes (Signed)
Occupational Therapy Treatment Patient Details Name: Destiny Roth MRN: [id number] DOB: 1950/03/07 Today's Date: 02/08/2019    History of present illness 69 year old female admitted with progressive weakness with hypercalcemia. PMHx: Parkinson's disease, restrictive lung disease, impaired glucose tolerance, IBS, HTN, HLD, diverticulosis and GERD.   OT comments  Pt making progress with functional goals. Pt requires cues for correct hand placement and safety during ADL mobility to bathroom using RW. Pt's husband present and encouraging pt to be more active. OT will continue to follow pt acutely  Follow Up Recommendations  Home health OT;Supervision/Assistance - 24 hour    Equipment Recommendations  3 in 1 bedside commode    Recommendations for Other Services      Precautions / Restrictions Precautions Precautions: Fall Restrictions Weight Bearing Restrictions: No       Mobility Bed Mobility Overal bed mobility: Needs Assistance Bed Mobility: Supine to Sit;Sit to Supine     Supine to sit: Min guard;HOB elevated Sit to supine: Min guard   General bed mobility comments: HOB 30 degrees with use of rail and increased time, guarding for safety  Transfers Overall transfer level: Needs assistance Equipment used: Rolling walker (2 wheeled) Transfers: Sit to/from Stand Sit to Stand: Min guard         General transfer comment: cues for hand placement to rise from bed and toilet    Balance Overall balance assessment: Needs assistance Sitting-balance support: Bilateral upper extremity supported Sitting balance-Leahy Scale: Good     Standing balance support: Bilateral upper extremity supported;During functional activity Standing balance-Leahy Scale: Poor                             ADL either performed or assessed with clinical judgement   ADL Overall ADL's : Needs assistance/impaired Eating/Feeding: Set up;Sitting   Grooming: Wash/dry face;Wash/dry  hands;Min guard;Standing   Upper Body Bathing: Set up;Sitting Upper Body Bathing Details (indicate cue type and reason): simulated Lower Body Bathing: Moderate assistance;Sit to/from stand Lower Body Bathing Details (indicate cue type and reason): simulated Upper Body Dressing : Set up;Sitting       Toilet Transfer: Min guard;Regular Toilet;Grab bars;Cueing for sequencing;Ambulation;RW;Cueing for safety   Toileting- Clothing Manipulation and Hygiene: Min guard;Sit to/from stand   Tub/ Banker: Walk-in shower;Ambulation;Rolling walker;Grab bars;3 in 1;Min guard   Functional mobility during ADLs: Cueing for safety;Cueing for sequencing;Rolling walker;Min guard       Vision Patient Visual Report: No change from baseline     Perception     Praxis      Cognition Arousal/Alertness: Awake/alert Behavior During Therapy: WFL for tasks assessed/performed Overall Cognitive Status: Impaired/Different from baseline Area of Impairment: Problem solving                   Current Attention Level: Selective Memory: Decreased short-term memory Following Commands: Follows one step commands consistently     Problem Solving: Requires verbal cues          Exercises     Shoulder Instructions       General Comments      Pertinent Vitals/ Pain       Pain Assessment: No/denies pain Faces Pain Scale: No hurt Pain Intervention(s): Monitored during session  Home Living  Prior Functioning/Environment              Frequency  Min 2X/week        Progress Toward Goals  OT Goals(current goals can now be found in the care plan section)  Progress towards OT goals: Progressing toward goals  Acute Rehab OT Goals Patient Stated Goal: return home OT Goal Formulation: With patient/family  Plan Discharge plan remains appropriate    Co-evaluation                 AM-PAC OT "6 Clicks" Daily  Activity     Outcome Measure   Help from another person eating meals?: None Help from another person taking care of personal grooming?: A Little Help from another person toileting, which includes using toliet, bedpan, or urinal?: A Lot Help from another person bathing (including washing, rinsing, drying)?: A Lot Help from another person to put on and taking off regular upper body clothing?: A Little Help from another person to put on and taking off regular lower body clothing?: A Lot 6 Click Score: 16    End of Session Equipment Utilized During Treatment: Gait belt;Rolling walker;Other (comment)(3 in 1)  OT Visit Diagnosis: Muscle weakness (generalized) (M62.81);Other symptoms and signs involving cognitive function   Activity Tolerance Patient tolerated treatment well   Patient Left in bed;with call bell/phone within reach;with bed alarm set;with family/visitor present   Nurse Communication          Time: [id number] OT Time Calculation (min): 27 min  Charges: OT General Charges $OT Visit: 1 Visit OT Treatments $Self Care/Home Management : 8-22 mins $Therapeutic Activity: 8-22 mins     Britt Bottom 02/08/2019, 1:43 PM

## 2019-02-08 NOTE — Progress Notes (Addendum)
Physical Therapy Treatment Patient Details Name: Destiny Roth MRN: [id number] DOB: August 04, 1950 Today's Date: 02/08/2019    History of Present Illness 69 year old female admitted with progressive weakness with hypercalcemia. PMHx: Parkinson's disease, restrictive lung disease, impaired glucose tolerance, IBS, HTN, HLD, diverticulosis and GERD.    PT Comments    Pt pleasant struggling with eating breakfast in supine on arrival without ability to problem solve need to sit further or be OOB. Pt without hallucinations and with improved cognition from prior sessions but still slow to process. Pt educated for transfers, progression, gait and HEP. D/c home with family remains appropriate with youth RW.  Pt with HR 112 with activity with SPO2 90% with cues for pursed lip breathing achieving 92% Pt educated for IS use and only able to achieve 500cc   Follow Up Recommendations  Home health PT;Supervision/Assistance - 24 hour     Equipment Recommendations  Rolling walker with 5" wheels;3in1 (PT)(youth)    Recommendations for Other Services       Precautions / Restrictions Precautions Precautions: Fall    Mobility  Bed Mobility Overal bed mobility: Needs Assistance Bed Mobility: Supine to Sit     Supine to sit: Min guard;HOB elevated     General bed mobility comments: HOB 30 degrees with use of rail and increased time, guarding for safety  Transfers Overall transfer level: Needs assistance   Transfers: Sit to/from Stand Sit to Stand: Min guard         General transfer comment: cues for hand placement to rise from bed and lower to toilet and chair  Ambulation/Gait Ambulation/Gait assistance: Min assist Gait Distance (Feet): 40 Feet Assistive device: Rolling walker (2 wheeled) Gait Pattern/deviations: Step-through pattern;Decreased stride length;Trunk flexed   Gait velocity interpretation: 1.31 - 2.62 ft/sec, indicative of limited community ambulator General Gait  Details: cues for safe use of RW and maintaining safe proximity; assist to steady and guide RW . Pt walked 20' then 48' limited by fatigue   Stairs             Wheelchair Mobility    Modified Rankin (Stroke Patients Only)       Balance Overall balance assessment: Needs assistance   Sitting balance-Leahy Scale: Good Sitting balance - Comments: no UE support EOB and at toilet   Standing balance support: Bilateral upper extremity supported Standing balance-Leahy Scale: Poor Standing balance comment: UE support for gait                            Cognition Arousal/Alertness: Awake/alert Behavior During Therapy: Flat affect Overall Cognitive Status: Impaired/Different from baseline Area of Impairment: Problem solving                   Current Attention Level: Selective Memory: Decreased short-term memory Following Commands: Follows one step commands consistently       General Comments: pt oriented without hallucination this session. Continues to state "I was born [birth year]" "      Exercises General Exercises - Lower Extremity Long Arc Quad: AROM;Both;Seated;15 reps Hip Flexion/Marching: AROM;Both;15 reps;Seated    General Comments        Pertinent Vitals/Pain Pain Assessment: No/denies pain    Home Living                      Prior Function            PT Goals (current goals can now be found in  the care plan section) Progress towards PT goals: Progressing toward goals    Frequency           PT Plan Current plan remains appropriate    Co-evaluation              AM-PAC PT "6 Clicks" Mobility   Outcome Measure  Help needed turning from your back to your side while in a flat bed without using bedrails?: A Little Help needed moving from lying on your back to sitting on the side of a flat bed without using bedrails?: A Little Help needed moving to and from a bed to a chair (including a wheelchair)?: A Little Help  needed standing up from a chair using your arms (e.g., wheelchair or bedside chair)?: A Little Help needed to walk in hospital room?: A Little Help needed climbing 3-5 steps with a railing? : A Little 6 Click Score: 18    End of Session Equipment Utilized During Treatment: Gait belt Activity Tolerance: Patient tolerated treatment well Patient left: in chair;with call bell/phone within reach;with chair alarm set Nurse Communication: Mobility status PT Visit Diagnosis: Other abnormalities of gait and mobility (R26.89);Muscle weakness (generalized) (M62.81)     Time: [id number] PT Time Calculation (min) (ACUTE ONLY): 26 min  Charges:  $Gait Training: 8-22 mins $Therapeutic Activity: 8-22 mins                     Seini Lannom P, PT Acute Rehabilitation Services Pager: [phone] Office: [place] 02/08/2019, 8:28 AM

## 2019-02-09 ENCOUNTER — Inpatient Hospital Stay (HOSPITAL_COMMUNITY): Payer: Medicare Other

## 2019-02-09 LAB — CBC WITH DIFFERENTIAL/PLATELET
Abs Immature Granulocytes: 0.11 10*3/uL — ABNORMAL HIGH (ref 0.00–0.07)
Basophils Absolute: 0 10*3/uL (ref 0.0–0.1)
Basophils Relative: 1 %
Eosinophils Absolute: 0.6 10*3/uL — ABNORMAL HIGH (ref 0.0–0.5)
Eosinophils Relative: 8 %
HCT: 35.5 % — ABNORMAL LOW (ref 36.0–46.0)
Hemoglobin: 11.2 g/dL — ABNORMAL LOW (ref 12.0–15.0)
Immature Granulocytes: 2 %
Lymphocytes Relative: 9 %
Lymphs Abs: 0.6 10*3/uL — ABNORMAL LOW (ref 0.7–4.0)
MCH: 28.9 pg (ref 26.0–34.0)
MCHC: 31.5 g/dL (ref 30.0–36.0)
MCV: 91.7 fL (ref 80.0–100.0)
Monocytes Absolute: 0.5 10*3/uL (ref 0.1–1.0)
Monocytes Relative: 7 %
Neutro Abs: 5.2 10*3/uL (ref 1.7–7.7)
Neutrophils Relative %: 73 %
Platelets: 265 10*3/uL (ref 150–400)
RBC: 3.87 MIL/uL (ref 3.87–5.11)
RDW: 14.1 % (ref 11.5–15.5)
WBC: 7 10*3/uL (ref 4.0–10.5)
nRBC: 0 % (ref 0.0–0.2)

## 2019-02-09 LAB — PROCALCITONIN: Procalcitonin: 1.1 ng/mL

## 2019-02-09 LAB — RENAL FUNCTION PANEL
Albumin: 1.8 g/dL — ABNORMAL LOW (ref 3.5–5.0)
Anion gap: 10 (ref 5–15)
BUN: 21 mg/dL (ref 8–23)
CO2: 21 mmol/L — ABNORMAL LOW (ref 22–32)
Calcium: 10.2 mg/dL (ref 8.9–10.3)
Chloride: 111 mmol/L (ref 98–111)
Creatinine, Ser: 0.96 mg/dL (ref 0.44–1.00)
GFR calc Af Amer: >60 mL/min (ref >60)
GFR calc non Af Amer: 60 mL/min — ABNORMAL LOW (ref >60)
Glucose, Bld: 107 mg/dL — ABNORMAL HIGH (ref 70–99)
Phosphorus: 4 mg/dL (ref 2.5–4.6)
Potassium: 2.8 mmol/L — ABNORMAL LOW (ref 3.5–5.1)
Sodium: 142 mmol/L (ref 135–145)

## 2019-02-09 MED ORDER — IOHEXOL 300 MG/ML  SOLN
100.0000 mL | Freq: Once | INTRAMUSCULAR | Status: AC | PRN
  Administered 2019-02-09: 100 mL via INTRAVENOUS

## 2019-02-09 MED ORDER — POTASSIUM CHLORIDE CRYS ER 20 MEQ PO TBCR
40.0000 meq | EXTENDED_RELEASE_TABLET | Freq: Two times a day (BID) | ORAL | Status: DC
  Administered 2019-02-09 – 2019-02-11 (×5): 40 meq via ORAL
  Filled 2019-02-09 (×5): qty 2

## 2019-02-09 MED ORDER — FUROSEMIDE 40 MG PO TABS
40.0000 mg | ORAL_TABLET | Freq: Every day | ORAL | Status: DC
  Administered 2019-02-09 – 2019-02-10 (×2): 40 mg via ORAL
  Filled 2019-02-09 (×2): qty 1

## 2019-02-09 MED ORDER — AMLODIPINE BESYLATE 10 MG PO TABS
10.0000 mg | ORAL_TABLET | Freq: Every day | ORAL | Status: DC
  Administered 2019-02-10 – 2019-02-11 (×2): 10 mg via ORAL
  Filled 2019-02-09 (×2): qty 1

## 2019-02-09 MED ORDER — CEFDINIR 300 MG PO CAPS
300.0000 mg | ORAL_CAPSULE | Freq: Two times a day (BID) | ORAL | Status: DC
  Administered 2019-02-09 – 2019-02-11 (×5): 300 mg via ORAL
  Filled 2019-02-09 (×6): qty 1

## 2019-02-09 MED ORDER — LEVOFLOXACIN 500 MG PO TABS: 500.0000 mg | ORAL_TABLET | Freq: Every day | ORAL | Status: DC

## 2019-02-09 MED ORDER — METOPROLOL TARTRATE 25 MG PO TABS
25.0000 mg | ORAL_TABLET | Freq: Once | ORAL | Status: AC
  Administered 2019-02-09: 25 mg via ORAL
  Filled 2019-02-09: qty 1

## 2019-02-09 MED ORDER — LOPERAMIDE HCL 2 MG PO CAPS
2.0000 mg | ORAL_CAPSULE | Freq: Two times a day (BID) | ORAL | Status: DC | PRN
  Administered 2019-02-09: 2 mg via ORAL
  Filled 2019-02-09: qty 1

## 2019-02-09 NOTE — Progress Notes (Addendum)
52: Notified Dr. Sharlet Salina with BP 175/84, pt VS taken after pt was changed, will wait a few minutes and retake BP once pt is relaxed and settled. Will continue to monitor pt.  0327: Notified Dr. Sharlet Salina with Retake BP of 185/88. Pt hands are a little puffy. Will continue to monitor until advised otherwise.

## 2019-02-09 NOTE — Progress Notes (Addendum)
24 hour urine started at 1pm. Pt understands to call when needing to use restroom.  RN received verbal order to stop d/c 24 hour urine.

## 2019-02-09 NOTE — Progress Notes (Signed)
PROGRESS NOTE  Destiny Roth [id number] DOB: Jan 05, 1951 DOA: 02/03/2019 PCP: No primary care provider on file.   LOS: 5 days   Brief narrative: 69 year old female with past medical history significant for Parkinson's disease, restrictive lung disease, impaired glucose tolerance, IBS, hypertension, hyperlipidemia, diverticulosis and GERD  was admitted to the hospital with progressive weakness. On presentation to the hospital, patient was found to have calcium of 14.9, serum creatinine of 1.8, with associated volume depletion. Patient was on calcium supplement, vitamin D and multivitamin prior to presentation. Patient also reported associated diarrhea. Intact PTH was 5.  Work-up for the possible cause of hypercalcemia is in progress (suspect multifactorial, cannot entirely rule out underlying malignancy). Patient remains very weak, and mildly lethargic.  Subjective Coherent no distress eating drinking feels swollen still some concerns about using the restroom No nausea no vomiting  Assessment/Plan:  Active Problems:   Essential hypertension   Hypercalcemia   Generalized weakness   AKI (acute kidney injury) ([place])  Hypercalcemia with mild metabolic encephalopathy.: Likely multifactorial secondary to hypercalcemia, volume depletion and AKI.  As per the patient, she was taking excessive calcium and vitamin D as outpatient for osteoporosis.   Myeloma work-up has been sent but hemoglobin is around 11.2.    Patient was counseled against excessive intake of calcium and vitamin D.  Given hydration, Mialcin .  Intact PTH 5 and was low.  24 hydroxy vitamin D was 65.   P TRH pending TSH normal I discussed again with Dr. Lindi Adie on 1/12 and he recommends based on persisting low-grade hypercalcemia CT chest abdomen pelvis with contrast We may use a bisphosphonate in the next 24 to 48 hours  Pneumonia? Possible found on chest x-ray 2 view 1/11-procalcitonin was also elevated Transition this  a.m. to cefdinir  Acute kidney injury- DC IVF Lasix 40 daily BUN/creatinine 22/0.9 down from 26/1.0-->21/0.9  Parkinson's disease: -Continue  sinemet, amantadine.  PT recommending home health  Weakness and lethargy: Likely secondary to volume depletion and hypercalcemia.  Physical therapy consulted.  We will continue as tolerated.  She is a lot more coherent  Essential hypertension: Consider to discontinue HCTZ on discharge due to hypercalcemia.  Will closely monitor blood pressure-no HCTZ on discharge on amlodipine  Mild hypokalemia.  Increase potassium to 40 twice daily given potassiumthree 2.8  Visual hallucination: -This could be organic psychosis. -Patient has hypercalcemia which could have also caused this -Started this admission very low-dose quetiapine.  Patient will be sensitive to antipsychotics.  See above documentation.  VTE Prophylaxis: Lovenox   Code Status: Full code  Family Communication: Husband  At the bedside I called  Disposition Plan: Likely home with home PT.  Undetermined at this time.  Await CT of chest abdomen pelvis and determine dispo based on that as she is from out of town and unable to get close follow-up with her primary physician readily as this person is at [employer]:  As per prior documentation "spoke with Dr. Lindi Adie hematooncology ".  Procedures:  None  Antibiotics:  Anti-infectives (From admission, onward)   Start     Dose/Rate Route Frequency Ordered Stop   02/09/19 1000  levofloxacin (LEVAQUIN) tablet 500 mg  Status:  Discontinued     500 mg Oral Daily 02/09/19 0827 02/09/19 0905   02/09/19 1000  cefdinir (OMNICEF) capsule 300 mg     300 mg Oral Every 12 hours 02/09/19 0906     02/08/19 1000  cefTRIAXone (ROCEPHIN) 2 g in sodium chloride 0.9 %  100 mL IVPB  Status:  Discontinued     2 g 200 mL/hr over 30 Minutes Intravenous Every 24 hours 02/08/19 0840 02/09/19 0827   02/08/19 1000  azithromycin (ZITHROMAX) 500 mg in  sodium chloride 0.9 % 250 mL IVPB  Status:  Discontinued     500 mg 250 mL/hr over 60 Minutes Intravenous Every 24 hours 02/08/19 0840 02/09/19 0827   02/07/19 1100  fluconazole (DIFLUCAN) tablet 150 mg  Status:  Discontinued     150 mg Oral Weekly 02/07/19 1034 02/09/19 1110     Subjective: No new complaints so far today. No visual hallucinations.  However, disease tends to occur towards evening time.  Objective: Vitals:   02/09/19 0603 02/09/19 0736  BP: (!) 178/85 (!) 155/72  Pulse: 85 78  Resp:  18  Temp: 99 F (37.2 C) 98.7 F (37.1 C)  SpO2: 91% 92%    Intake/Output Summary (Last 24 hours) at 02/09/2019 1214 Last data filed at 02/09/2019 0821 Gross per 24 hour  Intake 1955.39 ml  Output --  Net 1955.39 ml   Filed Weights   02/03/19 1815 02/04/19 0200  Weight: 59 kg 59 kg   Body mass index is 28.45 kg/m.   Physical Exam: Awake coherent pleasant no distress S1-S2 no murmur Slight swelling of upper extremities lower extremities Chest relatively clear Abdomen soft nontender no rebound or guarding Neurologically intact no focal deficit  Data Review: I have personally reviewed the following laboratory data and studies,  CBC: Recent Labs  Lab 02/04/19 0446 02/05/19 0226 02/06/19 0920 02/07/19 0310 02/09/19 0437  WBC 8.2 10.0 8.9 8.6 7.0  NEUTROABS  --   --   --  6.6 5.2  HGB 12.3 11.2* 11.2* 11.3* 11.2*  HCT 38.9 34.1* 35.1* 35.7* 35.5*  MCV 92.8 91.2 91.6 92.0 91.7  PLT 301 285 298 282 [id number]   Basic Metabolic Panel: Recent Labs  Lab 02/04/19 0446 02/05/19 0226 02/05/19 0739 02/06/19 0920 02/07/19 0310 02/09/19 0437  NA 138 140  --  141 139 142  K 4.6 3.4*  --  3.9 3.5 2.8*  CL 104 109  --  112* 112* 111  CO2 25 19*  --  19* 19* 21*  GLUCOSE 80 97  --  85 106* 107*  BUN 47* 26*  --  26* 22 21  CREATININE 1.67* 1.00  --  1.00 0.93 0.96  CALCIUM 12.4* 10.1 9.7 10.0 10.2 10.2  MG  --  1.4*  --  1.6* 1.6*  --   PHOS 2.8 2.0*  --  2.3* 2.8 4.0    Liver Function Tests: Recent Labs  Lab 02/04/19 0446 02/05/19 0226 02/06/19 0920 02/07/19 0310 02/09/19 0437  AST  --   --  71*  --   --   ALT  --   --  12  --   --   ALKPHOS  --   --  100  --   --   BILITOT  --   --  0.4  --   --   PROT  --   --  4.9*  --   --   ALBUMIN 2.3* 2.1* 2.0* 1.9* 1.8*   No results for input(s): LIPASE, AMYLASE in the last 168 hours. No results for input(s): AMMONIA in the last 168 hours. Cardiac Enzymes: No results for input(s): CKTOTAL, CKMB, CKMBINDEX, TROPONINI in the last 168 hours. BNP (last 3 results) No results for input(s): BNP in the last 8760 hours.  ProBNP (last  3 results) No results for input(s): PROBNP in the last 8760 hours.  CBG: Recent Labs  Lab 02/03/19 1340 02/04/19 0902  GLUCAP 122* 93   Recent Results (from the past 240 hour(s))  SARS CORONAVIRUS 2 (TAT 6-24 HRS) Nasopharyngeal Nasopharyngeal Swab     Status: None   Collection Time: 02/03/19  2:08 PM   Specimen: Nasopharyngeal Swab  Result Value Ref Range Status   SARS Coronavirus 2 NEGATIVE NEGATIVE Final    Comment: (NOTE) SARS-CoV-2 target nucleic acids are NOT DETECTED. The SARS-CoV-2 RNA is generally detectable in upper and lower respiratory specimens during the acute phase of infection. Negative results do not preclude SARS-CoV-2 infection, do not rule out co-infections with other pathogens, and should not be used as the sole basis for treatment or other patient management decisions. Negative results must be combined with clinical observations, patient history, and epidemiological information. The expected result is Negative. Fact Sheet for Patients: SugarRoll.be Fact Sheet for Healthcare Providers: https://www.woods-mathews.com/ This test is not yet approved or cleared by the [place] FDA and  has been authorized for detection and/or diagnosis of SARS-CoV-2 by FDA under an Emergency Use Authorization (EUA).  This EUA will remain  in effect (meaning this test can be used) for the duration of the COVID-19 declaration under Section 56 4(b)(1) of the Act, 21 U.S.C. section 360bbb-3(b)(1), unless the authorization is terminated or revoked sooner. Performed at [facility], [place] [address], [place], [place] [address]   Aerobic/Anaerobic Culture (surgical/deep wound)     Status: None (Preliminary result)   Collection Time: 02/07/19 11:03 AM   Specimen: Vaginal  Result Value Ref Range Status   Specimen Description VAGINA  Final   Special Requests LOOKING FOR YEAST  Final   Gram Stain   Final    NO WBC SEEN ABUNDANT GRAM POSITIVE RODS MODERATE GRAM POSITIVE COCCI IN PAIRS IN CLUSTERS FEW GRAM NEGATIVE RODS Performed at [facility], [place] [address] [place], [place] [address]    Culture FEW ESCHERICHIA COLI  Final   Report Status PENDING  Incomplete   Organism ID, Bacteria ESCHERICHIA COLI  Final      Susceptibility   Escherichia coli - MIC*    AMPICILLIN 4 SENSITIVE Sensitive     CEFAZOLIN <=4 SENSITIVE Sensitive     CEFEPIME <=0.12 SENSITIVE Sensitive     CEFTAZIDIME <=1 SENSITIVE Sensitive     CEFTRIAXONE <=0.25 SENSITIVE Sensitive     CIPROFLOXACIN <=0.25 SENSITIVE Sensitive     GENTAMICIN <=1 SENSITIVE Sensitive     IMIPENEM <=0.25 SENSITIVE Sensitive     TRIMETH/SULFA <=20 SENSITIVE Sensitive     AMPICILLIN/SULBACTAM <=2 SENSITIVE Sensitive     PIP/TAZO <=4 SENSITIVE Sensitive     * FEW ESCHERICHIA COLI     Studies: DG Chest 2 View  Result Date: 02/08/2019 CLINICAL DATA:  Shortness of breath and wheezing EXAM: CHEST - 2 VIEW COMPARISON:  February 07, 2019 FINDINGS: There is airspace opacity throughout much of the right lower lobe. There may be mild pleural effusions superimposed on the right. There is atelectatic change in the left base. Heart size and pulmonary vascularity within normal limits. No adenopathy. There is aortic atherosclerosis. No bone lesions. IMPRESSION:  Right lower lobe airspace opacity consistent with pneumonia. Suspect small pleural effusions superimposed on the right. Atelectatic change left base. Heart size within normal limits. No adenopathy appreciable. Aortic Atherosclerosis (ICD10-I70.0). Electronically Signed   By: Lowella Grip III M.D.   On: 02/08/2019 08:09  DG CHEST PORT 1 VIEW  Result Date: 02/07/2019 CLINICAL DATA:  Shortness of breath EXAM: PORTABLE CHEST 1 VIEW COMPARISON:  February 24, 2013 FINDINGS: No pneumothorax. The heart, hila, and mediastinum are normal. Left lung is clear. Haziness over the right base is identified, asymmetric to the left. IMPRESSION: Haziness over the right base, asymmetric to the left, could represent atelectasis or developing infiltrate. A PA and lateral chest x-ray may better evaluate. No other acute abnormalities identified. Electronically Signed   By: Dorise Bullion III M.D   On: 02/07/2019 12:52    Scheduled Meds: . Derrill Memo ON 02/10/2019] amLODipine  10 mg Oral Daily  . aspirin  81 mg Oral Daily  . carbidopa-levodopa  1 tablet Oral 5 times per day  . Carbidopa-Levodopa ER  1 tablet Oral 3 times per day  . Carbidopa-Levodopa ER  2 tablet Oral 2 times per day  . cefdinir  300 mg Oral Q12H  . furosemide  40 mg Oral Daily  . potassium chloride  40 mEq Oral BID  . rOPINIRole  1 mg Oral TID    Nita Sells, MD  [facility] 02/09/2019

## 2019-02-09 NOTE — TOC Initial Note (Signed)
Transition of Care [facility]) - Initial/Assessment Note    Patient Details  Name: Destiny Roth MRN: [id number] Date of Birth: 04-06-1950  Transition of Care [facility]) CM/SW Contact:    Sharin Mons, RN Phone Number: [phone] 02/09/2019, 5:17 PM  Clinical Narrative:    Admitted with progressive weakness with hypercalcemia. PMHx: Parkinson's disease, restrictive lung disease, impaired glucose tolerance, IBS, HTN, HLD, diverticulosis and GERD.       From home with home husband. NCM shared PT/OT evaluations with both pt and husband. Recommendations: Home health PT;Supervision/Assistance - 24 hour,Home health OT;Supervision/Assistance - 24 hour. Husband agreeable to home health services. Wife stated she would go along with what husband agrees to.  NCM offered choice to pt/husband. Both without preference. Referral made with [facility] for home health services and accepted pending MD's orders.   DME : rolling walker and BSC will be delivered to bedside prior to d/c.  TOC team will continue to monitor for needs ...    Expected Discharge Plan: [place] Barriers to Discharge: Continued Medical Work up   Patient Goals and CMS Choice     Choice offered to / list presented to : Patient, Spouse  Expected Discharge Plan and Services Expected Discharge Plan: [place]   Discharge Planning Services: CM Consult                     DME Arranged: 3-N-1, Walker rolling DME Agency: [employer] Date DME Agency Contacted: 02/09/19 Time DME Agency Contacted: 88   HH Arranged: PT, OT, RN, Nurse's Aide, pending MD's orders HH Agency: [place]) Date [place]: 02/09/19 Time [place]: 1716 Representative spoke with at [place]: [place] Arrangements/Services                       Activities of Daily Living Home Assistive Devices/Equipment: None ADL Screening (condition at time of  admission) Patient's cognitive ability adequate to safely complete daily activities?: No Is the patient deaf or have difficulty hearing?: No Does the patient have difficulty seeing, even when wearing glasses/contacts?: Yes Does the patient have difficulty concentrating, remembering, or making decisions?: Yes Patient able to express need for assistance with ADLs?: Yes Does the patient have difficulty dressing or bathing?: Yes Independently performs ADLs?: Yes (appropriate for developmental age) Does the patient have difficulty walking or climbing stairs?: Yes Weakness of Legs: Right Weakness of Arms/Hands: Right  Permission Sought/Granted                  Emotional Assessment     Admission diagnosis:  Hypercalcemia [E83.52] Generalized weakness [R53.1] AKI (acute kidney injury) ([place]) [N17.9] Patient Active Problem List   Diagnosis Date Noted  . Hypercalcemia 02/04/2019  . Generalized weakness 02/04/2019  . AKI (acute kidney injury) ([place]) 02/04/2019  . Vertigo 03/12/2016  . Chest congestion 02/24/2015  . Peripheral edema 07/09/2014  . Restrictive lung disease 12/20/2013  . DOE (dyspnea on exertion) 11/18/2013  . Hot flashes 11/18/2013  . Chest pain 08/04/2013  . Memory impairment 08/04/2013  . Orthostasis 02/26/2013  . Tachycardia 02/24/2013  . Coronary artery calcification seen on CAT scan 01/01/2013  . Tendonitis, calcific, shoulder 01/01/2013  . OSA (obstructive sleep apnea) 01/01/2013  . PD (Parkinson's disease) ([place]) 11/26/2011  . Hematochezia 11/09/2011  . Colon polyps 11/09/2011  . IBS (irritable bowel syndrome) 11/09/2011  . Anosmia 11/06/2010  . Impaired glucose tolerance 11/04/2010  .  Hearing loss 05/07/2010  . Preventative health care 05/07/2010  . BUNION, RIGHT FOOT 09/18/2009  . GERD 05/24/2008  . OSTEOPOROSIS 05/24/2008  . Hyperlipidemia 09/23/2006  . Anxiety state 09/23/2006  . Depression 09/23/2006  . CARPAL TUNNEL SYNDROME, BILATERAL 09/23/2006   . Essential hypertension 09/23/2006  . DIVERTICULOSIS, COLON 09/23/2006   PCP:  No primary care provider on file. Pharmacy:   [facility] [address] ([address] [place] [id number] [place] [address] Phone: [phone] Fax: [phone]  [facility] [place], [place] [address] [place] [address] Phone: [phone] Fax: [phone]  [id number] [address], [place] [address] Phone: [phone] Fax: [phone]     Social Determinants of Health (SDOH) Interventions    Readmission Risk Interventions No flowsheet data found.

## 2019-02-09 NOTE — Plan of Care (Signed)

## 2019-02-09 NOTE — Progress Notes (Signed)
Pt's husband requested secondary insurance be added. Called admission department to add supplemental plan.

## 2019-02-10 LAB — CBC WITH DIFFERENTIAL/PLATELET
Abs Immature Granulocytes: 0.06 10*3/uL (ref 0.00–0.07)
Basophils Absolute: 0 10*3/uL (ref 0.0–0.1)
Basophils Relative: 1 %
Eosinophils Absolute: 0.7 10*3/uL — ABNORMAL HIGH (ref 0.0–0.5)
Eosinophils Relative: 11 %
HCT: 34.7 % — ABNORMAL LOW (ref 36.0–46.0)
Hemoglobin: 11.1 g/dL — ABNORMAL LOW (ref 12.0–15.0)
Immature Granulocytes: 1 %
Lymphocytes Relative: 10 %
Lymphs Abs: 0.6 10*3/uL — ABNORMAL LOW (ref 0.7–4.0)
MCH: 29.4 pg (ref 26.0–34.0)
MCHC: 32 g/dL (ref 30.0–36.0)
MCV: 91.8 fL (ref 80.0–100.0)
Monocytes Absolute: 0.5 10*3/uL (ref 0.1–1.0)
Monocytes Relative: 8 %
Neutro Abs: 4.2 10*3/uL (ref 1.7–7.7)
Neutrophils Relative %: 69 %
Platelets: 245 10*3/uL (ref 150–400)
RBC: 3.78 MIL/uL — ABNORMAL LOW (ref 3.87–5.11)
RDW: 14.2 % (ref 11.5–15.5)
WBC: 6 10*3/uL (ref 4.0–10.5)
nRBC: 0 % (ref 0.0–0.2)

## 2019-02-10 LAB — COMPREHENSIVE METABOLIC PANEL
ALT: 9 U/L (ref 0–44)
AST: 25 U/L (ref 15–41)
Albumin: 1.9 g/dL — ABNORMAL LOW (ref 3.5–5.0)
Alkaline Phosphatase: 91 U/L (ref 38–126)
Anion gap: 10 (ref 5–15)
BUN: 18 mg/dL (ref 8–23)
CO2: 21 mmol/L — ABNORMAL LOW (ref 22–32)
Calcium: 10.2 mg/dL (ref 8.9–10.3)
Chloride: 109 mmol/L (ref 98–111)
Creatinine, Ser: 1.03 mg/dL — ABNORMAL HIGH (ref 0.44–1.00)
GFR calc Af Amer: >60 mL/min (ref >60)
GFR calc non Af Amer: 55 mL/min — ABNORMAL LOW (ref >60)
Glucose, Bld: 88 mg/dL (ref 70–99)
Potassium: 3.6 mmol/L (ref 3.5–5.1)
Sodium: 140 mmol/L (ref 135–145)
Total Bilirubin: 0.6 mg/dL (ref 0.3–1.2)
Total Protein: 5 g/dL — ABNORMAL LOW (ref 6.5–8.1)

## 2019-02-10 LAB — MAGNESIUM: Magnesium: 1.5 mg/dL — ABNORMAL LOW (ref 1.7–2.4)

## 2019-02-10 LAB — PROCALCITONIN: Procalcitonin: 0.74 ng/mL

## 2019-02-10 MED ORDER — ZOLEDRONIC ACID 4 MG/5ML IV CONC
4.0000 mg | Freq: Once | INTRAVENOUS | Status: AC
  Administered 2019-02-10: 12:00:00 4 mg via INTRAVENOUS
  Filled 2019-02-10: qty 5

## 2019-02-10 MED ORDER — MIRABEGRON ER 25 MG PO TB24
25.0000 mg | ORAL_TABLET | Freq: Every day | ORAL | Status: DC
  Administered 2019-02-10 – 2019-02-11 (×2): 25 mg via ORAL
  Filled 2019-02-10 (×2): qty 1

## 2019-02-10 MED ORDER — MAGNESIUM SULFATE 2 GM/50ML IV SOLN
2.0000 g | Freq: Once | INTRAVENOUS | Status: AC
  Administered 2019-02-10: 2 g via INTRAVENOUS
  Filled 2019-02-10: qty 50

## 2019-02-10 NOTE — Progress Notes (Signed)
PROGRESS NOTE  Destiny Roth [id number] DOB: [birth year] DOA: 02/03/2019 PCP: No primary care provider on file.   LOS: 6 days   Brief narrative: 69 year old female with past medical history significant for Parkinson's disease, restrictive lung disease, impaired glucose tolerance, IBS, hypertension, hyperlipidemia, diverticulosis and GERD  was admitted to the hospital with progressive weakness. On presentation to the hospital, patient was found to have calcium of 14.9, serum creatinine of 1.8, with associated volume depletion. Patient was on calcium supplement, vitamin D and multivitamin prior to presentation. Patient also reported associated diarrhea. Intact PTH was 5.  Work-up for the possible cause of hypercalcemia is in progress (suspect multifactorial, cannot entirely rule out underlying malignancy). Patient remains very weak, and mildly lethargic.  Subjective Sleeping better But some mild CP some cough Hands still feels some swollen   Assessment/Plan:  Active Problems:   Essential hypertension   Hypercalcemia   Generalized weakness   AKI (acute kidney injury) (Destiny Roth)  Hypercalcemia with mild metabolic encephalopathy.: Likely multifactorial secondary to hypercalcemia, volume depletion and AKI.  As per the patient, she was taking excessive calcium and vitamin D as outpatient for osteoporosis.   Myeloma work-up has been sent but hemoglobin is around 11.2.    Patient was counseled against excessive intake of calcium and vitamin D.  Given hydration, [place] Intact PTH 5 and was low.  24 hydroxy vitamin D was 65.   P TRH pending TSH normal I discussed again with Dr. Lindi Adie on 1/12 and he recommends based on persisting low-grade  CT scans do not show any overt evidence of ca Discussed with Dr. Lindi Adie negative scans and then discussed with Dr. Buddy Duty of endocrinology He recommends getting a 1, 25 hydroxy vitamin D and following pTRH peptide It is unclear if a granulomatous cause or  an occult malignancy may be causing this We will give Zometa 4 mg at his recommendation and I will see over the next 24 hours if patient's results are more indicative of occult cause--- if not patient will be discharged with close follow-up set up with endocrinologist greatly appreciate his input  Pneumonia? Possible found on chest x-ray 2 view 1/11-procalcitonin was also elevated  treated initially with IV antibiotics now on cefdinir complete therapy and 2 more days ending 1/15  Acute kidney injury- DC IVF Lasix 40 daily BUN/creatinine 22/0.9 down from 26/1.0-->21/0.9-->18/1.03  Parkinson's disease: -Continue  sinemet, amantadine.  PT recommending home health  Weakness and lethargy: Likely secondary to volume depletion and hypercalcemia.  Physical therapy consulted.  We will continue as tolerated.  She is a lot more coherent  Essential hypertension: Consider to discontinue HCTZ on discharge due to hypercalcemia.  Will closely monitor blood pressure-no HCTZ on discharge on amlodipine  Mild hypokalemia.  Increase potassium to 40 twice daily given potassiumthree 2.8  Visual hallucination: -This could be organic psychosis. -Patient has hypercalcemia which could have also caused this -Started this admission very low-dose quetiapine.  Patient will be sensitive to antipsychotics.  See above documentation.  VTE Prophylaxis: Lovenox   Code Status: Full code  Family Communication: Husband  At the bedside I called  Disposition Plan: Likely home with home PT. will ask for home health on discharge   Consultants:  As per prior documentation "spoke with Dr. Lindi Adie hematooncology " as above  .  Procedures:  None  Antibiotics:  Anti-infectives (From admission, onward)   Start     Dose/Rate Route Frequency Ordered Stop   02/09/19 1000  levofloxacin (LEVAQUIN) tablet 500 mg  Status:  Discontinued     500 mg Oral Daily 02/09/19 0827 02/09/19 0905   02/09/19 1000  cefdinir  (OMNICEF) capsule 300 mg     300 mg Oral Every 12 hours 02/09/19 0906     02/08/19 1000  cefTRIAXone (ROCEPHIN) 2 g in sodium chloride 0.9 % 100 mL IVPB  Status:  Discontinued     2 g 200 mL/hr over 30 Minutes Intravenous Every 24 hours 02/08/19 0840 02/09/19 0827   02/08/19 1000  azithromycin (ZITHROMAX) 500 mg in sodium chloride 0.9 % 250 mL IVPB  Status:  Discontinued     500 mg 250 mL/hr over 60 Minutes Intravenous Every 24 hours 02/08/19 0840 02/09/19 0827   02/07/19 1100  fluconazole (DIFLUCAN) tablet 150 mg  Status:  Discontinued     150 mg Oral Weekly 02/07/19 1034 02/09/19 1110      Objective: Vitals:   02/10/19 0247 02/10/19 0826  BP: (!) 163/91 (!) 171/86  Pulse: 89 87  Resp:  17  Temp: 97.8 F (36.6 C) 97.8 F (36.6 C)  SpO2: 94% 91%    Intake/Output Summary (Last 24 hours) at 02/10/2019 1041 Last data filed at 02/10/2019 0900 Gross per 24 hour  Intake 720 ml  Output --  Net 720 ml   Filed Weights   02/03/19 1815 02/04/19 0200  Weight: 59 kg 59 kg   Body mass index is 28.45 kg/m.   Physical Exam: Coherent pleasant no distress EOMI NCAT S1-S2 no murmur Chest clinically clear no added sound Abdomen soft nontender no rebound No lower extremity edema Neurologically intact although somewhat weak Still somewhat swollen   Data Review: I have personally reviewed the following laboratory data and studies,  CBC: Recent Labs  Lab 02/05/19 0226 02/06/19 0920 02/07/19 0310 02/09/19 0437 02/10/19 0405  WBC 10.0 8.9 8.6 7.0 6.0  NEUTROABS  --   --  6.6 5.2 4.2  HGB 11.2* 11.2* 11.3* 11.2* 11.1*  HCT 34.1* 35.1* 35.7* 35.5* 34.7*  MCV 91.2 91.6 92.0 91.7 91.8  PLT 285 298 282 265 [id number]   Basic Metabolic Panel: Recent Labs  Lab 02/04/19 0446 02/05/19 0226 02/05/19 0739 02/06/19 0920 02/07/19 0310 02/09/19 0437 02/10/19 0405  NA 138 140  --  141 139 142 140  K 4.6 3.4*  --  3.9 3.5 2.8* 3.6  CL 104 109  --  112* 112* 111 109  CO2 25 19*  --  19*  19* 21* 21*  GLUCOSE 80 97  --  85 106* 107* 88  BUN 47* 26*  --  26* 22 21 18   CREATININE 1.67* 1.00  --  1.00 0.93 0.96 1.03*  CALCIUM 12.4* 10.1 9.7 10.0 10.2 10.2 10.2  MG  --  1.4*  --  1.6* 1.6*  --  1.5*  PHOS 2.8 2.0*  --  2.3* 2.8 4.0  --    Liver Function Tests: Recent Labs  Lab 02/05/19 0226 02/06/19 0920 02/07/19 0310 02/09/19 0437 02/10/19 0405  AST  --  71*  --   --  25  ALT  --  12  --   --  9  ALKPHOS  --  100  --   --  91  BILITOT  --  0.4  --   --  0.6  PROT  --  4.9*  --   --  5.0*  ALBUMIN 2.1* 2.0* 1.9* 1.8* 1.9*   No results for input(s): LIPASE, AMYLASE in the last 168 hours. No results for  input(s): AMMONIA in the last 168 hours. Cardiac Enzymes: No results for input(s): CKTOTAL, CKMB, CKMBINDEX, TROPONINI in the last 168 hours. BNP (last 3 results) No results for input(s): BNP in the last 8760 hours.  ProBNP (last 3 results) No results for input(s): PROBNP in the last 8760 hours.  CBG: Recent Labs  Lab 02/03/19 1340 02/04/19 0902  GLUCAP 122* 93   Recent Results (from the past 240 hour(s))  SARS CORONAVIRUS 2 (TAT 6-24 HRS) Nasopharyngeal Nasopharyngeal Swab     Status: None   Collection Time: 02/03/19  2:08 PM   Specimen: Nasopharyngeal Swab  Result Value Ref Range Status   SARS Coronavirus 2 NEGATIVE NEGATIVE Final    Comment: (NOTE) SARS-CoV-2 target nucleic acids are NOT DETECTED. The SARS-CoV-2 RNA is generally detectable in upper and lower respiratory specimens during the acute phase of infection. Negative results do not preclude SARS-CoV-2 infection, do not rule out co-infections with other pathogens, and should not be used as the sole basis for treatment or other patient management decisions. Negative results must be combined with clinical observations, patient history, and epidemiological information. The expected result is Negative. Fact Sheet for Patients: SugarRoll.be Fact Sheet for Healthcare  Providers: https://www.woods-mathews.com/ This test is not yet approved or cleared by the [place] FDA and  has been authorized for detection and/or diagnosis of SARS-CoV-2 by FDA under an Emergency Use Authorization (EUA). This EUA will remain  in effect (meaning this test can be used) for the duration of the COVID-19 declaration under Section 56 4(b)(1) of the Act, 21 U.S.C. section 360bbb-3(b)(1), unless the authorization is terminated or revoked sooner. Performed at [facility], [place] [address], [place], [place] [address]   Aerobic/Anaerobic Culture (surgical/deep wound)     Status: None (Preliminary result)   Collection Time: 02/07/19 11:03 AM   Specimen: Vaginal  Result Value Ref Range Status   Specimen Description VAGINA  Final   Special Requests LOOKING FOR YEAST  Final   Gram Stain   Final    NO WBC SEEN ABUNDANT GRAM POSITIVE RODS MODERATE GRAM POSITIVE COCCI IN PAIRS IN CLUSTERS FEW GRAM NEGATIVE RODS    Culture   Final    FEW ESCHERICHIA COLI HOLDING FOR POSSIBLE ANAEROBE Performed at [facility], [place] [address], [place], [place] [address]    Report Status PENDING  Incomplete   Organism ID, Bacteria ESCHERICHIA COLI  Final      Susceptibility   Escherichia coli - MIC*    AMPICILLIN 4 SENSITIVE Sensitive     CEFAZOLIN <=4 SENSITIVE Sensitive     CEFEPIME <=0.12 SENSITIVE Sensitive     CEFTAZIDIME <=1 SENSITIVE Sensitive     CEFTRIAXONE <=0.25 SENSITIVE Sensitive     CIPROFLOXACIN <=0.25 SENSITIVE Sensitive     GENTAMICIN <=1 SENSITIVE Sensitive     IMIPENEM <=0.25 SENSITIVE Sensitive     TRIMETH/SULFA <=20 SENSITIVE Sensitive     AMPICILLIN/SULBACTAM <=2 SENSITIVE Sensitive     PIP/TAZO <=4 SENSITIVE Sensitive     * FEW ESCHERICHIA COLI     Studies: CT CHEST W CONTRAST  Result Date: 02/09/2019 CLINICAL DATA:  68 year old female with history of progressive weakness. Hypercalcemia. Evaluate for potential malignancy such as  lymphoma. EXAM: CT CHEST, ABDOMEN, AND PELVIS WITH CONTRAST TECHNIQUE: Multidetector CT imaging of the chest, abdomen and pelvis was performed following the standard protocol during bolus administration of intravenous contrast. CONTRAST:  129mL OMNIPAQUE IOHEXOL 300 MG/ML  SOLN COMPARISON:  Chest CT 01/07/2014. CT the  abdomen and pelvis 11/24/2012. FINDINGS: CT CHEST FINDINGS Cardiovascular: Heart size is normal. There is no significant pericardial fluid, thickening or pericardial calcification. There is aortic atherosclerosis, as well as atherosclerosis of the great vessels of the mediastinum and the coronary arteries, including calcified atherosclerotic plaque in the left main, left anterior descending and right coronary arteries. Mediastinum/Nodes: No pathologically enlarged mediastinal or hilar lymph nodes. Esophagus is unremarkable in appearance. No axillary lymphadenopathy. Lungs/Pleura: Moderate bilateral pleural effusions lying dependently with areas of passive atelectasis in the lower lobes of the lungs bilaterally. Patchy areas of very mild ground-glass attenuation are noted in the lungs bilaterally, nonspecific. No definite consolidative airspace disease. No suspicious appearing pulmonary nodules or masses are noted. Musculoskeletal: There are no aggressive appearing lytic or blastic lesions noted in the visualized portions of the skeleton. CT ABDOMEN PELVIS FINDINGS Hepatobiliary: No suspicious cystic or solid hepatic lesions. No intra or extrahepatic biliary ductal dilatation. Diffuse gallbladder wall edema. No calcified gallstones. No overt pericholecystic inflammatory changes. Pancreas: No pancreatic mass. No pancreatic ductal dilatation. No pancreatic or peripancreatic fluid collections or inflammatory changes. Spleen: Unremarkable. Adrenals/Urinary Tract: Spleen measures up to 12.9 x 4.6 x 11.5 cm (estimated splenic volume of 341 mL), which is upper limits of normal, and is unremarkable in  appearance. Stomach/Bowel: Normal appearance of the stomach. No pathologic dilatation of small bowel or colon. A few scattered colonic diverticulae are noted, particularly in the sigmoid colon, without surrounding inflammatory changes to suggest an acute diverticulitis at this time. Normal appendix. Vascular/Lymphatic: Aortic atherosclerosis, without evidence of aneurysm or dissection in the abdominal or pelvic vasculature. No lymphadenopathy noted in the abdomen or pelvis. Reproductive: Uterus and ovaries are unremarkable in appearance. Other: Small volume of ascites, most evident adjacent to the liver. No pneumoperitoneum. Musculoskeletal: There are no aggressive appearing lytic or blastic lesions noted in the visualized portions of the skeleton. IMPRESSION: 1. No definite evidence of lymphoma or other malignancy. 2. Moderate bilateral pleural effusions lying dependently with extensive passive atelectasis in the lower lobes of the lungs bilaterally. 3. Trace volume of ascites. 4. Gallbladder wall edema, nonspecific in the setting of small volume of ascites. No definite gallstones. Gallbladder does not appear distended, nor are there are surrounding pericholecystic inflammatory changes to suggest an acute cholecystitis at this time. 5. Aortic atherosclerosis, in addition to left main and 2 vessel coronary artery disease. Assessment for potential risk factor modification, dietary therapy or pharmacologic therapy may be warranted, if clinically indicated. 6. Colonic diverticulosis without evidence of acute diverticulitis at this time. Electronically Signed   By: Vinnie Langton M.D.   On: 02/09/2019 17:50   CT ABDOMEN PELVIS W CONTRAST  Result Date: 02/09/2019 CLINICAL DATA:  69 year old female with history of progressive weakness. Hypercalcemia. Evaluate for potential malignancy such as lymphoma. EXAM: CT CHEST, ABDOMEN, AND PELVIS WITH CONTRAST TECHNIQUE: Multidetector CT imaging of the chest, abdomen and pelvis  was performed following the standard protocol during bolus administration of intravenous contrast. CONTRAST:  162mL OMNIPAQUE IOHEXOL 300 MG/ML  SOLN COMPARISON:  Chest CT 01/07/2014. CT the abdomen and pelvis 11/24/2012. FINDINGS: CT CHEST FINDINGS Cardiovascular: Heart size is normal. There is no significant pericardial fluid, thickening or pericardial calcification. There is aortic atherosclerosis, as well as atherosclerosis of the great vessels of the mediastinum and the coronary arteries, including calcified atherosclerotic plaque in the left main, left anterior descending and right coronary arteries. Mediastinum/Nodes: No pathologically enlarged mediastinal or hilar lymph nodes. Esophagus is unremarkable in appearance. No axillary lymphadenopathy. Lungs/Pleura:  Moderate bilateral pleural effusions lying dependently with areas of passive atelectasis in the lower lobes of the lungs bilaterally. Patchy areas of very mild ground-glass attenuation are noted in the lungs bilaterally, nonspecific. No definite consolidative airspace disease. No suspicious appearing pulmonary nodules or masses are noted. Musculoskeletal: There are no aggressive appearing lytic or blastic lesions noted in the visualized portions of the skeleton. CT ABDOMEN PELVIS FINDINGS Hepatobiliary: No suspicious cystic or solid hepatic lesions. No intra or extrahepatic biliary ductal dilatation. Diffuse gallbladder wall edema. No calcified gallstones. No overt pericholecystic inflammatory changes. Pancreas: No pancreatic mass. No pancreatic ductal dilatation. No pancreatic or peripancreatic fluid collections or inflammatory changes. Spleen: Unremarkable. Adrenals/Urinary Tract: Spleen measures up to 12.9 x 4.6 x 11.5 cm (estimated splenic volume of 341 mL), which is upper limits of normal, and is unremarkable in appearance. Stomach/Bowel: Normal appearance of the stomach. No pathologic dilatation of small bowel or colon. A few scattered colonic  diverticulae are noted, particularly in the sigmoid colon, without surrounding inflammatory changes to suggest an acute diverticulitis at this time. Normal appendix. Vascular/Lymphatic: Aortic atherosclerosis, without evidence of aneurysm or dissection in the abdominal or pelvic vasculature. No lymphadenopathy noted in the abdomen or pelvis. Reproductive: Uterus and ovaries are unremarkable in appearance. Other: Small volume of ascites, most evident adjacent to the liver. No pneumoperitoneum. Musculoskeletal: There are no aggressive appearing lytic or blastic lesions noted in the visualized portions of the skeleton. IMPRESSION: 1. No definite evidence of lymphoma or other malignancy. 2. Moderate bilateral pleural effusions lying dependently with extensive passive atelectasis in the lower lobes of the lungs bilaterally. 3. Trace volume of ascites. 4. Gallbladder wall edema, nonspecific in the setting of small volume of ascites. No definite gallstones. Gallbladder does not appear distended, nor are there are surrounding pericholecystic inflammatory changes to suggest an acute cholecystitis at this time. 5. Aortic atherosclerosis, in addition to left main and 2 vessel coronary artery disease. Assessment for potential risk factor modification, dietary therapy or pharmacologic therapy may be warranted, if clinically indicated. 6. Colonic diverticulosis without evidence of acute diverticulitis at this time. Electronically Signed   By: Vinnie Langton M.D.   On: 02/09/2019 17:50    Scheduled Meds: . amLODipine  10 mg Oral Daily  . aspirin  81 mg Oral Daily  . carbidopa-levodopa  1 tablet Oral 5 times per day  . Carbidopa-Levodopa ER  1 tablet Oral 3 times per day  . Carbidopa-Levodopa ER  2 tablet Oral 2 times per day  . cefdinir  300 mg Oral Q12H  . furosemide  40 mg Oral Daily  . potassium chloride  40 mEq Oral BID  . rOPINIRole  1 mg Oral TID    Nita Sells, MD  [facility] 02/10/2019

## 2019-02-10 NOTE — Progress Notes (Signed)
Physical Therapy Treatment Patient Details Name: Destiny Roth MRN: [id number] DOB: 09/15/1950 Today's Date: 02/10/2019    History of Present Illness 69 year old female admitted with progressive weakness with hypercalcemia. PMHx: Parkinson's disease, restrictive lung disease, impaired glucose tolerance, IBS, HTN, HLD, diverticulosis and GERD.    PT Comments    On arrival to room pt reports she "had an accident" in bed and was assisted with peri care. Pt transferred to [facility] prior to ambulation in room. Limited ambulation to 3 laps in room as pt has been incontinent of both bowls and bladder. RN present at end of session and notified of need for pt to have a youth walker. The one delivered to room was a standard. RN also advised pt will not d/c today. Plan to practice steps next session.   Follow Up Recommendations  Home health PT;Supervision/Assistance - 24 hour     Equipment Recommendations  Rolling walker with 5" wheels;3in1 (PT)(youth)    Recommendations for Other Services       Precautions / Restrictions Precautions Precautions: Fall Restrictions Weight Bearing Restrictions: No    Mobility  Bed Mobility Overal bed mobility: Needs Assistance Bed Mobility: Supine to Sit;Rolling Rolling: Supervision   Supine to sit: Min assist;HOB elevated     General bed mobility comments: min A with HOB elevated. Pt able to progress LE off EOB, assist provided for trunk elevation. Pt able to roll, for peri care, with use of bedrails  Transfers Overall transfer level: Needs assistance Equipment used: Rolling walker (2 wheeled) Transfers: Sit to/from Stand Sit to Stand: Min guard Stand pivot transfers: Min guard       General transfer comment: Cues for hand placement with RW. min guard for safety  Ambulation/Gait Ambulation/Gait assistance: Min guard Gait Distance (Feet): 120 Feet Assistive device: Rolling walker (2 wheeled) Gait Pattern/deviations: Step-through  pattern;Decreased stride length;Trunk flexed Gait velocity: decreased   General Gait Details: Pt declined mobility into hallway secondary to fear of incontinence. She ambulated 3 laps in the room ~120 ft, with min guard for safety and assist for line management.   Stairs             Wheelchair Mobility    Modified Rankin (Stroke Patients Only)       Balance Overall balance assessment: Needs assistance Sitting-balance support: Bilateral upper extremity supported Sitting balance-Leahy Scale: Good Sitting balance - Comments: no UE support EOB and at toilet   Standing balance support: Bilateral upper extremity supported;During functional activity Standing balance-Leahy Scale: Poor Standing balance comment: UE support for gait                            Cognition Arousal/Alertness: Awake/alert Behavior During Therapy: WFL for tasks assessed/performed Overall Cognitive Status: Impaired/Different from baseline Area of Impairment: Problem solving                               General Comments: pt cognition has been improving. She was aware enough to notify PTA that she had a BM in bed. She is unable to control her bowl or bladder at this time and was fearful of ambulating in hallway due to lack of control.      Exercises      General Comments General comments (skin integrity, edema, etc.): assisted with peri care in bed and on BSC. Pt reports she is unable to reach back for peri care.  Pertinent Vitals/Pain Pain Assessment: No/denies pain Pain Intervention(s): Monitored during session;Limited activity within patient's tolerance;Repositioned    Home Living                      Prior Function            PT Goals (current goals can now be found in the care plan section) Acute Rehab PT Goals Patient Stated Goal: return home PT Goal Formulation: With patient Time For Goal Achievement: 02/19/19 Potential to Achieve Goals:  Fair Progress towards PT goals: Progressing toward goals    Frequency    Min 3X/week      PT Plan Current plan remains appropriate    Co-evaluation              AM-PAC PT "6 Clicks" Mobility   Outcome Measure  Help needed turning from your back to your side while in a flat bed without using bedrails?: None Help needed moving from lying on your back to sitting on the side of a flat bed without using bedrails?: A Little Help needed moving to and from a bed to a chair (including a wheelchair)?: A Little Help needed standing up from a chair using your arms (e.g., wheelchair or bedside chair)?: A Little Help needed to walk in hospital room?: A Little Help needed climbing 3-5 steps with a railing? : A Little 6 Click Score: 19    End of Session Equipment Utilized During Treatment: Gait belt Activity Tolerance: Patient tolerated treatment well Patient left: in chair;with call bell/phone within reach Nurse Communication: Mobility status;Other (comment)(RW delivered to room needs to be youth sized.) PT Visit Diagnosis: Other abnormalities of gait and mobility (R26.89);Muscle weakness (generalized) (M62.81)     Time: [id number] PT Time Calculation (min) (ACUTE ONLY): 37 min  Charges:  $Gait Training: 8-22 mins $Therapeutic Activity: 8-22 mins                     Benjiman Core, [place] Pager [phone] Acute Rehab  Allena Katz 02/10/2019, 12:27 PM

## 2019-02-10 NOTE — Plan of Care (Signed)

## 2019-02-11 DIAGNOSIS — R531 Weakness: Secondary | ICD-10-CM

## 2019-02-11 LAB — RENAL FUNCTION PANEL
Albumin: 2 g/dL — ABNORMAL LOW (ref 3.5–5.0)
Anion gap: 6 (ref 5–15)
BUN: 16 mg/dL (ref 8–23)
CO2: 26 mmol/L (ref 22–32)
Calcium: 9.6 mg/dL (ref 8.9–10.3)
Chloride: 108 mmol/L (ref 98–111)
Creatinine, Ser: 0.98 mg/dL (ref 0.44–1.00)
GFR calc Af Amer: >60 mL/min (ref >60)
GFR calc non Af Amer: 58 mL/min — ABNORMAL LOW (ref >60)
Glucose, Bld: 111 mg/dL — ABNORMAL HIGH (ref 70–99)
Phosphorus: 3.4 mg/dL (ref 2.5–4.6)
Potassium: 3.7 mmol/L (ref 3.5–5.1)
Sodium: 140 mmol/L (ref 135–145)

## 2019-02-11 LAB — AEROBIC/ANAEROBIC CULTURE W GRAM STAIN (SURGICAL/DEEP WOUND): Gram Stain: NONE SEEN

## 2019-02-11 LAB — CALCITRIOL (1,25 DI-OH VIT D): Vit D, 1,25-Dihydroxy: 72.6 pg/mL (ref 19.9–79.3)

## 2019-02-11 LAB — PTH-RELATED PEPTIDE: PTH-related peptide: <2 pmol/L

## 2019-02-11 MED ORDER — AMLODIPINE BESYLATE 10 MG PO TABS: 10.0000 mg | ORAL_TABLET | Freq: Every day | ORAL | 1 refills | Status: DC

## 2019-02-11 MED ORDER — ROPINIROLE HCL 1 MG PO TABS: 1.0000 mg | ORAL_TABLET | Freq: Three times a day (TID) | ORAL | 0 refills | Status: AC

## 2019-02-11 MED ORDER — FUROSEMIDE 20 MG PO TABS: 20.0000 mg | ORAL_TABLET | Freq: Two times a day (BID) | ORAL | 0 refills | Status: DC

## 2019-02-11 MED ORDER — AMANTADINE HCL 100 MG PO TABS: 100.0000 mg | ORAL_TABLET | Freq: Every day | ORAL | Status: DC

## 2019-02-11 MED ORDER — MIRABEGRON ER 25 MG PO TB24: 25.0000 mg | ORAL_TABLET | Freq: Every day | ORAL | 1 refills | Status: DC

## 2019-02-11 NOTE — Progress Notes (Signed)
Physical Therapy Treatment Patient Details Name: Destiny Roth MRN: [id number] DOB: 1950/10/01 Today's Date: 02/11/2019    History of Present Illness 69 year old female admitted with progressive weakness with hypercalcemia. PMHx: Parkinson's disease, restrictive lung disease, impaired glucose tolerance, IBS, HTN, HLD, diverticulosis and GERD.    PT Comments    Pt is progressing well towards goals. She ambulated in hallway and negotiated steps with min guard for safety. Pt is unable to reach back for peri care and requires total assist to wipe. Pt assisted with donning clothing prior to ambulation. She demonstrated fair balance as she was able to bend over to pull up pants w/o external support. Pt is expected to d/t today with HHPT to follow up.    Follow Up Recommendations  Home health PT;Supervision/Assistance - 24 hour     Equipment Recommendations  Rolling walker with 5" wheels;3in1 (PT)(youth)    Recommendations for Other Services       Precautions / Restrictions Precautions Precautions: Fall Restrictions Weight Bearing Restrictions: No    Mobility  Bed Mobility Overal bed mobility: Needs Assistance Bed Mobility: Sit to Supine     Supine to sit: Min guard;HOB elevated Sit to supine: Min assist   General bed mobility comments: min A to progress LEs into bed  Transfers Overall transfer level: Needs assistance Equipment used: Rolling walker (2 wheeled) Transfers: Sit to/from Stand Sit to Stand: Min guard Stand pivot transfers: Min guard       General transfer comment: Cues for hand placement with RW. min guard for safety  Ambulation/Gait Ambulation/Gait assistance: Min guard Gait Distance (Feet): 150 Feet Assistive device: Rolling walker (2 wheeled) Gait Pattern/deviations: Step-through pattern;Decreased stride length Gait velocity: decreased   General Gait Details: Pt showing improved stability and posture with RW. Cues for RW  proximity   Stairs Stairs: Yes Stairs assistance: Min guard Stair Management: One rail Left Number of Stairs: 2 General stair comments: min guard for safety. Cues for technique   Wheelchair Mobility    Modified Rankin (Stroke Patients Only)       Balance Overall balance assessment: Needs assistance Sitting-balance support: Bilateral upper extremity supported Sitting balance-Leahy Scale: Good Sitting balance - Comments: no UE support EOB and at toilet   Standing balance support: Bilateral upper extremity supported;During functional activity Standing balance-Leahy Scale: Fair Standing balance comment: able to stand statically to pull up underwear and pants                            Cognition Arousal/Alertness: Awake/alert Behavior During Therapy: WFL for tasks assessed/performed Overall Cognitive Status: Within Functional Limits for tasks assessed Area of Impairment: Problem solving                               General Comments: cognition improving      Exercises      General Comments General comments (skin integrity, edema, etc.): assisted with dressing and peri care prior to ambulation      Pertinent Vitals/Pain Pain Assessment: No/denies pain Faces Pain Scale: No hurt Pain Intervention(s): Monitored during session    Home Living                      Prior Function            PT Goals (current goals can now be found in the care plan section) Acute Rehab PT Goals  Patient Stated Goal: return home PT Goal Formulation: With patient Time For Goal Achievement: 02/19/19 Potential to Achieve Goals: Fair Progress towards PT goals: Progressing toward goals    Frequency    Min 3X/week      PT Plan Current plan remains appropriate    Co-evaluation              AM-PAC PT "6 Clicks" Mobility   Outcome Measure  Help needed turning from your back to your side while in a flat bed without using bedrails?:  None Help needed moving from lying on your back to sitting on the side of a flat bed without using bedrails?: A Little Help needed moving to and from a bed to a chair (including a wheelchair)?: A Little Help needed standing up from a chair using your arms (e.g., wheelchair or bedside chair)?: A Little Help needed to walk in hospital room?: A Little Help needed climbing 3-5 steps with a railing? : A Little 6 Click Score: 19    End of Session Equipment Utilized During Treatment: Gait belt Activity Tolerance: Patient tolerated treatment well Patient left: with call bell/phone within reach;in bed Nurse Communication: Mobility status PT Visit Diagnosis: Other abnormalities of gait and mobility (R26.89);Muscle weakness (generalized) (M62.81)     Time: [id number] PT Time Calculation (min) (ACUTE ONLY): 42 min  Charges:  $Gait Training: 23-37 mins $Therapeutic Activity: 8-22 mins                     Benjiman Core, [place] Pager [phone] Acute Rehab   Allena Katz 02/11/2019, 2:57 PM

## 2019-02-11 NOTE — TOC Transition Note (Signed)
Transition of Care [facility]) - CM/SW Discharge Note   Patient Details  Name: Destiny Roth MRN: [id number] Date of Birth: [birth year]  [facility] CM/SW Contact:  Sharin Mons, RN Phone Number: [phone] 02/11/2019, 11:53 AM   Clinical Narrative:    Pt will transition to home today with home health services to follow, [facility]. DME: youth walker already provided to pt for home. Walker @ bedside. Pt without Rx med concerns. NCM was able to scheduled hospital f/u with Dr. Buddy Duty, refer to AVS.  Husband to provide transportation to home.   Final next level of care: [place] Barriers to Discharge: No Barriers Identified   Patient Goals and CMS Choice     Choice offered to / list presented to : Patient, Spouse  Discharge Placement                       Discharge Plan and Services   Discharge Planning Services: CM Consult            DME Arranged: 3-N-1, Walker rolling DME Agency: [employer] Date DME Agency Contacted: 02/09/19 Time DME Agency Contacted: [phone]   [place] Arranged: PT, OT, RN, Nurse's Aide [place] Agency: [place] ([place]) Date [place]: 02/09/19 Time [place]: 1716 Representative spoke with at [place]: [place] ([place]) Interventions     Readmission Risk Interventions No flowsheet data found.

## 2019-02-11 NOTE — Plan of Care (Signed)
  Problem: Safety: Goal: Ability to remain free from injury will improve Outcome: Progressing   

## 2019-02-11 NOTE — Progress Notes (Signed)
Occupational Therapy Treatment Patient Details Name: Destiny Roth MRN: [id number] DOB: 1950/05/21 Today's Date: 02/11/2019    History of present illness 69 year old female admitted with progressive weakness with hypercalcemia. PMHx: Parkinson's disease, restrictive lung disease, impaired glucose tolerance, IBS, HTN, HLD, diverticulosis and GERD.   OT comments  Pt making good progress with functional goals. Pt demos improved cognition, but still needs cues for safety using RW for ADL mobility.   Follow Up Recommendations  Home health OT;Supervision/Assistance - 24 hour    Equipment Recommendations  3 in 1 bedside commode    Recommendations for Other Services      Precautions / Restrictions Precautions Precautions: Fall Restrictions Weight Bearing Restrictions: No       Mobility Bed Mobility Overal bed mobility: Needs Assistance Bed Mobility: Supine to Sit     Supine to sit: Min guard;HOB elevated        Transfers Overall transfer level: Needs assistance Equipment used: Rolling walker (2 wheeled) Transfers: Sit to/from Stand Sit to Stand: Min guard Stand pivot transfers: Min guard       General transfer comment: Cues for hand placement with RW. min guard for safety    Balance Overall balance assessment: Needs assistance Sitting-balance support: Bilateral upper extremity supported Sitting balance-Leahy Scale: Good     Standing balance support: Bilateral upper extremity supported;During functional activity Standing balance-Leahy Scale: Poor                             ADL either performed or assessed with clinical judgement   ADL Overall ADL's : Needs assistance/impaired Eating/Feeding: Set up;Sitting   Grooming: Wash/dry face;Wash/dry hands;Min guard;Standing       Lower Body Bathing: Minimal assistance;Sit to/from stand;With caregiver independent assisting       Lower Body Dressing: Minimal assistance;Sit to/from stand;With  caregiver independent assisting   Toilet Transfer: Min guard;Regular Toilet;Grab bars;Cueing for sequencing;Ambulation;RW;Cueing for safety   Toileting- Clothing Manipulation and Hygiene: Min guard;Sit to/from stand       Functional mobility during ADLs: Cueing for safety;Cueing for sequencing;Rolling walker;Min guard       Vision Patient Visual Report: No change from baseline     Perception     Praxis      Cognition Arousal/Alertness: Awake/alert Behavior During Therapy: WFL for tasks assessed/performed Overall Cognitive Status: Impaired/Different from baseline Area of Impairment: Problem solving                               General Comments: cognition improving        Exercises     Shoulder Instructions       General Comments      Pertinent Vitals/ Pain       Pain Assessment: No/denies pain Faces Pain Scale: No hurt Pain Intervention(s): Monitored during session  Home Living                                          Prior Functioning/Environment              Frequency  Min 2X/week        Progress Toward Goals  OT Goals(current goals can now be found in the care plan section)  Progress towards OT goals: Progressing toward goals  Acute Rehab OT Goals Patient Stated Goal: return  home  Plan Discharge plan remains appropriate    Co-evaluation                 AM-PAC OT "6 Clicks" Daily Activity     Outcome Measure   Help from another person eating meals?: None Help from another person taking care of personal grooming?: A Little Help from another person toileting, which includes using toliet, bedpan, or urinal?: A Little Help from another person bathing (including washing, rinsing, drying)?: A Little Help from another person to put on and taking off regular upper body clothing?: None Help from another person to put on and taking off regular lower body clothing?: A Little 6 Click Score: 20    End of  Session Equipment Utilized During Treatment: Gait belt;Rolling walker  OT Visit Diagnosis: Muscle weakness (generalized) (M62.81);Other symptoms and signs involving cognitive function   Activity Tolerance Patient tolerated treatment well   Patient Left with bed alarm set;in chair;with chair alarm set   Nurse Communication          Time: [id number] OT Time Calculation (min): 35 min  Charges: OT General Charges $OT Visit: 1 Visit OT Treatments $Self Care/Home Management : 8-22 mins $Therapeutic Activity: 8-22 mins     Britt Bottom 02/11/2019, 12:57 PM

## 2019-02-11 NOTE — Discharge Summary (Signed)
Physician Discharge Summary  Destiny Roth KM:[id number] DOB: May 22, 1950 DOA: 02/03/2019  PCP: No primary care provider on file.  Admit date: 02/03/2019 Discharge date: 02/11/2019  Time spent: 45 minutes  Recommendations for Outpatient Follow-up:  1. Patient will need outpatient coordination with Dr. Delrae Rend and Dr. Nicholas Lose to help with further work-up of hypercalcemia-please follow-up as an outpatient P TRH as well as 125 dihydroxy vitamin D 2. Requires Chem-12, CBC 1 week 3. Requires outpatient neurology input given changes to medications based on how patient was taking some of her Parkinson's meds 4. Requires PCP appointment in about 1 week 5. Requesting PT OT to follow-up in the outpatient setting  Discharge Diagnoses:  Active Problems:   Essential hypertension   Hypercalcemia   Generalized weakness   AKI (acute kidney injury) [facility])   Discharge Condition: Improved  Diet recommendation: Heart healthy  Filed Weights   02/03/19 1815 02/04/19 0200  Weight: 59 kg 59 kg    History of present illness:  69 year old FEM with past medical history significant for Parkinson's disease, restrictive lung disease, impaired glucose tolerance, IBS, hypertension, hyperlipidemia, diverticulosis and GERD  was admitted to the hospital with progressive weakness. On presentation to the hospital, patient was found to have calcium of 14.9, serum creatinine of 1.8, with associated volume depletion.   on calcium supplement, vitamin D and multivitamin prior to presentation. Patient also reported associated diarrhea. Intact PTH was 5.  Work-up for performed for hypercalcemia Oncology endocrinology consulted Labs pending in the outpatient setting will need follow-up and coordination  Hospital Course:  Hypercalcemia with mild metabolic encephalopathy.: Likely multifactorial secondary to hypercalcemia, volume depletion and AKI.  Myeloma work-up has been sent but hemoglobin is around 11.2.     Patient reported taking a lot of vitamin D and calcium but later family reported that that was not the case as pill bottles were filled and it had not been taken Intact PTH 5 and was low.  24 hydroxy vitamin D was 65.   P TRH pending TSH normal I discussed again with Dr. Lindi Adie on 1/12 and he recommends based on persisting low-grade  CT scans do not show any overt evidence of ca Discussed with Dr. Lindi Adie negative scans and then discussed with Dr. Buddy Duty of endocrinology He recommends getting a 1, 25 hydroxy vitamin D and following pTRH peptide It is unclear if a granulomatous cause or an occult malignancy may be causing this Patient given on 1/13 Zometa 4 mg at his recommendation  Labs will not be back for several more days with an estimated lag time of PTR H peptide 10 days and 4 days for vitamin 125 vitamin D so I have attempted to coordinate care with the consultants listed above in the outpatient setting  Pneumonia? Possible found on chest x-ray 2 view 1/11-procalcitonin was also elevated  treated initially with IV antibiotics now on cefdinir complete therapy and 2 more days ending 1/14 I circumscribed the course of antibiotics and patient was stable from my perspective on discharge  Acute kidney injury- DC IVF Lasix 40 daily BUN/creatinine 22/0.9 down from 26/1.0-->21/0.9-->18/1.03  Parkinson'sdisease: -Continue sinemet, amantadine dose was adjusted as patient was not taking as reported.  PT recommending home health  Weakness and lethargy: Likely secondary to volume depletion and hypercalcemia.  Physical therapy consulted.  We will continue as tolerated.  She is a lot more coherent  Essential hypertension: We did stop HCTZ on discharge due to hypercalcemia.  Will closely monitor blood pressure-no HCTZ on  discharge on amlodipine  Mild hypokalemia.  Increase potassium to 40 twice daily given potassiumthree 2.8  Visual hallucination: -T possibly secondary to withdrawal of  gabapentin from it being stopped on admission -Patient has hypercalcemia which could have also caused this   Procedures: Multiple scans including CT head abdomen pelvis which only showed mild splenomegaly  Consultations:  Endocrinology Dr. Dagmar Hait  Oncology Dr. Lindi Adie  Discharge Exam: Vitals:   02/11/19 0402 02/11/19 0753  BP: (!) 171/90 (!) 167/79  Pulse: 89 96  Resp: 16 18  Temp: 98.9 F (37.2 C) 98.2 F (36.8 C)  SpO2: 94% 93%    General: EOMI NCAT no focal deficit in no distress feels better daily Cardiovascular: S1-S2 no murmur rub or gallop Respiratory: Clinically clear no added sound Still has upper and lower extremity edema but seems to be improved Abdomen soft nontender Neurologically intact moving all 4 limbs without focal deficit  Discharge Instructions   Discharge Instructions    Diet - low sodium heart healthy   Complete by: As directed    Diet - low sodium heart healthy   Complete by: As directed    Discharge instructions   Complete by: As directed    Take lasix for 2 doses and then stop You will notice multiple medications have changes with regards to some of your chronic pain meds like Gabapentin, bentyl NO vitamins, calcium or other OTC meds at this time Please follow up with Dr. Buddy Duty in the OP setting--His office will be accepting your call and I am hopeful that you can be scheduled within the next 1-2 weeks He will be access the labs that we discussed.   Discharge instructions   Complete by: As directed    Please look at your medication list carefully compared with your medications at home and ensure that your primary care physicians know that you were admitted and that some medications including some your Parkinson's and other meds for pain management have been changed because of some mild confusion you had while in the hospital I will copy Dr. Delrae Rend and Dr. Nicholas Lose (endocrinology and oncology respectively) on this note and I will ask  that Dr. Deliah Goody office follow in the outpatient setting you as an outpatient so that care coordination can be discussed given that your primary care physician is in the [facility] and [facility] will need to have Chem-12 CBC in 1 week and further testing will be dependent on you getting 2 lab tests back, 125 dihydroxy vitamin D in addition to PT Rh peptide which is still pending Home health will be requested so that you can get some assistance at home   Increase activity slowly   Complete by: As directed    Increase activity slowly   Complete by: As directed      Allergies as of 02/11/2019      Reactions   Lipitor [atorvastatin] Other (See Comments)   myalgia   Zocor [simvastatin] Other (See Comments)   myalgias      Medication List    STOP taking these medications   CALCIUM 1200 PO   cholecalciferol 1000 units tablet Commonly known as: VITAMIN D   dicyclomine 20 MG tablet Commonly known as: Bentyl   gabapentin 100 MG capsule Commonly known as: NEURONTIN   Klor-Con M20 20 MEQ tablet Generic drug: potassium chloride SA   ONE-A-DAY WOMENS PO     TAKE these medications   Amantadine HCl 100 MG tablet Take 1 tablet (  100 mg total) by mouth daily. What changed: when to take this   amLODipine 10 MG tablet Commonly known as: NORVASC Take 1 tablet (10 mg total) by mouth daily. Start taking on: February 12, 2019   aspirin 81 MG tablet Take 81 mg by mouth daily.   Carbidopa-Levodopa ER 25-100 MG tablet controlled release Commonly known as: SINEMET CR Take 1 tablet by mouth at bedtime. What changed:   how much to take  when to take this  additional instructions   carbidopa-levodopa 25-100 MG tablet Commonly known as: SINEMET IR TAKE ONE TABLET BY MOUTH THREE TIMES DAILY What changed: when to take this   furosemide 20 MG tablet Commonly known as: Lasix Take 1 tablet (20 mg total) by mouth 2 (two) times daily for 2 doses.   hydrocortisone 25 MG  suppository Commonly known as: Anusol-HC Place 1 suppository (25 mg total) rectally at bedtime as needed for hemorrhoids or itching.   mirabegron ER 25 MG Tb24 tablet Commonly known as: MYRBETRIQ Take 1 tablet (25 mg total) by mouth daily. Start taking on: February 12, 2019   rOPINIRole 1 MG tablet Commonly known as: REQUIP Take 1 tablet (1 mg total) by mouth 3 (three) times daily. What changed:   medication strength  See the new instructions.  Another medication with the same name was removed. Continue taking this medication, and follow the directions you see here.   rosuvastatin 5 MG tablet Commonly known as: CRESTOR Take 5 mg by mouth daily.   sertraline 100 MG tablet Commonly known as: ZOLOFT Take 100 mg by mouth daily.   triamterene-hydrochlorothiazide 37.5-25 MG capsule Commonly known as: DYAZIDE Take 1 capsule by mouth daily.            Durable Medical Equipment  (From admission, onward)         Start     Ordered   02/09/19 1716  For home use only DME 3 n 1  Once     02/09/19 1715   02/09/19 1716  For home use only DME Walker rolling  Once    Question Answer Comment  Walker: With 5 Inch Wheels   Patient needs a walker to treat with the following condition Weakness      02/09/19 1715         Allergies  Allergen Reactions  . Lipitor [Atorvastatin] Other (See Comments)    myalgia  . Zocor [Simvastatin] Other (See Comments)    myalgias      The results of significant diagnostics from this hospitalization (including imaging, microbiology, ancillary and laboratory) are listed below for reference.    Significant Diagnostic Studies: DG Chest 2 View  Result Date: 02/08/2019 CLINICAL DATA:  Shortness of breath and wheezing EXAM: CHEST - 2 VIEW COMPARISON:  February 07, 2019 FINDINGS: There is airspace opacity throughout much of the right lower lobe. There may be mild pleural effusions superimposed on the right. There is atelectatic change in the left  base. Heart size and pulmonary vascularity within normal limits. No adenopathy. There is aortic atherosclerosis. No bone lesions. IMPRESSION: Right lower lobe airspace opacity consistent with pneumonia. Suspect small pleural effusions superimposed on the right. Atelectatic change left base. Heart size within normal limits. No adenopathy appreciable. Aortic Atherosclerosis (ICD10-I70.0). Electronically Signed   By: Lowella Grip III M.D.   On: 02/08/2019 08:09   CT HEAD WO CONTRAST  Result Date: 02/05/2019 CLINICAL DATA:  Altered mental status EXAM: CT HEAD WITHOUT CONTRAST TECHNIQUE: Contiguous axial images were  obtained from the base of the skull through the vertex without intravenous contrast. COMPARISON:  None. FINDINGS: Brain: Mild atrophic changes are noted. No findings to suggest acute hemorrhage, acute infarction or space-occupying mass lesion are noted. Vascular: No hyperdense vessel or unexpected calcification. Skull: Normal. Negative for fracture or focal lesion. Sinuses/Orbits: No acute finding. Other: None. IMPRESSION: Mild atrophic changes without acute abnormality. Electronically Signed   By: Inez Catalina M.D.   On: 02/05/2019 19:16   CT CHEST W CONTRAST  Result Date: 02/09/2019 CLINICAL DATA:  69 year old female with history of progressive weakness. Hypercalcemia. Evaluate for potential malignancy such as lymphoma. EXAM: CT CHEST, ABDOMEN, AND PELVIS WITH CONTRAST TECHNIQUE: Multidetector CT imaging of the chest, abdomen and pelvis was performed following the standard protocol during bolus administration of intravenous contrast. CONTRAST:  130mL OMNIPAQUE IOHEXOL 300 MG/ML  SOLN COMPARISON:  Chest CT 01/07/2014. CT the abdomen and pelvis 11/24/2012. FINDINGS: CT CHEST FINDINGS Cardiovascular: Heart size is normal. There is no significant pericardial fluid, thickening or pericardial calcification. There is aortic atherosclerosis, as well as atherosclerosis of the great vessels of the  mediastinum and the coronary arteries, including calcified atherosclerotic plaque in the left main, left anterior descending and right coronary arteries. Mediastinum/Nodes: No pathologically enlarged mediastinal or hilar lymph nodes. Esophagus is unremarkable in appearance. No axillary lymphadenopathy. Lungs/Pleura: Moderate bilateral pleural effusions lying dependently with areas of passive atelectasis in the lower lobes of the lungs bilaterally. Patchy areas of very mild ground-glass attenuation are noted in the lungs bilaterally, nonspecific. No definite consolidative airspace disease. No suspicious appearing pulmonary nodules or masses are noted. Musculoskeletal: There are no aggressive appearing lytic or blastic lesions noted in the visualized portions of the skeleton. CT ABDOMEN PELVIS FINDINGS Hepatobiliary: No suspicious cystic or solid hepatic lesions. No intra or extrahepatic biliary ductal dilatation. Diffuse gallbladder wall edema. No calcified gallstones. No overt pericholecystic inflammatory changes. Pancreas: No pancreatic mass. No pancreatic ductal dilatation. No pancreatic or peripancreatic fluid collections or inflammatory changes. Spleen: Unremarkable. Adrenals/Urinary Tract: Spleen measures up to 12.9 x 4.6 x 11.5 cm (estimated splenic volume of 341 mL), which is upper limits of normal, and is unremarkable in appearance. Stomach/Bowel: Normal appearance of the stomach. No pathologic dilatation of small bowel or colon. A few scattered colonic diverticulae are noted, particularly in the sigmoid colon, without surrounding inflammatory changes to suggest an acute diverticulitis at this time. Normal appendix. Vascular/Lymphatic: Aortic atherosclerosis, without evidence of aneurysm or dissection in the abdominal or pelvic vasculature. No lymphadenopathy noted in the abdomen or pelvis. Reproductive: Uterus and ovaries are unremarkable in appearance. Other: Small volume of ascites, most evident adjacent  to the liver. No pneumoperitoneum. Musculoskeletal: There are no aggressive appearing lytic or blastic lesions noted in the visualized portions of the skeleton. IMPRESSION: 1. No definite evidence of lymphoma or other malignancy. 2. Moderate bilateral pleural effusions lying dependently with extensive passive atelectasis in the lower lobes of the lungs bilaterally. 3. Trace volume of ascites. 4. Gallbladder wall edema, nonspecific in the setting of small volume of ascites. No definite gallstones. Gallbladder does not appear distended, nor are there are surrounding pericholecystic inflammatory changes to suggest an acute cholecystitis at this time. 5. Aortic atherosclerosis, in addition to left main and 2 vessel coronary artery disease. Assessment for potential risk factor modification, dietary therapy or pharmacologic therapy may be warranted, if clinically indicated. 6. Colonic diverticulosis without evidence of acute diverticulitis at this time. Electronically Signed   By: Mauri Brooklyn.D.  On: 02/09/2019 17:50   CT ABDOMEN PELVIS W CONTRAST  Result Date: 02/09/2019 CLINICAL DATA:  69 year old female with history of progressive weakness. Hypercalcemia. Evaluate for potential malignancy such as lymphoma. EXAM: CT CHEST, ABDOMEN, AND PELVIS WITH CONTRAST TECHNIQUE: Multidetector CT imaging of the chest, abdomen and pelvis was performed following the standard protocol during bolus administration of intravenous contrast. CONTRAST:  171mL OMNIPAQUE IOHEXOL 300 MG/ML  SOLN COMPARISON:  Chest CT 01/07/2014. CT the abdomen and pelvis 11/24/2012. FINDINGS: CT CHEST FINDINGS Cardiovascular: Heart size is normal. There is no significant pericardial fluid, thickening or pericardial calcification. There is aortic atherosclerosis, as well as atherosclerosis of the great vessels of the mediastinum and the coronary arteries, including calcified atherosclerotic plaque in the left main, left anterior descending and right  coronary arteries. Mediastinum/Nodes: No pathologically enlarged mediastinal or hilar lymph nodes. Esophagus is unremarkable in appearance. No axillary lymphadenopathy. Lungs/Pleura: Moderate bilateral pleural effusions lying dependently with areas of passive atelectasis in the lower lobes of the lungs bilaterally. Patchy areas of very mild ground-glass attenuation are noted in the lungs bilaterally, nonspecific. No definite consolidative airspace disease. No suspicious appearing pulmonary nodules or masses are noted. Musculoskeletal: There are no aggressive appearing lytic or blastic lesions noted in the visualized portions of the skeleton. CT ABDOMEN PELVIS FINDINGS Hepatobiliary: No suspicious cystic or solid hepatic lesions. No intra or extrahepatic biliary ductal dilatation. Diffuse gallbladder wall edema. No calcified gallstones. No overt pericholecystic inflammatory changes. Pancreas: No pancreatic mass. No pancreatic ductal dilatation. No pancreatic or peripancreatic fluid collections or inflammatory changes. Spleen: Unremarkable. Adrenals/Urinary Tract: Spleen measures up to 12.9 x 4.6 x 11.5 cm (estimated splenic volume of 341 mL), which is upper limits of normal, and is unremarkable in appearance. Stomach/Bowel: Normal appearance of the stomach. No pathologic dilatation of small bowel or colon. A few scattered colonic diverticulae are noted, particularly in the sigmoid colon, without surrounding inflammatory changes to suggest an acute diverticulitis at this time. Normal appendix. Vascular/Lymphatic: Aortic atherosclerosis, without evidence of aneurysm or dissection in the abdominal or pelvic vasculature. No lymphadenopathy noted in the abdomen or pelvis. Reproductive: Uterus and ovaries are unremarkable in appearance. Other: Small volume of ascites, most evident adjacent to the liver. No pneumoperitoneum. Musculoskeletal: There are no aggressive appearing lytic or blastic lesions noted in the visualized  portions of the skeleton. IMPRESSION: 1. No definite evidence of lymphoma or other malignancy. 2. Moderate bilateral pleural effusions lying dependently with extensive passive atelectasis in the lower lobes of the lungs bilaterally. 3. Trace volume of ascites. 4. Gallbladder wall edema, nonspecific in the setting of small volume of ascites. No definite gallstones. Gallbladder does not appear distended, nor are there are surrounding pericholecystic inflammatory changes to suggest an acute cholecystitis at this time. 5. Aortic atherosclerosis, in addition to left main and 2 vessel coronary artery disease. Assessment for potential risk factor modification, dietary therapy or pharmacologic therapy may be warranted, if clinically indicated. 6. Colonic diverticulosis without evidence of acute diverticulitis at this time. Electronically Signed   By: Vinnie Langton M.D.   On: 02/09/2019 17:50   DG CHEST PORT 1 VIEW  Result Date: 02/07/2019 CLINICAL DATA:  Shortness of breath EXAM: PORTABLE CHEST 1 VIEW COMPARISON:  February 24, 2013 FINDINGS: No pneumothorax. The heart, hila, and mediastinum are normal. Left lung is clear. Haziness over the right base is identified, asymmetric to the left. IMPRESSION: Haziness over the right base, asymmetric to the left, could represent atelectasis or developing infiltrate. A PA and lateral  chest x-ray may better evaluate. No other acute abnormalities identified. Electronically Signed   By: Dorise Bullion III M.D   On: 02/07/2019 12:52    Microbiology: Recent Results (from the past 240 hour(s))  SARS CORONAVIRUS 2 (TAT 6-24 HRS) Nasopharyngeal Nasopharyngeal Swab     Status: None   Collection Time: 02/03/19  2:08 PM   Specimen: Nasopharyngeal Swab  Result Value Ref Range Status   SARS Coronavirus 2 NEGATIVE NEGATIVE Final    Comment: (NOTE) SARS-CoV-2 target nucleic acids are NOT DETECTED. The SARS-CoV-2 RNA is generally detectable in upper and lower respiratory specimens  during the acute phase of infection. Negative results do not preclude SARS-CoV-2 infection, do not rule out co-infections with other pathogens, and should not be used as the sole basis for treatment or other patient management decisions. Negative results must be combined with clinical observations, patient history, and epidemiological information. The expected result is Negative. Fact Sheet for Patients: SugarRoll.be Fact Sheet for Healthcare Providers: https://www.woods-mathews.com/ This test is not yet approved or cleared by the [place] FDA and  has been authorized for detection and/or diagnosis of SARS-CoV-2 by FDA under an Emergency Use Authorization (EUA). This EUA will remain  in effect (meaning this test can be used) for the duration of the COVID-19 declaration under Section 56 4(b)(1) of the Act, 21 U.S.C. section 360bbb-3(b)(1), unless the authorization is terminated or revoked sooner. Performed at [facility], [place] [address], [place], [place] [address]   Aerobic/Anaerobic Culture (surgical/deep wound)     Status: None (Preliminary result)   Collection Time: 02/07/19 11:03 AM   Specimen: Vaginal  Result Value Ref Range Status   Specimen Description VAGINA  Final   Special Requests LOOKING FOR YEAST  Final   Gram Stain   Final    NO WBC SEEN ABUNDANT GRAM POSITIVE RODS MODERATE GRAM POSITIVE COCCI IN PAIRS IN CLUSTERS FEW GRAM NEGATIVE RODS    Culture   Final    FEW ESCHERICHIA COLI HOLDING FOR POSSIBLE ANAEROBE Performed at [facility], [place] [address] [place], [place] [address]    Report Status PENDING  Incomplete   Organism ID, Bacteria ESCHERICHIA COLI  Final      Susceptibility   Escherichia coli - MIC*    AMPICILLIN 4 SENSITIVE Sensitive     CEFAZOLIN <=4 SENSITIVE Sensitive     CEFEPIME <=0.12 SENSITIVE Sensitive     CEFTAZIDIME <=1 SENSITIVE Sensitive     CEFTRIAXONE <=0.25 SENSITIVE Sensitive      CIPROFLOXACIN <=0.25 SENSITIVE Sensitive     GENTAMICIN <=1 SENSITIVE Sensitive     IMIPENEM <=0.25 SENSITIVE Sensitive     TRIMETH/SULFA <=20 SENSITIVE Sensitive     AMPICILLIN/SULBACTAM <=2 SENSITIVE Sensitive     PIP/TAZO <=4 SENSITIVE Sensitive     * FEW ESCHERICHIA COLI     Labs: Basic Metabolic Panel: Recent Labs  Lab 02/05/19 0226 02/05/19 0739 02/06/19 0920 02/07/19 0310 02/09/19 0437 02/10/19 0405 02/11/19 0522  NA 140  --  141 139 142 140 140  K 3.4*  --  3.9 3.5 2.8* 3.6 3.7  CL 109  --  112* 112* 111 109 108  CO2 19*  --  19* 19* 21* 21* 26  GLUCOSE 97  --  85 106* 107* 88 111*  BUN 26*  --  26* 22 21 18 16   CREATININE 1.00  --  1.00 0.93 0.96 1.03* 0.98  CALCIUM 10.1   < > 10.0 10.2 10.2 10.2 9.6  MG 1.4*  --  1.6* 1.6*  --  1.5*  --   PHOS 2.0*  --  2.3* 2.8 4.0  --  3.4   < > = values in this interval not displayed.   Liver Function Tests: Recent Labs  Lab 02/06/19 0920 02/07/19 0310 02/09/19 0437 02/10/19 0405 02/11/19 0522  AST 71*  --   --  25  --   ALT 12  --   --  9  --   ALKPHOS 100  --   --  91  --   BILITOT 0.4  --   --  0.6  --   PROT 4.9*  --   --  5.0*  --   ALBUMIN 2.0* 1.9* 1.8* 1.9* 2.0*   No results for input(s): LIPASE, AMYLASE in the last 168 hours. No results for input(s): AMMONIA in the last 168 hours. CBC: Recent Labs  Lab 02/05/19 0226 02/06/19 0920 02/07/19 0310 02/09/19 0437 02/10/19 0405  WBC 10.0 8.9 8.6 7.0 6.0  NEUTROABS  --   --  6.6 5.2 4.2  HGB 11.2* 11.2* 11.3* 11.2* 11.1*  HCT 34.1* 35.1* 35.7* 35.5* 34.7*  MCV 91.2 91.6 92.0 91.7 91.8  PLT 285 298 282 265 245   Cardiac Enzymes: No results for input(s): CKTOTAL, CKMB, CKMBINDEX, TROPONINI in the last 168 hours. BNP: BNP (last 3 results) No results for input(s): BNP in the last 8760 hours.  ProBNP (last 3 results) No results for input(s): PROBNP in the last 8760 hours.  CBG: No results for input(s): GLUCAP in the last 168  hours.     Signed:  Nita Sells MD   [facility] 02/11/2019, 10:29 AM

## 2019-02-16 LAB — PTH-RELATED PEPTIDE: PTH-related peptide: <2 pmol/L

## 2019-02-22 ENCOUNTER — Telehealth: Payer: Self-pay | Admitting: *Deleted

## 2019-02-22 ENCOUNTER — Inpatient Hospital Stay (HOSPITAL_COMMUNITY)
Admission: AD | Admit: 2019-02-22 | Discharge: 2019-02-26 | DRG: 641 | Disposition: A | Discharge status: Home Health | Payer: Medicare Other | Source: Ambulatory Visit | Attending: Internal Medicine | Admitting: Internal Medicine

## 2019-02-22 DIAGNOSIS — E876 Hypokalemia: Secondary | ICD-10-CM | POA: Diagnosis present

## 2019-02-22 DIAGNOSIS — Z888 Allergy status to other drugs, medicaments and biological substances status: Secondary | ICD-10-CM | POA: Diagnosis not present

## 2019-02-22 DIAGNOSIS — Z7982 Long term (current) use of aspirin: Secondary | ICD-10-CM | POA: Diagnosis not present

## 2019-02-22 DIAGNOSIS — F419 Anxiety disorder, unspecified: Secondary | ICD-10-CM | POA: Diagnosis present

## 2019-02-22 DIAGNOSIS — N179 Acute kidney failure, unspecified: Secondary | ICD-10-CM | POA: Diagnosis present

## 2019-02-22 DIAGNOSIS — Z8249 Family history of ischemic heart disease and other diseases of the circulatory system: Secondary | ICD-10-CM

## 2019-02-22 DIAGNOSIS — E7439 Other disorders of intestinal carbohydrate absorption: Secondary | ICD-10-CM | POA: Diagnosis present

## 2019-02-22 DIAGNOSIS — E785 Hyperlipidemia, unspecified: Secondary | ICD-10-CM | POA: Diagnosis present

## 2019-02-22 DIAGNOSIS — G20 Parkinson's disease: Secondary | ICD-10-CM | POA: Diagnosis present

## 2019-02-22 DIAGNOSIS — Z79899 Other long term (current) drug therapy: Secondary | ICD-10-CM

## 2019-02-22 DIAGNOSIS — R42 Dizziness and giddiness: Secondary | ICD-10-CM | POA: Diagnosis present

## 2019-02-22 DIAGNOSIS — D860 Sarcoidosis of lung: Secondary | ICD-10-CM | POA: Diagnosis present

## 2019-02-22 DIAGNOSIS — K219 Gastro-esophageal reflux disease without esophagitis: Secondary | ICD-10-CM | POA: Diagnosis present

## 2019-02-22 DIAGNOSIS — J90 Pleural effusion, not elsewhere classified: Secondary | ICD-10-CM | POA: Diagnosis present

## 2019-02-22 DIAGNOSIS — Z20822 Contact with and (suspected) exposure to covid-19: Secondary | ICD-10-CM | POA: Diagnosis present

## 2019-02-22 DIAGNOSIS — R52 Pain, unspecified: Secondary | ICD-10-CM

## 2019-02-22 DIAGNOSIS — D869 Sarcoidosis, unspecified: Secondary | ICD-10-CM | POA: Diagnosis present

## 2019-02-22 DIAGNOSIS — H538 Other visual disturbances: Secondary | ICD-10-CM | POA: Diagnosis present

## 2019-02-22 DIAGNOSIS — I10 Essential (primary) hypertension: Secondary | ICD-10-CM | POA: Diagnosis present

## 2019-02-22 DIAGNOSIS — F329 Major depressive disorder, single episode, unspecified: Secondary | ICD-10-CM | POA: Diagnosis present

## 2019-02-22 DIAGNOSIS — G20A1 Parkinson's disease without dyskinesia, without mention of fluctuations: Secondary | ICD-10-CM | POA: Diagnosis present

## 2019-02-22 DIAGNOSIS — M25552 Pain in left hip: Secondary | ICD-10-CM | POA: Diagnosis present

## 2019-02-22 LAB — COMPREHENSIVE METABOLIC PANEL
ALT: 6 U/L (ref 0–44)
AST: 24 U/L (ref 15–41)
Albumin: 2.9 g/dL — ABNORMAL LOW (ref 3.5–5.0)
Alkaline Phosphatase: 68 U/L (ref 38–126)
Anion gap: 10 (ref 5–15)
BUN: 21 mg/dL (ref 8–23)
CO2: 24 mmol/L (ref 22–32)
Calcium: 11.2 mg/dL — ABNORMAL HIGH (ref 8.9–10.3)
Chloride: 102 mmol/L (ref 98–111)
Creatinine, Ser: 1.95 mg/dL — ABNORMAL HIGH (ref 0.44–1.00)
GFR calc Af Amer: 29 mL/min — ABNORMAL LOW (ref >60)
GFR calc non Af Amer: 25 mL/min — ABNORMAL LOW (ref >60)
Glucose, Bld: 151 mg/dL — ABNORMAL HIGH (ref 70–99)
Potassium: 3 mmol/L — ABNORMAL LOW (ref 3.5–5.1)
Sodium: 136 mmol/L (ref 135–145)
Total Bilirubin: 0.8 mg/dL (ref 0.3–1.2)
Total Protein: 6.5 g/dL (ref 6.5–8.1)

## 2019-02-22 LAB — CBC WITH DIFFERENTIAL/PLATELET
Abs Immature Granulocytes: 0.02 10*3/uL (ref 0.00–0.07)
Basophils Absolute: 0.1 10*3/uL (ref 0.0–0.1)
Basophils Relative: 1 %
Eosinophils Absolute: 0.6 10*3/uL — ABNORMAL HIGH (ref 0.0–0.5)
Eosinophils Relative: 9 %
HCT: 36.2 % (ref 36.0–46.0)
Hemoglobin: 12.1 g/dL (ref 12.0–15.0)
Immature Granulocytes: 0 %
Lymphocytes Relative: 9 %
Lymphs Abs: 0.6 10*3/uL — ABNORMAL LOW (ref 0.7–4.0)
MCH: 29.4 pg (ref 26.0–34.0)
MCHC: 33.4 g/dL (ref 30.0–36.0)
MCV: 88.1 fL (ref 80.0–100.0)
Monocytes Absolute: 0.4 10*3/uL (ref 0.1–1.0)
Monocytes Relative: 7 %
Neutro Abs: 4.2 10*3/uL (ref 1.7–7.7)
Neutrophils Relative %: 74 %
Platelets: 201 10*3/uL (ref 150–400)
RBC: 4.11 MIL/uL (ref 3.87–5.11)
RDW: 13.2 % (ref 11.5–15.5)
WBC: 5.8 10*3/uL (ref 4.0–10.5)
nRBC: 0 % (ref 0.0–0.2)

## 2019-02-22 LAB — MAGNESIUM: Magnesium: 1.7 mg/dL (ref 1.7–2.4)

## 2019-02-22 MED ORDER — ENOXAPARIN SODIUM 40 MG/0.4ML ~~LOC~~ SOLN
40.0000 mg | SUBCUTANEOUS | Status: DC
  Administered 2019-02-22 – 2019-02-24 (×3): 40 mg via SUBCUTANEOUS
  Filled 2019-02-22 (×3): qty 0.4

## 2019-02-22 MED ORDER — SENNA 8.6 MG PO TABS
1.0000 | ORAL_TABLET | Freq: Two times a day (BID) | ORAL | Status: DC
  Administered 2019-02-23 – 2019-02-26 (×7): 8.6 mg via ORAL
  Filled 2019-02-22 (×8): qty 1

## 2019-02-22 MED ORDER — TRAMADOL HCL 50 MG PO TABS
50.0000 mg | ORAL_TABLET | Freq: Four times a day (QID) | ORAL | Status: DC | PRN
  Administered 2019-02-22 – 2019-02-25 (×10): 50 mg via ORAL
  Filled 2019-02-22 (×10): qty 1

## 2019-02-22 MED ORDER — AMANTADINE HCL 100 MG PO CAPS
100.0000 mg | ORAL_CAPSULE | Freq: Every day | ORAL | Status: DC
  Administered 2019-02-22 – 2019-02-26 (×5): 100 mg via ORAL
  Filled 2019-02-22 (×5): qty 1

## 2019-02-22 MED ORDER — ROSUVASTATIN CALCIUM 5 MG PO TABS
5.0000 mg | ORAL_TABLET | Freq: Every evening | ORAL | Status: DC
  Administered 2019-02-22 – 2019-02-25 (×4): 5 mg via ORAL
  Filled 2019-02-22 (×4): qty 1

## 2019-02-22 MED ORDER — CARBIDOPA-LEVODOPA 25-100 MG PO TABS
1.0000 | ORAL_TABLET | Freq: Three times a day (TID) | ORAL | Status: DC
  Administered 2019-02-22 – 2019-02-26 (×11): 1 via ORAL
  Filled 2019-02-22 (×11): qty 1

## 2019-02-22 MED ORDER — ACETAMINOPHEN 650 MG RE SUPP
650.0000 mg | Freq: Three times a day (TID) | RECTAL | Status: DC
  Filled 2019-02-22 (×2): qty 1

## 2019-02-22 MED ORDER — CARBIDOPA-LEVODOPA ER 25-100 MG PO TBCR
1.0000 | EXTENDED_RELEASE_TABLET | Freq: Every day | ORAL | Status: DC
  Administered 2019-02-22 – 2019-02-23 (×2): 1 via ORAL
  Filled 2019-02-22 (×3): qty 1

## 2019-02-22 MED ORDER — AMLODIPINE BESYLATE 10 MG PO TABS
10.0000 mg | ORAL_TABLET | Freq: Every day | ORAL | Status: DC
  Administered 2019-02-23 – 2019-02-26 (×4): 10 mg via ORAL
  Filled 2019-02-22 (×5): qty 1

## 2019-02-22 MED ORDER — SERTRALINE HCL 100 MG PO TABS
100.0000 mg | ORAL_TABLET | Freq: Every day | ORAL | Status: DC
  Administered 2019-02-22 – 2019-02-26 (×5): 100 mg via ORAL
  Filled 2019-02-22 (×5): qty 1

## 2019-02-22 MED ORDER — ROPINIROLE HCL 1 MG PO TABS
1.0000 mg | ORAL_TABLET | Freq: Three times a day (TID) | ORAL | Status: DC
  Administered 2019-02-22 – 2019-02-26 (×11): 1 mg via ORAL
  Filled 2019-02-22 (×11): qty 1

## 2019-02-22 MED ORDER — SODIUM CHLORIDE 0.45 % IV SOLN: INTRAVENOUS | Status: DC

## 2019-02-22 MED ORDER — ACETAMINOPHEN 500 MG PO TABS
1000.0000 mg | ORAL_TABLET | Freq: Three times a day (TID) | ORAL | Status: DC
  Administered 2019-02-22 – 2019-02-26 (×11): 1000 mg via ORAL
  Filled 2019-02-22 (×11): qty 2

## 2019-02-22 MED ORDER — ASPIRIN 81 MG PO CHEW
81.0000 mg | CHEWABLE_TABLET | Freq: Every day | ORAL | Status: DC
  Administered 2019-02-23 – 2019-02-26 (×4): 81 mg via ORAL
  Filled 2019-02-22 (×5): qty 1

## 2019-02-22 MED ORDER — ZOLEDRONIC ACID 4 MG/5ML IV CONC
4.0000 mg | Freq: Once | INTRAVENOUS | Status: AC
  Administered 2019-02-22: 4 mg via INTRAVENOUS
  Filled 2019-02-22: qty 5

## 2019-02-22 NOTE — Telephone Encounter (Signed)
Received VM from pt husband regarding pt possibly being hospitalized for hypercalcemia.  No answer, LVM to return call to the office.

## 2019-02-22 NOTE — Telephone Encounter (Signed)
Received 2nd VM from pt husband stating pt has been hospitalized for hypercalcemia.  RN attempt to return call, no answer, LVM

## 2019-02-22 NOTE — H&P (Signed)
History and Physical    [place] WNU:[id number] DOB: 16-May-1950 DOA: 02/22/2019  PCP: No primary care provider on file. (Confirm with patient/family/NH records and if not entered, this has to be entered at [facility] point of entry) Patient coming from: coming from home  I have personally briefly reviewed patient's old medical records in [place]  Chief Complaint: hypercalcemia at 12.2 at Dr. Cindra Eves office (endocrine)  HPI: Destiny Roth is a 69 y.o. female with medical history significant of PD, HTN, restrictive lung disease, anxiety. glucose intolerance. She was recently hospitalized for hypercalcemia and had a thorough evaluation including PTH level 5, 24 hydroxy Vit D 65, 1,25 hydroxy Vit D nl, TSH nl.  CT scanning looking for malignancy negative. Dr. Lindi Adie was consulted who recommend Zometa 4 mg IV. She had mild AKI and at d/c Cr 1.03. Since d/c she has seen Dr. Cherlynn Perches for primary care. She saw Dr. Buddy Duty today and lab revealed recurrent hypercalcemia to 12.2, Cr 1.88, low PTH. Labs from outpatient not accessible. Patient endorses weakness, decresaed PO intake, leg and low back pain that is improving off myrbetriq. Due to rising calcium she is directly admitted from Dr. Cindra Eves office. She was scheduled to see oncology tomorrow.  (For level 3, the HPI must include 4+ descriptors: Location, Quality, Severity, Duration, Timing, Context, modifying factors, associated signs/symptoms and/or status of 3+ chronic problems.)  (Please avoid self-populating past medical history here) (The initial 2-3 lines should be focused and good to copy and paste in the HPI section of the daily progress note).  ED Course: not seen in ED  Review of Systems: As per HPI otherwise 10 point review of systems negative. No worsening tremor. No facial twitches. Unacceptable ROS statements: "10 systems reviewed," "Extensive" (without elaboration).  Acceptable ROS statements: "All others negative," "All  others reviewed and are negative," and "All others unremarkable," with at [place] documented Can't double dip - if using for HPI can't use for ROS  Past Medical History:  Diagnosis Date  . Abdominal pain, epigastric 09/18/2009  . Anosmia 11/06/2010  . ANXIETY 09/23/2006  . BUNION, RIGHT FOOT 09/18/2009  . CARPAL TUNNEL SYNDROME, BILATERAL 09/23/2006  . DEPRESSION 09/23/2006  . DIVERTICULOSIS, COLON 09/23/2006  . GERD 05/24/2008  . GLUCOSE INTOLERANCE 05/24/2008  . Hemorrhoids   . Hypercalcemia   . HYPERLIPIDEMIA 09/23/2006  . HYPERTENSION 09/23/2006  . IBS 09/23/2006  . Impaired glucose tolerance 11/04/2010  . OSTEOPOROSIS 05/24/2008  . Parkinson's disease ([place])   . Restrictive lung disease 12/20/2013    Past Surgical History:  Procedure Laterality Date  . CARDIAC CATHETERIZATION    . CATARACT EXTRACTION    . COLONOSCOPY W/ BIOPSIES  2008   Dr. Collene Mares -negative random bxs  . ESOPHAGOGASTRODUODENOSCOPY  2008   Dr. Harriette Bouillon duodenal ulcer  . FOOT SURGERY Bilateral 2015  . LEFT AND RIGHT HEART CATHETERIZATION WITH CORONARY ANGIOGRAM N/A 11/09/2013   Procedure: LEFT AND RIGHT HEART CATHETERIZATION WITH CORONARY ANGIOGRAM;  Surgeon: Peter M Martinique, MD;  Location[facility] C[facility] LAB;  Service: Cardiovascular;  Laterality: N/A;  . nasal septum repair    . s/p bilat CTS    . s/p fibroid ablation    . s/p ganglion cyst right wrist    . s/p right thumb trigger finger surgury    . SHOULDER SURGERY Right 2015  . TUBAL LIGATION     Social hx - married 19 years. Has 1 son, 1 daughter, 3 grandchildren. Lives with her husband and  they are independent in ADLs. 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    reports that she has never smoked. She has never used smokeless tobacco. She reports current alcohol use. She reports that she does not use drugs.  Allergies  Allergen Reactions  . Myrbetriq [Mirabegron] Other (See Comments)    SEVERE muscle and joint pain  . Lipitor  [Atorvastatin] Other (See Comments)    Muscle pain  . Zocor [Simvastatin] Other (See Comments)    Muscle pain    Family History  Problem Relation Age of Onset  . Peripheral vascular disease Mother        with bilat amputations  . Heart attack Mother   . Hypertension Father   . Heart disease Father 80  . Hypertension Brother   . Heart attack Brother   . Hypertension Brother   . Hypertension Brother    Unacceptable: Noncontributory, unremarkable, or negative. Acceptable: Family history reviewed and not pertinent (If you reviewed it)  Prior to Admission medications   Medication Sig Start Date End Date Taking? Authorizing Provider  acetaminophen (TYLENOL) 500 MG tablet Take 1,000 mg by mouth every 6 (six) hours as needed for mild pain.   Yes [provider]  Amantadine HCl 100 MG tablet Take 1 tablet (100 mg total) by mouth daily. 02/11/19  Yes Nita Sells, MD  amLODipine (NORVASC) 10 MG tablet Take 1 tablet (10 mg total) by mouth daily. 02/12/19  Yes Nita Sells, MD  aspirin 81 MG tablet Take 81 mg by mouth daily.   Yes [provider]  carbidopa-levodopa (SINEMET IR) 25-100 MG tablet TAKE ONE TABLET BY MOUTH THREE TIMES DAILY Patient taking differently: Take 1 tablet by mouth See admin instructions. Take 1 tablet by mouth three times a day- 8 AM, 12 PM, and between 5-6 PM 10/26/15  Yes Tat, Wells Guiles S, DO  Carbidopa-Levodopa ER (SINEMET CR) 25-100 MG tablet controlled release Take 1 tablet by mouth at bedtime. 03/07/15  Yes Tat, Eustace Quail, DO  hydrocortisone (ANUSOL-HC) 25 MG suppository Place 1 suppository (25 mg total) rectally at bedtime as needed for hemorrhoids or itching. 02/21/16  Yes Irene Shipper, MD  rOPINIRole (REQUIP) 1 MG tablet Take 1 tablet (1 mg total) by mouth 3 (three) times daily. Patient taking differently: Take 1 mg by mouth See admin instructions. Take 1 mg by mouth three times a day- 8 AM, 12 PM, and between 5-6 PM 02/11/19  Yes  Nita Sells, MD  rosuvastatin (CRESTOR) 5 MG tablet Take 5 mg by mouth every evening.  01/10/19  Yes [provider]  sertraline (ZOLOFT) 100 MG tablet Take 100 mg by mouth daily. 01/10/19  Yes [provider]  furosemide (LASIX) 20 MG tablet Take 1 tablet (20 mg total) by mouth 2 (two) times daily for 2 doses. Patient not taking: Reported on 02/22/2019 02/11/19 02/22/19  Nita Sells, MD  mirabegron ER (MYRBETRIQ) 25 MG TB24 tablet Take 1 tablet (25 mg total) by mouth daily. Patient not taking: Reported on 02/22/2019 02/12/19   Nita Sells, MD    Physical Exam: Vitals:   02/22/19 1422  BP: 139/80  Pulse: (!) 105  Resp: 18  Temp: 97.7 F (36.5 C)  TempSrc: Axillary  SpO2: 98%    Constitutional: NAD, calm, comfortable Vitals:   02/22/19 1422  BP: 139/80  Pulse: (!) 105  Resp: 18  Temp: 97.7 F (36.5 C)  TempSrc: Axillary  SpO2: 98%   General -  WNWD woman in  no distresss Eyes: PERRL, lids and conjunctivae normal ENMT: Mucous membranes are moist. Posterior pharynx clear of any exudate or lesions.Normal dentition.  Neck: normal, supple, no masses, no thyromegaly Respiratory: clear to auscultation bilaterally, no wheezing, no crackles. Normal respiratory effort. No accessory muscle use.  Cardiovascular: Regular tachycardia and rhythm, no murmurs / rubs / gallops. No extremity edema. 2+ pedal pulses. No carotid bruits.  Abdomen: no tenderness, no masses palpated. No hepatosplenomegaly. Bowel sounds positive.  Musculoskeletal: no clubbing / cyanosis. No joint deformity upper and lower extremities. Good ROM, no contractures. Normal muscle tone.  Skin: no rashes, lesions, ulcers. No induration Neurologic: CN 2-12 grossly intact. No facial twitch to percussion.  Sensation intact, DTR normal. Strength 5/5 in all 4. No resting tremor. NO cog-wheeling. Patient was not ambulated.  Psychiatric: Normal judgment and insight. Alert and oriented x 3.  Normal mood.   (Anything < 9 systems with 2 bullets each down codes to level 1) (If patient refuses exam can't bill higher level) (Make sure to document decubitus ulcers present on admission -- if possible -- and whether patient has chronic indwelling catheter at time of admission)  Labs on Admission: I have personally reviewed following labs and imaging studies  CBC: No results for input(s): WBC, NEUTROABS, HGB, HCT, MCV, PLT in the last 168 hours. Basic Metabolic Panel: No results for input(s): NA, K, CL, CO2, GLUCOSE, BUN, CREATININE, CALCIUM, MG, PHOS in the last 168 hours. GFR: CrCl cannot be calculated (Unknown ideal weight.). Liver Function Tests: No results for input(s): AST, ALT, ALKPHOS, BILITOT, PROT, ALBUMIN in the last 168 hours. No results for input(s): LIPASE, AMYLASE in the last 168 hours. No results for input(s): AMMONIA in the last 168 hours. Coagulation Profile: No results for input(s): INR, PROTIME in the last 168 hours. Cardiac Enzymes: No results for input(s): CKTOTAL, CKMB, CKMBINDEX, TROPONINI in the last 168 hours. BNP (last 3 results) No results for input(s): PROBNP in the last 8760 hours. HbA1C: No results for input(s): HGBA1C in the last 72 hours. CBG: No results for input(s): GLUCAP in the last 168 hours. Lipid Profile: No results for input(s): CHOL, HDL, LDLCALC, TRIG, CHOLHDL, LDLDIRECT in the last 72 hours. Thyroid Function Tests: No results for input(s): TSH, T4TOTAL, FREET4, T3FREE, THYROIDAB in the last 72 hours. Anemia Panel: No results for input(s): VITAMINB12, FOLATE, FERRITIN, TIBC, IRON, RETICCTPCT in the last 72 hours. Urine analysis:    Component Value Date/Time   COLORURINE YELLOW 02/04/2019 0303   APPEARANCEUR CLEAR 02/04/2019 0303   LABSPEC 1.017 02/04/2019 0303   PHURINE 5.0 02/04/2019 0303   GLUCOSEU NEGATIVE 02/04/2019 0303   GLUCOSEU NEGATIVE 07/07/2014 1225   HGBUR NEGATIVE 02/04/2019 0303   BILIRUBINUR NEGATIVE 02/04/2019  0303   KETONESUR 5 (A) 02/04/2019 0303   PROTEINUR NEGATIVE 02/04/2019 0303   UROBILINOGEN 0.2 07/07/2014 1225   NITRITE NEGATIVE 02/04/2019 0303   LEUKOCYTESUR SMALL (A) 02/04/2019 0303    Radiological Exams on Admission: No results found.  EKG: Independently reviewed. No EKG available  Assessment/Plan Active Problems:   Hypercalcemia   AKI (acute kidney injury) ([place])   Essential hypertension   PD (Parkinson's disease) ([place])   Hyperlipidemia  (please populate well all problems here in Problem List. (For example, if patient is on BP meds at home and you resume or decide to hold them, it is a problem that needs to be her. Same for CAD, COPD, HLD and so on)   1. Hypercalcemia - a recurrent problem of unknown origin.  Calcium is rising but she is asymptomatic at admission. Plan IV hydration  Zometa 4 mg IV single dose with f/u calcium level in AM  Oncology consult called - work up for multiple myeloma, other  2. AKI - recurrent AKI, possibly prerenal Plan IV hydration 1/2 NS @ 75cc/hr x 24 hrs  F/u lab  3. HTN- continue home meds but hold diuretic  4. PD - continue home medications  DVT prophylaxis: lovenox (Lovenox/Heparin/SCD's/anticoagulated/None (if comfort care) Code Status: partial code - DNI   (Full/Partial (specify details) Family Communication: husband in the room. All questions answered (Specify name, relationship. Do not write "discussed with patient". Specify tel # if discussed over the phone) Disposition Plan: home when stable  (specify when and where you expect patient to be discharged) Consults called: Oncology -spoke with Dr. Burr Medico. She will enter myeloma labs. Patient to be seen tomorrow. (with names) Admission status: inpatient (inpatient / obs / tele / medical floor / SDU)   Adella Hare MD [facility] Pager [phone]  If 7PM-7AM, please contact night-coverage [email or web address] Password [facility]  02/22/2019, 5:05 PM

## 2019-02-23 ENCOUNTER — Encounter (HOSPITAL_COMMUNITY): Payer: Self-pay | Admitting: Internal Medicine

## 2019-02-23 ENCOUNTER — Inpatient Hospital Stay (HOSPITAL_COMMUNITY): Payer: Medicare Other

## 2019-02-23 ENCOUNTER — Encounter: Payer: Medicare Other | Admitting: Hematology and Oncology

## 2019-02-23 ENCOUNTER — Other Ambulatory Visit: Payer: Self-pay

## 2019-02-23 DIAGNOSIS — I10 Essential (primary) hypertension: Secondary | ICD-10-CM

## 2019-02-23 DIAGNOSIS — N179 Acute kidney failure, unspecified: Secondary | ICD-10-CM

## 2019-02-23 DIAGNOSIS — G20 Parkinson's disease: Secondary | ICD-10-CM

## 2019-02-23 DIAGNOSIS — J90 Pleural effusion, not elsewhere classified: Secondary | ICD-10-CM

## 2019-02-23 LAB — BASIC METABOLIC PANEL
Anion gap: 9 (ref 5–15)
BUN: 20 mg/dL (ref 8–23)
CO2: 23 mmol/L (ref 22–32)
Calcium: 11.5 mg/dL — ABNORMAL HIGH (ref 8.9–10.3)
Chloride: 102 mmol/L (ref 98–111)
Creatinine, Ser: 1.75 mg/dL — ABNORMAL HIGH (ref 0.44–1.00)
GFR calc Af Amer: 34 mL/min — ABNORMAL LOW (ref >60)
GFR calc non Af Amer: 29 mL/min — ABNORMAL LOW (ref >60)
Glucose, Bld: 100 mg/dL — ABNORMAL HIGH (ref 70–99)
Potassium: 3.1 mmol/L — ABNORMAL LOW (ref 3.5–5.1)
Sodium: 134 mmol/L — ABNORMAL LOW (ref 135–145)

## 2019-02-23 LAB — IRON AND TIBC
Iron: 41 ug/dL (ref 28–170)
Saturation Ratios: 14 % (ref 10.4–31.8)
TIBC: 293 ug/dL (ref 250–450)
UIBC: 252 ug/dL

## 2019-02-23 LAB — FERRITIN: Ferritin: 272 ng/mL (ref 11–307)

## 2019-02-23 MED ORDER — POTASSIUM CHLORIDE CRYS ER 20 MEQ PO TBCR
40.0000 meq | EXTENDED_RELEASE_TABLET | Freq: Once | ORAL | Status: AC
  Administered 2019-02-23: 40 meq via ORAL
  Filled 2019-02-23: qty 2

## 2019-02-23 NOTE — Consult Note (Addendum)
[facility]  [phone] [phone]   MEDICAL ONCOLOGY - INITIAL CONSULTATION  Referral MD: Dr. Niel Hummer  Reason for Referral: Hypercalcemia  HPI: Ms. Trager is a 69 year old female with a past medical history significant for Parkinson's disease, hypertension, restrictive lung disease, and anxiety.  The patient was recently hospitalized for hypercalcemia and had a thorough work-up including a CT scan which was negative for malignancy.  During that hospitalization, she also had an SPEP performed on 02/03/2019 and an M spike was not observed.  Light chains were also checked on the same date which showed a mildly elevated kappa free light chain and mildly elevated lambda free light chain with a normal kappa/lambda light chain ratio.  During that hospitalization, hospitalist spoke with oncology who recommended Zometa 4 mg IV.  The patient's hypercalcemia improved and she was discharged with plans to follow-up with endocrinology and she was scheduled to see medical oncology as an outpatient today.  However, she had lab work performed at her endocrinologist office yesterday which showed a calcium level of 12.2 and creatinine of 1.88.  The patient was reporting worsening weakness, decreased p.o. intake, and low back/hip pain.  Due to her rising calcium level, she was admitted to the hospital from her endocrinologist office.  Labs obtained upon admission to the hospital showed a creatinine of 1.95, calcium 11.2, and albumin 2.9.  CBC showed a normal WBC, hemoglobin, and platelet count.  The patient was given Zometa 4 mg IV around 7 PM last evening.  She was also started on IV fluids.  Her husband is at the bedside at the time of my visit. The patient reports ongoing fatigue and weakness.  She reports a sensation of dizziness/vertigo and reports blurry vision.  She is not complaining of any fevers or chills or any headaches.  She reports a total loss of smell which started  many years ago.  She also reports taste changes.  She has no chest discomfort or shortness of breath.  Denies abdominal pain but reports some mild nausea without any vomiting.  Denies constipation or diarrhea.  She has not noticed any bleeding.  No skin rashes.  She reports low back pain and left hip pain.  The patient is married.  She has 2 children.  Denies history of alcohol and tobacco use.  Family history significant for mother with sarcoidosis.  Last colonoscopy was performed in 2008.  Last mammogram was in 2017 and was normal.  Medical oncology was asked see the patient to make recommendations regarding her recurrent hypercalcemia.   Past Medical History:  Diagnosis Date  . Abdominal pain, epigastric 09/18/2009  . Anosmia 11/06/2010  . ANXIETY 09/23/2006  . BUNION, RIGHT FOOT 09/18/2009  . CARPAL TUNNEL SYNDROME, BILATERAL 09/23/2006  . DEPRESSION 09/23/2006  . DIVERTICULOSIS, COLON 09/23/2006  . GERD 05/24/2008  . GLUCOSE INTOLERANCE 05/24/2008  . Hemorrhoids   . Hypercalcemia   . HYPERLIPIDEMIA 09/23/2006  . HYPERTENSION 09/23/2006  . IBS 09/23/2006  . Impaired glucose tolerance 11/04/2010  . OSTEOPOROSIS 05/24/2008  . Parkinson's disease ([place])   . Restrictive lung disease 12/20/2013  :  Past Surgical History:  Procedure Laterality Date  . CARDIAC CATHETERIZATION    . CATARACT EXTRACTION    . COLONOSCOPY W/ BIOPSIES  2008   Dr. Collene Mares -negative random bxs  . ESOPHAGOGASTRODUODENOSCOPY  2008   Dr. Harriette Bouillon duodenal ulcer  . FOOT SURGERY Bilateral 2015  . LEFT AND RIGHT HEART CATHETERIZATION WITH CORONARY ANGIOGRAM N/A 11/09/2013  Procedure: LEFT AND RIGHT HEART CATHETERIZATION WITH CORONARY ANGIOGRAM;  Surgeon: Peter M Martinique, MD;  Location: [facility] CATH LAB;  Service: Cardiovascular;  Laterality: N/A;  . nasal septum repair    . s/p bilat CTS    . s/p fibroid ablation    . s/p ganglion cyst right wrist    . s/p right thumb trigger finger surgury    . SHOULDER SURGERY Right 2015  .  TUBAL LIGATION    :  Current Facility-Administered Medications  Medication Dose Route Frequency Provider Last Rate Last Admin  . 0.45 % sodium chloride infusion   Intravenous Continuous Regalado, Belkys A, MD 100 mL/hr at 02/23/19 1025 Rate Change at 02/23/19 1025  . acetaminophen (TYLENOL) tablet 1,000 mg  1,000 mg Oral TID Neena Rhymes, MD   1,000 mg at 02/23/19 4098   Or  . acetaminophen (TYLENOL) suppository 650 mg  650 mg Rectal TID Neena Rhymes, MD      . amantadine (SYMMETREL) capsule 100 mg  100 mg Oral Daily Norins, Heinz Knuckles, MD   100 mg at 02/23/19 1025  . amLODipine (NORVASC) tablet 10 mg  10 mg Oral Daily Norins, Heinz Knuckles, MD   10 mg at 02/23/19 0856  . aspirin chewable tablet 81 mg  81 mg Oral Daily Neena Rhymes, MD   81 mg at 02/23/19 0855  . carbidopa-levodopa (SINEMET IR) 25-100 MG per tablet immediate release 1 tablet  1 tablet Oral TID Norins, Heinz Knuckles, MD   1 tablet at 02/23/19 1150  . Carbidopa-Levodopa ER (SINEMET CR) 25-100 MG tablet controlled release 1 tablet  1 tablet Oral QHS Norins, Heinz Knuckles, MD   1 tablet at 02/22/19 2124  . enoxaparin (LOVENOX) injection 40 mg  40 mg Subcutaneous Q24H Norins, Heinz Knuckles, MD   40 mg at 02/22/19 1739  . rOPINIRole (REQUIP) tablet 1 mg  1 mg Oral TID Neena Rhymes, MD   1 mg at 02/23/19 1150  . rosuvastatin (CRESTOR) tablet 5 mg  5 mg Oral QPM Norins, Heinz Knuckles, MD   5 mg at 02/22/19 1739  . senna (SENOKOT) tablet 8.6 mg  1 tablet Oral BID Neena Rhymes, MD   8.6 mg at 02/23/19 0855  . sertraline (ZOLOFT) tablet 100 mg  100 mg Oral Daily Norins, Heinz Knuckles, MD   100 mg at 02/23/19 0855  . traMADol (ULTRAM) tablet 50 mg  50 mg Oral Q6H PRN Norins, Heinz Knuckles, MD   50 mg at 02/23/19 1150     Allergies  Allergen Reactions  . Myrbetriq [Mirabegron] Other (See Comments)    SEVERE muscle and joint pain  . Lipitor [Atorvastatin] Other (See Comments)    Muscle pain  . Zocor [Simvastatin] Other (See Comments)     Muscle pain  :  Family History  Problem Relation Age of Onset  . Peripheral vascular disease Mother        with bilat amputations  . Heart attack Mother   . Hypertension Father   . Heart disease Father 58  . Hypertension Brother   . Heart attack Brother   . Hypertension Brother   . Hypertension Brother   :  Social History   Socioeconomic History  . Marital status: Married    Spouse name: Not on file  . Number of children: 2  . Years of education: Not on file  . Highest education level: Not on file  Occupational History  . Not on file  Tobacco Use  .  Smoking status: Never Smoker  . Smokeless tobacco: Never Used  Substance and Sexual Activity  . Alcohol use: Yes    Comment: Occ  . Drug use: No  . Sexual activity: Yes    Partners: Male    Birth control/protection: Post-menopausal  Other Topics Concern  . Not on file  Social History Narrative  . Not on file   Social Determinants of Health   Financial Resource Strain:   . Difficulty of Paying Living Expenses: Not on file  Food Insecurity:   . Worried About Charity fundraiser in the Last Year: Not on file  . Ran Out of Food in the Last Year: Not on file  Transportation Needs:   . Lack of Transportation (Medical): Not on file  . Lack of Transportation (Non-Medical): Not on file  Physical Activity:   . Days of Exercise per Week: Not on file  . Minutes of Exercise per Session: Not on file  Stress:   . Feeling of Stress : Not on file  Social Connections:   . Frequency of Communication with Friends and Family: Not on file  . Frequency of Social Gatherings with Friends and Family: Not on file  . Attends Religious Services: Not on file  . Active Member of Clubs or Organizations: Not on file  . Attends Archivist Meetings: Not on file  . Marital Status: Not on file  Intimate Partner Violence:   . Fear of Current or Ex-Partner: Not on file  . Emotionally Abused: Not on file  . Physically Abused: Not on  file  . Sexually Abused: Not on file  :  Review of Systems: A comprehensive 14 point review of systems was negative except as noted in the HPI.  Exam: Patient Vitals for the past 24 hrs:  BP Temp Temp src Pulse Resp SpO2  02/23/19 0514 121/62 -- -- 78 18 99 %  02/23/19 0511 (!) 152/72 -- -- 79 18 98 %  02/23/19 0437 (!) 145/70 (!) 97.5 F (36.4 C) Oral 66 16 97 %  02/22/19 2148 134/83 (!) 97.4 F (36.3 C) Oral 82 18 98 %  02/22/19 1422 139/80 97.7 F (36.5 C) Axillary (!) 105 18 98 %    General: Alert, no acute distress.   Eyes:  PERRL, EOMI, no scleral icterus.   ENT:  There were no oropharyngeal lesions.   Neck was without thyromegaly.   Lymphatics:  Negative cervical, supraclavicular or axillary adenopathy.   Breast exam: Bilateral breasts without any palpable masses or skin changes. Respiratory: lungs were clear bilaterally without wheezing or crackles.   Cardiovascular:  Regular rate and rhythm, S1/S2, without murmur, rub or gallop.  There was trace pedal edema.   GI:  abdomen was soft, flat, nontender, nondistended, without organomegaly.   Musculoskeletal:  no spinal tenderness of palpation of vertebral spine.   Skin exam was without echymosis, petichae.   Neuro exam was nonfocal. Patient was alert and oriented.  Attention was good.   Has some difficulty finding words at times but was otherwise appropriate.  Mood was normal without depression.  Speech was not pressured.    Lab Results  Component Value Date   WBC 5.8 02/22/2019   HGB 12.1 02/22/2019   HCT 36.2 02/22/2019   PLT 201 02/22/2019   GLUCOSE 100 (H) 02/23/2019   CHOL 106 02/06/2019   TRIG 240 (H) 02/06/2019   HDL <10 (L) 02/06/2019   LDLDIRECT 104.0 07/07/2014   LDLCALC NOT CALCULATED 02/06/2019  ALT 6 02/22/2019   AST 24 02/22/2019   NA 134 (L) 02/23/2019   K 3.1 (L) 02/23/2019   CL 102 02/23/2019   CREATININE 1.75 (H) 02/23/2019   BUN 20 02/23/2019   CO2 23 02/23/2019    DG Chest 2  View  Result Date: 02/23/2019 CLINICAL DATA:  Pleural effusion. Chest heaviness. EXAM: CHEST - 2 VIEW COMPARISON:  Chest radiographs 02/08/2019 and CT 02/09/2019 FINDINGS: The cardiomediastinal silhouette is within normal limits. Aortic atherosclerosis is noted. There is improved aeration of the lungs bilaterally with mild residual opacity in the right lung base. Pleural effusions have greatly decreased in size, with trace pleural fluid remaining bilaterally. No edema or pneumothorax is identified. No acute osseous abnormality is seen. IMPRESSION: 1. Trace pleural effusions, greatly decreased in size from the prior CT. 2. Improved bilateral lung aeration with mild residual right basilar opacity which may represent atelectasis. Electronically Signed   By: Logan Bores M.D.   On: 02/23/2019 08:59   DG Chest 2 View  Result Date: 02/08/2019 CLINICAL DATA:  Shortness of breath and wheezing EXAM: CHEST - 2 VIEW COMPARISON:  February 07, 2019 FINDINGS: There is airspace opacity throughout much of the right lower lobe. There may be mild pleural effusions superimposed on the right. There is atelectatic change in the left base. Heart size and pulmonary vascularity within normal limits. No adenopathy. There is aortic atherosclerosis. No bone lesions. IMPRESSION: Right lower lobe airspace opacity consistent with pneumonia. Suspect small pleural effusions superimposed on the right. Atelectatic change left base. Heart size within normal limits. No adenopathy appreciable. Aortic Atherosclerosis (ICD10-I70.0). Electronically Signed   By: Lowella Grip III M.D.   On: 02/08/2019 08:09   CT HEAD WO CONTRAST  Result Date: 02/05/2019 CLINICAL DATA:  Altered mental status EXAM: CT HEAD WITHOUT CONTRAST TECHNIQUE: Contiguous axial images were obtained from the base of the skull through the vertex without intravenous contrast. COMPARISON:  None. FINDINGS: Brain: Mild atrophic changes are noted. No findings to suggest acute  hemorrhage, acute infarction or space-occupying mass lesion are noted. Vascular: No hyperdense vessel or unexpected calcification. Skull: Normal. Negative for fracture or focal lesion. Sinuses/Orbits: No acute finding. Other: None. IMPRESSION: Mild atrophic changes without acute abnormality. Electronically Signed   By: Inez Catalina M.D.   On: 02/05/2019 19:16   CT CHEST W CONTRAST  Result Date: 02/09/2019 CLINICAL DATA:  70 year old female with history of progressive weakness. Hypercalcemia. Evaluate for potential malignancy such as lymphoma. EXAM: CT CHEST, ABDOMEN, AND PELVIS WITH CONTRAST TECHNIQUE: Multidetector CT imaging of the chest, abdomen and pelvis was performed following the standard protocol during bolus administration of intravenous contrast. CONTRAST:  162m OMNIPAQUE IOHEXOL 300 MG/ML  SOLN COMPARISON:  Chest CT 01/07/2014. CT the abdomen and pelvis 11/24/2012. FINDINGS: CT CHEST FINDINGS Cardiovascular: Heart size is normal. There is no significant pericardial fluid, thickening or pericardial calcification. There is aortic atherosclerosis, as well as atherosclerosis of the great vessels of the mediastinum and the coronary arteries, including calcified atherosclerotic plaque in the left main, left anterior descending and right coronary arteries. Mediastinum/Nodes: No pathologically enlarged mediastinal or hilar lymph nodes. Esophagus is unremarkable in appearance. No axillary lymphadenopathy. Lungs/Pleura: Moderate bilateral pleural effusions lying dependently with areas of passive atelectasis in the lower lobes of the lungs bilaterally. Patchy areas of very mild ground-glass attenuation are noted in the lungs bilaterally, nonspecific. No definite consolidative airspace disease. No suspicious appearing pulmonary nodules or masses are noted. Musculoskeletal: There are no aggressive appearing lytic  or blastic lesions noted in the visualized portions of the skeleton. CT ABDOMEN PELVIS FINDINGS  Hepatobiliary: No suspicious cystic or solid hepatic lesions. No intra or extrahepatic biliary ductal dilatation. Diffuse gallbladder wall edema. No calcified gallstones. No overt pericholecystic inflammatory changes. Pancreas: No pancreatic mass. No pancreatic ductal dilatation. No pancreatic or peripancreatic fluid collections or inflammatory changes. Spleen: Unremarkable. Adrenals/Urinary Tract: Spleen measures up to 12.9 x 4.6 x 11.5 cm (estimated splenic volume of 341 mL), which is upper limits of normal, and is unremarkable in appearance. Stomach/Bowel: Normal appearance of the stomach. No pathologic dilatation of small bowel or colon. A few scattered colonic diverticulae are noted, particularly in the sigmoid colon, without surrounding inflammatory changes to suggest an acute diverticulitis at this time. Normal appendix. Vascular/Lymphatic: Aortic atherosclerosis, without evidence of aneurysm or dissection in the abdominal or pelvic vasculature. No lymphadenopathy noted in the abdomen or pelvis. Reproductive: Uterus and ovaries are unremarkable in appearance. Other: Small volume of ascites, most evident adjacent to the liver. No pneumoperitoneum. Musculoskeletal: There are no aggressive appearing lytic or blastic lesions noted in the visualized portions of the skeleton. IMPRESSION: 1. No definite evidence of lymphoma or other malignancy. 2. Moderate bilateral pleural effusions lying dependently with extensive passive atelectasis in the lower lobes of the lungs bilaterally. 3. Trace volume of ascites. 4. Gallbladder wall edema, nonspecific in the setting of small volume of ascites. No definite gallstones. Gallbladder does not appear distended, nor are there are surrounding pericholecystic inflammatory changes to suggest an acute cholecystitis at this time. 5. Aortic atherosclerosis, in addition to left main and 2 vessel coronary artery disease. Assessment for potential risk factor modification, dietary therapy  or pharmacologic therapy may be warranted, if clinically indicated. 6. Colonic diverticulosis without evidence of acute diverticulitis at this time. Electronically Signed   By: Vinnie Langton M.D.   On: 02/09/2019 17:50   CT ABDOMEN PELVIS W CONTRAST  Result Date: 02/09/2019 CLINICAL DATA:  69 year old female with history of progressive weakness. Hypercalcemia. Evaluate for potential malignancy such as lymphoma. EXAM: CT CHEST, ABDOMEN, AND PELVIS WITH CONTRAST TECHNIQUE: Multidetector CT imaging of the chest, abdomen and pelvis was performed following the standard protocol during bolus administration of intravenous contrast. CONTRAST:  157m OMNIPAQUE IOHEXOL 300 MG/ML  SOLN COMPARISON:  Chest CT 01/07/2014. CT the abdomen and pelvis 11/24/2012. FINDINGS: CT CHEST FINDINGS Cardiovascular: Heart size is normal. There is no significant pericardial fluid, thickening or pericardial calcification. There is aortic atherosclerosis, as well as atherosclerosis of the great vessels of the mediastinum and the coronary arteries, including calcified atherosclerotic plaque in the left main, left anterior descending and right coronary arteries. Mediastinum/Nodes: No pathologically enlarged mediastinal or hilar lymph nodes. Esophagus is unremarkable in appearance. No axillary lymphadenopathy. Lungs/Pleura: Moderate bilateral pleural effusions lying dependently with areas of passive atelectasis in the lower lobes of the lungs bilaterally. Patchy areas of very mild ground-glass attenuation are noted in the lungs bilaterally, nonspecific. No definite consolidative airspace disease. No suspicious appearing pulmonary nodules or masses are noted. Musculoskeletal: There are no aggressive appearing lytic or blastic lesions noted in the visualized portions of the skeleton. CT ABDOMEN PELVIS FINDINGS Hepatobiliary: No suspicious cystic or solid hepatic lesions. No intra or extrahepatic biliary ductal dilatation. Diffuse gallbladder wall  edema. No calcified gallstones. No overt pericholecystic inflammatory changes. Pancreas: No pancreatic mass. No pancreatic ductal dilatation. No pancreatic or peripancreatic fluid collections or inflammatory changes. Spleen: Unremarkable. Adrenals/Urinary Tract: Spleen measures up to 12.9 x 4.6 x 11.5 cm (estimated  splenic volume of 341 mL), which is upper limits of normal, and is unremarkable in appearance. Stomach/Bowel: Normal appearance of the stomach. No pathologic dilatation of small bowel or colon. A few scattered colonic diverticulae are noted, particularly in the sigmoid colon, without surrounding inflammatory changes to suggest an acute diverticulitis at this time. Normal appendix. Vascular/Lymphatic: Aortic atherosclerosis, without evidence of aneurysm or dissection in the abdominal or pelvic vasculature. No lymphadenopathy noted in the abdomen or pelvis. Reproductive: Uterus and ovaries are unremarkable in appearance. Other: Small volume of ascites, most evident adjacent to the liver. No pneumoperitoneum. Musculoskeletal: There are no aggressive appearing lytic or blastic lesions noted in the visualized portions of the skeleton. IMPRESSION: 1. No definite evidence of lymphoma or other malignancy. 2. Moderate bilateral pleural effusions lying dependently with extensive passive atelectasis in the lower lobes of the lungs bilaterally. 3. Trace volume of ascites. 4. Gallbladder wall edema, nonspecific in the setting of small volume of ascites. No definite gallstones. Gallbladder does not appear distended, nor are there are surrounding pericholecystic inflammatory changes to suggest an acute cholecystitis at this time. 5. Aortic atherosclerosis, in addition to left main and 2 vessel coronary artery disease. Assessment for potential risk factor modification, dietary therapy or pharmacologic therapy may be warranted, if clinically indicated. 6. Colonic diverticulosis without evidence of acute diverticulitis at  this time. Electronically Signed   By: Vinnie Langton M.D.   On: 02/09/2019 17:50   DG CHEST PORT 1 VIEW  Result Date: 02/07/2019 CLINICAL DATA:  Shortness of breath EXAM: PORTABLE CHEST 1 VIEW COMPARISON:  February 24, 2013 FINDINGS: No pneumothorax. The heart, hila, and mediastinum are normal. Left lung is clear. Haziness over the right base is identified, asymmetric to the left. IMPRESSION: Haziness over the right base, asymmetric to the left, could represent atelectasis or developing infiltrate. A PA and lateral chest x-ray may better evaluate. No other acute abnormalities identified. Electronically Signed   By: Dorise Bullion III M.D   On: 02/07/2019 12:52   DG HIP UNILAT WITH PELVIS 2-3 VIEWS LEFT  Result Date: 02/23/2019 CLINICAL DATA:  Pain in the left hip since 02/12/2019. No known injury. EXAM: DG HIP (WITH OR WITHOUT PELVIS) 2-3V LEFT COMPARISON:  CT 02/09/2019 FINDINGS: No evidence of fracture or focal lesion on the left. No joint space narrowing. Chronic calcification anterior to the greater trochanter on the right relates to chronic tendinosis of the gluteal insertion. No other pelvic finding. IMPRESSION: No abnormality seen to explain left hip pain. Electronically Signed   By: Nelson Chimes M.D.   On: 02/23/2019 10:59     DG Chest 2 View  Result Date: 02/23/2019 CLINICAL DATA:  Pleural effusion. Chest heaviness. EXAM: CHEST - 2 VIEW COMPARISON:  Chest radiographs 02/08/2019 and CT 02/09/2019 FINDINGS: The cardiomediastinal silhouette is within normal limits. Aortic atherosclerosis is noted. There is improved aeration of the lungs bilaterally with mild residual opacity in the right lung base. Pleural effusions have greatly decreased in size, with trace pleural fluid remaining bilaterally. No edema or pneumothorax is identified. No acute osseous abnormality is seen. IMPRESSION: 1. Trace pleural effusions, greatly decreased in size from the prior CT. 2. Improved bilateral lung aeration  with mild residual right basilar opacity which may represent atelectasis. Electronically Signed   By: Logan Bores M.D.   On: 02/23/2019 08:59   DG Chest 2 View  Result Date: 02/08/2019 CLINICAL DATA:  Shortness of breath and wheezing EXAM: CHEST - 2 VIEW COMPARISON:  February 07, 2019  FINDINGS: There is airspace opacity throughout much of the right lower lobe. There may be mild pleural effusions superimposed on the right. There is atelectatic change in the left base. Heart size and pulmonary vascularity within normal limits. No adenopathy. There is aortic atherosclerosis. No bone lesions. IMPRESSION: Right lower lobe airspace opacity consistent with pneumonia. Suspect small pleural effusions superimposed on the right. Atelectatic change left base. Heart size within normal limits. No adenopathy appreciable. Aortic Atherosclerosis (ICD10-I70.0). Electronically Signed   By: Lowella Grip III M.D.   On: 02/08/2019 08:09   CT HEAD WO CONTRAST  Result Date: 02/05/2019 CLINICAL DATA:  Altered mental status EXAM: CT HEAD WITHOUT CONTRAST TECHNIQUE: Contiguous axial images were obtained from the base of the skull through the vertex without intravenous contrast. COMPARISON:  None. FINDINGS: Brain: Mild atrophic changes are noted. No findings to suggest acute hemorrhage, acute infarction or space-occupying mass lesion are noted. Vascular: No hyperdense vessel or unexpected calcification. Skull: Normal. Negative for fracture or focal lesion. Sinuses/Orbits: No acute finding. Other: None. IMPRESSION: Mild atrophic changes without acute abnormality. Electronically Signed   By: Inez Catalina M.D.   On: 02/05/2019 19:16   CT CHEST W CONTRAST  Result Date: 02/09/2019 CLINICAL DATA:  69 year old female with history of progressive weakness. Hypercalcemia. Evaluate for potential malignancy such as lymphoma. EXAM: CT CHEST, ABDOMEN, AND PELVIS WITH CONTRAST TECHNIQUE: Multidetector CT imaging of the chest, abdomen and pelvis  was performed following the standard protocol during bolus administration of intravenous contrast. CONTRAST:  134m OMNIPAQUE IOHEXOL 300 MG/ML  SOLN COMPARISON:  Chest CT 01/07/2014. CT the abdomen and pelvis 11/24/2012. FINDINGS: CT CHEST FINDINGS Cardiovascular: Heart size is normal. There is no significant pericardial fluid, thickening or pericardial calcification. There is aortic atherosclerosis, as well as atherosclerosis of the great vessels of the mediastinum and the coronary arteries, including calcified atherosclerotic plaque in the left main, left anterior descending and right coronary arteries. Mediastinum/Nodes: No pathologically enlarged mediastinal or hilar lymph nodes. Esophagus is unremarkable in appearance. No axillary lymphadenopathy. Lungs/Pleura: Moderate bilateral pleural effusions lying dependently with areas of passive atelectasis in the lower lobes of the lungs bilaterally. Patchy areas of very mild ground-glass attenuation are noted in the lungs bilaterally, nonspecific. No definite consolidative airspace disease. No suspicious appearing pulmonary nodules or masses are noted. Musculoskeletal: There are no aggressive appearing lytic or blastic lesions noted in the visualized portions of the skeleton. CT ABDOMEN PELVIS FINDINGS Hepatobiliary: No suspicious cystic or solid hepatic lesions. No intra or extrahepatic biliary ductal dilatation. Diffuse gallbladder wall edema. No calcified gallstones. No overt pericholecystic inflammatory changes. Pancreas: No pancreatic mass. No pancreatic ductal dilatation. No pancreatic or peripancreatic fluid collections or inflammatory changes. Spleen: Unremarkable. Adrenals/Urinary Tract: Spleen measures up to 12.9 x 4.6 x 11.5 cm (estimated splenic volume of 341 mL), which is upper limits of normal, and is unremarkable in appearance. Stomach/Bowel: Normal appearance of the stomach. No pathologic dilatation of small bowel or colon. A few scattered colonic  diverticulae are noted, particularly in the sigmoid colon, without surrounding inflammatory changes to suggest an acute diverticulitis at this time. Normal appendix. Vascular/Lymphatic: Aortic atherosclerosis, without evidence of aneurysm or dissection in the abdominal or pelvic vasculature. No lymphadenopathy noted in the abdomen or pelvis. Reproductive: Uterus and ovaries are unremarkable in appearance. Other: Small volume of ascites, most evident adjacent to the liver. No pneumoperitoneum. Musculoskeletal: There are no aggressive appearing lytic or blastic lesions noted in the visualized portions of the skeleton. IMPRESSION: 1. No definite  evidence of lymphoma or other malignancy. 2. Moderate bilateral pleural effusions lying dependently with extensive passive atelectasis in the lower lobes of the lungs bilaterally. 3. Trace volume of ascites. 4. Gallbladder wall edema, nonspecific in the setting of small volume of ascites. No definite gallstones. Gallbladder does not appear distended, nor are there are surrounding pericholecystic inflammatory changes to suggest an acute cholecystitis at this time. 5. Aortic atherosclerosis, in addition to left main and 2 vessel coronary artery disease. Assessment for potential risk factor modification, dietary therapy or pharmacologic therapy may be warranted, if clinically indicated. 6. Colonic diverticulosis without evidence of acute diverticulitis at this time. Electronically Signed   By: Vinnie Langton M.D.   On: 02/09/2019 17:50   CT ABDOMEN PELVIS W CONTRAST  Result Date: 02/09/2019 CLINICAL DATA:  69 year old female with history of progressive weakness. Hypercalcemia. Evaluate for potential malignancy such as lymphoma. EXAM: CT CHEST, ABDOMEN, AND PELVIS WITH CONTRAST TECHNIQUE: Multidetector CT imaging of the chest, abdomen and pelvis was performed following the standard protocol during bolus administration of intravenous contrast. CONTRAST:  181m OMNIPAQUE IOHEXOL  300 MG/ML  SOLN COMPARISON:  Chest CT 01/07/2014. CT the abdomen and pelvis 11/24/2012. FINDINGS: CT CHEST FINDINGS Cardiovascular: Heart size is normal. There is no significant pericardial fluid, thickening or pericardial calcification. There is aortic atherosclerosis, as well as atherosclerosis of the great vessels of the mediastinum and the coronary arteries, including calcified atherosclerotic plaque in the left main, left anterior descending and right coronary arteries. Mediastinum/Nodes: No pathologically enlarged mediastinal or hilar lymph nodes. Esophagus is unremarkable in appearance. No axillary lymphadenopathy. Lungs/Pleura: Moderate bilateral pleural effusions lying dependently with areas of passive atelectasis in the lower lobes of the lungs bilaterally. Patchy areas of very mild ground-glass attenuation are noted in the lungs bilaterally, nonspecific. No definite consolidative airspace disease. No suspicious appearing pulmonary nodules or masses are noted. Musculoskeletal: There are no aggressive appearing lytic or blastic lesions noted in the visualized portions of the skeleton. CT ABDOMEN PELVIS FINDINGS Hepatobiliary: No suspicious cystic or solid hepatic lesions. No intra or extrahepatic biliary ductal dilatation. Diffuse gallbladder wall edema. No calcified gallstones. No overt pericholecystic inflammatory changes. Pancreas: No pancreatic mass. No pancreatic ductal dilatation. No pancreatic or peripancreatic fluid collections or inflammatory changes. Spleen: Unremarkable. Adrenals/Urinary Tract: Spleen measures up to 12.9 x 4.6 x 11.5 cm (estimated splenic volume of 341 mL), which is upper limits of normal, and is unremarkable in appearance. Stomach/Bowel: Normal appearance of the stomach. No pathologic dilatation of small bowel or colon. A few scattered colonic diverticulae are noted, particularly in the sigmoid colon, without surrounding inflammatory changes to suggest an acute diverticulitis at  this time. Normal appendix. Vascular/Lymphatic: Aortic atherosclerosis, without evidence of aneurysm or dissection in the abdominal or pelvic vasculature. No lymphadenopathy noted in the abdomen or pelvis. Reproductive: Uterus and ovaries are unremarkable in appearance. Other: Small volume of ascites, most evident adjacent to the liver. No pneumoperitoneum. Musculoskeletal: There are no aggressive appearing lytic or blastic lesions noted in the visualized portions of the skeleton. IMPRESSION: 1. No definite evidence of lymphoma or other malignancy. 2. Moderate bilateral pleural effusions lying dependently with extensive passive atelectasis in the lower lobes of the lungs bilaterally. 3. Trace volume of ascites. 4. Gallbladder wall edema, nonspecific in the setting of small volume of ascites. No definite gallstones. Gallbladder does not appear distended, nor are there are surrounding pericholecystic inflammatory changes to suggest an acute cholecystitis at this time. 5. Aortic atherosclerosis, in addition to left main  and 2 vessel coronary artery disease. Assessment for potential risk factor modification, dietary therapy or pharmacologic therapy may be warranted, if clinically indicated. 6. Colonic diverticulosis without evidence of acute diverticulitis at this time. Electronically Signed   By: Vinnie Langton M.D.   On: 02/09/2019 17:50   DG CHEST PORT 1 VIEW  Result Date: 02/07/2019 CLINICAL DATA:  Shortness of breath EXAM: PORTABLE CHEST 1 VIEW COMPARISON:  February 24, 2013 FINDINGS: No pneumothorax. The heart, hila, and mediastinum are normal. Left lung is clear. Haziness over the right base is identified, asymmetric to the left. IMPRESSION: Haziness over the right base, asymmetric to the left, could represent atelectasis or developing infiltrate. A PA and lateral chest x-ray may better evaluate. No other acute abnormalities identified. Electronically Signed   By: Dorise Bullion III M.D   On: 02/07/2019  12:52   DG HIP UNILAT WITH PELVIS 2-3 VIEWS LEFT  Result Date: 02/23/2019 CLINICAL DATA:  Pain in the left hip since 02/12/2019. No known injury. EXAM: DG HIP (WITH OR WITHOUT PELVIS) 2-3V LEFT COMPARISON:  CT 02/09/2019 FINDINGS: No evidence of fracture or focal lesion on the left. No joint space narrowing. Chronic calcification anterior to the greater trochanter on the right relates to chronic tendinosis of the gluteal insertion. No other pelvic finding. IMPRESSION: No abnormality seen to explain left hip pain. Electronically Signed   By: Nelson Chimes M.D.   On: 02/23/2019 10:59   Assessment and Plan:  1.  Hypercalcemia, recurrent, unclear etiology 2.  AKI 3.  Parkinson's disease 4.  Hypertension  -We had a lengthy discussion with the patient and her husband today.  Discussed that work-up today does not show any evidence of malignancy. Sarcoidosis may also be a consideration for her recurrent hypercalcemia. Recommend UPEP and bone scan.  The patient's husband has indicated that UPEP was performed at the endocrinology office and we will follow-up with them to to obtain the results.  If UPEP and bone scan did not show any evidence of malignancy, may consider proceeding with bone marrow biopsy. -Continue IV fluids and recheck calcium and creatinine daily.  Thank you for this referral.   Mikey Bussing, DNP, AGPCNP-BC, AOCNP  Addendum  I have seen the patient, examined her. I agree with the assessment and and plan and have edited the notes.   69 year old Caucasian female, with past medical history of restrictive lung disease, hypertension, peripheral neuropathy, long-standing history of loss of small, HTN, presented with recurrent hypercalcemia, AKI, progressive weakness and inability to ambulate. I have reviewed her lab result, her SPEP was negative for M protein, both serum kappa and light chains were elevated, ratio was normal, which are against the diagnosis of multiple myeloma.  I will  collect 24-hour urine for UPEP, IFE and light chain level.  CT scan showed no evidence of malignancy, adenopathy, or pulmonary changes except a bilateral pleural effusion, which is improved from chest x-ray today.  Her hypercalcemia work-up has ruled out hyperparathyroidism, and elevated 1,25-VitD supports sarcoidosis or other granuloma disease.  But I do not see anything on exam or CT scan that we can get a tissue biopsy to confirm.  Occluded malignancy is also a possibility, I will get a bone scan for further evaluation. POEM or non-secretary MM are also small possibility.   If his above work-up on unrevealing, I will arrange a bone marrow biopsy, to rule out multiple myeloma, metastatic cancer in bone, and occasionally sarcoidosis could be diagnosed on bone marrow biopsy.  I will f/u. I have discussed the above with Dr. Buddy Duty and [facility] today.  I spent about 60 mins for her consult and coordination of her care today.  Truitt Merle  02/23/2019

## 2019-02-23 NOTE — Progress Notes (Signed)
PROGRESS NOTE    FERRAH MCCLERNON  [id number] DOB: 10/28/1950 DOA: 02/22/2019 PCP: No primary care provider on file.   Brief Narrative: 69 year old with past medical history significant for PAD, hypertension, restrictive lung disease, anxiety, glucose intolerance was recently hospitalized for hypercalcemia and had a thorough evaluation including PTH level at 5, 24 hydroxy vitamin D 65, 1.25 hydroxyvitamin D, TSH nl.CT scan for malignancy negative, since discharge, patient saw Dr Buddy Duty and labs reveal recurrent hypercalcemia, cr 1.88 and low PTH.  Patient reports weakness, decreased oral intake, leg and low back pain which have improved off of Myrbetriq.   Assessment & Plan:   Active Problems:   Hyperlipidemia   Essential hypertension   PD (Parkinson's disease) ([place])   Hypercalcemia   AKI (acute kidney injury) ([place])  1-Hypercalcemia;  Recurrent, unclear etiology.  Continue with IV fluids, increase rate.  Received Zometa time one.  PTH  Related peptide less than 2.  PTH 5, vitamin D 64. Protein electrophoresis not observed M sipke.  Will check 24 hours calcium in urine.   2-AKI;  Continue with IV fluids.  Hold lasix.  Cr trending down.   Hip pain; will get x ray.   HTN; hold diuretics.   PD; continue with sinemet.   Pleural effusion, bilateral  Reduce on x ray.  Discussed with Dr Burr Medico, recommend pulmonology evaluation to evaluate for sarcoidosis.   Hypokalemia; replete orally.   Estimated body mass index is 28.45 kg/m as calculated from the following:   Height as of 02/04/19: 4' 8.69" (1.44 m).   Weight as of 02/04/19: 59 kg.   DVT prophylaxis: lovenox Code Status: partial code Family Communication: care discussed with patient.  Disposition Plan: from home, suspect will be discharge home. Still required IV fluids for AKI and hypercalcemia.  Consultants:   Oncology    Procedures:   none  Antimicrobials:    Subjective: She has been feeling tired.    She report left hip pain. Thigh pain   Objective: Vitals:   02/22/19 2148 02/23/19 0437 02/23/19 0511 02/23/19 0514  BP: 134/83 (!) 145/70 (!) 152/72 121/62  Pulse: 82 66 79 78  Resp: 18 16 18 18   Temp: (!) 97.4 F (36.3 C) (!) 97.5 F (36.4 C)    TempSrc: Oral Oral    SpO2: 98% 97% 98% 99%    Intake/Output Summary (Last 24 hours) at 02/23/2019 1324 Last data filed at 02/23/2019 1100 Gross per 24 hour  Intake 1761.96 ml  Output 200 ml  Net 1561.96 ml   There were no vitals filed for this visit.  Examination:  General exam: Appears calm and comfortable  Respiratory system: Clear to auscultation. Respiratory effort normal. Cardiovascular system: S1 & S2 heard, RRR. No JVD, murmurs, rubs, gallops or clicks. No pedal edema. Gastrointestinal system: Abdomen is nondistended, soft and nontender. No organomegaly or masses felt. Normal bowel sounds heard. Central nervous system: Alert and oriented. No focal neurological deficits. Extremities: Symmetric 5 x 5 power. Skin: No rashes, lesions or ulcers   Data Reviewed: I have personally reviewed following labs and imaging studies  CBC: Recent Labs  Lab 02/22/19 1933  WBC 5.8  NEUTROABS 4.2  HGB 12.1  HCT 36.2  MCV 88.1  PLT 123456   Basic Metabolic Panel: Recent Labs  Lab 02/22/19 1933 02/23/19 0150  NA 136 134*  K 3.0* 3.1*  CL 102 102  CO2 24 23  GLUCOSE 151* 100*  BUN 21 20  CREATININE 1.95* 1.75*  CALCIUM 11.2*  11.5*  MG 1.7  --    GFR: CrCl cannot be calculated (Unknown ideal weight.). Liver Function Tests: Recent Labs  Lab 02/22/19 1933  AST 24  ALT 6  ALKPHOS 68  BILITOT 0.8  PROT 6.5  ALBUMIN 2.9*   No results for input(s): LIPASE, AMYLASE in the last 168 hours. No results for input(s): AMMONIA in the last 168 hours. Coagulation Profile: No results for input(s): INR, PROTIME in the last 168 hours. Cardiac Enzymes: No results for input(s): CKTOTAL, CKMB, CKMBINDEX, TROPONINI in the last 168  hours. BNP (last 3 results) No results for input(s): PROBNP in the last 8760 hours. HbA1C: No results for input(s): HGBA1C in the last 72 hours. CBG: No results for input(s): GLUCAP in the last 168 hours. Lipid Profile: No results for input(s): CHOL, HDL, LDLCALC, TRIG, CHOLHDL, LDLDIRECT in the last 72 hours. Thyroid Function Tests: No results for input(s): TSH, T4TOTAL, FREET4, T3FREE, THYROIDAB in the last 72 hours. Anemia Panel: Recent Labs    02/23/19 0150  FERRITIN 272  TIBC 293  IRON 41   Sepsis Labs: No results for input(s): PROCALCITON, LATICACIDVEN in the last 168 hours.  No results found for this or any previous visit (from the past 240 hour(s)).       Radiology Studies: DG Chest 2 View  Result Date: 02/23/2019 CLINICAL DATA:  Pleural effusion. Chest heaviness. EXAM: CHEST - 2 VIEW COMPARISON:  Chest radiographs 02/08/2019 and CT 02/09/2019 FINDINGS: The cardiomediastinal silhouette is within normal limits. Aortic atherosclerosis is noted. There is improved aeration of the lungs bilaterally with mild residual opacity in the right lung base. Pleural effusions have greatly decreased in size, with trace pleural fluid remaining bilaterally. No edema or pneumothorax is identified. No acute osseous abnormality is seen. IMPRESSION: 1. Trace pleural effusions, greatly decreased in size from the prior CT. 2. Improved bilateral lung aeration with mild residual right basilar opacity which may represent atelectasis. Electronically Signed   By: Logan Bores M.D.   On: 02/23/2019 08:59   DG HIP UNILAT WITH PELVIS 2-3 VIEWS LEFT  Result Date: 02/23/2019 CLINICAL DATA:  Pain in the left hip since 02/12/2019. No known injury. EXAM: DG HIP (WITH OR WITHOUT PELVIS) 2-3V LEFT COMPARISON:  CT 02/09/2019 FINDINGS: No evidence of fracture or focal lesion on the left. No joint space narrowing. Chronic calcification anterior to the greater trochanter on the right relates to chronic tendinosis of  the gluteal insertion. No other pelvic finding. IMPRESSION: No abnormality seen to explain left hip pain. Electronically Signed   By: Nelson Chimes M.D.   On: 02/23/2019 10:59        Scheduled Meds: . acetaminophen  1,000 mg Oral TID   Or  . acetaminophen  650 mg Rectal TID  . amantadine  100 mg Oral Daily  . amLODipine  10 mg Oral Daily  . aspirin  81 mg Oral Daily  . carbidopa-levodopa  1 tablet Oral TID  . Carbidopa-Levodopa ER  1 tablet Oral QHS  . enoxaparin (LOVENOX) injection  40 mg Subcutaneous Q24H  . rOPINIRole  1 mg Oral TID  . rosuvastatin  5 mg Oral QPM  . senna  1 tablet Oral BID  . sertraline  100 mg Oral Daily   Continuous Infusions: . sodium chloride 100 mL/hr at 02/23/19 1025     LOS: 1 day    Time spent: 35 minutes,     Gracyn Santillanes A Keath Matera, MD [facility]  If 7PM-7AM, please contact night-coverage [email or web address]  02/23/2019, 1:24 PM

## 2019-02-23 NOTE — Consult Note (Signed)
NAME:  Destiny Roth, MRN:  [id number], DOB:  08/22/1950, LOS: 1 ADMISSION DATE:  02/22/2019, CONSULTATION DATE:  02/23/19 REFERRING MD:  Dr. Niel Hummer, MD CHIEF COMPLAINT:  Hypercalcemia  Brief History   69 year old female admitted for hypercalcemia of unknown etiology. PCCM consulted for bronchoscopic evaluation to rule out sarcoid.  History of present illness   69 year old female with the below medical history who presented with poor PO intake, progressive weakness and fatigue and muscle pain and admitted after being found with hypercalcemia in her Endocrinology office. Etiology of her hypercalcemia remains unclear with outpatient work-up. She was started on IVF and Zometa. Oncology and PCCM consulted for recommendations regarding hypercalcemia work-up.  Past Medical History  Parkinson's disease, HTN, HLD, restrictive lung disease  Significant Hospital Events     Consults:  PCCM Oncology Endrocrine  Procedures:    Significant Diagnostic Tests:  CXR 02/23/19 - interval resolutions of bilateral pleural effusions  CT Chest 02/09/19 - Bilateral pleural effusions. Mild ground glass opacifications bilaterally. No enlarged mediastinal or hilar adenopathy.  Micro Data:  N/A  Antimicrobials:  N/A  Interim history/subjective:  As above  Objective   Blood pressure 138/75, pulse 76, temperature 98.1 F (36.7 C), temperature source Oral, resp. rate 16, SpO2 97 %.        Intake/Output Summary (Last 24 hours) at 02/23/2019 2115 Last data filed at 02/23/2019 2107 Gross per 24 hour  Intake 2693.97 ml  Output 850 ml  Net 1843.97 ml   There were no vitals filed for this visit.  Physical Exam: General: Well-appearing, no acute distress HENT: [place], AT, OP clear, MMM Eyes: EOMI, no scleral icterus Respiratory: Clear to auscultation bilaterally.  No crackles, wheezing or rales Cardiovascular: RRR, -M/R/G, no JVD Extremities:-Edema,-tenderness Neuro: AAO x4, CNII-XII  grossly intact Skin: Intact, no rashes or bruising Psych: Normal mood, normal affect  Assessment & Plan:   Hypercalcemia - Extensive work-up unrevealing so far including no evidence of malignancy, hyperthyroidism, multiple myeloma. Oncology is following and planning bone scan for occult malignancy +/- bone marrow biopsy. PCCM consulted to evaluate for sarcoidosis as a potential cause for her hypercalcemia. CT Chest reviewed and no evidence of adenopathy or any other abnormalities to suggest sarcoid. Patient does have mild restrictive lung disease seen on 2015 PFT thought to be related to her deconditioning in the setting of Parkinson's disease. Her overall current presentation is not convincing for sarcoid though if she develops more profound pulmonary symptoms or chest imaging changes in the future, could consider additional work-up at that time. If bone marrow biopsy demonstrates granulomatous findings, could consider trial of prednisone therapy. In the interim, would recommend IVF, low calcium diet and deferring to Endocrine for hypercalcemia management.  Recommendations No indication for bronchoscopic evaluation in the absence of pulmonary disease. Discussed with patient and risks of procedure outweigh benefits at this time.  Reasonable to obtain CT Chest in 6-8 weeks as an outpatient with PCP. If abnormal, can refer to outpatient pulmonary to reconsider bronchoscopy with biopsy  Thank you for involving [place] in the care of your patient. Pulmonary available as needed.  Labs   CBC: Recent Labs  Lab 02/22/19 1933  WBC 5.8  NEUTROABS 4.2  HGB 12.1  HCT 36.2  MCV 88.1  PLT 496    Basic Metabolic Panel: Recent Labs  Lab 02/22/19 1933 02/23/19 0150  NA 136 134*  K 3.0* 3.1*  CL 102 102  CO2 24 23  GLUCOSE 151* 100*  BUN 21 20  CREATININE 1.95* 1.75*  CALCIUM 11.2* 11.5*  MG 1.7  --    GFR: CrCl cannot be calculated (Unknown ideal weight.). Recent Labs  Lab 02/22/19 1933    WBC 5.8    Liver Function Tests: Recent Labs  Lab 02/22/19 1933  AST 24  ALT 6  ALKPHOS 68  BILITOT 0.8  PROT 6.5  ALBUMIN 2.9*   No results for input(s): LIPASE, AMYLASE in the last 168 hours. No results for input(s): AMMONIA in the last 168 hours.  ABG    Component Value Date/Time   PHART 7.427 11/09/2013 1337   PCO2ART 38.8 11/09/2013 1337   PO2ART 71.0 (L) 11/09/2013 1337   HCO3 25.6 (H) 11/09/2013 1337   TCO2 27 11/09/2013 1337   O2SAT 95.0 11/09/2013 1337     Coagulation Profile: No results for input(s): INR, PROTIME in the last 168 hours.  Cardiac Enzymes: No results for input(s): CKTOTAL, CKMB, CKMBINDEX, TROPONINI in the last 168 hours.  HbA1C: Hgb A1c MFr Bld  Date/Time Value Ref Range Status  07/07/2014 12:25 PM 5.3 4.6 - 6.5 % Final    Comment:    Glycemic Control Guidelines for People with Diabetes:Non Diabetic:  <6%Goal of Therapy: <7%Additional Action Suggested:  >8%   10/08/2013 01:25 PM 5.4 4.6 - 6.5 % Final    Comment:    Glycemic Control Guidelines for People with Diabetes:Non Diabetic:  <6%Goal of Therapy: <7%Additional Action Suggested:  >8%     CBG: No results for input(s): GLUCAP in the last 168 hours.  Review of Systems:   Review of Systems  Constitutional: Positive for malaise/fatigue. Negative for chills, diaphoresis, fever and weight loss.       Decreased appetitie   HENT: Negative for congestion, ear pain and sore throat.   Respiratory: Negative for cough, hemoptysis, sputum production, shortness of breath and wheezing.   Cardiovascular: Negative for chest pain, palpitations and leg swelling.  Gastrointestinal: Negative for abdominal pain, heartburn and nausea.  Genitourinary: Negative for frequency.  Musculoskeletal: Negative for joint pain and myalgias.  Skin: Negative for itching and rash.  Neurological: Positive for weakness. Negative for dizziness and headaches.  Endo/Heme/Allergies: Does not bruise/bleed easily.   Psychiatric/Behavioral: Negative for depression. The patient is not nervous/anxious.      Past Medical History  She,  has a past medical history of Abdominal pain, epigastric (09/18/2009), Anosmia (11/06/2010), ANXIETY (09/23/2006), BUNION, RIGHT FOOT (09/18/2009), CARPAL TUNNEL SYNDROME, BILATERAL (09/23/2006), DEPRESSION (09/23/2006), DIVERTICULOSIS, COLON (09/23/2006), GERD (05/24/2008), GLUCOSE INTOLERANCE (05/24/2008), Hemorrhoids, Hypercalcemia, HYPERLIPIDEMIA (09/23/2006), HYPERTENSION (09/23/2006), IBS (09/23/2006), Impaired glucose tolerance (11/04/2010), OSTEOPOROSIS (05/24/2008), Parkinson's disease ([place]), and Restrictive lung disease (12/20/2013).   Surgical History    Past Surgical History:  Procedure Laterality Date  . CARDIAC CATHETERIZATION    . CATARACT EXTRACTION    . COLONOSCOPY W/ BIOPSIES  2008   Dr. Collene Mares -negative random bxs  . ESOPHAGOGASTRODUODENOSCOPY  2008   Dr. Harriette Bouillon duodenal ulcer  . FOOT SURGERY Bilateral 2015  . LEFT AND RIGHT HEART CATHETERIZATION WITH CORONARY ANGIOGRAM N/A 11/09/2013   Procedure: LEFT AND RIGHT HEART CATHETERIZATION WITH CORONARY ANGIOGRAM;  Surgeon: Peter M Martinique, MD;  Location: [facility] CATH LAB;  Service: Cardiovascular;  Laterality: N/A;  . nasal septum repair    . s/p bilat CTS    . s/p fibroid ablation    . s/p ganglion cyst right wrist    . s/p right thumb trigger finger surgury    . SHOULDER SURGERY Right 2015  . TUBAL  LIGATION       Social History   reports that she has never smoked. She has never used smokeless tobacco. She reports current alcohol use. She reports that she does not use drugs.   Family History   Her family history includes Heart attack in her brother and mother; Heart disease (age of onset: 62) in her father; Hypertension in her brother, brother, brother, and father; Peripheral vascular disease in her mother.   Allergies Allergies  Allergen Reactions  . Myrbetriq [Mirabegron] Other (See Comments)    SEVERE muscle  and joint pain  . Lipitor [Atorvastatin] Other (See Comments)    Muscle pain  . Zocor [Simvastatin] Other (See Comments)    Muscle pain     Home Medications  Prior to Admission medications   Medication Sig Start Date End Date Taking? Authorizing Provider  acetaminophen (TYLENOL) 500 MG tablet Take 1,000 mg by mouth every 6 (six) hours as needed for mild pain.   Yes [provider]  Amantadine HCl 100 MG tablet Take 1 tablet (100 mg total) by mouth daily. 02/11/19  Yes Nita Sells, MD  amLODipine (NORVASC) 10 MG tablet Take 1 tablet (10 mg total) by mouth daily. 02/12/19  Yes Nita Sells, MD  aspirin 81 MG tablet Take 81 mg by mouth daily.   Yes [provider]  carbidopa-levodopa (SINEMET IR) 25-100 MG tablet TAKE ONE TABLET BY MOUTH THREE TIMES DAILY Patient taking differently: Take 1 tablet by mouth See admin instructions. Take 1 tablet by mouth three times a day- 8 AM, 12 PM, and between 5-6 PM 10/26/15  Yes Tat, Wells Guiles S, DO  Carbidopa-Levodopa ER (SINEMET CR) 25-100 MG tablet controlled release Take 1 tablet by mouth at bedtime. 03/07/15  Yes Tat, Eustace Quail, DO  hydrocortisone (ANUSOL-HC) 25 MG suppository Place 1 suppository (25 mg total) rectally at bedtime as needed for hemorrhoids or itching. 02/21/16  Yes Irene Shipper, MD  rOPINIRole (REQUIP) 1 MG tablet Take 1 tablet (1 mg total) by mouth 3 (three) times daily. Patient taking differently: Take 1 mg by mouth See admin instructions. Take 1 mg by mouth three times a day- 8 AM, 12 PM, and between 5-6 PM 02/11/19  Yes Nita Sells, MD  rosuvastatin (CRESTOR) 5 MG tablet Take 5 mg by mouth every evening.  01/10/19  Yes [provider]  sertraline (ZOLOFT) 100 MG tablet Take 100 mg by mouth daily. 01/10/19  Yes [provider]  furosemide (LASIX) 20 MG tablet Take 1 tablet (20 mg total) by mouth 2 (two) times daily for 2 doses. Patient not taking: Reported on 02/22/2019 02/11/19  02/22/19  Nita Sells, MD  mirabegron ER (MYRBETRIQ) 25 MG TB24 tablet Take 1 tablet (25 mg total) by mouth daily. Patient not taking: Reported on 02/22/2019 02/12/19   Nita Sells, MD   Reviewed imaging and tests above. Discussed case with Pulmonary team including Dr. Tamala Julian and Primary team, Dr. Tyrell Antonio.   Care Time: 15 min  Rodman Pickle, M.D. [facility] Pulmonary/Critical Care Medicine 02/23/2019 9:15 PM

## 2019-02-24 ENCOUNTER — Inpatient Hospital Stay (HOSPITAL_COMMUNITY): Payer: Medicare Other

## 2019-02-24 LAB — URINALYSIS, ROUTINE W REFLEX MICROSCOPIC
Bacteria, UA: NONE SEEN
Bilirubin Urine: NEGATIVE
Glucose, UA: NEGATIVE mg/dL
Ketones, ur: NEGATIVE mg/dL
Leukocytes,Ua: NEGATIVE
Nitrite: NEGATIVE
Protein, ur: NEGATIVE mg/dL
Specific Gravity, Urine: 1.006 (ref 1.005–1.030)
pH: 6 (ref 5.0–8.0)

## 2019-02-24 LAB — HEPATIC FUNCTION PANEL
ALT: 8 U/L (ref 0–44)
AST: 23 U/L (ref 15–41)
Albumin: 2.7 g/dL — ABNORMAL LOW (ref 3.5–5.0)
Alkaline Phosphatase: 65 U/L (ref 38–126)
Bilirubin, Direct: <0.1 mg/dL (ref 0.0–0.2)
Total Bilirubin: 0.2 mg/dL — ABNORMAL LOW (ref 0.3–1.2)
Total Protein: 5.9 g/dL — ABNORMAL LOW (ref 6.5–8.1)

## 2019-02-24 LAB — BASIC METABOLIC PANEL
Anion gap: 8 (ref 5–15)
BUN: 18 mg/dL (ref 8–23)
CO2: 22 mmol/L (ref 22–32)
Calcium: 11.4 mg/dL — ABNORMAL HIGH (ref 8.9–10.3)
Chloride: 104 mmol/L (ref 98–111)
Creatinine, Ser: 1.78 mg/dL — ABNORMAL HIGH (ref 0.44–1.00)
GFR calc Af Amer: 33 mL/min — ABNORMAL LOW (ref >60)
GFR calc non Af Amer: 28 mL/min — ABNORMAL LOW (ref >60)
Glucose, Bld: 102 mg/dL — ABNORMAL HIGH (ref 70–99)
Potassium: 3.8 mmol/L (ref 3.5–5.1)
Sodium: 134 mmol/L — ABNORMAL LOW (ref 135–145)

## 2019-02-24 LAB — ANGIOTENSIN CONVERTING ENZYME: Angiotensin-Converting Enzyme: 115 U/L — ABNORMAL HIGH (ref 14–82)

## 2019-02-24 LAB — CBC
HCT: 35 % — ABNORMAL LOW (ref 36.0–46.0)
Hemoglobin: 11.6 g/dL — ABNORMAL LOW (ref 12.0–15.0)
MCH: 28.9 pg (ref 26.0–34.0)
MCHC: 33.1 g/dL (ref 30.0–36.0)
MCV: 87.1 fL (ref 80.0–100.0)
Platelets: 176 10*3/uL (ref 150–400)
RBC: 4.02 MIL/uL (ref 3.87–5.11)
RDW: 13.2 % (ref 11.5–15.5)
WBC: 5.9 10*3/uL (ref 4.0–10.5)
nRBC: 0 % (ref 0.0–0.2)

## 2019-02-24 LAB — CALCIUM, IONIZED: Calcium, Ionized, Serum: 6.2 mg/dL — ABNORMAL HIGH (ref 4.5–5.6)

## 2019-02-24 MED ORDER — CARBIDOPA-LEVODOPA ER 25-100 MG PO TBCR
1.0000 | EXTENDED_RELEASE_TABLET | Freq: Two times a day (BID) | ORAL | Status: DC
  Administered 2019-02-24 – 2019-02-26 (×4): 1 via ORAL
  Filled 2019-02-24 (×6): qty 1

## 2019-02-24 MED ORDER — SODIUM CHLORIDE 0.9 % IV SOLN: INTRAVENOUS | Status: DC

## 2019-02-24 MED ORDER — TECHNETIUM TC 99M MEDRONATE IV KIT
22.0000 | PACK | Freq: Once | INTRAVENOUS | Status: AC | PRN
  Administered 2019-02-24: 22 via INTRAVENOUS

## 2019-02-24 NOTE — TOC Initial Note (Signed)
Transition of Care [place]) - Initial/Assessment Note    Patient Details  Name: Destiny Roth MRN: [id number] Date of Birth: 26-Aug-1950  Transition of Care [facility]) CM/SW Contact:    Marilu Favre, RN Phone Number: 02/24/2019, 3:38 PM  Clinical Narrative:                  Patient from home with husband. PTA active with [facility] for HHRN,PT,OT and aide. Valerie with [facility] following . Requested orders.   Patient already has walker and 3 in 1 at home  Expected Discharge Plan: [place] Barriers to Discharge: Continued Medical Work up   Patient Goals and CMS Choice Patient states their goals for this hospitalization and ongoing recovery are:: to return to home CMS Medicare.gov Compare Post Acute Care list provided to:: Patient Choice offered to / list presented to : Patient, Spouse  Expected Discharge Plan and Services Expected Discharge Plan: [place]   Discharge Planning Services: CM Consult Post Acute Care Choice: [place] arrangements for the past 2 months: Single Family Home                 DME Arranged: N/A         HH Arranged: RN, PT, OT, Nurse's Aide [place] Agency: [facility]) Date HH Agency Contacted: 02/24/19 Time HH Agency Contacted: 1537 Representative spoke with at [place]: [place] Arrangements/Services Living arrangements for the past 2 months: [place] with:: Spouse Patient language and need for interpreter reviewed:: Yes Do you feel safe going back to the place where you live?: Yes      Need for Family Participation in Patient Care: Yes (Comment) Care giver support system in place?: Yes (comment) Current home services: DME, Homehealth aide, Home OT, Home PT, Home RN Criminal Activity/Legal Involvement Pertinent to Current Situation/Hospitalization: No - Comment as needed  Activities of Daily Living      Permission Sought/Granted    Permission granted to share information with : Yes, Verbal Permission Granted  Share Information with NAME: Lau[place]ta husband           Emotional Assessment Appearance:: Appears stated age Attitude/Demeanor/Rapport: Engaged Affect (typically observed): Accepting Orientation: : Oriented to Self, Oriented to Place, Oriented to  Time, Oriented to Situation Alcohol / Substance Use: Not Applicable Psych Involvement: No (comment)  Admission diagnosis:  Hypercalcemia [E83.52] Patient Active Problem List   Diagnosis Date Noted  . Hypercalcemia 02/04/2019  . Generalized weakness 02/04/2019  . AKI (acute kidney injury) ([place]) 02/04/2019  . Vertigo 03/12/2016  . Chest congestion 02/24/2015  . Peripheral edema 07/09/2014  . Restrictive lung disease 12/20/2013  . DOE (dyspnea on exertion) 11/18/2013  . Hot flashes 11/18/2013  . Chest pain 08/04/2013  . Memory impairment 08/04/2013  . Orthostasis 02/26/2013  . Tachycardia 02/24/2013  . Coronary artery calcification seen on CAT scan 01/01/2013  . Tendonitis, calcific, shoulder 01/01/2013  . OSA (obstructive sleep apnea) 01/01/2013  . PD (Parkinson's disease) ([place]) 11/26/2011  . Hematochezia 11/09/2011  . Colon polyps 11/09/2011  . IBS (irritable bowel syndrome) 11/09/2011  . Anosmia 11/06/2010  . Impaired glucose tolerance 11/04/2010  . Hearing loss 05/07/2010  . Preventative health care 05/07/2010  . BUNION, RIGHT FOOT 09/18/2009  . GERD 05/24/2008  . OSTEOPOROSIS 05/24/2008  . Hyperlipidemia 09/23/2006  . Anxiety state 09/23/2006  . Depression 09/23/2006  . CARPAL TUNNEL SYNDROME, BILATERAL 09/23/2006  . Essential hypertension  09/23/2006  . DIVERTICULOSIS, COLON 09/23/2006   PCP:  No primary care provider on file. Pharmacy:   [facility] [address] ([address]), [place] [id number] [place] ([place])  [address] Phone: [phone] Fax: [phone]  [facility] [place], [place] [address] [place] [address] Phone: [phone] Fax: [phone]  [id number] [address] [place] [address] Phone: [phone] Fax: [phone]     Social Determinants of Health (SDOH) Interventions    Readmission Risk Interventions No flowsheet data found.

## 2019-02-24 NOTE — Plan of Care (Signed)
  Problem: Education: Goal: Knowledge of General Education information will improve Description: Including pain rating scale, medication(s)/side effects and non-pharmacologic comfort measures Outcome: Progressing   Problem: Health Behavior/Discharge Planning: Goal: Ability to manage health-related needs will improve Outcome: Progressing   Problem: Clinical Measurements: Goal: Ability to maintain clinical measurements within normal limits will improve Outcome: Progressing Goal: Will remain free from infection Outcome: Progressing Goal: Diagnostic test results will improve Outcome: Progressing   Problem: Activity: Goal: Risk for activity intolerance will decrease Outcome: Progressing   Problem: Nutrition: Goal: Adequate nutrition will be maintained Outcome: Progressing   Problem: Elimination: Goal: Will not experience complications related to bowel motility Outcome: Progressing Goal: Will not experience complications related to urinary retention Outcome: Progressing   Problem: Pain Managment: Goal: General experience of comfort will improve Outcome: Progressing   Problem: Safety: Goal: Ability to remain free from injury will improve Outcome: Progressing   Problem: Skin Integrity: Goal: Risk for impaired skin integrity will decrease Outcome: Progressing   

## 2019-02-24 NOTE — Consult Note (Signed)
Referring Provider: No ref. provider found Primary Care Physician:  No primary care provider on file. Primary Nephrologist:    Reason for Consultation:   Acute kidney injury, maintenance of euvolemia, assessment and treatment of electrolyte abnormalities, assessment and treatment of acid-base disorders  HPI: This is a 69 year old lady with a history of Parkinson's disease hypertension restrictive lung disease and anxiety.  She was hospitalized for hypercalcemia in January 2021 and a thorough work-up including a CT scan was performed that was negative for malignancy.  She had an SPEP performed 02/03/2019 and a normal kappa/lambda light chain observed.  She had lab work performed in endocrinologist 02/23/2019 revealed an increase in her creatinine to 1.88 and a calcium of 12.2.  Due to the rising calcium she was admitted to [facility] and underwent administration of Zometa 4 mg IV 02/22/2019.  Baseline creatinine 0.96 02/10/2019  Blood pressure 142/68 pulse 73 temperature 98 O2 sats 97% room air  Sodium 134 potassium 3.8 chloride 104 CO2 22 BUN 18 creatinine 1.78 glucose 102 calcium 11.4 albumin 2.7 iron saturations 14%.  M spike not observed.  Vitamin D 125 level 72.6   vitamin D 25-hydroxy 64 negative M spike on serum protein electrophoresis.  PTH 6 PTH related peptide less than 2.  Alkaline phosphatase 65.  Results of CT abdomen and pelvis and chest    1. No definite evidence of lymphoma or other malignancy. 2. Moderate bilateral pleural effusions lying dependently with extensive passive atelectasis in the lower lobes of the lungs bilaterally. 3. Trace volume of ascites. 4. Gallbladder wall edema, nonspecific in the setting of small volume of ascites. No definite gallstones. Gallbladder does not appear distended, nor are there are surrounding pericholecystic inflammatory changes to suggest an acute cholecystitis at this time. 5. Aortic atherosclerosis, in addition to left main and 2  vessel coronary artery disease. Assessment for potential risk factor modification, dietary therapy or pharmacologic therapy may be warranted, if clinically indicated. 6. Colonic diverticulosis without evidence of acute diverticulitis at this time.  Appears to have had an elevated calcium since December 2015.  Amlodipine 10 mg daily, amantadine 100 mg daily, Sinemet 1 tablet 3 times daily, Sinemet CR 1 tablet at bedtime, Requip 1 mg 3 times daily, Crestor 5 mg nightly, Zoloft 100 mg daily  IV half normal saline 125 cc an hour    Past Medical History:  Diagnosis Date  . Abdominal pain, epigastric 09/18/2009  . Anosmia 11/06/2010  . ANXIETY 09/23/2006  . BUNION, RIGHT FOOT 09/18/2009  . CARPAL TUNNEL SYNDROME, BILATERAL 09/23/2006  . DEPRESSION 09/23/2006  . DIVERTICULOSIS, COLON 09/23/2006  . GERD 05/24/2008  . GLUCOSE INTOLERANCE 05/24/2008  . Hemorrhoids   . Hypercalcemia   . HYPERLIPIDEMIA 09/23/2006  . HYPERTENSION 09/23/2006  . IBS 09/23/2006  . Impaired glucose tolerance 11/04/2010  . OSTEOPOROSIS 05/24/2008  . Parkinson's disease ([place])   . Restrictive lung disease 12/20/2013    Past Surgical History:  Procedure Laterality Date  . CARDIAC CATHETERIZATION    . CATARACT EXTRACTION    . COLONOSCOPY W/ BIOPSIES  2008   Dr. Collene Mares -negative random bxs  . ESOPHAGOGASTRODUODENOSCOPY  2008   Dr. Harriette Bouillon duodenal ulcer  . FOOT SURGERY Bilateral 2015  . LEFT AND RIGHT HEART CATHETERIZATION WITH CORONARY ANGIOGRAM N/A 11/09/2013   Procedure: LEFT AND RIGHT HEART CATHETERIZATION WITH CORONARY ANGIOGRAM;  Surgeon: Peter M Martinique, MD;  Location[facility] CATH LAB;  Service: Cardiovascular;  Laterality: N/A;  . nasal septum repair    .  s/p bilat CTS    . s/p fibroid ablation    . s/p ganglion cyst right wrist    . s/p right thumb trigger finger surgury    . SHOULDER SURGERY Right 2015  . TUBAL LIGATION      Prior to Admission medications   Medication Sig Start Date End Date Taking?  Authorizing Provider  acetaminophen (TYLENOL) 500 MG tablet Take 1,000 mg by mouth every 6 (six) hours as needed for mild pain.   Yes [provider]  Amantadine HCl 100 MG tablet Take 1 tablet (100 mg total) by mouth daily. 02/11/19  Yes Nita Sells, MD  amLODipine (NORVASC) 10 MG tablet Take 1 tablet (10 mg total) by mouth daily. 02/12/19  Yes Nita Sells, MD  aspirin 81 MG tablet Take 81 mg by mouth daily.   Yes [provider]  carbidopa-levodopa (SINEMET IR) 25-100 MG tablet TAKE ONE TABLET BY MOUTH THREE TIMES DAILY Patient taking differently: Take 1 tablet by mouth See admin instructions. Take 1 tablet by mouth three times a day- 8 AM, 12 PM, and between 5-6 PM 10/26/15  Yes Tat, Wells Guiles S, DO  Carbidopa-Levodopa ER (SINEMET CR) 25-100 MG tablet controlled release Take 1 tablet by mouth at bedtime. 03/07/15  Yes Tat, Eustace Quail, DO  hydrocortisone (ANUSOL-HC) 25 MG suppository Place 1 suppository (25 mg total) rectally at bedtime as needed for hemorrhoids or itching. 02/21/16  Yes Irene Shipper, MD  rOPINIRole (REQUIP) 1 MG tablet Take 1 tablet (1 mg total) by mouth 3 (three) times daily. Patient taking differently: Take 1 mg by mouth See admin instructions. Take 1 mg by mouth three times a day- 8 AM, 12 PM, and between 5-6 PM 02/11/19  Yes Nita Sells, MD  rosuvastatin (CRESTOR) 5 MG tablet Take 5 mg by mouth every evening.  01/10/19  Yes [provider]  sertraline (ZOLOFT) 100 MG tablet Take 100 mg by mouth daily. 01/10/19  Yes [provider]  furosemide (LASIX) 20 MG tablet Take 1 tablet (20 mg total) by mouth 2 (two) times daily for 2 doses. Patient not taking: Reported on 02/22/2019 02/11/19 02/22/19  Nita Sells, MD  mirabegron ER (MYRBETRIQ) 25 MG TB24 tablet Take 1 tablet (25 mg total) by mouth daily. Patient not taking: Reported on 02/22/2019 02/12/19   Nita Sells, MD    Current Facility-Administered Medications   Medication Dose Route Frequency Provider Last Rate Last Admin  . 0.9 %  sodium chloride infusion   Intravenous Continuous Regalado, Belkys A, MD      . acetaminophen (TYLENOL) tablet 1,000 mg  1,000 mg Oral TID Neena Rhymes, MD   1,000 mg at 02/24/19 [id number]   Or  . acetaminophen (TYLENOL) suppository 650 mg  650 mg Rectal TID Neena Rhymes, MD      . amantadine (SYMMETREL) capsule 100 mg  100 mg Oral Daily Neena Rhymes, MD   100 mg at 02/24/19 [id number]  . amLODipine (NORVASC) tablet 10 mg  10 mg Oral Daily Norins, Heinz Knuckles, MD   10 mg at 02/24/19 [id number]  . aspirin chewable tablet 81 mg  81 mg Oral Daily Neena Rhymes, MD   81 mg at 02/24/19 [id number]  . carbidopa-levodopa (SINEMET IR) 25-100 MG per tablet immediate release 1 tablet  1 tablet Oral TID Norins, Heinz Knuckles, MD   1 tablet at 02/24/19 0817  . Carbidopa-Levodopa ER (SINEMET CR) 25-100 MG tablet controlled release 1 tablet  1 tablet Oral QHS  Norins, Heinz Knuckles, MD   1 tablet at 02/23/19 2104  . enoxaparin (LOVENOX) injection 40 mg  40 mg Subcutaneous Q24H Norins, Heinz Knuckles, MD   40 mg at 02/23/19 1659  . rOPINIRole (REQUIP) tablet 1 mg  1 mg Oral TID Neena Rhymes, MD   1 mg at 02/24/19 0817  . rosuvastatin (CRESTOR) tablet 5 mg  5 mg Oral QPM Norins, Heinz Knuckles, MD   5 mg at 02/23/19 1700  . senna (SENOKOT) tablet 8.6 mg  1 tablet Oral BID Neena Rhymes, MD   8.6 mg at 02/24/19 [id number]  . sertraline (ZOLOFT) tablet 100 mg  100 mg Oral Daily Norins, Heinz Knuckles, MD   100 mg at 02/24/19 [id number]  . traMADol (ULTRAM) tablet 50 mg  50 mg Oral Q6H PRN Neena Rhymes, MD   50 mg at 02/24/19 [id number]    Allergies as of 02/22/2019 - Review Complete 02/22/2019  Allergen Reaction Noted  . Myrbetriq [mirabegron] Other (See Comments) 02/22/2019  . Lipitor [atorvastatin] Other (See Comments) 02/01/2015  . Zocor [simvastatin] Other (See Comments) 02/01/2015    Family History  Problem Relation Age of Onset  . Peripheral vascular disease  Mother        with bilat amputations  . Heart attack Mother   . Hypertension Father   . Heart disease Father 38  . Hypertension Brother   . Heart attack Brother   . Hypertension Brother   . Hypertension Brother     Social History   Socioeconomic History  . Marital status: Married    Spouse name: Not on file  . Number of children: 2  . Years of education: Not on file  . Highest education level: Not on file  Occupational History  . Not on file  Tobacco Use  . Smoking status: Never Smoker  . Smokeless tobacco: Never Used  Substance and Sexual Activity  . Alcohol use: Yes    Comment: Occ  . Drug use: No  . Sexual activity: Yes    Partners: Male    Birth control/protection: Post-menopausal  Other Topics Concern  . Not on file  Social History Narrative  . Not on file   Social Determinants of Health   Financial Resource Strain:   . Difficulty of Paying Living Expenses: Not on file  Food Insecurity:   . Worried About Charity fundraiser in the Last Year: Not on file  . Ran Out of Food in the Last Year: Not on file  Transportation Needs:   . Lack of Transportation (Medical): Not on file  . Lack of Transportation (Non-Medical): Not on file  Physical Activity:   . Days of Exercise per Week: Not on file  . Minutes of Exercise per Session: Not on file  Stress:   . Feeling of Stress : Not on file  Social Connections:   . Frequency of Communication with Friends and Family: Not on file  . Frequency of Social Gatherings with Friends and Family: Not on file  . Attends Religious Services: Not on file  . Active Member of Clubs or Organizations: Not on file  . Attends Archivist Meetings: Not on file  . Marital Status: Not on file  Intimate Partner Violence:   . Fear of Current or Ex-Partner: Not on file  . Emotionally Abused: Not on file  . Physically Abused: Not on file  . Sexually Abused: Not on file    Review of Systems: Gen: Denies any fever, chills,  sweats, anorexia, fatigue, weakness, malaise, weight loss, and sleep disorder HEENT: No visual complaints, No history of Retinopathy. Normal external appearance No Epistaxis or Sore throat. No sinusitis.   CV: Denies chest pain, angina, palpitations, syncope, orthopnea, PND, peripheral edema, and claudication. Resp: Denies dyspnea at rest, dyspnea with exercise, cough, sputum, wheezing, coughing up blood, and pleurisy. GI: Denies vomiting blood, jaundice, and fecal incontinence.   Denies dysphagia or odynophagia. GU : Denies urinary burning, blood in urine, urinary frequency, urinary hesitancy, nocturnal urination, and urinary incontinence.  No renal calculi. MS: Denies joint pain, limitation of movement, and swelling, stiffness, low back pain, extremity pain. Denies muscle weakness, cramps, atrophy.  No use of non steroidal antiinflammatory drugs. Derm: Denies rash, itching, dry skin, hives, moles, warts, or unhealing ulcers.  Psych: Denies depression, anxiety, memory loss, suicidal ideation, hallucinations, paranoia, and confusion. Heme: Denies bruising, bleeding, and enlarged lymph nodes. Neuro: No headache.  No diplopia. No dysarthria.  No dysphasia.  No history of CVA.  No Seizures. No paresthesias.  No weakness. Endocrine No DM.  No Thyroid disease.  No Adrenal disease.  Physical Exam: Vital signs in last 24 hours: Temp:  [98 F (36.7 C)-98.1 F (36.7 C)] 98 F (36.7 C) (01/27 0446) Pulse Rate:  [73-76] 73 (01/27 0446) Resp:  [16] 16 (01/27 0446) BP: (138-142)/(68-75) 142/68 (01/27 0446) SpO2:  [97 %] 97 % (01/27 0446) Last BM Date: 02/24/19 General:   Alert,  Well-developed, well-nourished, pleasant and cooperative in NAD Head:  Normocephalic and atraumatic. Eyes:  Sclera clear, no icterus.   Conjunctiva pink. Ears:  Normal auditory acuity. Nose:  No deformity, discharge,  or lesions. Mouth:  No deformity or lesions, dentition normal. Neck:  Supple; no masses or thyromegaly. JVP  not elevated Lungs:  Clear throughout to auscultation.   No wheezes, crackles, or rhonchi. No acute distress. Heart:  Regular rate and rhythm; no murmurs, clicks, rubs,  or gallops. Abdomen:  Soft, nontender and nondistended. No masses, hepatosplenomegaly or hernias noted. Normal bowel sounds, without guarding, and without rebound.   Msk:  Symmetrical without gross deformities. Normal posture. Pulses:  No carotid, renal, femoral bruits. DP and PT symmetrical and equal Extremities:  Without clubbing or edema. Neurologic:  Alert and  oriented x4;  grossly normal neurologically. Skin:  Intact without significant lesions or rashes. Cervical Nodes:  No significant cervical adenopathy. Psych:  Alert and cooperative. Normal mood and affect.  Intake/Output from previous day: 01/26 0701 - 01/27 0700 In: 3666.5 [P.O.:780; I.V.:2886.5] Out: 1750 [Urine:1550] Intake/Output this shift: Total I/O In: 240 [P.O.:240] Out: -   Lab Results: Recent Labs    02/22/19 1933 02/24/19 0337  WBC 5.8 5.9  HGB 12.1 11.6*  HCT 36.2 35.0*  PLT 201 176   BMET Recent Labs    02/22/19 1933 02/23/19 0150 02/24/19 0337  NA 136 134* 134*  K 3.0* 3.1* 3.8  CL 102 102 104  CO2 24 23 22   GLUCOSE 151* 100* 102*  BUN 21 20 18   CREATININE 1.95* 1.75* 1.78*  CALCIUM 11.2* 11.5* 11.4*   LFT Recent Labs    02/24/19 0337  PROT 5.9*  ALBUMIN 2.7*  AST 23  ALT 8  ALKPHOS 65  BILITOT 0.2*  BILIDIR <0.1  IBILI NOT CALCULATED   PT/INR No results for input(s): LABPROT, INR in the last 72 hours. Hepatitis Panel No results for input(s): HEPBSAG, HCVAB, HEPAIGM, HEPBIGM in the last 72 hours.  Studies/Results: DG Chest 2 View  Result Date: 02/23/2019  CLINICAL DATA:  Pleural effusion. Chest heaviness. EXAM: CHEST - 2 VIEW COMPARISON:  Chest radiographs 02/08/2019 and CT 02/09/2019 FINDINGS: The cardiomediastinal silhouette is within normal limits. Aortic atherosclerosis is noted. There is improved aeration  of the lungs bilaterally with mild residual opacity in the right lung base. Pleural effusions have greatly decreased in size, with trace pleural fluid remaining bilaterally. No edema or pneumothorax is identified. No acute osseous abnormality is seen. IMPRESSION: 1. Trace pleural effusions, greatly decreased in size from the prior CT. 2. Improved bilateral lung aeration with mild residual right basilar opacity which may represent atelectasis. Electronically Signed   By: Logan Bores M.D.   On: 02/23/2019 08:59   US RENAL  Result Date: 02/24/2019 CLINICAL DATA:  Acute kidney injury EXAM: RENAL / URINARY TRACT ULTRASOUND COMPLETE COMPARISON:  None. FINDINGS: Right Kidney: Renal measurements: 11.1 x 4.3 x 5 cm = volume: 123.2 mL . Echogenicity within normal limits. There is a 2.6 cm interpolar region cyst. No hydronephrosis visualized. Left Kidney: Renal measurements: 11.9 x 5.8 x 5 cm = volume: 177.3 mL. Echogenicity within normal limits. No mass or hydronephrosis visualized. Bladder: Appears normal for degree of bladder distention. Other: Minimal perinephric fluid. IMPRESSION: No hydronephrosis.  Minimal perinephric fluid. Electronically Signed   By: Macy Mis M.D.   On: 02/24/2019 09:50   DG HIP UNILAT WITH PELVIS 2-3 VIEWS LEFT  Result Date: 02/23/2019 CLINICAL DATA:  Pain in the left hip since 02/12/2019. No known injury. EXAM: DG HIP (WITH OR WITHOUT PELVIS) 2-3V LEFT COMPARISON:  CT 02/09/2019 FINDINGS: No evidence of fracture or focal lesion on the left. No joint space narrowing. Chronic calcification anterior to the greater trochanter on the right relates to chronic tendinosis of the gluteal insertion. No other pelvic finding. IMPRESSION: No abnormality seen to explain left hip pain. Electronically Signed   By: Nelson Chimes M.D.   On: 02/23/2019 10:59    Assessment/Plan:  Acute kidney injury.  Within increase in serum creatinine to 1.78 since discharge on 02/11/2019.  No obvious etiology  apparent.  No use of nephrotoxins.  No ACE inhibitor's no ARB use no nonsteroidal anti-inflammatory drugs no Cox 2 inhibitors.  There was the administration of IV contrast 02/09/2019.  This would seem a little strange for contrast associated nephropathy.  Fortunately patient is not oliguric.  Did receive 3 L 02/24/2019.  Renal ultrasound does not demonstrate any evidence of obstruction with no hydronephrosis.  02/24/2019.  And urinalysis with urine microscopy.  Favor diagnosis of acute tubular necrosis however differential would include acute/nephritis acute glomerulonephritis.  Hypertension/volume appears to be adequately controlled.  Has been successfully volume resuscitated.  Hypercalcemia this is a puzzling situation and agree with oncology that is worthwhile proceeding with a bone scan.  The fact that the hypercalcemia has been present since 2015 would make an occult malignancy little unusual.  However the hypercalcemia is worsening.  The does not appear to be increased uptake of supplemental vitamin D or over-the-counter vitamin D supplementation.  This use could be clandestine.  Agree with Zometa and will switch IV fluids to normal saline.  This should help promote a saline diuresis  Thank you very much for this most interesting consult   LOS: 2 Sherril Croon @TODAY @10 :13 AM

## 2019-02-24 NOTE — Progress Notes (Signed)
PT Cancellation Note  Patient Details Name: Destiny Roth MRN: [id number] DOB: 02-Sep-1950   Cancelled Treatment:    Reason Eval/Treat Not Completed: Patient at procedure or test/unavailable (nuclear medicine). PT will continue to f/u with pt acutely as available.    [place] 02/24/2019, 4:47 PM

## 2019-02-24 NOTE — Progress Notes (Signed)
PROGRESS NOTE    Destiny Roth  [id number] DOB: 06/02/1950 DOA: 02/22/2019 PCP: No primary care provider on file.   Brief Narrative: 68 year old with past medical history significant for PAD, hypertension, restrictive lung disease, anxiety, glucose intolerance was recently hospitalized for hypercalcemia and had a thorough evaluation including PTH level at 5, 24 hydroxy vitamin D 65, 1.25 hydroxyvitamin D, TSH nl.CT scan for malignancy negative, since discharge, patient saw Dr Buddy Duty and labs reveal recurrent hypercalcemia, cr 1.88 and low PTH.  Patient reports weakness, decreased oral intake, leg and low back pain which have improved off of Myrbetriq.   Assessment & Plan:   Principal Problem:   Hypercalcemia Active Problems:   Hyperlipidemia   Essential hypertension   PD (Parkinson's disease) ([place])   AKI (acute kidney injury) ([place])  1-Hypercalcemia;  Recurrent, unclear etiology.  Continue with IV fluids, increase rate.  Received Zometa time one.  PTH  Related peptide less than 2.  PTH 5, vitamin D 64. 1-25 vitamin de 72 Protein electrophoresis not observed M sipke.  24 hours calcium in urine.  Was normal;  calcium urine 10.3, calcium 24 hours was 82.  Plan to proceed with Bone scan, if negative she might need BM biopsy.  Discussed, with pulmonology no indication for bronchoscopy and  ACE level ordered.   2-AKI;  Continue with IV fluids. Increase to 125 cc ns.  Hold lasix.  Cr stable at 1.7.  Check UA, Renal US negative for obstruction.  Nephrology consulted.   Hip pain; x ray negative  HTN; hold diuretics.   PD; continue with sinemet.  Will increase to prior hospital dose of ER Sinemet to BID. And  Continue with IR TID.  Pharmacist will ontact patient provider to determine exact dose that was prescribe.   Pleural effusion, bilateral  Reduce on x ray.  Discussed with Dr Burr Medico, recommend pulmonology evaluation to evaluate for sarcoidosis.  Evaluated by  pulmonary, no indication for bronchoscopy or thoracentesis.  ACE level ordered.   Hypokalemia; replete orally.   Estimated body mass index is 28.45 kg/m as calculated from the following:   Height as of 02/04/19: 4' 8.69" (1.44 m).   Weight as of 02/04/19: 59 kg.   DVT prophylaxis: lovenox Code Status: partial code Family Communication: care discussed with patient.  Husband at bedside Disposition Plan: from home, suspect will be discharge home. Still required IV fluids for AKI and hypercalcemia.  Still undergoing work-up for hypercalcemia, still requiring IV fluids today for hypercalcemia Consultants:   Oncology    Procedures:   none  Antimicrobials:    Subjective: Still felt tired, denies shortness of breath or chest pain. I discussed extensively with patient and husband test results so far and plan of care.  Objective: Vitals:   02/23/19 0511 02/23/19 0514 02/23/19 2045 02/24/19 0446  BP: (!) 152/72 121/62 138/75 (!) 142/68  Pulse: 79 78 76 73  Resp: 18 18 16 16   Temp:   98.1 F (36.7 C) 98 F (36.7 C)  TempSrc:   Oral Oral  SpO2: 98% 99% 97% 97%    Intake/Output Summary (Last 24 hours) at 02/24/2019 1432 Last data filed at 02/24/2019 1356 Gross per 24 hour  Intake 2104.26 ml  Output 1350 ml  Net 754.26 ml   There were no vitals filed for this visit.  Examination:  General exam: NAD Respiratory system: CTA Cardiovascular system: S 1,. S 2 RRR. Gastrointestinal system: BS present, soft, nt Central nervous system: alert Extremities: no edema Skin; no  rashes   Data Reviewed: I have personally reviewed following labs and imaging studies  CBC: Recent Labs  Lab 02/22/19 1933 02/24/19 0337  WBC 5.8 5.9  NEUTROABS 4.2  --   HGB 12.1 11.6*  HCT 36.2 35.0*  MCV 88.1 87.1  PLT 201 0000000   Basic Metabolic Panel: Recent Labs  Lab 02/22/19 1933 02/23/19 0150 02/24/19 0337  NA 136 134* 134*  K 3.0* 3.1* 3.8  CL 102 102 104  CO2 24 23 22   GLUCOSE 151*  100* 102*  BUN 21 20 18   CREATININE 1.95* 1.75* 1.78*  CALCIUM 11.2* 11.5* 11.4*  MG 1.7  --   --    GFR: CrCl cannot be calculated (Unknown ideal weight.). Liver Function Tests: Recent Labs  Lab 02/22/19 1933 02/24/19 0337  AST 24 23  ALT 6 8  ALKPHOS 68 65  BILITOT 0.8 0.2*  PROT 6.5 5.9*  ALBUMIN 2.9* 2.7*   No results for input(s): LIPASE, AMYLASE in the last 168 hours. No results for input(s): AMMONIA in the last 168 hours. Coagulation Profile: No results for input(s): INR, PROTIME in the last 168 hours. Cardiac Enzymes: No results for input(s): CKTOTAL, CKMB, CKMBINDEX, TROPONINI in the last 168 hours. BNP (last 3 results) No results for input(s): PROBNP in the last 8760 hours. HbA1C: No results for input(s): HGBA1C in the last 72 hours. CBG: No results for input(s): GLUCAP in the last 168 hours. Lipid Profile: No results for input(s): CHOL, HDL, LDLCALC, TRIG, CHOLHDL, LDLDIRECT in the last 72 hours. Thyroid Function Tests: No results for input(s): TSH, T4TOTAL, FREET4, T3FREE, THYROIDAB in the last 72 hours. Anemia Panel: Recent Labs    02/23/19 0150  FERRITIN 272  TIBC 293  IRON 41   Sepsis Labs: No results for input(s): PROCALCITON, LATICACIDVEN in the last 168 hours.  No results found for this or any previous visit (from the past 240 hour(s)).       Radiology Studies: DG Chest 2 View  Result Date: 02/23/2019 CLINICAL DATA:  Pleural effusion. Chest heaviness. EXAM: CHEST - 2 VIEW COMPARISON:  Chest radiographs 02/08/2019 and CT 02/09/2019 FINDINGS: The cardiomediastinal silhouette is within normal limits. Aortic atherosclerosis is noted. There is improved aeration of the lungs bilaterally with mild residual opacity in the right lung base. Pleural effusions have greatly decreased in size, with trace pleural fluid remaining bilaterally. No edema or pneumothorax is identified. No acute osseous abnormality is seen. IMPRESSION: 1. Trace pleural effusions,  greatly decreased in size from the prior CT. 2. Improved bilateral lung aeration with mild residual right basilar opacity which may represent atelectasis. Electronically Signed   By: Logan Bores M.D.   On: 02/23/2019 08:59   US RENAL  Result Date: 02/24/2019 CLINICAL DATA:  Acute kidney injury EXAM: RENAL / URINARY TRACT ULTRASOUND COMPLETE COMPARISON:  None. FINDINGS: Right Kidney: Renal measurements: 11.1 x 4.3 x 5 cm = volume: 123.2 mL . Echogenicity within normal limits. There is a 2.6 cm interpolar region cyst. No hydronephrosis visualized. Left Kidney: Renal measurements: 11.9 x 5.8 x 5 cm = volume: 177.3 mL. Echogenicity within normal limits. No mass or hydronephrosis visualized. Bladder: Appears normal for degree of bladder distention. Other: Minimal perinephric fluid. IMPRESSION: No hydronephrosis.  Minimal perinephric fluid. Electronically Signed   By: Macy Mis M.D.   On: 02/24/2019 09:50   DG HIP UNILAT WITH PELVIS 2-3 VIEWS LEFT  Result Date: 02/23/2019 CLINICAL DATA:  Pain in the left hip since 02/12/2019. No known injury.  EXAM: DG HIP (WITH OR WITHOUT PELVIS) 2-3V LEFT COMPARISON:  CT 02/09/2019 FINDINGS: No evidence of fracture or focal lesion on the left. No joint space narrowing. Chronic calcification anterior to the greater trochanter on the right relates to chronic tendinosis of the gluteal insertion. No other pelvic finding. IMPRESSION: No abnormality seen to explain left hip pain. Electronically Signed   By: Nelson Chimes M.D.   On: 02/23/2019 10:59        Scheduled Meds: . acetaminophen  1,000 mg Oral TID   Or  . acetaminophen  650 mg Rectal TID  . amantadine  100 mg Oral Daily  . amLODipine  10 mg Oral Daily  . aspirin  81 mg Oral Daily  . carbidopa-levodopa  1 tablet Oral TID  . Carbidopa-Levodopa ER  1 tablet Oral QHS  . enoxaparin (LOVENOX) injection  40 mg Subcutaneous Q24H  . rOPINIRole  1 mg Oral TID  . rosuvastatin  5 mg Oral QPM  . senna  1 tablet Oral  BID  . sertraline  100 mg Oral Daily   Continuous Infusions: . sodium chloride 125 mL/hr at 02/24/19 1045     LOS: 2 days    Time spent: 35 minutes,     Rever Pichette A Kanin Lia, MD [facility]  If 7PM-7AM, please contact night-coverage [email or web address]  02/24/2019, 2:32 PM

## 2019-02-25 ENCOUNTER — Encounter (HOSPITAL_COMMUNITY): Payer: Self-pay | Admitting: Internal Medicine

## 2019-02-25 LAB — PROTEIN ELECTROPHORESIS, SERUM
A/G Ratio: 1 (ref 0.7–1.7)
Albumin ELP: 2.9 g/dL (ref 2.9–4.4)
Alpha-1-Globulin: 0.3 g/dL (ref 0.0–0.4)
Alpha-2-Globulin: 0.9 g/dL (ref 0.4–1.0)
Beta Globulin: 0.9 g/dL (ref 0.7–1.3)
Gamma Globulin: 0.8 g/dL (ref 0.4–1.8)
Globulin, Total: 2.9 g/dL (ref 2.2–3.9)
Total Protein ELP: 5.8 g/dL — ABNORMAL LOW (ref 6.0–8.5)

## 2019-02-25 LAB — UPEP/UIFE/LIGHT CHAINS/TP, 24-HR UR
% BETA, Urine: 19.3 %
ALPHA 1 URINE: 6.1 %
Albumin, U: 51.1 %
Alpha 2, Urine: 13.2 %
Free Kappa Lt Chains,Ur: 328.94 mg/L — ABNORMAL HIGH (ref 0.63–113.79)
Free Kappa/Lambda Ratio: 7.43 (ref 1.03–31.76)
Free Lambda Lt Chains,Ur: 44.27 mg/L — ABNORMAL HIGH (ref 0.47–11.77)
GAMMA GLOBULIN URINE: 10.3 %
Total Protein, Urine-Ur/day: 681 mg/24 hr — ABNORMAL HIGH (ref 30–150)
Total Protein, Urine: 68.1 mg/dL
Total Volume: 1000

## 2019-02-25 LAB — BASIC METABOLIC PANEL
Anion gap: 8 (ref 5–15)
BUN: 11 mg/dL (ref 8–23)
CO2: 22 mmol/L (ref 22–32)
Calcium: 10.4 mg/dL — ABNORMAL HIGH (ref 8.9–10.3)
Chloride: 105 mmol/L (ref 98–111)
Creatinine, Ser: 1.36 mg/dL — ABNORMAL HIGH (ref 0.44–1.00)
GFR calc Af Amer: 46 mL/min — ABNORMAL LOW (ref >60)
GFR calc non Af Amer: 39 mL/min — ABNORMAL LOW (ref >60)
Glucose, Bld: 129 mg/dL — ABNORMAL HIGH (ref 70–99)
Potassium: 3.2 mmol/L — ABNORMAL LOW (ref 3.5–5.1)
Sodium: 135 mmol/L (ref 135–145)

## 2019-02-25 LAB — CBC
HCT: 36.6 % (ref 36.0–46.0)
Hemoglobin: 12.2 g/dL (ref 12.0–15.0)
MCH: 28.9 pg (ref 26.0–34.0)
MCHC: 33.3 g/dL (ref 30.0–36.0)
MCV: 86.7 fL (ref 80.0–100.0)
Platelets: 196 10*3/uL (ref 150–400)
RBC: 4.22 MIL/uL (ref 3.87–5.11)
RDW: 13 % (ref 11.5–15.5)
WBC: 5.4 10*3/uL (ref 4.0–10.5)
nRBC: 0 % (ref 0.0–0.2)

## 2019-02-25 MED ORDER — BOOST / RESOURCE BREEZE PO LIQD CUSTOM
1.0000 | Freq: Three times a day (TID) | ORAL | Status: DC
  Administered 2019-02-25 – 2019-02-26 (×4): 1 via ORAL

## 2019-02-25 MED ORDER — POTASSIUM CHLORIDE CRYS ER 20 MEQ PO TBCR
20.0000 meq | EXTENDED_RELEASE_TABLET | Freq: Once | ORAL | Status: AC
  Administered 2019-02-25: 20 meq via ORAL
  Filled 2019-02-25: qty 1

## 2019-02-25 MED ORDER — ENOXAPARIN SODIUM 40 MG/0.4ML ~~LOC~~ SOLN: 40.0000 mg | SUBCUTANEOUS | Status: DC

## 2019-02-25 NOTE — Evaluation (Signed)
Physical Therapy Evaluation Patient Details Name: AKELAH MOTEN MRN: [id number] DOB: May 03, 1950 Today's Date: 02/25/2019   History of Present Illness  69 year old female readmitted with hypercalcemia after followup revealed maintained hypercalcemia. Recent admit 1/6-1/14 for same. PMHx: Parkinson's disease, restrictive lung disease, impaired glucose tolerance, IBS, HTN, HLD, diverticulosis and GERD.  Clinical Impression  Pt familiar from last admission with ability to transfer OOB to recliner today but required encouragement to mobilize stating fatigue and limiting gait. Pt with improved bil LE strength from last admission with encouragement to continue gait with assist and bil LE HEP. PT with decreased activity tolerance and independence who will benefit from acute therapy to maximize mobility and function.     Follow Up Recommendations Home health PT;Supervision - Intermittent    Equipment Recommendations  None recommended by PT    Recommendations for Other Services       Precautions / Restrictions Precautions Precautions: Fall      Mobility  Bed Mobility Overal bed mobility: Modified Independent Bed Mobility: Supine to Sit     Supine to sit: HOB elevated     General bed mobility comments: with use of rail and HOB 20 degrees no physical assist  Transfers Overall transfer level: Needs assistance   Transfers: Sit to/from Stand;Stand Pivot Transfers Sit to Stand: Min guard Stand pivot transfers: Min guard       General transfer comment: guarding for safety, pt able to pivot bed to [facility] with RW  Ambulation/Gait Ambulation/Gait assistance: Min guard Gait Distance (Feet): 20 Feet Assistive device: Rolling walker (2 wheeled) Gait Pattern/deviations: Step-through pattern;Decreased stride length   Gait velocity interpretation: 1.31 - 2.62 ft/sec, indicative of limited community ambulator General Gait Details: pt with good use of RW and able to walk in room but  denied further distance due to fatigue  Stairs            Wheelchair Mobility    Modified Rankin (Stroke Patients Only)       Balance Overall balance assessment: History of Falls                                           Pertinent Vitals/Pain Pain Assessment: No/denies pain    Home Living Family/patient expects to be discharged to:: Private residence Living Arrangements: Spouse/significant other Available Help at Discharge: Family;Available 24 hours/day Type of Home: House Home Access: Stairs to enter Entrance Stairs-Rails: Right Entrance Stairs-Number of Steps: 3 Home Layout: Two level;Able to live on main level with bedroom/bathroom Home Equipment: Gilford Rile - 2 wheels      Prior Function Level of Independence: Needs assistance   Gait / Transfers Assistance Needed: using RW for mobility  ADL's / Homemaking Assistance Needed: spouse assisting with ADLs and performing homemaking for last several weeks since initial issue with hypercalcemia, wearing Depends now        Hand Dominance        Extremity/Trunk Assessment   Upper Extremity Assessment Upper Extremity Assessment: Overall WFL for tasks assessed    Lower Extremity Assessment Lower Extremity Assessment: Overall WFL for tasks assessed(5/5 bil LE strength with hip flexion/ knee flexion/knee extension)    Cervical / Trunk Assessment Cervical / Trunk Assessment: Kyphotic  Communication   Communication: No difficulties  Cognition Arousal/Alertness: Awake/alert Behavior During Therapy: Flat affect Overall Cognitive Status: Within Functional Limits for tasks assessed  General Comments: pt oriented to day not date and able to provide accurate information regarding sequence of events with function. she recalls this therapist from prior admission. Reports fatigue limiting function      General Comments      Exercises General Exercises -  Lower Extremity Long Arc Quad: AROM;Both;10 reps;Seated Hip Flexion/Marching: AROM;Both;Seated;10 reps   Assessment/Plan    PT Assessment Patient needs continued PT services  PT Problem List Decreased mobility;Decreased activity tolerance;Decreased balance;Decreased knowledge of use of DME;Decreased coordination       PT Treatment Interventions Gait training;Balance training;Stair training;Functional mobility training;Therapeutic activities;Patient/family education;Cognitive remediation;DME instruction;Therapeutic exercise    PT Goals (Current goals can be found in the Care Plan section)  Acute Rehab PT Goals Patient Stated Goal: return home PT Goal Formulation: With patient Time For Goal Achievement: 03/11/19 Potential to Achieve Goals: Good    Frequency Min 3X/week   Barriers to discharge        Co-evaluation               AM-PAC PT "6 Clicks" Mobility  Outcome Measure Help needed turning from your back to your side while in a flat bed without using bedrails?: None Help needed moving from lying on your back to sitting on the side of a flat bed without using bedrails?: A Little Help needed moving to and from a bed to a chair (including a wheelchair)?: A Little Help needed standing up from a chair using your arms (e.g., wheelchair or bedside chair)?: A Little Help needed to walk in hospital room?: A Little Help needed climbing 3-5 steps with a railing? : A Little 6 Click Score: 19    End of Session   Activity Tolerance: Patient tolerated treatment well Patient left: in chair;with call bell/phone within reach;with chair alarm set Nurse Communication: Mobility status PT Visit Diagnosis: Other abnormalities of gait and mobility (R26.89)    Time: [id number] PT Time Calculation (min) (ACUTE ONLY): 19 min   Charges:   PT Evaluation $PT Eval Moderate Complexity: 1 Mod          Iwao Shamblin P, PT Acute Rehabilitation Services Pager: [phone] Office:  Jauca B Lerone Onder 02/25/2019, 8:55 AM

## 2019-02-25 NOTE — Plan of Care (Signed)
  Problem: Education: Goal: Knowledge of General Education information will improve Description Including pain rating scale, medication(s)/side effects and non-pharmacologic comfort measures Outcome: Progressing   

## 2019-02-25 NOTE — Consult Note (Signed)
Chief Complaint: Patient was seen in consultation today for Bone Marrow biopsy at the request of Dr Tyrell Antonio  Supervising Physician: Markus Daft  Patient Status: [facility] - In-pt  History of Present Illness: Destiny Roth is a 69 y.o. female   Hx PAD; HTN; Restr lung dz Parkinson's dz  Hospitalized for Hypercalcemia Work up showing No malignancy Bone scan neg for malig or MM ACE sl elevated  Dr Burr Medico consulted per notes Requesting Bone marrow bx   Past Medical History:  Diagnosis Date  . Abdominal pain, epigastric 09/18/2009  . Anosmia 11/06/2010  . ANXIETY 09/23/2006  . BUNION, RIGHT FOOT 09/18/2009  . CARPAL TUNNEL SYNDROME, BILATERAL 09/23/2006  . DEPRESSION 09/23/2006  . DIVERTICULOSIS, COLON 09/23/2006  . GERD 05/24/2008  . GLUCOSE INTOLERANCE 05/24/2008  . Hemorrhoids   . Hypercalcemia   . HYPERLIPIDEMIA 09/23/2006  . HYPERTENSION 09/23/2006  . IBS 09/23/2006  . Impaired glucose tolerance 11/04/2010  . OSTEOPOROSIS 05/24/2008  . Parkinson's disease ([place])   . Restrictive lung disease 12/20/2013    Past Surgical History:  Procedure Laterality Date  . CARDIAC CATHETERIZATION    . CATARACT EXTRACTION    . COLONOSCOPY W/ BIOPSIES  2008   Dr. Collene Mares -negative random bxs  . ESOPHAGOGASTRODUODENOSCOPY  2008   Dr. Harriette Bouillon duodenal ulcer  . FOOT SURGERY Bilateral 2015  . LEFT AND RIGHT HEART CATHETERIZATION WITH CORONARY ANGIOGRAM N/A 11/09/2013   Procedure: LEFT AND RIGHT HEART CATHETERIZATION WITH CORONARY ANGIOGRAM;  Surgeon: Peter M Martinique, MD;  Location: [facility] CATH LAB;  Service: Cardiovascular;  Laterality: N/A;  . nasal septum repair    . s/p bilat CTS    . s/p fibroid ablation    . s/p ganglion cyst right wrist    . s/p right thumb trigger finger surgury    . SHOULDER SURGERY Right 2015  . TUBAL LIGATION      Allergies: Myrbetriq [mirabegron], Lipitor [atorvastatin], and Zocor [simvastatin]  Medications: Prior to Admission medications   Medication Sig  Start Date End Date Taking? Authorizing Provider  acetaminophen (TYLENOL) 500 MG tablet Take 1,000 mg by mouth every 6 (six) hours as needed for mild pain.   Yes [provider]  Amantadine HCl 100 MG tablet Take 1 tablet (100 mg total) by mouth daily. 02/11/19  Yes Nita Sells, MD  amLODipine (NORVASC) 10 MG tablet Take 1 tablet (10 mg total) by mouth daily. 02/12/19  Yes Nita Sells, MD  aspirin 81 MG tablet Take 81 mg by mouth daily.   Yes [provider]  carbidopa-levodopa (SINEMET IR) 25-100 MG tablet TAKE ONE TABLET BY MOUTH THREE TIMES DAILY Patient taking differently: Take 1 tablet by mouth See admin instructions. Take 1 tablet by mouth three times a day- 8 AM, 12 PM, and between 5-6 PM 10/26/15  Yes Tat, Wells Guiles S, DO  Carbidopa-Levodopa ER (SINEMET CR) 25-100 MG tablet controlled release Take 1 tablet by mouth at bedtime. 03/07/15  Yes Tat, Eustace Quail, DO  hydrocortisone (ANUSOL-HC) 25 MG suppository Place 1 suppository (25 mg total) rectally at bedtime as needed for hemorrhoids or itching. 02/21/16  Yes Irene Shipper, MD  rOPINIRole (REQUIP) 1 MG tablet Take 1 tablet (1 mg total) by mouth 3 (three) times daily. Patient taking differently: Take 1 mg by mouth See admin instructions. Take 1 mg by mouth three times a day- 8 AM, 12 PM, and between 5-6 PM 02/11/19  Yes Nita Sells, MD  rosuvastatin (CRESTOR) 5 MG tablet Take 5 mg by  mouth every evening.  01/10/19  Yes [provider]  sertraline (ZOLOFT) 100 MG tablet Take 100 mg by mouth daily. 01/10/19  Yes [provider]  furosemide (LASIX) 20 MG tablet Take 1 tablet (20 mg total) by mouth 2 (two) times daily for 2 doses. Patient not taking: Reported on 02/22/2019 02/11/19 02/22/19  Nita Sells, MD  mirabegron ER (MYRBETRIQ) 25 MG TB24 tablet Take 1 tablet (25 mg total) by mouth daily. Patient not taking: Reported on 02/22/2019 02/12/19   Nita Sells, MD     Family  History  Problem Relation Age of Onset  . Peripheral vascular disease Mother        with bilat amputations  . Heart attack Mother   . Hypertension Father   . Heart disease Father 83  . Hypertension Brother   . Heart attack Brother   . Hypertension Brother   . Hypertension Brother     Social History   Socioeconomic History  . Marital status: Married    Spouse name: Not on file  . Number of children: 2  . Years of education: Not on file  . Highest education level: Not on file  Occupational History  . Not on file  Tobacco Use  . Smoking status: Never Smoker  . Smokeless tobacco: Never Used  Substance and Sexual Activity  . Alcohol use: Yes    Comment: Occ  . Drug use: No  . Sexual activity: Yes    Partners: Male    Birth control/protection: Post-menopausal  Other Topics Concern  . Not on file  Social History Narrative  . Not on file   Social Determinants of Health   Financial Resource Strain:   . Difficulty of Paying Living Expenses: Not on file  Food Insecurity:   . Worried About Charity fundraiser in the Last Year: Not on file  . Ran Out of Food in the Last Year: Not on file  Transportation Needs:   . Lack of Transportation (Medical): Not on file  . Lack of Transportation (Non-Medical): Not on file  Physical Activity:   . Days of Exercise per Week: Not on file  . Minutes of Exercise per Session: Not on file  Stress:   . Feeling of Stress : Not on file  Social Connections:   . Frequency of Communication with Friends and Family: Not on file  . Frequency of Social Gatherings with Friends and Family: Not on file  . Attends Religious Services: Not on file  . Active Member of Clubs or Organizations: Not on file  . Attends Archivist Meetings: Not on file  . Marital Status: Not on file    Review of Systems: A 12 point ROS discussed and pertinent positives are indicated in the HPI above.  All other systems are negative.  Review of Systems    Constitutional: Negative for activity change, fatigue and fever.  HENT: Negative for sore throat.   Eyes: Negative for visual disturbance.  Respiratory: Negative for cough and shortness of breath.   Cardiovascular: Negative for chest pain.  Gastrointestinal: Negative for nausea and vomiting.  Musculoskeletal: Negative for back pain.  Neurological: Negative for weakness.  Psychiatric/Behavioral: Negative for behavioral problems and confusion.    Vital Signs: BP 140/70 (BP Location: Left Arm)   Pulse 84   Temp 97.7 F (36.5 C) (Oral)   Resp 20   SpO2 98%   Physical Exam Vitals reviewed.  Cardiovascular:     Rate and Rhythm: Normal rate and  regular rhythm.     Heart sounds: Normal heart sounds.  Pulmonary:     Breath sounds: Normal breath sounds.  Abdominal:     Palpations: Abdomen is soft.  Musculoskeletal:        General: Normal range of motion.  Skin:    General: Skin is warm and dry.  Neurological:     Mental Status: She is alert and oriented to person, place, and time.  Psychiatric:        Thought Content: Thought content normal.        Judgment: Judgment normal.     Imaging: DG Chest 2 View  Result Date: 02/23/2019 CLINICAL DATA:  Pleural effusion. Chest heaviness. EXAM: CHEST - 2 VIEW COMPARISON:  Chest radiographs 02/08/2019 and CT 02/09/2019 FINDINGS: The cardiomediastinal silhouette is within normal limits. Aortic atherosclerosis is noted. There is improved aeration of the lungs bilaterally with mild residual opacity in the right lung base. Pleural effusions have greatly decreased in size, with trace pleural fluid remaining bilaterally. No edema or pneumothorax is identified. No acute osseous abnormality is seen. IMPRESSION: 1. Trace pleural effusions, greatly decreased in size from the prior CT. 2. Improved bilateral lung aeration with mild residual right basilar opacity which may represent atelectasis. Electronically Signed   By: Logan Bores M.D.   On:  02/23/2019 08:59   DG Chest 2 View  Result Date: 02/08/2019 CLINICAL DATA:  Shortness of breath and wheezing EXAM: CHEST - 2 VIEW COMPARISON:  February 07, 2019 FINDINGS: There is airspace opacity throughout much of the right lower lobe. There may be mild pleural effusions superimposed on the right. There is atelectatic change in the left base. Heart size and pulmonary vascularity within normal limits. No adenopathy. There is aortic atherosclerosis. No bone lesions. IMPRESSION: Right lower lobe airspace opacity consistent with pneumonia. Suspect small pleural effusions superimposed on the right. Atelectatic change left base. Heart size within normal limits. No adenopathy appreciable. Aortic Atherosclerosis (ICD10-I70.0). Electronically Signed   By: Lowella Grip III M.D.   On: 02/08/2019 08:09   CT HEAD WO CONTRAST  Result Date: 02/05/2019 CLINICAL DATA:  Altered mental status EXAM: CT HEAD WITHOUT CONTRAST TECHNIQUE: Contiguous axial images were obtained from the base of the skull through the vertex without intravenous contrast. COMPARISON:  None. FINDINGS: Brain: Mild atrophic changes are noted. No findings to suggest acute hemorrhage, acute infarction or space-occupying mass lesion are noted. Vascular: No hyperdense vessel or unexpected calcification. Skull: Normal. Negative for fracture or focal lesion. Sinuses/Orbits: No acute finding. Other: None. IMPRESSION: Mild atrophic changes without acute abnormality. Electronically Signed   By: Inez Catalina M.D.   On: 02/05/2019 19:16   CT CHEST W CONTRAST  Result Date: 02/09/2019 CLINICAL DATA:  69 year old female with history of progressive weakness. Hypercalcemia. Evaluate for potential malignancy such as lymphoma. EXAM: CT CHEST, ABDOMEN, AND PELVIS WITH CONTRAST TECHNIQUE: Multidetector CT imaging of the chest, abdomen and pelvis was performed following the standard protocol during bolus administration of intravenous contrast. CONTRAST:  121m OMNIPAQUE  IOHEXOL 300 MG/ML  SOLN COMPARISON:  Chest CT 01/07/2014. CT the abdomen and pelvis 11/24/2012. FINDINGS: CT CHEST FINDINGS Cardiovascular: Heart size is normal. There is no significant pericardial fluid, thickening or pericardial calcification. There is aortic atherosclerosis, as well as atherosclerosis of the great vessels of the mediastinum and the coronary arteries, including calcified atherosclerotic plaque in the left main, left anterior descending and right coronary arteries. Mediastinum/Nodes: No pathologically enlarged mediastinal or hilar lymph nodes. Esophagus is unremarkable  in appearance. No axillary lymphadenopathy. Lungs/Pleura: Moderate bilateral pleural effusions lying dependently with areas of passive atelectasis in the lower lobes of the lungs bilaterally. Patchy areas of very mild ground-glass attenuation are noted in the lungs bilaterally, nonspecific. No definite consolidative airspace disease. No suspicious appearing pulmonary nodules or masses are noted. Musculoskeletal: There are no aggressive appearing lytic or blastic lesions noted in the visualized portions of the skeleton. CT ABDOMEN PELVIS FINDINGS Hepatobiliary: No suspicious cystic or solid hepatic lesions. No intra or extrahepatic biliary ductal dilatation. Diffuse gallbladder wall edema. No calcified gallstones. No overt pericholecystic inflammatory changes. Pancreas: No pancreatic mass. No pancreatic ductal dilatation. No pancreatic or peripancreatic fluid collections or inflammatory changes. Spleen: Unremarkable. Adrenals/Urinary Tract: Spleen measures up to 12.9 x 4.6 x 11.5 cm (estimated splenic volume of 341 mL), which is upper limits of normal, and is unremarkable in appearance. Stomach/Bowel: Normal appearance of the stomach. No pathologic dilatation of small bowel or colon. A few scattered colonic diverticulae are noted, particularly in the sigmoid colon, without surrounding inflammatory changes to suggest an acute  diverticulitis at this time. Normal appendix. Vascular/Lymphatic: Aortic atherosclerosis, without evidence of aneurysm or dissection in the abdominal or pelvic vasculature. No lymphadenopathy noted in the abdomen or pelvis. Reproductive: Uterus and ovaries are unremarkable in appearance. Other: Small volume of ascites, most evident adjacent to the liver. No pneumoperitoneum. Musculoskeletal: There are no aggressive appearing lytic or blastic lesions noted in the visualized portions of the skeleton. IMPRESSION: 1. No definite evidence of lymphoma or other malignancy. 2. Moderate bilateral pleural effusions lying dependently with extensive passive atelectasis in the lower lobes of the lungs bilaterally. 3. Trace volume of ascites. 4. Gallbladder wall edema, nonspecific in the setting of small volume of ascites. No definite gallstones. Gallbladder does not appear distended, nor are there are surrounding pericholecystic inflammatory changes to suggest an acute cholecystitis at this time. 5. Aortic atherosclerosis, in addition to left main and 2 vessel coronary artery disease. Assessment for potential risk factor modification, dietary therapy or pharmacologic therapy may be warranted, if clinically indicated. 6. Colonic diverticulosis without evidence of acute diverticulitis at this time. Electronically Signed   By: Vinnie Langton M.D.   On: 02/09/2019 17:50   NM Bone Scan Whole Body  Result Date: 02/24/2019 CLINICAL DATA:  Progressive renal insufficiency and hypercalcemia. Concern for occult neoplasm. EXAM: NUCLEAR MEDICINE WHOLE BODY BONE SCAN TECHNIQUE: Whole body anterior and posterior images were obtained approximately 3 hours after intravenous injection of radiopharmaceutical. RADIOPHARMACEUTICALS:  22.0 mCi Technetium-6mMDP IV COMPARISON:  CTs of the chest, abdomen and pelvis 02/09/2019. CT head 02/05/2019. FINDINGS: There is no osseous uptake suspicious for metastatic disease or primary osseous  malignancy. Nasal activity is likely inflammatory without clear correlate on recent head CT. There is probable periodontal disease within the maxilla. Scattered arthropathic activity is present in the shoulders, elbows, wrists, knees, ankles and 1st MTP joints. Soft tissue activity in the right forearm is attributed to the injection. Mild urine contamination in the perineal region. The soft tissue activity is otherwise unremarkable. IMPRESSION: 1. No evidence of osseous metastatic disease or primary bone malignancy. 2. Scattered mild arthropathic activity. Electronically Signed   By: WRichardean SaleM.D.   On: 02/24/2019 17:02   CT ABDOMEN PELVIS W CONTRAST  Result Date: 02/09/2019 CLINICAL DATA:  [age]year old female with history of progressive weakness. Hypercalcemia. Evaluate for potential malignancy such as lymphoma. EXAM: CT CHEST, ABDOMEN, AND PELVIS WITH CONTRAST TECHNIQUE: Multidetector CT imaging of the chest, abdomen  and pelvis was performed following the standard protocol during bolus administration of intravenous contrast. CONTRAST:  111m OMNIPAQUE IOHEXOL 300 MG/ML  SOLN COMPARISON:  Chest CT 01/07/2014. CT the abdomen and pelvis 11/24/2012. FINDINGS: CT CHEST FINDINGS Cardiovascular: Heart size is normal. There is no significant pericardial fluid, thickening or pericardial calcification. There is aortic atherosclerosis, as well as atherosclerosis of the great vessels of the mediastinum and the coronary arteries, including calcified atherosclerotic plaque in the left main, left anterior descending and right coronary arteries. Mediastinum/Nodes: No pathologically enlarged mediastinal or hilar lymph nodes. Esophagus is unremarkable in appearance. No axillary lymphadenopathy. Lungs/Pleura: Moderate bilateral pleural effusions lying dependently with areas of passive atelectasis in the lower lobes of the lungs bilaterally. Patchy areas of very mild ground-glass attenuation are noted in the lungs  bilaterally, nonspecific. No definite consolidative airspace disease. No suspicious appearing pulmonary nodules or masses are noted. Musculoskeletal: There are no aggressive appearing lytic or blastic lesions noted in the visualized portions of the skeleton. CT ABDOMEN PELVIS FINDINGS Hepatobiliary: No suspicious cystic or solid hepatic lesions. No intra or extrahepatic biliary ductal dilatation. Diffuse gallbladder wall edema. No calcified gallstones. No overt pericholecystic inflammatory changes. Pancreas: No pancreatic mass. No pancreatic ductal dilatation. No pancreatic or peripancreatic fluid collections or inflammatory changes. Spleen: Unremarkable. Adrenals/Urinary Tract: Spleen measures up to 12.9 x 4.6 x 11.5 cm (estimated splenic volume of 341 mL), which is upper limits of normal, and is unremarkable in appearance. Stomach/Bowel: Normal appearance of the stomach. No pathologic dilatation of small bowel or colon. A few scattered colonic diverticulae are noted, particularly in the sigmoid colon, without surrounding inflammatory changes to suggest an acute diverticulitis at this time. Normal appendix. Vascular/Lymphatic: Aortic atherosclerosis, without evidence of aneurysm or dissection in the abdominal or pelvic vasculature. No lymphadenopathy noted in the abdomen or pelvis. Reproductive: Uterus and ovaries are unremarkable in appearance. Other: Small volume of ascites, most evident adjacent to the liver. No pneumoperitoneum. Musculoskeletal: There are no aggressive appearing lytic or blastic lesions noted in the visualized portions of the skeleton. IMPRESSION: 1. No definite evidence of lymphoma or other malignancy. 2. Moderate bilateral pleural effusions lying dependently with extensive passive atelectasis in the lower lobes of the lungs bilaterally. 3. Trace volume of ascites. 4. Gallbladder wall edema, nonspecific in the setting of small volume of ascites. No definite gallstones. Gallbladder does not  appear distended, nor are there are surrounding pericholecystic inflammatory changes to suggest an acute cholecystitis at this time. 5. Aortic atherosclerosis, in addition to left main and 2 vessel coronary artery disease. Assessment for potential risk factor modification, dietary therapy or pharmacologic therapy may be warranted, if clinically indicated. 6. Colonic diverticulosis without evidence of acute diverticulitis at this time. Electronically Signed   By: DVinnie LangtonM.D.   On: 02/09/2019 17:50   [place]RENAL  Result Date: 02/24/2019 CLINICAL DATA:  Acute kidney injury EXAM: RENAL / URINARY TRACT ULTRASOUND COMPLETE COMPARISON:  None. FINDINGS: Right Kidney: Renal measurements: 11.1 x 4.3 x 5 cm = volume: 123.2 mL . Echogenicity within normal limits. There is a 2.6 cm interpolar region cyst. No hydronephrosis visualized. Left Kidney: Renal measurements: 11.9 x 5.8 x 5 cm = volume: 177.3 mL. Echogenicity within normal limits. No mass or hydronephrosis visualized. Bladder: Appears normal for degree of bladder distention. Other: Minimal perinephric fluid. IMPRESSION: No hydronephrosis.  Minimal perinephric fluid. Electronically Signed   By: PMacy MisM.D.   On: 02/24/2019 09:50   DG CHEST PORT 1 VIEW  Result  Date: 02/07/2019 CLINICAL DATA:  Shortness of breath EXAM: PORTABLE CHEST 1 VIEW COMPARISON:  February 24, 2013 FINDINGS: No pneumothorax. The heart, hila, and mediastinum are normal. Left lung is clear. Haziness over the right base is identified, asymmetric to the left. IMPRESSION: Haziness over the right base, asymmetric to the left, could represent atelectasis or developing infiltrate. A PA and lateral chest x-ray may better evaluate. No other acute abnormalities identified. Electronically Signed   By: Dorise Bullion III M.D   On: 02/07/2019 12:52   DG HIP UNILAT WITH PELVIS 2-3 VIEWS LEFT  Result Date: 02/23/2019 CLINICAL DATA:  Pain in the left hip since 02/12/2019. No known injury.  EXAM: DG HIP (WITH OR WITHOUT PELVIS) 2-3V LEFT COMPARISON:  CT 02/09/2019 FINDINGS: No evidence of fracture or focal lesion on the left. No joint space narrowing. Chronic calcification anterior to the greater trochanter on the right relates to chronic tendinosis of the gluteal insertion. No other pelvic finding. IMPRESSION: No abnormality seen to explain left hip pain. Electronically Signed   By: Nelson Chimes M.D.   On: 02/23/2019 10:59    Labs:  CBC: Recent Labs    02/10/19 0405 02/22/19 1933 02/24/19 0337 02/25/19 0935  WBC 6.0 5.8 5.9 5.4  HGB 11.1* 12.1 11.6* 12.2  HCT 34.7* 36.2 35.0* 36.6  PLT 245 201 176 196    COAGS: No results for input(s): INR, APTT in the last 8760 hours.  BMP: Recent Labs    02/22/19 1933 02/23/19 0150 02/24/19 0337 02/25/19 0935  NA 136 134* 134* 135  K 3.0* 3.1* 3.8 3.2*  CL 102 102 104 105  CO2 _0 GLUCOSE 151* 100* 102* 129*  BUN _1 CALCIUM 11.2* 11.5* 11.4* 10.4*  CREATININE 1.95* 1.75* 1.78* 1.36*  GFRNONAA 25* 29* 28* 39*  GFRAA 29* 34* 33* 46*    LIVER FUNCTION TESTS: Recent Labs    02/06/19 0920 02/07/19 0310 02/10/19 0405 02/11/19 0522 02/22/19 1933 02/24/19 0337  BILITOT 0.4  --  0.6  --  0.8 0.2*  AST 71*  --  25  --  24 23  ALT 12  --  9  --  6 8  ALKPHOS 100  --  91  --  68 65  PROT 4.9*  --  5.0*  --  6.5 5.9*  ALBUMIN 2.0*   < > 1.9* 2.0* 2.9* 2.7*   < > = values in this interval not displayed.    TUMOR MARKERS: No results for input(s): AFPTM, CEA, CA199, CHROMGRNA in the last 8760 hours.  Assessment and Plan:  Hypercalcemia All work up revealing No malignancy Bone scan showing No MM Scheduled for bone marrow biopsy per Oncology Risks and benefits of Bone marrow bx was discussed with the patient and/or patient's family including, but not limited to bleeding, infection, damage to adjacent structures or low yield requiring additional tests.  All of the questions were answered and there  is agreement to proceed.  Consent signed and in chart.   Thank you for this interesting consult.  I greatly enjoyed meeting [name] A Blahnik and look forward to participating in their care.  A copy of this report was sent to the requesting provider on this date.  Electronically Signed: Lavonia Drafts, PA-C 02/25/2019, 2:54 PM   I spent a total of 20 Minutes    in face to face in clinical consultation, greater than 50% of which was counseling/coordinating care for Bone marrow  bx

## 2019-02-25 NOTE — Progress Notes (Addendum)
HEMATOLOGY-ONCOLOGY PROGRESS NOTE  SUBJECTIVE: Still weak and tired.  Still having left thigh pain.  She has no other complaints today.  REVIEW OF SYSTEMS:   Noncontributory except as noted in the HPI.  I have reviewed the past medical history, past surgical history, social history and family history with the patient and they are unchanged from previous note.   PHYSICAL EXAMINATION:  Vitals:   02/25/19 0445 02/25/19 1400  BP: 136/73 140/70  Pulse: 75 84  Resp: 20   Temp: 97.6 F (36.4 C) 97.7 F (36.5 C)  SpO2: 98% 98%   There were no vitals filed for this visit.  Intake/Output from previous day: 01/27 0701 - 01/28 0700 In: 1200.8 [P.O.:240; I.V.:960.8] Out: 1752 [Urine:1750; Stool:2]  GENERAL:alert, no distress and comfortable NEURO: alert & oriented x 3 with fluent speech, no focal motor/sensory deficits  LABORATORY DATA:  I have reviewed the data as listed CMP Latest Ref Rng & Units 02/25/2019 02/24/2019 02/23/2019  Glucose 70 - 99 mg/dL 129(H) 102(H) 100(H)  BUN 8 - 23 mg/dL '11 18 20  ' Creatinine 0.44 - 1.00 mg/dL 1.36(H) 1.78(H) 1.75(H)  Sodium 135 - 145 mmol/L 135 134(L) 134(L)  Potassium 3.5 - 5.1 mmol/L 3.2(L) 3.8 3.1(L)  Chloride 98 - 111 mmol/L 105 104 102  CO2 22 - 32 mmol/L '22 22 23  ' Calcium 8.9 - 10.3 mg/dL 10.4(H) 11.4(H) 11.5(H)  Total Protein 6.5 - 8.1 g/dL - 5.9(L) -  Total Bilirubin 0.3 - 1.2 mg/dL - 0.2(L) -  Alkaline Phos 38 - 126 U/L - 65 -  AST 15 - 41 U/L - 23 -  ALT 0 - 44 U/L - 8 -    Lab Results  Component Value Date   WBC 5.4 02/25/2019   HGB 12.2 02/25/2019   HCT 36.6 02/25/2019   MCV 86.7 02/25/2019   PLT 196 02/25/2019   NEUTROABS 4.2 02/22/2019    DG Chest 2 View  Result Date: 02/23/2019 CLINICAL DATA:  Pleural effusion. Chest heaviness. EXAM: CHEST - 2 VIEW COMPARISON:  Chest radiographs 02/08/2019 and CT 02/09/2019 FINDINGS: The cardiomediastinal silhouette is within normal limits. Aortic atherosclerosis is noted. There is  improved aeration of the lungs bilaterally with mild residual opacity in the right lung base. Pleural effusions have greatly decreased in size, with trace pleural fluid remaining bilaterally. No edema or pneumothorax is identified. No acute osseous abnormality is seen. IMPRESSION: 1. Trace pleural effusions, greatly decreased in size from the prior CT. 2. Improved bilateral lung aeration with mild residual right basilar opacity which may represent atelectasis. Electronically Signed   By: Logan Bores M.D.   On: 02/23/2019 08:59   DG Chest 2 View  Result Date: 02/08/2019 CLINICAL DATA:  Shortness of breath and wheezing EXAM: CHEST - 2 VIEW COMPARISON:  February 07, 2019 FINDINGS: There is airspace opacity throughout much of the right lower lobe. There may be mild pleural effusions superimposed on the right. There is atelectatic change in the left base. Heart size and pulmonary vascularity within normal limits. No adenopathy. There is aortic atherosclerosis. No bone lesions. IMPRESSION: Right lower lobe airspace opacity consistent with pneumonia. Suspect small pleural effusions superimposed on the right. Atelectatic change left base. Heart size within normal limits. No adenopathy appreciable. Aortic Atherosclerosis (ICD10-I70.0). Electronically Signed   By: Lowella Grip III M.D.   On: 02/08/2019 08:09   CT HEAD WO CONTRAST  Result Date: 02/05/2019 CLINICAL DATA:  Altered mental status EXAM: CT HEAD WITHOUT CONTRAST TECHNIQUE: Contiguous axial images  were obtained from the base of the skull through the vertex without intravenous contrast. COMPARISON:  None. FINDINGS: Brain: Mild atrophic changes are noted. No findings to suggest acute hemorrhage, acute infarction or space-occupying mass lesion are noted. Vascular: No hyperdense vessel or unexpected calcification. Skull: Normal. Negative for fracture or focal lesion. Sinuses/Orbits: No acute finding. Other: None. IMPRESSION: Mild atrophic changes without  acute abnormality. Electronically Signed   By: Inez Catalina M.D.   On: 02/05/2019 19:16   CT CHEST W CONTRAST  Result Date: 02/09/2019 CLINICAL DATA:  69 year old female with history of progressive weakness. Hypercalcemia. Evaluate for potential malignancy such as lymphoma. EXAM: CT CHEST, ABDOMEN, AND PELVIS WITH CONTRAST TECHNIQUE: Multidetector CT imaging of the chest, abdomen and pelvis was performed following the standard protocol during bolus administration of intravenous contrast. CONTRAST:  126m OMNIPAQUE IOHEXOL 300 MG/ML  SOLN COMPARISON:  Chest CT 01/07/2014. CT the abdomen and pelvis 11/24/2012. FINDINGS: CT CHEST FINDINGS Cardiovascular: Heart size is normal. There is no significant pericardial fluid, thickening or pericardial calcification. There is aortic atherosclerosis, as well as atherosclerosis of the great vessels of the mediastinum and the coronary arteries, including calcified atherosclerotic plaque in the left main, left anterior descending and right coronary arteries. Mediastinum/Nodes: No pathologically enlarged mediastinal or hilar lymph nodes. Esophagus is unremarkable in appearance. No axillary lymphadenopathy. Lungs/Pleura: Moderate bilateral pleural effusions lying dependently with areas of passive atelectasis in the lower lobes of the lungs bilaterally. Patchy areas of very mild ground-glass attenuation are noted in the lungs bilaterally, nonspecific. No definite consolidative airspace disease. No suspicious appearing pulmonary nodules or masses are noted. Musculoskeletal: There are no aggressive appearing lytic or blastic lesions noted in the visualized portions of the skeleton. CT ABDOMEN PELVIS FINDINGS Hepatobiliary: No suspicious cystic or solid hepatic lesions. No intra or extrahepatic biliary ductal dilatation. Diffuse gallbladder wall edema. No calcified gallstones. No overt pericholecystic inflammatory changes. Pancreas: No pancreatic mass. No pancreatic ductal dilatation.  No pancreatic or peripancreatic fluid collections or inflammatory changes. Spleen: Unremarkable. Adrenals/Urinary Tract: Spleen measures up to 12.9 x 4.6 x 11.5 cm (estimated splenic volume of 341 mL), which is upper limits of normal, and is unremarkable in appearance. Stomach/Bowel: Normal appearance of the stomach. No pathologic dilatation of small bowel or colon. A few scattered colonic diverticulae are noted, particularly in the sigmoid colon, without surrounding inflammatory changes to suggest an acute diverticulitis at this time. Normal appendix. Vascular/Lymphatic: Aortic atherosclerosis, without evidence of aneurysm or dissection in the abdominal or pelvic vasculature. No lymphadenopathy noted in the abdomen or pelvis. Reproductive: Uterus and ovaries are unremarkable in appearance. Other: Small volume of ascites, most evident adjacent to the liver. No pneumoperitoneum. Musculoskeletal: There are no aggressive appearing lytic or blastic lesions noted in the visualized portions of the skeleton. IMPRESSION: 1. No definite evidence of lymphoma or other malignancy. 2. Moderate bilateral pleural effusions lying dependently with extensive passive atelectasis in the lower lobes of the lungs bilaterally. 3. Trace volume of ascites. 4. Gallbladder wall edema, nonspecific in the setting of small volume of ascites. No definite gallstones. Gallbladder does not appear distended, nor are there are surrounding pericholecystic inflammatory changes to suggest an acute cholecystitis at this time. 5. Aortic atherosclerosis, in addition to left main and 2 vessel coronary artery disease. Assessment for potential risk factor modification, dietary therapy or pharmacologic therapy may be warranted, if clinically indicated. 6. Colonic diverticulosis without evidence of acute diverticulitis at this time. Electronically Signed   By: DMauri BrooklynD.  On: 02/09/2019 17:50   NM Bone Scan Whole Body  Result Date:  02/24/2019 CLINICAL DATA:  Progressive renal insufficiency and hypercalcemia. Concern for occult neoplasm. EXAM: NUCLEAR MEDICINE WHOLE BODY BONE SCAN TECHNIQUE: Whole body anterior and posterior images were obtained approximately 3 hours after intravenous injection of radiopharmaceutical. RADIOPHARMACEUTICALS:  22.0 mCi Technetium-12mMDP IV COMPARISON:  CTs of the chest, abdomen and pelvis 02/09/2019. CT head 02/05/2019. FINDINGS: There is no osseous uptake suspicious for metastatic disease or primary osseous malignancy. Nasal activity is likely inflammatory without clear correlate on recent head CT. There is probable periodontal disease within the maxilla. Scattered arthropathic activity is present in the shoulders, elbows, wrists, knees, ankles and 1st MTP joints. Soft tissue activity in the right forearm is attributed to the injection. Mild urine contamination in the perineal region. The soft tissue activity is otherwise unremarkable. IMPRESSION: 1. No evidence of osseous metastatic disease or primary bone malignancy. 2. Scattered mild arthropathic activity. Electronically Signed   By: WRichardean SaleM.D.   On: 02/24/2019 17:02   CT ABDOMEN PELVIS W CONTRAST  Result Date: 02/09/2019 CLINICAL DATA:  69year old female with history of progressive weakness. Hypercalcemia. Evaluate for potential malignancy such as lymphoma. EXAM: CT CHEST, ABDOMEN, AND PELVIS WITH CONTRAST TECHNIQUE: Multidetector CT imaging of the chest, abdomen and pelvis was performed following the standard protocol during bolus administration of intravenous contrast. CONTRAST:  1033mOMNIPAQUE IOHEXOL 300 MG/ML  SOLN COMPARISON:  Chest CT 01/07/2014. CT the abdomen and pelvis 11/24/2012. FINDINGS: CT CHEST FINDINGS Cardiovascular: Heart size is normal. There is no significant pericardial fluid, thickening or pericardial calcification. There is aortic atherosclerosis, as well as atherosclerosis of the great vessels of the mediastinum and the  coronary arteries, including calcified atherosclerotic plaque in the left main, left anterior descending and right coronary arteries. Mediastinum/Nodes: No pathologically enlarged mediastinal or hilar lymph nodes. Esophagus is unremarkable in appearance. No axillary lymphadenopathy. Lungs/Pleura: Moderate bilateral pleural effusions lying dependently with areas of passive atelectasis in the lower lobes of the lungs bilaterally. Patchy areas of very mild ground-glass attenuation are noted in the lungs bilaterally, nonspecific. No definite consolidative airspace disease. No suspicious appearing pulmonary nodules or masses are noted. Musculoskeletal: There are no aggressive appearing lytic or blastic lesions noted in the visualized portions of the skeleton. CT ABDOMEN PELVIS FINDINGS Hepatobiliary: No suspicious cystic or solid hepatic lesions. No intra or extrahepatic biliary ductal dilatation. Diffuse gallbladder wall edema. No calcified gallstones. No overt pericholecystic inflammatory changes. Pancreas: No pancreatic mass. No pancreatic ductal dilatation. No pancreatic or peripancreatic fluid collections or inflammatory changes. Spleen: Unremarkable. Adrenals/Urinary Tract: Spleen measures up to 12.9 x 4.6 x 11.5 cm (estimated splenic volume of 341 mL), which is upper limits of normal, and is unremarkable in appearance. Stomach/Bowel: Normal appearance of the stomach. No pathologic dilatation of small bowel or colon. A few scattered colonic diverticulae are noted, particularly in the sigmoid colon, without surrounding inflammatory changes to suggest an acute diverticulitis at this time. Normal appendix. Vascular/Lymphatic: Aortic atherosclerosis, without evidence of aneurysm or dissection in the abdominal or pelvic vasculature. No lymphadenopathy noted in the abdomen or pelvis. Reproductive: Uterus and ovaries are unremarkable in appearance. Other: Small volume of ascites, most evident adjacent to the liver. No  pneumoperitoneum. Musculoskeletal: There are no aggressive appearing lytic or blastic lesions noted in the visualized portions of the skeleton. IMPRESSION: 1. No definite evidence of lymphoma or other malignancy. 2. Moderate bilateral pleural effusions lying dependently with extensive passive atelectasis in the lower  lobes of the lungs bilaterally. 3. Trace volume of ascites. 4. Gallbladder wall edema, nonspecific in the setting of small volume of ascites. No definite gallstones. Gallbladder does not appear distended, nor are there are surrounding pericholecystic inflammatory changes to suggest an acute cholecystitis at this time. 5. Aortic atherosclerosis, in addition to left main and 2 vessel coronary artery disease. Assessment for potential risk factor modification, dietary therapy or pharmacologic therapy may be warranted, if clinically indicated. 6. Colonic diverticulosis without evidence of acute diverticulitis at this time. Electronically Signed   By: Vinnie Langton M.D.   On: 02/09/2019 17:50   US RENAL  Result Date: 02/24/2019 CLINICAL DATA:  Acute kidney injury EXAM: RENAL / URINARY TRACT ULTRASOUND COMPLETE COMPARISON:  None. FINDINGS: Right Kidney: Renal measurements: 11.1 x 4.3 x 5 cm = volume: 123.2 mL . Echogenicity within normal limits. There is a 2.6 cm interpolar region cyst. No hydronephrosis visualized. Left Kidney: Renal measurements: 11.9 x 5.8 x 5 cm = volume: 177.3 mL. Echogenicity within normal limits. No mass or hydronephrosis visualized. Bladder: Appears normal for degree of bladder distention. Other: Minimal perinephric fluid. IMPRESSION: No hydronephrosis.  Minimal perinephric fluid. Electronically Signed   By: Macy Mis M.D.   On: 02/24/2019 09:50   DG CHEST PORT 1 VIEW  Result Date: 02/07/2019 CLINICAL DATA:  Shortness of breath EXAM: PORTABLE CHEST 1 VIEW COMPARISON:  February 24, 2013 FINDINGS: No pneumothorax. The heart, hila, and mediastinum are normal. Left lung is  clear. Haziness over the right base is identified, asymmetric to the left. IMPRESSION: Haziness over the right base, asymmetric to the left, could represent atelectasis or developing infiltrate. A PA and lateral chest x-ray may better evaluate. No other acute abnormalities identified. Electronically Signed   By: Dorise Bullion III M.D   On: 02/07/2019 12:52   DG HIP UNILAT WITH PELVIS 2-3 VIEWS LEFT  Result Date: 02/23/2019 CLINICAL DATA:  Pain in the left hip since 02/12/2019. No known injury. EXAM: DG HIP (WITH OR WITHOUT PELVIS) 2-3V LEFT COMPARISON:  CT 02/09/2019 FINDINGS: No evidence of fracture or focal lesion on the left. No joint space narrowing. Chronic calcification anterior to the greater trochanter on the right relates to chronic tendinosis of the gluteal insertion. No other pelvic finding. IMPRESSION: No abnormality seen to explain left hip pain. Electronically Signed   By: Nelson Chimes M.D.   On: 02/23/2019 10:59    ASSESSMENT AND PLAN: 1.  Hypercalcemia, recurrent, unclear etiology 2.  AKI 3.  Parkinson's disease 4.  Hypertension  -Discussed bone scan results with the patient and her husband.  There was no evidence of osseous metastatic disease or primary bone malignancy.  UPEP is still pending.  Recommend proceeding with bone marrow biopsy to rule out multiple myeloma, metastatic cancer in the bone, and occasionally sarcoidosis could be diagnosed on bone marrow biopsy.  Discussed with the patient and her husband that bone marrow biopsy preliminary results may be available at the middle of next week.  We can discuss these as an outpatient if she is stable for discharge prior to this date. -Calcium level trending downward.  Continue IV fluids.  Monitor renal function and calcium closely.   LOS: 3 days   Mikey Bussing, DNP, AGPCNP-BC, AOCNP 02/25/19   Addendum  I have seen the patient, examined her. I agree with the assessment and and plan and have edited the notes.   Pt is  scheduled for bone marrow biopsy tomorrow.  The result will be back  onto next week.  I discussed with Dr. Tyrell Antonio this morning, I agree with starting prednisone after bone marrow biopsy tomorrow, to treat presumed sarcoidosis.  I will call patient next week, to review the bone marrow biopsy results. Unless her biopsy shows malignancy or hematological disorder, she does not need follow-up with me.  She is working on getting appointment for primary care physician, she probably can follow up with Dr. Buddy Duty next week.   Truitt Merle  02/25/2019

## 2019-02-25 NOTE — Progress Notes (Addendum)
PROGRESS NOTE    Destiny Roth  FEO:[id number] DOB: Jun 23, 1950 DOA: 02/22/2019 PCP: No primary care provider on file.   Brief Narrative: 69 year old with past medical history significant for PAD, hypertension, restrictive lung disease, anxiety, glucose intolerance was recently hospitalized for hypercalcemia and had a thorough evaluation including PTH level at 5, 24 hydroxy vitamin D 65, 1.25 hydroxyvitamin D, TSH nl.CT scan for malignancy negative, since discharge, patient saw Dr Buddy Duty and labs reveal recurrent hypercalcemia, cr 1.88 and low PTH.  Patient reports weakness, decreased oral intake, leg and low back pain which have improved off of Myrbetriq.   Assessment & Plan:   Principal Problem:   Hypercalcemia Active Problems:   Hyperlipidemia   Essential hypertension   PD (Parkinson's disease) ([place])   AKI (acute kidney injury) ([place])  1-Hypercalcemia;  Recurrent, unclear etiology.  Continue with IV fluids, increase rate.  Received Zometa time one.  PTH  Related peptide less than 2.  PTH 5, vitamin D 64. 1-25 vitamin de 72 Protein electrophoresis not observed M sipke.  24 hours calcium in urine.  Was normal;  calcium urine 10.3, calcium 24 hours was 82.  Bone scan negative for metastatic diseases or MM.  ACE level elevated at 115, discussed with pulmonologist patient would benefit from prednisone 20 mg daily for one moth then 10 daily. Needs to follow up with pulmonology.  Plan for Bone marrow biopsy 1-29-- will then start prednisone discussed with Dr Burr Medico.   2-AKI;  Continue with IV fluids. Increase to 125 cc ns.  Hold lasix. Cr has decreased to 1.3. plan to continue with IV fluids.  Nephrology consulted.   Hip pain; x ray negative  HTN; hold diuretics.   PD; continue with sinemet.  Will increase to prior hospital dose of ER Sinemet to BID. And  Continue with IR TID.  Patient was advised to follow up with her neurologist for adjustment of her medications.   Pleural  effusion, bilateral improved on recent x ray Reduce on x ray.  Discussed with Dr Burr Medico, recommend pulmonology evaluation to evaluate for sarcoidosis.  Evaluated by pulmonary, no indication for bronchoscopy or thoracentesis.  ACE level elevated.   Hypokalemia; replete orally.  Weakness;  Check B 12 and cortisol level.   Estimated body mass index is 28.45 kg/m as calculated from the following:   Height as of 02/04/19: 4' 8.69" (1.44 m).   Weight as of 02/04/19: 59 kg.   DVT prophylaxis: lovenox Code Status: partial code Family Communication: care discussed with patient.  Husband at bedside Disposition Plan: Patient  from home, suspect will be discharge home. Patient needs to remain in the hospital for IV fluids for AKI and hypercalcemia, BM biopsy  1/29. Suspect home 1-29 Consultants:   Oncology    Procedures:   none  Antimicrobials:    Subjective: She feels weak tired.  Still complaining of left side thigh pain.  She doesn't have too much tremors from parkinson, she feels her parkinson symptoms  from "inside."   Objective: Vitals:   02/24/19 0446 02/24/19 1900 02/24/19 2026 02/25/19 0445  BP: (!) 142/68 (!) 145/76 (!) 158/78 136/73  Pulse: 73 84 84 75  Resp: '16 17 20 20  ' Temp: 98 F (36.7 C) 98.3 F (36.8 C) 98.4 F (36.9 C) 97.6 F (36.4 C)  TempSrc: Oral Oral Oral Oral  SpO2: 97% 98% 95% 98%    Intake/Output Summary (Last 24 hours) at 02/25/2019 1318 Last data filed at 02/25/2019 1227 Gross per 24 hour  Intake 1200.83 ml  Output 1952 ml  Net -751.17 ml   There were no vitals filed for this visit.  Examination:  General exam: NAD Respiratory system: CTA Cardiovascular system: S 1, S 2 RRR Gastrointestinal system: BS present, soft, nt Central nervous system: Alert Extremities: No edema Skin; No rashes   Data Reviewed: I have personally reviewed following labs and imaging studies  CBC: Recent Labs  Lab 02/22/19 1933 02/24/19 0337 02/25/19 0935   WBC 5.8 5.9 5.4  NEUTROABS 4.2  --   --   HGB 12.1 11.6* 12.2  HCT 36.2 35.0* 36.6  MCV 88.1 87.1 86.7  PLT 201 176 161   Basic Metabolic Panel: Recent Labs  Lab 02/22/19 1933 02/23/19 0150 02/24/19 0337 02/25/19 0935  NA 136 134* 134* 135  K 3.0* 3.1* 3.8 3.2*  CL 102 102 104 105  CO2 '24 23 22 22  ' GLUCOSE 151* 100* 102* 129*  BUN '21 20 18 11  ' CREATININE 1.95* 1.75* 1.78* 1.36*  CALCIUM 11.2* 11.5* 11.4* 10.4*  MG 1.7  --   --   --    GFR: CrCl cannot be calculated (Unknown ideal weight.). Liver Function Tests: Recent Labs  Lab 02/22/19 1933 02/24/19 0337  AST 24 23  ALT 6 8  ALKPHOS 68 65  BILITOT 0.8 0.2*  PROT 6.5 5.9*  ALBUMIN 2.9* 2.7*   No results for input(s): LIPASE, AMYLASE in the last 168 hours. No results for input(s): AMMONIA in the last 168 hours. Coagulation Profile: No results for input(s): INR, PROTIME in the last 168 hours. Cardiac Enzymes: No results for input(s): CKTOTAL, CKMB, CKMBINDEX, TROPONINI in the last 168 hours. BNP (last 3 results) No results for input(s): PROBNP in the last 8760 hours. HbA1C: No results for input(s): HGBA1C in the last 72 hours. CBG: No results for input(s): GLUCAP in the last 168 hours. Lipid Profile: No results for input(s): CHOL, HDL, LDLCALC, TRIG, CHOLHDL, LDLDIRECT in the last 72 hours. Thyroid Function Tests: No results for input(s): TSH, T4TOTAL, FREET4, T3FREE, THYROIDAB in the last 72 hours. Anemia Panel: Recent Labs    02/23/19 0150  FERRITIN 272  TIBC 293  IRON 41   Sepsis Labs: No results for input(s): PROCALCITON, LATICACIDVEN in the last 168 hours.  No results found for this or any previous visit (from the past 240 hour(s)).       Radiology Studies: NM Bone Scan Whole Body  Result Date: 02/24/2019 CLINICAL DATA:  Progressive renal insufficiency and hypercalcemia. Concern for occult neoplasm. EXAM: NUCLEAR MEDICINE WHOLE BODY BONE SCAN TECHNIQUE: Whole body anterior and posterior  images were obtained approximately 3 hours after intravenous injection of radiopharmaceutical. RADIOPHARMACEUTICALS:  22.0 mCi Technetium-74mMDP IV COMPARISON:  CTs of the chest, abdomen and pelvis 02/09/2019. CT head 02/05/2019. FINDINGS: There is no osseous uptake suspicious for metastatic disease or primary osseous malignancy. Nasal activity is likely inflammatory without clear correlate on recent head CT. There is probable periodontal disease within the maxilla. Scattered arthropathic activity is present in the shoulders, elbows, wrists, knees, ankles and 1st MTP joints. Soft tissue activity in the right forearm is attributed to the injection. Mild urine contamination in the perineal region. The soft tissue activity is otherwise unremarkable. IMPRESSION: 1. No evidence of osseous metastatic disease or primary bone malignancy. 2. Scattered mild arthropathic activity. Electronically Signed   By: WRichardean SaleM.D.   On: 02/24/2019 17:02   [place]RENAL  Result Date: 02/24/2019 CLINICAL DATA:  Acute kidney injury EXAM:  RENAL / URINARY TRACT ULTRASOUND COMPLETE COMPARISON:  None. FINDINGS: Right Kidney: Renal measurements: 11.1 x 4.3 x 5 cm = volume: 123.2 mL . Echogenicity within normal limits. There is a 2.6 cm interpolar region cyst. No hydronephrosis visualized. Left Kidney: Renal measurements: 11.9 x 5.8 x 5 cm = volume: 177.3 mL. Echogenicity within normal limits. No mass or hydronephrosis visualized. Bladder: Appears normal for degree of bladder distention. Other: Minimal perinephric fluid. IMPRESSION: No hydronephrosis.  Minimal perinephric fluid. Electronically Signed   By: Macy Mis M.D.   On: 02/24/2019 09:50        Scheduled Meds: . acetaminophen  1,000 mg Oral TID   Or  . acetaminophen  650 mg Rectal TID  . amantadine  100 mg Oral Daily  . amLODipine  10 mg Oral Daily  . aspirin  81 mg Oral Daily  . carbidopa-levodopa  1 tablet Oral TID  . Carbidopa-Levodopa ER  1 tablet Oral BID    . enoxaparin (LOVENOX) injection  40 mg Subcutaneous Q24H  . rOPINIRole  1 mg Oral TID  . rosuvastatin  5 mg Oral QPM  . senna  1 tablet Oral BID  . sertraline  100 mg Oral Daily   Continuous Infusions: . sodium chloride 100 mL/hr at 02/25/19 1233     LOS: 3 days    Time spent: 35 minutes,     Meleana Commerford A Christiana Gurevich, MD [facility]  If 7PM-7AM, please contact night-coverage [email or web address]  02/25/2019, 1:18 PM

## 2019-02-25 NOTE — Progress Notes (Signed)
[facility] ROUNDING NOTE   Subjective:   69 year old lady with a history of Parkinson disease hypertension restrictive lung disease and chronic anxiety.  She was hospitalized for hypercalcemia in January 2021 with a thorough work-up including a CT scan that was negative for malignancy.  She had an SPEP performed 02/03/2019 with a normal kappa/lambda light chain.  She had lab work performed by endocrinology 02/23/2019 revealed an increase in her creatinine to 1.88 and the calcium level 12.2.  The rising calcium as well as acute kidney injury precipitated a admission to the hospital.  She underwent Zometa 4 mg IV 02/22/2019.  A baseline serum creatinine 0.96 02/10/2019.  Her IV fluids were changed to normal saline at 125 cc an hour noted to promote calcium diuresis.  Blood pressure 136/73 pulse 75 temperature 97.6 O2 sats 98% room air  Sodium 134 potassium 3.8 chloride 104 CO2 22 BUN 18 creatinine 1.78 calcium 11.4 glucose 102 alkaline phosphatase 65 albumin 2.7 AST 23 ALT 8 iron saturations 14%.  WBC 5.9 hemoglobin 11.6 platelets 176  Urine output 2 L 02/24/2019   Objective:  Vital signs in last 24 hours:  Temp:  [97.6 F (36.4 C)-98.4 F (36.9 C)] 97.6 F (36.4 C) (01/28 0445) Pulse Rate:  [75-84] 75 (01/28 0445) Resp:  [17-20] 20 (01/28 0445) BP: (136-158)/(73-78) 136/73 (01/28 0445) SpO2:  [95 %-98 %] 98 % (01/28 0445)  Weight change:  There were no vitals filed for this visit.  Intake/Output: I/O last 3 completed shifts: In: 2293.4 [P.O.:360; I.V.:1933.4] Out: 2802 [Urine:2800; Stool:2]   Intake/Output this shift:  No intake/output data recorded. Alert nondistressed CVS-regular rate and rhythm no murmurs rubs or gallops RS-clear to auscultation no wheeze or rales ABD- BS present soft non-distended EXT- no edema   Basic Metabolic Panel: Recent Labs  Lab 02/22/19 1933 02/23/19 0150 02/24/19 0337  NA 136 134* 134*  K 3.0* 3.1* 3.8  CL 102 102 104  CO2 _0 GLUCOSE 151* 100* 102*  BUN _1 CREATININE 1.95* 1.75* 1.78*  CALCIUM 11.2* 11.5* 11.4*  MG 1.7  --   --     Liver Function Tests: Recent Labs  Lab 02/22/19 1933 02/24/19 0337  AST 24 23  ALT 6 8  ALKPHOS 68 65  BILITOT 0.8 0.2*  PROT 6.5 5.9*  ALBUMIN 2.9* 2.7*   No results for input(s): LIPASE, AMYLASE in the last 168 hours. No results for input(s): AMMONIA in the last 168 hours.  CBC: Recent Labs  Lab 02/22/19 1933 02/24/19 0337  WBC 5.8 5.9  NEUTROABS 4.2  --   HGB 12.1 11.6*  HCT 36.2 35.0*  MCV 88.1 87.1  PLT 201 176    Cardiac Enzymes: No results for input(s): CKTOTAL, CKMB, CKMBINDEX, TROPONINI in the last 168 hours.  BNP: Invalid input(s): POCBNP  CBG: No results for input(s): GLUCAP in the last 168 hours.  Microbiology: Results for orders placed or performed during the hospital encounter of 02/03/19  SARS CORONAVIRUS 2 (TAT 6-24 HRS) Nasopharyngeal Nasopharyngeal Swab     Status: None   Collection Time: 02/03/19  2:08 PM   Specimen: Nasopharyngeal Swab  Result Value Ref Range Status   SARS Coronavirus 2 NEGATIVE NEGATIVE Final    Comment: (NOTE) SARS-CoV-2 target nucleic acids are NOT DETECTED. The SARS-CoV-2 RNA is generally detectable in upper and lower respiratory specimens during the acute phase of infection. Negative results do not preclude SARS-CoV-2 infection, do not rule out co-infections with  other pathogens, and should not be used as the sole basis for treatment or other patient management decisions. Negative results must be combined with clinical observations, patient history, and epidemiological information. The expected result is Negative. Fact Sheet for Patients: SugarRoll.be Fact Sheet for Healthcare Providers: https://www.woods-mathews.com/ This test is not yet approved or cleared by the [place] FDA and  has been authorized for detection and/or diagnosis of  SARS-CoV-2 by FDA under an Emergency Use Authorization (EUA). This EUA will remain  in effect (meaning this test can be used) for the duration of the COVID-19 declaration under Section 56 4(b)(1) of the Act, 21 U.S.C. section 360bbb-3(b)(1), unless the authorization is terminated or revoked sooner. Performed at [facility], [place] [address] [place], [place] [address]   Aerobic/Anaerobic Culture (surgical/deep wound)     Status: None   Collection Time: 02/07/19 11:03 AM   Specimen: Vaginal  Result Value Ref Range Status   Specimen Description VAGINA  Final   Special Requests LOOKING FOR YEAST  Final   Gram Stain   Final    NO WBC SEEN ABUNDANT GRAM POSITIVE RODS MODERATE GRAM POSITIVE COCCI IN PAIRS IN CLUSTERS FEW GRAM NEGATIVE RODS Performed at [facility], [place] [address], [place], [place] [address]    Culture   Final    FEW ESCHERICHIA COLI WITHIN MIXED FLORA MIXED ANAEROBIC FLORA PRESENT.  CALL LAB IF FURTHER IID REQUIRED.    Report Status 02/11/2019 FINAL  Final   Organism ID, Bacteria ESCHERICHIA COLI  Final      Susceptibility   Escherichia coli - MIC*    AMPICILLIN 4 SENSITIVE Sensitive     CEFAZOLIN <=4 SENSITIVE Sensitive     CEFEPIME <=0.12 SENSITIVE Sensitive     CEFTAZIDIME <=1 SENSITIVE Sensitive     CEFTRIAXONE <=0.25 SENSITIVE Sensitive     CIPROFLOXACIN <=0.25 SENSITIVE Sensitive     GENTAMICIN <=1 SENSITIVE Sensitive     IMIPENEM <=0.25 SENSITIVE Sensitive     TRIMETH/SULFA <=20 SENSITIVE Sensitive     AMPICILLIN/SULBACTAM <=2 SENSITIVE Sensitive     PIP/TAZO <=4 SENSITIVE Sensitive     * FEW ESCHERICHIA COLI    Coagulation Studies: No results for input(s): LABPROT, INR in the last 72 hours.  Urinalysis: Recent Labs    02/24/19 1400  COLORURINE STRAW*  LABSPEC 1.006  PHURINE 6.0  GLUCOSEU NEGATIVE  HGBUR SMALL*  BILIRUBINUR NEGATIVE  KETONESUR NEGATIVE  PROTEINUR NEGATIVE  NITRITE NEGATIVE  LEUKOCYTESUR NEGATIVE       Imaging: DG Chest 2 View  Result Date: 02/23/2019 CLINICAL DATA:  Pleural effusion. Chest heaviness. EXAM: CHEST - 2 VIEW COMPARISON:  Chest radiographs 02/08/2019 and CT 02/09/2019 FINDINGS: The cardiomediastinal silhouette is within normal limits. Aortic atherosclerosis is noted. There is improved aeration of the lungs bilaterally with mild residual opacity in the right lung base. Pleural effusions have greatly decreased in size, with trace pleural fluid remaining bilaterally. No edema or pneumothorax is identified. No acute osseous abnormality is seen. IMPRESSION: 1. Trace pleural effusions, greatly decreased in size from the prior CT. 2. Improved bilateral lung aeration with mild residual right basilar opacity which may represent atelectasis. Electronically Signed   By: Logan Bores M.D.   On: 02/23/2019 08:59   NM Bone Scan Whole Body  Result Date: 02/24/2019 CLINICAL DATA:  Progressive renal insufficiency and hypercalcemia. Concern for occult neoplasm. EXAM: NUCLEAR MEDICINE WHOLE BODY BONE SCAN TECHNIQUE: Whole body anterior and posterior images were obtained approximately 3 hours after  intravenous injection of radiopharmaceutical. RADIOPHARMACEUTICALS:  22.0 mCi Technetium-42mMDP IV COMPARISON:  CTs of the chest, abdomen and pelvis 02/09/2019. CT head 02/05/2019. FINDINGS: There is no osseous uptake suspicious for metastatic disease or primary osseous malignancy. Nasal activity is likely inflammatory without clear correlate on recent head CT. There is probable periodontal disease within the maxilla. Scattered arthropathic activity is present in the shoulders, elbows, wrists, knees, ankles and 1st MTP joints. Soft tissue activity in the right forearm is attributed to the injection. Mild urine contamination in the perineal region. The soft tissue activity is otherwise unremarkable. IMPRESSION: 1. No evidence of osseous metastatic disease or primary bone malignancy. 2. Scattered mild arthropathic  activity. Electronically Signed   By: WRichardean SaleM.D.   On: 02/24/2019 17:02   [place]RENAL  Result Date: 02/24/2019 CLINICAL DATA:  Acute kidney injury EXAM: RENAL / URINARY TRACT ULTRASOUND COMPLETE COMPARISON:  None. FINDINGS: Right Kidney: Renal measurements: 11.1 x 4.3 x 5 cm = volume: 123.2 mL . Echogenicity within normal limits. There is a 2.6 cm interpolar region cyst. No hydronephrosis visualized. Left Kidney: Renal measurements: 11.9 x 5.8 x 5 cm = volume: 177.3 mL. Echogenicity within normal limits. No mass or hydronephrosis visualized. Bladder: Appears normal for degree of bladder distention. Other: Minimal perinephric fluid. IMPRESSION: No hydronephrosis.  Minimal perinephric fluid. Electronically Signed   By: PMacy MisM.D.   On: 02/24/2019 09:50   DG HIP UNILAT WITH PELVIS 2-3 VIEWS LEFT  Result Date: 02/23/2019 CLINICAL DATA:  Pain in the left hip since 02/12/2019. No known injury. EXAM: DG HIP (WITH OR WITHOUT PELVIS) 2-3V LEFT COMPARISON:  CT 02/09/2019 FINDINGS: No evidence of fracture or focal lesion on the left. No joint space narrowing. Chronic calcification anterior to the greater trochanter on the right relates to chronic tendinosis of the gluteal insertion. No other pelvic finding. IMPRESSION: No abnormality seen to explain left hip pain. Electronically Signed   By: MNelson ChimesM.D.   On: 02/23/2019 10:59     Medications:   . sodium chloride 125 mL/hr at 02/25/19 0239   . acetaminophen  1,000 mg Oral TID   Or  . acetaminophen  650 mg Rectal TID  . amantadine  100 mg Oral Daily  . amLODipine  10 mg Oral Daily  . aspirin  81 mg Oral Daily  . carbidopa-levodopa  1 tablet Oral TID  . Carbidopa-Levodopa ER  1 tablet Oral BID  . enoxaparin (LOVENOX) injection  40 mg Subcutaneous Q24H  . rOPINIRole  1 mg Oral TID  . rosuvastatin  5 mg Oral QPM  . senna  1 tablet Oral BID  . sertraline  100 mg Oral Daily   traMADol  Assessment/ Plan:   Acute kidney injury  with an increase in serum creatinine 1.78 since discharge 02/11/2019.  No evidence of any etiology.  There was no use of nephrotoxins no ACE inhibitor is no ARB is no use nonsteroidal anti-inflammatories no Cox 2 inhibitors.  There was the administration of IV contrast 02/09/2019.  She has good urine output with 2 L diuresed 02/24/2019.  Urinalysis negative for protein.  0-5 RBCs 0-5 WBCs  Hypertension/volume appears adequately controlled has been successfully volume resuscitated  Hypercalcemia.  The etiology of hypercalcemia has been elusive so far.  Oncology is proceeding with a bone scan as well as possible bone marrow biopsy in the event that this is a nonsecreting plasmacytoma.  Appreciate the assistance of [facility] who did a  thorough review of medications 02/24/2019.  ACE level was elevated.  This can be seen in sarcoidosis.  Although this would be a little unusual not see anything on her chest CT.  Bone scan showed no evidence of metastatic disease.  We will continue normal saline at 125 cc an hour.   LOS: [place] _0 _1 :11 AM

## 2019-02-25 NOTE — Plan of Care (Signed)
  Problem: Education: Goal: Knowledge of General Education information will improve Description: Including pain rating scale, medication(s)/side effects and non-pharmacologic comfort measures Outcome: Progressing   Problem: Health Behavior/Discharge Planning: Goal: Ability to manage health-related needs will improve Outcome: Progressing   Problem: Clinical Measurements: Goal: Respiratory complications will improve Outcome: Progressing   Problem: Elimination: Goal: Will not experience complications related to bowel motility Outcome: Progressing Goal: Will not experience complications related to urinary retention Outcome: Progressing   Problem: Pain Managment: Goal: General experience of comfort will improve Outcome: Progressing   Problem: Safety: Goal: Ability to remain free from injury will improve Outcome: Progressing   Problem: Skin Integrity: Goal: Risk for impaired skin integrity will decrease Outcome: Progressing   

## 2019-02-25 NOTE — Progress Notes (Signed)
Initial Nutrition Assessment  DOCUMENTATION CODES:   Not applicable  INTERVENTION:   -Boost Breeze po TID, each supplement provides 250 kcal and 9 grams of protein  NUTRITION DIAGNOSIS:   Inadequate oral intake related to decreased appetite as evidenced by meal completion < 50%.  GOAL:   Patient will meet greater than or equal to 90% of their needs  MONITOR:   PO intake, Supplement acceptance, Diet advancement, Labs, Weight trends, Skin, I & O's  REASON FOR ASSESSMENT:   Consult Diet education  ASSESSMENT:   Destiny Roth is a 69 y.o. female with medical history significant of PD, HTN, restrictive lung disease, anxiety. glucose intolerance. She was recently hospitalized for hypercalcemia and had a thorough evaluation including PTH level 5, 24 hydroxy Vit D 65, 1,25 hydroxy Vit D nl, TSH nl.  CT scanning looking for malignancy negative. Dr. Lindi Adie was consulted who recommend Zometa 4 mg IV. She had mild AKI and at d/c Cr 1.03. Since d/c she has seen Dr. Cherlynn Perches for primary care. She saw Dr. Buddy Duty today and lab revealed recurrent hypercalcemia to 12.2, Cr 1.88, low PTH. Labs from outpatient not accessible. Patient endorses weakness, decresaed PO intake, leg and low back pain that is improving off myrbetriq  Pt admitted with hypercalcemia.   Reviewed I/O's: -551 ml x 24 hours and +1.7 L since admission  UOP: 1.8 L x 24 hours   Attempted to see pt x 2, however, in with MD at times of visit. Also attempted to speak with pt via phone x 2, however, no answer.   RD consulted to provide education secondary to hypocalcemia. Reviewed H&P from Dr. Melvia Heaps on 02/03/19; pt was on calcium supplements (1200 mg) MVI containing calcium, and vitamin D, which were discontinued during that visit. Per MD notes, pt with hypercalcemia over the past 6 months.   Per chart review, pharmacy reviewed medications. Noted that TZDs, lithium, and excessive vitamin D, vitamin A, or calcium can result in  hypercalcemia, or excessive use of calcium carbonate. Per review of medical record, pt was not on these medications PTA or during this hospitalization.   Pt with poor oral intake during hospitalization; noted meal completion 25-50%. Given decreased oral intake, do not suspect that hypercalcemia is due to a nutritional cause. Pt awaiting bone marrow biopsy for further work-up.  Noted pt with distant history of weight loss. Pt is at high risk for malnutrition, however, RD unable to identify at this time.   Medications reviewed and include sinemet, senna, and 0.9% sodium chloride infusion @ 125 ml/hr.   Labs reviewed: K: 3.0.   Diet Order:   Diet Order            Diet NPO time specified Except for: Sips with Meds  Diet effective midnight        Diet regular Room service appropriate? Yes; Fluid consistency: Thin  Diet effective now              EDUCATION NEEDS:   No education needs have been identified at this time  Skin:  Skin Assessment: Reviewed RN Assessment  Last BM:  02/25/19  Height:   Ht Readings from Last 1 Encounters:  02/04/19 4' 8.69" (1.44 m)    Weight:   Wt Readings from Last 1 Encounters:  02/04/19 59 kg    Ideal Body Weight:  43.8 kg  BMI:  There is no height or weight on file to calculate BMI.  Estimated Nutritional Needs:   Kcal:  1600-1800  Protein:  80-95 grams  Fluid:  1.6-1.8 L    Silvino Selman A. Jimmye Norman, RD, LDN, [place] Registered Dietitian II Certified Diabetes Care and Education Specialist Pager: [phone] After hours Pager: [phone]

## 2019-02-26 ENCOUNTER — Inpatient Hospital Stay (HOSPITAL_COMMUNITY): Payer: Medicare Other

## 2019-02-26 DIAGNOSIS — D869 Sarcoidosis, unspecified: Secondary | ICD-10-CM

## 2019-02-26 HISTORY — DX: Sarcoidosis, unspecified: D86.9

## 2019-02-26 LAB — BASIC METABOLIC PANEL
Anion gap: 8 (ref 5–15)
BUN: 11 mg/dL (ref 8–23)
CO2: 21 mmol/L — ABNORMAL LOW (ref 22–32)
Calcium: 10.2 mg/dL (ref 8.9–10.3)
Chloride: 108 mmol/L (ref 98–111)
Creatinine, Ser: 1.31 mg/dL — ABNORMAL HIGH (ref 0.44–1.00)
GFR calc Af Amer: 48 mL/min — ABNORMAL LOW (ref >60)
GFR calc non Af Amer: 41 mL/min — ABNORMAL LOW (ref >60)
Glucose, Bld: 104 mg/dL — ABNORMAL HIGH (ref 70–99)
Potassium: 3.1 mmol/L — ABNORMAL LOW (ref 3.5–5.1)
Sodium: 137 mmol/L (ref 135–145)

## 2019-02-26 LAB — CBC WITH DIFFERENTIAL/PLATELET
Abs Immature Granulocytes: 0.03 10*3/uL (ref 0.00–0.07)
Basophils Absolute: 0.1 10*3/uL (ref 0.0–0.1)
Basophils Relative: 1 %
Eosinophils Absolute: 0.5 10*3/uL (ref 0.0–0.5)
Eosinophils Relative: 10 %
HCT: 33.5 % — ABNORMAL LOW (ref 36.0–46.0)
Hemoglobin: 11.2 g/dL — ABNORMAL LOW (ref 12.0–15.0)
Immature Granulocytes: 1 %
Lymphocytes Relative: 11 %
Lymphs Abs: 0.5 10*3/uL — ABNORMAL LOW (ref 0.7–4.0)
MCH: 28.9 pg (ref 26.0–34.0)
MCHC: 33.4 g/dL (ref 30.0–36.0)
MCV: 86.6 fL (ref 80.0–100.0)
Monocytes Absolute: 0.4 10*3/uL (ref 0.1–1.0)
Monocytes Relative: 8 %
Neutro Abs: 3.3 10*3/uL (ref 1.7–7.7)
Neutrophils Relative %: 69 %
Platelets: 168 10*3/uL (ref 150–400)
RBC: 3.87 MIL/uL (ref 3.87–5.11)
RDW: 13.3 % (ref 11.5–15.5)
WBC: 4.8 10*3/uL (ref 4.0–10.5)
nRBC: 0 % (ref 0.0–0.2)

## 2019-02-26 LAB — CORTISOL-AM, BLOOD: Cortisol - AM: 11.8 ug/dL (ref 6.7–22.6)

## 2019-02-26 LAB — VITAMIN B12: Vitamin B-12: 335 pg/mL (ref 180–914)

## 2019-02-26 MED ORDER — POTASSIUM CHLORIDE CRYS ER 20 MEQ PO TBCR: 20.0000 meq | EXTENDED_RELEASE_TABLET | Freq: Every day | ORAL | 0 refills | Status: DC

## 2019-02-26 MED ORDER — TRAMADOL HCL 50 MG PO TABS: 50.0000 mg | ORAL_TABLET | Freq: Four times a day (QID) | ORAL | 0 refills | Status: AC | PRN

## 2019-02-26 MED ORDER — FENTANYL CITRATE (PF) 100 MCG/2ML IJ SOLN
INTRAMUSCULAR | Status: AC | PRN
  Administered 2019-02-26: 100 ug via INTRAVENOUS

## 2019-02-26 MED ORDER — MIDAZOLAM HCL 2 MG/2ML IJ SOLN
INTRAMUSCULAR | Status: AC
  Filled 2019-02-26: qty 4

## 2019-02-26 MED ORDER — SENNA 8.6 MG PO TABS: 1.0000 | ORAL_TABLET | Freq: Two times a day (BID) | ORAL | 0 refills | Status: DC

## 2019-02-26 MED ORDER — TRAMADOL HCL 50 MG PO TABS: 50.0000 mg | ORAL_TABLET | Freq: Four times a day (QID) | ORAL | 0 refills | Status: DC | PRN

## 2019-02-26 MED ORDER — CYANOCOBALAMIN 100 MCG PO TABS: 100.0000 ug | ORAL_TABLET | Freq: Every day | ORAL | 0 refills | Status: DC

## 2019-02-26 MED ORDER — PREDNISONE 20 MG PO TABS: 20.0000 mg | ORAL_TABLET | Freq: Every day | ORAL | 1 refills | Status: DC

## 2019-02-26 MED ORDER — FENTANYL CITRATE (PF) 100 MCG/2ML IJ SOLN
INTRAMUSCULAR | Status: AC
  Filled 2019-02-26: qty 4

## 2019-02-26 MED ORDER — VITAMIN B-12 100 MCG PO TABS
100.0000 ug | ORAL_TABLET | Freq: Every day | ORAL | Status: DC
  Administered 2019-02-26: 100 ug via ORAL
  Filled 2019-02-26 (×2): qty 1

## 2019-02-26 MED ORDER — SENNA 8.6 MG PO TABS: 1.0000 | ORAL_TABLET | Freq: Two times a day (BID) | ORAL | 0 refills | Status: AC

## 2019-02-26 MED ORDER — PREDNISONE 20 MG PO TABS
20.0000 mg | ORAL_TABLET | Freq: Every day | ORAL | Status: DC
  Administered 2019-02-26: 20 mg via ORAL
  Filled 2019-02-26: qty 1

## 2019-02-26 MED ORDER — CARBIDOPA-LEVODOPA ER 25-100 MG PO TBCR: 1.0000 | EXTENDED_RELEASE_TABLET | Freq: Two times a day (BID) | ORAL | 0 refills | Status: DC

## 2019-02-26 MED ORDER — POTASSIUM CHLORIDE CRYS ER 20 MEQ PO TBCR
40.0000 meq | EXTENDED_RELEASE_TABLET | ORAL | Status: AC
  Administered 2019-02-26 (×2): 40 meq via ORAL
  Filled 2019-02-26 (×2): qty 2

## 2019-02-26 MED ORDER — SODIUM CHLORIDE 0.9 % IV SOLN
INTRAVENOUS | Status: AC | PRN
  Administered 2019-02-26: 10 mL/h via INTRAVENOUS

## 2019-02-26 MED ORDER — MIDAZOLAM HCL 2 MG/2ML IJ SOLN
INTRAMUSCULAR | Status: AC | PRN
  Administered 2019-02-26 (×2): 1 mg via INTRAVENOUS

## 2019-02-26 NOTE — Discharge Summary (Signed)
Physician Discharge Summary  Destiny Roth WUJ:[id number] DOB: July 28, 1950 DOA: 02/22/2019  PCP: No primary care provider on file.  Admit date: 02/22/2019 Discharge date: 02/26/2019  Admitted From: Home  Disposition:  Home   Recommendations for Outpatient Follow-up:  1. Follow up with PCP in 1-2 weeks 2. Please obtain BMP/CBC in one week 3. Dr Burr Medico will call patient with Bone Marrow Biopsy.  4. Follow up with Dr Buddy Duty in 1 week  For repeat Calcium and cr level. And for further evaluation of Hypercalcemia.  5. Needs to follow up with Pulmonology for further evaluation of sarcoidosis and taper of prednisone.   Home Health: Yes.   Discharge Condition: Stable.  CODE STATUS: Full code Diet recommendation: Heart Healthy  Brief/Interim Summary: 69 year old with past medical history significant for PAD, hypertension, restrictive lung disease, anxiety, glucose intolerance was recently hospitalized for hypercalcemia and had a thorough evaluation including PTH level at 5, 24 hydroxy vitamin D 65, 1.25 hydroxyvitamin D, TSH nl.CT scan for malignancy negative, since discharge, patient saw Dr Buddy Duty and labs reveal recurrent hypercalcemia, cr 1.88 and low PTH.  Patient reports weakness, decreased oral intake, leg and low back pain which have improved off of Myrbetriq.  1-Hypercalcemia;  -Recurrent, unclear etiology. Might be related to sarcoidosis, ACE level elevated, awaiting BM Biopsy results. -She will need close follow up with Dr Buddy Duty endocrinologist. She will be discharge on low dose prednisone 20 mg daily. Further adjustment of prednisone per Pulmonology and Endocrinology.  -Received Zometa during this admission.  -PTH  Related peptide less than 2. PTH 5, vitamin D 64. 1-25 vitamin de 72 -Protein electrophoresis not observed M sipke.  -24 hours calcium in urine.  Was normal;  calcium urine 10.3, calcium 24 hours was 82.  -Bone scan negative for metastatic diseases or MM.  -ACE level elevated  at 115, discussed with pulmonologist patient would benefit from prednisone 20 mg daily for one moth then 10 daily. Needs to follow up with pulmonology.  -calcium has decreased to 10, cr decrease to 1.3 she was treated with IV fluids NS at 125 cc/hr.    2-AKI;  Improved with IV fluids.  Hold lasix at discharge. Cr has decreased to 1.3. plan to continue with IV fluids.  Renal US negative for obstruction.  Renal function improved with IV fluids.   Hip pain, thigh ; x ray negative.  Patient believes pain was related to Myrbetriq.   unknown why she was started on Myrbetriq.  HTN; Discharge off lasix.   PD; continue with sinemet.  Will increase to prior hospital dose of ER Sinemet to BID. And  Continue with IR TID.  Patient was advised to follow up with her neurologist for adjustment of her medications. unclear why her medications were change last admission.   Pleural effusion, bilateral improved on recent x ray Discussed with Dr Burr Medico, recommend pulmonology evaluation to evaluate for sarcoidosis.  Evaluated by pulmonary, no indication for bronchoscopy or thoracentesis.  ACE level elevated. Discussed with Dr Tamala Julian, start patient on prednisone 20 mg daily for one month, then 10 mg daily for a month. Follow up with pulmonary.   Hypokalemia; replete orally. Will provide 20 meq daily for 3 days. She will need repeat level, due to recent AKI.   Weakness;  B 12 low normal range. Will provide supplement.  cortisol level was normal .   Discharge Diagnoses:  Principal Problem:   Hypercalcemia Active Problems:   Hyperlipidemia   Essential hypertension   PD (Parkinson's disease) (  [place])   AKI (acute kidney injury) [facility])    Discharge Instructions  Discharge Instructions    Diet - low sodium heart healthy   Complete by: As directed    Increase activity slowly   Complete by: As directed      Allergies as of 02/26/2019      Reactions   Myrbetriq [mirabegron] Other (See Comments)    SEVERE muscle and joint pain   Lipitor [atorvastatin] Other (See Comments)   Muscle pain   Zocor [simvastatin] Other (See Comments)   Muscle pain      Medication List    STOP taking these medications   furosemide 20 MG tablet Commonly known as: Lasix   mirabegron ER 25 MG Tb24 tablet Commonly known as: MYRBETRIQ     TAKE these medications   acetaminophen 500 MG tablet Commonly known as: TYLENOL Take 1,000 mg by mouth every 6 (six) hours as needed for mild pain.   Amantadine HCl 100 MG tablet Take 1 tablet (100 mg total) by mouth daily.   amLODipine 10 MG tablet Commonly known as: NORVASC Take 1 tablet (10 mg total) by mouth daily.   aspirin 81 MG tablet Take 81 mg by mouth daily.   carbidopa-levodopa 25-100 MG tablet Commonly known as: SINEMET IR TAKE ONE TABLET BY MOUTH THREE TIMES DAILY What changed:   when to take this  additional instructions   Carbidopa-Levodopa ER 25-100 MG tablet controlled release Commonly known as: SINEMET CR Take 1 tablet by mouth 2 (two) times daily. What changed: when to take this   cyanocobalamin 100 MCG tablet Take 1 tablet (100 mcg total) by mouth daily.   hydrocortisone 25 MG suppository Commonly known as: Anusol-HC Place 1 suppository (25 mg total) rectally at bedtime as needed for hemorrhoids or itching.   potassium chloride SA 20 MEQ tablet Commonly known as: KLOR-CON Take 1 tablet (20 mEq total) by mouth daily.   predniSONE 20 MG tablet Commonly known as: DELTASONE Take 1 tablet (20 mg total) by mouth daily with breakfast.   rOPINIRole 1 MG tablet Commonly known as: REQUIP Take 1 tablet (1 mg total) by mouth 3 (three) times daily. What changed:   when to take this  additional instructions   rosuvastatin 5 MG tablet Commonly known as: CRESTOR Take 5 mg by mouth every evening.   senna 8.6 MG Tabs tablet Commonly known as: SENOKOT Take 1 tablet (8.6 mg total) by mouth 2 (two) times daily.   sertraline 100  MG tablet Commonly known as: ZOLOFT Take 100 mg by mouth daily.   traMADol 50 MG tablet Commonly known as: ULTRAM Take 1 tablet (50 mg total) by mouth every 6 (six) hours as needed for up to 5 days for moderate pain.      Follow-up Information    Margaretha Seeds, MD Follow up in 4 week(s).   Specialty: Pulmonary Disease Contact information: [address] [place] [address] [phone]        Delrae Rend, MD Follow up in 1 week(s).   Specialty: Endocrinology Contact information: [address] [place] [address] [phone]          Allergies  Allergen Reactions  . Myrbetriq [Mirabegron] Other (See Comments)    SEVERE muscle and joint pain  . Lipitor [Atorvastatin] Other (See Comments)    Muscle pain  . Zocor [Simvastatin] Other (See Comments)    Muscle pain    Consultations: Nephrology Oncology   Procedures/Studies:  DG Chest 2 View  Result Date: 02/23/2019 CLINICAL DATA:  Pleural effusion. Chest heaviness. EXAM: CHEST - 2 VIEW COMPARISON:  Chest radiographs 02/08/2019 and CT 02/09/2019 FINDINGS: The cardiomediastinal silhouette is within normal limits. Aortic atherosclerosis is noted. There is improved aeration of the lungs bilaterally with mild residual opacity in the right lung base. Pleural effusions have greatly decreased in size, with trace pleural fluid remaining bilaterally. No edema or pneumothorax is identified. No acute osseous abnormality is seen. IMPRESSION: 1. Trace pleural effusions, greatly decreased in size from the prior CT. 2. Improved bilateral lung aeration with mild residual right basilar opacity which may represent atelectasis. Electronically Signed   By: Logan Bores M.D.   On: 02/23/2019 08:59   DG Chest 2 View  Result Date: 02/08/2019 CLINICAL DATA:  Shortness of breath and wheezing EXAM: CHEST - 2 VIEW COMPARISON:  February 07, 2019 FINDINGS: There is airspace opacity throughout much of the right lower  lobe. There may be mild pleural effusions superimposed on the right. There is atelectatic change in the left base. Heart size and pulmonary vascularity within normal limits. No adenopathy. There is aortic atherosclerosis. No bone lesions. IMPRESSION: Right lower lobe airspace opacity consistent with pneumonia. Suspect small pleural effusions superimposed on the right. Atelectatic change left base. Heart size within normal limits. No adenopathy appreciable. Aortic Atherosclerosis (ICD10-I70.0). Electronically Signed   By: Lowella Grip III M.D.   On: 02/08/2019 08:09   CT HEAD WO CONTRAST  Result Date: 02/05/2019 CLINICAL DATA:  Altered mental status EXAM: CT HEAD WITHOUT CONTRAST TECHNIQUE: Contiguous axial images were obtained from the base of the skull through the vertex without intravenous contrast. COMPARISON:  None. FINDINGS: Brain: Mild atrophic changes are noted. No findings to suggest acute hemorrhage, acute infarction or space-occupying mass lesion are noted. Vascular: No hyperdense vessel or unexpected calcification. Skull: Normal. Negative for fracture or focal lesion. Sinuses/Orbits: No acute finding. Other: None. IMPRESSION: Mild atrophic changes without acute abnormality. Electronically Signed   By: Inez Catalina M.D.   On: 02/05/2019 19:16   CT CHEST W CONTRAST  Result Date: 02/09/2019 CLINICAL DATA:  69 year old female with history of progressive weakness. Hypercalcemia. Evaluate for potential malignancy such as lymphoma. EXAM: CT CHEST, ABDOMEN, AND PELVIS WITH CONTRAST TECHNIQUE: Multidetector CT imaging of the chest, abdomen and pelvis was performed following the standard protocol during bolus administration of intravenous contrast. CONTRAST:  130m OMNIPAQUE IOHEXOL 300 MG/ML  SOLN COMPARISON:  Chest CT 01/07/2014. CT the abdomen and pelvis 11/24/2012. FINDINGS: CT CHEST FINDINGS Cardiovascular: Heart size is normal. There is no significant pericardial fluid, thickening or pericardial  calcification. There is aortic atherosclerosis, as well as atherosclerosis of the great vessels of the mediastinum and the coronary arteries, including calcified atherosclerotic plaque in the left main, left anterior descending and right coronary arteries. Mediastinum/Nodes: No pathologically enlarged mediastinal or hilar lymph nodes. Esophagus is unremarkable in appearance. No axillary lymphadenopathy. Lungs/Pleura: Moderate bilateral pleural effusions lying dependently with areas of passive atelectasis in the lower lobes of the lungs bilaterally. Patchy areas of very mild ground-glass attenuation are noted in the lungs bilaterally, nonspecific. No definite consolidative airspace disease. No suspicious appearing pulmonary nodules or masses are noted. Musculoskeletal: There are no aggressive appearing lytic or blastic lesions noted in the visualized portions of the skeleton. CT ABDOMEN PELVIS FINDINGS Hepatobiliary: No suspicious cystic or solid hepatic lesions. No intra or extrahepatic biliary ductal dilatation. Diffuse gallbladder wall edema. No calcified gallstones. No overt pericholecystic inflammatory changes. Pancreas: No  pancreatic mass. No pancreatic ductal dilatation. No pancreatic or peripancreatic fluid collections or inflammatory changes. Spleen: Unremarkable. Adrenals/Urinary Tract: Spleen measures up to 12.9 x 4.6 x 11.5 cm (estimated splenic volume of 341 mL), which is upper limits of normal, and is unremarkable in appearance. Stomach/Bowel: Normal appearance of the stomach. No pathologic dilatation of small bowel or colon. A few scattered colonic diverticulae are noted, particularly in the sigmoid colon, without surrounding inflammatory changes to suggest an acute diverticulitis at this time. Normal appendix. Vascular/Lymphatic: Aortic atherosclerosis, without evidence of aneurysm or dissection in the abdominal or pelvic vasculature. No lymphadenopathy noted in the abdomen or pelvis. Reproductive:  Uterus and ovaries are unremarkable in appearance. Other: Small volume of ascites, most evident adjacent to the liver. No pneumoperitoneum. Musculoskeletal: There are no aggressive appearing lytic or blastic lesions noted in the visualized portions of the skeleton. IMPRESSION: 1. No definite evidence of lymphoma or other malignancy. 2. Moderate bilateral pleural effusions lying dependently with extensive passive atelectasis in the lower lobes of the lungs bilaterally. 3. Trace volume of ascites. 4. Gallbladder wall edema, nonspecific in the setting of small volume of ascites. No definite gallstones. Gallbladder does not appear distended, nor are there are surrounding pericholecystic inflammatory changes to suggest an acute cholecystitis at this time. 5. Aortic atherosclerosis, in addition to left main and 2 vessel coronary artery disease. Assessment for potential risk factor modification, dietary therapy or pharmacologic therapy may be warranted, if clinically indicated. 6. Colonic diverticulosis without evidence of acute diverticulitis at this time. Electronically Signed   By: Vinnie Langton M.D.   On: 02/09/2019 17:50   NM Bone Scan Whole Body  Result Date: 02/24/2019 CLINICAL DATA:  Progressive renal insufficiency and hypercalcemia. Concern for occult neoplasm. EXAM: NUCLEAR MEDICINE WHOLE BODY BONE SCAN TECHNIQUE: Whole body anterior and posterior images were obtained approximately 3 hours after intravenous injection of radiopharmaceutical. RADIOPHARMACEUTICALS:  22.0 mCi Technetium-39mMDP IV COMPARISON:  CTs of the chest, abdomen and pelvis 02/09/2019. CT head 02/05/2019. FINDINGS: There is no osseous uptake suspicious for metastatic disease or primary osseous malignancy. Nasal activity is likely inflammatory without clear correlate on recent head CT. There is probable periodontal disease within the maxilla. Scattered arthropathic activity is present in the shoulders, elbows, wrists, knees, ankles and  1st MTP joints. Soft tissue activity in the right forearm is attributed to the injection. Mild urine contamination in the perineal region. The soft tissue activity is otherwise unremarkable. IMPRESSION: 1. No evidence of osseous metastatic disease or primary bone malignancy. 2. Scattered mild arthropathic activity. Electronically Signed   By: WRichardean SaleM.D.   On: 02/24/2019 17:02   CT ABDOMEN PELVIS W CONTRAST  Result Date: 02/09/2019 CLINICAL DATA:  69year old female with history of progressive weakness. Hypercalcemia. Evaluate for potential malignancy such as lymphoma. EXAM: CT CHEST, ABDOMEN, AND PELVIS WITH CONTRAST TECHNIQUE: Multidetector CT imaging of the chest, abdomen and pelvis was performed following the standard protocol during bolus administration of intravenous contrast. CONTRAST:  1060mOMNIPAQUE IOHEXOL 300 MG/ML  SOLN COMPARISON:  Chest CT 01/07/2014. CT the abdomen and pelvis 11/24/2012. FINDINGS: CT CHEST FINDINGS Cardiovascular: Heart size is normal. There is no significant pericardial fluid, thickening or pericardial calcification. There is aortic atherosclerosis, as well as atherosclerosis of the great vessels of the mediastinum and the coronary arteries, including calcified atherosclerotic plaque in the left main, left anterior descending and right coronary arteries. Mediastinum/Nodes: No pathologically enlarged mediastinal or hilar lymph nodes. Esophagus is unremarkable in appearance. No axillary lymphadenopathy.  Lungs/Pleura: Moderate bilateral pleural effusions lying dependently with areas of passive atelectasis in the lower lobes of the lungs bilaterally. Patchy areas of very mild ground-glass attenuation are noted in the lungs bilaterally, nonspecific. No definite consolidative airspace disease. No suspicious appearing pulmonary nodules or masses are noted. Musculoskeletal: There are no aggressive appearing lytic or blastic lesions noted in the visualized portions of the  skeleton. CT ABDOMEN PELVIS FINDINGS Hepatobiliary: No suspicious cystic or solid hepatic lesions. No intra or extrahepatic biliary ductal dilatation. Diffuse gallbladder wall edema. No calcified gallstones. No overt pericholecystic inflammatory changes. Pancreas: No pancreatic mass. No pancreatic ductal dilatation. No pancreatic or peripancreatic fluid collections or inflammatory changes. Spleen: Unremarkable. Adrenals/Urinary Tract: Spleen measures up to 12.9 x 4.6 x 11.5 cm (estimated splenic volume of 341 mL), which is upper limits of normal, and is unremarkable in appearance. Stomach/Bowel: Normal appearance of the stomach. No pathologic dilatation of small bowel or colon. A few scattered colonic diverticulae are noted, particularly in the sigmoid colon, without surrounding inflammatory changes to suggest an acute diverticulitis at this time. Normal appendix. Vascular/Lymphatic: Aortic atherosclerosis, without evidence of aneurysm or dissection in the abdominal or pelvic vasculature. No lymphadenopathy noted in the abdomen or pelvis. Reproductive: Uterus and ovaries are unremarkable in appearance. Other: Small volume of ascites, most evident adjacent to the liver. No pneumoperitoneum. Musculoskeletal: There are no aggressive appearing lytic or blastic lesions noted in the visualized portions of the skeleton. IMPRESSION: 1. No definite evidence of lymphoma or other malignancy. 2. Moderate bilateral pleural effusions lying dependently with extensive passive atelectasis in the lower lobes of the lungs bilaterally. 3. Trace volume of ascites. 4. Gallbladder wall edema, nonspecific in the setting of small volume of ascites. No definite gallstones. Gallbladder does not appear distended, nor are there are surrounding pericholecystic inflammatory changes to suggest an acute cholecystitis at this time. 5. Aortic atherosclerosis, in addition to left main and 2 vessel coronary artery disease. Assessment for potential risk  factor modification, dietary therapy or pharmacologic therapy may be warranted, if clinically indicated. 6. Colonic diverticulosis without evidence of acute diverticulitis at this time. Electronically Signed   By: Vinnie Langton M.D.   On: 02/09/2019 17:50   US RENAL  Result Date: 02/24/2019 CLINICAL DATA:  Acute kidney injury EXAM: RENAL / URINARY TRACT ULTRASOUND COMPLETE COMPARISON:  None. FINDINGS: Right Kidney: Renal measurements: 11.1 x 4.3 x 5 cm = volume: 123.2 mL . Echogenicity within normal limits. There is a 2.6 cm interpolar region cyst. No hydronephrosis visualized. Left Kidney: Renal measurements: 11.9 x 5.8 x 5 cm = volume: 177.3 mL. Echogenicity within normal limits. No mass or hydronephrosis visualized. Bladder: Appears normal for degree of bladder distention. Other: Minimal perinephric fluid. IMPRESSION: No hydronephrosis.  Minimal perinephric fluid. Electronically Signed   By: Macy Mis M.D.   On: 02/24/2019 09:50   DG CHEST PORT 1 VIEW  Result Date: 02/07/2019 CLINICAL DATA:  Shortness of breath EXAM: PORTABLE CHEST 1 VIEW COMPARISON:  February 24, 2013 FINDINGS: No pneumothorax. The heart, hila, and mediastinum are normal. Left lung is clear. Haziness over the right base is identified, asymmetric to the left. IMPRESSION: Haziness over the right base, asymmetric to the left, could represent atelectasis or developing infiltrate. A PA and lateral chest x-ray may better evaluate. No other acute abnormalities identified. Electronically Signed   By: Dorise Bullion III M.D   On: 02/07/2019 12:52   CT BONE MARROW BIOPSY & ASPIRATION  Result Date: 02/26/2019 INDICATION: Hypercalcemia of uncertain  etiology. Please perform CT-guided bone marrow biopsy for tissue diagnostic purposes. EXAM: CT-GUIDED BONE MARROW BIOPSY AND ASPIRATION MEDICATIONS: None ANESTHESIA/SEDATION: Fentanyl 100 mcg IV; Versed 2 mg IV Sedation Time: 10 Minutes; The patient was continuously monitored during the  procedure by the interventional radiology nurse under my direct supervision. COMPLICATIONS: None immediate. PROCEDURE: Informed consent was obtained from the patient following an explanation of the procedure, risks, benefits and alternatives. The patient understands, agrees and consents for the procedure. All questions were addressed. A time out was performed prior to the initiation of the procedure. The patient was positioned prone and non-contrast localization CT was performed of the pelvis to demonstrate the iliac marrow spaces. The operative site was prepped and draped in the usual sterile fashion. Under sterile conditions and local anesthesia, a 22 gauge spinal needle was utilized for procedural planning. Next, an 11 gauge coaxial bone biopsy needle was advanced into the left iliac marrow space. Needle position was confirmed with CT imaging. Initially, a bone marrow aspiration was performed. Next, a bone marrow biopsy was obtained with the 11 gauge outer bone marrow device. Samples were prepared with the cytotechnologist and deemed adequate. The needle was removed and superficial hemostasis was obtained with manual compression. A dressing was applied. The patient tolerated the procedure well without immediate post procedural complication. IMPRESSION: Successful CT guided left iliac bone marrow aspiration and core biopsy. Electronically Signed   By: Sandi Mariscal M.D.   On: 02/26/2019 09:48   DG HIP UNILAT WITH PELVIS 2-3 VIEWS LEFT  Result Date: 02/23/2019 CLINICAL DATA:  Pain in the left hip since 02/12/2019. No known injury. EXAM: DG HIP (WITH OR WITHOUT PELVIS) 2-3V LEFT COMPARISON:  CT 02/09/2019 FINDINGS: No evidence of fracture or focal lesion on the left. No joint space narrowing. Chronic calcification anterior to the greater trochanter on the right relates to chronic tendinosis of the gluteal insertion. No other pelvic finding. IMPRESSION: No abnormality seen to explain left hip pain. Electronically  Signed   By: Nelson Chimes M.D.   On: 02/23/2019 10:59     Subjective: She came from [facility] biopsy. She is still having pain in her thigh.  Still feels weak..  Discussed again Parkinson medications, plan to continue with current dose and to follow up with her neurology.  Discussed water intake, calcium intake limitation,   Discharge Exam: Vitals:   02/26/19 0917 02/26/19 0920  BP:    Pulse: 87 86  Resp:    Temp:    SpO2: 100% 100%     General: Pt is alert, awake, not in acute distress Cardiovascular: RRR, S1/S2 +, no rubs, no gallops Respiratory: CTA bilaterally, no wheezing, no rhonchi Abdominal: Soft, NT, ND, bowel sounds + Extremities: no edema, no cyanosis    The results of significant diagnostics from this hospitalization (including imaging, microbiology, ancillary and laboratory) are listed below for reference.     Microbiology: No results found for this or any previous visit (from the past 240 hour(s)).   Labs: BNP (last 3 results) No results for input(s): BNP in the last 8760 hours. Basic Metabolic Panel: Recent Labs  Lab 02/22/19 1933 02/23/19 0150 02/24/19 0337 02/25/19 0935 02/26/19 0219  NA 136 134* 134* 135 137  K 3.0* 3.1* 3.8 3.2* 3.1*  CL 102 102 104 105 108  CO2 '24 23 22 22 ' 21*  GLUCOSE 151* 100* 102* 129* 104*  BUN '21 20 18 11 11  ' CREATININE 1.95* 1.75* 1.78* 1.36* 1.31*  CALCIUM 11.2* 11.5* 11.4*  10.4* 10.2  MG 1.7  --   --   --   --    Liver Function Tests: Recent Labs  Lab 02/22/19 1933 02/24/19 0337  AST 24 23  ALT 6 8  ALKPHOS 68 65  BILITOT 0.8 0.2*  PROT 6.5 5.9*  ALBUMIN 2.9* 2.7*   No results for input(s): LIPASE, AMYLASE in the last 168 hours. No results for input(s): AMMONIA in the last 168 hours. CBC: Recent Labs  Lab 02/22/19 1933 02/24/19 0337 02/25/19 0935 02/26/19 0219  WBC 5.8 5.9 5.4 4.8  NEUTROABS 4.2  --   --  3.3  HGB 12.1 11.6* 12.2 11.2*  HCT 36.2 35.0* 36.6 33.5*  MCV 88.1 87.1 86.7 86.6  PLT 201  176 196 168   Cardiac Enzymes: No results for input(s): CKTOTAL, CKMB, CKMBINDEX, TROPONINI in the last 168 hours. BNP: Invalid input(s): POCBNP CBG: No results for input(s): GLUCAP in the last 168 hours. D-Dimer No results for input(s): DDIMER in the last 72 hours. Hgb A1c No results for input(s): HGBA1C in the last 72 hours. Lipid Profile No results for input(s): CHOL, HDL, LDLCALC, TRIG, CHOLHDL, LDLDIRECT in the last 72 hours. Thyroid function studies No results for input(s): TSH, T4TOTAL, T3FREE, THYROIDAB in the last 72 hours.  Invalid input(s): FREET3 Anemia work up Recent Labs    02/26/19 0219  VITAMINB12 335   Urinalysis    Component Value Date/Time   COLORURINE STRAW (A) 02/24/2019 1400   APPEARANCEUR CLEAR 02/24/2019 1400   LABSPEC 1.006 02/24/2019 1400   PHURINE 6.0 02/24/2019 1400   GLUCOSEU NEGATIVE 02/24/2019 1400   GLUCOSEU NEGATIVE 07/07/2014 1225   HGBUR SMALL (A) 02/24/2019 1400   BILIRUBINUR NEGATIVE 02/24/2019 1400   KETONESUR NEGATIVE 02/24/2019 1400   PROTEINUR NEGATIVE 02/24/2019 1400   UROBILINOGEN 0.2 07/07/2014 1225   NITRITE NEGATIVE 02/24/2019 1400   LEUKOCYTESUR NEGATIVE 02/24/2019 1400   Sepsis Labs Invalid input(s): PROCALCITONIN,  WBC,  LACTICIDVEN Microbiology No results found for this or any previous visit (from the past 240 hour(s)).   Time coordinating discharge: 40 minutes  SIGNED:   Elmarie Shiley, MD  [facility]

## 2019-02-26 NOTE — Plan of Care (Signed)

## 2019-02-26 NOTE — Progress Notes (Signed)
Physical Therapy Treatment Patient Details Name: Destiny Roth MRN: [id number] DOB: [birth year] Today's Date: 02/26/2019    History of Present Illness 69 year old female readmitted with hypercalcemia after followup revealed maintained hypercalcemia. Recent admit 1/6-1/14 for same. PMHx: Parkinson's disease, restrictive lung disease, impaired glucose tolerance, IBS, HTN, HLD, diverticulosis and GERD.    PT Comments    Pt in bed upon arrival of PT with spouse present. Pt agreeable to PT session with focus on mobility progression and establishment of exercise program. The pt was able to demo sig improvements in ambulation distance with supervision for safety with use of RW, but had 2 instances of L knee buckling. The pt remains limited by fatigue and generalized weakness from hospital stay. Both the pt and her husband were educated in [place] for LE strengthening and verbalized good understanding. The pt and her husband were also educated in stair technique, and declined need to practice, but verbalized good understanding. The pt will continue to benefit from skilled PT to maximize return to prior level of function and independence.    Follow Up Recommendations  Home health PT;Supervision - Intermittent     Equipment Recommendations  None recommended by PT    Recommendations for Other Services       Precautions / Restrictions Precautions Precautions: Fall Restrictions Weight Bearing Restrictions: No    Mobility  Bed Mobility Overal bed mobility: Modified Independent Bed Mobility: Supine to Sit     Supine to sit: HOB elevated Sit to supine: Supervision   General bed mobility comments: pt able to transition to sittine EOB and reposition in bed with use of BUE and BLE, no assist needed. extra time  Transfers Overall transfer level: Needs assistance Equipment used: Rolling walker (2 wheeled) Transfers: Sit to/from Stand Sit to Stand: Supervision         General transfer  comment: supervision for safety  Ambulation/Gait Ambulation/Gait assistance: Min guard Gait Distance (Feet): 100 Feet(chair follow for safety, not needed) Assistive device: Rolling walker (2 wheeled) Gait Pattern/deviations: Step-through pattern;Decreased stride length Gait velocity: decreased Gait velocity interpretation: 1.31 - 2.62 ft/sec, indicative of limited community ambulator General Gait Details: pt with good use of RW requested chair follow for fatigue, ended up not needing seated rest. Pt with 2 instances of L knee buckling, able to recover with use of BUE, no assist needed   Stairs             Wheelchair Mobility    Modified Rankin (Stroke Patients Only)       Balance Overall balance assessment: History of Falls Sitting-balance support: Bilateral upper extremity supported Sitting balance-Leahy Scale: Good Sitting balance - Comments: no UE support EOB supervision   Standing balance support: Bilateral upper extremity supported;During functional activity Standing balance-Leahy Scale: Fair Standing balance comment: able to stand statically to pull up underwear and pants without UE support, reliant on BUE support for ambulation                            Cognition Arousal/Alertness: Awake/alert Behavior During Therapy: Flat affect Overall Cognitive Status: Within Functional Limits for tasks assessed Area of Impairment: Problem solving                             Problem Solving: Requires verbal cues General Comments: pt oriented to day and able to provide accurate information regarding sequence of events with function. Reports fatigue limiting  function      Exercises      General Comments        Pertinent Vitals/Pain Pain Assessment: 0-10 Pain Score: 5  Pain Location: mid/upper back Pain Descriptors / Indicators: Sore Pain Intervention(s): Limited activity within patient's tolerance;Monitored during session    Home Living                       Prior Function            PT Goals (current goals can now be found in the care plan section) Acute Rehab PT Goals Patient Stated Goal: return home PT Goal Formulation: With patient/family Time For Goal Achievement: 03/11/19 Potential to Achieve Goals: Good Progress towards PT goals: Progressing toward goals    Frequency    Min 3X/week      PT Plan Current plan remains appropriate    Co-evaluation              AM-PAC PT "6 Clicks" Mobility   Outcome Measure  Help needed turning from your back to your side while in a flat bed without using bedrails?: None Help needed moving from lying on your back to sitting on the side of a flat bed without using bedrails?: None Help needed moving to and from a bed to a chair (including a wheelchair)?: A Little Help needed standing up from a chair using your arms (e.g., wheelchair or bedside chair)?: A Little Help needed to walk in hospital room?: A Little Help needed climbing 3-5 steps with a railing? : A Little 6 Click Score: 20    End of Session Equipment Utilized During Treatment: Gait belt Activity Tolerance: Patient tolerated treatment well Patient left: in chair;with call bell/phone within reach;with chair alarm set Nurse Communication: Mobility status PT Visit Diagnosis: Other abnormalities of gait and mobility (R26.89)     Time: 1050-1120 PT Time Calculation (min) (ACUTE ONLY): 30 min  Charges:  $Gait Training: 8-22 mins $Self Care/Home Management: 8-22                     Karma Ganja, PT, DPT   Acute Rehabilitation Department Pager #: [phone]   Otho Bellows 02/26/2019, 11:26 AM

## 2019-02-26 NOTE — Discharge Instructions (Signed)
-  Please follow up with Dr. Buddy Duty in 1 week, you will need lab work to check calcium level and kidney function.  -Dr Burr Medico will call you with results of Bone Marrow.  - Drink plenty of water, at least 5 to 8 glasses of water.  -limit calcium intake to less than 1000 mg a day.

## 2019-02-26 NOTE — Progress Notes (Signed)
PT Cancellation Note  Patient Details Name: Destiny Roth MRN: [id number] DOB: 12/17/1950   Cancelled Treatment:    Reason Eval/Treat Not Completed: Patient at procedure or test/unavailable. Per RN Leatrice Jewels, pt is off floor for bone marrow biopsy this morning, PT will continue to follow and treat as time/schedule allow.   Karma Ganja, PT, DPT   Acute Rehabilitation Department Pager #: [phone]   Otho Bellows 02/26/2019, 8:41 AM

## 2019-02-26 NOTE — TOC Transition Note (Signed)
Transition of Care [facility]) - CM/SW Discharge Note   Patient Details  Name: Destiny Roth MRN: [id number] Date of Birth: 05-18-1950  Transition of Care [facility]) CM/SW Contact:  Alexander Mt, [place] Phone Number: 02/26/2019, 10:29 AM   Clinical Narrative:    Per morning report, pt for discharge potentially today. [facility] notified, orders for resumption and face to face for Medicare have been completed. Per Nira Conn, RNCM note on 1/27 pt has all needed DME. RN knows to contact CSW should additional needs arise.    Final next level of care: [place] Barriers to Discharge: Continued Medical Work up   Patient Goals and CMS Choice Patient states their goals for this hospitalization and ongoing recovery are:: to return to home CMS Medicare.gov Compare Post Acute Care list provided to:: Patient Choice offered to / list presented to : Patient  Discharge Placement Patient to be transferred to facility by: personal car home Name of family member notified: pt responsible for self; will notify family Patient and family notified of of transfer: 02/26/19  Discharge Plan and Services Discharge Planning Services: CM Consult Post Acute Care Choice: Home Health          DME Arranged: N/A HH Arranged: RN, PT, OT, Nurse's Aide [place] Agency: [place]) Date HH Agency Contacted: 02/24/19 Time [place]: 1537 Representative spoke with at [place]: [place] Flow  Readmission Risk Interventions No flowsheet data found.

## 2019-02-26 NOTE — Progress Notes (Signed)
[facility] ROUNDING NOTE   Subjective:   69 year old lady with a history of Parkinson disease hypertension restrictive lung disease and chronic anxiety.  She was hospitalized for hypercalcemia in January 2021 with a thorough work-up including a CT scan that was negative for malignancy.  She had an SPEP performed 02/03/2019 with a normal kappa/lambda light chain.  She had lab work performed by endocrinology 02/23/2019 revealed an increase in her creatinine to 1.88 and the calcium level 12.2.  The rising calcium as well as acute kidney injury precipitated a admission to the hospital.  She underwent Zometa 4 mg IV 02/22/2019.  A baseline serum creatinine 0.96 02/10/2019.  Her IV fluids were changed to normal saline at 125 cc an hour noted to promote calcium diuresis.  Patient is scheduled for bone marrow biopsy appreciate assistance from hematology oncology and interventional radiology   Blood pressure 144/72 pulse 82 temperature 97.7 O2 sats 90% room air  Sodium 137 potassium 3.1 chloride 108 CO2 21 BUN 11 creatinine 1.3 glucose 104 calcium 10.2 RBC 4.8 hemoglobin 11.2 platelets 162  Urine output 2 0.2 L 02/25/2019  Amantadine 100 mg amlodipine 10 mg daily aspirin 81 mg daily Sinemet IR 25-100 mg 3 times daily, Sinemet CR 25-100 mg 1 tablet twice daily, potassium chloride SA 40 mEq every 4 hours, Requip 1 mg 3 times daily reduce Crestor 5 mg daily sertraline 100 mg daily  IV normal saline 125 cc an hour  Objective:  Vital signs in last 24 hours:  Temp:  [97.7 F (36.5 C)-98.6 F (37 C)] 97.7 F (36.5 C) (01/29 0408) Pulse Rate:  [83-92] 86 (01/29 0920) Resp:  [14-18] 14 (01/29 0915) BP: (124-158)/(70-79) 144/72 (01/29 0915) SpO2:  [98 %-100 %] 100 % (01/29 0920)  Weight change:  There were no vitals filed for this visit.  Intake/Output: I/O last 3 completed shifts: In: 7989 [P.O.:480; I.V.:1200] Out: 4077 [Urine:4075; Stool:2]   Intake/Output this shift:  No intake/output  data recorded. Alert nondistressed CVS-regular rate and rhythm no murmurs rubs or gallops RS-clear to auscultation no wheeze or rales ABD- BS present soft non-distended EXT- no edema   Basic Metabolic Panel: Recent Labs  Lab 02/22/19 1933 02/22/19 1933 02/23/19 0150 02/23/19 0150 02/24/19 0337 02/25/19 0935 02/26/19 0219  NA 136  --  134*  --  134* 135 137  K 3.0*  --  3.1*  --  3.8 3.2* 3.1*  CL 102  --  102  --  104 105 108  CO2 24  --  23  --  22 22 21*  GLUCOSE 151*  --  100*  --  102* 129* 104*  BUN 21  --  20  --  _0 CREATININE 1.95*  --  1.75*  --  1.78* 1.36* 1.31*  CALCIUM 11.2*   < > 11.5*   < > 11.4* 10.4* 10.2  MG 1.7  --   --   --   --   --   --    < > = values in this interval not displayed.    Liver Function Tests: Recent Labs  Lab 02/22/19 1933 02/24/19 0337  AST 24 23  ALT 6 8  ALKPHOS 68 65  BILITOT 0.8 0.2*  PROT 6.5 5.9*  ALBUMIN 2.9* 2.7*   No results for input(s): LIPASE, AMYLASE in the last 168 hours. No results for input(s): AMMONIA in the last 168 hours.  CBC: Recent Labs  Lab 02/22/19 1933 02/24/19 2119 02/25/19 0935  02/26/19 0219  WBC 5.8 5.9 5.4 4.8  NEUTROABS 4.2  --   --  3.3  HGB 12.1 11.6* 12.2 11.2*  HCT 36.2 35.0* 36.6 33.5*  MCV 88.1 87.1 86.7 86.6  PLT 201 176 196 168    Cardiac Enzymes: No results for input(s): CKTOTAL, CKMB, CKMBINDEX, TROPONINI in the last 168 hours.  BNP: Invalid input(s): POCBNP  CBG: No results for input(s): GLUCAP in the last 168 hours.  Microbiology: Results for orders placed or performed during the hospital encounter of 02/03/19  SARS CORONAVIRUS 2 (TAT 6-24 HRS) Nasopharyngeal Nasopharyngeal Swab     Status: None   Collection Time: 02/03/19  2:08 PM   Specimen: Nasopharyngeal Swab  Result Value Ref Range Status   SARS Coronavirus 2 NEGATIVE NEGATIVE Final    Comment: (NOTE) SARS-CoV-2 target nucleic acids are NOT DETECTED. The SARS-CoV-2 RNA is generally detectable in  upper and lower respiratory specimens during the acute phase of infection. Negative results do not preclude SARS-CoV-2 infection, do not rule out co-infections with other pathogens, and should not be used as the sole basis for treatment or other patient management decisions. Negative results must be combined with clinical observations, patient history, and epidemiological information. The expected result is Negative. Fact Sheet for Patients: SugarRoll.be Fact Sheet for Healthcare Providers: https://www.woods-mathews.com/ This test is not yet approved or cleared by the [place] FDA and  has been authorized for detection and/or diagnosis of SARS-CoV-2 by FDA under an Emergency Use Authorization (EUA). This EUA will remain  in effect (meaning this test can be used) for the duration of the COVID-19 declaration under Section 56 4(b)(1) of the Act, 21 U.S.C. section 360bbb-3(b)(1), unless the authorization is terminated or revoked sooner. Performed at [facility], [place] [address] [place], [place] [address]   Aerobic/Anaerobic Culture (surgical/deep wound)     Status: None   Collection Time: 02/07/19 11:03 AM   Specimen: Vaginal  Result Value Ref Range Status   Specimen Description VAGINA  Final   Special Requests LOOKING FOR YEAST  Final   Gram Stain   Final    NO WBC SEEN ABUNDANT GRAM POSITIVE RODS MODERATE GRAM POSITIVE COCCI IN PAIRS IN CLUSTERS FEW GRAM NEGATIVE RODS Performed at [facility], [employer] [address], [place], [place] [address]    Culture   Final    FEW ESCHERICHIA COLI WITHIN MIXED FLORA MIXED ANAEROBIC FLORA PRESENT.  CALL LAB IF FURTHER IID REQUIRED.    Report Status 02/11/2019 FINAL  Final   Organism ID, Bacteria ESCHERICHIA COLI  Final      Susceptibility   Escherichia coli - MIC*    AMPICILLIN 4 SENSITIVE Sensitive     CEFAZOLIN <=4 SENSITIVE Sensitive     CEFEPIME <=0.12 SENSITIVE Sensitive      CEFTAZIDIME <=1 SENSITIVE Sensitive     CEFTRIAXONE <=0.25 SENSITIVE Sensitive     CIPROFLOXACIN <=0.25 SENSITIVE Sensitive     GENTAMICIN <=1 SENSITIVE Sensitive     IMIPENEM <=0.25 SENSITIVE Sensitive     TRIMETH/SULFA <=20 SENSITIVE Sensitive     AMPICILLIN/SULBACTAM <=2 SENSITIVE Sensitive     PIP/TAZO <=4 SENSITIVE Sensitive     * FEW ESCHERICHIA COLI    Coagulation Studies: No results for input(s): LABPROT, INR in the last 72 hours.  Urinalysis: Recent Labs    02/24/19 1400  COLORURINE STRAW*  LABSPEC 1.006  PHURINE 6.0  GLUCOSEU NEGATIVE  HGBUR SMALL*  BILIRUBINUR NEGATIVE  KETONESUR NEGATIVE  PROTEINUR NEGATIVE  NITRITE  NEGATIVE  LEUKOCYTESUR NEGATIVE      Imaging: NM Bone Scan Whole Body  Result Date: 02/24/2019 CLINICAL DATA:  Progressive renal insufficiency and hypercalcemia. Concern for occult neoplasm. EXAM: NUCLEAR MEDICINE WHOLE BODY BONE SCAN TECHNIQUE: Whole body anterior and posterior images were obtained approximately 3 hours after intravenous injection of radiopharmaceutical. RADIOPHARMACEUTICALS:  22.0 mCi Technetium-38mMDP IV COMPARISON:  CTs of the chest, abdomen and pelvis 02/09/2019. CT head 02/05/2019. FINDINGS: There is no osseous uptake suspicious for metastatic disease or primary osseous malignancy. Nasal activity is likely inflammatory without clear correlate on recent head CT. There is probable periodontal disease within the maxilla. Scattered arthropathic activity is present in the shoulders, elbows, wrists, knees, ankles and 1st MTP joints. Soft tissue activity in the right forearm is attributed to the injection. Mild urine contamination in the perineal region. The soft tissue activity is otherwise unremarkable. IMPRESSION: 1. No evidence of osseous metastatic disease or primary bone malignancy. 2. Scattered mild arthropathic activity. Electronically Signed   By: WRichardean SaleM.D.   On: 02/24/2019 17:02   [place]RENAL  Result Date:  02/24/2019 CLINICAL DATA:  Acute kidney injury EXAM: RENAL / URINARY TRACT ULTRASOUND COMPLETE COMPARISON:  None. FINDINGS: Right Kidney: Renal measurements: 11.1 x 4.3 x 5 cm = volume: 123.2 mL . Echogenicity within normal limits. There is a 2.6 cm interpolar region cyst. No hydronephrosis visualized. Left Kidney: Renal measurements: 11.9 x 5.8 x 5 cm = volume: 177.3 mL. Echogenicity within normal limits. No mass or hydronephrosis visualized. Bladder: Appears normal for degree of bladder distention. Other: Minimal perinephric fluid. IMPRESSION: No hydronephrosis.  Minimal perinephric fluid. Electronically Signed   By: PMacy MisM.D.   On: 02/24/2019 09:50     Medications:   . sodium chloride 125 mL/hr at 02/26/19 0528  . sodium chloride 10 mL/hr (02/26/19 0814)   . acetaminophen  1,000 mg Oral TID   Or  . acetaminophen  650 mg Rectal TID  . amantadine  100 mg Oral Daily  . amLODipine  10 mg Oral Daily  . aspirin  81 mg Oral Daily  . carbidopa-levodopa  1 tablet Oral TID  . Carbidopa-Levodopa ER  1 tablet Oral BID  . enoxaparin (LOVENOX) injection  40 mg Subcutaneous Q24H  . feeding supplement  1 Container Oral TID BM  . fentaNYL      . midazolam      . potassium chloride  40 mEq Oral Q4H  . rOPINIRole  1 mg Oral TID  . rosuvastatin  5 mg Oral QPM  . senna  1 tablet Oral BID  . sertraline  100 mg Oral Daily  . vitamin B-12  100 mcg Oral Daily   sodium chloride, fentaNYL, midazolam, traMADol  Assessment/ Plan:   Acute kidney injury with an increase in serum creatinine 1.78 since discharge 02/11/2019.  No evidence of any etiology.  There was no use of nephrotoxins no ACE inhibitor is no ARB is no use nonsteroidal anti-inflammatories no Cox 2 inhibitors.  There was the administration of IV contrast 02/09/2019.  She has good urine output with 2 L diuresed 02/24/2019.  Urinalysis negative for protein.  0-5 RBCs 0-5 WBCs.  This appears to be a post ATN diuresis.  I would imagine secondary  to administration of IV contrast possibly related to some transient hypotension.  Creatinine returning back to baseline  Hypertension/volume appears adequately controlled has been successfully volume resuscitated.  Continues on IV normal saline at 125 cc an hour for diuresis.  Will discontinue normal saline today 02/26/2019  Hypokalemia repleted as per primary service  Hypercalcemia.  The etiology of hypercalcemia has been elusive so far.  Marland Kitchen  Appreciate the assistance of Sherren Kerns with clinical pharmacy who did a thorough review of medications 02/24/2019.  ACE level was elevated.  This can be seen in sarcoidosis.  Although this would be a little unusual not see anything on her chest CT.  Bone scan showed no evidence of metastatic disease.  We will discontinue normal saline.  Bone marrow biopsy pending.  Calcium seems to be improving.  This is a very interesting lady however do not think there it represents a significant renal issue I will sign off on her today 02/26/2019 thank you very much for consult   LOS: [place] _0 _1 :33 AM

## 2019-02-26 NOTE — Procedures (Signed)
Pre-procedure Diagnosis: Hypercalcemia of uncertain etiology Post-procedure Diagnosis: Same  Technically successful CT guided bone marrow aspiration and biopsy of left iliac crest.   Complications: None Immediate  EBL: None  Signed: Sandi Mariscal Pager: [phone] 02/26/2019, 9:21 AM

## 2019-02-26 NOTE — Progress Notes (Addendum)
Brief Nutrition Follow-Up Note  RD working remotely.  Paged by RN, who reports pt is being discharged today and is MD requesting RD speak with pt prior to discharge (noted RD made multiple attempts to speak with pt yesterday; see noted date 02/25/19 for further details).   Spoke with pt over the phone, who was pleasant and in good spirits today. She endorses poor appetite, which has been declining over the past several months. She estimates she has been consuming less than 50% of meals here in the hospital. PTA pt reports her appetite was a little better, but was usually only consuming 2 meals per day (was offered 3 per day by husband, who encouraged her to eat). Pt shared that her daughter from [place] came up earlier this month to prepare meals for her. She endorses weight loss secondary to poor oral intake.   Per pt, typical meal intake consisted of Breakfast: toast and scrambled egg; Lunch: tuna noodle casserole OR ribs and potatoes with peas OR tortellini soup; Dinner: bowl of dry honey nut cheerios. Commonly consumed beverages include water, juice, and skim milk (consumes one glass per day on average).   Pt reports that she was taking calcium supplements vitamin D3 and took tums and rolaids on occasion. However, she discontinued these medications approximately 3 weeks ago per her report.   RD discussed common dietary sources of calcium in foods and over the counter medications. Encouraged pt to discontinue use of tums/rolaids. Pt had other questions regarding other medications she should take. Informed pt that a thorough review of medications was completed by pharmacy and RN would provide list of medications to continue on discharge.    Given diet recall, do not think hypercalcemia is related to nutritional cause. Also pt with poor oral intake and weight loss so would not recommend pt further restrict diet to promote adequate oral intake. RD reviewed case with renal specialist RD on RD team,  who also agreed with this RD's assessment.   Pt expressed frustration over current lack of defined cause of hypercalcemia, but very appreciative of RD input and time. Pt eager for discharge home as well as bone marrow biopsy results.   Pt for discharge today. If further nutritional needs arise, please reconsult RD.   Rudene Poulsen A. Jimmye Norman, RD, LDN, [place] Registered Dietitian II Certified Diabetes Care and Education Specialist Pager: [phone] After hours Pager: [phone]

## 2019-03-03 ENCOUNTER — Telehealth: Payer: Self-pay

## 2019-03-03 NOTE — Telephone Encounter (Signed)
Destiny Roth left vm asking about bone marrow biopsy results from hospital.    She does not have a follow up appointment scheduled with Dr. Burr Medico at this time

## 2019-03-03 NOTE — Telephone Encounter (Signed)
I called pt and discussed her bone marrow biopsy result, which is consistent with sarcoidosis. She is on prednisone now, and just daw Dr. Buddy Duty yesterday and had lab draw. I do not need to see her back. She has a f/u with pulmonologist Dr. Loanne Drilling in early March.  Santiago Glad, could you fax her bone marrow biopsy result to her endocrinologist Dr. Buddy Duty and her PCP tomorrow? And please confirm that it's received by them.  Thanks   Truitt Merle MD

## 2019-03-05 ENCOUNTER — Telehealth: Payer: Self-pay

## 2019-03-05 NOTE — Telephone Encounter (Signed)
Bone Marrow biopsy results faxed to Dr. Ailene Ravel fax  [phone] and Dr. Buddy Duty fax [phone].

## 2019-03-08 ENCOUNTER — Encounter (HOSPITAL_COMMUNITY): Payer: Self-pay | Admitting: Hematology

## 2019-03-09 LAB — SURGICAL PATHOLOGY

## 2019-04-05 ENCOUNTER — Ambulatory Visit (INDEPENDENT_AMBULATORY_CARE_PROVIDER_SITE_OTHER): Payer: Medicare Other | Admitting: Pulmonary Disease

## 2019-04-05 ENCOUNTER — Encounter: Payer: Self-pay | Admitting: Pulmonary Disease

## 2019-04-05 ENCOUNTER — Other Ambulatory Visit: Payer: Self-pay

## 2019-04-05 VITALS — BP 106/62 | HR 112 | Temp 97.1°F | Ht <= 58 in | Wt 124.4 lb

## 2019-04-05 DIAGNOSIS — D869 Sarcoidosis, unspecified: Secondary | ICD-10-CM | POA: Diagnosis not present

## 2019-04-05 DIAGNOSIS — R0602 Shortness of breath: Secondary | ICD-10-CM

## 2019-04-05 NOTE — Progress Notes (Signed)
Subjective:   PATIENT ID: Destiny Roth: female DOB: 1950-07-21, MRN: [id number]   HPI  Chief Complaint  Patient presents with  . Hospitalization Follow-up    weakness, joint pain, problem with eyes and head sensation. worried about parkinsons    Reason for Visit: Hospital follow-up  [name] is a 69 year old female with Parkinson's disease, HTN, HLD and restrictive lung disease who presents for follow-up for sarcoid management. Husband provides additional history.  She was initially seen as an inpatient Pulmonary consult by me on 02/23/19 for hypercalcemia and consideration of bronchoscopy to rule out sarcoid. However due to lack of pulmonary symptoms and normal CT at the time, bronchoscopy thought to be low-yield. I have reviewed discharge note from 02/26/19 by Dr. Tyrell Antonio. Bone biopsy was performed and demonstrated granulomas disease without necrosis. She was started on steroids and discharged with Endocrine and Pulmonary follow-up.  Since discharge, she has been on prednisone 20 mg x 1 month. She is currently on prednisone 10 mg that she started one week ago. She does report that the prednisone causes some insomnia. Of note, she was previously seen in our Pulmonary clinic in 2018 for dyspnea and mild sleep apnea. Her prior PFTs demonstrate mild restrictive defect with normal DLCO which patient stated was attributed to her Parkinson's disease. Although she did not have shortness of breath when I visited her in the hospital she does state that she has some mild dyspnea on exertion. No cough, wheeze or chest pain associated. In general she has had chronic weakness which has worsened in the setting of her hypercalcemia and that was noted to be associated with muscle cramping. She also reports changes in her vision that include floaters and difficulty adjusting to light changes that "feels like blindness" that lasts for 15-30 seconds  Social History: Never  smoker  I have personally reviewed patient's past medical/family/social history, allergies, current medications.  Past Medical History:  Diagnosis Date  . Abdominal pain, epigastric 09/18/2009  . Anosmia 11/06/2010  . ANXIETY 09/23/2006  . BUNION, RIGHT FOOT 09/18/2009  . CARPAL TUNNEL SYNDROME, BILATERAL 09/23/2006  . DEPRESSION 09/23/2006  . DIVERTICULOSIS, COLON 09/23/2006  . GERD 05/24/2008  . GLUCOSE INTOLERANCE 05/24/2008  . Hemorrhoids   . Hypercalcemia   . HYPERLIPIDEMIA 09/23/2006  . HYPERTENSION 09/23/2006  . IBS 09/23/2006  . Impaired glucose tolerance 11/04/2010  . OSTEOPOROSIS 05/24/2008  . Parkinson's disease ([place])   . Restrictive lung disease 12/20/2013     Family History  Problem Relation Age of Onset  . Peripheral vascular disease Mother        with bilat amputations  . Heart attack Mother   . Hypertension Father   . Heart disease Father 73  . Hypertension Brother   . Heart attack Brother   . Hypertension Brother   . Hypertension Brother      Social History   Occupational History  . Not on file  Tobacco Use  . Smoking status: Passive Smoke Exposure - Never Smoker  . Smokeless tobacco: Never Used  Substance and Sexual Activity  . Alcohol use: Yes    Comment: Occ  . Drug use: No  . Sexual activity: Yes    Partners: Male    Birth control/protection: Post-menopausal    Allergies  Allergen Reactions  . Myrbetriq [Mirabegron] Other (See Comments)    SEVERE muscle and joint pain  . Lipitor [Atorvastatin] Other (See Comments)    Muscle pain  . Zocor [Simvastatin] Other (  See Comments)    Muscle pain     Outpatient Medications Prior to Visit  Medication Sig Dispense Refill  . predniSONE (DELTASONE) 10 MG tablet Take 10 mg by mouth daily with breakfast.    . acetaminophen (TYLENOL) 500 MG tablet Take 1,000 mg by mouth every 6 (six) hours as needed for mild pain.    . Amantadine HCl 100 MG tablet Take 1 tablet (100 mg total) by mouth daily.    Marland Kitchen amLODipine  (NORVASC) 10 MG tablet Take 1 tablet (10 mg total) by mouth daily. 30 tablet 1  . aspirin 81 MG tablet Take 81 mg by mouth daily.    . carbidopa-levodopa (SINEMET IR) 25-100 MG tablet TAKE ONE TABLET BY MOUTH THREE TIMES DAILY (Patient taking differently: Take 1 tablet by mouth See admin instructions. Take 1 tablet by mouth three times a day- 8 AM, 12 PM, and between 5-6 PM) 270 tablet 1  . Carbidopa-Levodopa ER (SINEMET CR) 25-100 MG tablet controlled release Take 1 tablet by mouth 2 (two) times daily. 60 tablet 0  . cyanocobalamin 100 MCG tablet Take 1 tablet (100 mcg total) by mouth daily. 10 tablet 0  . hydrocortisone (ANUSOL-HC) 25 MG suppository Place 1 suppository (25 mg total) rectally at bedtime as needed for hemorrhoids or itching. 100 suppository 1  . potassium chloride SA (KLOR-CON) 20 MEQ tablet Take 1 tablet (20 mEq total) by mouth daily. 3 tablet 0  . rOPINIRole (REQUIP) 1 MG tablet Take 1 tablet (1 mg total) by mouth 3 (three) times daily. (Patient taking differently: Take 1 mg by mouth See admin instructions. Take 1 mg by mouth three times a day- 8 AM, 12 PM, and between 5-6 PM) 90 tablet 0  . rosuvastatin (CRESTOR) 5 MG tablet Take 5 mg by mouth every evening.     . senna (SENOKOT) 8.6 MG TABS tablet Take 1 tablet (8.6 mg total) by mouth 2 (two) times daily. 30 tablet 0  . sertraline (ZOLOFT) 100 MG tablet Take 100 mg by mouth daily.    . predniSONE (DELTASONE) 20 MG tablet Take 1 tablet (20 mg total) by mouth daily with breakfast. 30 tablet 1   No facility-administered medications prior to visit.    Review of Systems  Constitutional: Positive for malaise/fatigue. Negative for chills, diaphoresis, fever and weight loss.  HENT: Negative for congestion, ear pain and sore throat.   Eyes: Positive for blurred vision.       Floaters, difficulty adjusting to light  Respiratory: Positive for shortness of breath. Negative for cough, hemoptysis, sputum production and wheezing.     Cardiovascular: Negative for chest pain, palpitations and leg swelling.  Gastrointestinal: Negative for abdominal pain, heartburn and nausea.  Genitourinary: Negative for frequency.  Musculoskeletal: Positive for myalgias. Negative for joint pain.  Skin: Negative for itching and rash.  Neurological: Positive for sensory change and weakness. Negative for dizziness, focal weakness and headaches.  Endo/Heme/Allergies: Does not bruise/bleed easily.  Psychiatric/Behavioral: Negative for depression. The patient is not nervous/anxious.      Objective:   Vitals:   04/05/19 1442  BP: 106/62  Pulse: (!) 112  Temp: (!) 97.1 F (36.2 C)  TempSrc: Temporal  SpO2: 98%  Weight: 124 lb 6.4 oz (56.4 kg)  Height: '4\' 10"'  (1.473 m)   SpO2: 98 % O2 Device: None (Room air)  Physical Exam: General: Well-appearing, no acute distress HENT: [place], AT Eyes: EOMI, no scleral icterus Respiratory: Clear to auscultation bilaterally.  No crackles, wheezing or  rales Cardiovascular: RRR, -M/R/G, no JVD GI: BS+, soft, nontender Extremities:-Edema,-tenderness Neuro: AAO x4, CNII-XII grossly intact Skin: Intact, no rashes or bruising Psych: Normal mood, normal affect  Data Reviewed:  Imaging: CXR 02/23/19 - interval resolutions of bilateral pleural effusions  CT Chest 02/09/19 - Bilateral pleural effusions. Mild ground glass opacifications bilaterally. No enlarged mediastinal or hilar adenopathy.  PFT: 12/17/13 FVC 2.26 (86%) FEV1 1.88 (93%) Ratio 78  TLC 70% DLCO 75% Interpretation: Mild restrictive defect with mild reduction in gas exchange (uncorrected DLCO)  Labs: 03/02/19 Calcium 10.6 (H)     Assessment & Plan:   Discussion: 69 year old female with Parkinson's disease and newly diagnosed extrapulmonary sarcoidosis found in the bone who presents for sarcoid management. She was initially seen as an inpatient consult in 01/2019 for hypercalcemia and discharged on a steroid taper. She will need  PFTs and an eye exam due to her symptoms. We discussed the clinical course of sarcoid and potential treatments needed in the future including steroids and non-steroid options.  Extrapulmonary sarcoidosis  --Dx in via bone bx 02/26/19 Normocellular marrow with noncaseating granulomatous inflammation --CONTINUE prednisone 10 mg daily --Obtain baseline PFTs.  Last PFT in 2015 demonstrated restrictive defect thought to be related to Parkinson's but represent some contribution of sarcoid. Will check for progression of lung issues. --EKG reviewed. PR interval is prolonged. Will order echocardiogram  --Recommend annual ophthalmology exam.  Last visit on 2019 --QuantiFERON-TB ordered  Hypercalcemia secondary to calcemia --Continue prednisone 10 mg daily --Obtain CMP. Will need CBC and CMP q 3 months to monitor cell counts and LFTs  Shortness of breath --PFTs as above --Continue prednisone   Parkinson's disease --KEEP your follow-up with Neurologist to discuss medications with today's kidney labs --KEEP follow-up with Neuro Ophthalmology to evaluate in setting of sarcoid and Parkinson's. Patient reports intermittent loss of vision with light changes within the last couple of months.  Health Maintenance Immunization History  Administered Date(s) Administered  . Influenza, High Dose Seasonal PF 11/30/2015, 11/16/2016, 10/29/2017, 10/30/2018  . Influenza, Seasonal, Injecte, Preservative Fre 10/20/2012  . Influenza,inj,Quad PF,6+ Mos 11/18/2013  . Influenza-Unspecified 11/18/2013, 11/30/2014, 10/29/2017, 10/29/2018  . Moderna SARS-COVID-2 Vaccination 03/12/2019  . Pneumococcal Conjugate-13 01/01/2013  . Pneumococcal Polysaccharide-23 01/05/2014  . Tdap 11/06/2010  . Zoster 11/06/2010  . Zoster Recombinat (Shingrix) 01/10/2018, 03/31/2018   CT Lung Screen - not qualified  Orders Placed This Encounter  Procedures  . Comp Met (CMET)    Standing Status:   Future    Number of Occurrences:   1     Standing Expiration Date:   04/04/2020  . QuantiFERON-TB Gold Plus  . ECHOCARDIOGRAM COMPLETE    Standing Status:   Future    Standing Expiration Date:   07/05/2020    Order Specific Question:   Where should this test be performed    Answer:   [facility])    Order Specific Question:   Does the patient weigh less than or greater than 250 lbs?    Answer:   Patient weighs less than 250 lbs    Order Specific Question:   Perflutren DEFINITY (image enhancing agent) should be administered unless hypersensitivity or allergy exist    Answer:   Administer Perflutren    Order Specific Question:   Reason for exam-Echo    Answer:   Dyspnea  786.09 / R06.00  . Pulmonary function test    Standing Status:   Future    Standing Expiration Date:  04/04/2020    Order Specific Question:   Where should this test be performed?    Answer:   [facility]    Order Specific Question:   Full PFT: includes the following: basic spirometry, spirometry pre & post bronchodilator, diffusion capacity (DLCO), lung volumes    Answer:   Full PFT  No orders of the defined types were placed in this encounter.   Return in about 22 days (around 04/27/2019).  I have spent a total time of 60-minutes on the day of the appointment reviewing prior documentation, coordinating care and discussing medical diagnosis and plan with the patient/family. Imaging, labs and tests included in this note have been reviewed and interpreted independently by me.  [place], MD [facility] 04/05/2019 2:59 PM  Office Number [phone]

## 2019-04-05 NOTE — Patient Instructions (Signed)
Extrapulmonary sarcoidosis  --Dx in via bone bx 02/26/19 Normocellular marrow with noncaseating granulomatous inflammation --CONTINUE prednisone 10 mg daily --Obtain baseline PFTs.  Last PFT in 2015 demonstrated restrictive defect thought to be related to Parkinson's but represent some contribution of sarcoid. Will check for progression of lung issues. --EKG reviewed. PR interval is prolonged. Will order echocardiogram  --Recommend annual ophthalmology exam.  Last visit on 2019 --QuantiFERON-TB ordered  Hypercalcemia secondary to calcemia --Continue prednisone 10 mg daily --Obtain CMP. Will need CBC and CMP q 3 months to monitor cell counts and LFTs  Shortness of breath --PFTs as above --Continue prednisone   Parkinson's disease --KEEP your follow-up with Neurologist to discuss medications with today's kidney labs --KEEP follow-up with Neuro Ophthalmology to evaluate in setting of sarcoid and Parkinson's. Patient reports intermittent loss of vision with light changes within the last couple of months.

## 2019-04-06 ENCOUNTER — Telehealth: Payer: Self-pay | Admitting: Pulmonary Disease

## 2019-04-06 DIAGNOSIS — D869 Sarcoidosis, unspecified: Secondary | ICD-10-CM

## 2019-04-06 LAB — COMPREHENSIVE METABOLIC PANEL
ALT: 9 U/L (ref 0–35)
AST: 16 U/L (ref 0–37)
Albumin: 4.3 g/dL (ref 3.5–5.2)
Alkaline Phosphatase: 50 U/L (ref 39–117)
BUN: 32 mg/dL — ABNORMAL HIGH (ref 6–23)
CO2: 26 mEq/L (ref 19–32)
Calcium: 9.8 mg/dL (ref 8.4–10.5)
Chloride: 102 mEq/L (ref 96–112)
Creatinine, Ser: 1.26 mg/dL — ABNORMAL HIGH (ref 0.40–1.20)
GFR: 41.92 mL/min — ABNORMAL LOW (ref >60.00)
Glucose, Bld: 125 mg/dL — ABNORMAL HIGH (ref 70–99)
Potassium: 4.5 mEq/L (ref 3.5–5.1)
Sodium: 136 mEq/L (ref 135–145)
Total Bilirubin: 0.3 mg/dL (ref 0.2–1.2)
Total Protein: 7.4 g/dL (ref 6.0–8.3)

## 2019-04-06 NOTE — Telephone Encounter (Signed)
Staff,  Please contact Dr. Miguel Aschoff office and leave message that we request a sooner appointment due to patient having active symptoms of intermittent blindness and floaters in the setting of recently diagnosed sarcoid.  If physician to physician correspondence is warranted, please let me know.  Rodman Pickle, M.D. [facility] 04/06/2019 5:30 PM

## 2019-04-06 NOTE — Telephone Encounter (Signed)
ATC Dr Miguel Aschoff office @ [phone] but the office closed at [facility] tomorrow

## 2019-04-06 NOTE — Telephone Encounter (Signed)
Called and spoke with pt. Pt stated she has an appt scheduled with Dr. Estill Bakes on April 26 but she was wanting to be seen sooner than that. Pt called Dr. Miguel Aschoff office to see if there were any sooner appts and they told her that they were booked until June other than pt's scheduled appt. Pt stated they told her if Dr. Loanne Drilling called, they might be able to see her sooner.  Pt wants to know if Dr. Loanne Drilling could call Dr. Miguel Aschoff office in regards to this to see if pt's appt could be moved up sooner. Dr. Loanne Drilling, please advise.

## 2019-04-07 LAB — QUANTIFERON-TB GOLD PLUS
Mitogen-NIL: >10 IU/mL
NIL: 0.03 IU/mL
QuantiFERON-TB Gold Plus: NEGATIVE
TB1-NIL: 0 IU/mL
TB2-NIL: 0 IU/mL

## 2019-04-07 NOTE — Telephone Encounter (Signed)
The correct number for Dr Estill Bakes is [phone] Called and [facility] for the triage nurse

## 2019-04-07 NOTE — Telephone Encounter (Signed)
ATC Dr. Miguel Aschoff office, the line rang several times with no one coming to the line. There was no option to leave a message.

## 2019-04-08 ENCOUNTER — Telehealth: Payer: Self-pay | Admitting: Pulmonary Disease

## 2019-04-08 NOTE — Telephone Encounter (Signed)
This is a duplicate message See [id number] message for details

## 2019-04-08 NOTE — Telephone Encounter (Signed)
Copied from [id number] duplicate message:  [place] from [facility] to Melodye Ped A  04/08/19 8:51 AM [place] returning call from [place] about patient. Please advise. CB#[phone]

## 2019-04-08 NOTE — Telephone Encounter (Signed)
[place] and spoke with Roselyn Reef   Per Roselyn Reef Dr Estill Bakes does not have any earlier availabilities and if Dr Loanne Drilling needs a sooner appt, we will need to put in an urgent referral and this will be given to Dr Estill Bakes to authorize if patient may be added on to her schedule.  These typically take 24-48 hours for decision.  Referral placed using the information Dr Loanne Drilling provided earlier in this message.  Patient is aware of the above and that we will be calling her with appt info once we hear back from Dr. Miguel Aschoff office.

## 2019-04-09 NOTE — Progress Notes (Signed)
Please update patient on results as follows: TB test negative and calcium level appropriate. Please continue steroids as prescribed until our next visit.

## 2019-04-09 NOTE — Telephone Encounter (Signed)
Chrystal called me back and states they want pt to come to their office this afternoon which has an urgent care.  They will call pt to give her time.  She asked that I fax ov note to [phone].  I called pt & made her aware they will be calling her.  They were calling her as I was talking to her.  Records faxed to attn "Urgent Care Today".

## 2019-04-09 NOTE — Telephone Encounter (Signed)
Type Date User Summary Attachment  General 04/09/2019 10:50 AM Ilona Sorrel - -  Note   [facility] triage nurse called to let me know she is working on this.  Dr Miguel Aschoff schedule is slammed and is off on Friday's.  She is checking with her head nurse to see if there is something that can be worked out so pt can be seen sooner.  She is now scheduled for 4/26 and first opening Chrystal see's is 3/26 but she wants to try to get pt seen sooner.  She will contact me once she has worked out something.

## 2019-04-21 ENCOUNTER — Encounter: Payer: Self-pay | Admitting: Internal Medicine

## 2019-04-22 ENCOUNTER — Other Ambulatory Visit: Payer: Self-pay

## 2019-04-22 ENCOUNTER — Ambulatory Visit (HOSPITAL_COMMUNITY): Payer: Medicare Other | Attending: Cardiology

## 2019-04-22 DIAGNOSIS — R0602 Shortness of breath: Secondary | ICD-10-CM | POA: Diagnosis not present

## 2019-04-27 ENCOUNTER — Other Ambulatory Visit: Payer: Medicare Other

## 2019-04-27 ENCOUNTER — Other Ambulatory Visit: Payer: Self-pay

## 2019-04-27 ENCOUNTER — Encounter: Payer: Self-pay | Admitting: Pulmonary Disease

## 2019-04-27 ENCOUNTER — Ambulatory Visit (INDEPENDENT_AMBULATORY_CARE_PROVIDER_SITE_OTHER): Payer: Medicare Other | Admitting: Pulmonary Disease

## 2019-04-27 ENCOUNTER — Telehealth: Payer: Self-pay | Admitting: Pulmonary Disease

## 2019-04-27 VITALS — BP 115/70 | HR 96 | Temp 97.2°F | Ht <= 58 in | Wt 126.6 lb

## 2019-04-27 DIAGNOSIS — D869 Sarcoidosis, unspecified: Secondary | ICD-10-CM | POA: Insufficient documentation

## 2019-04-27 DIAGNOSIS — Z5181 Encounter for therapeutic drug level monitoring: Secondary | ICD-10-CM | POA: Diagnosis not present

## 2019-04-27 DIAGNOSIS — H539 Unspecified visual disturbance: Secondary | ICD-10-CM

## 2019-04-27 DIAGNOSIS — Z79899 Other long term (current) drug therapy: Secondary | ICD-10-CM

## 2019-04-27 DIAGNOSIS — Z79631 Long term (current) use of antimetabolite agent: Secondary | ICD-10-CM

## 2019-04-27 DIAGNOSIS — Z114 Encounter for screening for human immunodeficiency virus [HIV]: Secondary | ICD-10-CM

## 2019-04-27 DIAGNOSIS — Z7289 Other problems related to lifestyle: Secondary | ICD-10-CM

## 2019-04-27 DIAGNOSIS — D849 Immunodeficiency, unspecified: Secondary | ICD-10-CM

## 2019-04-27 NOTE — Telephone Encounter (Signed)
I spoke to [facility] at [facility] lab and order was changed. The rapid HIV was not a test they could do there and pt would have to go to the hospital to get it done. Dr. Loanne Drilling agreed to change it so they could draw the test from their lab. Nothing further is needed.

## 2019-04-27 NOTE — Progress Notes (Signed)
Subjective:   PATIENT ID: Destiny Roth: female DOB: May 19, 1950, MRN: [id number]   HPI  Chief Complaint  Patient presents with  . Follow-up    sarcoidosis.  heavyness on chest with activity.    Reason for Visit: Hospital follow-up  Destiny Roth is a 69 year old female with Parkinson's disease, HTN, HLD and restrictive lung disease who presents for follow-up for sarcoid management. Husband provides additional history.  Synopsis: Initially seen as an inpatient Pulmonary consult by me on 02/23/19 for hypercalcemia and consideration of bronchoscopy to rule out sarcoid. However due to lack of pulmonary symptoms and normal CT at the time, bronchoscopy thought to be low-yield. Bone biopsy was performed and demonstrated granulomas disease without necrosis. She was started on steroids and discharged with Endocrine and Pulmonary follow-up. Of note, she was previously seen in our Pulmonary clinic in 2018 for dyspnea and mild sleep apnea. Her prior PFTs demonstrate mild restrictive defect with normal DLCO which patient stated was attributed to her Parkinson's disease.  Although she did not have shortness of breath when I visited her in the hospital she does state that she has some mild dyspnea on exertion. She also reports changes in her vision that include floaters and difficulty adjusting to light changes that "feels like blindness" that lasts for 15-30 seconds  Interval Since our last visit, she has been evaluated by [facility] for visual changes including floaters, intermittent "blindness" and was told she had "inflammation." She is scheduled for an MRI of the eye tomorrow. She is no longer having muscle cramps after discontinuing Mybertiq. She still reports unchanged fatigue. She is compliant with her Prednisone 10 mg daily. She is concerned about her sleep, increased appetite and weight gain associated with prednisone.  Social History: Never smoker  I have  personally reviewed patient's past medical/family/social history/allergies/current medications.  Past Medical History:  Diagnosis Date  . Abdominal pain, epigastric 09/18/2009  . Anosmia 11/06/2010  . ANXIETY 09/23/2006  . BUNION, RIGHT FOOT 09/18/2009  . CARPAL TUNNEL SYNDROME, BILATERAL 09/23/2006  . DEPRESSION 09/23/2006  . DIVERTICULOSIS, COLON 09/23/2006  . GERD 05/24/2008  . GLUCOSE INTOLERANCE 05/24/2008  . Hemorrhoids   . Hypercalcemia   . HYPERLIPIDEMIA 09/23/2006  . HYPERTENSION 09/23/2006  . IBS 09/23/2006  . Impaired glucose tolerance 11/04/2010  . OSTEOPOROSIS 05/24/2008  . Parkinson's disease ([place])   . Restrictive lung disease 12/20/2013      Outpatient Medications Prior to Visit  Medication Sig Dispense Refill  . acetaminophen (TYLENOL) 500 MG tablet Take 1,000 mg by mouth every 6 (six) hours as needed for mild pain.    . Amantadine HCl 100 MG tablet Take 1 tablet (100 mg total) by mouth daily.    Marland Kitchen amLODipine (NORVASC) 10 MG tablet Take 1 tablet (10 mg total) by mouth daily. 30 tablet 1  . aspirin 81 MG tablet Take 81 mg by mouth daily.    . carbidopa-levodopa (SINEMET IR) 25-100 MG tablet TAKE ONE TABLET BY MOUTH THREE TIMES DAILY (Patient taking differently: Take 1 tablet by mouth See admin instructions. Take 1 tablet by mouth three times a day- 8 AM, 12 PM, and between 5-6 PM) 270 tablet 1  . Carbidopa-Levodopa ER (SINEMET CR) 25-100 MG tablet controlled release Take 1 tablet by mouth 2 (two) times daily. 60 tablet 0  . cyanocobalamin 100 MCG tablet Take 1 tablet (100 mcg total) by mouth daily. 10 tablet 0  . hydrocortisone (ANUSOL-HC) 25 MG suppository Place 1  suppository (25 mg total) rectally at bedtime as needed for hemorrhoids or itching. 100 suppository 1  . potassium chloride SA (KLOR-CON) 20 MEQ tablet Take 1 tablet (20 mEq total) by mouth daily. 3 tablet 0  . predniSONE (DELTASONE) 10 MG tablet Take 10 mg by mouth daily with breakfast.    . rOPINIRole (REQUIP) 1 MG  tablet Take 1 tablet (1 mg total) by mouth 3 (three) times daily. (Patient taking differently: Take 1 mg by mouth See admin instructions. Take 1 mg by mouth three times a day- 8 AM, 12 PM, and between 5-6 PM) 90 tablet 0  . rosuvastatin (CRESTOR) 5 MG tablet Take 5 mg by mouth every evening.     . senna (SENOKOT) 8.6 MG TABS tablet Take 1 tablet (8.6 mg total) by mouth 2 (two) times daily. 30 tablet 0  . sertraline (ZOLOFT) 100 MG tablet Take 100 mg by mouth daily.     No facility-administered medications prior to visit.    Review of Systems  Constitutional: Positive for malaise/fatigue. Negative for chills, diaphoresis, fever and weight loss.  HENT: Negative for congestion, ear pain and sore throat.   Eyes: Positive for blurred vision.  Respiratory: Positive for shortness of breath. Negative for cough, hemoptysis, sputum production and wheezing.   Cardiovascular: Negative for chest pain, palpitations and leg swelling.  Gastrointestinal: Negative for abdominal pain, heartburn and nausea.  Genitourinary: Negative for frequency.  Musculoskeletal: Negative for joint pain and myalgias.  Skin: Negative for itching and rash.  Neurological: Negative for dizziness, weakness and headaches.  Endo/Heme/Allergies: Does not bruise/bleed easily.  Psychiatric/Behavioral: Negative for depression. The patient is not nervous/anxious.      Objective:   Vitals:   04/27/19 1409  BP: 115/70  Pulse: 96  Temp: (!) 97.2 F (36.2 C)  TempSrc: Temporal  SpO2: 95%  Weight: 126 lb 9.6 oz (57.4 kg)  Height: 4\' 10"  (1.473 m)   SpO2: 95 % O2 Device: None (Room air)  Physical Exam: General: Well-appearing, no acute distress HENT: [place], AT Eyes: EOMI, no scleral icterus Respiratory: Clear to auscultation bilaterally.  No crackles, wheezing or rales Cardiovascular: RRR, -M/R/G, no JVD GI: BS+, soft, nontender Extremities:-Edema,-tenderness Neuro: AAO x4, CNII-XII grossly intact Skin: Intact, no rashes or  bruising Psych: Normal mood, normal affect  Data Reviewed:  Imaging: CXR 02/23/19 - interval resolutions of bilateral pleural effusions  CT Chest 02/09/19 - Bilateral pleural effusions. Mild ground glass opacifications bilaterally. No enlarged mediastinal or hilar adenopathy.  PFT: 12/17/13 FVC 2.26 (86%) FEV1 1.88 (93%) Ratio 78  TLC 70% DLCO 75% Interpretation: Mild restrictive defect with mild reduction in gas exchange (uncorrected DLCO)  Echocardiogram: 04/22/19 - EF 60-65%. Grade I DD. No WMA or valvular abnormalities.  Labs: CMP Latest Ref Rng & Units 04/05/2019 02/26/2019 02/25/2019  Glucose 70 - 99 mg/dL 125(H) 104(H) 129(H)  BUN 6 - 23 mg/dL 32(H) 11 11  Creatinine 0.40 - 1.20 mg/dL 1.26(H) 1.31(H) 1.36(H)  Sodium 135 - 145 mEq/L 136 137 135  Potassium 3.5 - 5.1 mEq/L 4.5 3.1(L) 3.2(L)  Chloride 96 - 112 mEq/L 102 108 105  CO2 19 - 32 mEq/L 26 21(L) 22  Calcium 8.4 - 10.5 mg/dL 9.8 10.2 10.4(H)  Total Protein 6.0 - 8.3 g/dL 7.4 - -  Total Bilirubin 0.2 - 1.2 mg/dL 0.3 - -  Alkaline Phos 39 - 117 U/L 50 - -  AST 0 - 37 U/L 16 - -  ALT 0 - 35 U/L 9 - -  Assessment & Plan:   Discussion: 69 year old female with Parkinson's disease and extrapulmonary sarcoidosis with hypercalcemia confirmed via bone biopsy on 02/26/19 (normocellular marrow with noncaseating granulomatous inflammation). Concern for possible ocular involvement. Have discussed plan with Pharmacy team to initiate methotrexate.  Extrapulmonary sarcoidosis  --Last PFT in 2015 demonstrated restrictive defect thought to be related to Parkinson's but represent some contribution of sarcoid. Scheduled for PFTs 05/2019 --EKG and echo reviewed from 03/2019. PR interval is prolonged and grade I DD present. No other abnormalities --Labs ordered: HIV, Hepatitis B and C, CMP --Arrange for Pulmonary Pharmacy visit to initiate Methotrexate 10 mg weekly and folic acid --We will request records from [facility] --Please  send MRI eye results when available  Hypercalcemia secondary to sarcoid --Will need CBC and CMP q 3 months to monitor cell counts and LFTs. Due at next visit at end of May --Will obtain CMP today to determine prednisone 10mg  vs 5 mg ADDENDUM: Started on prednisone 20 mg for increased calcium level  AKI  --Follow-up creatinine and GFR today ADDENDUM: Will refer to Nephrology. Discussed care with Nephrologist who saw patient as an inpatient and will send to [place] of breath --PFTs as above --Continue prednisone with plan to bridge to Methotrexate  Parkinson's disease --KEEP your follow-up with Neurologist   Health Maintenance Immunization History  Administered Date(s) Administered  . Influenza, High Dose Seasonal PF 11/30/2015, 11/16/2016, 10/29/2017, 10/30/2018  . Influenza, Seasonal, Injecte, Preservative Fre 10/20/2012  . Influenza,inj,Quad PF,6+ Mos 11/18/2013  . Influenza-Unspecified 11/18/2013, 11/30/2014, 10/29/2017, 10/29/2018  . Moderna SARS-COVID-2 Vaccination 03/12/2019, 04/09/2019  . Pneumococcal Conjugate-13 01/01/2013  . Pneumococcal Polysaccharide-23 01/05/2014  . Tdap 11/06/2010  . Zoster 11/06/2010  . Zoster Recombinat (Shingrix) 01/10/2018, 03/31/2018   CT Lung Screen - not qualified  Orders Placed This Encounter  Procedures  . Acute Hep Panel & Hep B Surface Ab  . COMPLETE METABOLIC PANEL WITH GFR    Standing Status:   Future    Number of Occurrences:   1    Standing Expiration Date:   04/26/2020  . Hepatitis C antibody, reflex  . HIV antibody (with reflex)    Standing Status:   Future    Number of Occurrences:   1    Standing Expiration Date:   04/26/2020  No orders of the defined types were placed in this encounter.   No follow-ups on file. After PFTs with NP  I have spent a total time of 45-minutes on the day of the appointment reviewing prior documentation, coordinating care and discussing medical diagnosis and plan with the  patient/family. Imaging, labs and tests included in this note have been reviewed and interpreted independently by me.  [place], MD [place] [facility] 04/27/2019 2:13 PM  Office Number [phone]

## 2019-04-27 NOTE — Patient Instructions (Addendum)
Extrapulmonary sarcoidosis with hypercalcemia and possible ocular involvement --Labs ordered: HIV, Hepatitis B and C, CMP --Depending on calcium level, I will re-order prednisone --Arrange for Pulmonary Pharmacy visit to initiate Methotrexate --We will request records from [facility] --Please send MRI eye results when available  Next visit: Pulmonary function test with appointment with NP [facility] afterwards

## 2019-04-28 ENCOUNTER — Telehealth: Payer: Self-pay | Admitting: Pulmonary Disease

## 2019-04-28 DIAGNOSIS — N1832 Chronic kidney disease, stage 3b: Secondary | ICD-10-CM

## 2019-04-28 MED ORDER — PREDNISONE 20 MG PO TABS: 20.0000 mg | ORAL_TABLET | Freq: Every day | ORAL | 0 refills | Status: DC

## 2019-04-28 NOTE — Telephone Encounter (Signed)
Pulmonary Telephone Encounter  I reviewed labwork with patient. Calcium level unfortunately has been increasing on her reduced dose of prednisone. Will titrate back up to prednisone 20 mg with plan to recheck CMP at next visit with NP Mack.  She is also planned to see Pharmacy Team to initiate Methotrexate as soon as possible. We can start to titrate prednisone once calcium levels demonstrate improvement.  Regarding her creatinine and GFR, this is likely related to her sarcoid as well however without biopsy this cannot be confirmed. We will continue to monitor labs but I will also place a Nephrology referral if additional work-up including biopsy is needed to rule out other causes for chronic kidney disease.  Will CC: Amber Yopp RPH-CPP, Wyn Quaker NP, Edrick Oh, MD  Rodman Pickle, M.D. [facility] Pulmonary/Critical Care Medicine 04/28/2019 2:59 PM

## 2019-04-28 NOTE — Telephone Encounter (Signed)
Noted. Seeing me 06/22/19. If I need to move that up let me know. Thank you.   Aaron Edelman

## 2019-04-29 ENCOUNTER — Other Ambulatory Visit: Payer: Self-pay

## 2019-04-29 DIAGNOSIS — Z79631 Long term (current) use of antimetabolite agent: Secondary | ICD-10-CM

## 2019-04-29 DIAGNOSIS — Z79899 Other long term (current) drug therapy: Secondary | ICD-10-CM

## 2019-04-29 NOTE — Telephone Encounter (Signed)
ATC pt, received a busy signal. Will try back. 

## 2019-04-29 NOTE — Telephone Encounter (Signed)
She is scheduled to see NP Mack at the end of the month. I have already notified him of this visit. She will obtain CMP at that time.

## 2019-04-29 NOTE — Telephone Encounter (Signed)
04/29/2019  Triage,   Please schedule the patient at the end of April/2021 with me.   Keep PFT and may/2021 appt as planned.   Routing to [place] as [place] FNP

## 2019-04-29 NOTE — Telephone Encounter (Signed)
Dr. Ellison - please advise. Thanks. 

## 2019-04-30 LAB — COMPLETE METABOLIC PANEL WITH GFR
AG Ratio: 1.6 (calc) (ref 1.0–2.5)
ALT: 5 U/L — ABNORMAL LOW (ref 6–29)
AST: 16 U/L (ref 10–35)
Albumin: 4.5 g/dL (ref 3.6–5.1)
Alkaline phosphatase (APISO): 49 U/L (ref 37–153)
BUN/Creatinine Ratio: 26 (calc) — ABNORMAL HIGH (ref 6–22)
BUN: 33 mg/dL — ABNORMAL HIGH (ref 7–25)
CO2: 26 mmol/L (ref 20–32)
Calcium: 10.3 mg/dL (ref 8.6–10.4)
Chloride: 104 mmol/L (ref 98–110)
Creat: 1.27 mg/dL — ABNORMAL HIGH (ref 0.60–0.93)
GFR, Est African American: 50 mL/min/{1.73_m2} — ABNORMAL LOW (ref >=60)
GFR, Est Non African American: 43 mL/min/{1.73_m2} — ABNORMAL LOW (ref >=60)
Globulin: 2.9 g/dL (calc) (ref 1.9–3.7)
Glucose, Bld: 131 mg/dL — ABNORMAL HIGH (ref 65–99)
Potassium: 5.1 mmol/L (ref 3.5–5.3)
Sodium: 139 mmol/L (ref 135–146)
Total Bilirubin: 0.4 mg/dL (ref 0.2–1.2)
Total Protein: 7.4 g/dL (ref 6.1–8.1)

## 2019-04-30 LAB — HEPATITIS PANEL, ACUTE
Hep A IgM: NONREACTIVE
Hep B C IgM: NONREACTIVE
Hepatitis B Surface Ag: NONREACTIVE
Hepatitis C Ab: NONREACTIVE
SIGNAL TO CUT-OFF: 0.01 (ref <1.00)

## 2019-04-30 LAB — HIV ANTIBODY (ROUTINE TESTING W REFLEX): HIV 1&2 Ab, 4th Generation: NONREACTIVE

## 2019-04-30 LAB — TEST AUTHORIZATION

## 2019-05-03 ENCOUNTER — Telehealth: Payer: Self-pay | Admitting: Pulmonary Disease

## 2019-05-03 NOTE — Telephone Encounter (Signed)
Spoke with pt. She is aware of Dr. Cordelia Pen response. Nothing further needed.

## 2019-05-03 NOTE — Progress Notes (Signed)
HPI  Patient presents today to [facility] Pulmonary for Initial appt with pharmacy team for methotrexate counseling. Pertinent past medical history includes sarcoidosis, parkinson's disease, HTN, HLD . She is naive to immunosuppressant therapy.  OBJECTIVE Allergies  Allergen Reactions  . Myrbetriq [Mirabegron] Other (See Comments)    SEVERE muscle and joint pain  . Lipitor [Atorvastatin] Other (See Comments)    Muscle pain  . Zocor [Simvastatin] Other (See Comments)    Muscle pain    Outpatient Encounter Medications as of 05/04/2019  Medication Sig Note  . acetaminophen (TYLENOL) 500 MG tablet Take 1,000 mg by mouth every 6 (six) hours as needed for mild pain.   . Amantadine HCl 100 MG tablet Take 1 tablet (100 mg total) by mouth daily.   Marland Kitchen amLODipine (NORVASC) 10 MG tablet Take 1 tablet (10 mg total) by mouth daily.   Marland Kitchen aspirin 81 MG tablet Take 81 mg by mouth daily.   . carbidopa-levodopa (SINEMET IR) 25-100 MG tablet TAKE ONE TABLET BY MOUTH THREE TIMES DAILY (Patient taking differently: Take 1 tablet by mouth See admin instructions. Take 1 tablet by mouth three times a day- 8 AM, 12 PM, and between 5-6 PM)   . Carbidopa-Levodopa ER (SINEMET CR) 25-100 MG tablet controlled release Take 1 tablet by mouth 2 (two) times daily.   . cyanocobalamin 100 MCG tablet Take 1 tablet (100 mcg total) by mouth daily.   . hydrocortisone (ANUSOL-HC) 25 MG suppository Place 1 suppository (25 mg total) rectally at bedtime as needed for hemorrhoids or itching.   . potassium chloride SA (KLOR-CON) 20 MEQ tablet Take 1 tablet (20 mEq total) by mouth daily.   . predniSONE (DELTASONE) 20 MG tablet Take 1 tablet (20 mg total) by mouth daily with breakfast.   . rOPINIRole (REQUIP) 1 MG tablet Take 1 tablet (1 mg total) by mouth 3 (three) times daily. (Patient taking differently: Take 1 mg by mouth See admin instructions. Take 1 mg by mouth three times a day- 8 AM, 12 PM, and between 5-6 PM) 02/22/2019: Morning,  noontime, and 4-5 PM  . rosuvastatin (CRESTOR) 5 MG tablet Take 5 mg by mouth every evening.    . senna (SENOKOT) 8.6 MG TABS tablet Take 1 tablet (8.6 mg total) by mouth 2 (two) times daily.   . sertraline (ZOLOFT) 100 MG tablet Take 100 mg by mouth daily.    No facility-administered encounter medications on file as of 05/04/2019.     Immunization History  Administered Date(s) Administered  . Influenza, High Dose Seasonal PF 11/30/2015, 11/16/2016, 10/29/2017, 10/30/2018  . Influenza, Seasonal, Injecte, Preservative Fre 10/20/2012  . Influenza,inj,Quad PF,6+ Mos 11/18/2013  . Influenza-Unspecified 11/18/2013, 11/30/2014, 10/29/2017, 10/29/2018  . Moderna SARS-COVID-2 Vaccination 03/12/2019, 04/09/2019  . Pneumococcal Conjugate-13 01/01/2013  . Pneumococcal Polysaccharide-23 01/05/2014  . Tdap 11/06/2010  . Zoster 11/06/2010  . Zoster Recombinat (Shingrix) 01/10/2018, 03/31/2018     PFT's TLC  Date Value Ref Range Status  11/18/2013 3.09 L Final     CMP     Component Value Date/Time   NA 139 04/27/2019 1604   K 5.1 04/27/2019 1604   CL 104 04/27/2019 1604   CO2 26 04/27/2019 1604   GLUCOSE 131 (H) 04/27/2019 1604   BUN 33 (H) 04/27/2019 1604   CREATININE 1.27 (H) 04/27/2019 1604   CALCIUM 10.3 04/27/2019 1604   CALCIUM 9.7 02/05/2019 0739   PROT 7.4 04/27/2019 1604   ALBUMIN 4.3 04/05/2019 1605   AST 16 04/27/2019 1604  ALT 5 (L) 04/27/2019 1604   ALKPHOS 50 04/05/2019 1605   BILITOT 0.4 04/27/2019 1604   GFRNONAA 43 (L) 04/27/2019 1604   GFRAA 50 (L) 04/27/2019 1604     CBC    Component Value Date/Time   WBC 4.8 02/26/2019 0219   RBC 3.87 02/26/2019 0219   HGB 11.2 (L) 02/26/2019 0219   HCT 33.5 (L) 02/26/2019 0219   PLT 168 02/26/2019 0219   MCV 86.6 02/26/2019 0219   MCH 28.9 02/26/2019 0219   MCHC 33.4 02/26/2019 0219   RDW 13.3 02/26/2019 0219   LYMPHSABS 0.5 (L) 02/26/2019 0219   MONOABS 0.4 02/26/2019 0219   EOSABS 0.5 02/26/2019 0219   BASOSABS  0.1 02/26/2019 0219     LFT's Hepatic Function Latest Ref Rng & Units 04/27/2019 04/05/2019 02/24/2019  Total Protein 6.1 - 8.1 g/dL 7.4 7.4 5.9(L)  Albumin 3.5 - 5.2 g/dL - 4.3 2.7(L)  AST 10 - 35 U/L _0 ALT 6 - 29 U/L 5(L) 9 8  Alk Phosphatase 39 - 117 U/L - 50 65  Total Bilirubin 0.2 - 1.2 mg/dL 0.4 0.3 0.2(L)  Bilirubin, Direct 0.0 - 0.2 mg/dL - - <0.1     TB GOLD Quantiferon TB Gold Latest Ref Rng & Units 04/05/2019  Quantiferon TB Gold Plus NEGATIVE NEGATIVE    Hepatitis Panel Hepatitis Latest Ref Rng & Units 04/27/2019  Hep B Surface Ag NON-REACTI NON-REACTIVE  Hep B IgM NON-REACTI NON-REACTIVE  Hep C Ab NON-REACTI NON-REACTIVE  Hep C Ab NON-REACTI NON-REACTIVE  Hep A IgM NON-REACTI NON-REACTIVE    HIV Lab Results  Component Value Date   HIV NON-REACTIVE 04/27/2019   HIV NON REACTIVE 02/04/2019    G6PD No results found for: G6PDH  TPMT No results found for: TPMT   ASSESSMENT  1. Methotrexate Medication Management  Counseled patient of proper use, storage,  and dosing of Methotrexate. Counseled on common side effects including nausea, infection, and signs and symptoms of pneumonitis.  Recommend taking with dinner to decrease risk of stomach upset.  Discussed that there is the possibility of an increased risk of malignancy, specifically lymphomas, but it is not well understood if this increased risk is due to the medication or the disease state.  Instructed patient that medication should be held for infection and prior to surgery.  Advised patient to avoid live vaccines.   Methotrexate dose will be 10 mg every 7 days along with folic acid 1 mg daily.  Prescription sent to [facility] per patient request.  Will check CBC/CMP at next office visit in 3 weeks as patient is recovering from AKI and GFR decreased.   Counseled on the importance of routine lab monitoring every 3 months and 2 weeks after any dose change. Patient verbalized understanding.  She is  concerned about continuing prednisone.  Advised that we will gradually have to decrease her dose based on labs and symptoms.  Patient verbalized understanding.  2. Medication Reconciliation  A drug regimen assessment was performed, including review of allergies, interactions, disease-state management, dosing and immunization history. Medications were reviewed with the patient, including name, instructions, indication, goals of therapy, potential side effects, importance of adherence, and safe use.  Drug interaction(s):   Amantadine and Klor-Con: arrest or delay of potassium chloride solid dose form passage through the gastrointestinal tract.  Patient advised and is not having any issues with GI irritation.  3. Immunizations Patient is up-to-date with annual influenza Prevnar 13, Shingrix and COVID-19 vaccines.  Patient received 1 dose of Pneumovax 23 in December 2015.  Patient is eligible for second dose of Pneumovax 23.   PLAN  START methotrexate 10 mg ONCE A WEEK and Folic acid DAILY.  Take methotrexate with dinner to minimize upset.  KEEP appointment with Wyn Quaker on 4/28 for follow-up and labs (CBC/CMET). Will adjust methotrexate and prednisone dose if needed pending lab results  Recommend Pneumovax 23 vaccine.  All questions encouraged and answered.  Instructed patient to call with any further questions or concerns.  Thank you for allowing pharmacy to participate in this patient's care.  This appointment required  60 minutes of patient care (this includes precharting, chart review, review of results, face-to-face care, etc.).    Mariella Saa, PharmD, [place], [place] [facility] Pharmacist [phone]  05/04/2019 4:17 PM

## 2019-05-03 NOTE — Telephone Encounter (Signed)
Now seeing me on 05/26/19. Just updating you.  Wyn Quaker FNP

## 2019-05-03 NOTE — Telephone Encounter (Signed)
Spoke with pt. She is questioning if she needs to do any blood work when she comes in for her appointment tomorrow with the pharmacy team. There are some orders placed in the pt's chart for additional Hepatitis panels.  Dr. Loanne Drilling - does the pt need to do these?

## 2019-05-03 NOTE — Telephone Encounter (Signed)
It appears that the labs we needed have just resulted. So she does not need a lab draw tomorrow. -JE

## 2019-05-04 ENCOUNTER — Other Ambulatory Visit: Payer: Self-pay

## 2019-05-04 ENCOUNTER — Ambulatory Visit (INDEPENDENT_AMBULATORY_CARE_PROVIDER_SITE_OTHER): Payer: Medicare Other | Admitting: Pharmacist

## 2019-05-04 DIAGNOSIS — Z7189 Other specified counseling: Secondary | ICD-10-CM

## 2019-05-04 DIAGNOSIS — D869 Sarcoidosis, unspecified: Secondary | ICD-10-CM

## 2019-05-04 MED ORDER — METHOTREXATE 2.5 MG PO TABS: 10.0000 mg | ORAL_TABLET | ORAL | 0 refills | Status: DC

## 2019-05-04 MED ORDER — FOLIC ACID 1 MG PO TABS: 1.0000 mg | ORAL_TABLET | Freq: Every day | ORAL | 0 refills | Status: DC

## 2019-05-04 NOTE — Patient Instructions (Signed)
   START methotrexate 10 mg ONCE A WEEK and Folic acid DAILY  Take methotrexate with dinner to minimize upset.  KEEP appointment with Wyn Quaker on 4/28 for follow-up and labs (CBC/CMET)

## 2019-05-06 NOTE — Telephone Encounter (Signed)
Dr. Loanne Drilling can you see what this patient is referring to in her email sent today   "Dr. Loanne Drilling,  [facility] just released my MRV and MRI results to [facility] Chart. They are supposed to send you a copy of everything. They also included the images of each scan. Please let me know if you can't open these under Test Results and I will try to get these to you in some other way if [facility] does not send them to you first.  Although Dr. Sabra Heck found issues with each eye and had Dr. Karren Cobble confirm during my examination, the MRV and MRI did not find anything remarkable. Although I guess I should be happy they didn't find anything, I find myself almost wishing they had. Would Occular Sarcoidosis necessarily show up on an MRV or MRI? I will begin taking the Methotrexate later this week. Did you say that it might take up to three months to notice any changes in my eyes? Or, could the edema and inflammation be related to something else? I am still scheduled to meet with Dr. Estill Bakes at [facility] on April 26th.   Regards, [name]"  [place] please advise

## 2019-05-19 ENCOUNTER — Other Ambulatory Visit: Payer: Self-pay | Admitting: Pulmonary Disease

## 2019-05-24 ENCOUNTER — Other Ambulatory Visit: Payer: Self-pay | Admitting: Pulmonary Disease

## 2019-05-24 DIAGNOSIS — D869 Sarcoidosis, unspecified: Secondary | ICD-10-CM

## 2019-05-25 NOTE — Progress Notes (Signed)
@Patient  ID: Destiny Roth, female    DOB: Jun 05, 1950, 69 y.o.   MRN: [id number]  Chief Complaint  Patient presents with  . Follow-up    pt states she can't tell a difference. pt needs labs having them dome today. pt needs refills for predinose and mtx    Referring provider: Leeroy Cha  HPI:  69 year old female former smoker followed in our office for sarcoidosis  PMH: Hyperlipidemia, anxiety, depression, IBS, Parkinson's, vertigo, hypercalcemia, sarcoidosis Smoker/ Smoking History: Former smoker Maintenance: Methotrexate, prednisone, folic acid  Pt of: Dr. Loanne Drilling  05/26/2019  - Visit   69 year old female former smoker followed in our office for sarcoidosis.  Patient was last seen by our office on 04/27/2019 initially seen by our practice in January/2021 for evaluation of hypercalcemia as well as consideration of bronchoscopy to rule out sarcoid.  Bone biopsy was performed and demonstrated granulomatous disease without necrosis.  She was started on steroids and discharged with endocrine and pulmonary follow-up.  Had previously been seen by our office in 2018 for obstructive sleep apnea.  Prior PFTs demonstrate a mild restrictive defect with normal DLCO which per patient was originally felt to be reflective of her Parkinson's disease.  She is also been followed by the [facility] for visual changes.  Per chart review patient has been evaluated by our pharmacy team as well as patient has notified us that she is completed her follow-up with Dr. Estill Bakes on 05/24/2019 note reviewed from 05/24/2019 listed below:  Timeline: - 2013 Dx - Parkinson disease - now has problem with breathing  - 03/02/2016 - onset of vertigo BPPV and improved over time. However, developed tinnitus and all started in Feb. 2018 and getting worse.  -01/2019 - Dx sarcoidosis  - on Methotrexate and Prednisone   # Optic disc edema, OD # Optic Neuropathy, OS - diagnosed 01/2019 with sarcoidosis seen  on bone biopsy after she was found to have hypercalcemia - Today significant improvement in swelling - today OCT RNFL OD is normal  - pt reports floaters and intermittent vision loss when in the dark  - Hertel B: 92/14/14 - BCVA 20/25-2 OD and 20/40 OS, full color vision - Exam shows old vitreous cell and optic disc edema OD and optic nerve pallor OS  - HVF with VF defects OS>OD and RNFL shows increased thickness OD and thinning OS  - DDX includes inflammatory reaction from Sarcoidosis (currently being treated with steroids), infiltration of the optic nerve by sarcoidosis, intracranial compressive lesion, Optic neuritis (although patient denies eye pain) pseudopapilledema (optic disc drusen or congenitally anomalous disc), ischemic optic neuropathy (arteritic or non-arteritic), infectious or inflammatory optic neuropathy (diabetic papillopathy, syphilis, thyroid orbitopathy, vasculitis), toxic optic neuropathy (methanol, amiodarone, tetracycline, Vit A), or Leber's hereditary optic neuropathy Plan:  - Fundus photos obtained  - MRI/MRV Brain and orbits with CN2 protocol ordered - Pt has appointment with Dr. Estill Bakes on 4/26 we can move this appointment up depending on MRI findings   # Pseudophakia, OU - s/p Yag OU 04/2016  Plan:  - monitor  - Follow up in 3 months  -- I have spent more than 50% of the provider's face-to-face visit time with a patient is spent in counseling or coordination of care which is more then 45 minutes for a long discussion and pre post visit evaluation.  - I was present and involved in all parts of the service and the documentation is mine.     Per patient initial review by ophthalmology  is showing that some inflammation is subsided.  Patient presenting to office today reporting that she continues to have shortness of breath, brain fog, occasional dizziness, poor vision.  Patient has not noticed any symptomatic improvement since starting methotrexate and prednisone.  Walk  today in office reveals no significant oxygen desaturations.   Questionaires / Pulmonary Flowsheets:   MMRC: mMRC Dyspnea Scale mMRC Score  05/26/2019 2    Tests:   Imaging: CXR 02/23/19 - interval resolutions of bilateral pleural effusions  CT Chest 02/09/19 - Bilateral pleural effusions. Mild ground glass opacifications bilaterally. No enlarged mediastinal or hilar adenopathy.  PFT: 12/17/13 FVC 2.26 (86%) FEV1 1.88 (93%) Ratio 78  TLC 70% DLCO 75% Interpretation: Mild restrictive defect with mild reduction in gas exchange (uncorrected DLCO)  Echocardiogram: 04/22/19 - EF 60-65%. Grade I DD. No WMA or valvular abnormalities.  FENO:  No results found for: NITRICOXIDE  PFT: PFT Results Latest Ref Rng & Units 11/18/2013  FVC-Pre L 2.37  FVC-Predicted Pre % 90  FVC-Post L 2.26  FVC-Predicted Post % 86  Pre FEV1/FVC % % 78  Post FEV1/FCV % % 83  FEV1-Pre L 1.84  FEV1-Predicted Pre % 92  FEV1-Post L 1.88  DLCO UNC% % 75  DLCO COR %Predicted % 88  TLC L 3.09  TLC % Predicted % 70  RV % Predicted % -2    WALK:  SIX MIN WALK 05/26/2019 01/12/2014  Supplimental Oxygen during Test? (L/min) - No  Tech Comments: pt walked at even pace//sb Moderate pace -complained of fatigue    Imaging: No results found.  Lab Results:  CBC    Component Value Date/Time   WBC 4.8 02/26/2019 0219   RBC 3.87 02/26/2019 0219   HGB 11.2 (L) 02/26/2019 0219   HCT 33.5 (L) 02/26/2019 0219   PLT 168 02/26/2019 0219   MCV 86.6 02/26/2019 0219   MCH 28.9 02/26/2019 0219   MCHC 33.4 02/26/2019 0219   RDW 13.3 02/26/2019 0219   LYMPHSABS 0.5 (L) 02/26/2019 0219   MONOABS 0.4 02/26/2019 0219   EOSABS 0.5 02/26/2019 0219   BASOSABS 0.1 02/26/2019 0219    BMET    Component Value Date/Time   NA 139 04/27/2019 1604   K 5.1 04/27/2019 1604   CL 104 04/27/2019 1604   CO2 26 04/27/2019 1604   GLUCOSE 131 (H) 04/27/2019 1604   BUN 33 (H) 04/27/2019 1604   CREATININE 1.27 (H) 04/27/2019  1604   CALCIUM 10.3 04/27/2019 1604   CALCIUM 9.7 02/05/2019 0739   GFRNONAA 43 (L) 04/27/2019 1604   GFRAA 50 (L) 04/27/2019 1604    BNP No results found for: BNP  ProBNP    Component Value Date/Time   PROBNP 9.0 10/08/2013 1325    Specialty Problems      Pulmonary Problems   OSA (obstructive sleep apnea)    Mild-mod OSA-AHI 12/h, RDI 23/h      DOE (dyspnea on exertion)   Restrictive lung disease    Mild by PFT nov 2015, due to parenchymal disease      Chest congestion      Allergies  Allergen Reactions  . Myrbetriq [Mirabegron] Other (See Comments)    SEVERE muscle and joint pain  . Lipitor [Atorvastatin] Other (See Comments)    Muscle pain  . Zocor [Simvastatin] Other (See Comments)    Muscle pain    Immunization History  Administered Date(s) Administered  . Influenza, High Dose Seasonal PF 11/30/2015, 11/16/2016, 10/29/2017, 10/30/2018  . Influenza, Seasonal,  Injecte, Preservative Fre 10/20/2012  . Influenza,inj,Quad PF,6+ Mos 11/18/2013  . Influenza-Unspecified 11/18/2013, 11/30/2014, 10/29/2017, 10/29/2018  . Moderna SARS-COVID-2 Vaccination 03/12/2019, 04/09/2019  . Pneumococcal Conjugate-13 01/01/2013  . Pneumococcal Polysaccharide-23 01/05/2014  . Tdap 11/06/2010  . Zoster 11/06/2010  . Zoster Recombinat (Shingrix) 01/10/2018, 03/31/2018    Past Medical History:  Diagnosis Date  . Abdominal pain, epigastric 09/18/2009  . Anosmia 11/06/2010  . ANXIETY 09/23/2006  . BUNION, RIGHT FOOT 09/18/2009  . CARPAL TUNNEL SYNDROME, BILATERAL 09/23/2006  . DEPRESSION 09/23/2006  . DIVERTICULOSIS, COLON 09/23/2006  . GERD 05/24/2008  . GLUCOSE INTOLERANCE 05/24/2008  . Hemorrhoids   . Hypercalcemia   . HYPERLIPIDEMIA 09/23/2006  . HYPERTENSION 09/23/2006  . IBS 09/23/2006  . Impaired glucose tolerance 11/04/2010  . OSTEOPOROSIS 05/24/2008  . Parkinson's disease ([place])   . Restrictive lung disease 12/20/2013    Tobacco History: Social History   Tobacco Use    Smoking Status Passive Smoke Exposure - Never Smoker  Smokeless Tobacco Never Used   Counseling given: Yes  Continue to not smoke  Outpatient Encounter Medications as of 05/26/2019  Medication Sig  . acetaminophen (TYLENOL) 500 MG tablet Take 1,000 mg by mouth every 6 (six) hours as needed for mild pain.  . Amantadine HCl 100 MG tablet Take 1 tablet (100 mg total) by mouth daily.  Marland Kitchen amLODipine (NORVASC) 10 MG tablet Take 1 tablet (10 mg total) by mouth daily.  Marland Kitchen aspirin 81 MG tablet Take 81 mg by mouth daily.  . carbidopa-levodopa (SINEMET IR) 25-100 MG tablet TAKE ONE TABLET BY MOUTH THREE TIMES DAILY (Patient taking differently: Take 1 tablet by mouth See admin instructions. Take 1 tablet by mouth three times a day- 8 AM, 12 PM, and between 5-6 PM)  . Carbidopa-Levodopa ER (SINEMET CR) 25-100 MG tablet controlled release Take 1 tablet by mouth 2 (two) times daily.  . cyanocobalamin 100 MCG tablet Take 1 tablet (100 mcg total) by mouth daily.  . diphenhydrAMINE (BENADRYL) 25 MG tablet Take 25 mg by mouth at bedtime as needed for sleep.  . folic acid (FOLVITE) 1 MG tablet Take 1 tablet (1 mg total) by mouth daily.  . hydrocortisone (ANUSOL-HC) 25 MG suppository Place 1 suppository (25 mg total) rectally at bedtime as needed for hemorrhoids or itching.  . methotrexate (RHEUMATREX) 2.5 MG tablet Take 4 tablets (10 mg total) by mouth once a week for 28 days. Caution:Chemotherapy. Protect from light.  . potassium chloride SA (KLOR-CON) 20 MEQ tablet Take 1 tablet (20 mEq total) by mouth daily.  . predniSONE (DELTASONE) 20 MG tablet TAKE 1 TABLET BY MOUTH DAILY WITH BREAKFAST  . rOPINIRole (REQUIP) 1 MG tablet Take 1 tablet (1 mg total) by mouth 3 (three) times daily. (Patient taking differently: Take 1 mg by mouth See admin instructions. Take 1 mg by mouth three times a day- 8 AM, 12 PM, and between 5-6 PM)  . rosuvastatin (CRESTOR) 5 MG tablet Take 5 mg by mouth every evening.   . senna  (SENOKOT) 8.6 MG TABS tablet Take 1 tablet (8.6 mg total) by mouth 2 (two) times daily. (Patient taking differently: Take 1 tablet by mouth daily as needed. )  . sertraline (ZOLOFT) 100 MG tablet Take 100 mg by mouth daily.   No facility-administered encounter medications on file as of 05/26/2019.     Review of Systems  Review of Systems  Constitutional: Positive for fatigue. Negative for activity change and fever.  HENT: Negative for sinus pressure, sinus pain  and sore throat.   Eyes: Positive for visual disturbance.  Respiratory: Positive for shortness of breath. Negative for cough and wheezing.   Cardiovascular: Negative for chest pain and palpitations.  Gastrointestinal: Negative for diarrhea, nausea and vomiting.  Musculoskeletal: Positive for myalgias. Negative for arthralgias.  Neurological: Positive for dizziness and tremors (worsened PD ).  Psychiatric/Behavioral: Negative for sleep disturbance. The patient is not nervous/anxious.      Physical Exam  BP 110/82 (BP Location: Left Arm, Cuff Size: Normal)   Pulse 93   Ht 4\' 9"  (1.448 m)   Wt 130 lb 9.6 oz (59.2 kg)   SpO2 96%   BMI 28.26 kg/m   Wt Readings from Last 5 Encounters:  05/26/19 130 lb 9.6 oz (59.2 kg)  04/27/19 126 lb 9.6 oz (57.4 kg)  04/05/19 124 lb 6.4 oz (56.4 kg)  02/04/19 130 lb 1.1 oz (59 kg)  05/14/16 140 lb (63.5 kg)    BMI Readings from Last 5 Encounters:  05/26/19 28.26 kg/m  04/27/19 26.46 kg/m  04/05/19 26.00 kg/m  02/04/19 28.45 kg/m  05/14/16 30.30 kg/m     Physical Exam Vitals and nursing note reviewed.  Constitutional:      General: She is not in acute distress.    Appearance: Normal appearance. She is normal weight.  HENT:     Head: Normocephalic and atraumatic.     Right Ear: Tympanic membrane, ear canal and external ear normal. There is no impacted cerumen.     Left Ear: Tympanic membrane, ear canal and external ear normal. There is no impacted cerumen.     Nose: Nose  normal. No congestion.     Mouth/Throat:     Mouth: Mucous membranes are moist.     Pharynx: Oropharynx is clear.  Eyes:     General:        Right eye: No discharge.        Left eye: No discharge.     Extraocular Movements: Extraocular movements intact.     Pupils: Pupils are equal, round, and reactive to light.  Cardiovascular:     Rate and Rhythm: Normal rate and regular rhythm.     Pulses: Normal pulses.     Heart sounds: Normal heart sounds. No murmur.  Pulmonary:     Breath sounds: No decreased air movement. No decreased breath sounds, wheezing or rales.  Musculoskeletal:     Cervical back: Normal range of motion.  Skin:    General: Skin is warm and dry.     Capillary Refill: Capillary refill takes less than 2 seconds.  Neurological:     General: No focal deficit present.     Mental Status: She is alert and oriented to person, place, and time. Mental status is at baseline.     Gait: Gait normal.  Psychiatric:        Mood and Affect: Mood normal.        Behavior: Behavior normal.        Thought Content: Thought content normal.        Judgment: Judgment normal.       Assessment & Plan:   OSA (obstructive sleep apnea) Plan: Continue CPAP  Therapeutic drug monitoring Plan: Lab work today  Sarcoidosis Plan: Continue follow-up with ophthalmology I will discuss case with Dr. Loanne Drilling to consider referral to an academic center Walk today in office, no oxygen desaturations Lab work today Continue methotrexate, will increase based off of lab work Continue prednisone 20 mg, will decrease based  off of lab work We will get lab work to evaluate for G6PD if patient has to remain on elevated methotrexate as well as elevated prednisone may need to consider Monday Wednesday Friday Bactrim Keep follow-up next month with pulmonary function testing  Visual disturbance Per patient improved ocular inflammation  Plan: Keep follow-up with [facility] ophthalmology  Hypercalcemia due  to sarcoidosis Plan: Lab work today    Return in about 6 weeks (around 07/07/2019), or if symptoms worsen or fail to improve, for Follow up with Dr. Loanne Drilling.   [name], NP 05/26/2019   This appointment required 42 minutes of patient care (this includes precharting, chart review, review of results, face-to-face care, etc.).

## 2019-05-26 ENCOUNTER — Encounter: Payer: Self-pay | Admitting: Pulmonary Disease

## 2019-05-26 ENCOUNTER — Other Ambulatory Visit: Payer: Self-pay

## 2019-05-26 ENCOUNTER — Ambulatory Visit (INDEPENDENT_AMBULATORY_CARE_PROVIDER_SITE_OTHER): Payer: Medicare Other | Admitting: Pulmonary Disease

## 2019-05-26 ENCOUNTER — Other Ambulatory Visit (INDEPENDENT_AMBULATORY_CARE_PROVIDER_SITE_OTHER): Payer: Medicare Other

## 2019-05-26 VITALS — BP 110/82 | HR 93 | Ht <= 58 in | Wt 130.6 lb

## 2019-05-26 DIAGNOSIS — D869 Sarcoidosis, unspecified: Secondary | ICD-10-CM

## 2019-05-26 DIAGNOSIS — H539 Unspecified visual disturbance: Secondary | ICD-10-CM

## 2019-05-26 DIAGNOSIS — Z5181 Encounter for therapeutic drug level monitoring: Secondary | ICD-10-CM

## 2019-05-26 DIAGNOSIS — G4733 Obstructive sleep apnea (adult) (pediatric): Secondary | ICD-10-CM

## 2019-05-26 LAB — COMPREHENSIVE METABOLIC PANEL
ALT: 6 U/L (ref 0–35)
AST: 12 U/L (ref 0–37)
Albumin: 4.3 g/dL (ref 3.5–5.2)
Alkaline Phosphatase: 46 U/L (ref 39–117)
BUN: 32 mg/dL — ABNORMAL HIGH (ref 6–23)
CO2: 26 mEq/L (ref 19–32)
Calcium: 9.7 mg/dL (ref 8.4–10.5)
Chloride: 104 mEq/L (ref 96–112)
Creatinine, Ser: 1.13 mg/dL (ref 0.40–1.20)
GFR: 47.51 mL/min — ABNORMAL LOW (ref >60.00)
Glucose, Bld: 138 mg/dL — ABNORMAL HIGH (ref 70–99)
Potassium: 4.1 mEq/L (ref 3.5–5.1)
Sodium: 139 mEq/L (ref 135–145)
Total Bilirubin: 0.4 mg/dL (ref 0.2–1.2)
Total Protein: 7.1 g/dL (ref 6.0–8.3)

## 2019-05-26 LAB — HEPATIC FUNCTION PANEL
ALT: 6 U/L (ref 0–35)
AST: 12 U/L (ref 0–37)
Albumin: 4.3 g/dL (ref 3.5–5.2)
Alkaline Phosphatase: 46 U/L (ref 39–117)
Bilirubin, Direct: 0.1 mg/dL (ref 0.0–0.3)
Total Bilirubin: 0.4 mg/dL (ref 0.2–1.2)
Total Protein: 7.1 g/dL (ref 6.0–8.3)

## 2019-05-26 NOTE — Assessment & Plan Note (Signed)
Plan: Continue follow-up with ophthalmology I will discuss case with Dr. Loanne Drilling to consider referral to an academic center Walk today in office, no oxygen desaturations Lab work today Continue methotrexate, will increase based off of lab work Continue prednisone 20 mg, will decrease based off of lab work We will get lab work to evaluate for G6PD if patient has to remain on elevated methotrexate as well as elevated prednisone may need to consider Monday Wednesday Friday Bactrim Keep follow-up next month with pulmonary function testing

## 2019-05-26 NOTE — Patient Instructions (Addendum)
You were seen today by [name], NP  for:   1. Sarcoidosis  Continue methotrexate 10 mg weekly Continue daily folic acid Lab work today Walk today in office Continue prednisone 20 mg daily  Complete pulmonary function testing next month with our office  After lab work will evaluate increasing your methotrexate as well as considering decreasing your prednisone.  I will discuss your case with Dr. Loanne Drilling to see if she agrees with a referral to an academic center such as [facility] or [place].  2. Visual disturbance  Continue follow-up with ophthalmology  3. Hypercalcemia due to sarcoidosis 4. Therapeutic drug monitoring  - Comp Met (CMET); Future - Hepatic function panel; Future - CBC with Differential/Platelet; Future - Glucose 6 phosphate dehydrogenase; Future  [address] [facility]   5. OSA (obstructive sleep apnea)  We recommend that you continue using your CPAP daily >>>Keep up the hard work using your device >>> Goal should be wearing this for the entire night that you are sleeping, at least 4 to 6 hours  Remember:  . Do not drive or operate heavy machinery if tired or drowsy.  . Please notify the supply company and office if you are unable to use your device regularly due to missing supplies or machine being broken.  . Work on maintaining a healthy weight and following your recommended nutrition plan  . Maintain proper daily exercise and movement  . Maintaining proper use of your device can also help improve management of other chronic illnesses such as: Blood pressure, blood sugars, and weight management.   BiPAP/ CPAP Cleaning:  >>>Clean weekly, with Dawn soap, and bottle brush.  Set up to air dry. >>> Wipe mask out daily with wet wipe or towelette    We recommend today:  Orders Placed This Encounter  Procedures  . Comp Met (CMET)    Standing Status:   Future    Standing Expiration Date:   05/25/2020  . Hepatic function panel    Standing Status:    Future    Standing Expiration Date:   05/25/2020  . Glucose 6 phosphate dehydrogenase    Standing Status:   Future    Standing Expiration Date:   05/25/2020  . CBC with Differential/Platelet    Standing Status:   Future    Standing Expiration Date:   05/25/2020   Orders Placed This Encounter  Procedures  . Comp Met (CMET)  . Hepatic function panel  . Glucose 6 phosphate dehydrogenase  . CBC with Differential/Platelet   No orders of the defined types were placed in this encounter.   Follow Up:    Return in about 6 weeks (around 07/07/2019), or if symptoms worsen or fail to improve, for Follow up with Dr. Loanne Drilling.   Please do your part to reduce the spread of COVID-19:      Reduce your risk of any infection  and COVID19 by using the similar precautions used for avoiding the common cold or flu:  Marland Kitchen Wash your hands often with soap and warm water for at least 20 seconds.  If soap and water are not readily available, use an alcohol-based hand sanitizer with at least 60% alcohol.  . If coughing or sneezing, cover your mouth and nose by coughing or sneezing into the elbow areas of your shirt or coat, into a tissue or into your sleeve (not your hands). Langley Gauss A MASK when in public  . Avoid shaking hands with others and consider  head nods or verbal greetings only. . Avoid touching your eyes, nose, or mouth with unwashed hands.  . Avoid close contact with people who are sick. . Avoid places or events with large numbers of people in one location, like concerts or sporting events. . If you have some symptoms but not all symptoms, continue to monitor at home and seek medical attention if your symptoms worsen. . If you are having a medical emergency, call 911.   [place] / e-Visit: [email or web address]         [facility]: [facility]: [phone]                    [facility]: [phone]     It is flu season:   >>> Best ways to protect herself from the flu: Receive the yearly flu vaccine, practice good hand hygiene washing with soap and also using hand sanitizer when available, eat a nutritious meals, get adequate rest, hydrate appropriately   Please contact the office if your symptoms worsen or you have concerns that you are not improving.   Thank you for choosing [facility] for your healthcare, and for allowing [place] to partner with you on your healthcare journey. I am thankful to be able to provide care to you today.   Wyn Quaker FNP-C

## 2019-05-26 NOTE — Assessment & Plan Note (Signed)
Plan: Lab work today 

## 2019-05-26 NOTE — Assessment & Plan Note (Signed)
Plan: Continue CPAP 

## 2019-05-26 NOTE — Telephone Encounter (Signed)
Patient has appt with Aaron Edelman NP 05/26/19 with PFT and follow up scheduled for 5/25  Dr Loanne Drilling, at this time, is not back in the office through May.  June schedule is not available yet.  E-mail sent to patient letting her know that Aaron Edelman NP will forward the info to Dr Loanne Drilling, but she will not be available to review it as of yet.

## 2019-05-26 NOTE — Assessment & Plan Note (Signed)
Per patient improved ocular inflammation  Plan: Keep follow-up with [facility] ophthalmology

## 2019-05-27 ENCOUNTER — Other Ambulatory Visit: Payer: Self-pay | Admitting: Pulmonary Disease

## 2019-05-27 DIAGNOSIS — D869 Sarcoidosis, unspecified: Secondary | ICD-10-CM

## 2019-05-27 LAB — CBC WITH DIFFERENTIAL/PLATELET
Basophils Absolute: 0 10*3/uL (ref 0.0–0.1)
Basophils Relative: 0.1 % (ref 0.0–3.0)
Eosinophils Absolute: 0 10*3/uL (ref 0.0–0.7)
Eosinophils Relative: 0.2 % (ref 0.0–5.0)
HCT: 39.7 % (ref 36.0–46.0)
Hemoglobin: 13.2 g/dL (ref 12.0–15.0)
Lymphocytes Relative: 6 % — ABNORMAL LOW (ref 12.0–46.0)
Lymphs Abs: 0.9 10*3/uL (ref 0.7–4.0)
MCHC: 33.4 g/dL (ref 30.0–36.0)
MCV: 92.2 fl (ref 78.0–100.0)
Monocytes Absolute: 0.3 10*3/uL (ref 0.1–1.0)
Monocytes Relative: 2 % — ABNORMAL LOW (ref 3.0–12.0)
Neutro Abs: 14.3 10*3/uL — ABNORMAL HIGH (ref 1.4–7.7)
Neutrophils Relative %: 91.7 % — ABNORMAL HIGH (ref 43.0–77.0)
Platelets: 330 10*3/uL (ref 150.0–400.0)
RBC: 4.3 Mil/uL (ref 3.87–5.11)
RDW: 15.4 % (ref 11.5–15.5)
WBC: 15.6 10*3/uL — ABNORMAL HIGH (ref 4.0–10.5)

## 2019-05-27 LAB — GLUCOSE 6 PHOSPHATE DEHYDROGENASE: G-6PDH: 19.6 U/g Hgb (ref 7.0–20.5)

## 2019-05-27 NOTE — Telephone Encounter (Signed)
Dr. Loanne Drilling, please advise if you are okay with [place] sending in 90-day supply of med for pt.

## 2019-05-27 NOTE — Telephone Encounter (Signed)
I have placed a documents on your desk.  Please let me know if we will have any changes in the plan of care after speaking with ophthalmology later on this week.  Patient is open to a referral to an academic center for management of both pulmonary as well as ophthalmology.  Patient states she will go wherever we recommend.  Currently her ophthalmologist is within the [facility] system.  But she is open to establishing care [place] FNP

## 2019-05-28 MED ORDER — METHOTREXATE 2.5 MG PO TABS
12.5000 mg | ORAL_TABLET | ORAL | 0 refills | Status: DC
Start: 2019-05-28 — End: 2019-06-21

## 2019-05-28 MED ORDER — PREDNISONE 5 MG PO TABS
15.0000 mg | ORAL_TABLET | Freq: Every day | ORAL | 0 refills | Status: DC
Start: 2019-05-28 — End: 2019-06-21

## 2019-05-28 NOTE — Telephone Encounter (Signed)
Ok for 90 day supply.  

## 2019-06-01 ENCOUNTER — Ambulatory Visit (AMBULATORY_SURGERY_CENTER): Payer: Self-pay

## 2019-06-01 VITALS — Temp 97.0°F | Ht <= 58 in | Wt 130.0 lb

## 2019-06-01 DIAGNOSIS — Z1211 Encounter for screening for malignant neoplasm of colon: Secondary | ICD-10-CM

## 2019-06-01 MED ORDER — NA SULFATE-K SULFATE-MG SULF 17.5-3.13-1.6 GM/177ML PO SOLN: 1.0000 | Freq: Once | ORAL | 0 refills | Status: AC

## 2019-06-01 NOTE — Progress Notes (Signed)
No egg or soy allergy known to patient  No issues with past sedation with any surgeries  or procedures, no intubation problems  No diet pills per patient No home 02 use per patient  No blood thinners per patient  Pt denies issues with constipation  No A fib or A flutter  EMMI video sent to pt's e mail   suprep coupon and code given  Pt completed covid vaccine series   Due to the COVID-19 pandemic we are asking patients to follow these guidelines. Please only bring one care partner. Please be aware that your care partner may wait in the car in the parking lot or if they feel like they will be too hot to wait in the car, they may wait in the lobby on the 4th floor. All care partners are required to wear a mask the entire time (we do not have any that we can provide them), they need to practice social distancing, and we will do a Covid check for all patient's and care partners when you arrive. Also we will check their temperature and your temperature. If the care partner waits in their car they need to stay in the parking lot the entire time and we will call them on their cell phone when the patient is ready for discharge so they can bring the car to the front of the building. Also all patient's will need to wear a mask into building.

## 2019-06-02 ENCOUNTER — Telehealth: Payer: Self-pay | Admitting: Pulmonary Disease

## 2019-06-02 DIAGNOSIS — D869 Sarcoidosis, unspecified: Secondary | ICD-10-CM

## 2019-06-02 DIAGNOSIS — H539 Unspecified visual disturbance: Secondary | ICD-10-CM

## 2019-06-02 NOTE — Telephone Encounter (Signed)
06/02/2019  I spoken with Dr. Loanne Drilling regarding the patient.  She agrees we should proceed forward with referral to [facility] ophthalmology for retinal specialist and evaluation of her sarcoidosis affecting her vision.  I have placed this referral urgently.  Wyn Quaker, FNP

## 2019-06-15 ENCOUNTER — Ambulatory Visit (AMBULATORY_SURGERY_CENTER): Payer: Medicare Other | Admitting: Internal Medicine

## 2019-06-15 ENCOUNTER — Other Ambulatory Visit: Payer: Self-pay

## 2019-06-15 ENCOUNTER — Encounter: Payer: Self-pay | Admitting: Internal Medicine

## 2019-06-15 VITALS — BP 129/74 | HR 78 | Temp 96.3°F | Resp 20 | Ht <= 58 in | Wt 130.0 lb

## 2019-06-15 DIAGNOSIS — D122 Benign neoplasm of ascending colon: Secondary | ICD-10-CM | POA: Diagnosis not present

## 2019-06-15 DIAGNOSIS — Z1211 Encounter for screening for malignant neoplasm of colon: Secondary | ICD-10-CM

## 2019-06-15 MED ORDER — SODIUM CHLORIDE 0.9 % IV SOLN: 500.0000 mL | INTRAVENOUS | Status: DC

## 2019-06-15 MED ORDER — HYDROCORTISONE ACETATE 25 MG RE SUPP: 25.0000 mg | Freq: Two times a day (BID) | RECTAL | 3 refills | Status: DC

## 2019-06-15 NOTE — Op Note (Signed)
[facility] Patient Name: Destiny Roth Procedure Date: 06/15/2019 11:34 AM MRN: [id number] Endoscopist: Docia Chuck. Henrene Pastor , MD Age: 69 Referring MD:  Date of Birth: 15-Sep-1950 Gender: Female Account #: [id number] Procedure:                Colonoscopy with cold snare polypectomy x 1 Indications:              Screening for colorectal malignant neoplasm.                            Negative index exam elsewhere 2008 Medicines:                Monitored Anesthesia Care Procedure:                Pre-Anesthesia Assessment:                           - Prior to the procedure, a History and Physical                            was performed, and patient medications and                            allergies were reviewed. The patient's tolerance of                            previous anesthesia was also reviewed. The risks                            and benefits of the procedure and the sedation                            options and risks were discussed with the patient.                            All questions were answered, and informed consent                            was obtained. Prior Anticoagulants: The patient has                            taken no previous anticoagulant or antiplatelet                            agents. ASA Grade Assessment: II - A patient with                            mild systemic disease. After reviewing the risks                            and benefits, the patient was deemed in                            satisfactory condition to undergo the procedure.  After obtaining informed consent, the colonoscope                            was passed under direct vision. Throughout the                            procedure, the patient's blood pressure, pulse, and                            oxygen saturations were monitored continuously. The                            Colonoscope was introduced through the anus and      advanced to the the cecum, identified by                            appendiceal orifice and ileocecal valve. The                            terminal ileum, ileocecal valve, appendiceal                            orifice, and rectum were photographed. The quality                            of the bowel preparation was excellent. The                            colonoscopy was performed without difficulty. The                            patient tolerated the procedure well. The bowel                            preparation used was SUPREP via split dose                            instruction. Scope In: 11:47:35 AM Scope Out: 12:01:23 PM Scope Withdrawal Time: 0 hours 10 minutes 1 second  Total Procedure Duration: 0 hours 13 minutes 48 seconds  Findings:                 The terminal ileum appeared normal.                           A 2 mm polyp was found in the ascending colon. The                            polyp was removed with a cold snare. Resection and                            retrieval were complete.                           Multiple small and large-mouthed diverticula were  found in the left colon and right colon.                           Internal hemorrhoids were found during                            retroflexion. The hemorrhoids were large, friable,                            with tendencies to prolapse. Also, external                            hemorrhoids present                           The exam was otherwise without abnormality on                            direct and retroflexion views. Complications:            No immediate complications. Estimated blood loss:                            None. Estimated Blood Loss:     Estimated blood loss: none. Impression:               - One 2 mm polyp in the ascending colon, removed                            with a cold snare. Resected and retrieved.                           - Diverticulosis in the left  colon and in the right                            colon.                           - Internal hemorrhoids. Normal terminal ileum.                           - The examination was otherwise normal on direct                            and retroflexion views. Recommendation:           - Repeat colonoscopy is not recommended for                            surveillance.                           - Patient has a contact number available for                            emergencies. The signs and symptoms of potential  delayed complications were discussed with the                            patient. Return to normal activities tomorrow.                            Written discharge instructions were provided to the                            patient.                           - Resume previous diet.                           - Continue present medications.                           - Await pathology results.                           -Recommend 2 tablespoons of Citrucel daily in 14                            ounces of water                           -Prescribe Anusol HC suppository; #30; 3 refills; 1                            per rectum at night for treatment of hemorrhoids [name] N. Henrene Pastor, MD 06/15/2019 12:10:01 PM This report has been signed electronically.

## 2019-06-15 NOTE — Progress Notes (Signed)
Called to room to assist during endoscopic procedure.  Patient ID and intended procedure confirmed with present staff. Received instructions for my participation in the procedure from the performing physician.  

## 2019-06-15 NOTE — Progress Notes (Signed)
Temp LC V/s DT I have reviewed the patient's medical history in detail and updated the computerized patient record.

## 2019-06-15 NOTE — Progress Notes (Signed)
pt tolerated well. VSS. awake and to recovery. Report given to RN.  

## 2019-06-15 NOTE — Patient Instructions (Signed)
Handouts :  Hemorrhoids, Polyps,, diverticulosis Resume previous diet Continue present medications Await pathology results Start 2 tablespoons of citrucel daily in 14 ounces of water  YOU HAD AN ENDOSCOPIC PROCEDURE TODAY AT [facility]:   Refer to the procedure report that was given to you for any specific questions about what was found during the examination.  If the procedure report does not answer your questions, please call your gastroenterologist to clarify.  If you requested that your care partner not be given the details of your procedure findings, then the procedure report has been included in a sealed envelope for you to review at your convenience later.  YOU SHOULD EXPECT: Some feelings of bloating in the abdomen. Passage of more gas than usual.  Walking can help get rid of the air that was put into your GI tract during the procedure and reduce the bloating. If you had a lower endoscopy (such as a colonoscopy or flexible sigmoidoscopy) you may notice spotting of blood in your stool or on the toilet paper. If you underwent a bowel prep for your procedure, you may not have a normal bowel movement for a few days.  Please Note:  You might notice some irritation and congestion in your nose or some drainage.  This is from the oxygen used during your procedure.  There is no need for concern and it should clear up in a day or so.  SYMPTOMS TO REPORT IMMEDIATELY:   Following lower endoscopy (colonoscopy or flexible sigmoidoscopy):  Excessive amounts of blood in the stool  Significant tenderness or worsening of abdominal pains  Swelling of the abdomen that is new, acute  Fever of 100F or higher   For urgent or emergent issues, a gastroenterologist can be reached at any hour by calling [phone]. Do not use MyChart messaging for urgent concerns.    DIET:  We do recommend a small meal at first, but then you may proceed to your regular diet.  Drink plenty of  fluids but you should avoid alcoholic beverages for 24 hours.  ACTIVITY:  You should plan to take it easy for the rest of today and you should NOT DRIVE or use heavy machinery until tomorrow (because of the sedation medicines used during the test).    FOLLOW UP: Our staff will call the number listed on your records 48-72 hours following your procedure to check on you and address any questions or concerns that you may have regarding the information given to you following your procedure. If we do not reach you, we will leave a message.  We will attempt to reach you two times.  During this call, we will ask if you have developed any symptoms of COVID 19. If you develop any symptoms (ie: fever, flu-like symptoms, shortness of breath, cough etc.) before then, please call [phone].  If you test positive for Covid 19 in the 2 weeks post procedure, please call and report this information to [place].    If any biopsies were taken you will be contacted by phone or by letter within the next 1-3 weeks.  Please call us at [phone] if you have not heard about the biopsies in 3 weeks.    SIGNATURES/CONFIDENTIALITY: You and/or your care partner have signed paperwork which will be entered into your electronic medical record.  These signatures attest to the fact that that the information above on your After Visit Summary has been reviewed and is understood.  Full responsibility of the  confidentiality of this discharge information lies with you and/or your care-partner. 

## 2019-06-16 ENCOUNTER — Telehealth: Payer: Self-pay | Admitting: Rheumatology

## 2019-06-16 ENCOUNTER — Other Ambulatory Visit: Payer: Self-pay | Admitting: *Deleted

## 2019-06-16 NOTE — Telephone Encounter (Signed)
Called to discuss concerns over the phone. No answer and left voicemail requesting a return call.   Mariella Saa, PharmD, [place], [place] Clinical Specialty Pharmacist [phone]  06/16/2019 9:33 AM

## 2019-06-16 NOTE — Telephone Encounter (Signed)
Patient called stating she was returning Amber's call.   

## 2019-06-17 ENCOUNTER — Telehealth: Payer: Self-pay | Admitting: *Deleted

## 2019-06-17 ENCOUNTER — Encounter: Payer: Self-pay | Admitting: Internal Medicine

## 2019-06-17 NOTE — Telephone Encounter (Signed)
  Follow up Call-  Call back number 06/15/2019  Post procedure Call Back phone  # [phone]  Permission to leave phone message Yes  Some recent data might be hidden     Patient questions:  Do you have a fever, pain , or abdominal swelling? No. Pain Score  0 *  Have you tolerated food without any problems? Yes.    Have you been able to return to your normal activities? Yes.    Do you have any questions about your discharge instructions: Diet   No. Medications  No. Follow up visit  No.  Do you have questions or concerns about your Care? No.  Actions: * If pain score is 4 or above: No action needed, pain <4.  1. Have you developed a fever since your procedure? no  2.   Have you had an respiratory symptoms (SOB or cough) since your procedure? no  3.   Have you tested positive for COVID 19 since your procedure no  4.   Have you had any family members/close contacts diagnosed with the COVID 19 since your procedure? no   If yes to any of these questions please route to Joylene John, RN and Erenest Rasher, RN

## 2019-06-17 NOTE — Telephone Encounter (Signed)
Closing Encounter. Will update in previous encounter.

## 2019-06-18 ENCOUNTER — Other Ambulatory Visit (HOSPITAL_COMMUNITY)
Admission: RE | Admit: 2019-06-18 | Discharge: 2019-06-18 | Disposition: A | Discharge status: Home / Self Care | Payer: Medicare Other | Source: Ambulatory Visit | Attending: Pulmonary Disease | Admitting: Pulmonary Disease

## 2019-06-18 DIAGNOSIS — Z20822 Contact with and (suspected) exposure to covid-19: Secondary | ICD-10-CM | POA: Diagnosis not present

## 2019-06-18 DIAGNOSIS — Z01812 Encounter for preprocedural laboratory examination: Secondary | ICD-10-CM | POA: Diagnosis present

## 2019-06-18 LAB — SARS CORONAVIRUS 2 (TAT 6-24 HRS): SARS Coronavirus 2: NEGATIVE

## 2019-06-18 NOTE — Telephone Encounter (Signed)
ATC but no answer.  Left voicemail stating that I am not in the office today and do not have a direct contact number.  Advised patient to call the office and have them send a message on a time that I would be able to reach her today.   Mariella Saa, PharmD, [place], [place] Clinical Specialty Pharmacist [phone]  06/18/2019 8:31 AM

## 2019-06-18 NOTE — Telephone Encounter (Signed)
Was able to contact patient to discuss concerns.  Addressed risk of infection with being on methotrexate and prednisone.  Advised she is at slight increased risk and would need to be extra diligent with good hand hygiene and staying away from people who are sick as well as wearing a mask.  Patient verbalized understanding.  Patient is currently vaccinated.  Advised that the risk of COVID-19 transmission with the situation she has described would be low.  Advised that breakthrough infection rates are very low.  Patient verbalized understanding.  All questions encouraged and answered.  Instructed patient to call with any further questions or concerns.   Mariella Saa, PharmD, [place], CPP Clinical Specialty Pharmacist (Rheumatology and Pulmonology)  06/18/2019 3:59 PM

## 2019-06-21 ENCOUNTER — Other Ambulatory Visit: Payer: Self-pay | Admitting: Pulmonary Disease

## 2019-06-22 ENCOUNTER — Ambulatory Visit (INDEPENDENT_AMBULATORY_CARE_PROVIDER_SITE_OTHER): Payer: Medicare Other | Admitting: Pulmonary Disease

## 2019-06-22 ENCOUNTER — Other Ambulatory Visit (INDEPENDENT_AMBULATORY_CARE_PROVIDER_SITE_OTHER): Payer: Medicare Other

## 2019-06-22 ENCOUNTER — Other Ambulatory Visit: Payer: Self-pay

## 2019-06-22 ENCOUNTER — Ambulatory Visit (INDEPENDENT_AMBULATORY_CARE_PROVIDER_SITE_OTHER)
Admission: RE | Admit: 2019-06-22 | Discharge: 2019-06-22 | Disposition: A | Discharge status: Home / Self Care | Payer: Medicare Other | Source: Ambulatory Visit | Attending: Pulmonary Disease | Admitting: Pulmonary Disease

## 2019-06-22 ENCOUNTER — Ambulatory Visit: Payer: Medicare Other | Admitting: Pulmonary Disease

## 2019-06-22 ENCOUNTER — Encounter: Payer: Self-pay | Admitting: Pulmonary Disease

## 2019-06-22 DIAGNOSIS — H539 Unspecified visual disturbance: Secondary | ICD-10-CM

## 2019-06-22 DIAGNOSIS — R0602 Shortness of breath: Secondary | ICD-10-CM | POA: Diagnosis not present

## 2019-06-22 DIAGNOSIS — Z5181 Encounter for therapeutic drug level monitoring: Secondary | ICD-10-CM

## 2019-06-22 DIAGNOSIS — D869 Sarcoidosis, unspecified: Secondary | ICD-10-CM

## 2019-06-22 DIAGNOSIS — G20A1 Parkinson's disease without dyskinesia, without mention of fluctuations: Secondary | ICD-10-CM

## 2019-06-22 DIAGNOSIS — G20 Parkinson's disease: Secondary | ICD-10-CM

## 2019-06-22 LAB — PULMONARY FUNCTION TEST
DL/VA % pred: 64 %
DL/VA: 2.83 ml/min/mmHg/L
DLCO cor % pred: 64 %
DLCO cor: 9.61 ml/min/mmHg
DLCO unc % pred: 63 %
DLCO unc: 9.55 ml/min/mmHg
FEF 25-75 Post: 1.58 L/sec
FEF 25-75 Pre: 1.59 L/sec
FEF2575-%Change-Post: 0 %
FEF2575-%Pred-Post: 104 %
FEF2575-%Pred-Pre: 105 %
FEV1-%Change-Post: 0 %
FEV1-%Pred-Post: 107 %
FEV1-%Pred-Pre: 107 %
FEV1-Post: 1.75 L
FEV1-Pre: 1.74 L
FEV1FVC-%Change-Post: 5 %
FEV1FVC-%Pred-Pre: 105 %
FEV6-%Change-Post: -5 %
FEV6-%Pred-Post: 100 %
FEV6-%Pred-Pre: 106 %
FEV6-Post: 2.07 L
FEV6-Pre: 2.18 L
FEV6FVC-%Pred-Post: 105 %
FEV6FVC-%Pred-Pre: 105 %
FVC-%Change-Post: -5 %
FVC-%Pred-Post: 95 %
FVC-%Pred-Pre: 100 %
FVC-Post: 2.07 L
FVC-Pre: 2.18 L
Post FEV1/FVC ratio: 84 %
Post FEV6/FVC ratio: 100 %
Pre FEV1/FVC ratio: 80 %
Pre FEV6/FVC Ratio: 100 %
RV % pred: 71 %
RV: 1.3 L
TLC % pred: 91 %
TLC: 3.62 L

## 2019-06-22 LAB — COMPREHENSIVE METABOLIC PANEL
ALT: 4 U/L (ref 0–35)
AST: 13 U/L (ref 0–37)
Albumin: 4.1 g/dL (ref 3.5–5.2)
Alkaline Phosphatase: 53 U/L (ref 39–117)
BUN: 33 mg/dL — ABNORMAL HIGH (ref 6–23)
CO2: 25 mEq/L (ref 19–32)
Calcium: 9.8 mg/dL (ref 8.4–10.5)
Chloride: 101 mEq/L (ref 96–112)
Creatinine, Ser: 1.5 mg/dL — ABNORMAL HIGH (ref 0.40–1.20)
GFR: 34.26 mL/min — ABNORMAL LOW (ref >60.00)
Glucose, Bld: 193 mg/dL — ABNORMAL HIGH (ref 70–99)
Potassium: 4.4 mEq/L (ref 3.5–5.1)
Sodium: 137 mEq/L (ref 135–145)
Total Bilirubin: 0.5 mg/dL (ref 0.2–1.2)
Total Protein: 7 g/dL (ref 6.0–8.3)

## 2019-06-22 LAB — HEPATIC FUNCTION PANEL
ALT: 4 U/L (ref 0–35)
AST: 13 U/L (ref 0–37)
Albumin: 4.1 g/dL (ref 3.5–5.2)
Alkaline Phosphatase: 53 U/L (ref 39–117)
Bilirubin, Direct: 0.1 mg/dL (ref 0.0–0.3)
Total Bilirubin: 0.5 mg/dL (ref 0.2–1.2)
Total Protein: 7 g/dL (ref 6.0–8.3)

## 2019-06-22 NOTE — Patient Instructions (Addendum)
You were seen today by [name], NP  for:   1. Sarcoidosis  Continue methotrexate 12.5 mg weekly Continue daily folic acid Lab work today  Continue prednisone 15 mg daily  2. Visual disturbance  Continue follow-up with ophthalmology  3. Hypercalcemia due to sarcoidosis 4. Therapeutic drug monitoring  - Comp Met (CMET); Future - Hepatic function panel; Future - CBC with Differential/Platelet; Future  [address] [facility]   5. OSA (obstructive sleep apnea)  We recommend that you continue using your CPAP daily >>>Keep up the hard work using your device >>> Goal should be wearing this for the entire night that you are sleeping, at least 4 to 6 hours  Remember:  . Do not drive or operate heavy machinery if tired or drowsy.  . Please notify the supply company and office if you are unable to use your device regularly due to missing supplies or machine being broken.  . Work on maintaining a healthy weight and following your recommended nutrition plan  . Maintain proper daily exercise and movement  . Maintaining proper use of your device can also help improve management of other chronic illnesses such as: Blood pressure, blood sugars, and weight management.   BiPAP/ CPAP Cleaning:  >>>Clean weekly, with Dawn soap, and bottle brush.  Set up to air dry. >>> Wipe mask out daily with wet wipe or towelette    We recommend today:  Orders Placed This Encounter  Procedures  . DG Chest 2 View    Standing Status:   Future    Standing Expiration Date:   12/23/2019    Order Specific Question:   Reason for Exam (SYMPTOM  OR DIAGNOSIS REQUIRED)    Answer:   sarcoid    Order Specific Question:   Preferred imaging location?    Answer:   Internal    Order Specific Question:   Radiology Contrast Protocol - do NOT remove file path    Answer:   [email or web address]  . Comp Met (CMET)    Standing Status:   Future    Standing Expiration  Date:   06/21/2020  . Hepatic function panel    Standing Status:   Future    Standing Expiration Date:   06/21/2020   Orders Placed This Encounter  Procedures  . DG Chest 2 View  . Comp Met (CMET)  . Hepatic function panel   No orders of the defined types were placed in this encounter.   Follow Up:    Return in about 4 weeks (around 07/20/2019), or if symptoms worsen or fail to improve, for Follow up with Wyn Quaker FNP-C.   Please do your part to reduce the spread of COVID-19:      Reduce your risk of any infection  and COVID19 by using the similar precautions used for avoiding the common cold or flu:  Marland Kitchen Wash your hands often with soap and warm water for at least 20 seconds.  If soap and water are not readily available, use an alcohol-based hand sanitizer with at least 60% alcohol.  . If coughing or sneezing, cover your mouth and nose by coughing or sneezing into the elbow areas of your shirt or coat, into a tissue or into your sleeve (not your hands). Langley Gauss A MASK when in public  . Avoid shaking hands with others and consider head nods or verbal greetings only. . Avoid touching your eyes, nose, or mouth with unwashed hands.  . Avoid close contact with  people who are sick. . Avoid places or events with large numbers of people in one location, like concerts or sporting events. . If you have some symptoms but not all symptoms, continue to monitor at home and seek medical attention if your symptoms worsen. . If you are having a medical emergency, call 911.   [facility] / e-Visit: [email or web address]         [facility]: [facility]: [phone]                   [facility]: [phone]     It is flu season:   >>> Best ways to protect herself from the flu: Receive the yearly flu vaccine, practice good hand hygiene  washing with soap and also using hand sanitizer when available, eat a nutritious meals, get adequate rest, hydrate appropriately   Please contact the office if your symptoms worsen or you have concerns that you are not improving.   Thank you for choosing [facility] for your healthcare, and for allowing [place] to partner with you on your healthcare journey. I am thankful to be able to provide care to you today.   Wyn Quaker FNP-C

## 2019-06-22 NOTE — Assessment & Plan Note (Signed)
Plan: Lab work today Chest x-ray today  

## 2019-06-22 NOTE — Progress Notes (Signed)
PFT done today. 

## 2019-06-22 NOTE — Progress Notes (Signed)
Virtual Visit via Telephone Note  I connected with Destiny Roth on 06/22/19 at  2:30 PM EDT by telephone and verified that I am speaking with the correct person using two identifiers.  Location: Patient: Home Provider: Office [facility] - [id number], [place], [place], [place] [address]   I discussed the limitations, risks, security and privacy concerns of performing an evaluation and management service by telephone and the availability of in person appointments. I also discussed with the patient that there may be a patient responsible charge related to this service. The patient expressed understanding and agreed to proceed.  Patient consented to consult via telephone: Yes People present and their role in pt care: Pt     History of Present Illness:  69 year old female former smoker followed in our office for sarcoidosis  PMH: Hyperlipidemia, anxiety, depression, IBS, Parkinson's, vertigo, hypercalcemia, sarcoidosis Smoker/ Smoking History: Former smoker Maintenance: Methotrexate, prednisone, folic acid  Pt of: Dr. Loanne Drilling  Chief complaint:    69 year old female former smoker followed in our office for sarcoidosis.  Patient is currently being titrated up on methotrexate.  Current dosing is methotrexate 12.5 mg weekly, daily folic acid as well as prednisone 15 mg daily.  Patient was evaluated by [facility] ophthalmology and is currently preparing to establish with [facility] ophthalmology in [place] with Dr. Brigitte Pulse.  Next appointment is on 07/06/2019.  Patient continues to have visual impairment and floaters.  She does feel that some of her floaters especially a large right high floater has improved with the increase of methotrexate.  Patient is also followed by [facility] neurology for parkinsonism.  She reports that she has had worsened upper extremity trembling.  She has not yet followed up with [facility] neurology regarding this.  Patient completed pulmonary function testing  today.  Observations/Objective:  Imaging: CXR 02/23/19 - interval resolutions of bilateral pleural effusions  CT Chest 02/09/19 - Bilateral pleural effusions. Mild ground glass opacifications bilaterally. No enlarged mediastinal or hilar adenopathy.  PFT:  12/17/13 FVC 2.26 (86%) FEV1 1.88 (93%) Ratio 78  TLC 70% DLCO 75% Interpretation: Mild restrictive defect with mild reduction in gas exchange (uncorrected DLCO)  06/22/2019-pulmonary function test-FVC 2.18 (100% predicted), postbronchodilator ratio 84, postbronchodilator FEV1 1.75 (107% predicted), no bronchodilator response, DLCO 9.55 (63% predicted)  Echocardiogram: 04/22/19 - EF 60-65%. Grade I DD. No WMA or valvular abnormalities.   Social History   Tobacco Use  Smoking Status Passive Smoke Exposure - Never Smoker  Smokeless Tobacco Never Used   Immunization History  Administered Date(s) Administered  . Influenza, High Dose Seasonal PF 11/30/2015, 11/16/2016, 10/29/2017, 10/30/2018  . Influenza, Seasonal, Injecte, Preservative Fre 10/20/2012  . Influenza,inj,Quad PF,6+ Mos 11/18/2013  . Influenza-Unspecified 11/18/2013, 11/30/2014, 10/29/2017, 10/29/2018  . Moderna SARS-COVID-2 Vaccination 03/12/2019, 04/09/2019  . Pneumococcal Conjugate-13 01/01/2013  . Pneumococcal Polysaccharide-23 01/05/2014  . Tdap 11/06/2010  . Zoster 11/06/2010  . Zoster Recombinat (Shingrix) 01/10/2018, 03/31/2018      Assessment and Plan:  PD (Parkinson's disease) ([place]) Plan: Discussed with patient she needs to contact [facility] neurology and notify them that she is having worsened upper extremity trembling  Therapeutic drug monitoring Plan: Lab work today Chest x-ray today  Sarcoidosis Reviewed pulmonary function testing  Plan: Continue forward to establish care with [facility] ophthalmology Lab work today Continue methotrexate, will increase based off of lab work Continue prednisone 15 mg, will decrease based off of lab  work Chest x-ray today    Follow Up Instructions:  Return in  about 4 weeks (around 07/20/2019), or if symptoms worsen or fail to improve, for Follow up with Wyn Quaker FNP-C.   I discussed the assessment and treatment plan with the patient. The patient was provided an opportunity to ask questions and all were answered. The patient agreed with the plan and demonstrated an understanding of the instructions.   The patient was advised to call back or seek an in-person evaluation if the symptoms worsen or if the condition fails to improve as anticipated.  I provided 32 minutes of non-face-to-face time during this encounter.   Destiny Rinne, NP

## 2019-06-22 NOTE — Assessment & Plan Note (Signed)
Plan: Discussed with patient she needs to contact [facility] neurology and notify them that she is having worsened upper extremity trembling

## 2019-06-22 NOTE — Assessment & Plan Note (Signed)
Reviewed pulmonary function testing  Plan: Continue forward to establish care with [facility] ophthalmology Lab work today Continue methotrexate, will increase based off of lab work Continue prednisone 15 mg, will decrease based off of lab work Chest x-ray today

## 2019-06-23 MED ORDER — METHOTREXATE 2.5 MG PO TABS: 12.5000 mg | ORAL_TABLET | ORAL | 1 refills | Status: DC

## 2019-06-23 MED ORDER — PREDNISONE 5 MG PO TABS: 15.0000 mg | ORAL_TABLET | Freq: Every day | ORAL | 0 refills | Status: DC

## 2019-06-23 NOTE — Progress Notes (Signed)
Patient notified of results, routed to Edrick Oh MD for review. Patient needs an appointment sooner than scheduled.

## 2019-06-23 NOTE — Progress Notes (Signed)
Patient identification verified. Results of recent CXR reviewed.  Per Wyn Quaker NP, your chest x-ray results of come back showing no acute changes. No plan of care changes at this time. Keep follow-up appointment.  Follow-up with our office if symptoms worsen or you do not feel like you are improving under her current regimen.  Patient verbalized understanding of results and ongoing plan of care.

## 2019-06-23 NOTE — Addendum Note (Signed)
Addended by: [name] on: 06/23/2019 12:18 PM   Modules accepted: Orders

## 2019-07-06 DIAGNOSIS — H4710 Unspecified papilledema: Secondary | ICD-10-CM | POA: Insufficient documentation

## 2019-07-06 DIAGNOSIS — H30033 Focal chorioretinal inflammation, peripheral, bilateral: Secondary | ICD-10-CM | POA: Insufficient documentation

## 2019-07-06 DIAGNOSIS — H3581 Retinal edema: Secondary | ICD-10-CM | POA: Insufficient documentation

## 2019-07-07 ENCOUNTER — Ambulatory Visit: Payer: Medicare Other | Admitting: Pulmonary Disease

## 2019-07-16 NOTE — H&P (Signed)
[facility] History & Physical  02/03/2019 Destiny Splinter, MD  Destiny Roth DOB: [birth year]   Chief Complaint: Gen weakness HPI: The patient is Roth 69 y.o. year-old with progessive weakness over the past week or so, now can't get OOB so brought to ED.  No fevers, chills, cough, SOB or CP. In ED creat was 1.8 and Ca 14.9, Hb normal, no wt loss she knows of. Asked to see for admission.   Patient denies abd pain, wt loss , n/v, has had some diarrhea.  No CP or SOB or cough, no bone pains, back pains. Loss of feeling in feet due to neuropathy.   Talked w/ son , pt takes 1200 mg Ca daily , also 400 mg Ca in her MVI and taking vit D 1000 u per day.  No hx cancer, DVT.     Past Medical History  Past Medical History:  Diagnosis Date  . Abdominal pain, epigastric 09/18/2009  . Anosmia 11/06/2010  . ANXIETY 09/23/2006  . BUNION, RIGHT FOOT 09/18/2009  . CARPAL TUNNEL SYNDROME, BILATERAL 09/23/2006  . DEPRESSION 09/23/2006  . DIVERTICULOSIS, COLON 09/23/2006  . GERD 05/24/2008  . GLUCOSE INTOLERANCE 05/24/2008  . Hemorrhoids   . Hypercalcemia   . HYPERLIPIDEMIA 09/23/2006  . HYPERTENSION 09/23/2006  . IBS 09/23/2006  . Impaired glucose tolerance 11/04/2010  . OSTEOPOROSIS 05/24/2008   06/01/19-pt states osteopenia  . Parkinson's disease ([place])   . Restrictive lung disease 12/20/2013  . Sarcoidosis 02/26/2019   Past Surgical History  Past Surgical History:  Procedure Laterality Date  . CARDIAC CATHETERIZATION    . CATARACT EXTRACTION    . COLONOSCOPY  2008  . COLONOSCOPY W/ BIOPSIES  2008   Dr. Collene Mares -negative random bxs  . ESOPHAGOGASTRODUODENOSCOPY  2008   Dr. Harriette Bouillon duodenal ulcer  . FOOT SURGERY Bilateral 2015  . LEFT AND RIGHT HEART CATHETERIZATION WITH CORONARY ANGIOGRAM N/Roth 11/09/2013   Procedure: LEFT AND RIGHT HEART CATHETERIZATION WITH CORONARY ANGIOGRAM;  Surgeon: Destiny M Martinique, MD;  Location: [facility] CATH LAB;  Service: Cardiovascular;  Laterality: N/Roth;  . nasal  septum repair    . s/p bilat CTS    . s/p fibroid ablation    . s/p ganglion cyst right wrist    . s/p right thumb trigger finger surgury    . SHOULDER SURGERY Right 2015  . TUBAL LIGATION    . UPPER GASTROINTESTINAL ENDOSCOPY     Family History  Family History  Problem Relation Age of Onset  . Peripheral vascular disease Mother        with bilat amputations  . Heart attack Mother   . Hypertension Father   . Heart disease Father 85  . Hypertension Brother   . Heart attack Brother   . Hypertension Brother   . Hypertension Brother   . Colon cancer Neg Hx   . Colon polyps Neg Hx   . Esophageal cancer Neg Hx   . Rectal cancer Neg Hx   . Stomach cancer Neg Hx    Social History  reports that she is Roth non-smoker but has been exposed to tobacco smoke. She has never used smokeless tobacco. She reports previous alcohol use. She reports that she does not use drugs. Allergies  Allergies  Allergen Reactions  . Myrbetriq [Mirabegron] Other (See Comments)    SEVERE muscle and joint pain  . Lipitor [Atorvastatin] Other (See Comments)    Muscle pain  . Zocor [Simvastatin] Other (See Comments)    Muscle pain  Home medications Prior to Admission medications   Medication Sig Start Date End Date Taking? Authorizing Provider  aspirin 81 MG tablet Take 81 mg by mouth daily.   Yes [provider]  carbidopa-levodopa (SINEMET IR) 25-100 MG tablet TAKE ONE TABLET BY MOUTH THREE TIMES DAILY Patient taking differently: Take 1 tablet by mouth See admin instructions. Take 1 tablet by mouth three times Roth day- 8 AM, 12 PM, and between 5-6 PM 10/26/15  Yes Tat, Eustace Quail, DO  hydrocortisone (ANUSOL-HC) 25 MG suppository Place 1 suppository (25 mg total) rectally at bedtime as needed for hemorrhoids or itching. Patient not taking: Reported on 06/15/2019 02/21/16  Yes Irene Shipper, MD  rosuvastatin (CRESTOR) 5 MG tablet Take 5 mg by mouth every evening.  01/10/19  Yes [provider]   sertraline (ZOLOFT) 100 MG tablet Take 100 mg by mouth daily. 01/10/19  Yes [provider]  acetaminophen (TYLENOL) 500 MG tablet Take 1,000 mg by mouth every 6 (six) hours as needed for mild pain.    [provider]  Amantadine HCl 100 MG tablet Take 1 tablet (100 mg total) by mouth daily. 02/11/19   Destiny Sells, MD  amLODipine (NORVASC) 10 MG tablet Take 1 tablet (10 mg total) by mouth daily. 02/12/19   Destiny Sells, MD  Carbidopa-Levodopa ER (SINEMET CR) 25-100 MG tablet controlled release Take 1 tablet by mouth 2 (two) times daily. 02/26/19   Regalado, Belkys A, MD  cyanocobalamin 100 MCG tablet Take 1 tablet (100 mcg total) by mouth daily. 02/26/19   Regalado, Belkys A, MD  dicyclomine (BENTYL) 20 MG tablet Take 20 mg by mouth 4 (four) times daily as needed for spasms.    [provider]  diphenhydrAMINE (BENADRYL) 25 MG tablet Take 25 mg by mouth at bedtime as needed for sleep.    [provider]  diphenhydramine-acetaminophen (TYLENOL PM) 25-500 MG TABS tablet Take 1 tablet by mouth at bedtime as needed.    [provider]  folic acid (FOLVITE) 1 MG tablet TAKE 1 TABLET BY MOUTH EVERY DAY 05/27/19   Margaretha Seeds, MD  gabapentin (NEURONTIN) 100 MG capsule Take 100 mg by mouth 3 (three) times daily. Can take 200mg  tid as needed    [provider]  hydrocortisone (ANUSOL-HC) 25 MG suppository Place 1 suppository (25 mg total) rectally 2 (two) times daily. One per rectum at night for treatment of hemorrhoids 06/15/19   Irene Shipper, MD  methotrexate (RHEUMATREX) 2.5 MG tablet Take 5 tablets (12.5 mg total) by mouth once Roth week. Caution:Chemotherapy. Protect from light. 06/23/19   Destiny Rinne, NP  potassium chloride SA (KLOR-CON) 20 MEQ tablet Take 1 tablet (20 mEq total) by mouth daily. 02/26/19   Regalado, Belkys A, MD  predniSONE (DELTASONE) 5 MG tablet Take 3 tablets (15 mg total) by mouth daily with breakfast. 06/23/19    Destiny Rinne, NP  rOPINIRole (REQUIP) 1 MG tablet Take 1 tablet (1 mg total) by mouth 3 (three) times daily. Patient taking differently: Take 1 mg by mouth See admin instructions. Take 1 mg by mouth three times Roth day- 8 AM, 12 PM, and between 5-6 PM 02/11/19   Destiny Sells, MD  senna (SENOKOT) 8.6 MG TABS tablet Take 1 tablet (8.6 mg total) by mouth 2 (two) times daily. Patient not taking: Reported on 06/15/2019 02/26/19   Elmarie Shiley, MD       Exam Gen alert , Ox 3 w/ guidance No  rash, cyanosis or gangrene Sclera anicteric, throat clear and slightly dry  No jvd or bruits Chest clear bilat to bases RRR no MRG Abd soft ntnd no mass or ascites +bs GU defer MS no joint effusions or deformity Ext no LE or UE edema, no wounds or ulcers Neuro is alert, Ox 3 , nf   Assessment/ Plan: 1. Hypercalcemia - unlikely cancer in patient w/o prior history.  Is on Ca++ supplements she thinks were increased 6 mos ago (1200 mg Ca), also Ca++ in her MVI and also taking vit D. She is not anemic to suggest myeloma, but will send off serologies. She is on HCTZ, there may be cycle of vol depletion/ Ca overload and AKI.  Plan is hold diuretics and all Ca++/ vit D for now, give NS 125/hr + bolus, send pth/ pth-rp, vit D and f/u labs in am.   2. AKI - creat 1.8, looks dry, as above give IVF's and hold HCTZ 3. Parkinson's - on 5 /day sinemet, amantadine 4. Depression - cont meds 5. HTN - hold thiazide     Destiny Splinter, MD   [facility] 02/03/2019, 3:49 PM

## 2019-07-20 NOTE — Telephone Encounter (Signed)
No specific interaction of concern with the antibiotic being added to that regimen.  Continue medications as prescribed.  If symptoms not improving with your UTI please follow-up with primary care.  Wyn Quaker, FNP

## 2019-07-20 NOTE — Telephone Encounter (Signed)
Aaron Edelman please advise on pt email, thanks   [place], [employer] sent to [facility],  I have been prescribed Cephalexin 250 MG tablets twice a day for three days for a UTI infection. I am also taking 8 tablets, 2.5 mg each of Methotrexate once weekly, and 10 mg of Prednisone daily. Is there any problem with this?   Thank You,  [name]

## 2019-07-23 ENCOUNTER — Ambulatory Visit (INDEPENDENT_AMBULATORY_CARE_PROVIDER_SITE_OTHER): Payer: Medicare Other | Admitting: Pulmonary Disease

## 2019-07-23 ENCOUNTER — Encounter: Payer: Self-pay | Admitting: Pulmonary Disease

## 2019-07-23 ENCOUNTER — Other Ambulatory Visit: Payer: Self-pay

## 2019-07-23 VITALS — BP 114/68 | HR 62 | Temp 97.4°F | Ht <= 58 in | Wt 129.0 lb

## 2019-07-23 DIAGNOSIS — D869 Sarcoidosis, unspecified: Secondary | ICD-10-CM | POA: Diagnosis not present

## 2019-07-23 DIAGNOSIS — H539 Unspecified visual disturbance: Secondary | ICD-10-CM | POA: Diagnosis not present

## 2019-07-23 DIAGNOSIS — Z5181 Encounter for therapeutic drug level monitoring: Secondary | ICD-10-CM

## 2019-07-23 NOTE — Assessment & Plan Note (Signed)
Plan: Lab work

## 2019-07-23 NOTE — Assessment & Plan Note (Signed)
Plan: Continue follow-up with [facility] ophthalmology

## 2019-07-23 NOTE — Progress Notes (Signed)
_0  ID: Destiny Roth, female    DOB: 1950-09-09, 69 y.o.   MRN: [id number]  Chief Complaint  Patient presents with  . Follow-up    pt needs labs toda y for predinose and methotraxate sh is currently taking. does see dr Brigitte Pulse to end of the month    Referring provider: Leeroy Cha  HPI:  69 year old female former smoker followed in our office for sarcoidosis  PMH: Hyperlipidemia, anxiety, depression, IBS, Parkinson's, vertigo, hypercalcemia, sarcoidosis Smoker/ Smoking History: Former smoker Maintenance: Methotrexate 43m , prednisone, folic acid  Pt of: Dr. ELoanne Drilling 07/23/2019  - Visit   69 year old female former smoker followed in our office for sarcoidosis.  Since last being seen in May/2021 [facility] SManuella Ghazisummary of their last assessment and plan as listed below:  ASSESSMENT/PLAN:  1. Peripheral focal chorioretinal inflammation of both eyes Hepatitis Panel  Anti-DNAse B Antibody (Sendout)  Antineutrophil Cyto Ab (Sendout)  Antinuclear Antibodies, HEp-2 Substrate, IgG, Serum (Sendout)  MHATP  HLA High Res A,B,C,DRB1,DQB1,DPB1 (NGS)  HIV Antigen Antibody Combo  Interleukin-2 Receptor, EIA (Sendout)  Myeloperoxidase Ab (Sendout)  Proteinase 3 Auto Ab (Sendout)  QuantiFERON-TB Gold Plus In Tube (Sendout)  Protein Electrophoresis, Serum  ACE Serum (Sendout)  Anti-CCP  Comprehensive Metabolic Panel  CBC and Differential  RPR (Syphilis Serology)  RA Factor Screen  Lysozyme (Sendout)  Lyme Disease Abs (Panel) (Sendout)  Toxoplasma Abs (Sendout)  methotrexate 2.5 MG tablet  2. Optic nerve edema CANCELED: Optic Disc Photos - OU - Both Eyes  3. Retinal edema Macula OCT, OU  4. Sarcoidosis   1. Peripheral focal chorioretinal inflammation of both eyes  - History of sarcoidosis (diagnosed 01/2019)  - Review of notes from 03/2019 demonstrates optic nerve edema right eye and optic neuropathy left  eye - I have reviewed that systemic immune suppression requires monitoring with high risk labs as there is always the potential for changes in hepatic and hematologic or renal parameters that can be life threatening.  - Increase 12.5 mg Methotrexate weekly to 20 mg weekly today 07/06/19 (started on 05/07/19)  - Continue 1 mg folic acid daily - Currently on 15 mg oral Prednisone - taper 10 mg for 6 weeks, 7.5 mg for 6 weeks, 5 mg for 6 weeks, 2.5 mg for 6 weeks then stop (07/06/19) - I will draw high risk labs today 07/06/2019 - Will likely need to add second agent  - Follow up in 6 weeks for reevaluation   2. Optic nerve edema  - As above   3. Retinal edema - As above   4. Sarcoidosis  - Follows with Dr. ELoanne Drilling pulmonologist  RDwana Melena MD  69 year old female never smoker with passive smoke exposure presenting today as a follow-up visit.  [facility]  They have increased patient's methotrexate.  She is following up with them later on this month.  They are also working to decrease the prednisone.  We will obtain lab work today to currently monitor this.  [facility] K[facility]ney Ou  Patient reporting that nephrology would like the patient to have monthly c-Met, CBC with differential, hepatic function panel.  They are wondering if our office can order these.  We will discuss this today.  Patient still having a significant amount of neurological symptoms.  She is unsure if this is related to her sarcoid.  She reports that she has had intense  facial pressure and feeling like her head is "swelling up".  Ophthalmology does not feel like this is related to her eyes.  She reports that she is seeing 3 ear nose and throat doctors over the last 3-1/2 years.  Her right eye still has a floater present but has not worsened.  Patient reports that her depth perception is still poor.  She is following up with ophthalmology later on  this month.   Questionaires / Pulmonary Flowsheets:   ACT:  No flowsheet data found.  MMRC: mMRC Dyspnea Scale mMRC Score  05/26/2019 2    Epworth:  No flowsheet data found.  Tests:   Imaging: CXR 02/23/19 - interval resolutions of bilateral pleural effusions  CT Chest 02/09/19 - Bilateral pleural effusions. Mild ground glass opacifications bilaterally. No enlarged mediastinal or hilar adenopathy.  PFT:  12/17/13 FVC 2.26 (86%) FEV1 1.88 (93%) Ratio 78  TLC 70% DLCO 75% Interpretation: Mild restrictive defect with mild reduction in gas exchange (uncorrected DLCO)  06/22/2019-pulmonary function test-FVC 2.18 (100% predicted), postbronchodilator ratio 84, postbronchodilator FEV1 1.75 (107% predicted), no bronchodilator response, DLCO 9.55 (63% predicted)  Echocardiogram: 04/22/19 - EF 60-65%. Grade I DD. No WMA or valvular abnormalities.   FENO:  No results found for: NITRICOXIDE  PFT: PFT Results Latest Ref Rng & Units 06/22/2019 11/18/2013  FVC-Pre L 2.18 2.37  FVC-Predicted Pre % 100 90  FVC-Post L 2.07 2.26  FVC-Predicted Post % 95 86  Pre FEV1/FVC % % 80 78  Post FEV1/FCV % % 84 83  FEV1-Pre L 1.74 1.84  FEV1-Predicted Pre % 107 92  FEV1-Post L 1.75 1.88  DLCO UNC% % 63 75  DLCO COR %Predicted % 64 88  TLC L 3.62 3.09  TLC % Predicted % 91 70  RV % Predicted % 71 -2    WALK:  SIX MIN WALK 05/26/2019 01/12/2014  Supplimental Oxygen during Test? (L/min) - No  Tech Comments: pt walked at even pace//sb Moderate pace -complained of fatigue    Imaging: No results found.  Lab Results:  CBC    Component Value Date/Time   WBC 15.6 (H) 05/26/2019 1300   RBC 4.30 05/26/2019 1300   HGB 13.2 05/26/2019 1300   HCT 39.7 05/26/2019 1300   PLT 330.0 05/26/2019 1300   MCV 92.2 05/26/2019 1300   MCH 28.9 02/26/2019 0219   MCHC 33.4 05/26/2019 1300   RDW 15.4 05/26/2019 1300   LYMPHSABS 0.9 05/26/2019 1300   MONOABS 0.3 05/26/2019 1300   EOSABS 0.0 05/26/2019  1300   BASOSABS 0.0 05/26/2019 1300    BMET    Component Value Date/Time   NA 137 06/22/2019 1600   K 4.4 06/22/2019 1600   CL 101 06/22/2019 1600   CO2 25 06/22/2019 1600   GLUCOSE 193 (H) 06/22/2019 1600   BUN 33 (H) 06/22/2019 1600   CREATININE 1.50 (H) 06/22/2019 1600   CREATININE 1.27 (H) 04/27/2019 1604   CALCIUM 9.8 06/22/2019 1600   CALCIUM 9.7 02/05/2019 0739   GFRNONAA 43 (L) 04/27/2019 1604   GFRAA 50 (L) 04/27/2019 1604    BNP No results found for: BNP  ProBNP    Component Value Date/Time   PROBNP 9.0 10/08/2013 1325    Specialty Problems      Pulmonary Problems   OSA (obstructive sleep apnea)    Mild-mod OSA-AHI 12/h, RDI 23/h      DOE (dyspnea on exertion)   Chest congestion      Allergies  Allergen Reactions  .  Myrbetriq [Mirabegron] Other (See Comments)    SEVERE muscle and joint pain  . Lipitor [Atorvastatin] Other (See Comments)    Muscle pain  . Zocor [Simvastatin] Other (See Comments)    Muscle pain    Immunization History  Administered Date(s) Administered  . Influenza, High Dose Seasonal PF 11/30/2015, 11/16/2016, 10/29/2017, 10/30/2018  . Influenza, Seasonal, Injecte, Preservative Fre 10/20/2012  . Influenza,inj,Quad PF,6+ Mos 11/18/2013  . Influenza-Unspecified 11/18/2013, 11/30/2014, 10/29/2017, 10/29/2018  . Moderna SARS-COVID-2 Vaccination 03/12/2019, 04/09/2019  . Pneumococcal Conjugate-13 01/01/2013  . Pneumococcal Polysaccharide-23 01/05/2014  . Tdap 11/06/2010  . Zoster 11/06/2010  . Zoster Recombinat (Shingrix) 01/10/2018, 03/31/2018    Past Medical History:  Diagnosis Date  . Abdominal pain, epigastric 09/18/2009  . Anosmia 11/06/2010  . ANXIETY 09/23/2006  . BUNION, RIGHT FOOT 09/18/2009  . CARPAL TUNNEL SYNDROME, BILATERAL 09/23/2006  . DEPRESSION 09/23/2006  . DIVERTICULOSIS, COLON 09/23/2006  . GERD 05/24/2008  . GLUCOSE INTOLERANCE 05/24/2008  . Hemorrhoids   . Hypercalcemia   . HYPERLIPIDEMIA 09/23/2006  .  HYPERTENSION 09/23/2006  . IBS 09/23/2006  . Impaired glucose tolerance 11/04/2010  . OSTEOPOROSIS 05/24/2008   06/01/19-pt states osteopenia  . Parkinson's disease ([place])   . Restrictive lung disease 12/20/2013  . Sarcoidosis 02/26/2019    Tobacco History: Social History   Tobacco Use  Smoking Status Passive Smoke Exposure - Never Smoker  Smokeless Tobacco Never Used   Counseling given: Yes   Continue to not smoke  Outpatient Encounter Medications as of 07/23/2019  Medication Sig  . acetaminophen (TYLENOL) 500 MG tablet Take 1,000 mg by mouth every 6 (six) hours as needed for mild pain.  . Amantadine HCl 100 MG tablet Take 1 tablet (100 mg total) by mouth daily.  Marland Kitchen amLODipine (NORVASC) 10 MG tablet Take 1 tablet (10 mg total) by mouth daily.  Marland Kitchen aspirin 81 MG tablet Take 81 mg by mouth daily.  . carbidopa-levodopa (SINEMET IR) 25-100 MG tablet TAKE ONE TABLET BY MOUTH THREE TIMES DAILY (Patient taking differently: Take 1 tablet by mouth See admin instructions. Take 1 tablet by mouth four times a day- 8 AM, 12 PM, and between 5-6 PM)  . Carbidopa-Levodopa ER (SINEMET CR) 25-100 MG tablet controlled release Take 1 tablet by mouth 2 (two) times daily.  . cyanocobalamin 100 MCG tablet Take 1 tablet (100 mcg total) by mouth daily.  Marland Kitchen dicyclomine (BENTYL) 20 MG tablet Take 20 mg by mouth 4 (four) times daily as needed for spasms.  . diphenhydrAMINE (BENADRYL) 25 MG tablet Take 25 mg by mouth at bedtime as needed for sleep.  . diphenhydramine-acetaminophen (TYLENOL PM) 25-500 MG TABS tablet Take 1 tablet by mouth at bedtime as needed.  . folic acid (FOLVITE) 1 MG tablet TAKE 1 TABLET BY MOUTH EVERY DAY  . gabapentin (NEURONTIN) 100 MG capsule Take 100 mg by mouth 3 (three) times daily. Can take 248m tid as needed  . methotrexate (RHEUMATREX) 2.5 MG tablet Take 5 tablets (12.5 mg total) by mouth once a week. Caution:Chemotherapy. Protect from light.  . potassium chloride SA (KLOR-CON) 20 MEQ  tablet Take 1 tablet (20 mEq total) by mouth daily.  . predniSONE (DELTASONE) 5 MG tablet Take 3 tablets (15 mg total) by mouth daily with breakfast.  . rOPINIRole (REQUIP) 1 MG tablet Take 1 tablet (1 mg total) by mouth 3 (three) times daily. (Patient taking differently: Take 1 mg by mouth See admin instructions. Take 1 mg by mouth three times a day- 8 AM, 12  PM, and between 5-6 PM)  . rosuvastatin (CRESTOR) 5 MG tablet Take 5 mg by mouth every evening.   . senna (SENOKOT) 8.6 MG TABS tablet Take 1 tablet (8.6 mg total) by mouth 2 (two) times daily.  . sertraline (ZOLOFT) 100 MG tablet Take 100 mg by mouth daily.  . [DISCONTINUED] hydrocortisone (ANUSOL-HC) 25 MG suppository Place 1 suppository (25 mg total) rectally at bedtime as needed for hemorrhoids or itching. (Patient not taking: Reported on 06/15/2019)  . [DISCONTINUED] hydrocortisone (ANUSOL-HC) 25 MG suppository Place 1 suppository (25 mg total) rectally 2 (two) times daily. One per rectum at night for treatment of hemorrhoids   No facility-administered encounter medications on file as of 07/23/2019.     Review of Systems  Review of Systems  Constitutional: Positive for fatigue. Negative for activity change and fever.  HENT: Negative for sinus pressure, sinus pain and sore throat.   Respiratory: Positive for shortness of breath. Negative for cough and wheezing.   Cardiovascular: Negative for chest pain and palpitations.  Gastrointestinal: Negative for diarrhea, nausea and vomiting.  Musculoskeletal: Negative for arthralgias.  Neurological: Positive for weakness and light-headedness. Negative for dizziness.  Psychiatric/Behavioral: Negative for sleep disturbance. The patient is not nervous/anxious.      Physical Exam  BP 114/68 (BP Location: Left Arm, Cuff Size: Normal)   Pulse 62   Temp (!) 97.4 F (36.3 C) (Oral)   Ht _0  (1.448 m)   Wt 129 lb (58.5 kg)   SpO2 97%   BMI 27.92 kg/m   Wt Readings from Last 5 Encounters:   07/23/19 129 lb (58.5 kg)  06/15/19 130 lb (59 kg)  06/01/19 130 lb (59 kg)  05/26/19 130 lb 9.6 oz (59.2 kg)  04/27/19 126 lb 9.6 oz (57.4 kg)    BMI Readings from Last 5 Encounters:  07/23/19 27.92 kg/m  06/15/19 28.13 kg/m  06/01/19 28.13 kg/m  05/26/19 28.26 kg/m  04/27/19 26.46 kg/m     Physical Exam Vitals and nursing note reviewed.  Constitutional:      General: She is not in acute distress.    Appearance: Normal appearance. She is normal weight.  HENT:     Head: Normocephalic and atraumatic.     Right Ear: Tympanic membrane, ear canal and external ear normal. There is no impacted cerumen.     Left Ear: Tympanic membrane, ear canal and external ear normal. There is no impacted cerumen.     Nose: Nose normal. No congestion or rhinorrhea.     Mouth/Throat:     Mouth: Mucous membranes are moist.     Pharynx: Oropharynx is clear.  Eyes:     Pupils: Pupils are equal, round, and reactive to light.  Cardiovascular:     Rate and Rhythm: Normal rate and regular rhythm.     Pulses: Normal pulses.     Heart sounds: Normal heart sounds. No murmur heard.   Pulmonary:     Breath sounds: Normal breath sounds. No decreased air movement. No decreased breath sounds, wheezing or rales.  Musculoskeletal:     Cervical back: Normal range of motion.  Skin:    General: Skin is warm and dry.     Capillary Refill: Capillary refill takes less than 2 seconds.  Neurological:     General: No focal deficit present.     Mental Status: She is alert and oriented to person, place, and time. Mental status is at baseline.     Gait: Gait normal.  Psychiatric:  Mood and Affect: Mood normal.        Behavior: Behavior normal.        Thought Content: Thought content normal.        Judgment: Judgment normal.       Assessment & Plan:   Discussion: Very kind and complex patient.  Patient is established with [facility] ophthalmology we agree with their outlined plan of care.   Patient proceed forward and keep appointments with them.  Patient also to continue follow-up with [facility].  We are okay with ordering the requested lab work every 4 weeks by nephrology.  Patient with a myriad of other symptoms.  Her largest concern today is head pressure as well as brain fog.  She is unsure what could be causing this.  She reports that she is worked with primary care in the past as well as with ENT with no relief.  She feels this may be related to sarcoid.  She is unsure.  We will refer to neurology today to see if they can offer any other insights or further work-up.  Sarcoidosis Established with [facility] ophthalmology Dr. Fabio Bering with [facility]-Dr. Johnney Ou Patient currently maintained on methotrexate 20 mg weekly Patient taking folic acid Patient titrating down on prednisone as outlined by [facility] ophthalmology Planning on starting secondary agent once prednisone is titrated down and with follow-up visit Reviewed pulmonary function testing  Plan: Continue methotrexate 20 mg weekly Continue folic acid Continue to titrate down on prednisone Continue forward with [facility] ophthalmology appointment Continue forward with [facility] Lab work today We will order labs as outlined by [facility] every 4 weeks Follow-up with our office in 6 weeks    Hypercalcemia due to sarcoidosis Plan: Lab work today  Therapeutic drug monitoring Plan: Lab work  Environmental health practitioner disturbance Plan: Continue follow-up with [facility] ophthalmology    Return in about 6 weeks (around 09/03/2019), or if symptoms worsen or fail to improve, for Follow up with Dr. Loanne Drilling.   [name], NP 07/23/2019   This appointment required 50 minutes of patient care (this includes precharting, chart review, review of results, face-to-face care, etc.).

## 2019-07-23 NOTE — Assessment & Plan Note (Signed)
Plan: Lab work today 

## 2019-07-23 NOTE — Assessment & Plan Note (Signed)
Established with [facility] ophthalmology Dr. Fabio Bering with [facility]-Dr. Johnney Ou Patient currently maintained on methotrexate 20 mg weekly Patient taking folic acid Patient titrating down on prednisone as outlined by [facility] ophthalmology Planning on starting secondary agent once prednisone is titrated down and with follow-up visit Reviewed pulmonary function testing  Plan: Continue methotrexate 20 mg weekly Continue folic acid Continue to titrate down on prednisone Continue forward with [facility] ophthalmology appointment Continue forward with [facility] Lab work today We will order labs as outlined by [employer] every 4 weeks Follow-up with our office in 6 weeks

## 2019-07-23 NOTE — Patient Instructions (Addendum)
You were seen today by [name], NP  for:   1. Sarcoidosis  Continue methotrexate 20 mg weekly Continue folic acid Continue to decrease prednisone as outlined by ophthalmology  Lab work today  We will repeat your labs every 4 weeks as requested by nephrology You do not need an appointment to complete these labs please present to our office and you can obtain them  Keep follow-up with nephrology  Follow-up with our office in 6 to 8 weeks  2. Therapeutic drug monitoring  - CBC with Differential/Platelet; Future - Comp Met (CMET); Future   Hepatic panel   3. Visual disturbance  Keep follow up with Dr. Manuella Ghazi    We recommend today:  Orders Placed This Encounter  Procedures  . CBC with Differential/Platelet    Standing Status:   Future    Standing Expiration Date:   07/22/2020  . Comp Met (CMET)    Standing Status:   Future    Standing Expiration Date:   07/22/2020   Orders Placed This Encounter  Procedures  . CBC with Differential/Platelet  . Comp Met (CMET)   No orders of the defined types were placed in this encounter.   Follow Up:    Return in about 6 weeks (around 09/03/2019), or if symptoms worsen or fail to improve, for Follow up with Dr. Loanne Drilling.   Please do your part to reduce the spread of COVID-19:      Reduce your risk of any infection  and COVID19 by using the similar precautions used for avoiding the common cold or flu:  Marland Kitchen Wash your hands often with soap and warm water for at least 20 seconds.  If soap and water are not readily available, use an alcohol-based hand sanitizer with at least 60% alcohol.  . If coughing or sneezing, cover your mouth and nose by coughing or sneezing into the elbow areas of your shirt or coat, into a tissue or into your sleeve (not your hands). Langley Gauss A MASK when in public  . Avoid shaking hands with others and consider head nods or verbal greetings only. . Avoid touching your eyes, nose, or mouth with unwashed hands.    . Avoid close contact with people who are sick. . Avoid places or events with large numbers of people in one location, like concerts or sporting events. . If you have some symptoms but not all symptoms, continue to monitor at home and seek medical attention if your symptoms worsen. . If you are having a medical emergency, call 911.   [place] / e-Visit: [email or web address]         [facility]: [facility]: [phone]                   [facility]: [phone]     It is flu season:   >>> Best ways to protect herself from the flu: Receive the yearly flu vaccine, practice good hand hygiene washing with soap and also using hand sanitizer when available, eat a nutritious meals, get adequate rest, hydrate appropriately   Please contact the office if your symptoms worsen or you have concerns that you are not improving.   Thank you for choosing [facility] for your healthcare, and for allowing [place] to partner with you on your healthcare journey. I am thankful to be able to provide care to you today.   Wyn Quaker FNP-C

## 2019-07-24 LAB — CBC WITH DIFFERENTIAL/PLATELET
Absolute Monocytes: 290 cells/uL (ref 200–950)
Basophils Absolute: 20 cells/uL (ref 0–200)
Basophils Relative: 0.3 %
Eosinophils Absolute: 13 cells/uL — ABNORMAL LOW (ref 15–500)
Eosinophils Relative: 0.2 %
HCT: 35.4 % (ref 35.0–45.0)
Hemoglobin: 11.8 g/dL (ref 11.7–15.5)
Lymphs Abs: 475 cells/uL — ABNORMAL LOW (ref 850–3900)
MCH: 32.8 pg (ref 27.0–33.0)
MCHC: 33.3 g/dL (ref 32.0–36.0)
MCV: 98.3 fL (ref 80.0–100.0)
MPV: 8.6 fL (ref 7.5–12.5)
Monocytes Relative: 4.4 %
Neutro Abs: 5801 cells/uL (ref 1500–7800)
Neutrophils Relative %: 87.9 %
Platelets: 282 10*3/uL (ref 140–400)
RBC: 3.6 10*6/uL — ABNORMAL LOW (ref 3.80–5.10)
RDW: 14.5 % (ref 11.0–15.0)
Total Lymphocyte: 7.2 %
WBC: 6.6 10*3/uL (ref 3.8–10.8)

## 2019-07-24 LAB — COMPREHENSIVE METABOLIC PANEL
AG Ratio: 1.8 (calc) (ref 1.0–2.5)
ALT: 8 U/L (ref 6–29)
AST: 16 U/L (ref 10–35)
Albumin: 4.4 g/dL (ref 3.6–5.1)
Alkaline phosphatase (APISO): 47 U/L (ref 37–153)
BUN/Creatinine Ratio: 20 (calc) (ref 6–22)
BUN: 26 mg/dL — ABNORMAL HIGH (ref 7–25)
CO2: 21 mmol/L (ref 20–32)
Calcium: 9.9 mg/dL (ref 8.6–10.4)
Chloride: 104 mmol/L (ref 98–110)
Creat: 1.28 mg/dL — ABNORMAL HIGH (ref 0.60–0.93)
Globulin: 2.5 g/dL (calc) (ref 1.9–3.7)
Glucose, Bld: 110 mg/dL — ABNORMAL HIGH (ref 65–99)
Potassium: 5.1 mmol/L (ref 3.5–5.3)
Sodium: 137 mmol/L (ref 135–146)
Total Bilirubin: 0.5 mg/dL (ref 0.2–1.2)
Total Protein: 6.9 g/dL (ref 6.1–8.1)

## 2019-08-12 ENCOUNTER — Telehealth: Payer: Self-pay | Admitting: Pulmonary Disease

## 2019-08-12 ENCOUNTER — Other Ambulatory Visit: Payer: Self-pay | Admitting: Pulmonary Disease

## 2019-08-12 DIAGNOSIS — D869 Sarcoidosis, unspecified: Secondary | ICD-10-CM

## 2019-08-12 NOTE — Telephone Encounter (Signed)
Called and spoke with pt letting her know that it would be okay for her f/u to be 8/26 with Dr. Loanne Drilling. Pt verbalized understanding and I scheduled her an aptp on 8/26 with Dr. Loanne Drilling. Nothing further needed.

## 2019-08-26 ENCOUNTER — Other Ambulatory Visit: Payer: Self-pay | Admitting: *Deleted

## 2019-08-26 ENCOUNTER — Other Ambulatory Visit (INDEPENDENT_AMBULATORY_CARE_PROVIDER_SITE_OTHER): Payer: Medicare Other

## 2019-08-26 DIAGNOSIS — D869 Sarcoidosis, unspecified: Secondary | ICD-10-CM

## 2019-08-27 ENCOUNTER — Telehealth: Payer: Self-pay | Admitting: Pulmonary Disease

## 2019-08-27 LAB — COMPREHENSIVE METABOLIC PANEL
ALT: 8 U/L (ref 0–35)
AST: 18 U/L (ref 0–37)
Albumin: 4.4 g/dL (ref 3.5–5.2)
Alkaline Phosphatase: 45 U/L (ref 39–117)
BUN: 29 mg/dL — ABNORMAL HIGH (ref 6–23)
CO2: 27 mEq/L (ref 19–32)
Calcium: 10 mg/dL (ref 8.4–10.5)
Chloride: 104 mEq/L (ref 96–112)
Creatinine, Ser: 1.26 mg/dL — ABNORMAL HIGH (ref 0.40–1.20)
GFR: 41.87 mL/min — ABNORMAL LOW (ref >60.00)
Glucose, Bld: 175 mg/dL — ABNORMAL HIGH (ref 70–99)
Potassium: 4.7 mEq/L (ref 3.5–5.1)
Sodium: 139 mEq/L (ref 135–145)
Total Bilirubin: 0.4 mg/dL (ref 0.2–1.2)
Total Protein: 7.1 g/dL (ref 6.0–8.3)

## 2019-08-27 LAB — CBC WITH DIFFERENTIAL/PLATELET
Basophils Absolute: 0 10*3/uL (ref 0.0–0.1)
Basophils Relative: 0.2 % (ref 0.0–3.0)
Eosinophils Absolute: 0 10*3/uL (ref 0.0–0.7)
Eosinophils Relative: 0.2 % (ref 0.0–5.0)
HCT: 40 % (ref 36.0–46.0)
Hemoglobin: 13.2 g/dL (ref 12.0–15.0)
Lymphocytes Relative: 8 % — ABNORMAL LOW (ref 12.0–46.0)
Lymphs Abs: 0.6 10*3/uL — ABNORMAL LOW (ref 0.7–4.0)
MCHC: 33.1 g/dL (ref 30.0–36.0)
MCV: 97.2 fl (ref 78.0–100.0)
Monocytes Absolute: 0.2 10*3/uL (ref 0.1–1.0)
Monocytes Relative: 3 % (ref 3.0–12.0)
Neutro Abs: 6.3 10*3/uL (ref 1.4–7.7)
Neutrophils Relative %: 88.6 % — ABNORMAL HIGH (ref 43.0–77.0)
Platelets: 288 10*3/uL (ref 150.0–400.0)
RBC: 4.12 Mil/uL (ref 3.87–5.11)
RDW: 15.4 % (ref 11.5–15.5)
WBC: 7.1 10*3/uL (ref 4.0–10.5)

## 2019-08-27 LAB — HEPATIC FUNCTION PANEL
ALT: 8 U/L (ref 0–35)
AST: 18 U/L (ref 0–37)
Albumin: 4.4 g/dL (ref 3.5–5.2)
Alkaline Phosphatase: 45 U/L (ref 39–117)
Bilirubin, Direct: 0.1 mg/dL (ref 0.0–0.3)
Total Bilirubin: 0.4 mg/dL (ref 0.2–1.2)
Total Protein: 7.1 g/dL (ref 6.0–8.3)

## 2019-08-27 NOTE — Progress Notes (Signed)
See recent lab results.

## 2019-08-27 NOTE — Telephone Encounter (Signed)
Results called to patient and routed to nephology. Patient verbalized understanding of results.

## 2019-08-27 NOTE — Progress Notes (Signed)
Patient contacted with results of recent lab work.

## 2019-08-30 ENCOUNTER — Telehealth: Payer: Self-pay | Admitting: Pulmonary Disease

## 2019-08-30 DIAGNOSIS — Z5181 Encounter for therapeutic drug level monitoring: Secondary | ICD-10-CM

## 2019-08-30 NOTE — Telephone Encounter (Signed)
Patient had CBC, BMET, Hepatic function labs last week, she is asking if they should be a standing order to be drawn every month. Please advise.

## 2019-09-01 NOTE — Telephone Encounter (Signed)
This is how they were originally supposed to be placed.  Yes there should be a standing order for those labs to be drawn monthly.  Please coordinate.  Patient can have her next set of labs drawn at her follow-up appointment with Dr. Loanne Drilling this month.  Destiny Quaker, FNP

## 2019-09-02 NOTE — Telephone Encounter (Signed)
Pt called back, aware of recs.  Nothing further needed at this time- will close encounter.

## 2019-09-02 NOTE — Telephone Encounter (Signed)
lmtcb for pt to make aware that labs are supposed to be drawn every month.   Standing lab orders for CBC, BMET, and LFT have been placed.

## 2019-09-19 DIAGNOSIS — G20 Parkinson's disease: Secondary | ICD-10-CM | POA: Insufficient documentation

## 2019-09-20 NOTE — Telephone Encounter (Signed)
ACR recommendation for Covid-19booster vaccine below:  Except for glucocorticoids and anti-cytokine therapies (see footnote),  hold all immunomodulatory or immunosuppressive medications for 1-2 weeks after booster vaccination, assuming disease activity allows.  Is patient controlled enough to hold methotrexate for 1-2 weeks?   Mariella Saa, PharmD, [place], CPP Clinical Specialty Pharmacist (Rheumatology and Pulmonology)  09/20/2019 3:06 PM

## 2019-09-23 ENCOUNTER — Encounter: Payer: Self-pay | Admitting: Pulmonary Disease

## 2019-09-23 ENCOUNTER — Ambulatory Visit (INDEPENDENT_AMBULATORY_CARE_PROVIDER_SITE_OTHER): Payer: Medicare Other | Admitting: Pulmonary Disease

## 2019-09-23 ENCOUNTER — Other Ambulatory Visit: Payer: Self-pay

## 2019-09-23 VITALS — BP 124/76 | HR 94 | Temp 96.7°F | Ht <= 58 in | Wt 127.8 lb

## 2019-09-23 DIAGNOSIS — R5383 Other fatigue: Secondary | ICD-10-CM | POA: Diagnosis not present

## 2019-09-23 DIAGNOSIS — Z5181 Encounter for therapeutic drug level monitoring: Secondary | ICD-10-CM | POA: Diagnosis not present

## 2019-09-23 DIAGNOSIS — D869 Sarcoidosis, unspecified: Secondary | ICD-10-CM

## 2019-09-23 LAB — CBC WITH DIFFERENTIAL/PLATELET
Basophils Absolute: 0 10*3/uL (ref 0.0–0.1)
Basophils Relative: 0.6 % (ref 0.0–3.0)
Eosinophils Absolute: 0 10*3/uL (ref 0.0–0.7)
Eosinophils Relative: 0.3 % (ref 0.0–5.0)
HCT: 39 % (ref 36.0–46.0)
Hemoglobin: 13.1 g/dL (ref 12.0–15.0)
Lymphocytes Relative: 7.8 % — ABNORMAL LOW (ref 12.0–46.0)
Lymphs Abs: 0.6 10*3/uL — ABNORMAL LOW (ref 0.7–4.0)
MCHC: 33.6 g/dL (ref 30.0–36.0)
MCV: 94.9 fl (ref 78.0–100.0)
Monocytes Absolute: 0.3 10*3/uL (ref 0.1–1.0)
Monocytes Relative: 4.2 % (ref 3.0–12.0)
Neutro Abs: 6.1 10*3/uL (ref 1.4–7.7)
Neutrophils Relative %: 87.5 % — ABNORMAL HIGH (ref 43.0–77.0)
Platelets: 314 10*3/uL (ref 150.0–400.0)
RBC: 4.11 Mil/uL (ref 3.87–5.11)
RDW: 14.7 % (ref 11.5–15.5)
WBC: 7 10*3/uL (ref 4.0–10.5)

## 2019-09-23 LAB — HEPATIC FUNCTION PANEL
ALT: 5 U/L (ref 0–35)
AST: 17 U/L (ref 0–37)
Albumin: 4.5 g/dL (ref 3.5–5.2)
Alkaline Phosphatase: 48 U/L (ref 39–117)
Bilirubin, Direct: 0 mg/dL (ref 0.0–0.3)
Total Bilirubin: 0.5 mg/dL (ref 0.2–1.2)
Total Protein: 7.6 g/dL (ref 6.0–8.3)

## 2019-09-23 LAB — BASIC METABOLIC PANEL
BUN: 31 mg/dL — ABNORMAL HIGH (ref 6–23)
CO2: 25 mEq/L (ref 19–32)
Calcium: 10.1 mg/dL (ref 8.4–10.5)
Chloride: 102 mEq/L (ref 96–112)
Creatinine, Ser: 1.25 mg/dL — ABNORMAL HIGH (ref 0.40–1.20)
GFR: 42.25 mL/min — ABNORMAL LOW (ref >60.00)
Glucose, Bld: 142 mg/dL — ABNORMAL HIGH (ref 70–99)
Potassium: 4.4 mEq/L (ref 3.5–5.1)
Sodium: 136 mEq/L (ref 135–145)

## 2019-09-23 NOTE — Progress Notes (Signed)
Subjective:   PATIENT ID: Destiny Roth: female DOB: August 08, 1950, MRN: [id number]   HPI  Chief Complaint  Patient presents with  . Follow-up    chest pressure when doing things    Reason for Visit: Hospital follow-up  [name] is a 69 year old female with Parkinson's disease, HTN, HLD and restrictive lung disease who presents for follow-up for sarcoid management. Husband provides additional history.  Synopsis: Initially seen as an inpatient Pulmonary consult by me on 02/23/19 for hypercalcemia and consideration of bronchoscopy to rule out sarcoid. However due to lack of pulmonary symptoms and normal CT at the time, bronchoscopy thought to be low-yield. Bone biopsy was performed and demonstrated granulomas disease without necrosis. She was started on steroids and discharged with Endocrine and Pulmonary follow-up. Of note, she was previously seen in our Pulmonary clinic in 2018 for dyspnea and mild sleep apnea. Her prior PFTs demonstrate mild restrictive defect with normal DLCO which patient stated was attributed to her Parkinson's disease.  09/23/19 Since our last visit, she has established care with multiple providers including Nephrology and Ophthalmology, the latter who is managing her immunosuppressants. She is currently on tacrolimus 1 mg BID, methotrexate and prednisone. Her prednisone is being weaned. She continues to have fatigue and visual blurriness. Her dyspnea also remains unchanged. Worsens with exertion. Her fatigue seems to be associated with this as well.  Social History: Never smoker  I have personally reviewed patient's past medical/family/social history/allergies/current medications.  Past Medical History:  Diagnosis Date  . Abdominal pain, epigastric 09/18/2009  . Anosmia 11/06/2010  . ANXIETY 09/23/2006  . BUNION, RIGHT FOOT 09/18/2009  . CARPAL TUNNEL SYNDROME, BILATERAL 09/23/2006  . DEPRESSION 09/23/2006  . DIVERTICULOSIS, COLON  09/23/2006  . GERD 05/24/2008  . GLUCOSE INTOLERANCE 05/24/2008  . Hemorrhoids   . Hypercalcemia   . HYPERLIPIDEMIA 09/23/2006  . HYPERTENSION 09/23/2006  . IBS 09/23/2006  . Impaired glucose tolerance 11/04/2010  . OSTEOPOROSIS 05/24/2008   06/01/19-pt states osteopenia  . Parkinson's disease ([place])   . Restrictive lung disease 12/20/2013  . Sarcoidosis 02/26/2019      Outpatient Medications Prior to Visit  Medication Sig Dispense Refill  . acetaminophen (TYLENOL) 500 MG tablet Take 1,000 mg by mouth every 6 (six) hours as needed for mild pain.    . Amantadine HCl 100 MG tablet Take 1 tablet (100 mg total) by mouth daily.    Marland Kitchen amLODipine (NORVASC) 10 MG tablet Take 1 tablet (10 mg total) by mouth daily. 30 tablet 1  . aspirin 81 MG tablet Take 81 mg by mouth daily.    . carbidopa-levodopa (SINEMET IR) 25-100 MG tablet TAKE ONE TABLET BY MOUTH THREE TIMES DAILY (Patient taking differently: Take 1 tablet by mouth See admin instructions. Take 1 tablet by mouth four times a day- 8 AM, 12 PM, and between 5-6 PM) 270 tablet 1  . Carbidopa-Levodopa ER (SINEMET CR) 25-100 MG tablet controlled release Take 1 tablet by mouth 2 (two) times daily. 60 tablet 0  . cyanocobalamin 100 MCG tablet Take 1 tablet (100 mcg total) by mouth daily. 10 tablet 0  . dicyclomine (BENTYL) 20 MG tablet Take 20 mg by mouth 4 (four) times daily as needed for spasms.    . diphenhydrAMINE (BENADRYL) 25 MG tablet Take 25 mg by mouth at bedtime as needed for sleep.    . diphenhydramine-acetaminophen (TYLENOL PM) 25-500 MG TABS tablet Take 1 tablet by mouth at bedtime as needed.    Marland Kitchen  folic acid (FOLVITE) 1 MG tablet TAKE 1 TABLET BY MOUTH EVERY DAY 90 tablet 1  . gabapentin (NEURONTIN) 100 MG capsule Take 100 mg by mouth 3 (three) times daily. Can take 249m tid as needed    . methotrexate (RHEUMATREX) 2.5 MG tablet Take 5 tablets (12.5 mg total) by mouth once a week. Caution:Chemotherapy. Protect from light. 60 tablet 1  .  potassium chloride SA (KLOR-CON) 20 MEQ tablet Take 1 tablet (20 mEq total) by mouth daily. 3 tablet 0  . predniSONE (DELTASONE) 5 MG tablet TAKE 3 TABLETS (15 MG TOTAL) BY MOUTH DAILY WITH BREAKFAST. (Patient taking differently: Take 5 mg by mouth daily with breakfast. ) 90 tablet 0  . rOPINIRole (REQUIP) 1 MG tablet Take 1 tablet (1 mg total) by mouth 3 (three) times daily. (Patient taking differently: Take 1 mg by mouth See admin instructions. Take 1 mg by mouth three times a day- 8 AM, 12 PM, and between 5-6 PM) 90 tablet 0  . rosuvastatin (CRESTOR) 5 MG tablet Take 5 mg by mouth every evening.     . senna (SENOKOT) 8.6 MG TABS tablet Take 1 tablet (8.6 mg total) by mouth 2 (two) times daily. 30 tablet 0  . sertraline (ZOLOFT) 100 MG tablet Take 100 mg by mouth daily.    . tacrolimus (PROGRAF) 1 MG capsule Take 1 mg by mouth 2 (two) times daily.     No facility-administered medications prior to visit.    Review of Systems  Constitutional: Positive for malaise/fatigue. Negative for chills, diaphoresis, fever and weight loss.  HENT: Negative for congestion, ear pain and sore throat.   Eyes: Positive for blurred vision.  Respiratory: Positive for shortness of breath. Negative for cough, hemoptysis, sputum production and wheezing.   Cardiovascular: Negative for chest pain, palpitations and leg swelling.  Gastrointestinal: Negative for abdominal pain, heartburn and nausea.  Genitourinary: Negative for frequency.  Musculoskeletal: Negative for joint pain and myalgias.  Skin: Negative for itching and rash.  Neurological: Negative for dizziness, weakness and headaches.  Endo/Heme/Allergies: Does not bruise/bleed easily.  Psychiatric/Behavioral: Negative for depression. The patient is not nervous/anxious.      Objective:   Vitals:   09/23/19 1536  BP: 124/76  Pulse: 94  Temp: (!) 96.7 F (35.9 C)  TempSrc: Other (Comment)  SpO2: 99%  Weight: 127 lb 12.8 oz (58 kg)  Height: _0   (1.473 m)   SpO2: 99 % (room air) O2 Device: None (Room air)  Physical Exam: General: Well-appearing, no acute distress HENT: [place], AT Eyes: EOMI, no scleral icterus Respiratory: Clear to auscultation bilaterally.  No crackles, wheezing or rales Cardiovascular: RRR, -M/R/G, no JVD GI: BS+, soft, nontender Extremities:-Edema,-tenderness Neuro: AAO x4, CNII-XII grossly intact Skin: Intact, no rashes or bruising Psych: Normal mood, normal affect  Data Reviewed:  Imaging: CXR 02/23/19 - interval resolutions of bilateral pleural effusions  CT Chest 02/09/19 - Bilateral pleural effusions. Mild ground glass opacifications bilaterally. No enlarged mediastinal or hilar adenopathy.  PFT: 12/17/13 FVC 2.26 (86%) FEV1 1.88 (93%) Ratio 78  TLC 70% DLCO 75% Interpretation: Mild restrictive defect with mild reduction in gas exchange (uncorrected DLCO)  06/22/19 FVC 2.07 (95%) FEV1 1.75 (107%) Ratio 80  TLC 91% DLCO 63% Interpretation: No obstructive or restrictive defect. Mild reduction in gas exchange   Echocardiogram: 04/22/19 - EF 60-65%. Grade I DD. No WMA or valvular abnormalities.  Labs: CMP Latest Ref Rng & Units 08/26/2019 08/26/2019 07/23/2019  Glucose 70 - 99 mg/dL 175(H) -  110(H)  BUN 6 - 23 mg/dL 29(H) - 26(H)  Creatinine 0.40 - 1.20 mg/dL 1.26(H) - 1.28(H)  Sodium 135 - 145 mEq/L 139 - 137  Potassium 3.5 - 5.1 mEq/L 4.7 - 5.1  Chloride 96 - 112 mEq/L 104 - 104  CO2 19 - 32 mEq/L 27 - 21  Calcium 8.4 - 10.5 mg/dL 10.0 - 9.9  Total Protein 6.0 - 8.3 g/dL 7.1 7.1 6.9  Total Bilirubin 0.2 - 1.2 mg/dL 0.4 0.4 0.5  Alkaline Phos 39 - 117 U/L 45 45 -  AST 0 - 37 U/L _0 ALT 0 - 35 U/L _1 Assessment & Plan:   Discussion: 69 year old female with Parkinson's disease and extrapulmonary sarcoidosis with endocrine, ocular and renal involvement. Bone biopsy on 02/26/19 (normocellular marrow with noncaseating granulomatous inflammation).  Extrapulmonary sarcoidosis  --Last  PFT in 2015 demonstrated restrictive defect thought to be related to Parkinson's. Recent 2021 PFTs show normal LEFTs --EKG and echo reviewed from 03/2019. PR interval is prolonged and grade I DD present. No other abnormalities --Immunosuppressants per Optho for now: methotrexate, tacrolimus. Weaning prednisone. Checked tacrolimus level per Nephrology's request  Peripheral focal chorioretinal inflammation of both eyes  Optic nerve edema  ---Keep follow-up [facility] Op[facility]  Hypercalcemia secondary to sarcoid - improved --Will need CBC and CMP q 3 months to monitor cell counts and LFTs. Reviewed 07/2019 --Management as above  AKI  --Keep follow-up [facility]: Dr. Johnney Ou --Monthly CBC w/diff, CMP --Obtain 12 hour tacrolimus level  Shortness of breath --No evidence of obstructive and restrictive lung disease. Likely related to deconditioning --Encourage regular exercise  Parkinson's disease --KEEP your follow-up with Neurologist   Health Maintenance Immunization History  Administered Date(s) Administered  . Influenza, High Dose Seasonal PF 11/30/2015, 11/16/2016, 10/29/2017, 10/30/2018  . Influenza, Seasonal, Injecte, Preservative Fre 10/20/2012  . Influenza,inj,Quad PF,6+ Mos 11/18/2013  . Influenza-Unspecified 11/18/2013, 11/30/2014, 10/29/2017, 10/29/2018  . Moderna SARS-COVID-2 Vaccination 03/12/2019, 04/09/2019  . Pneumococcal Conjugate-13 01/01/2013  . Pneumococcal Polysaccharide-23 01/05/2014  . Tdap 11/06/2010  . Zoster 11/06/2010  . Zoster Recombinat (Shingrix) 01/10/2018, 03/31/2018   CT Lung Screen - not qualified  Orders Placed This Encounter  Procedures  . T4, free    Standing Status:   Future    Number of Occurrences:   1    Standing Expiration Date:   09/22/2020    Order Specific Question:   Release to patient    Answer:   Immediate  . Tacrolimus Level    Obtain at 10 am    Standing Status:   Future    Number of Occurrences:   1    Standing Expiration  Date:   09/22/2020    Order Specific Question:   Release to patient    Answer:   Immediate  . TSH    Standing Status:   Future    Number of Occurrences:   1    Standing Expiration Date:   09/22/2020    Order Specific Question:   Release to patient    Answer:   Immediate  . Comp Met (CMET)    To be drawn in September 2021    Standing Status:   Future    Standing Expiration Date:   09/22/2020  No orders of the defined types were placed in this encounter.   No follow-ups on file.  [place], MD [place] [facility] 09/23/2019 3:46 PM  Office Number [phone]

## 2019-09-23 NOTE — Patient Instructions (Addendum)
Extrapulmonary sarcoidosis  --Last PFT in 2015 demonstrated restrictive defect thought to be related to Parkinson's. Recent 2021 PFTs show normal LEFTs --EKG and echo reviewed from 03/2019. PR interval is prolonged and grade I DD present. No other abnormalities --Immunosuppressants per Optho for now: methotrexate, tacrolimus. Weaning prednisone  Peripheral focal chorioretinal inflammation of both eyes  Optic nerve edema  ---Keep follow-up [facility] Op[facility]  Hypercalcemia secondary to sarcoid - improved --Will need CBC and CMP q 3 months to monitor cell counts and LFTs. Reviewed 07/2019 --Management as above  AKI  --Keep follow-up [facility]: Dr. Johnney Ou --Monthly CBC w/diff, CMP --Obtain 12 hour tacrolimus level  Follow-up in 2 months. Will discuss covid vaccination at this visit

## 2019-09-24 ENCOUNTER — Other Ambulatory Visit: Payer: Medicare Other

## 2019-09-24 ENCOUNTER — Other Ambulatory Visit (INDEPENDENT_AMBULATORY_CARE_PROVIDER_SITE_OTHER): Payer: Medicare Other

## 2019-09-24 DIAGNOSIS — R5383 Other fatigue: Secondary | ICD-10-CM

## 2019-09-24 DIAGNOSIS — Z5181 Encounter for therapeutic drug level monitoring: Secondary | ICD-10-CM

## 2019-09-24 LAB — TSH: TSH: 1.17 u[IU]/mL (ref 0.35–4.50)

## 2019-09-24 LAB — TIQ-MISC

## 2019-09-24 LAB — T4, FREE: Free T4: 1.08 ng/dL (ref 0.60–1.60)

## 2019-09-27 NOTE — Telephone Encounter (Signed)
Dr. Loanne Drilling, please see mychart message sent by pt. Thanks!

## 2019-09-29 NOTE — Telephone Encounter (Signed)
Smelser Malta and Lauren,   Please address the lab error before the end of the day. I have already messaged the patient to let her know that she may need to have labs re-drawn. If that is the case, the tacrolimus level needs to be checked 12 hours after her evening dose of the medication.  Rodman Pickle, M.D. [facility] Pulmonary/Critical Care Medicine 09/29/2019 9:22 AM

## 2019-10-01 LAB — TEST AUTHORIZATION

## 2019-10-01 LAB — TACROLIMUS,HIGHLY SENSITIVE,LC/MS/MS: Tacrolimus Lvl: 2.7 mcg/L — ABNORMAL LOW

## 2019-10-02 ENCOUNTER — Telehealth: Payer: Self-pay | Admitting: Pulmonary Disease

## 2019-10-02 NOTE — Telephone Encounter (Signed)
Result from [facility]   Tacrolimus level of 2.7

## 2019-10-05 NOTE — Telephone Encounter (Signed)
Dr. Loanne Drilling please advise  Laurin Coder, MD 3 days ago     Result from [facility]   Tacrolimus level of 2.7

## 2019-10-05 NOTE — Telephone Encounter (Signed)
Noted.  Will close encounter.  

## 2019-10-05 NOTE — Telephone Encounter (Signed)
No action needed. I have already forwarded the results to the patient's nephrologist who requested the testing.

## 2019-10-25 ENCOUNTER — Other Ambulatory Visit (INDEPENDENT_AMBULATORY_CARE_PROVIDER_SITE_OTHER): Payer: Medicare Other

## 2019-10-25 DIAGNOSIS — Z5181 Encounter for therapeutic drug level monitoring: Secondary | ICD-10-CM | POA: Diagnosis not present

## 2019-10-25 LAB — CBC WITH DIFFERENTIAL/PLATELET
Basophils Absolute: 0.1 10*3/uL (ref 0.0–0.1)
Basophils Relative: 0.9 % (ref 0.0–3.0)
Eosinophils Absolute: 0 10*3/uL (ref 0.0–0.7)
Eosinophils Relative: 0.8 % (ref 0.0–5.0)
HCT: 37.9 % (ref 36.0–46.0)
Hemoglobin: 12.7 g/dL (ref 12.0–15.0)
Lymphocytes Relative: 10.9 % — ABNORMAL LOW (ref 12.0–46.0)
Lymphs Abs: 0.7 10*3/uL (ref 0.7–4.0)
MCHC: 33.5 g/dL (ref 30.0–36.0)
MCV: 95.1 fl (ref 78.0–100.0)
Monocytes Absolute: 0.3 10*3/uL (ref 0.1–1.0)
Monocytes Relative: 4.7 % (ref 3.0–12.0)
Neutro Abs: 5.2 10*3/uL (ref 1.4–7.7)
Neutrophils Relative %: 82.7 % — ABNORMAL HIGH (ref 43.0–77.0)
Platelets: 305 10*3/uL (ref 150.0–400.0)
RBC: 3.99 Mil/uL (ref 3.87–5.11)
RDW: 13.9 % (ref 11.5–15.5)
WBC: 6.2 10*3/uL (ref 4.0–10.5)

## 2019-10-25 LAB — COMPREHENSIVE METABOLIC PANEL
ALT: 7 U/L (ref 0–35)
AST: 16 U/L (ref 0–37)
Albumin: 4.3 g/dL (ref 3.5–5.2)
Alkaline Phosphatase: 49 U/L (ref 39–117)
BUN: 25 mg/dL — ABNORMAL HIGH (ref 6–23)
CO2: 25 mEq/L (ref 19–32)
Calcium: 9.6 mg/dL (ref 8.4–10.5)
Chloride: 104 mEq/L (ref 96–112)
Creatinine, Ser: 1.18 mg/dL (ref 0.40–1.20)
GFR: 45.14 mL/min — ABNORMAL LOW (ref >60.00)
Glucose, Bld: 133 mg/dL — ABNORMAL HIGH (ref 70–99)
Potassium: 4.2 mEq/L (ref 3.5–5.1)
Sodium: 139 mEq/L (ref 135–145)
Total Bilirubin: 0.5 mg/dL (ref 0.2–1.2)
Total Protein: 7.2 g/dL (ref 6.0–8.3)

## 2019-10-25 LAB — HEPATIC FUNCTION PANEL
ALT: 6 U/L (ref 0–35)
AST: 17 U/L (ref 0–37)
Albumin: 4.3 g/dL (ref 3.5–5.2)
Alkaline Phosphatase: 50 U/L (ref 39–117)
Bilirubin, Direct: 0.1 mg/dL (ref 0.0–0.3)
Total Bilirubin: 0.5 mg/dL (ref 0.2–1.2)
Total Protein: 7.1 g/dL (ref 6.0–8.3)

## 2019-11-24 ENCOUNTER — Encounter: Payer: Self-pay | Admitting: Pulmonary Disease

## 2019-11-24 ENCOUNTER — Ambulatory Visit (INDEPENDENT_AMBULATORY_CARE_PROVIDER_SITE_OTHER): Payer: Medicare Other | Admitting: Pulmonary Disease

## 2019-11-24 ENCOUNTER — Other Ambulatory Visit: Payer: Medicare Other

## 2019-11-24 ENCOUNTER — Other Ambulatory Visit: Payer: Self-pay

## 2019-11-24 VITALS — BP 136/72 | HR 90 | Temp 97.3°F | Ht <= 58 in | Wt 128.0 lb

## 2019-11-24 DIAGNOSIS — D869 Sarcoidosis, unspecified: Secondary | ICD-10-CM

## 2019-11-24 DIAGNOSIS — Z5181 Encounter for therapeutic drug level monitoring: Secondary | ICD-10-CM

## 2019-11-24 LAB — CBC WITH DIFFERENTIAL/PLATELET
Basophils Absolute: 0.1 10*3/uL (ref 0.0–0.1)
Basophils Relative: 1 % (ref 0.0–3.0)
Eosinophils Absolute: 0.1 10*3/uL (ref 0.0–0.7)
Eosinophils Relative: 1.5 % (ref 0.0–5.0)
HCT: 37.8 % (ref 36.0–46.0)
Hemoglobin: 12.8 g/dL (ref 12.0–15.0)
Lymphocytes Relative: 13.5 % (ref 12.0–46.0)
Lymphs Abs: 0.9 10*3/uL (ref 0.7–4.0)
MCHC: 33.8 g/dL (ref 30.0–36.0)
MCV: 94 fl (ref 78.0–100.0)
Monocytes Absolute: 0.3 10*3/uL (ref 0.1–1.0)
Monocytes Relative: 5.3 % (ref 3.0–12.0)
Neutro Abs: 5.2 10*3/uL (ref 1.4–7.7)
Neutrophils Relative %: 78.7 % — ABNORMAL HIGH (ref 43.0–77.0)
Platelets: 332 10*3/uL (ref 150.0–400.0)
RBC: 4.02 Mil/uL (ref 3.87–5.11)
RDW: 13.9 % (ref 11.5–15.5)
WBC: 6.6 10*3/uL (ref 4.0–10.5)

## 2019-11-24 LAB — HEPATIC FUNCTION PANEL
ALT: 7 U/L (ref 0–35)
AST: 17 U/L (ref 0–37)
Albumin: 4.3 g/dL (ref 3.5–5.2)
Alkaline Phosphatase: 54 U/L (ref 39–117)
Bilirubin, Direct: 0.1 mg/dL (ref 0.0–0.3)
Total Bilirubin: 0.4 mg/dL (ref 0.2–1.2)
Total Protein: 6.9 g/dL (ref 6.0–8.3)

## 2019-11-24 LAB — BASIC METABOLIC PANEL
BUN: 23 mg/dL (ref 6–23)
CO2: 25 mEq/L (ref 19–32)
Calcium: 9.4 mg/dL (ref 8.4–10.5)
Chloride: 102 mEq/L (ref 96–112)
Creatinine, Ser: 1.37 mg/dL — ABNORMAL HIGH (ref 0.40–1.20)
GFR: 38.94 mL/min — ABNORMAL LOW (ref >60.00)
Glucose, Bld: 127 mg/dL — ABNORMAL HIGH (ref 70–99)
Potassium: 4 mEq/L (ref 3.5–5.1)
Sodium: 136 mEq/L (ref 135–145)

## 2019-11-24 NOTE — Progress Notes (Signed)
Subjective:   PATIENT ID: Destiny Roth: female DOB: 07-03-1950, MRN: [id number]   HPI  Chief Complaint  Patient presents with   Follow-up    reports she'd like to discuss side effects of methyltrexate presribed by Dr.Shah. Reports baseline respiratory status except "same feeling but occuring more often"    Reason for Visit: Hospital follow-up  Destiny Roth is a 69 year old female with Parkinson's disease, HTN, HLD and restrictive lung disease who presents for follow-up for sarcoid management.   Synopsis: Initially seen as an inpatient Pulmonary consult by me on 02/23/19 for hypercalcemia and consideration of bronchoscopy to rule out sarcoid. However due to lack of pulmonary symptoms and normal CT at the time, bronchoscopy thought to be low-yield. Bone biopsy was performed and demonstrated granulomas disease without necrosis. She was started on steroids and discharged with Endocrine and Pulmonary follow-up. Of note, she was previously seen in our Pulmonary clinic in 2018 for dyspnea and mild sleep apnea. Her prior PFTs demonstrate mild restrictive defect with normal DLCO which patient stated was attributed to her Parkinson's disease.  09/23/19 Since our last visit, she has established care with multiple providers including Nephrology and Ophthalmology, the latter who is managing her immunosuppressants. She is currently on tacrolimus 1 mg BID, methotrexate and prednisone. Her prednisone is being weaned. She continues to have fatigue and visual blurriness. Her dyspnea also remains unchanged. Worsens with exertion. Her fatigue seems to be associated with this as well.  11/24/19 She is currently on methotrexate 12.5 mg weekly and on prednisone taper. Tacrolimus was discontinued by ophthalmology due tremors. Overall she is not feeling well with difficulty concentrating, balancing and general weakness. She reports visuo-spatial/depth perception issues and tinnitis (L>R)  which she is going to see a sub-specialist in Neurology. 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 She reports unchanged chest pressure and difficulty with deep breaths. Her breaths have to be shallow. This seems to be occurring more frequently. No wheezing or cough.  Social History: Never smoker  I have personally reviewed patient's past medical/family/social history/allergies/current medications.  Past Medical History:  Diagnosis Date   Abdominal pain, epigastric 09/18/2009   Anosmia 11/06/2010   ANXIETY 09/23/2006   BUNION, RIGHT FOOT 09/18/2009   CARPAL TUNNEL SYNDROME, BILATERAL 09/23/2006   DEPRESSION 09/23/2006   DIVERTICULOSIS, COLON 09/23/2006   GERD 05/24/2008   GLUCOSE INTOLERANCE 05/24/2008   Hemorrhoids    Hypercalcemia    HYPERLIPIDEMIA 09/23/2006   HYPERTENSION 09/23/2006   IBS 09/23/2006   Impaired glucose tolerance 11/04/2010   OSTEOPOROSIS 05/24/2008   06/01/19-pt states osteopenia   Parkinson's disease ([facility])    Restrictive lung disease 12/20/2013   Sarcoidosis 02/26/2019      Outpatient Medications Prior to Visit  Medication Sig Dispense Refill   acetaminophen (TYLENOL) 500 MG tablet Take 1,000 mg by mouth every 6 (six) hours as needed for mild pain.     Amantadine HCl 100 MG tablet Take 1 tablet (100 mg total) by mouth daily.     amLODipine (NORVASC) 10 MG tablet Take 1 tablet (10 mg total) by mouth daily. 30 tablet 1   aspirin 81 MG tablet Take 81 mg by mouth daily.     carbidopa-levodopa (SINEMET IR) 25-100 MG tablet TAKE ONE TABLET BY MOUTH THREE TIMES DAILY (Patient taking differently: Take 1 tablet by mouth See admin instructions. Take 1 tablet by mouth four  times a day- 8 AM, 12 PM, and between 5-6 PM) 270 tablet 1   Carbidopa-Levodopa ER (SINEMET CR) 25-100 MG tablet controlled release Take 1 tablet by mouth 2 (two) times daily.  60 tablet 0   cyanocobalamin 100 MCG tablet Take 1 tablet (100 mcg total) by mouth daily. 10 tablet 0   dicyclomine (BENTYL) 20 MG tablet Take 20 mg by mouth 4 (four) times daily as needed for spasms.     diphenhydrAMINE (BENADRYL) 25 MG tablet Take 25 mg by mouth at bedtime as needed for sleep.     diphenhydramine-acetaminophen (TYLENOL PM) 25-500 MG TABS tablet Take 1 tablet by mouth at bedtime as needed.     folic acid (FOLVITE) 1 MG tablet TAKE 1 TABLET BY MOUTH EVERY DAY 90 tablet 1   gabapentin (NEURONTIN) 100 MG capsule Take 100 mg by mouth 3 (three) times daily. Can take 200mg  tid as needed     methotrexate (RHEUMATREX) 2.5 MG tablet Take 5 tablets (12.5 mg total) by mouth once a week. Caution:Chemotherapy. Protect from light. 60 tablet 1   potassium chloride SA (KLOR-CON) 20 MEQ tablet Take 1 tablet (20 mEq total) by mouth daily. 3 tablet 0   rOPINIRole (REQUIP) 1 MG tablet Take 1 tablet (1 mg total) by mouth 3 (three) times daily. (Patient taking differently: Take 1 mg by mouth See admin instructions. Take 1 mg by mouth three times a day- 8 AM, 12 PM, and between 5-6 PM) 90 tablet 0   rosuvastatin (CRESTOR) 5 MG tablet Take 5 mg by mouth every evening.      senna (SENOKOT) 8.6 MG TABS tablet Take 1 tablet (8.6 mg total) by mouth 2 (two) times daily. 30 tablet 0   sertraline (ZOLOFT) 100 MG tablet Take 100 mg by mouth daily.     predniSONE (DELTASONE) 5 MG tablet TAKE 3 TABLETS (15 MG TOTAL) BY MOUTH DAILY WITH BREAKFAST. (Patient taking differently: Take 5 mg by mouth daily with breakfast. ) 90 tablet 0   tacrolimus (PROGRAF) 1 MG capsule Take 1 mg by mouth 2 (two) times daily.     No facility-administered medications prior to visit.    Review of Systems  Constitutional: Negative for chills, diaphoresis, fever, malaise/fatigue and weight loss.  HENT: Negative for congestion.   Eyes:       Depth perception  Respiratory: Positive for shortness of breath. Negative for  cough, hemoptysis, sputum production and wheezing.   Cardiovascular: Positive for chest pain. Negative for palpitations and leg swelling.  Neurological: Positive for tremors and weakness. Negative for focal weakness and loss of consciousness.     Objective:   Vitals:   11/24/19 1514  BP: 136/72  Pulse: 90  Temp: (!) 97.3 F (36.3 C)  SpO2: 97%  Weight: 128 lb (58.1 kg)  Height: 4\' 10"  (1.473 m)   SpO2: 97 % O2 Device: None (Room air)  Physical Exam: General: Well-appearing, no acute distress HENT: [place], AT Eyes: EOMI, no scleral icterus Respiratory: Clear to auscultation bilaterally.  No crackles, wheezing or rales Cardiovascular: RRR, -M/R/G, no JVD Extremities:-Edema,-tenderness Neuro: AAO x4, CNII-XII grossly intact Skin: Intact, no rashes or bruising Psych: Normal mood, normal affect  Data Reviewed:  Imaging: CXR 02/23/19 - interval resolutions of bilateral pleural effusions  CT Chest 02/09/19 - Bilateral pleural effusions. Mild ground glass opacifications bilaterally. No enlarged mediastinal or hilar adenopathy.  PFT: 12/17/13 FVC 2.26 (86%) FEV1 1.88 (93%) Ratio 78  TLC 70% DLCO 75% Interpretation: Mild restrictive defect  with mild reduction in gas exchange (uncorrected DLCO)  06/22/19 FVC 2.07 (95%) FEV1 1.75 (107%) Ratio 80  TLC 91% DLCO 63% Interpretation: No obstructive or restrictive defect. Mild reduction in gas exchange   Echocardiogram: 04/22/19 - EF 60-65%. Grade I DD. No WMA or valvular abnormalities.  Labs: CMP Latest Ref Rng & Units 10/25/2019 10/25/2019 09/23/2019  Glucose 70 - 99 mg/dL - 133(H) 142(H)  BUN 6 - 23 mg/dL - 25(H) 31(H)  Creatinine 0.40 - 1.20 mg/dL - 1.18 1.25(H)  Sodium 135 - 145 mEq/L - 139 136  Potassium 3.5 - 5.1 mEq/L - 4.2 4.4  Chloride 96 - 112 mEq/L - 104 102  CO2 19 - 32 mEq/L - 25 25  Calcium 8.4 - 10.5 mg/dL - 9.6 10.1  Total Protein 6.0 - 8.3 g/dL 7.1 7.2 7.6  Total Bilirubin 0.2 - 1.2 mg/dL 0.5 0.5 0.5  Alkaline  Phos 39 - 117 U/L 50 49 48  AST 0 - 37 U/L 17 16 17   ALT 0 - 35 U/L 6 7 5    Assessment & Plan:   Discussion: 69 year old female with Parkinson's disease and extrapulmonary sarcoidosis with endocrine, ocular and renal involvement. Bone biopsy on 02/26/19 (normocellular marrow with noncaseating granulomatous inflammation). Patient is having difficulty with side effects of methotrexate. I will contact Pharmacy team and Ophthalmology regarding her long-term management. Consider PET to determine if sarcoid if active and if not, will discuss with other providers regarding weaning medications.  Extrapulmonary sarcoidosis  --Last PFT in 2015 demonstrated restrictive defect thought to be related to Parkinson's. Recent 05/2019 PFTs show normal PFTs --EKG and echo reviewed from 03/2019. PR interval is prolonged and grade I DD present. No other abnormalities --Immunosuppressants per Optho for now: methotrexate, weaning prednisone. Off tacrolimus  --Will consider PET to detect active sarcoid activity in the future  Peripheral focal chorioretinal inflammation of both eyes  Optic nerve edema  --Management with immunosuppressants as above --Continue follow-up with Ophthalmology  Hypercalcemia secondary to sarcoid - improved --Will obtain CBC and CMP q3 months to monitor cell counts and LFTs. Reviewed 09/2019 --Management as above  AKI - improved --Keep follow-up [facility]: Dr. Johnney Ou --Monthly CBC w/diff, CMP  Shortness of breath --No evidence of obstructive and restrictive lung disease. Likely related to deconditioning --Will consider PET. Discuss with Dr. Aundra Dubin best location for testing  --Encourage regular exercise  Parkinson's disease  --Not worsening per Neurology (Dr. Lezlie Octave) --Continue follow-up and will see sub-specialist for tinnitis, ?vertigo/balance, visuospatial issues  Health Maintenance Immunization History  Administered Date(s) Administered   Influenza, High Dose  Seasonal PF 11/30/2015, 11/16/2016, 10/29/2017, 10/30/2018   Influenza, Seasonal, Injecte, Preservative Fre 10/20/2012   Influenza,inj,Quad PF,6+ Mos 11/18/2013   Influenza-Unspecified 11/18/2013, 11/30/2014, 10/29/2017, 10/29/2018   Moderna SARS-COVID-2 Vaccination 03/12/2019, 04/09/2019   Pneumococcal Conjugate-13 01/01/2013   Pneumococcal Polysaccharide-23 01/05/2014   Tdap 11/06/2010   Zoster 11/06/2010   Zoster Recombinat (Shingrix) 01/10/2018, 03/31/2018   CT Lung Screen - not qualified  No orders of the defined types were placed in this encounter. No orders of the defined types were placed in this encounter.   Return in about 2 months (around 01/24/2020).  I have spent a total time of 50-minutes on the day of the appointment reviewing prior documentation, coordinating care and discussing medical diagnosis and plan with the patient/family. Imaging, labs and tests included in this note have been reviewed and interpreted independently by me.  [place], MD [facility] 11/24/2019  4:00 PM  Office Number [phone]

## 2019-11-24 NOTE — Patient Instructions (Signed)
69 year old female with Parkinson's disease and extrapulmonary sarcoidosis with endocrine, ocular and renal involvement. Bone biopsy on 02/26/19 (normocellular marrow with noncaseating granulomatous inflammation). Patient is having difficulty with side effects of methotrexate. I will plan to contact Pharmacy team and Ophthalmology regarding her long-term biologic management. Consider PET to determine if sarcoid is active and if not, will discuss with other providers regarding weaning medications.  Extrapulmonary sarcoidosis  --Last PFT in 2015 demonstrated restrictive defect thought to be related to Parkinson's. Recent 05/2019 PFTs show normal PFTs --EKG and echo reviewed from 03/2019. PR interval is prolonged and grade I DD present. No other abnormalities --Immunosuppressants per Optho for now: methotrexate, weaning prednisone. Off tacrolimus  --Will consider PET to detect active sarcoid activity  Peripheral focal chorioretinal inflammation of both eyes  Optic nerve edema  --Management with immunosuppressants as above --Continue follow-up with Ophthalmology  Hypercalcemia secondary to sarcoid - improved --Will obtain CBC and CMP q3 months to monitor cell counts and LFTs. Reviewed 09/2019 --Management as above  AKI - normalized --Keep follow-up [facility]: Dr. Johnney Ou --Monthly CBC w/diff, CMP  Shortness of breath/chest pressure --No evidence of obstructive and restrictive lung disease. Likely related to deconditioning --Will consider PET. Discuss with Dr. Aundra Dubin best location for testing  --Encourage regular exercise  Parkinson's disease  --Not worsening per Neurology (Dr. Lezlie Octave) --Continue follow-up and will see sub-specialist for tinnitis, ?vertigo/balance, visuospatial issues  Follow-up with me in 3 months

## 2019-11-25 ENCOUNTER — Encounter: Payer: Self-pay | Admitting: Pulmonary Disease

## 2019-12-07 ENCOUNTER — Telehealth: Payer: Self-pay | Admitting: Pulmonary Disease

## 2019-12-07 ENCOUNTER — Other Ambulatory Visit: Payer: Self-pay | Admitting: Pulmonary Disease

## 2019-12-07 DIAGNOSIS — D8689 Sarcoidosis of other sites: Secondary | ICD-10-CM

## 2019-12-07 DIAGNOSIS — D869 Sarcoidosis, unspecified: Secondary | ICD-10-CM

## 2019-12-07 NOTE — Telephone Encounter (Signed)
Written order for PET done at [facility] for extrapulmonary sarcoid involving eyes, bone, and renal paperwork given to Ashley Akin RN by Dr. Loanne Drilling.   Order placed per [facility] to get pre-cert if needed.  Insurance cards printed Last ov printed  Patient called to come get her lab work on 12/27/19 since [place] makes Cr checked within 14 days of test, will print and send those once drawn.   Will route to [place] as [place] and add notes

## 2019-12-07 NOTE — Telephone Encounter (Signed)
I have faxed the order to [facility]/PET at [phone].  Will await phone call with appt info for this patient.

## 2019-12-08 NOTE — Telephone Encounter (Signed)
Called and spoke with patient. We spoke about getting her standing labs drawn around 11/29 to give a 14 day window to schedule PET patient states she can come on the 11/30 to have labs drawn.  I let patient know once we had an appointment date she should be getting another call from our office.  Patient voiced understanding.

## 2019-12-28 ENCOUNTER — Other Ambulatory Visit: Payer: Medicare Other

## 2019-12-28 DIAGNOSIS — Z5181 Encounter for therapeutic drug level monitoring: Secondary | ICD-10-CM

## 2019-12-28 LAB — CBC WITH DIFFERENTIAL/PLATELET
Basophils Absolute: 0.1 10*3/uL (ref 0.0–0.1)
Basophils Relative: 0.7 % (ref 0.0–3.0)
Eosinophils Absolute: 0.1 10*3/uL (ref 0.0–0.7)
Eosinophils Relative: 1.1 % (ref 0.0–5.0)
HCT: 41.3 % (ref 36.0–46.0)
Hemoglobin: 13.7 g/dL (ref 12.0–15.0)
Lymphocytes Relative: 12.4 % (ref 12.0–46.0)
Lymphs Abs: 0.9 10*3/uL (ref 0.7–4.0)
MCHC: 33.3 g/dL (ref 30.0–36.0)
MCV: 94.5 fl (ref 78.0–100.0)
Monocytes Absolute: 0.4 10*3/uL (ref 0.1–1.0)
Monocytes Relative: 5.4 % (ref 3.0–12.0)
Neutro Abs: 5.9 10*3/uL (ref 1.4–7.7)
Neutrophils Relative %: 80.4 % — ABNORMAL HIGH (ref 43.0–77.0)
Platelets: 347 10*3/uL (ref 150.0–400.0)
RBC: 4.37 Mil/uL (ref 3.87–5.11)
RDW: 14.2 % (ref 11.5–15.5)
WBC: 7.3 10*3/uL (ref 4.0–10.5)

## 2019-12-28 LAB — BASIC METABOLIC PANEL
BUN: 21 mg/dL (ref 6–23)
CO2: 28 mEq/L (ref 19–32)
Calcium: 9.8 mg/dL (ref 8.4–10.5)
Chloride: 101 mEq/L (ref 96–112)
Creatinine, Ser: 1.49 mg/dL — ABNORMAL HIGH (ref 0.40–1.20)
GFR: 35.19 mL/min — ABNORMAL LOW (ref >60.00)
Glucose, Bld: 107 mg/dL — ABNORMAL HIGH (ref 70–99)
Potassium: 4.3 mEq/L (ref 3.5–5.1)
Sodium: 137 mEq/L (ref 135–145)

## 2019-12-28 LAB — HEPATIC FUNCTION PANEL
ALT: 7 U/L (ref 0–35)
AST: 19 U/L (ref 0–37)
Albumin: 4.4 g/dL (ref 3.5–5.2)
Alkaline Phosphatase: 56 U/L (ref 39–117)
Bilirubin, Direct: 0.1 mg/dL (ref 0.0–0.3)
Total Bilirubin: 0.4 mg/dL (ref 0.2–1.2)
Total Protein: 7.4 g/dL (ref 6.0–8.3)

## 2020-01-17 NOTE — Telephone Encounter (Signed)
email sent from patient  Dr.Ellisom,  I completed the PET scan on 01/07/20 and am wondering if you have had a chance to review the results? Do I need to notify any other of my doctors that the results are in My Chart?  Hopefully, you and your family are enjoying your weekend. Merry Christmas, Dr. Loanne Drilling.  Yours Truly, [name]   Dr. Loanne Drilling please advise on next steps for Ms. Destiny Roth

## 2020-01-18 ENCOUNTER — Other Ambulatory Visit: Payer: Self-pay | Admitting: Internal Medicine

## 2020-01-18 DIAGNOSIS — M8580 Other specified disorders of bone density and structure, unspecified site: Secondary | ICD-10-CM

## 2020-02-01 ENCOUNTER — Other Ambulatory Visit: Payer: Medicare Other

## 2020-02-01 DIAGNOSIS — Z5181 Encounter for therapeutic drug level monitoring: Secondary | ICD-10-CM

## 2020-02-01 LAB — CBC WITH DIFFERENTIAL/PLATELET
Basophils Absolute: 0.1 10*3/uL (ref 0.0–0.1)
Basophils Relative: 0.9 % (ref 0.0–3.0)
Eosinophils Absolute: 0.2 10*3/uL (ref 0.0–0.7)
Eosinophils Relative: 1.9 % (ref 0.0–5.0)
HCT: 40.7 % (ref 36.0–46.0)
Hemoglobin: 13.6 g/dL (ref 12.0–15.0)
Lymphocytes Relative: 13.5 % (ref 12.0–46.0)
Lymphs Abs: 1.1 10*3/uL (ref 0.7–4.0)
MCHC: 33.4 g/dL (ref 30.0–36.0)
MCV: 90.5 fl (ref 78.0–100.0)
Monocytes Absolute: 0.5 10*3/uL (ref 0.1–1.0)
Monocytes Relative: 6.5 % (ref 3.0–12.0)
Neutro Abs: 6.3 10*3/uL (ref 1.4–7.7)
Neutrophils Relative %: 77.2 % — ABNORMAL HIGH (ref 43.0–77.0)
Platelets: 386 10*3/uL (ref 150.0–400.0)
RBC: 4.5 Mil/uL (ref 3.87–5.11)
RDW: 13.4 % (ref 11.5–15.5)
WBC: 8.2 10*3/uL (ref 4.0–10.5)

## 2020-02-01 LAB — HEPATIC FUNCTION PANEL
ALT: 2 U/L (ref 0–35)
AST: 11 U/L (ref 0–37)
Albumin: 4.4 g/dL (ref 3.5–5.2)
Alkaline Phosphatase: 65 U/L (ref 39–117)
Bilirubin, Direct: 0.1 mg/dL (ref 0.0–0.3)
Total Bilirubin: 0.3 mg/dL (ref 0.2–1.2)
Total Protein: 7.6 g/dL (ref 6.0–8.3)

## 2020-02-01 LAB — BASIC METABOLIC PANEL
BUN: 28 mg/dL — ABNORMAL HIGH (ref 6–23)
CO2: 28 mEq/L (ref 19–32)
Calcium: 10 mg/dL (ref 8.4–10.5)
Chloride: 99 mEq/L (ref 96–112)
Creatinine, Ser: 1.34 mg/dL — ABNORMAL HIGH (ref 0.40–1.20)
GFR: 39.94 mL/min — ABNORMAL LOW (ref >60.00)
Glucose, Bld: 115 mg/dL — ABNORMAL HIGH (ref 70–99)
Potassium: 4 mEq/L (ref 3.5–5.1)
Sodium: 137 mEq/L (ref 135–145)

## 2020-02-07 DIAGNOSIS — D869 Sarcoidosis, unspecified: Secondary | ICD-10-CM | POA: Diagnosis not present

## 2020-02-07 DIAGNOSIS — R531 Weakness: Secondary | ICD-10-CM | POA: Diagnosis not present

## 2020-02-07 DIAGNOSIS — G20 Parkinson's disease: Secondary | ICD-10-CM | POA: Diagnosis not present

## 2020-02-07 DIAGNOSIS — R202 Paresthesia of skin: Secondary | ICD-10-CM | POA: Diagnosis not present

## 2020-02-22 ENCOUNTER — Other Ambulatory Visit: Payer: Self-pay

## 2020-02-22 ENCOUNTER — Other Ambulatory Visit: Payer: Medicare Other

## 2020-02-22 ENCOUNTER — Encounter: Payer: Self-pay | Admitting: Pulmonary Disease

## 2020-02-22 ENCOUNTER — Ambulatory Visit (INDEPENDENT_AMBULATORY_CARE_PROVIDER_SITE_OTHER): Payer: Medicare Other | Admitting: Pulmonary Disease

## 2020-02-22 VITALS — BP 110/64 | HR 91 | Temp 97.1°F | Ht <= 58 in | Wt 127.0 lb

## 2020-02-22 DIAGNOSIS — Z5181 Encounter for therapeutic drug level monitoring: Secondary | ICD-10-CM

## 2020-02-22 DIAGNOSIS — Z23 Encounter for immunization: Secondary | ICD-10-CM

## 2020-02-22 DIAGNOSIS — D869 Sarcoidosis, unspecified: Secondary | ICD-10-CM | POA: Diagnosis not present

## 2020-02-22 LAB — BASIC METABOLIC PANEL
BUN: 29 mg/dL — ABNORMAL HIGH (ref 6–23)
CO2: 26 mEq/L (ref 19–32)
Calcium: 10.2 mg/dL (ref 8.4–10.5)
Chloride: 102 mEq/L (ref 96–112)
Creatinine, Ser: 1.25 mg/dL — ABNORMAL HIGH (ref 0.40–1.20)
GFR: 43.39 mL/min — ABNORMAL LOW (ref >60.00)
Glucose, Bld: 113 mg/dL — ABNORMAL HIGH (ref 70–99)
Potassium: 3.8 mEq/L (ref 3.5–5.1)
Sodium: 138 mEq/L (ref 135–145)

## 2020-02-22 LAB — HEPATIC FUNCTION PANEL
ALT: 5 U/L (ref 0–35)
AST: 14 U/L (ref 0–37)
Albumin: 4.5 g/dL (ref 3.5–5.2)
Alkaline Phosphatase: 67 U/L (ref 39–117)
Bilirubin, Direct: 0.1 mg/dL (ref 0.0–0.3)
Total Bilirubin: 0.3 mg/dL (ref 0.2–1.2)
Total Protein: 7.5 g/dL (ref 6.0–8.3)

## 2020-02-22 LAB — CBC WITH DIFFERENTIAL/PLATELET
Basophils Absolute: 0.1 10*3/uL (ref 0.0–0.1)
Basophils Relative: 0.9 % (ref 0.0–3.0)
Eosinophils Absolute: 0.1 10*3/uL (ref 0.0–0.7)
Eosinophils Relative: 2.2 % (ref 0.0–5.0)
HCT: 41.2 % (ref 36.0–46.0)
Hemoglobin: 13.6 g/dL (ref 12.0–15.0)
Lymphocytes Relative: 16.5 % (ref 12.0–46.0)
Lymphs Abs: 1 10*3/uL (ref 0.7–4.0)
MCHC: 33.2 g/dL (ref 30.0–36.0)
MCV: 89.6 fl (ref 78.0–100.0)
Monocytes Absolute: 0.4 10*3/uL (ref 0.1–1.0)
Monocytes Relative: 7.5 % (ref 3.0–12.0)
Neutro Abs: 4.3 10*3/uL (ref 1.4–7.7)
Neutrophils Relative %: 72.9 % (ref 43.0–77.0)
Platelets: 313 10*3/uL (ref 150.0–400.0)
RBC: 4.6 Mil/uL (ref 3.87–5.11)
RDW: 13.7 % (ref 11.5–15.5)
WBC: 5.8 10*3/uL (ref 4.0–10.5)

## 2020-02-22 MED ORDER — SULFAMETHOXAZOLE-TRIMETHOPRIM 400-80 MG PO TABS: 1.0000 | ORAL_TABLET | Freq: Two times a day (BID) | ORAL | 12 refills | Status: DC

## 2020-02-22 MED ORDER — MYCOPHENOLATE MOFETIL 500 MG PO TABS: 500.0000 mg | ORAL_TABLET | Freq: Two times a day (BID) | ORAL | 3 refills | Status: DC

## 2020-02-22 NOTE — Progress Notes (Signed)
Subjective:   PATIENT ID: Destiny Roth: female DOB: 1950-02-26, MRN: [id number]   HPI  Chief Complaint  Patient presents with  . Follow-up    3 month follow up on sarcoidosis.  SHOB with standing and walking and feels a heaviness in her chest.  Rest will make this better.  Trembling more and the cellcept has not helped this much.      Reason for Visit: Hospital follow-up  Destiny Roth is a 70 year old female with Parkinson's disease, HTN, HLD and restrictive lung disease who presents for follow-up for sarcoid management.   Synopsis: Initially seen as an inpatient Pulmonary consult by me on 02/23/19 for hypercalcemia and consideration of bronchoscopy to rule out sarcoid. However due to lack of pulmonary symptoms and normal CT at the time, bronchoscopy thought to be low-yield. Bone biopsy was performed and demonstrated granulomas disease without necrosis. She was started on steroids and discharged with Endocrine and Pulmonary follow-up. Of note, she was previously seen in our Pulmonary clinic in 2018 for dyspnea and mild sleep apnea. Her prior PFTs demonstrate mild restrictive defect with normal DLCO which patient stated was attributed to her Parkinson's disease.  09/23/19 Since our last visit, she has established care with multiple providers including Nephrology and Ophthalmology, the latter who is managing her immunosuppressants. She is currently on tacrolimus 1 mg BID, methotrexate and prednisone. Her prednisone is being weaned. She continues to have fatigue and visual blurriness. Her dyspnea also remains unchanged. Worsens with exertion. Her fatigue seems to be associated with this as well.  11/24/19 She is currently on methotrexate 12.5 mg weekly and on prednisone taper. Tacrolimus was discontinued by ophthalmology due tremors. Overall she is not feeling well with difficulty concentrating, balancing and general weakness. She reports visuo-spatial/depth perception  issues and tinnitis (L>R) which she is going to see a sub-specialist in Neurology. 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 She reports unchanged chest pressure and difficulty with deep breaths. Her breaths have to be shallow. This seems to be occurring more frequently. No wheezing or cough.  02/22/20 She is no longer on methotrexate.  She was started on CellCept in November 2021.  She still continues to have issues with fatigue, tremors and word finding after switching to Cellcept.  She also reports lower extremity aching, no shooting or burning pain. When seen by her neurologist, she reports that her Parkinson's is stable. She was referred to Dr. Yevonne Pax to evaluate for neuro involvement of her sarcoid. She is planned for an MRI of her spine and an EMG to evaluate for neuropathy   Social History: Never smoker  I have personally reviewed patient's past medical/family/social history/allergies/current medications.  Past Medical History:  Diagnosis Date  . Abdominal pain, epigastric 09/18/2009  . Anosmia 11/06/2010  . ANXIETY 09/23/2006  . BUNION, RIGHT FOOT 09/18/2009  . CARPAL TUNNEL SYNDROME, BILATERAL 09/23/2006  . DEPRESSION 09/23/2006  . DIVERTICULOSIS, COLON 09/23/2006  . GERD 05/24/2008  . GLUCOSE INTOLERANCE 05/24/2008  . Hemorrhoids   . Hypercalcemia   . HYPERLIPIDEMIA 09/23/2006  . HYPERTENSION 09/23/2006  . IBS 09/23/2006  . Impaired glucose tolerance 11/04/2010  . OSTEOPOROSIS 05/24/2008   06/01/19-pt states osteopenia  . Parkinson's disease (HCC)   . Restrictive lung disease 12/20/2013  . Sarcoidosis 02/26/2019      Outpatient Medications Prior to Visit  Medication Sig Dispense Refill  . acetaminophen (  TYLENOL) 500 MG tablet Take 1,000 mg by mouth every 6 (six) hours as needed for mild pain.    . Amantadine HCl 100 MG tablet Take 1 tablet (100 mg total) by mouth daily.     Marland Kitchen amLODipine (NORVASC) 10 MG tablet Take 1 tablet (10 mg total) by mouth daily. 30 tablet 1  . aspirin 81 MG tablet Take 81 mg by mouth daily.    . carbidopa-levodopa (SINEMET IR) 25-100 MG tablet TAKE ONE TABLET BY MOUTH THREE TIMES DAILY (Patient taking differently: Take 1 tablet by mouth See admin instructions. Take 1 tablet by mouth four times a day- 8 AM, 12 PM, and between 5-6 PM) 270 tablet 1  . Carbidopa-Levodopa ER (SINEMET CR) 25-100 MG tablet controlled release Take 1 tablet by mouth 2 (two) times daily. 60 tablet 0  . dicyclomine (BENTYL) 20 MG tablet Take 20 mg by mouth 4 (four) times daily as needed for spasms.    . diphenhydrAMINE (BENADRYL) 25 MG tablet Take 25 mg by mouth at bedtime as needed for sleep.    . diphenhydramine-acetaminophen (TYLENOL PM) 25-500 MG TABS tablet Take 1 tablet by mouth at bedtime as needed.    . gabapentin (NEURONTIN) 100 MG capsule Take 100 mg by mouth 3 (three) times daily. Can take 200mg  tid as needed    . potassium chloride SA (KLOR-CON) 20 MEQ tablet Take 1 tablet (20 mEq total) by mouth daily. 3 tablet 0  . rOPINIRole (REQUIP) 1 MG tablet Take 1 tablet (1 mg total) by mouth 3 (three) times daily. (Patient taking differently: Take 1 mg by mouth See admin instructions. Take 1 mg by mouth three times a day- 8 AM, 12 PM, and between 5-6 PM) 90 tablet 0  . rosuvastatin (CRESTOR) 5 MG tablet Take 5 mg by mouth every evening.     . senna (SENOKOT) 8.6 MG TABS tablet Take 1 tablet (8.6 mg total) by mouth 2 (two) times daily. 30 tablet 0  . sertraline (ZOLOFT) 100 MG tablet Take 100 mg by mouth daily.    . cyanocobalamin 100 MCG tablet Take 1 tablet (100 mcg total) by mouth daily. 10 tablet 0  . folic acid (FOLVITE) 1 MG tablet TAKE 1 TABLET BY MOUTH EVERY DAY 90 tablet 1  . methotrexate (RHEUMATREX) 2.5 MG tablet Take 5 tablets (12.5 mg total) by mouth once a week. Caution:Chemotherapy. Protect from light. 60 tablet 1  . predniSONE (DELTASONE) 5 MG tablet  TAKE 3 TABLETS (15 MG TOTAL) BY MOUTH DAILY WITH BREAKFAST. (Patient taking differently: Take 5 mg by mouth daily with breakfast. ) 90 tablet 0   No facility-administered medications prior to visit.    Review of Systems  Constitutional: Positive for malaise/fatigue. Negative for chills, diaphoresis, fever and weight loss.  HENT: Negative for congestion.   Respiratory: Negative for cough, hemoptysis, sputum production, shortness of breath and wheezing.   Cardiovascular: Negative for chest pain, palpitations and leg swelling.  Neurological: Positive for tremors, sensory change and speech change. Negative for loss of consciousness, weakness and headaches.     Objective:   Vitals:   02/22/20 1515  BP: 110/64  Pulse: 91  Temp: (!) 97.1 F (36.2 C)  TempSrc: Tympanic  SpO2: 98%  Weight: 127 lb (57.6 kg)  Height: 4\' 10"  (1.473 m)   SpO2: 98 %  Physical Exam: General: Well-appearing, no acute distress HENT: [place], AT Eyes: EOMI, no scleral icterus Respiratory: Clear to auscultation bilaterally.  No crackles, wheezing or rales Cardiovascular:  RRR, -M/R/G, no JVD Extremities:-Edema,-tenderness Neuro: AAO x4, CNII-XII grossly intact Skin: Intact, no rashes or bruising Psych: Anxious mood, normal affect   Data Reviewed:  Imaging: CXR 02/23/19 - interval resolutions of bilateral pleural effusions CT Chest 02/09/19 - Bilateral pleural effusions. Mild ground glass opacifications bilaterally. No enlarged mediastinal or hilar adenopathy. PET Myocardial 01/07/20 - No evidence of increased uptake PET/CT 01/07/20 1. Bilateral ground-glass pulmonary opacities demonstrate mild-moderate FDG avidity. Thesefindings are nonspecific and may be infectious or inflammatory.2. Mild FDG activity of non-enlarged bilateral hilar lymph nodes is also nonspecific. This could be reactive in the setting of the pulmonary findings, but activity related to sarcoidcannot be excluded.   PFT: 12/17/13 FVC 2.26  (86%) FEV1 1.88 (93%) Ratio 78  TLC 70% DLCO 75% Interpretation: Mild restrictive defect with mild reduction in gas exchange (uncorrected DLCO)  06/22/19 FVC 2.07 (95%) FEV1 1.75 (107%) Ratio 80  TLC 91% DLCO 63% Interpretation: No obstructive or restrictive defect. Mild reduction in gas exchange   Echocardiogram: 04/22/19 - EF 60-65%. Grade I DD. No WMA or valvular abnormalities.  BMET    Component Value Date/Time   NA 138 02/22/2020 1505   K 3.8 02/22/2020 1505   CL 102 02/22/2020 1505   CO2 26 02/22/2020 1505   GLUCOSE 113 (H) 02/22/2020 1505   BUN 29 (H) 02/22/2020 1505   CREATININE 1.25 (H) 02/22/2020 1505   CREATININE 1.28 (H) 07/23/2019 1625   CALCIUM 10.2 02/22/2020 1505   CALCIUM 9.7 02/05/2019 0739   GFRNONAA 43 (L) 04/27/2019 1604   GFRAA 50 (L) 04/27/2019 1604   Imaging, labs and test noted above have been reviewed independently by me.  Assessment & Plan:   Discussion: 70 year old female with Parkinson's disease and sarcoidsosis with endocrine, ocular and renal involvement. Bone biopsy on 02/26/19 (normocellular marrow with noncaseating granulomatous inflammation). Stable respiratory symptoms. Followed by Neurology and undergoing work-up for neurosarcoidosis.  Sarcoidosis  --Last PFT in 2015 demonstrated restrictive defect thought to be related to Parkinson's. Recent 05/2019 PFTs show normal PFTs --EKG and echo reviewed from 03/2019. PR interval is prolonged and grade I DD present. No other abnormalities --Sarcoid PET - negative for cardiac involvement. Suggests pulmonary involvement --Immunosuppressants per Optho for now: Increase Cellcept to 500 mg BID. Started on Bactrim. Will message Dr. Sherryll Burger regarding change  Peripheral focal chorioretinal inflammation of both eyes  Optic nerve edema  --Management with immunosuppressants as above --Continue follow-up with Ophthalmology  Hypercalcemia secondary to sarcoid - within normal-high range --Will obtain CBC and CMP q3  months to monitor cell counts and LFTs. 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 Glenna Fellows --Monthly CBC w/diff, CMP  Parkinson's disease  --Not worsening per Neurology (Dr. Raquel Sarna) --Following Dr. Yevonne Pax at [facility] to evaluate for neurosarcoidoses. Plan for MRI spine and EMG/NCS  Health Maintenance Immunization History  Administered Date(s) Administered  . Influenza, High Dose Seasonal PF 11/30/2015, 11/16/2016, 10/29/2017, 10/30/2018  . Influenza, Seasonal, Injecte, Preservative Fre 10/20/2012  . Influenza,inj,Quad PF,6+ Mos 11/18/2013  . Influenza-Unspecified 11/18/2013, 11/30/2014, 10/29/2017, 10/29/2018  . Moderna Sars-Covid-2 Vaccination 03/12/2019, 04/09/2019  . Pneumococcal Conjugate-13 01/01/2013  . Pneumococcal Polysaccharide-23 01/05/2014  . Tdap 11/06/2010  . Zoster 11/06/2010  . Zoster Recombinat (Shingrix) 01/10/2018, 03/31/2018   CT Lung Screen - not qualified  Orders Placed This Encounter  Procedures  . Pneumococcal polysaccharide vaccine 23-valent greater than or equal to 2yo subcutaneous/IM   Meds ordered this encounter  Medications  .  mycophenolate (CELLCEPT) 500 MG tablet    Sig: Take 1 tablet (500 mg total) by mouth 2 (two) times daily.    Dispense:  60 tablet    Refill:  3  . sulfamethoxazole-trimethoprim (BACTRIM) 400-80 MG tablet    Sig: Take 1 tablet by mouth 2 (two) times daily.    Dispense:  60 tablet    Refill:  12    Return in about 1 month (around 03/24/2020).  I have spent a total time of 40-minutes on the day of the appointment reviewing prior documentation, coordinating care and discussing medical diagnosis and plan with the patient/family. Imaging, labs and tests included in this note have been reviewed and interpreted independently by me.  Jedrick Hutcherson Mechele Collin, MD [facility] 02/22/2020 3:20 PM  Office Number [phone]

## 2020-02-22 NOTE — Patient Instructions (Addendum)
Sarcoid with pulmonary, bone, eye  --Increase cellcept to 500 mg twice a day --Start Bactrim for prophylaxis  --Obtain CBC, BMP  B12 level --Obtain B12 level  Follow-up with me in 2 months. 30 minute visit slot

## 2020-02-25 DIAGNOSIS — Z23 Encounter for immunization: Secondary | ICD-10-CM | POA: Diagnosis not present

## 2020-02-28 NOTE — Telephone Encounter (Signed)
Please see mychart message from patient  Dr. Loanne Drilling,  Thank you for your note and the phone calls ( that  I missed because I was in another part of the house) . I will stop taking the  Bactrim and continue taking the Cellcept, as you directed.  I appreciate  all that you are doing ro help me feel better and to, hopefully, figure out  what is causing my symptoms .  Destiny Roth

## 2020-03-09 DIAGNOSIS — M50222 Other cervical disc displacement at C5-C6 level: Secondary | ICD-10-CM | POA: Diagnosis not present

## 2020-03-09 DIAGNOSIS — M47812 Spondylosis without myelopathy or radiculopathy, cervical region: Secondary | ICD-10-CM | POA: Diagnosis not present

## 2020-03-09 DIAGNOSIS — M48061 Spinal stenosis, lumbar region without neurogenic claudication: Secondary | ICD-10-CM | POA: Diagnosis not present

## 2020-03-09 DIAGNOSIS — D869 Sarcoidosis, unspecified: Secondary | ICD-10-CM | POA: Diagnosis not present

## 2020-03-09 DIAGNOSIS — M4316 Spondylolisthesis, lumbar region: Secondary | ICD-10-CM | POA: Diagnosis not present

## 2020-03-09 DIAGNOSIS — M5021 Other cervical disc displacement,  high cervical region: Secondary | ICD-10-CM | POA: Diagnosis not present

## 2020-03-12 ENCOUNTER — Other Ambulatory Visit: Payer: Self-pay | Admitting: Pulmonary Disease

## 2020-03-14 DIAGNOSIS — H30033 Focal chorioretinal inflammation, peripheral, bilateral: Secondary | ICD-10-CM | POA: Diagnosis not present

## 2020-03-14 DIAGNOSIS — H3581 Retinal edema: Secondary | ICD-10-CM | POA: Diagnosis not present

## 2020-03-14 DIAGNOSIS — H4710 Unspecified papilledema: Secondary | ICD-10-CM | POA: Diagnosis not present

## 2020-03-14 DIAGNOSIS — Z79899 Other long term (current) drug therapy: Secondary | ICD-10-CM | POA: Diagnosis not present

## 2020-03-14 DIAGNOSIS — D869 Sarcoidosis, unspecified: Secondary | ICD-10-CM | POA: Diagnosis not present

## 2020-03-20 DIAGNOSIS — D869 Sarcoidosis, unspecified: Secondary | ICD-10-CM | POA: Diagnosis not present

## 2020-03-20 DIAGNOSIS — G5611 Other lesions of median nerve, right upper limb: Secondary | ICD-10-CM | POA: Diagnosis not present

## 2020-03-20 DIAGNOSIS — R202 Paresthesia of skin: Secondary | ICD-10-CM | POA: Diagnosis not present

## 2020-03-20 DIAGNOSIS — G5601 Carpal tunnel syndrome, right upper limb: Secondary | ICD-10-CM | POA: Diagnosis not present

## 2020-03-29 ENCOUNTER — Other Ambulatory Visit: Payer: Self-pay

## 2020-03-29 ENCOUNTER — Emergency Department (HOSPITAL_COMMUNITY)
Admission: EM | Admit: 2020-03-29 | Discharge: 2020-03-30 | Disposition: A | Discharge status: Home / Self Care | Payer: Medicare Other | Attending: Emergency Medicine | Admitting: Emergency Medicine

## 2020-03-29 ENCOUNTER — Other Ambulatory Visit: Payer: Medicare Other

## 2020-03-29 ENCOUNTER — Encounter (HOSPITAL_COMMUNITY): Payer: Self-pay

## 2020-03-29 DIAGNOSIS — G20 Parkinson's disease: Secondary | ICD-10-CM | POA: Diagnosis not present

## 2020-03-29 DIAGNOSIS — Z79899 Other long term (current) drug therapy: Secondary | ICD-10-CM | POA: Insufficient documentation

## 2020-03-29 DIAGNOSIS — Z7982 Long term (current) use of aspirin: Secondary | ICD-10-CM | POA: Diagnosis not present

## 2020-03-29 DIAGNOSIS — R443 Hallucinations, unspecified: Secondary | ICD-10-CM | POA: Diagnosis not present

## 2020-03-29 DIAGNOSIS — N3000 Acute cystitis without hematuria: Secondary | ICD-10-CM | POA: Insufficient documentation

## 2020-03-29 DIAGNOSIS — Z7722 Contact with and (suspected) exposure to environmental tobacco smoke (acute) (chronic): Secondary | ICD-10-CM | POA: Diagnosis not present

## 2020-03-29 DIAGNOSIS — Z5181 Encounter for therapeutic drug level monitoring: Secondary | ICD-10-CM

## 2020-03-29 DIAGNOSIS — R531 Weakness: Secondary | ICD-10-CM | POA: Diagnosis not present

## 2020-03-29 DIAGNOSIS — I10 Essential (primary) hypertension: Secondary | ICD-10-CM | POA: Diagnosis not present

## 2020-03-29 DIAGNOSIS — F28 Other psychotic disorder not due to a substance or known physiological condition: Secondary | ICD-10-CM

## 2020-03-29 DIAGNOSIS — R079 Chest pain, unspecified: Secondary | ICD-10-CM | POA: Diagnosis not present

## 2020-03-29 DIAGNOSIS — I517 Cardiomegaly: Secondary | ICD-10-CM | POA: Diagnosis not present

## 2020-03-29 LAB — CBC WITH DIFFERENTIAL/PLATELET
Basophils Absolute: 0 10*3/uL (ref 0.0–0.1)
Basophils Relative: 0.7 % (ref 0.0–3.0)
Eosinophils Absolute: 0.1 10*3/uL (ref 0.0–0.7)
Eosinophils Relative: 1.8 % (ref 0.0–5.0)
HCT: 40.5 % (ref 36.0–46.0)
Hemoglobin: 13.3 g/dL (ref 12.0–15.0)
Lymphocytes Relative: 15.5 % (ref 12.0–46.0)
Lymphs Abs: 1 10*3/uL (ref 0.7–4.0)
MCHC: 32.8 g/dL (ref 30.0–36.0)
MCV: 89.2 fl (ref 78.0–100.0)
Monocytes Absolute: 0.4 10*3/uL (ref 0.1–1.0)
Monocytes Relative: 7.1 % (ref 3.0–12.0)
Neutro Abs: 4.6 10*3/uL (ref 1.4–7.7)
Neutrophils Relative %: 74.9 % (ref 43.0–77.0)
Platelets: 336 10*3/uL (ref 150.0–400.0)
RBC: 4.54 Mil/uL (ref 3.87–5.11)
RDW: 14.1 % (ref 11.5–15.5)
WBC: 6.2 10*3/uL (ref 4.0–10.5)

## 2020-03-29 LAB — BASIC METABOLIC PANEL
BUN: 30 mg/dL — ABNORMAL HIGH (ref 6–23)
CO2: 24 mEq/L (ref 19–32)
Calcium: 10.3 mg/dL (ref 8.4–10.5)
Chloride: 103 mEq/L (ref 96–112)
Creatinine, Ser: 1.47 mg/dL — ABNORMAL HIGH (ref 0.40–1.20)
GFR: 35.7 mL/min — ABNORMAL LOW (ref >60.00)
Glucose, Bld: 103 mg/dL — ABNORMAL HIGH (ref 70–99)
Potassium: 4.3 mEq/L (ref 3.5–5.1)
Sodium: 136 mEq/L (ref 135–145)

## 2020-03-29 LAB — HEPATIC FUNCTION PANEL
ALT: 8 U/L (ref 0–35)
AST: 14 U/L (ref 0–37)
Albumin: 4.4 g/dL (ref 3.5–5.2)
Alkaline Phosphatase: 60 U/L (ref 39–117)
Bilirubin, Direct: 0 mg/dL (ref 0.0–0.3)
Total Bilirubin: 0.3 mg/dL (ref 0.2–1.2)
Total Protein: 7.7 g/dL (ref 6.0–8.3)

## 2020-03-29 NOTE — ED Triage Notes (Signed)
Pt reports that for the past few weeks she has been having bilateral leg weakness, abnormal gait, pt reports that she has sarcoidosis and has recently had MRI and PET scans and unable to find causes. Pt also reports that she also is having CP and hallucinations, seeing things that are not there, denies SI/HI

## 2020-03-30 ENCOUNTER — Emergency Department (HOSPITAL_COMMUNITY): Payer: Medicare Other

## 2020-03-30 ENCOUNTER — Encounter (HOSPITAL_COMMUNITY): Payer: Self-pay | Admitting: Emergency Medicine

## 2020-03-30 DIAGNOSIS — R443 Hallucinations, unspecified: Secondary | ICD-10-CM | POA: Diagnosis not present

## 2020-03-30 DIAGNOSIS — R079 Chest pain, unspecified: Secondary | ICD-10-CM | POA: Diagnosis not present

## 2020-03-30 DIAGNOSIS — I517 Cardiomegaly: Secondary | ICD-10-CM | POA: Diagnosis not present

## 2020-03-30 LAB — URINALYSIS, ROUTINE W REFLEX MICROSCOPIC
Bilirubin Urine: NEGATIVE
Glucose, UA: NEGATIVE mg/dL
Hgb urine dipstick: NEGATIVE
Ketones, ur: NEGATIVE mg/dL
Nitrite: POSITIVE — AB
Protein, ur: NEGATIVE mg/dL
Specific Gravity, Urine: 1.012 (ref 1.005–1.030)
pH: 6 (ref 5.0–8.0)

## 2020-03-30 LAB — BASIC METABOLIC PANEL
Anion gap: 14 (ref 5–15)
BUN: 28 mg/dL — ABNORMAL HIGH (ref 8–23)
CO2: 19 mmol/L — ABNORMAL LOW (ref 22–32)
Calcium: 10 mg/dL (ref 8.9–10.3)
Chloride: 106 mmol/L (ref 98–111)
Creatinine, Ser: 1.49 mg/dL — ABNORMAL HIGH (ref 0.44–1.00)
GFR, Estimated: 37 mL/min — ABNORMAL LOW (ref >60)
Glucose, Bld: 110 mg/dL — ABNORMAL HIGH (ref 70–99)
Potassium: 4 mmol/L (ref 3.5–5.1)
Sodium: 139 mmol/L (ref 135–145)

## 2020-03-30 LAB — RAPID URINE DRUG SCREEN, HOSP PERFORMED
Amphetamines: NOT DETECTED
Barbiturates: NOT DETECTED
Benzodiazepines: NOT DETECTED
Cocaine: NOT DETECTED
Opiates: NOT DETECTED
Tetrahydrocannabinol: NOT DETECTED

## 2020-03-30 LAB — CBC
HCT: 40.8 % (ref 36.0–46.0)
Hemoglobin: 13 g/dL (ref 12.0–15.0)
MCH: 29.2 pg (ref 26.0–34.0)
MCHC: 31.9 g/dL (ref 30.0–36.0)
MCV: 91.7 fL (ref 80.0–100.0)
Platelets: 314 10*3/uL (ref 150–400)
RBC: 4.45 MIL/uL (ref 3.87–5.11)
RDW: 13.2 % (ref 11.5–15.5)
WBC: 5.4 10*3/uL (ref 4.0–10.5)
nRBC: 0 % (ref 0.0–0.2)

## 2020-03-30 LAB — HEPATIC FUNCTION PANEL
ALT: 8 U/L (ref 0–44)
AST: 16 U/L (ref 15–41)
Albumin: 3.8 g/dL (ref 3.5–5.0)
Alkaline Phosphatase: 47 U/L (ref 38–126)
Bilirubin, Direct: 0.1 mg/dL (ref 0.0–0.2)
Indirect Bilirubin: 0.4 mg/dL (ref 0.3–0.9)
Total Bilirubin: 0.5 mg/dL (ref 0.3–1.2)
Total Protein: 6.9 g/dL (ref 6.5–8.1)

## 2020-03-30 LAB — ETHANOL: Alcohol, Ethyl (B): <10 mg/dL (ref <10)

## 2020-03-30 LAB — AMMONIA: Ammonia: 18 umol/L (ref 9–35)

## 2020-03-30 LAB — TROPONIN I (HIGH SENSITIVITY)
Troponin I (High Sensitivity): 3 ng/L (ref <18)
Troponin I (High Sensitivity): 4 ng/L (ref <18)

## 2020-03-30 MED ORDER — CEPHALEXIN 500 MG PO CAPS: 500.0000 mg | ORAL_CAPSULE | Freq: Two times a day (BID) | ORAL | 0 refills | Status: AC

## 2020-03-30 MED ORDER — SODIUM CHLORIDE 0.9 % IV SOLN
1.0000 g | Freq: Once | INTRAVENOUS | Status: AC
  Administered 2020-03-30: 1 g via INTRAVENOUS
  Filled 2020-03-30: qty 10

## 2020-03-30 NOTE — ED Notes (Addendum)
Pt resting without any concerns or complaints.

## 2020-03-30 NOTE — ED Provider Notes (Incomplete)
[facility] EMERGENCY DEPARTMENT Provider Note   CSN: [id number] Arrival date & time: 03/29/20  2333     History Chief Complaint  Patient presents with  . Weakness    Destiny Roth is a 70 y.o. female with a pmh of Sarcoidosis and  Parkinson's disease who presents with weakness and hallucinations. The patient is followed by OP Neurology. She reports that she was hospitalized with similar sxs in the past  And was found to by hypercalcemic (x2). She reports worsening balance. She has been worked up by her Neurologist at [facility] and had MRI C/L spine that showed degenerative changes and L4/5 anterolisthesis. EMG showed minimally abnormal study. She reports that she has had increase in hallucinations. Sometimes she is aware, but other times her husband has to tell her what she is seeing is not real. She has been seeing people and animals that are not presents. He has multiple chronic neurologic abnormalities that are not significantly different.  She denies fever, headaches, urinary sxs.   HPI     Past Medical History:  Diagnosis Date  . Abdominal pain, epigastric 09/18/2009  . Anosmia 11/06/2010  . ANXIETY 09/23/2006  . BUNION, RIGHT FOOT 09/18/2009  . CARPAL TUNNEL SYNDROME, BILATERAL 09/23/2006  . DEPRESSION 09/23/2006  . DIVERTICULOSIS, COLON 09/23/2006  . GERD 05/24/2008  . GLUCOSE INTOLERANCE 05/24/2008  . Hemorrhoids   . Hypercalcemia   . HYPERLIPIDEMIA 09/23/2006  . HYPERTENSION 09/23/2006  . IBS 09/23/2006  . Impaired glucose tolerance 11/04/2010  . OSTEOPOROSIS 05/24/2008   06/01/19-pt states osteopenia  . Parkinson's disease ([place])   . Restrictive lung disease 12/20/2013  . Sarcoidosis 02/26/2019    Patient Active Problem List   Diagnosis Date Noted  . Therapeutic drug monitoring 05/26/2019  . Sarcoidosis 04/27/2019  . Visual disturbance 04/27/2019  . 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 Generalized weakness 02/04/2019  . Vertigo 03/12/2016  .  Peripheral edema 07/09/2014  . DOE (dyspnea on exertion) 11/18/2013  . Hot flashes 11/18/2013  . Chest pain 08/04/2013  . Memory impairment 08/04/2013  . Orthostasis 02/26/2013  . Tachycardia 02/24/2013  . Coronary artery calcification seen on CAT scan 01/01/2013  . Tendonitis, calcific, shoulder 01/01/2013  . OSA (obstructive sleep apnea) 01/01/2013  . PD (Parkinson's disease) ([place]) 11/26/2011  . Hematochezia 11/09/2011  . Colon polyps 11/09/2011  . IBS (irritable bowel syndrome) 11/09/2011  . Anosmia 11/06/2010  . Impaired glucose tolerance 11/04/2010  . Hearing loss 05/07/2010  . Preventative health care 05/07/2010  . BUNION, RIGHT FOOT 09/18/2009  . GERD 05/24/2008  . OSTEOPOROSIS 05/24/2008  . Hyperlipidemia 09/23/2006  . Anxiety state 09/23/2006  . Depression 09/23/2006  . CARPAL TUNNEL SYNDROME, BILATERAL 09/23/2006  . Essential hypertension 09/23/2006  . DIVERTICULOSIS, COLON 09/23/2006    Past Surgical History:  Procedure Laterality Date  . CARDIAC CATHETERIZATION    . CATARACT EXTRACTION    . COLONOSCOPY  2008  . COLONOSCOPY W/ BIOPSIES  2008   Dr. Collene Mares -negative random bxs  . ESOPHAGOGASTRODUODENOSCOPY  2008   Dr. Harriette Bouillon duodenal ulcer  . FOOT SURGERY Bilateral 2015  . LEFT AND RIGHT HEART CATHETERIZATION WITH CORONARY ANGIOGRAM N/A 11/09/2013   Procedure: LEFT AND RIGHT HEART CATHETERIZATION WITH CORONARY ANGIOGRAM;  Surgeon: Peter M Martinique, MD;  Location: [facility] CATH LAB;  Service: Cardiovascular;  Laterality: N/A;  . nasal septum repair    . s/p bilat CTS    . s/p fibroid ablation    . s/p ganglion cyst right wrist    .  s/p right thumb trigger finger surgury    . SHOULDER SURGERY Right 2015  . TUBAL LIGATION    . UPPER GASTROINTESTINAL ENDOSCOPY       OB History    Gravida  3   Para  2   Term  2   Preterm      AB  1   Living  2     SAB      IAB  1   Ectopic      Multiple      Live Births  2           Family History   Problem Relation Age of Onset  . Peripheral vascular disease Mother        with bilat amputations  . Heart attack Mother   . Hypertension Father   . Heart disease Father 34  . Hypertension Brother   . Heart attack Brother   . Hypertension Brother   . Hypertension Brother   . Colon cancer Neg Hx   . Colon polyps Neg Hx   . Esophageal cancer Neg Hx   . Rectal cancer Neg Hx   . Stomach cancer Neg Hx     Social History   Tobacco Use  . Smoking status: Passive Smoke Exposure - Never Smoker  . Smokeless tobacco: Never Used  Vaping Use  . Vaping Use: Never used  Substance Use Topics  . Alcohol use: Not Currently    Comment: Occ  . Drug use: No    Home Medications Prior to Admission medications   Medication Sig Start Date End Date Taking? Authorizing Provider  acetaminophen (TYLENOL) 500 MG tablet Take 1,000 mg by mouth every 6 (six) hours as needed for mild pain.    [provider]  Amantadine HCl 100 MG tablet Take 1 tablet (100 mg total) by mouth daily. 02/11/19   Nita Sells, MD  amLODipine (NORVASC) 10 MG tablet Take 1 tablet (10 mg total) by mouth daily. 02/12/19   Nita Sells, MD  aspirin 81 MG tablet Take 81 mg by mouth daily.    [provider]  carbidopa-levodopa (SINEMET IR) 25-100 MG tablet TAKE ONE TABLET BY MOUTH THREE TIMES DAILY Patient taking differently: Take 1 tablet by mouth See admin instructions. Take 1 tablet by mouth four times a day- 8 AM, 12 PM, and between 5-6 PM 10/26/15   Tat, Rebecca S, DO  Carbidopa-Levodopa ER (SINEMET CR) 25-100 MG tablet controlled release Take 1 tablet by mouth 2 (two) times daily. 02/26/19   Regalado, Belkys A, MD  dicyclomine (BENTYL) 20 MG tablet Take 20 mg by mouth 4 (four) times daily as needed for spasms.    [provider]  diphenhydrAMINE (BENADRYL) 25 MG tablet Take 25 mg by mouth at bedtime as needed for sleep.    [provider]  diphenhydramine-acetaminophen (TYLENOL  PM) 25-500 MG TABS tablet Take 1 tablet by mouth at bedtime as needed.    [provider]  gabapentin (NEURONTIN) 100 MG capsule Take 100 mg by mouth 3 (three) times daily. Can take 200mg  tid as needed    [provider]  mycophenolate (CELLCEPT) 500 MG tablet Take 1 tablet (500 mg total) by mouth 2 (two) times daily. 02/22/20   Margaretha Seeds, MD  potassium chloride SA (KLOR-CON) 20 MEQ tablet Take 1 tablet (20 mEq total) by mouth daily. 02/26/19   Regalado, Belkys A, MD  rOPINIRole (REQUIP) 1 MG tablet Take 1 tablet (1 mg total) by  mouth 3 (three) times daily. Patient taking differently: Take 1 mg by mouth See admin instructions. Take 1 mg by mouth three times a day- 8 AM, 12 PM, and between 5-6 PM 02/11/19   Nita Sells, MD  rosuvastatin (CRESTOR) 5 MG tablet Take 5 mg by mouth every evening.  01/10/19   [provider]  senna (SENOKOT) 8.6 MG TABS tablet Take 1 tablet (8.6 mg total) by mouth 2 (two) times daily. 02/26/19   Regalado, Belkys A, MD  sertraline (ZOLOFT) 100 MG tablet Take 100 mg by mouth daily. 01/10/19   [provider]  sulfamethoxazole-trimethoprim (BACTRIM) 400-80 MG tablet Take 1 tablet by mouth 2 (two) times daily. 02/22/20   Margaretha Seeds, MD    Allergies    Myrbetriq [mirabegron], Atorvastatin, and Zocor [simvastatin]  Review of Systems   Review of Systems Ten systems reviewed and are negative for acute change, except as noted in the HPI.   Physical Exam Updated Vital Signs BP 104/85   Pulse 97   Temp (!) 97.5 F (36.4 C) (Oral)   Resp (!) 22   SpO2 100%   Physical Exam Vitals and nursing note reviewed.  Constitutional:      General: She is not in acute distress.    Appearance: She is well-developed and well-nourished. She is not diaphoretic.  HENT:     Head: Normocephalic and atraumatic.  Eyes:     General: No scleral icterus.    Extraocular Movements: Extraocular movements intact.     Conjunctiva/sclera:  Conjunctivae normal.     Pupils: Pupils are equal, round, and reactive to light.  Cardiovascular:     Rate and Rhythm: Normal rate and regular rhythm.     Heart sounds: Normal heart sounds. No murmur heard. No friction rub. No gallop.   Pulmonary:     Effort: Pulmonary effort is normal. No respiratory distress.     Breath sounds: Normal breath sounds.  Abdominal:     General: Bowel sounds are normal. There is no distension.     Palpations: Abdomen is soft. There is no mass.     Tenderness: There is no abdominal tenderness. There is no guarding.  Musculoskeletal:     Cervical back: Normal range of motion.  Skin:    General: Skin is warm and dry.  Neurological:     General: No focal deficit present.     Mental Status: She is alert and oriented to person, place, and time. She is confused.     GCS: GCS eye subscore is 4. GCS verbal subscore is 5. GCS motor subscore is 6.     Cranial Nerves: No cranial nerve deficit, dysarthria or facial asymmetry.     Sensory: No sensory deficit (in the feet).     Motor: Tremor (Tremor is worse with activity , questionable myoclonus at the ankles) present. No weakness, abnormal muscle tone or pronator drift.     Coordination: Finger-Nose-Finger Test and Heel to [facility] Test normal.     Deep Tendon Reflexes:     Reflex Scores:      Patellar reflexes are 3+ on the right side and 3+ on the left side. Psychiatric:        Behavior: Behavior normal.     ED Results / Procedures / Treatments   Labs (all labs ordered are listed, but only abnormal results are displayed) Labs Reviewed  BASIC METABOLIC PANEL - Abnormal; Notable for the following components:      Result Value  CO2 19 (*)    Glucose, Bld 110 (*)    BUN 28 (*)    Creatinine, Ser 1.49 (*)    GFR, Estimated 37 (*)    All other components within normal limits  CBC  ETHANOL  RAPID URINE DRUG SCREEN, HOSP PERFORMED  URINALYSIS, ROUTINE W REFLEX MICROSCOPIC  HEPATIC FUNCTION PANEL  AMMONIA   TROPONIN I (HIGH SENSITIVITY)  TROPONIN I (HIGH SENSITIVITY)    EKG None  Radiology DG Chest 2 View  Result Date: 03/30/2020 CLINICAL DATA:  Chest pain EXAM: CHEST - 2 VIEW COMPARISON:  Jun 22, 2019 FINDINGS: The heart size is mildly enlarged. Aortic calcifications are noted. There is no pneumothorax. No large pleural effusion. No acute osseous abnormality. IMPRESSION: Mild cardiomegaly. No acute cardiopulmonary process. Electronically Signed   By: Constance Holster M.D.   On: 03/30/2020 00:18    Procedures Procedures  Medications Ordered in ED Medications - No data to display  ED Course  I have reviewed the triage vital signs and the nursing notes.  Pertinent labs & imaging results that were available during my care of the patient were reviewed by me and considered in my medical decision making (see chart for details).    MDM Rules/Calculators/A&P                          *** Final Clinical Impression(s) / ED Diagnoses Final diagnoses:  None    Rx / DC Orders ED Discharge Orders    None

## 2020-03-30 NOTE — ED Notes (Signed)
Provider at bedside

## 2020-03-30 NOTE — ED Provider Notes (Incomplete)
[facility] EMERGENCY DEPARTMENT Provider Note   CSN: [id number] Arrival date & time: 03/29/20  2333     History Chief Complaint  Patient presents with  . Weakness    Destiny Roth is a 70 y.o. female.  HPI     Past Medical History:  Diagnosis Date  . Abdominal pain, epigastric 09/18/2009  . Anosmia 11/06/2010  . ANXIETY 09/23/2006  . BUNION, RIGHT FOOT 09/18/2009  . CARPAL TUNNEL SYNDROME, BILATERAL 09/23/2006  . DEPRESSION 09/23/2006  . DIVERTICULOSIS, COLON 09/23/2006  . GERD 05/24/2008  . GLUCOSE INTOLERANCE 05/24/2008  . Hemorrhoids   . Hypercalcemia   . HYPERLIPIDEMIA 09/23/2006  . HYPERTENSION 09/23/2006  . IBS 09/23/2006  . Impaired glucose tolerance 11/04/2010  . OSTEOPOROSIS 05/24/2008   06/01/19-pt states osteopenia  . Parkinson's disease ([place])   . Restrictive lung disease 12/20/2013  . Sarcoidosis 02/26/2019    Patient Active Problem List   Diagnosis Date Noted  . Therapeutic drug monitoring 05/26/2019  . Sarcoidosis 04/27/2019  . Visual disturbance 04/27/2019  . 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 Generalized weakness 02/04/2019  . Vertigo 03/12/2016  . Peripheral edema 07/09/2014  . DOE (dyspnea on exertion) 11/18/2013  . Hot flashes 11/18/2013  . Chest pain 08/04/2013  . Memory impairment 08/04/2013  . Orthostasis 02/26/2013  . Tachycardia 02/24/2013  . Coronary artery calcification seen on CAT scan 01/01/2013  . Tendonitis, calcific, shoulder 01/01/2013  . OSA (obstructive sleep apnea) 01/01/2013  . PD (Parkinson's disease) ([place]) 11/26/2011  . Hematochezia 11/09/2011  . Colon polyps 11/09/2011  . IBS (irritable bowel syndrome) 11/09/2011  . Anosmia 11/06/2010  . Impaired glucose tolerance 11/04/2010  . Hearing loss 05/07/2010  . Preventative health care 05/07/2010  . BUNION, RIGHT FOOT 09/18/2009  . GERD 05/24/2008  . OSTEOPOROSIS 05/24/2008  . Hyperlipidemia 09/23/2006  . Anxiety state 09/23/2006  .  Depression 09/23/2006  . CARPAL TUNNEL SYNDROME, BILATERAL 09/23/2006  . Essential hypertension 09/23/2006  . DIVERTICULOSIS, COLON 09/23/2006    Past Surgical History:  Procedure Laterality Date  . CARDIAC CATHETERIZATION    . CATARACT EXTRACTION    . COLONOSCOPY  2008  . COLONOSCOPY W/ BIOPSIES  2008   Dr. Collene Mares -negative random bxs  . ESOPHAGOGASTRODUODENOSCOPY  2008   Dr. Harriette Bouillon duodenal ulcer  . FOOT SURGERY Bilateral 2015  . LEFT AND RIGHT HEART CATHETERIZATION WITH CORONARY ANGIOGRAM N/A 11/09/2013   Procedure: LEFT AND RIGHT HEART CATHETERIZATION WITH CORONARY ANGIOGRAM;  Surgeon: Peter M Martinique, MD;  Location: [facility] CATH LAB;  Service: Cardiovascular;  Laterality: N/A;  . nasal septum repair    . s/p bilat CTS    . s/p fibroid ablation    . s/p ganglion cyst right wrist    . s/p right thumb trigger finger surgury    . SHOULDER SURGERY Right 2015  . TUBAL LIGATION    . UPPER GASTROINTESTINAL ENDOSCOPY       OB History    Gravida  3   Para  2   Term  2   Preterm      AB  1   Living  2     SAB      IAB  1   Ectopic      Multiple      Live Births  2           Family History  Problem Relation Age of Onset  . Peripheral vascular disease Mother        with  bilat amputations  . Heart attack Mother   . Hypertension Father   . Heart disease Father 32  . Hypertension Brother   . Heart attack Brother   . Hypertension Brother   . Hypertension Brother   . Colon cancer Neg Hx   . Colon polyps Neg Hx   . Esophageal cancer Neg Hx   . Rectal cancer Neg Hx   . Stomach cancer Neg Hx     Social History   Tobacco Use  . Smoking status: Passive Smoke Exposure - Never Smoker  . Smokeless tobacco: Never Used  Vaping Use  . Vaping Use: Never used  Substance Use Topics  . Alcohol use: Not Currently    Comment: Occ  . Drug use: No    Home Medications Prior to Admission medications   Medication Sig Start Date End Date Taking? Authorizing Provider   acetaminophen (TYLENOL) 500 MG tablet Take 1,000 mg by mouth every 6 (six) hours as needed for mild pain.    [provider]  Amantadine HCl 100 MG tablet Take 1 tablet (100 mg total) by mouth daily. 02/11/19   Nita Sells, MD  amLODipine (NORVASC) 10 MG tablet Take 1 tablet (10 mg total) by mouth daily. 02/12/19   Nita Sells, MD  aspirin 81 MG tablet Take 81 mg by mouth daily.    [provider]  carbidopa-levodopa (SINEMET IR) 25-100 MG tablet TAKE ONE TABLET BY MOUTH THREE TIMES DAILY Patient taking differently: Take 1 tablet by mouth See admin instructions. Take 1 tablet by mouth four times a day- 8 AM, 12 PM, and between 5-6 PM 10/26/15   Tat, Rebecca S, DO  Carbidopa-Levodopa ER (SINEMET CR) 25-100 MG tablet controlled release Take 1 tablet by mouth 2 (two) times daily. 02/26/19   Regalado, Belkys A, MD  dicyclomine (BENTYL) 20 MG tablet Take 20 mg by mouth 4 (four) times daily as needed for spasms.    [provider]  diphenhydrAMINE (BENADRYL) 25 MG tablet Take 25 mg by mouth at bedtime as needed for sleep.    [provider]  diphenhydramine-acetaminophen (TYLENOL PM) 25-500 MG TABS tablet Take 1 tablet by mouth at bedtime as needed.    [provider]  gabapentin (NEURONTIN) 100 MG capsule Take 100 mg by mouth 3 (three) times daily. Can take 200mg  tid as needed    [provider]  mycophenolate (CELLCEPT) 500 MG tablet Take 1 tablet (500 mg total) by mouth 2 (two) times daily. 02/22/20   Margaretha Seeds, MD  potassium chloride SA (KLOR-CON) 20 MEQ tablet Take 1 tablet (20 mEq total) by mouth daily. 02/26/19   Regalado, Belkys A, MD  rOPINIRole (REQUIP) 1 MG tablet Take 1 tablet (1 mg total) by mouth 3 (three) times daily. Patient taking differently: Take 1 mg by mouth See admin instructions. Take 1 mg by mouth three times a day- 8 AM, 12 PM, and between 5-6 PM 02/11/19   Nita Sells, MD  rosuvastatin (CRESTOR) 5  MG tablet Take 5 mg by mouth every evening.  01/10/19   [provider]  senna (SENOKOT) 8.6 MG TABS tablet Take 1 tablet (8.6 mg total) by mouth 2 (two) times daily. 02/26/19   Regalado, Belkys A, MD  sertraline (ZOLOFT) 100 MG tablet Take 100 mg by mouth daily. 01/10/19   [provider]  sulfamethoxazole-trimethoprim (BACTRIM) 400-80 MG tablet Take 1 tablet by mouth 2 (two) times daily. 02/22/20   Margaretha Seeds, MD  Allergies    Myrbetriq [mirabegron], Atorvastatin, and Zocor [simvastatin]  Review of Systems   Review of Systems  Physical Exam Updated Vital Signs BP 104/85   Pulse 97   Temp (!) 97.5 F (36.4 C) (Oral)   Resp (!) 22   SpO2 100%   Physical Exam  ED Results / Procedures / Treatments   Labs (all labs ordered are listed, but only abnormal results are displayed) Labs Reviewed  BASIC METABOLIC PANEL - Abnormal; Notable for the following components:      Result Value   CO2 19 (*)    Glucose, Bld 110 (*)    BUN 28 (*)    Creatinine, Ser 1.49 (*)    GFR, Estimated 37 (*)    All other components within normal limits  CBC  ETHANOL  RAPID URINE DRUG SCREEN, HOSP PERFORMED  TROPONIN I (HIGH SENSITIVITY)  TROPONIN I (HIGH SENSITIVITY)    EKG None  Radiology DG Chest 2 View  Result Date: 03/30/2020 CLINICAL DATA:  Chest pain EXAM: CHEST - 2 VIEW COMPARISON:  Jun 22, 2019 FINDINGS: The heart size is mildly enlarged. Aortic calcifications are noted. There is no pneumothorax. No large pleural effusion. No acute osseous abnormality. IMPRESSION: Mild cardiomegaly. No acute cardiopulmonary process. Electronically Signed   By: Constance Holster M.D.   On: 03/30/2020 00:18    Procedures Procedures {Remember to document critical care time when appropriate:1}  Medications Ordered in ED Medications - No data to display  ED Course  I have reviewed the triage vital signs and the nursing notes.  Pertinent labs & imaging results that were  available during my care of the patient were reviewed by me and considered in my medical decision making (see chart for details).    MDM Rules/Calculators/A&P                          *** Final Clinical Impression(s) / ED Diagnoses Final diagnoses:  None    Rx / DC Orders ED Discharge Orders    None

## 2020-03-30 NOTE — ED Notes (Addendum)
Pts husband to give patient morning medications per [place], [place]. Pharmacy to send tech for medication reconciliation.

## 2020-03-30 NOTE — Discharge Instructions (Signed)
  Get help right away if: The person has thoughts of harming self or harming others. You have severe pain in your back or your lower abdomen. You have a fever or chills. You have nausea or vomiting. There are serious side effects of medicine, such as: Swelling of the face, lips, tongue, or throat. Fever, confusion, muscle spasms, or seizures.

## 2020-03-31 NOTE — ED Provider Notes (Signed)
[facility] EMERGENCY DEPARTMENT Provider Note   CSN: [id number] Arrival date & time: 03/29/20  2333     History Chief Complaint  Patient presents with  . Weakness    [place] is a 70 y.o. female with a pmh of Sarcoidosis and  Parkinson's disease who presents with weakness and hallucinations. The patient is followed by OP Neurology. She reports that she was hospitalized with similar sxs in the past  And was found to by hypercalcemic (x2). She reports worsening balance. She has been worked up by her Neurologist at [facility] and had MRI C/L spine that showed degenerative changes and L4/5 anterolisthesis. EMG showed minimally abnormal study. She reports that she has had increase in hallucinations. Sometimes she is aware, but other times her husband has to tell her what she is seeing is not real. She has been seeing people and animals that are not presents. He has multiple chronic neurologic abnormalities that are not significantly different.  She denies fever, headaches, urinary sxs.   HPI     Past Medical History:  Diagnosis Date  . Abdominal pain, epigastric 09/18/2009  . Anosmia 11/06/2010  . ANXIETY 09/23/2006  . BUNION, RIGHT FOOT 09/18/2009  . CARPAL TUNNEL SYNDROME, BILATERAL 09/23/2006  . DEPRESSION 09/23/2006  . DIVERTICULOSIS, COLON 09/23/2006  . GERD 05/24/2008  . GLUCOSE INTOLERANCE 05/24/2008  . Hemorrhoids   . Hypercalcemia   . HYPERLIPIDEMIA 09/23/2006  . HYPERTENSION 09/23/2006  . IBS 09/23/2006  . Impaired glucose tolerance 11/04/2010  . OSTEOPOROSIS 05/24/2008   06/01/19-pt states osteopenia  . Parkinson's disease ([place])   . Restrictive lung disease 12/20/2013  . Sarcoidosis 02/26/2019    Patient Active Problem List   Diagnosis Date Noted  . Therapeutic drug monitoring 05/26/2019  . Sarcoidosis 04/27/2019  . Visual disturbance 04/27/2019  . 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 Generalized weakness 02/04/2019  . Vertigo 03/12/2016  .  Peripheral edema 07/09/2014  . DOE (dyspnea on exertion) 11/18/2013  . Hot flashes 11/18/2013  . Chest pain 08/04/2013  . Memory impairment 08/04/2013  . Orthostasis 02/26/2013  . Tachycardia 02/24/2013  . Coronary artery calcification seen on CAT scan 01/01/2013  . Tendonitis, calcific, shoulder 01/01/2013  . OSA (obstructive sleep apnea) 01/01/2013  . PD (Parkinson's disease) ([place]) 11/26/2011  . Hematochezia 11/09/2011  . Colon polyps 11/09/2011  . IBS (irritable bowel syndrome) 11/09/2011  . Anosmia 11/06/2010  . Impaired glucose tolerance 11/04/2010  . Hearing loss 05/07/2010  . Preventative health care 05/07/2010  . BUNION, RIGHT FOOT 09/18/2009  . GERD 05/24/2008  . OSTEOPOROSIS 05/24/2008  . Hyperlipidemia 09/23/2006  . Anxiety state 09/23/2006  . Depression 09/23/2006  . CARPAL TUNNEL SYNDROME, BILATERAL 09/23/2006  . Essential hypertension 09/23/2006  . DIVERTICULOSIS, COLON 09/23/2006    Past Surgical History:  Procedure Laterality Date  . CARDIAC CATHETERIZATION    . CATARACT EXTRACTION    . COLONOSCOPY  2008  . COLONOSCOPY W/ BIOPSIES  2008   Dr. Collene Mares -negative random bxs  . ESOPHAGOGASTRODUODENOSCOPY  2008   Dr. Harriette Bouillon duodenal ulcer  . FOOT SURGERY Bilateral 2015  . LEFT AND RIGHT HEART CATHETERIZATION WITH CORONARY ANGIOGRAM N/A 11/09/2013   Procedure: LEFT AND RIGHT HEART CATHETERIZATION WITH CORONARY ANGIOGRAM;  Surgeon: Peter M Martinique, MD;  Location: [facility] CATH LAB;  Service: Cardiovascular;  Laterality: N/A;  . nasal septum repair    . s/p bilat CTS    . s/p fibroid ablation    . s/p ganglion cyst right wrist    .  s/p right thumb trigger finger surgury    . SHOULDER SURGERY Right 2015  . TUBAL LIGATION    . UPPER GASTROINTESTINAL ENDOSCOPY       OB History    Gravida  3   Para  2   Term  2   Preterm      AB  1   Living  2     SAB      IAB  1   Ectopic      Multiple      Live Births  2           Family History   Problem Relation Age of Onset  . Peripheral vascular disease Mother        with bilat amputations  . Heart attack Mother   . Hypertension Father   . Heart disease Father 79  . Hypertension Brother   . Heart attack Brother   . Hypertension Brother   . Hypertension Brother   . Colon cancer Neg Hx   . Colon polyps Neg Hx   . Esophageal cancer Neg Hx   . Rectal cancer Neg Hx   . Stomach cancer Neg Hx     Social History   Tobacco Use  . Smoking status: Passive Smoke Exposure - Never Smoker  . Smokeless tobacco: Never Used  Vaping Use  . Vaping Use: Never used  Substance Use Topics  . Alcohol use: Not Currently    Comment: Occ  . Drug use: No    Home Medications Prior to Admission medications   Medication Sig Start Date End Date Taking? Authorizing Provider  cephALEXin (KEFLEX) 500 MG capsule Take 1 capsule (500 mg total) by mouth 2 (two) times daily for 7 days. 03/30/20 04/06/20 Yes Linkyn Gobin, PA-C  acetaminophen (TYLENOL) 500 MG tablet Take 1,000 mg by mouth every 6 (six) hours as needed for mild pain.    [provider]  Amantadine HCl 100 MG tablet Take 1 tablet (100 mg total) by mouth daily. 02/11/19   Nita Sells, MD  amLODipine (NORVASC) 10 MG tablet Take 1 tablet (10 mg total) by mouth daily. 02/12/19   Nita Sells, MD  aspirin 81 MG tablet Take 81 mg by mouth daily.    [provider]  carbidopa-levodopa (SINEMET IR) 25-100 MG tablet TAKE ONE TABLET BY MOUTH THREE TIMES DAILY Patient taking differently: Take 1 tablet by mouth See admin instructions. Take 1 tablet by mouth four times a day- 8 AM, 12 PM, and between 5-6 PM 10/26/15   Tat, Rebecca S, DO  Carbidopa-Levodopa ER (SINEMET CR) 25-100 MG tablet controlled release Take 1 tablet by mouth 2 (two) times daily. 02/26/19   Regalado, Belkys A, MD  dicyclomine (BENTYL) 20 MG tablet Take 20 mg by mouth 4 (four) times daily as needed for spasms.    [provider]   diphenhydrAMINE (BENADRYL) 25 MG tablet Take 25 mg by mouth at bedtime as needed for sleep.    [provider]  diphenhydramine-acetaminophen (TYLENOL PM) 25-500 MG TABS tablet Take 1 tablet by mouth at bedtime as needed.    [provider]  gabapentin (NEURONTIN) 100 MG capsule Take 100 mg by mouth 3 (three) times daily. Can take 200mg  tid as needed    [provider]  mycophenolate (CELLCEPT) 500 MG tablet Take 1 tablet (500 mg total) by mouth 2 (two) times daily. 02/22/20   Margaretha Seeds, MD  potassium chloride SA (KLOR-CON) 20 MEQ tablet Take 1  tablet (20 mEq total) by mouth daily. 02/26/19   Regalado, Belkys A, MD  rOPINIRole (REQUIP) 1 MG tablet Take 1 tablet (1 mg total) by mouth 3 (three) times daily. Patient taking differently: Take 1 mg by mouth See admin instructions. Take 1 mg by mouth three times a day- 8 AM, 12 PM, and between 5-6 PM 02/11/19   Nita Sells, MD  rosuvastatin (CRESTOR) 5 MG tablet Take 5 mg by mouth every evening.  01/10/19   [provider]  senna (SENOKOT) 8.6 MG TABS tablet Take 1 tablet (8.6 mg total) by mouth 2 (two) times daily. 02/26/19   Regalado, Belkys A, MD  sertraline (ZOLOFT) 100 MG tablet Take 100 mg by mouth daily. 01/10/19   [provider]  sulfamethoxazole-trimethoprim (BACTRIM) 400-80 MG tablet Take 1 tablet by mouth 2 (two) times daily. 02/22/20   Margaretha Seeds, MD    Allergies    Myrbetriq [mirabegron], Atorvastatin, and Zocor [simvastatin]  Review of Systems   Review of Systems Ten systems reviewed and are negative for acute change, except as noted in the HPI.   Physical Exam Updated Vital Signs BP 128/71   Pulse 82   Temp 98.1 F (36.7 C) (Oral)   Resp 17   SpO2 100%   Physical Exam Vitals and nursing note reviewed.  Constitutional:      General: She is not in acute distress.    Appearance: She is well-developed and well-nourished. She is not diaphoretic.  HENT:     Head:  Normocephalic and atraumatic.  Eyes:     General: No scleral icterus.    Extraocular Movements: Extraocular movements intact.     Conjunctiva/sclera: Conjunctivae normal.     Pupils: Pupils are equal, round, and reactive to light.  Cardiovascular:     Rate and Rhythm: Normal rate and regular rhythm.     Heart sounds: Normal heart sounds. No murmur heard. No friction rub. No gallop.   Pulmonary:     Effort: Pulmonary effort is normal. No respiratory distress.     Breath sounds: Normal breath sounds.  Abdominal:     General: Bowel sounds are normal. There is no distension.     Palpations: Abdomen is soft. There is no mass.     Tenderness: There is no abdominal tenderness. There is no guarding.  Musculoskeletal:     Cervical back: Normal range of motion.  Skin:    General: Skin is warm and dry.  Neurological:     General: No focal deficit present.     Mental Status: She is alert and oriented to person, place, and time. She is confused.     GCS: GCS eye subscore is 4. GCS verbal subscore is 5. GCS motor subscore is 6.     Cranial Nerves: No cranial nerve deficit, dysarthria or facial asymmetry.     Sensory: No sensory deficit (in the feet).     Motor: Tremor (Tremor is worse with activity , questionable myoclonus at the ankles) present. No weakness, abnormal muscle tone or pronator drift.     Coordination: Finger-Nose-Finger Test and Heel to [facility] Test normal.     Deep Tendon Reflexes:     Reflex Scores:      Patellar reflexes are 3+ on the right side and 3+ on the left side. Psychiatric:        Behavior: Behavior normal.     ED Results / Procedures / Treatments   Labs (all labs ordered are listed, but only abnormal  results are displayed) Labs Reviewed  URINE CULTURE - Abnormal; Notable for the following components:      Result Value   Culture   (*)        All other components within normal limits  BASIC METABOLIC PANEL - Abnormal; Notable for the following components:   CO2  19 (*)    Glucose, Bld 110 (*)    BUN 28 (*)    Creatinine, Ser 1.49 (*)    GFR, Estimated 37 (*)    All other components within normal limits  URINALYSIS, ROUTINE W REFLEX MICROSCOPIC - Abnormal; Notable for the following components:   APPearance HAZY (*)    Nitrite POSITIVE (*)    Leukocytes,Ua SMALL (*)    Bacteria, UA FEW (*)    All other components within normal limits  CBC  ETHANOL  RAPID URINE DRUG SCREEN, HOSP PERFORMED  HEPATIC FUNCTION PANEL  AMMONIA  TROPONIN I (HIGH SENSITIVITY)  TROPONIN I (HIGH SENSITIVITY)    EKG EKG Interpretation  Date/Time:  Wednesday March 29 2020 23:47:06 EST Ventricular Rate:  89 PR Interval:  152 QRS Duration: 82 QT Interval:  348 QTC Calculation: 423 R Axis:   12 Text Interpretation: Normal sinus rhythm Nonspecific T wave abnormality Abnormal ECG No significant change since last tracing Confirmed by Lacretia Leigh ([id number]) on 03/30/2020 8:54:06 PM   Radiology DG Chest 2 View  Result Date: 03/30/2020 CLINICAL DATA:  Chest pain EXAM: CHEST - 2 VIEW COMPARISON:  Jun 22, 2019 FINDINGS: The heart size is mildly enlarged. Aortic calcifications are noted. There is no pneumothorax. No large pleural effusion. No acute osseous abnormality. IMPRESSION: Mild cardiomegaly. No acute cardiopulmonary process. Electronically Signed   By: Constance Holster M.D.   On: 03/30/2020 00:18    Procedures Procedures  Medications Ordered in ED Medications  cefTRIAXone (ROCEPHIN) 1 g in sodium chloride 0.9 % 100 mL IVPB (0 g Intravenous Stopped 03/30/20 1141)    ED Course  I have reviewed the triage vital signs and the nursing notes.  Pertinent labs & imaging results that were available during my care of the patient were reviewed by me and considered in my medical decision making (see chart for details).    MDM Rules/Calculators/A&P                          [id number] and weakness VS:  Vitals:   03/30/20 0945 03/30/20 1100 03/30/20 1200  03/30/20 1239  BP: 96/72 124/71 128/71   Pulse: 84 83 82   Resp: 18 16 17    Temp:    98.1 F (36.7 C)  TempSrc:    Oral  SpO2: 100% 100% 100%     SP:[id number] is gathered by patient/ family and emr. Previous records obtained and reviewed. DDX:The patient's complaint of weakness involves an extensive number of diagnostic and treatment options, and is a complaint that carries with it a high risk of complications, morbidity, and potential mortality. Given the large differential diagnosis, medical decision making is of high complexity. The differential diagnosis of weakness includes but is not limited to neurologic causes (GBS, myasthenia gravis, CVA, MS, ALS, transverse myelitis, spinal cord injury, CVA, botulism, ) and other causes: ACS, Arrhythmia, syncope, orthostatic hypotension, sepsis, hypoglycemia, electrolyte disturbance, hypothyroidism, respiratory failure, symptomatic anemia, dehydration, heat injury, polypharmacy, malignancy. Labs: I ordered reviewed and interpreted labs which include CBC which shows no abnormalities, BMP with slightly elevated creatinine at 1.49, UDS ammonia hepatic function panel troponin and  ethanol levels within normal limits.  Urinalysis appears to be positive for infection with positive nitrites leukocytes and bacteria.  No apparent contamination with epithelial cells. Imaging: I ordered and reviewed images which included 2 view chest x-ray. I independently visualized and interpreted all imaging. There are no acute, significant findings on today's images. EKG: Sinus rhythm at a rate of 89 Consults: Case discussed with Dr. Leonel Ramsay on-call for neurology who feels that most likely this is related to her Parkinson's disease and we should rule out metabolic cause of acute mental status change. I also consulted with her personal neurologist at [facility] Dr. Mervyn Skeeters.  We reviewed labs and work-up including the fact that patient has a urinary tract infection. MDM:  Patient here with hallucinations, she has multiple medications which could cause this including amantadine and carbidopa-levodopa.  Patient's Parkinson's itself can also cause hallucinations.  Dr. Mervyn Skeeters and I are in agreement that patient very likely needs to have urinary tract infection treated first as this may have brought about her mental status change and hallucinations.  Patient given Rocephin here will be treated with Keflex.  I discussed the findings with the patient and her husband by phone.  The plan is to currently treat urinary tract infection and look for decrease in her hallucinations however should she continue to have these she would be very close follow-up with Dr. Mervyn Skeeters who was aware of her case and will reach out to the patient.  She is otherwise afebrile without leukocytosis or fever and has close neurologic follow-up.  Patient will be discharged at this time.  Discussed return precautions. Patient disposition:The patient appears reasonably screened and/or stabilized for discharge and I doubt any other medical condition or other [facility] requiring further screening, evaluation, or treatment in the ED at this time prior to discharge. I have discussed lab and/or imaging findings with the patient and answered all questions/concerns to the best of my ability.I have discussed return precautions and OP follow up.    Final Clinical Impression(s) / ED Diagnoses Final diagnoses:  Acute cystitis without hematuria  Hallucinosis ([place])    Rx / DC Orders ED Discharge Orders         Ordered    cephALEXin (KEFLEX) 500 MG capsule  2 times daily        03/30/20 1232           Margarita Mail, PA-C 03/31/20 1026    Pattricia Boss, MD 03/31/20 1426

## 2020-04-01 LAB — URINE CULTURE: Culture: >=100000 — AB

## 2020-04-10 DIAGNOSIS — N1832 Chronic kidney disease, stage 3b: Secondary | ICD-10-CM | POA: Diagnosis not present

## 2020-04-10 DIAGNOSIS — I129 Hypertensive chronic kidney disease with stage 1 through stage 4 chronic kidney disease, or unspecified chronic kidney disease: Secondary | ICD-10-CM | POA: Diagnosis not present

## 2020-04-10 DIAGNOSIS — N390 Urinary tract infection, site not specified: Secondary | ICD-10-CM | POA: Diagnosis not present

## 2020-04-10 DIAGNOSIS — D869 Sarcoidosis, unspecified: Secondary | ICD-10-CM | POA: Diagnosis not present

## 2020-04-10 DIAGNOSIS — R443 Hallucinations, unspecified: Secondary | ICD-10-CM | POA: Diagnosis not present

## 2020-04-11 DIAGNOSIS — N390 Urinary tract infection, site not specified: Secondary | ICD-10-CM | POA: Diagnosis not present

## 2020-04-17 ENCOUNTER — Other Ambulatory Visit: Payer: Self-pay

## 2020-04-17 ENCOUNTER — Ambulatory Visit (INDEPENDENT_AMBULATORY_CARE_PROVIDER_SITE_OTHER): Payer: Medicare Other | Admitting: Pulmonary Disease

## 2020-04-17 ENCOUNTER — Encounter: Payer: Self-pay | Admitting: Pulmonary Disease

## 2020-04-17 VITALS — BP 114/84 | HR 88 | Temp 97.3°F | Ht <= 58 in | Wt 122.6 lb

## 2020-04-17 DIAGNOSIS — Z5181 Encounter for therapeutic drug level monitoring: Secondary | ICD-10-CM | POA: Diagnosis not present

## 2020-04-17 DIAGNOSIS — D869 Sarcoidosis, unspecified: Secondary | ICD-10-CM | POA: Diagnosis not present

## 2020-04-17 DIAGNOSIS — R079 Chest pain, unspecified: Secondary | ICD-10-CM

## 2020-04-17 DIAGNOSIS — R0602 Shortness of breath: Secondary | ICD-10-CM

## 2020-04-17 MED ORDER — PREDNISONE 10 MG PO TABS: ORAL_TABLET | ORAL | 0 refills | Status: AC

## 2020-04-17 NOTE — Patient Instructions (Addendum)
Sarcoidosis --Start prednisone taper as instructed  Follow-up with NP via telephone visit on 04/26/20 @ 11 AM Follow-up with me in-person on 05/18/20 @ 11:45 AM

## 2020-04-17 NOTE — Progress Notes (Signed)
Subjective:   PATIENT ID: Destiny Roth: female DOB: 07/18/1950, MRN: [id number]   HPI  Chief Complaint  Patient presents with  . Follow-up    2 mo f/u. States she has not been feeling like herself since last visit. She has some increased chest pressure in the middle of her chest. Denies any increased coughing. Has also noticed an increase in lower back pain.     Reason for Visit: Hospital follow-up  Destiny Roth is a 70 year old female with Parkinson's Roth, HTN, HLD and restrictive lung Roth who presents for follow-up for sarcoid management.   Synopsis: Initially seen as an inpatient Pulmonary consult by me on 02/23/19 for hypercalcemia and consideration of bronchoscopy to rule out sarcoid. However due to lack of pulmonary symptoms and normal CT at the time, bronchoscopy thought to be low-yield. Bone biopsy was performed and demonstrated granulomas Roth without necrosis. She was started on steroids and discharged with Endocrine and Pulmonary follow-up. Of note, she was previously seen in our Pulmonary clinic in 2018 for dyspnea and mild sleep apnea. Her prior PFTs demonstrate mild restrictive defect with normal DLCO which patient stated was attributed to her Parkinson's Roth.  09/23/19 Since our last visit, she has established care with multiple providers including Nephrology and Ophthalmology, the latter who is managing her immunosuppressants. She is currently on tacrolimus 1 mg BID, methotrexate and prednisone. Her prednisone is being weaned. She continues to have fatigue and visual blurriness. Her dyspnea also remains unchanged. Worsens with exertion. Her fatigue seems to be associated with this as well.  11/24/19 She is currently on methotrexate 12.5 mg weekly and on prednisone taper. Tacrolimus was discontinued by ophthalmology due tremors. Overall she is not feeling well with difficulty concentrating, balancing and general weakness. She reports  visuo-spatial/depth perception issues and tinnitis (L>R) which she is going to see a sub-specialist in Neurology. 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 She reports unchanged chest pressure and difficulty with deep breaths. Her breaths have to be shallow. This seems to be occurring more frequently. No wheezing or cough.  02/22/20 She is no longer on methotrexate.  She was started on CellCept in November 2021.  She still continues to have issues with fatigue, tremors and word finding after switching to Cellcept.  She also reports lower extremity aching, no shooting or burning pain. When seen by her neurologist, she reports that her Parkinson's is stable. She was referred to Dr. Harriette Ohara to evaluate for neuro involvement of her sarcoid. She is planned for an MRI of her spine and an EMG to evaluate for neuropathy   04/17/20 Since our last visit, her cellcept was increased. Her ophthalmology note from 03/14/20 by Dr. Manuella Ghazi was reviewed. She reported visual abnormalities however her ocular exam shows stable inflammation so she was advised to continue her current dose of cellcept. She reports worsening shortness of breath and substernal chest pain. Worsens with exertion. Denies wheezing.   Social History: Never smoker  I have personally reviewed patient's past medical/family/social history/allergies/current medications.  Past Medical History:  Diagnosis Date  . Abdominal pain, epigastric 09/18/2009  . Anosmia 11/06/2010  . ANXIETY 09/23/2006  . BUNION, RIGHT FOOT 09/18/2009  . CARPAL TUNNEL SYNDROME, BILATERAL 09/23/2006  . DEPRESSION 09/23/2006  . DIVERTICULOSIS, COLON 09/23/2006  . GERD 05/24/2008  . GLUCOSE INTOLERANCE 05/24/2008  . Hemorrhoids   . Hypercalcemia   .  HYPERLIPIDEMIA 09/23/2006  . HYPERTENSION 09/23/2006  . IBS 09/23/2006  . Impaired glucose tolerance 11/04/2010  .  OSTEOPOROSIS 05/24/2008   06/01/19-pt states osteopenia  . Parkinson's Roth ([place])   . Restrictive lung Roth 12/20/2013  . Sarcoidosis 02/26/2019      Outpatient Medications Prior to Visit  Medication Sig Dispense Refill  . acetaminophen (TYLENOL) 500 MG tablet Take 1,000 mg by mouth every 6 (six) hours as needed for mild pain.    . Amantadine HCl 100 MG tablet Take 1 tablet (100 mg total) by mouth daily.    Marland Kitchen amLODipine (NORVASC) 10 MG tablet Take 1 tablet (10 mg total) by mouth daily. 30 tablet 1  . aspirin 81 MG tablet Take 81 mg by mouth daily.    . carbidopa-levodopa (SINEMET IR) 25-100 MG tablet TAKE ONE TABLET BY MOUTH THREE TIMES DAILY (Patient taking differently: Take 1 tablet by mouth See admin instructions. Take 1 tablet by mouth four times a day- 8 AM, 12 PM, and between 5-6 PM) 270 tablet 1  . Carbidopa-Levodopa ER (SINEMET CR) 25-100 MG tablet controlled release Take 1 tablet by mouth 2 (two) times daily. 60 tablet 0  . dicyclomine (BENTYL) 20 MG tablet Take 20 mg by mouth 4 (four) times daily as needed for spasms.    . diphenhydrAMINE (BENADRYL) 25 MG tablet Take 25 mg by mouth at bedtime as needed for sleep.    . diphenhydramine-acetaminophen (TYLENOL PM) 25-500 MG TABS tablet Take 1 tablet by mouth at bedtime as needed.    . gabapentin (NEURONTIN) 100 MG capsule Take 100 mg by mouth 3 (three) times daily. Can take 238m tid as needed    . mycophenolate (CELLCEPT) 500 MG tablet Take 1 tablet (500 mg total) by mouth 2 (two) times daily. 60 tablet 3  . rOPINIRole (REQUIP) 1 MG tablet Take 1 tablet (1 mg total) by mouth 3 (three) times daily. (Patient taking differently: Take 1 mg by mouth See admin instructions. Take 1 mg by mouth three times a day- 8 AM, 12 PM, and between 5-6 PM) 90 tablet 0  . rosuvastatin (CRESTOR) 5 MG tablet Take 5 mg by mouth every evening.     . senna (SENOKOT) 8.6 MG TABS tablet Take 1 tablet (8.6 mg total) by mouth 2 (two) times daily. 30 tablet 0  .  sertraline (ZOLOFT) 100 MG tablet Take 100 mg by mouth daily.    .Marland Kitchensulfamethoxazole-trimethoprim (BACTRIM) 400-80 MG tablet Take 1 tablet by mouth 2 (two) times daily. 60 tablet 12  . potassium chloride SA (KLOR-CON) 20 MEQ tablet Take 1 tablet (20 mEq total) by mouth daily. 3 tablet 0   No facility-administered medications prior to visit.    Review of Systems  Constitutional: Negative for chills, diaphoresis, fever, malaise/fatigue and weight loss.  HENT: Negative for congestion.   Respiratory: Positive for shortness of breath. Negative for cough, hemoptysis, sputum production and wheezing.   Cardiovascular: Positive for chest pain. Negative for palpitations and leg swelling.     Objective:   Vitals:   04/17/20 1508  BP: 114/84  Pulse: 88  Temp: (!) 97.3 F (36.3 C)  TempSrc: Temporal  SpO2: 98%  Weight: 122 lb 9.6 oz (55.6 kg)  Height: _0  (1.473 m)   SpO2: 98 % (on RA)  Physical Exam: General: Well-appearing, no acute distress HENT: [place], AT Eyes: EOMI, no scleral icterus Respiratory: Clear to auscultation bilaterally.  No crackles, wheezing or rales Cardiovascular: RRR, -M/R/G, no JVD Extremities:-Edema,-tenderness Neuro:  AAO x4, CNII-XII grossly intact  Data Reviewed:  Imaging: CXR 02/23/19 - interval resolutions of bilateral pleural effusions CT Chest 02/09/19 - Bilateral pleural effusions. Mild ground glass opacifications bilaterally. No enlarged mediastinal or hilar adenopathy. PET Myocardial 01/07/20 - No evidence of increased uptake PET/CT 01/07/20 1. Bilateral ground-glass pulmonary opacities demonstrate mild-moderate FDG avidity. Thesefindings are nonspecific and may be infectious or inflammatory.2. Mild FDG activity of non-enlarged bilateral hilar lymph nodes is also nonspecific. This could be reactive in the setting of the pulmonary findings, but activity related to sarcoidcannot be excluded.   PFT: 12/17/13 FVC 2.26 (86%) FEV1 1.88 (93%) Ratio 78  TLC  70% DLCO 75% Interpretation: Mild restrictive defect with mild reduction in gas exchange (uncorrected DLCO)  06/22/19 FVC 2.07 (95%) FEV1 1.75 (107%) Ratio 80  TLC 91% DLCO 63% Interpretation: No obstructive or restrictive defect. Mild reduction in gas exchange   Echocardiogram: 04/22/19 - EF 60-65%. Grade I DD. No WMA or valvular abnormalities.  CBC    Component Value Date/Time   WBC 5.4 03/29/2020 2356   RBC 4.45 03/29/2020 2356   HGB 13.0 03/29/2020 2356   HCT 40.8 03/29/2020 2356   PLT 314 03/29/2020 2356   MCV 91.7 03/29/2020 2356   MCH 29.2 03/29/2020 2356   MCHC 31.9 03/29/2020 2356   RDW 13.2 03/29/2020 2356   LYMPHSABS 1.0 03/29/2020 1235   MONOABS 0.4 03/29/2020 1235   EOSABS 0.1 03/29/2020 1235   BASOSABS 0.0 03/29/2020 1235   BMP Latest Ref Rng & Units 03/29/2020 03/29/2020 02/22/2020  Glucose 70 - 99 mg/dL 110(H) 103(H) 113(H)  BUN 8 - 23 mg/dL 28(H) 30(H) 29(H)  Creatinine 0.44 - 1.00 mg/dL 1.49(H) 1.47(H) 1.25(H)  BUN/Creat Ratio 6 - 22 (calc) - - -  Sodium 135 - 145 mmol/L 139 136 138  Potassium 3.5 - 5.1 mmol/L 4.0 4.3 3.8  Chloride 98 - 111 mmol/L 106 103 102  CO2 22 - 32 mmol/L 19(L) 24 26  Calcium 8.9 - 10.3 mg/dL 10.0 10.3 10.2    Hepatic Function Latest Ref Rng & Units 03/30/2020 03/29/2020 02/22/2020  Total Protein 6.5 - 8.1 g/dL 6.9 7.7 7.5  Albumin 3.5 - 5.0 g/dL 3.8 4.4 4.5  AST 15 - 41 U/L _0 ALT 0 - 44 U/L _1 Alk Phosphatase 38 - 126 U/L 47 60 67  Total Bilirubin 0.3 - 1.2 mg/dL 0.5 0.3 0.3  Bilirubin, Direct 0.0 - 0.2 mg/dL 0.1 0.0 0.1   CKD stage IIIB stable. Electrolytes and LFTs appropriate  EKG - no acute ST changes or TWI. Unchanged prolonged PR interval 140 ms  Imaging, labs and test noted above have been reviewed independently by me.  Assessment & Plan:   Discussion: 70 year old female with Parkinson's Roth and sarcoidosis with endocrine, ocular and renal involvement. Bone biopsy on 02/26/19 (normocellular marrow with  noncaseating granulomatous inflammation). Worsening respiratory symptoms. Will start steroid taper.  Sarcoidosis  --Last PFT in 2015 demonstrated restrictive defect thought to be related to Parkinson's. Recent 05/2019 PFTs show normal PFTs --Echo reviewed from 03/2019 with grade I DD. EKG 03/2020 with unchanged prolonged PR interval   --Sarcoid PET - negative for cardiac involvement. Suggests pulmonary involvement --Immunosuppressants per Optho for now: Continue Cellcept to 500 mg BID --Start prednisone taper as instructed: 40 mg --> Decrease by 10 q3 days. Will schedule telephone visit to assess symptoms and determine if we need to continue steroid dose. Will also ask NP to contact me  after visit  Peripheral focal chorioretinal inflammation of both eyes  Optic nerve edema  --Management with immunosuppressants as above --Continue follow-up with Opthalmology  Hypercalcemia secondary to sarcoid - within normal-[place] --Will obtain CBC and CMP q3 months to monitor cell counts and LFTs. 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 Johnney Ou --Monthly CBC w/diff, CMP  Parkinson's Roth  Peripheral neuropathy --Followed by [facility] Neurology: Dr. Lezlie Octave --Following Dr. Harriette Ohara at [facility] to evaluate. MRI neg for neurosarcoid   Health Maintenance Immunization History  Administered Date(s) Administered  . Influenza Split 12/06/2019  . Influenza, High Dose Seasonal PF 11/30/2015, 11/16/2016, 10/29/2017, 10/30/2018  . Influenza, Seasonal, Injecte, Preservative Fre 10/20/2012  . Influenza,inj,Quad PF,6+ Mos 11/18/2013  . Influenza-Unspecified 11/18/2013, 11/30/2014, 10/29/2017, 10/29/2018  . Moderna Sars-Covid-2 Vaccination 03/12/2019, 04/09/2019, 12/06/2019  . Pneumococcal Conjugate-13 01/01/2013  . Pneumococcal Polysaccharide-23 01/05/2014, 02/25/2020  . Tdap 11/06/2010  . Zoster 11/06/2010  . Zoster Recombinat  (Shingrix) 01/10/2018, 03/31/2018   CT Lung Screen - not qualified  Orders Placed This Encounter  Procedures  . EKG 12-Lead   Meds ordered this encounter  Medications  . predniSONE (DELTASONE) 10 MG tablet    Sig: Take 4 tablets (40 mg total) by mouth daily with breakfast for 3 days, THEN 3 tablets (30 mg total) daily with breakfast for 3 days, THEN 2 tablets (20 mg total) daily with breakfast for 3 days, THEN 1 tablet (10 mg total) daily with breakfast for 3 days.    Dispense:  30 tablet    Refill:  0    No follow-ups on file.  I have spent a total time of 32-minutes on the day of the appointment reviewing prior documentation, coordinating care and discussing medical diagnosis and plan with the patient/family. Imaging, labs and tests included in this note have been reviewed and interpreted independently by me.   [place], MD [facility] 04/17/2020 4:13 PM  Office Number [phone]

## 2020-04-21 DIAGNOSIS — I10 Essential (primary) hypertension: Secondary | ICD-10-CM | POA: Diagnosis not present

## 2020-04-21 DIAGNOSIS — F3341 Major depressive disorder, recurrent, in partial remission: Secondary | ICD-10-CM | POA: Diagnosis not present

## 2020-04-21 DIAGNOSIS — Z7189 Other specified counseling: Secondary | ICD-10-CM | POA: Diagnosis not present

## 2020-04-21 DIAGNOSIS — D869 Sarcoidosis, unspecified: Secondary | ICD-10-CM | POA: Diagnosis not present

## 2020-04-21 DIAGNOSIS — Z0000 Encounter for general adult medical examination without abnormal findings: Secondary | ICD-10-CM | POA: Diagnosis not present

## 2020-04-21 DIAGNOSIS — G20 Parkinson's disease: Secondary | ICD-10-CM | POA: Diagnosis not present

## 2020-04-21 DIAGNOSIS — E785 Hyperlipidemia, unspecified: Secondary | ICD-10-CM | POA: Diagnosis not present

## 2020-04-25 NOTE — Progress Notes (Signed)
Virtual Visit via Telephone Note  I connected with Destiny Roth on 04/25/20 at  2:00 PM EDT by telephone and verified that I am speaking with the correct person using two identifiers.  Location: Patient: Home  Provider: Office    I discussed the limitations, risks, security and privacy concerns of performing an evaluation and management service by telephone and the availability of in person appointments. I also discussed with the patient that there may be a patient responsible charge related to this service. The patient expressed understanding and agreed to proceed.   History of Present Illness: 70 year old female, passive smoke exposure.  Past medical history significant for sarcoidosis, obstructive sleep apnea, hypertension, coronary artery calcification, GERD, diverticulosis, Parkinson's disease, osteoporosis, hyperlipidemia, memory impairment.  Patient of Dr. Loanne Drilling, last seen in office on 04/17/2020.    Biopsy on 02/26/2019 showed normocellular marrow with noncaseatig granulomatous inflammation.  Pulmonary function testing in 2015 showed restrictive defect felt to be related to Parkinson's.  She had repeat PFTs in May 2021 that showed normal lung function.  Echocardiogram in March 2021 showed grade 1 diastolic dysfunction, EKG March 2022 with unchanged prolonged PR interval.  Sarcoid PET 01/07/20 negative for cardiac involvement.  Maintained on CellCept 500 mg twice daily.  04/26/2020 Patient contacted today for a 1 week follow-up televisit.  She has a history of sarcoidosis with endocrine, ocular and renal involvement. PET scan done at [facility] suggestive of pulmonary involvement, negative for cardiac involvement.  During patient's last visit with Dr. Loanne Drilling on 3/21 patient was started on prednisone taper for worsening respiratory symptoms. Patient reports chest pressure with activity, this has improved somewhat with oral prednisone. She still will experience symptoms on occasion but not  near as bad. She will start 10mg  tomorrow. She is afraid of taking prednisone too long d/t weight gain and insomnia. She has a follow-up on April 21st with Dr. Loanne Drilling.   Observations/Objective:  - Able to speak in full sentences; no overt shortness of breath, wheezing or cough  Assessment and Plan:  Sarcoidosis: - History of sarcoidosis with endocrine, ocular and renal involvement. Biopsy on 02/26/2019 showed normocellular marrow with noncaseatig granulomatous inflammation. PET scan 01/07/20 at [facility] suggestive of pulmonary involvement, negative for cardiac involvement. During last visit, chest pressure/respiratory symptoms felt to be related to pulmonary sarcoidosis flare. Her symptoms have improved with oral prednisone. Recommend patient continue prednisone taper until complete. She has three days left of 10mg  dose. If chest pressure returns or worsens off oral steriods advised patient to notify our office.   Coronary artery disease: - Moderate to severe coronary artery calcifications seen on CT imaging  - Recommend referring to cardiology   Follow Up Instructions:   - April 21st with Dr. Loanne Drilling at 11:45   I discussed the assessment and treatment plan with the patient. The patient was provided an opportunity to ask questions and all were answered. The patient agreed with the plan and demonstrated an understanding of the instructions.   The patient was advised to call back or seek an in-person evaluation if the symptoms worsen or if the condition fails to improve as anticipated.  I provided 25 minutes of non-face-to-face time during this encounter.   Martyn Ehrich, NP

## 2020-04-26 ENCOUNTER — Ambulatory Visit (INDEPENDENT_AMBULATORY_CARE_PROVIDER_SITE_OTHER): Payer: Medicare Other | Admitting: Primary Care

## 2020-04-26 ENCOUNTER — Encounter: Payer: Self-pay | Admitting: Primary Care

## 2020-04-26 ENCOUNTER — Other Ambulatory Visit: Payer: Self-pay

## 2020-04-26 DIAGNOSIS — I2510 Atherosclerotic heart disease of native coronary artery without angina pectoris: Secondary | ICD-10-CM

## 2020-04-26 DIAGNOSIS — D869 Sarcoidosis, unspecified: Secondary | ICD-10-CM

## 2020-04-26 NOTE — Patient Instructions (Signed)
Finish prednisone taper, if symptoms return or worsen notify office

## 2020-04-27 DIAGNOSIS — H4710 Unspecified papilledema: Secondary | ICD-10-CM | POA: Diagnosis not present

## 2020-04-27 DIAGNOSIS — H3581 Retinal edema: Secondary | ICD-10-CM | POA: Diagnosis not present

## 2020-04-27 DIAGNOSIS — H30033 Focal chorioretinal inflammation, peripheral, bilateral: Secondary | ICD-10-CM | POA: Diagnosis not present

## 2020-04-27 DIAGNOSIS — Z79899 Other long term (current) drug therapy: Secondary | ICD-10-CM | POA: Insufficient documentation

## 2020-04-27 DIAGNOSIS — H44113 Panuveitis, bilateral: Secondary | ICD-10-CM | POA: Insufficient documentation

## 2020-04-27 DIAGNOSIS — D869 Sarcoidosis, unspecified: Secondary | ICD-10-CM | POA: Diagnosis not present

## 2020-04-28 ENCOUNTER — Other Ambulatory Visit: Payer: Self-pay

## 2020-04-28 ENCOUNTER — Other Ambulatory Visit (INDEPENDENT_AMBULATORY_CARE_PROVIDER_SITE_OTHER): Payer: Medicare Other

## 2020-04-28 ENCOUNTER — Ambulatory Visit
Admission: RE | Admit: 2020-04-28 | Discharge: 2020-04-28 | Disposition: A | Discharge status: Home / Self Care | Payer: Medicare Other | Source: Ambulatory Visit | Attending: Internal Medicine | Admitting: Internal Medicine

## 2020-04-28 DIAGNOSIS — M8580 Other specified disorders of bone density and structure, unspecified site: Secondary | ICD-10-CM

## 2020-04-28 DIAGNOSIS — Z5181 Encounter for therapeutic drug level monitoring: Secondary | ICD-10-CM | POA: Diagnosis not present

## 2020-04-28 DIAGNOSIS — M85852 Other specified disorders of bone density and structure, left thigh: Secondary | ICD-10-CM | POA: Diagnosis not present

## 2020-04-28 LAB — CBC WITH DIFFERENTIAL/PLATELET
Basophils Absolute: 0 10*3/uL (ref 0.0–0.1)
Basophils Relative: 0.3 % (ref 0.0–3.0)
Eosinophils Absolute: 0 10*3/uL (ref 0.0–0.7)
Eosinophils Relative: 0.4 % (ref 0.0–5.0)
HCT: 42.3 % (ref 36.0–46.0)
Hemoglobin: 13.9 g/dL (ref 12.0–15.0)
Lymphocytes Relative: 9.4 % — ABNORMAL LOW (ref 12.0–46.0)
Lymphs Abs: 1 10*3/uL (ref 0.7–4.0)
MCHC: 32.9 g/dL (ref 30.0–36.0)
MCV: 88.3 fl (ref 78.0–100.0)
Monocytes Absolute: 0.6 10*3/uL (ref 0.1–1.0)
Monocytes Relative: 5.4 % (ref 3.0–12.0)
Neutro Abs: 9.1 10*3/uL — ABNORMAL HIGH (ref 1.4–7.7)
Neutrophils Relative %: 84.5 % — ABNORMAL HIGH (ref 43.0–77.0)
Platelets: 330 10*3/uL (ref 150.0–400.0)
RBC: 4.79 Mil/uL (ref 3.87–5.11)
RDW: 14.2 % (ref 11.5–15.5)
WBC: 10.7 10*3/uL — ABNORMAL HIGH (ref 4.0–10.5)

## 2020-04-28 LAB — HEPATIC FUNCTION PANEL
ALT: 3 U/L (ref 0–35)
AST: 9 U/L (ref 0–37)
Albumin: 4.2 g/dL (ref 3.5–5.2)
Alkaline Phosphatase: 60 U/L (ref 39–117)
Bilirubin, Direct: 0.1 mg/dL (ref 0.0–0.3)
Total Bilirubin: 0.3 mg/dL (ref 0.2–1.2)
Total Protein: 6.9 g/dL (ref 6.0–8.3)

## 2020-04-28 LAB — BASIC METABOLIC PANEL
BUN: 35 mg/dL — ABNORMAL HIGH (ref 6–23)
CO2: 27 mEq/L (ref 19–32)
Calcium: 9.8 mg/dL (ref 8.4–10.5)
Chloride: 102 mEq/L (ref 96–112)
Creatinine, Ser: 1.41 mg/dL — ABNORMAL HIGH (ref 0.40–1.20)
GFR: 37.51 mL/min — ABNORMAL LOW (ref >60.00)
Glucose, Bld: 113 mg/dL — ABNORMAL HIGH (ref 70–99)
Potassium: 4 mEq/L (ref 3.5–5.1)
Sodium: 138 mEq/L (ref 135–145)

## 2020-05-01 DIAGNOSIS — M48061 Spinal stenosis, lumbar region without neurogenic claudication: Secondary | ICD-10-CM | POA: Diagnosis not present

## 2020-05-01 DIAGNOSIS — F3289 Other specified depressive episodes: Secondary | ICD-10-CM | POA: Diagnosis not present

## 2020-05-01 DIAGNOSIS — D84821 Immunodeficiency due to drugs: Secondary | ICD-10-CM | POA: Diagnosis not present

## 2020-05-01 DIAGNOSIS — M539 Dorsopathy, unspecified: Secondary | ICD-10-CM | POA: Diagnosis not present

## 2020-05-01 DIAGNOSIS — Z79899 Other long term (current) drug therapy: Secondary | ICD-10-CM | POA: Diagnosis not present

## 2020-05-01 DIAGNOSIS — R419 Unspecified symptoms and signs involving cognitive functions and awareness: Secondary | ICD-10-CM | POA: Diagnosis not present

## 2020-05-01 DIAGNOSIS — D869 Sarcoidosis, unspecified: Secondary | ICD-10-CM | POA: Diagnosis not present

## 2020-05-04 DIAGNOSIS — D849 Immunodeficiency, unspecified: Secondary | ICD-10-CM | POA: Diagnosis not present

## 2020-05-04 DIAGNOSIS — R4189 Other symptoms and signs involving cognitive functions and awareness: Secondary | ICD-10-CM | POA: Diagnosis not present

## 2020-05-16 DIAGNOSIS — Z79899 Other long term (current) drug therapy: Secondary | ICD-10-CM | POA: Diagnosis not present

## 2020-05-16 DIAGNOSIS — D869 Sarcoidosis, unspecified: Secondary | ICD-10-CM | POA: Diagnosis not present

## 2020-05-16 DIAGNOSIS — H30033 Focal chorioretinal inflammation, peripheral, bilateral: Secondary | ICD-10-CM | POA: Diagnosis not present

## 2020-05-16 DIAGNOSIS — H44113 Panuveitis, bilateral: Secondary | ICD-10-CM | POA: Diagnosis not present

## 2020-05-16 DIAGNOSIS — H4710 Unspecified papilledema: Secondary | ICD-10-CM | POA: Diagnosis not present

## 2020-05-16 DIAGNOSIS — H3581 Retinal edema: Secondary | ICD-10-CM | POA: Diagnosis not present

## 2020-05-18 ENCOUNTER — Ambulatory Visit (INDEPENDENT_AMBULATORY_CARE_PROVIDER_SITE_OTHER): Payer: Medicare Other | Admitting: Pulmonary Disease

## 2020-05-18 ENCOUNTER — Other Ambulatory Visit: Payer: Self-pay

## 2020-05-18 ENCOUNTER — Encounter: Payer: Self-pay | Admitting: Pulmonary Disease

## 2020-05-18 VITALS — BP 128/70 | HR 87 | Temp 97.8°F | Ht <= 58 in | Wt 124.6 lb

## 2020-05-18 DIAGNOSIS — M8588 Other specified disorders of bone density and structure, other site: Secondary | ICD-10-CM | POA: Diagnosis not present

## 2020-05-18 DIAGNOSIS — H3090 Unspecified chorioretinal inflammation, unspecified eye: Secondary | ICD-10-CM

## 2020-05-18 DIAGNOSIS — N189 Chronic kidney disease, unspecified: Secondary | ICD-10-CM | POA: Diagnosis not present

## 2020-05-18 DIAGNOSIS — Z92241 Personal history of systemic steroid therapy: Secondary | ICD-10-CM | POA: Diagnosis not present

## 2020-05-18 DIAGNOSIS — D869 Sarcoidosis, unspecified: Secondary | ICD-10-CM | POA: Diagnosis not present

## 2020-05-18 DIAGNOSIS — G20 Parkinson's disease: Secondary | ICD-10-CM

## 2020-05-18 DIAGNOSIS — D8689 Sarcoidosis of other sites: Secondary | ICD-10-CM

## 2020-05-18 DIAGNOSIS — Z7185 Encounter for immunization safety counseling: Secondary | ICD-10-CM | POA: Diagnosis not present

## 2020-05-18 NOTE — Progress Notes (Signed)
Subjective:   PATIENT ID: Destiny Roth: female DOB: 05-Apr-1950, MRN: [id number]   HPI  Chief Complaint  Patient presents with  . Follow-up    Patient is not currently taking Cellcept, prednisone helped with pain in her chest but not her breathing. Wants to talk about Cellcept and Humeria.    Reason for Visit: Hospital follow-up  Destiny Roth is a 70 year old with Parkinson's disease, HTN, HLD and restrictive lung disease who presents for follow-up for sarcoid management.   Synopsis: Initially seen as an inpatient Pulmonary consult by me on 02/23/19 for hypercalcemia and consideration of bronchoscopy to rule out sarcoid. However due to lack of pulmonary symptoms and normal CT at the time, bronchoscopy thought to be low-yield. Bone biopsy was performed and demonstrated granulomas disease without necrosis. She was started on steroids and discharged with Endocrine and Pulmonary follow-up. Of note, she was previously seen in our Pulmonary clinic in 2018 for dyspnea and mild sleep apnea. Her prior PFTs demonstrate mild restrictive defect with normal DLCO which patient stated was attributed to her Parkinson's disease.  09/23/19 Since our last visit, she has established care with multiple providers including Nephrology and Ophthalmology, the latter who is managing her immunosuppressants. She is currently on tacrolimus 1 mg BID, methotrexate and prednisone. Her prednisone is being weaned. She continues to have fatigue and visual blurriness. Her dyspnea also remains unchanged. Worsens with exertion. Her fatigue seems to be associated with this as well.  11/24/19 She is currently on methotrexate 12.5 mg weekly and on prednisone taper. Tacrolimus was discontinued by ophthalmology due tremors. Overall she is not feeling well with difficulty concentrating, balancing and general weakness. She reports visuo-spatial/depth perception issues and tinnitis (L>R) which she is going to see  a sub-specialist in Neurology. 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 She reports unchanged chest pressure and difficulty with deep breaths. Her breaths have to be shallow. This seems to be occurring more frequently. No wheezing or cough.  02/22/20 She is no longer on methotrexate.  She was started on CellCept in November 2021.  She still continues to have issues with fatigue, tremors and word finding after switching to Cellcept.  She also reports lower extremity aching, no shooting or burning pain. When seen by her neurologist, she reports that her Parkinson's is stable. She was referred to Dr. Harriette Ohara to evaluate for neuro involvement of her sarcoid. She is planned for an MRI of her spine and an EMG to evaluate for neuropathy   04/17/20 Since our last visit, her cellcept was increased. Her ophthalmology note from 03/14/20 by Dr. Manuella Ghazi was reviewed. She reported visual abnormalities however her ocular exam shows stable inflammation so she was advised to continue her current dose of cellcept. She reports worsening shortness of breath and substernal chest pain. Worsens with exertion. Denies wheezing.   05/19/20 She has completed her steroids and feels it made a significant difference in the chest pressure she was feeling but her shortness of breath remains persistent. Her ophthalmology note from 05/16/20 by Dr. Manuella Ghazi was reviewed. Due to reported side effects, she has requested stopping taking immunosuppressants to see if these are the cause of her symptoms of fatigue, memory and tremor issues. Dr. Manuella Ghazi does want to resume immunosuppressants and has offered Humira as the next line in treatment. She reports hesitancy in starting any other medications at this time.  Social History: Never smoker  I have personally reviewed patient's past medical/family/social history/allergies/current  medications.  Past Medical History:  Diagnosis Date  . Abdominal pain, epigastric 09/18/2009  . Anosmia 11/06/2010  . ANXIETY 09/23/2006  . BUNION, RIGHT FOOT 09/18/2009  . CARPAL TUNNEL SYNDROME, BILATERAL 09/23/2006  . DEPRESSION 09/23/2006  . DIVERTICULOSIS, COLON 09/23/2006  . GERD 05/24/2008  . GLUCOSE INTOLERANCE 05/24/2008  . Hemorrhoids   . Hypercalcemia   . HYPERLIPIDEMIA 09/23/2006  . HYPERTENSION 09/23/2006  . IBS 09/23/2006  . Impaired glucose tolerance 11/04/2010  . OSTEOPOROSIS 05/24/2008   06/01/19-pt states osteopenia  . Parkinson's disease ([place])   . Restrictive lung disease 12/20/2013  . Sarcoidosis 02/26/2019      Outpatient Medications Prior to Visit  Medication Sig Dispense Refill  . acetaminophen (TYLENOL) 500 MG tablet Take 1,000 mg by mouth every 6 (six) hours as needed for mild pain.    . Amantadine HCl 100 MG tablet Take 1 tablet (100 mg total) by mouth daily.    Marland Kitchen amLODipine (NORVASC) 10 MG tablet Take 1 tablet (10 mg total) by mouth daily. 30 tablet 1  . aspirin 81 MG tablet Take 81 mg by mouth daily.    . carbidopa-levodopa (SINEMET IR) 25-100 MG tablet TAKE ONE TABLET BY MOUTH THREE TIMES DAILY (Patient taking differently: Take 1 tablet by mouth See admin instructions. Take 1 tablet by mouth four times a day- 8 AM, 12 PM, and between 5-6 PM) 270 tablet 1  . Carbidopa-Levodopa ER (SINEMET CR) 25-100 MG tablet controlled release Take 1 tablet by mouth 2 (two) times daily. 60 tablet 0  . dicyclomine (BENTYL) 20 MG tablet Take 20 mg by mouth 4 (four) times daily as needed for spasms.    . diphenhydrAMINE (BENADRYL) 25 MG tablet Take 25 mg by mouth at bedtime as needed for sleep.    . diphenhydramine-acetaminophen (TYLENOL PM) 25-500 MG TABS tablet Take 1 tablet by mouth at bedtime as needed.    . gabapentin (NEURONTIN) 100 MG capsule Take 100 mg by mouth 3 (three) times daily. Can take 273m tid as needed    . mycophenolate (CELLCEPT) 500 MG tablet Take 1 tablet (500 mg  total) by mouth 2 (two) times daily. 60 tablet 3  . rOPINIRole (REQUIP) 1 MG tablet Take 1 tablet (1 mg total) by mouth 3 (three) times daily. (Patient taking differently: Take 1 mg by mouth See admin instructions. Take 1 mg by mouth three times a day- 8 AM, 12 PM, and between 5-6 PM) 90 tablet 0  . rosuvastatin (CRESTOR) 5 MG tablet Take 5 mg by mouth every evening.     . senna (SENOKOT) 8.6 MG TABS tablet Take 1 tablet (8.6 mg total) by mouth 2 (two) times daily. 30 tablet 0  . sertraline (ZOLOFT) 100 MG tablet Take 100 mg by mouth daily.     No facility-administered medications prior to visit.    Review of Systems  Constitutional: Positive for malaise/fatigue. Negative for chills, diaphoresis, fever and weight loss.  HENT: Negative for congestion.   Eyes:       Floaters, hallucinations  Respiratory: Positive for shortness of breath. Negative for cough, hemoptysis, sputum production and wheezing.   Cardiovascular: Negative for chest pain (chest pressure), palpitations and leg swelling.  Neurological: Positive for tremors, weakness and headaches. Negative for focal weakness.     Objective:   Vitals:   05/18/20 1153  BP: 128/70  Pulse: 87  Temp: 97.8 F (36.6 C)  TempSrc:  Temporal  SpO2: 98%  Weight: 124 lb 9.6 oz (56.5 kg)  Height: '4\' 9"'  (1.448 m)      Physical Exam: General: Well-appearing, no acute distress HENT: [place], AT, OP clear, MMM Eyes: EOMI, no scleral icterus Respiratory: Clear to auscultation bilaterally.  No crackles, wheezing or rales Cardiovascular: RRR, -M/R/G, no JVD Extremities:-Edema,-tenderness Neuro: AAO x4, CNII-XII grossly intact Skin: Intact, no rashes or bruising Psych: Anxious mood, normal affect  Data Reviewed:  Imaging: CXR 02/23/19 - interval resolutions of bilateral pleural effusions CT Chest 02/09/19 - Bilateral pleural effusions. Mild ground glass opacifications bilaterally. No enlarged mediastinal or hilar adenopathy. PET Myocardial  01/07/20 - No evidence of increased uptake PET/CT 01/07/20 1. Bilateral ground-glass pulmonary opacities demonstrate mild-moderate FDG avidity. Thesefindings are nonspecific and may be infectious or inflammatory.2. Mild FDG activity of non-enlarged bilateral hilar lymph nodes is also nonspecific. This could be reactive in the setting of the pulmonary findings, but activity related to sarcoidcannot be excluded.  CXR 03/30/20 - No infiltrate, effusion or edema.   PFT: 12/17/13 FVC 2.26 (86%) FEV1 1.88 (93%) Ratio 78  TLC 70% DLCO 75% Interpretation: Mild restrictive defect with mild reduction in gas exchange (uncorrected DLCO)  06/22/19 FVC 2.07 (95%) FEV1 1.75 (107%) Ratio 80  TLC 91% DLCO 63% Interpretation: No obstructive or restrictive defect. Mild reduction in gas exchange   Echocardiogram: 04/22/19 - EF 60-65%. Grade I DD. No WMA or valvular abnormalities.  CBC    Component Value Date/Time   WBC 10.7 (H) 04/28/2020 1357   RBC 4.79 04/28/2020 1357   HGB 13.9 04/28/2020 1357   HCT 42.3 04/28/2020 1357   PLT 330.0 04/28/2020 1357   MCV 88.3 04/28/2020 1357   MCH 29.2 03/29/2020 2356   MCHC 32.9 04/28/2020 1357   RDW 14.2 04/28/2020 1357   LYMPHSABS 1.0 04/28/2020 1357   MONOABS 0.6 04/28/2020 1357   EOSABS 0.0 04/28/2020 1357   BASOSABS 0.0 04/28/2020 1357   BMP Latest Ref Rng & Units 04/28/2020 03/29/2020 03/29/2020  Glucose 70 - 99 mg/dL 113(H) 110(H) 103(H)  BUN 6 - 23 mg/dL 35(H) 28(H) 30(H)  Creatinine 0.40 - 1.20 mg/dL 1.41(H) 1.49(H) 1.47(H)  BUN/Creat Ratio 6 - 22 (calc) - - -  Sodium 135 - 145 mEq/L 138 139 136  Potassium 3.5 - 5.1 mEq/L 4.0 4.0 4.3  Chloride 96 - 112 mEq/L 102 106 103  CO2 19 - 32 mEq/L 27 19(L) 24  Calcium 8.4 - 10.5 mg/dL 9.8 10.0 10.3    Hepatic Function Latest Ref Rng & Units 04/28/2020 03/30/2020 03/29/2020  Total Protein 6.0 - 8.3 g/dL 6.9 6.9 7.7  Albumin 3.5 - 5.2 g/dL 4.2 3.8 4.4  AST 0 - 37 U/L '9 16 14  ' ALT 0 - 35 U/L '3 8 8  ' Alk Phosphatase  39 - 117 U/L 60 47 60  Total Bilirubin 0.2 - 1.2 mg/dL 0.3 0.5 0.3  Bilirubin, Direct 0.0 - 0.3 mg/dL 0.1 0.1 0.0   Labs reviewed from 04/28/20 Ca 9.8 Stable CKD WBC 10.4 Normal LFTs  Imaging, labs and test noted above have been reviewed independently by me.  Assessment & Plan:   Discussion: 70 year old female with Parkinson's disease and sarcoidosis with endocrine, ocular and renal involvement. Bone biopsy on 02/26/19 confirmed diagnosis of sarcoid. Completed steroid taper in March 2022 for pulmonary sarcoid flare-up. We discussed extensively the risk of being off immunosuppressants in the setting of active sarcoid disease including worsening of organ involvement including lungs, eyes and kidneys.  It is unclear if the medications she has tried so far have caused her multitude of symptoms vs her Parkinson's. Her medication options are limited and Humira is a reasonable drug that has shown stability/benefit in sarcoid patients with ocular involvement. If in the future she no longer wishes to be on immunosuppressants, I have recommended palliative care. She expresses understanding with the risk of holding meds as noted above and wishes to be re-evaluated in 3-4 weeks to further discuss medication alternatives.  Sarcoidosis  --Last PFT in 2015 demonstrated restrictive defect thought to be related to Parkinson's. PFT 05/2019 normal. Will repeat annual PFTs in setting of recent dyspnea --Echo reviewed from 03/2019 with grade I DD. EKG 03/2020 with unchanged prolonged PR interval   --Sarcoid PET - negative for cardiac involvement. Suggests pulmonary involvement --Immunosuppressants per Optho for now: Holiday from Cellcept due to medication intolerance. Considering Humira  Peripheral focal chorioretinal inflammation of both eyes  Posterior uveitis  Optic nerve edema  --Management with immunosuppressants as above --Continue follow-up with Opthalmology  History of hypercalcemia secondary to sarcoid -  normal calcium range --Will obtain CBC and CMP q3 months to monitor cell counts and LFTs. 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 Johnney Ou --Monthly CBC w/diff, CMP  Parkinson's disease  Peripheral neuropathy --Followed by [facility] [place] [facility]: Dr. Lezlie Octave --Following Dr. Harriette Ohara at [facility] to evaluate. MRI neg for neurosarcoid   Health Maintenance Immunization History  Administered Date(s) Administered  . Influenza Split 12/06/2019  . Influenza, High Dose Seasonal PF 11/30/2015, 11/16/2016, 10/29/2017, 10/30/2018  . Influenza, Seasonal, Injecte, Preservative Fre 10/20/2012  . Influenza,inj,Quad PF,6+ Mos 11/18/2013  . Influenza-Unspecified 11/18/2013, 11/30/2014, 10/29/2017, 10/29/2018  . Moderna Sars-Covid-2 Vaccination 03/12/2019, 04/09/2019, 12/06/2019  . Pneumococcal Conjugate-13 01/01/2013  . Pneumococcal Polysaccharide-23 01/05/2014, 02/25/2020  . Tdap 11/06/2010  . Zoster 11/06/2010  . Zoster Recombinat (Shingrix) 01/10/2018, 03/31/2018   CT Lung Screen - not qualified  No orders of the defined types were placed in this encounter.  No orders of the defined types were placed in this encounter.   No follow-ups on file.  I have spent a total time of 50-minutes on the day of the appointment reviewing prior documentation, coordinating care and discussing medical diagnosis and plan with the patient/family. Discussed management with Dr. Manuella Ghazi via telephone. Imaging, labs and tests included in this note have been reviewed and interpreted independently by me.   [place], MD [facility] 05/18/2020 11:45 AM  Office Number [phone]

## 2020-05-18 NOTE — Patient Instructions (Addendum)
Discussed holding cellcept for three weeks to assess drug side effects At our next visit, we will discuss initiating Humira  Follow-up with me on 06/09/20 at 4:15 PM

## 2020-05-24 ENCOUNTER — Other Ambulatory Visit (INDEPENDENT_AMBULATORY_CARE_PROVIDER_SITE_OTHER): Payer: Medicare Other

## 2020-05-24 DIAGNOSIS — Z5181 Encounter for therapeutic drug level monitoring: Secondary | ICD-10-CM | POA: Diagnosis not present

## 2020-05-25 LAB — CBC WITH DIFFERENTIAL/PLATELET
Basophils Absolute: 0.1 10*3/uL (ref 0.0–0.1)
Basophils Relative: 0.9 % (ref 0.0–3.0)
Eosinophils Absolute: 0.1 10*3/uL (ref 0.0–0.7)
Eosinophils Relative: 2.1 % (ref 0.0–5.0)
HCT: 39 % (ref 36.0–46.0)
Hemoglobin: 13.1 g/dL (ref 12.0–15.0)
Lymphocytes Relative: 21.8 % (ref 12.0–46.0)
Lymphs Abs: 1.2 10*3/uL (ref 0.7–4.0)
MCHC: 33.5 g/dL (ref 30.0–36.0)
MCV: 89.2 fl (ref 78.0–100.0)
Monocytes Absolute: 0.4 10*3/uL (ref 0.1–1.0)
Monocytes Relative: 7.8 % (ref 3.0–12.0)
Neutro Abs: 3.8 10*3/uL (ref 1.4–7.7)
Neutrophils Relative %: 67.4 % (ref 43.0–77.0)
Platelets: 343 10*3/uL (ref 150.0–400.0)
RBC: 4.37 Mil/uL (ref 3.87–5.11)
RDW: 14.1 % (ref 11.5–15.5)
WBC: 5.6 10*3/uL (ref 4.0–10.5)

## 2020-05-25 LAB — BASIC METABOLIC PANEL
BUN: 20 mg/dL (ref 6–23)
CO2: 29 mEq/L (ref 19–32)
Calcium: 10 mg/dL (ref 8.4–10.5)
Chloride: 101 mEq/L (ref 96–112)
Creatinine, Ser: 1.48 mg/dL — ABNORMAL HIGH (ref 0.40–1.20)
GFR: 35.37 mL/min — ABNORMAL LOW (ref >60.00)
Glucose, Bld: 114 mg/dL — ABNORMAL HIGH (ref 70–99)
Potassium: 4.1 mEq/L (ref 3.5–5.1)
Sodium: 138 mEq/L (ref 135–145)

## 2020-05-25 LAB — HEPATIC FUNCTION PANEL
ALT: 6 U/L (ref 0–35)
AST: 15 U/L (ref 0–37)
Albumin: 4.2 g/dL (ref 3.5–5.2)
Alkaline Phosphatase: 56 U/L (ref 39–117)
Bilirubin, Direct: 0.1 mg/dL (ref 0.0–0.3)
Total Bilirubin: 0.3 mg/dL (ref 0.2–1.2)
Total Protein: 6.9 g/dL (ref 6.0–8.3)

## 2020-06-01 DIAGNOSIS — Z1231 Encounter for screening mammogram for malignant neoplasm of breast: Secondary | ICD-10-CM | POA: Diagnosis not present

## 2020-06-06 ENCOUNTER — Other Ambulatory Visit (HOSPITAL_COMMUNITY)
Admission: RE | Admit: 2020-06-06 | Discharge: 2020-06-06 | Disposition: A | Discharge status: Home / Self Care | Payer: Medicare Other | Source: Ambulatory Visit | Attending: Pulmonary Disease | Admitting: Pulmonary Disease

## 2020-06-06 DIAGNOSIS — Z01812 Encounter for preprocedural laboratory examination: Secondary | ICD-10-CM | POA: Insufficient documentation

## 2020-06-06 DIAGNOSIS — Z20822 Contact with and (suspected) exposure to covid-19: Secondary | ICD-10-CM | POA: Insufficient documentation

## 2020-06-06 LAB — SARS CORONAVIRUS 2 (TAT 6-24 HRS): SARS Coronavirus 2: NEGATIVE

## 2020-06-07 ENCOUNTER — Other Ambulatory Visit: Payer: Self-pay | Admitting: *Deleted

## 2020-06-07 DIAGNOSIS — D869 Sarcoidosis, unspecified: Secondary | ICD-10-CM

## 2020-06-09 ENCOUNTER — Other Ambulatory Visit (HOSPITAL_COMMUNITY): Payer: Self-pay

## 2020-06-09 ENCOUNTER — Ambulatory Visit: Payer: Medicare Other | Admitting: Pulmonary Disease

## 2020-06-09 ENCOUNTER — Encounter: Payer: Self-pay | Admitting: Pulmonary Disease

## 2020-06-09 ENCOUNTER — Telehealth: Payer: Self-pay

## 2020-06-09 ENCOUNTER — Other Ambulatory Visit: Payer: Self-pay

## 2020-06-09 ENCOUNTER — Ambulatory Visit (INDEPENDENT_AMBULATORY_CARE_PROVIDER_SITE_OTHER): Payer: Medicare Other | Admitting: Pulmonary Disease

## 2020-06-09 VITALS — BP 124/80 | HR 94 | Temp 97.5°F | Ht <= 58 in | Wt 124.0 lb

## 2020-06-09 DIAGNOSIS — G20 Parkinson's disease: Secondary | ICD-10-CM

## 2020-06-09 DIAGNOSIS — D869 Sarcoidosis, unspecified: Secondary | ICD-10-CM

## 2020-06-09 DIAGNOSIS — R0989 Other specified symptoms and signs involving the circulatory and respiratory systems: Secondary | ICD-10-CM | POA: Diagnosis not present

## 2020-06-09 DIAGNOSIS — H3090 Unspecified chorioretinal inflammation, unspecified eye: Secondary | ICD-10-CM | POA: Diagnosis not present

## 2020-06-09 LAB — PULMONARY FUNCTION TEST
DL/VA % pred: 91 %
DL/VA: 3.99 ml/min/mmHg/L
DLCO cor % pred: 95 %
DLCO cor: 14.25 ml/min/mmHg
DLCO unc % pred: 94 %
DLCO unc: 14.11 ml/min/mmHg
FEF 25-75 Post: 2.22 L/sec
FEF 25-75 Pre: 1.68 L/sec
FEF2575-%Change-Post: 32 %
FEF2575-%Pred-Post: 152 %
FEF2575-%Pred-Pre: 115 %
FEV1-%Change-Post: 7 %
FEV1-%Pred-Post: 113 %
FEV1-%Pred-Pre: 105 %
FEV1-Post: 1.8 L
FEV1-Pre: 1.67 L
FEV1FVC-%Change-Post: 4 %
FEV1FVC-%Pred-Pre: 108 %
FEV6-%Change-Post: 3 %
FEV6-%Pred-Post: 104 %
FEV6-%Pred-Pre: 101 %
FEV6-Post: 2.1 L
FEV6-Pre: 2.04 L
FEV6FVC-%Pred-Post: 105 %
FEV6FVC-%Pred-Pre: 105 %
FVC-%Change-Post: 3 %
FVC-%Pred-Post: 99 %
FVC-%Pred-Pre: 95 %
FVC-Post: 2.11 L
FVC-Pre: 2.04 L
Post FEV1/FVC ratio: 85 %
Post FEV6/FVC ratio: 100 %
Pre FEV1/FVC ratio: 82 %
Pre FEV6/FVC Ratio: 100 %
RV % pred: 162 %
RV: 3 L
TLC % pred: 132 %
TLC: 5.24 L

## 2020-06-09 MED ORDER — INCRUSE ELLIPTA 62.5 MCG/INH IN AEPB: 1.0000 | INHALATION_SPRAY | Freq: Every day | RESPIRATORY_TRACT | 6 refills | Status: DC

## 2020-06-09 NOTE — Progress Notes (Signed)
Full PFT performed today. °

## 2020-06-09 NOTE — Patient Instructions (Signed)
Full PFT performed today. °

## 2020-06-09 NOTE — Progress Notes (Signed)
Subjective:   PATIENT ID: Destiny Roth: female DOB: August 05, 1950, MRN: [id number]   HPI  Chief Complaint  Patient presents with  . Follow-up    Feeling better at times, been off cellcept for 3 weeks. Feels like she is not getting enough air in and out. Says it is not SOB. Some dry coughing, and feeling like she needs to clear throat    Reason for Visit: Hospital follow-up  [name] is a 70 year old with Parkinson's disease, HTN, HLD and restrictive lung disease who presents for follow-up for sarcoid management.   Synopsis: Initially seen as an inpatient Pulmonary consult by me on 02/23/19 for hypercalcemia and consideration of bronchoscopy to rule out sarcoid. However due to lack of pulmonary symptoms and normal CT at the time, bronchoscopy thought to be low-yield. Bone biopsy was performed and demonstrated granulomas disease without necrosis. She was started on steroids and discharged with Endocrine and Pulmonary follow-up. Of note, she was previously seen in our Pulmonary clinic in 2018 for dyspnea and mild sleep apnea. Her prior PFTs demonstrate mild restrictive defect with normal DLCO which patient stated was attributed to her Parkinson's disease.  09/23/19 Since our last visit, she has established care with multiple providers including Nephrology and Ophthalmology, the latter who is managing her immunosuppressants. She is currently on tacrolimus 1 mg BID, methotrexate and prednisone. Her prednisone is being weaned. She continues to have fatigue and visual blurriness. Her dyspnea also remains unchanged. Worsens with exertion. Her fatigue seems to be associated with this as well.  11/24/19 She is currently on methotrexate 12.5 mg weekly and on prednisone taper. Tacrolimus was discontinued by ophthalmology due tremors. Overall she is not feeling well with difficulty concentrating, balancing and general weakness. She reports visuo-spatial/depth perception issues  and tinnitis (L>R) which she is going to see a sub-specialist in Neurology. 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 She reports unchanged chest pressure and difficulty with deep breaths. Her breaths have to be shallow. This seems to be occurring more frequently. No wheezing or cough.  02/22/20 She is no longer on methotrexate.  She was started on CellCept in November 2021.  She still continues to have issues with fatigue, tremors and word finding after switching to Cellcept.  She also reports lower extremity aching, no shooting or burning pain. When seen by her neurologist, she reports that her Parkinson's is stable. She was referred to Dr. Harriette Ohara to evaluate for neuro involvement of her sarcoid. She is planned for an MRI of her spine and an EMG to evaluate for neuropathy   04/17/20 Since our last visit, her cellcept was increased. Her ophthalmology note from 03/14/20 by Dr. Manuella Ghazi was reviewed. She reported visual abnormalities however her ocular exam shows stable inflammation so she was advised to continue her current dose of cellcept. She reports worsening shortness of breath and substernal chest pain. Worsens with exertion. Denies wheezing.   05/19/20 She has completed her steroids and feels it made a significant difference in the chest pressure she was feeling but her shortness of breath remains persistent. Her ophthalmology note from 05/16/20 by Dr. Manuella Ghazi was reviewed. Due to reported side effects, she has requested stopping taking immunosuppressants to see if these are the cause of her symptoms of fatigue, memory and tremor issues. Dr. Manuella Ghazi does want to resume immunosuppressants and has offered Humira as the next line  in treatment. She reports hesitancy in starting any other medications at this time.  06/09/20 She reports her vision is the same with floaters. Her fatigue and tremors have  improved off the cellcept however it remains persistent. She will have good days and bad days. Unclear if her memory is better off cellcept. She had questions regarding Humira and if cellcept or methotrexate would be tried again. For her breathing she still has difficulty getting a full breath in and believes this contributes to her fatigue as well.  Social History: Never smoker  I have personally reviewed patient's past medical/family/social history/allergies/current medications.  Past Medical History:  Diagnosis Date  . Abdominal pain, epigastric 09/18/2009  . Anosmia 11/06/2010  . ANXIETY 09/23/2006  . BUNION, RIGHT FOOT 09/18/2009  . CARPAL TUNNEL SYNDROME, BILATERAL 09/23/2006  . DEPRESSION 09/23/2006  . DIVERTICULOSIS, COLON 09/23/2006  . GERD 05/24/2008  . GLUCOSE INTOLERANCE 05/24/2008  . Hemorrhoids   . Hypercalcemia   . HYPERLIPIDEMIA 09/23/2006  . HYPERTENSION 09/23/2006  . IBS 09/23/2006  . Impaired glucose tolerance 11/04/2010  . OSTEOPOROSIS 05/24/2008   06/01/19-pt states osteopenia  . Parkinson's disease ([place])   . Restrictive lung disease 12/20/2013  . Sarcoidosis 02/26/2019      Outpatient Medications Prior to Visit  Medication Sig Dispense Refill  . acetaminophen (TYLENOL) 500 MG tablet Take 1,000 mg by mouth every 6 (six) hours as needed for mild pain.    . Amantadine HCl 100 MG tablet Take 1 tablet (100 mg total) by mouth daily.    Marland Kitchen amLODipine (NORVASC) 10 MG tablet Take 1 tablet (10 mg total) by mouth daily. 30 tablet 1  . aspirin 81 MG tablet Take 81 mg by mouth daily.    . carbidopa-levodopa (SINEMET IR) 25-100 MG tablet TAKE ONE TABLET BY MOUTH THREE TIMES DAILY (Patient taking differently: Take 1 tablet by mouth See admin instructions. Take 1 tablet by mouth four times a day- 8 AM, 12 PM, and between 5-6 PM) 270 tablet 1  . Carbidopa-Levodopa ER (SINEMET CR) 25-100 MG tablet controlled release Take 1 tablet by mouth 2 (two) times daily. 60 tablet 0  . dicyclomine  (BENTYL) 20 MG tablet Take 20 mg by mouth 4 (four) times daily as needed for spasms.    . diphenhydrAMINE (BENADRYL) 25 MG tablet Take 25 mg by mouth at bedtime as needed for sleep.    . diphenhydramine-acetaminophen (TYLENOL PM) 25-500 MG TABS tablet Take 1 tablet by mouth at bedtime as needed.    . gabapentin (NEURONTIN) 100 MG capsule Take 100 mg by mouth 3 (three) times daily. Can take 261m tid as needed    . mycophenolate (CELLCEPT) 500 MG tablet Take 1 tablet (500 mg total) by mouth 2 (two) times daily. 60 tablet 3  . rOPINIRole (REQUIP) 1 MG tablet Take 1 tablet (1 mg total) by mouth 3 (three) times daily. (Patient taking differently: Take 1 mg by mouth See admin instructions. Take 1 mg by mouth three times a day- 8 AM, 12 PM, and between 5-6 PM) 90 tablet 0  . rosuvastatin (CRESTOR) 5 MG tablet Take 5 mg by mouth every evening.     . senna (SENOKOT) 8.6 MG TABS tablet Take 1 tablet (8.6 mg total) by mouth 2 (two) times daily. 30 tablet 0  . sertraline (ZOLOFT) 100 MG tablet Take 100 mg by mouth daily.     No facility-administered medications prior to visit.    Review of Systems  Constitutional: Positive for malaise/fatigue. Negative  for chills, diaphoresis, fever and weight loss.  HENT: Negative for congestion.   Eyes:       Floaters, hallucinations  Respiratory: Positive for shortness of breath. Negative for cough, hemoptysis, sputum production and wheezing.   Cardiovascular: Negative for chest pain (chest pressure), palpitations and leg swelling.  Neurological: Positive for tremors, weakness and headaches. Negative for focal weakness.     Objective:   Vitals:   06/09/20 1452  BP: 124/80  Pulse: 94  Temp: (!) 97.5 F (36.4 C)  SpO2: 99%  Weight: 124 lb (56.2 kg)  Height: '4\' 8"'  (1.422 m)   SpO2: 99 % O2 Device: None (Room air)  Physical Exam: General: Well-appearing, no acute distress HENT: [place], AT Eyes: EOMI, no scleral icterus Respiratory: Clear to auscultation  bilaterally.  No crackles, wheezing or rales Cardiovascular: RRR, -M/R/G, no JVD Extremities:-Edema,-tenderness Neuro: AAO x4, CNII-XII grossly intact Skin: Intact, no rashes or bruising Psych: Anxious mood, normal affect  Data Reviewed:  Imaging: CXR 02/23/19 - interval resolutions of bilateral pleural effusions CT Chest 02/09/19 - Bilateral pleural effusions. Mild ground glass opacifications bilaterally. No enlarged mediastinal or hilar adenopathy. PET Myocardial 01/07/20 - No evidence of increased uptake PET/CT 01/07/20 1. Bilateral ground-glass pulmonary opacities demonstrate mild-moderate FDG avidity. Thesefindings are nonspecific and may be infectious or inflammatory.2. Mild FDG activity of non-enlarged bilateral hilar lymph nodes is also nonspecific. This could be reactive in the setting of the pulmonary findings, but activity related to sarcoidcannot be excluded.  CXR 03/30/20 - No infiltrate, effusion or edema.   PFT: 12/17/13 FVC 2.26 (86%) FEV1 1.88 (93%) Ratio 78  TLC 70% DLCO 75% Interpretation: Mild restrictive defect with mild reduction in gas exchange (uncorrected DLCO)  06/22/19 FVC 2.07 (95%) FEV1 1.75 (107%) Ratio 80  TLC 91% DLCO 63% Interpretation: No obstructive or restrictive defect. Mild reduction in gas exchange   06/10/20 FVC 2.11 (99%) FEV1 1.8 (113%) Ratio 82  TLC 132% RV 162% RV/TLC 130% DLCO 94% Interpretation: Normal spirometry and gas exchange. Compared to prior PFTs, patient has had increased lung volumes suggestive of air trapping. No significant bronchodilator response however does not preclude benefit. Clinically correlate  Echocardiogram: 04/22/19 - EF 60-65%. Grade I DD. No WMA or valvular abnormalities.  CBC    Component Value Date/Time   WBC 5.6 05/24/2020 1618   RBC 4.37 05/24/2020 1618   HGB 13.1 05/24/2020 1618   HCT 39.0 05/24/2020 1618   PLT 343.0 05/24/2020 1618   MCV 89.2 05/24/2020 1618   MCH 29.2 03/29/2020 2356   MCHC 33.5  05/24/2020 1618   RDW 14.1 05/24/2020 1618   LYMPHSABS 1.2 05/24/2020 1618   MONOABS 0.4 05/24/2020 1618   EOSABS 0.1 05/24/2020 1618   BASOSABS 0.1 05/24/2020 1618   BMP Latest Ref Rng & Units 05/24/2020 04/28/2020 03/29/2020  Glucose 70 - 99 mg/dL 114(H) 113(H) 110(H)  BUN 6 - 23 mg/dL 20 35(H) 28(H)  Creatinine 0.40 - 1.20 mg/dL 1.48(H) 1.41(H) 1.49(H)  BUN/Creat Ratio 6 - 22 (calc) - - -  Sodium 135 - 145 mEq/L 138 138 139  Potassium 3.5 - 5.1 mEq/L 4.1 4.0 4.0  Chloride 96 - 112 mEq/L 101 102 106  CO2 19 - 32 mEq/L 29 27 19(L)  Calcium 8.4 - 10.5 mg/dL 10.0 9.8 10.0    Hepatic Function Latest Ref Rng & Units 05/24/2020 04/28/2020 03/30/2020  Total Protein 6.0 - 8.3 g/dL 6.9 6.9 6.9  Albumin 3.5 - 5.2 g/dL 4.2 4.2 3.8  AST 0 -  37 U/L '15 9 16  ' ALT 0 - 35 U/L '6 3 8  ' Alk Phosphatase 39 - 117 U/L 56 60 47  Total Bilirubin 0.2 - 1.2 mg/dL 0.3 0.3 0.5  Bilirubin, Direct 0.0 - 0.3 mg/dL 0.1 0.1 0.1   Imaging, labs and test noted above have been reviewed independently by me.  Assessment & Plan:   Discussion: 70 year old female with Parkinson's disease and sarcoid with endocrine, ocular and renal involvement who presents for follow-up. Bone biopsy on 02/26/19 confirmed diagnosis of sarcoid. Completed steroid taper in 03/2020 for flare-up. We have previously discussed the risk of being off immunosuppressants in the setting of active sarcoid disease including worsening of organ involvement including lungs, eyes and kidneys right now. It is unclear if the medications she has tried so far have cause her multitude of symptoms vs her Parkinsons vs uncontrolled sarcoid.   Recommend treatment for her ocular involvement with the third line option with Humira due to intolerance to other agents (methotrexate, cellcept). We have started paperwork for Humira enrollment/assistance and patient is willing to consider starting Humira after her evaluation with ophthalmology.  For her pulmonary involvement, I  believe the air trapping seen on her PFTs can explain her current symptoms and will trial LAMA inhaler. Discussed risks and benefits of inhaler which she has expressed understanding and agrees to start.  Sarcoidosis  --Last PFT in 2015 demonstrated restrictive defect thought to be related to Parkinson's. PFT 05/2019 normal. Will repeat annual PFTs in setting of recent dyspnea --Echo reviewed from 03/2019 with grade I DD. EKG 03/2020 with unchanged prolonged PR interval   --Sarcoid PET - negative for cardiac involvement. Suggests pulmonary involvement --Immunosuppressants per Optho for now: Holiday from Cellcept due to medication intolerance. Considering Humira. Patient wishes for Pulmonary to manage. Will discuss with Optho however she will need to continue follow-up as recommended by her specialist.  Peripheral focal chorioretinal inflammation of both eyes  Posterior uveitis  Optic nerve edema  --Management with immunosuppressants as above --Continue follow-up with Ophthalmology  Shortness of breat Air trapping --START Incruse ONE puff ONCE a day  History of hypercalcemia secondary to sarcoid - normal calcium range --Will obtain CBC and CMP q3 months to monitor cell counts and LFTs. Reviewed 04/2020 on last visit. 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 Johnney Ou --Monthly CBC w/diff, CMP  Parkinson's disease  Peripheral neuropathy --[facility]: Dr. Lezlie Octave --Following Dr. Harriette Ohara at [facility] to evaluate. MRI neg for neurosarcoid   Health Maintenance Immunization History  Administered Date(s) Administered  . Influenza Split 12/06/2019  . Influenza, High Dose Seasonal PF 11/30/2015, 11/16/2016, 10/29/2017, 10/30/2018, 12/09/2018  . Influenza, Seasonal, Injecte, Preservative Fre 10/20/2012  . Influenza,inj,Quad PF,6+ Mos 11/18/2013  . Influenza-Unspecified 11/18/2013, 11/30/2014, 10/29/2017, 10/29/2018  .  Moderna Sars-Covid-2 Vaccination 03/12/2019, 04/09/2019, 12/06/2019  . Pneumococcal Conjugate-13 01/01/2013, 01/05/2014  . Pneumococcal Polysaccharide-23 01/01/2013, 01/05/2014, 02/25/2020  . Tdap 11/06/2010  . Zoster 11/06/2010, 01/15/2018, 03/20/2018  . Zoster Recombinat (Shingrix) 01/10/2018, 03/31/2018   CT Lung Screen - not qualified  No orders of the defined types were placed in this encounter.  No orders of the defined types were placed in this encounter.   No follow-ups on file.  I have spent a total time of 50-minutes on the day of the appointment reviewing prior documentation, coordinating care and discussing medical diagnosis and plan with the patient/family. Discussed management with Dr. Manuella Ghazi via telephone. Imaging, labs and  tests included in this note have been reviewed and interpreted independently by me.   [place], MD [facility] 06/09/2020 3:32 PM  Office Number [phone]

## 2020-06-09 NOTE — Patient Instructions (Addendum)
Air trapping on pulmonary function tests --START Incruse ONE puff ONCE a day  Uveitis --Follow-up with Ophthalmology 6/7.  --Please call after your appointment regarding immunosuppressant plan --We will communicate plan to start Humira if approved  Follow-up with me in 2 months

## 2020-06-09 NOTE — Telephone Encounter (Signed)
LVM to ask patient if she is available to move appt up to 3:00 per Dr. Loanne Drilling. If pt calls back, please advise.

## 2020-06-10 ENCOUNTER — Encounter: Payer: Self-pay | Admitting: Pulmonary Disease

## 2020-06-12 DIAGNOSIS — G20 Parkinson's disease: Secondary | ICD-10-CM | POA: Diagnosis not present

## 2020-06-20 ENCOUNTER — Other Ambulatory Visit (INDEPENDENT_AMBULATORY_CARE_PROVIDER_SITE_OTHER): Payer: Medicare Other

## 2020-06-20 DIAGNOSIS — M545 Low back pain, unspecified: Secondary | ICD-10-CM | POA: Diagnosis not present

## 2020-06-20 DIAGNOSIS — M4310 Spondylolisthesis, site unspecified: Secondary | ICD-10-CM | POA: Insufficient documentation

## 2020-06-20 DIAGNOSIS — Z5181 Encounter for therapeutic drug level monitoring: Secondary | ICD-10-CM

## 2020-06-20 DIAGNOSIS — G8929 Other chronic pain: Secondary | ICD-10-CM | POA: Insufficient documentation

## 2020-06-20 DIAGNOSIS — M542 Cervicalgia: Secondary | ICD-10-CM | POA: Diagnosis not present

## 2020-06-21 ENCOUNTER — Telehealth: Payer: Self-pay | Admitting: Pharmacist

## 2020-06-21 DIAGNOSIS — Z79899 Other long term (current) drug therapy: Secondary | ICD-10-CM

## 2020-06-21 LAB — CBC WITH DIFFERENTIAL/PLATELET
Basophils Absolute: 0 10*3/uL (ref 0.0–0.1)
Basophils Relative: 0.5 % (ref 0.0–3.0)
Eosinophils Absolute: 0.1 10*3/uL (ref 0.0–0.7)
Eosinophils Relative: 0.9 % (ref 0.0–5.0)
HCT: 42.1 % (ref 36.0–46.0)
Hemoglobin: 14.1 g/dL (ref 12.0–15.0)
Lymphocytes Relative: 11.3 % — ABNORMAL LOW (ref 12.0–46.0)
Lymphs Abs: 0.9 10*3/uL (ref 0.7–4.0)
MCHC: 33.4 g/dL (ref 30.0–36.0)
MCV: 89.8 fl (ref 78.0–100.0)
Monocytes Absolute: 0.4 10*3/uL (ref 0.1–1.0)
Monocytes Relative: 4.3 % (ref 3.0–12.0)
Neutro Abs: 6.9 10*3/uL (ref 1.4–7.7)
Neutrophils Relative %: 83 % — ABNORMAL HIGH (ref 43.0–77.0)
Platelets: 313 10*3/uL (ref 150.0–400.0)
RBC: 4.69 Mil/uL (ref 3.87–5.11)
RDW: 13.3 % (ref 11.5–15.5)
WBC: 8.3 10*3/uL (ref 4.0–10.5)

## 2020-06-21 LAB — HEPATIC FUNCTION PANEL
ALT: 7 U/L (ref 0–35)
AST: 14 U/L (ref 0–37)
Albumin: 4.5 g/dL (ref 3.5–5.2)
Alkaline Phosphatase: 70 U/L (ref 39–117)
Bilirubin, Direct: 0.1 mg/dL (ref 0.0–0.3)
Total Bilirubin: 0.4 mg/dL (ref 0.2–1.2)
Total Protein: 7.1 g/dL (ref 6.0–8.3)

## 2020-06-21 LAB — BASIC METABOLIC PANEL
BUN: 23 mg/dL (ref 6–23)
CO2: 27 mEq/L (ref 19–32)
Calcium: 10.1 mg/dL (ref 8.4–10.5)
Chloride: 102 mEq/L (ref 96–112)
Creatinine, Ser: 1.45 mg/dL — ABNORMAL HIGH (ref 0.40–1.20)
GFR: 36.23 mL/min — ABNORMAL LOW (ref >60.00)
Glucose, Bld: 132 mg/dL — ABNORMAL HIGH (ref 70–99)
Potassium: 4.2 mEq/L (ref 3.5–5.1)
Sodium: 139 mEq/L (ref 135–145)

## 2020-06-21 NOTE — Telephone Encounter (Addendum)
Please start Humira auto-injector BIV. Will be managed by Dr. Loanne Drilling in conjunction with ophthalmologist, Dr. Dwana Melena  Dose: 40mg  every 14 days (will NOT be doing loading dose after convo)  Dx: H30.90 (posterior uveitis)  Naive to biologics  Knox Saliva, PharmD, MPH Clinical Pharmacist (Rheumatology and Pulmonology)

## 2020-06-22 ENCOUNTER — Other Ambulatory Visit (HOSPITAL_COMMUNITY): Payer: Self-pay

## 2020-06-22 NOTE — Telephone Encounter (Signed)
Received notification from [place] regarding a prior authorization for [place]. Authorization has been APPROVED from 05/23/2020 to 12/19/2020.   Key: [id number] Authorization # [id number]  Test claim revealed that patient is locked into using [place]

## 2020-06-23 NOTE — Telephone Encounter (Signed)
Submitted Patient Assistance Application to [place] for [place] along with provider portion, signed patient portion, med list, insurance card copy, and income documents. Will update patient when we receive a response.  Fax# [phone] Phone# [phone]  Knox Saliva, PharmD, MPH Clinical Pharmacist (Rheumatology and Pulmonology)

## 2020-06-27 ENCOUNTER — Telehealth: Payer: Self-pay | Admitting: Pulmonary Disease

## 2020-06-27 NOTE — Telephone Encounter (Signed)
Received approval for the Humira pen Patient is approved from May 23, 2020-December 19, 2020.   Nothing further needed

## 2020-06-28 DIAGNOSIS — M5416 Radiculopathy, lumbar region: Secondary | ICD-10-CM | POA: Insufficient documentation

## 2020-06-28 NOTE — Telephone Encounter (Signed)
Received a fax from  [place] regarding an approval for [place] patient assistance from through 01/27/21.   Left VM with patient. We need updated TB gold prior to starting. Future lab order placed today for TB gold.  Will route to [facility]. to help coordinate shipment of medication to clinic for new start visit  Phone number: [phone]  Knox Saliva, PharmD, MPH Clinical Pharmacist (Rheumatology and Pulmonology)

## 2020-06-28 NOTE — Telephone Encounter (Signed)
Pt returning a phone call. Pt can be reached at [phone].

## 2020-06-29 ENCOUNTER — Other Ambulatory Visit (HOSPITAL_COMMUNITY): Payer: Self-pay

## 2020-06-29 NOTE — Telephone Encounter (Signed)
Per Misty at [facility], pharmacy is still processing the prescription and requires 24 hours from time of approval before shipment coordination can be arranged. Rep stated that she would put a note on the order stating that it will be delivered to clinic rather than patient, and to reach out to [place] when ready for delivery. Will reschedule for f/u.

## 2020-06-30 ENCOUNTER — Other Ambulatory Visit: Payer: Medicare Other

## 2020-06-30 DIAGNOSIS — Z79899 Other long term (current) drug therapy: Secondary | ICD-10-CM | POA: Diagnosis not present

## 2020-06-30 NOTE — Telephone Encounter (Signed)
Shipment of Humira is scheduled on Wednesday, 07/05/20 for 2 Humira pens. Will use one pen for first dose in clinic and then provide second pen for patient.  Office must call [employer] to provide consent to ship to patient's home moving forward - we can do this after the new start visit.  Patient still needs updated TB gold. Future order placed. She will plan to stop by clinic to have lab drawn early next week. All other baseline immunosuppressive labs and chest x-ray UTD  Knox Saliva, PharmD, MPH Clinical Pharmacist (Rheumatology and Pulmonology)

## 2020-07-02 LAB — QUANTIFERON-TB GOLD PLUS
Mitogen-NIL: >10 IU/mL
NIL: 0.04 IU/mL
QuantiFERON-TB Gold Plus: NEGATIVE
TB1-NIL: 0.01 IU/mL
TB2-NIL: <0 IU/mL

## 2020-07-03 NOTE — Telephone Encounter (Signed)
Per [employer], Humira is scheduled for shipment on 07/05/20. Medication has not yet shipped so tracking number unavailable today but will be available tomorrow.  TB gold on 06/30/20 was negative. Will need to be checked annually.  Will call patient to schedule Humira new start once med is received by clinic.  Knox Saliva, PharmD, MPH Clinical Pharmacist (Rheumatology and Pulmonology)

## 2020-07-04 NOTE — Telephone Encounter (Signed)
Called [employer] for tracking number. Patient's Humira has shipped and expected delivery is 07/05/20  Tracking number through [employer]: [id number]

## 2020-07-05 NOTE — Telephone Encounter (Signed)
JE please advise:  Destiny Roth, [name] Dear Dr. Loanne Drilling,  As you probably know, I have been approved for Humira. [name], from [facility], called to introduce herself from the Pharmacy at [facility]. She told me she would be helping me with the education I will need to use this prescription. I also received a letter from Dr, Trena Platt office telling me they had to reschedule my appointment with Dr. Manuella Ghazi from June 8th to June 28 because he's going to be out of the office. [name] said she is supposed to receive my Humira today but I have not heard from her. How would you like me to proceed since I won't see him for several more weeks?  With Much appreciation,  Destiny Roth

## 2020-07-05 NOTE — Telephone Encounter (Signed)
(  fyi) Received humira - placed in fridge

## 2020-07-06 NOTE — Telephone Encounter (Addendum)
Patient returned call. She is scheduled for Humira new start on Tuesday, 07/11/20  Knox Saliva, PharmD, MPH Clinical Pharmacist (Rheumatology and Pulmonology)

## 2020-07-06 NOTE — Telephone Encounter (Signed)
Left VM with patient to schedule Humira new start since we received Humira shipment. Left VM requesting return call with my direct office number.  Knox Saliva, PharmD, MPH Clinical Pharmacist (Rheumatology and Pulmonology)

## 2020-07-07 NOTE — Telephone Encounter (Signed)
Patient ready to start Humira. Paperwork has been submitted and pharmacy team has reached out to patient before.

## 2020-07-07 NOTE — Telephone Encounter (Signed)
Humira new start scheduled for 07/11/20.

## 2020-07-07 NOTE — Progress Notes (Signed)
Pharmacy Note  Subjective:   Destiny Roth presents to clinic today with her spouse, [name], to receive first of Humira for posterior uveitis. 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  She was last seen by Dr. Loanne Drilling on 06/09/20 at which time decision was made to pursue Humira. Joint decision between her ophthalmologist, Dr. Manuella Ghazi, and Dr. Loanne Drilling to allow pulmonology to assist in managing Humira.  She was previously taking mycophenolate but was unable to tolerate. Last dose was at least 6 weeks ago. She is curious about Humira side effects and if they are similar to mycophenolate. She is also curious about benefit of Humira in pulmonary involvement of sarcoidosis. Her and her partner inquired about starting Cellcept in the future and staying at a low dose for maintenance to have some benefit rather than none.  Patient running a fever or have signs/symptoms of infection? No  Patient currently on antibiotics for the treatment of infection? No  Patient have any upcoming invasive procedures/surgeries? No  Objective: CMP     Component Value Date/Time   NA 139 06/20/2020 1551   K 4.2 06/20/2020 1551   CL 102 06/20/2020 1551   CO2 27 06/20/2020 1551   GLUCOSE 132 (H) 06/20/2020 1551   BUN 23 06/20/2020 1551   CREATININE 1.45 (H) 06/20/2020 1551   CREATININE 1.28 (H) 07/23/2019 1625   CALCIUM 10.1 06/20/2020 1551   CALCIUM 9.7 02/05/2019 0739   PROT 7.1 06/20/2020 1551   ALBUMIN 4.5 06/20/2020 1551   AST 14 06/20/2020 1551   ALT 7 06/20/2020 1551   ALKPHOS 70 06/20/2020 1551   BILITOT 0.4 06/20/2020 1551   GFRNONAA 37 (L) 03/29/2020 2356   GFRNONAA 43 (L) 04/27/2019 1604   GFRAA 50 (L) 04/27/2019 1604    CBC    Component Value Date/Time   WBC 8.3 06/20/2020 1551   RBC 4.69 06/20/2020 1551   HGB 14.1 06/20/2020 1551   HCT 42.1 06/20/2020 1551    PLT 313.0 06/20/2020 1551   MCV 89.8 06/20/2020 1551   MCH 29.2 03/29/2020 2356   MCHC 33.4 06/20/2020 1551   RDW 13.3 06/20/2020 1551   LYMPHSABS 0.9 06/20/2020 1551   MONOABS 0.4 06/20/2020 1551   EOSABS 0.1 06/20/2020 1551   BASOSABS 0.0 06/20/2020 1551    Baseline Immunosuppressant Therapy Labs TB GOLD Quantiferon TB Gold Latest Ref Rng & Units 06/30/2020  Quantiferon TB Gold Plus NEGATIVE NEGATIVE   Hepatitis Panel Hepatitis Latest Ref Rng & Units 04/27/2019  Hep B Surface Ag NON-REACTI NON-REACTIVE  Hep B IgM NON-REACTI NON-REACTIVE  Hep C Ab NON-REACTI NON-REACTIVE  Hep C Ab NON-REACTI NON-REACTIVE  Hep A IgM NON-REACTI NON-REACTIVE   HIV Lab Results  Component Value Date   HIV NON-REACTIVE 04/27/2019   HIV NON REACTIVE 02/04/2019   SPEP Serum Protein Electrophoresis Latest Ref Rng & Units 06/20/2020  Total Protein 6.0 - 8.3 g/dL 7.1  Albumin 2.9 - 4.4 g/dL -  Alpha-1 0.0 - 0.4 g/dL -  Alpha-2 0.4 - 1.0 g/dL -  Beta Globulin 0.7 - 1.3 g/dL -  Gamma Globulin 0.4 - 1.8 g/dL -  Interpretation - -   Chest x-ray: 03/30/20  no acute cardiopulmonary disease  Assessment/Plan:   We reviewed that literature supporting use of Humira for pulmonary involvement of sarcoidosis is scant and limited and cannot be extrapolated to general population. Advised the Humira will provide benefit for posterior uveitis complication. Also reviewed  that Dr. Loanne Drilling and Dr. Manuella Ghazi agree that tackling vision complication is prioritized to prevent further deficit. We discussed that a re-trial of Cellcept may be warranted in the future if patient is amenable to it after discussion with Dr. Loanne Drilling. Also discussed that Cellcept has pulmonary sarcoidosis benefit that Humira does not. Advised that patients may be on both Cellcept and Humira but d/t her intolerability and risk for additive immunosuppression if both medications are added at the same time, the decision was made to first switch to Humira pending  discussion to re-trial Cellcept. Patient and husband verbalized understanding.  Counseled patient that Humira is a TNF blocking agent.  Counseled patient on purpose, proper use, and adverse effects of Humira.  Reviewed the most common adverse effects including infections, headache, and injection site reactions. Discussed that there is the possibility of an increased risk of malignancy including non-melanoma skin cancer but it is not well understood if this increased risk is due to the medication or the disease state.  Counseled patient that Humira should be held prior to scheduled surgery.  Counseled patient to avoid live vaccines while on Humira.  She has received 3 COVID19 vaccines and is eligible for 4th dose. She is UTD on influenza vaccine. UTD on pneumonia vaccine and zoster vaccine.  Reviewed the importance of regular labs while on Humira therapy.  Will monitor CBC and CMP 1 month after starting and then every 3 months routinely thereafter. Will monitor TB gold annually. Standing orders placed.    Provided patient with medication education material and answered all questions.  Reviewed storage instructions of Humira.   Dose will be  Humira 40mg  subcut every 14 days .   Humira will replace Cellcept.  Demonstrated proper injection technique with Humira demo device  Patient able to demonstrate proper injection technique using the teach back method.  Patient initially self injected in the right thigh with one pen which she lifted from her skin and medication did not administer.  I administered into the left thigh using second pen:  Pharmacy-Supplied Medication: Humira 40mg /0.41mL autoinjector pen x 2 NDC: [address]-78 Lot: [id number] Expiration: 10/2021  Patient tolerated well.  Observed in office for adverse reaction and none noted.   Called [facility] to provide authorization to ship to patient's home and authorization to replace one auto-injector d/t mis-administration - urgent request sent  to pharmacy to fi replacement pen.  Patient is to return in 1 month for labs and 6-8 weeks for follow-up appointment.  Standing orders placed for CBC and CMP. She will need TB gold monitored yearly.  Humira approved through [place] patient assistance program through 01/27/21.  Prescription sent to [facility] today.  All questions encouraged and answered.  Instructed patient to call with any further questions or concerns.  F/u with ophthalmologist scheduled on 07/25/20 and f/u with Dr. Loanne Drilling on 08/14/20. She will need repeat CBC and CMP at f/u with Dr. Loanne Drilling.   Knox Saliva, PharmD, MPH Clinical Pharmacist (Rheumatology and Pulmonology)  07/11/2020 5:19 PM   CC'd Dr. Loanne Drilling and Dr. Manuella Ghazi

## 2020-07-11 ENCOUNTER — Other Ambulatory Visit: Payer: Self-pay

## 2020-07-11 ENCOUNTER — Ambulatory Visit: Payer: Medicare Other | Admitting: Pharmacist

## 2020-07-11 DIAGNOSIS — Z79899 Other long term (current) drug therapy: Secondary | ICD-10-CM

## 2020-07-11 DIAGNOSIS — H3090 Unspecified chorioretinal inflammation, unspecified eye: Secondary | ICD-10-CM

## 2020-07-11 MED ORDER — HUMIRA (2 PEN) 40 MG/0.4ML ~~LOC~~ AJKT: 40.0000 mg | AUTO-INJECTOR | SUBCUTANEOUS | 0 refills | Status: DC

## 2020-07-11 NOTE — Patient Instructions (Addendum)
Your next Humira dose is due on 07/25/20, 08/08/20, and every 14 days (every other week) thereafter  Your prescription will be shipped from [facility]. Their phone number is [phone]  Labs are due in 1 month then every 3 months.  Remember the 5 C's: COUNTER - leave on the counter at least 30 minutes but up to overnight to bring medication to room temperature. This may help prevent stinging COLD - place something cold (like an ice gel pack or cold water bottle) on the injection site just before cleansing with alcohol. This may help reduce pain CLARITIN - use Claritin (generic name is loratadine) for the first two weeks of treatment or the day of, the day before, and the day after injecting. This will help to minimize injection site reactions CORTISONE CREAM - apply if injection site is irritated and itching CALL ME - if injection site reaction is bigger than the size of your fist, looks infected, blisters, or if you develop hives

## 2020-07-13 DIAGNOSIS — M5416 Radiculopathy, lumbar region: Secondary | ICD-10-CM | POA: Diagnosis not present

## 2020-07-14 ENCOUNTER — Telehealth: Payer: Self-pay | Admitting: Pharmacist

## 2020-07-14 NOTE — Telephone Encounter (Signed)
Called [facility] pharmacy to check on status of Humira replacement. Scheduled for delivery on 07/19/20.  Orders for 6 pens (3 boxes) scheduled to arrive to patient's home on 08/03/20 .  Faxing note to Dr. Manuella Ghazi with ophthalmology  Knox Saliva, PharmD, MPH Clinical Pharmacist (Rheumatology and Pulmonology)

## 2020-07-25 DIAGNOSIS — H3581 Retinal edema: Secondary | ICD-10-CM | POA: Diagnosis not present

## 2020-07-25 DIAGNOSIS — H30033 Focal chorioretinal inflammation, peripheral, bilateral: Secondary | ICD-10-CM | POA: Diagnosis not present

## 2020-07-25 DIAGNOSIS — D869 Sarcoidosis, unspecified: Secondary | ICD-10-CM | POA: Diagnosis not present

## 2020-07-25 DIAGNOSIS — H44113 Panuveitis, bilateral: Secondary | ICD-10-CM | POA: Diagnosis not present

## 2020-07-25 DIAGNOSIS — Z79899 Other long term (current) drug therapy: Secondary | ICD-10-CM | POA: Diagnosis not present

## 2020-07-25 DIAGNOSIS — H4710 Unspecified papilledema: Secondary | ICD-10-CM | POA: Diagnosis not present

## 2020-08-02 DIAGNOSIS — Z20822 Contact with and (suspected) exposure to covid-19: Secondary | ICD-10-CM | POA: Diagnosis not present

## 2020-08-03 ENCOUNTER — Other Ambulatory Visit (INDEPENDENT_AMBULATORY_CARE_PROVIDER_SITE_OTHER): Payer: Medicare Other

## 2020-08-03 DIAGNOSIS — Z79899 Other long term (current) drug therapy: Secondary | ICD-10-CM

## 2020-08-03 DIAGNOSIS — M545 Low back pain, unspecified: Secondary | ICD-10-CM | POA: Diagnosis not present

## 2020-08-03 DIAGNOSIS — R2689 Other abnormalities of gait and mobility: Secondary | ICD-10-CM | POA: Diagnosis not present

## 2020-08-04 LAB — CBC WITH DIFFERENTIAL/PLATELET
Basophils Absolute: 0.1 10*3/uL (ref 0.0–0.1)
Basophils Relative: 0.9 % (ref 0.0–3.0)
Eosinophils Absolute: 0.1 10*3/uL (ref 0.0–0.7)
Eosinophils Relative: 1.6 % (ref 0.0–5.0)
HCT: 40.3 % (ref 36.0–46.0)
Hemoglobin: 13.6 g/dL (ref 12.0–15.0)
Lymphocytes Relative: 22.7 % (ref 12.0–46.0)
Lymphs Abs: 1.3 10*3/uL (ref 0.7–4.0)
MCHC: 33.8 g/dL (ref 30.0–36.0)
MCV: 88.7 fl (ref 78.0–100.0)
Monocytes Absolute: 0.5 10*3/uL (ref 0.1–1.0)
Monocytes Relative: 8.2 % (ref 3.0–12.0)
Neutro Abs: 3.9 10*3/uL (ref 1.4–7.7)
Neutrophils Relative %: 66.6 % (ref 43.0–77.0)
Platelets: 327 10*3/uL (ref 150.0–400.0)
RBC: 4.54 Mil/uL (ref 3.87–5.11)
RDW: 12.9 % (ref 11.5–15.5)
WBC: 5.9 10*3/uL (ref 4.0–10.5)

## 2020-08-04 LAB — COMPREHENSIVE METABOLIC PANEL
ALT: 6 U/L (ref 0–35)
AST: 16 U/L (ref 0–37)
Albumin: 4.6 g/dL (ref 3.5–5.2)
Alkaline Phosphatase: 58 U/L (ref 39–117)
BUN: 29 mg/dL — ABNORMAL HIGH (ref 6–23)
CO2: 23 mEq/L (ref 19–32)
Calcium: 9.7 mg/dL (ref 8.4–10.5)
Chloride: 103 mEq/L (ref 96–112)
Creatinine, Ser: 1.48 mg/dL — ABNORMAL HIGH (ref 0.40–1.20)
GFR: 35.32 mL/min — ABNORMAL LOW (ref >60.00)
Glucose, Bld: 106 mg/dL — ABNORMAL HIGH (ref 70–99)
Potassium: 4.2 mEq/L (ref 3.5–5.1)
Sodium: 137 mEq/L (ref 135–145)
Total Bilirubin: 0.4 mg/dL (ref 0.2–1.2)
Total Protein: 7.4 g/dL (ref 6.0–8.3)

## 2020-08-05 ENCOUNTER — Other Ambulatory Visit: Payer: Self-pay | Admitting: Pulmonary Disease

## 2020-08-05 DIAGNOSIS — D869 Sarcoidosis, unspecified: Secondary | ICD-10-CM

## 2020-08-07 DIAGNOSIS — R2689 Other abnormalities of gait and mobility: Secondary | ICD-10-CM | POA: Diagnosis not present

## 2020-08-07 DIAGNOSIS — M545 Low back pain, unspecified: Secondary | ICD-10-CM | POA: Diagnosis not present

## 2020-08-09 ENCOUNTER — Telehealth: Payer: Self-pay | Admitting: Pulmonary Disease

## 2020-08-09 NOTE — Telephone Encounter (Signed)
I called patient. She tested positive for COVID-19 today. She reports developing symptoms yesterday with deep cough, hoarse, malaise, weakness, muscle aches and fatigue. We discussed benefits and risk of currently available antivirals. After discussion she is ok with not taking medications.  Assessment/Plan  Pulmonary and ocular sarcoidosis Immunosuppressed COVID-19 infection  --Continue supportive care with adequate rest, fluids, activity as tolerated --Recommend quarantine per CDC guidelines --She plans to monitor her oxygen levels. Advised that if her SpO2 <88% she will need to seek care in emergency room --If her symptoms worsen but oxygen is normal then she can call our office to consider prednisone taper in setting of sarcoid/obstructive lung disease  Rodman Pickle, M.D. [facility] Pulmonary/Critical Care Medicine 08/09/2020 4:44 PM

## 2020-08-09 NOTE — Telephone Encounter (Signed)
Called and spoke to pt. Pt states she and her husband both tested positive for covid today, 08/09/20. Advised pt for husband to call PCP for recs if needed. Pt c/o hoarseness, weakness, muscle aches, cough with little mucus production, chest congestion, increase in SOB, chest tightness. Pt s/s started 7/12. Pt denies wheezing. Pt afebrile. Pt last seen by Dr. Loanne Drilling on 06/09/20.    Dr. Loanne Drilling, please advise. Thanks.

## 2020-08-14 ENCOUNTER — Ambulatory Visit: Payer: Medicare Other | Admitting: Pulmonary Disease

## 2020-08-25 DIAGNOSIS — R2689 Other abnormalities of gait and mobility: Secondary | ICD-10-CM | POA: Diagnosis not present

## 2020-08-25 DIAGNOSIS — M545 Low back pain, unspecified: Secondary | ICD-10-CM | POA: Diagnosis not present

## 2020-08-29 ENCOUNTER — Other Ambulatory Visit (INDEPENDENT_AMBULATORY_CARE_PROVIDER_SITE_OTHER): Payer: Medicare Other

## 2020-08-29 DIAGNOSIS — M545 Low back pain, unspecified: Secondary | ICD-10-CM | POA: Diagnosis not present

## 2020-08-29 DIAGNOSIS — Z79899 Other long term (current) drug therapy: Secondary | ICD-10-CM

## 2020-08-29 DIAGNOSIS — R2689 Other abnormalities of gait and mobility: Secondary | ICD-10-CM | POA: Diagnosis not present

## 2020-08-30 LAB — CBC WITH DIFFERENTIAL/PLATELET
Basophils Absolute: 0.1 10*3/uL (ref 0.0–0.1)
Basophils Relative: 1 % (ref 0.0–3.0)
Eosinophils Absolute: 0.2 10*3/uL (ref 0.0–0.7)
Eosinophils Relative: 2.4 % (ref 0.0–5.0)
HCT: 37.9 % (ref 36.0–46.0)
Hemoglobin: 12.6 g/dL (ref 12.0–15.0)
Lymphocytes Relative: 17 % (ref 12.0–46.0)
Lymphs Abs: 1.1 10*3/uL (ref 0.7–4.0)
MCHC: 33.2 g/dL (ref 30.0–36.0)
MCV: 89.3 fl (ref 78.0–100.0)
Monocytes Absolute: 0.4 10*3/uL (ref 0.1–1.0)
Monocytes Relative: 6.9 % (ref 3.0–12.0)
Neutro Abs: 4.6 10*3/uL (ref 1.4–7.7)
Neutrophils Relative %: 72.7 % (ref 43.0–77.0)
Platelets: 337 10*3/uL (ref 150.0–400.0)
RBC: 4.25 Mil/uL (ref 3.87–5.11)
RDW: 12.8 % (ref 11.5–15.5)
WBC: 6.4 10*3/uL (ref 4.0–10.5)

## 2020-08-30 LAB — COMPREHENSIVE METABOLIC PANEL
ALT: 8 U/L (ref 0–35)
AST: 17 U/L (ref 0–37)
Albumin: 4 g/dL (ref 3.5–5.2)
Alkaline Phosphatase: 65 U/L (ref 39–117)
BUN: 16 mg/dL (ref 6–23)
CO2: 23 mEq/L (ref 19–32)
Calcium: 9.3 mg/dL (ref 8.4–10.5)
Chloride: 104 mEq/L (ref 96–112)
Creatinine, Ser: 1.15 mg/dL (ref 0.40–1.20)
GFR: 47.79 mL/min — ABNORMAL LOW (ref >60.00)
Glucose, Bld: 98 mg/dL (ref 70–99)
Potassium: 4.1 mEq/L (ref 3.5–5.1)
Sodium: 137 mEq/L (ref 135–145)
Total Bilirubin: 0.3 mg/dL (ref 0.2–1.2)
Total Protein: 7.1 g/dL (ref 6.0–8.3)

## 2020-08-31 DIAGNOSIS — R2689 Other abnormalities of gait and mobility: Secondary | ICD-10-CM | POA: Diagnosis not present

## 2020-08-31 DIAGNOSIS — M545 Low back pain, unspecified: Secondary | ICD-10-CM | POA: Diagnosis not present

## 2020-09-04 DIAGNOSIS — M545 Low back pain, unspecified: Secondary | ICD-10-CM | POA: Diagnosis not present

## 2020-09-04 DIAGNOSIS — R2689 Other abnormalities of gait and mobility: Secondary | ICD-10-CM | POA: Diagnosis not present

## 2020-09-06 DIAGNOSIS — M545 Low back pain, unspecified: Secondary | ICD-10-CM | POA: Diagnosis not present

## 2020-09-06 DIAGNOSIS — R2689 Other abnormalities of gait and mobility: Secondary | ICD-10-CM | POA: Diagnosis not present

## 2020-09-12 ENCOUNTER — Encounter: Payer: Self-pay | Admitting: Pulmonary Disease

## 2020-09-12 ENCOUNTER — Ambulatory Visit (INDEPENDENT_AMBULATORY_CARE_PROVIDER_SITE_OTHER): Payer: Medicare Other | Admitting: Pulmonary Disease

## 2020-09-12 ENCOUNTER — Other Ambulatory Visit: Payer: Self-pay

## 2020-09-12 VITALS — BP 118/66 | HR 91 | Temp 97.7°F | Ht <= 58 in | Wt 126.6 lb

## 2020-09-12 DIAGNOSIS — Z5181 Encounter for therapeutic drug level monitoring: Secondary | ICD-10-CM

## 2020-09-12 DIAGNOSIS — D869 Sarcoidosis, unspecified: Secondary | ICD-10-CM | POA: Diagnosis not present

## 2020-09-12 DIAGNOSIS — R0602 Shortness of breath: Secondary | ICD-10-CM

## 2020-09-12 NOTE — Progress Notes (Signed)
Spirometry/DLCO and Pleth performed today. 

## 2020-09-12 NOTE — Progress Notes (Signed)
  Subjective:   PATIENT ID: Destiny Roth GENDER: female DOB: 10/22/1950, MRN: [id number]   HPI  Chief Complaint  Patient presents with   Follow-up    Sarcoidosis- symptoms are the same shortness of breath and a feeling that she cant get a deep breath.  Covid positive 1 month ago, still have lingering cough     Reason for Visit: Hospital follow-up  Destiny Roth is a 69 year old with Parkinson's disease, HTN, HLD and restrictive lung disease who presents for follow-up for sarcoid management.   Synopsis: Initially seen as an inpatient Pulmonary consult by me on 02/23/19 for hypercalcemia and consideration of bronchoscopy to rule out sarcoid. However due to lack of pulmonary symptoms and normal CT at the time, bronchoscopy thought to be low-yield. Bone biopsy was performed and demonstrated granulomas disease without necrosis. She was started on steroids and discharged with Endocrine and Pulmonary follow-up. Of note, she was previously seen in our Pulmonary clinic in 2018 for dyspnea and mild sleep apnea. Her prior PFTs demonstrate mild restrictive defect with normal DLCO which patient stated was attributed to her Parkinson's disease.  09/23/19 Since our last visit, she has established care with multiple providers including Nephrology and Ophthalmology, the latter who is managing her immunosuppressants. She is currently on tacrolimus 1 mg BID, methotrexate and prednisone. Her prednisone is being weaned. She continues to have fatigue and visual blurriness. Her dyspnea also remains unchanged. Worsens with exertion. Her fatigue seems to be associated with this as well.  11/24/19 She is currently on methotrexate 12.5 mg weekly and on prednisone taper. Tacrolimus was discontinued by ophthalmology due tremors. Overall she is not feeling well with difficulty concentrating, balancing and general weakness. She reports visuo-spatial/depth perception issues and tinnitis (L>R) which she is  going to see a sub-specialist in Neurology. 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 She reports unchanged chest pressure and difficulty with deep breaths. Her breaths have to be shallow. This seems to be occurring more frequently. No wheezing or cough.  02/22/20 She is no longer on methotrexate.  She was started on CellCept in November 2021.  She still continues to have issues with fatigue, tremors and word finding after switching to Cellcept.  She also reports lower extremity aching, no shooting or burning pain. When seen by her neurologist, she reports that her Parkinson's is stable. She was referred to Dr. Basuroski to evaluate for neuro involvement of her sarcoid. She is planned for an MRI of her spine and an EMG to evaluate for neuropathy   04/17/20 Since our last visit, her cellcept was increased. Her ophthalmology note from 03/14/20 by Dr. Shah was reviewed. She reported visual abnormalities however her ocular exam shows stable inflammation so she was advised to continue her current dose of cellcept. She reports worsening shortness of breath and substernal chest pain. Worsens with exertion. Denies wheezing.   05/19/20 She has completed her steroids and feels it made a significant difference in the chest pressure she was feeling but her shortness of breath remains persistent. Her ophthalmology note from 05/16/20 by Dr. Shah was reviewed. Due to reported side effects, she has requested stopping taking immunosuppressants to see if these are the cause of her symptoms of fatigue, memory and tremor issues. Dr. Shah does want to resume immunosuppressants and has offered Humira as the next line in treatment. She reports hesitancy in starting any   other medications at this time.  06/09/20 She reports her vision is the same with floaters. Her fatigue and tremors have improved off the cellcept  however it remains persistent. She will have good days and bad days. Unclear if her memory is better off cellcept. She had questions regarding Humira and if cellcept or methotrexate would be tried again. For her breathing she still has difficulty getting a full breath in and believes this contributes to her fatigue as well.  09/12/20 Since our last visit she was started on Humira on 07/11/20. She is tolerating it well. She states that her symptoms of fatigue have improved off cellcept. On review of Neuro notes, she was started on Amantadine for progressive dyskinesia related to her Parkinsons. She is unsure if the inhaler helps but she is compliant with Incruse daily. She recently had covid-19 one month ago and has recovered with baseline shortness of breath.  Social History: Never smoker   Past Medical History:  Diagnosis Date   Abdominal pain, epigastric 09/18/2009   Anosmia 11/06/2010   ANXIETY 09/23/2006   BUNION, RIGHT FOOT 09/18/2009   CARPAL TUNNEL SYNDROME, BILATERAL 09/23/2006   DEPRESSION 09/23/2006   DIVERTICULOSIS, COLON 09/23/2006   GERD 05/24/2008   GLUCOSE INTOLERANCE 05/24/2008   Hemorrhoids    Hypercalcemia    HYPERLIPIDEMIA 09/23/2006   HYPERTENSION 09/23/2006   IBS 09/23/2006   Impaired glucose tolerance 11/04/2010   OSTEOPOROSIS 05/24/2008   06/01/19-pt states osteopenia   Parkinson's disease (HCC)    Restrictive lung disease 12/20/2013   Sarcoidosis 02/26/2019      Outpatient Medications Prior to Visit  Medication Sig Dispense Refill   acetaminophen (TYLENOL) 500 MG tablet Take 1,000 mg by mouth every 6 (six) hours as needed for mild pain.     Adalimumab (HUMIRA PEN) 40 MG/0.4ML PNKT Inject 40 mg into the skin every 14 (fourteen) days. 6 each 0   amLODipine (NORVASC) 10 MG tablet Take 1 tablet (10 mg total) by mouth daily. 30 tablet 1   aspirin 81 MG tablet Take 81 mg by mouth daily.     carbidopa-levodopa (SINEMET IR) 25-100 MG tablet TAKE ONE TABLET BY MOUTH THREE TIMES  DAILY (Patient taking differently: Take 1 tablet by mouth See admin instructions. Take 1 tablet by mouth four times a day- 8 AM, 12 PM, and between 5-6 PM) 270 tablet 1   Carbidopa-Levodopa ER (SINEMET CR) 25-100 MG tablet controlled release Take 1 tablet by mouth 2 (two) times daily. 60 tablet 0   dicyclomine (BENTYL) 20 MG tablet Take 20 mg by mouth 4 (four) times daily as needed for spasms.     diphenhydrAMINE (BENADRYL) 25 MG tablet Take 25 mg by mouth at bedtime as needed for sleep.     diphenhydramine-acetaminophen (TYLENOL PM) 25-500 MG TABS tablet Take 1 tablet by mouth at bedtime as needed.     gabapentin (NEURONTIN) 100 MG capsule Take 100 mg by mouth 3 (three) times daily. Can take 200mg tid as needed     rOPINIRole (REQUIP) 1 MG tablet Take 1 tablet (1 mg total) by mouth 3 (three) times daily. (Patient taking differently: Take 1 mg by mouth See admin instructions. Take 1 mg by mouth three times a day- 8 AM, 12 PM, and between 5-6 PM) 90 tablet 0   rosuvastatin (CRESTOR) 5 MG tablet Take 5 mg by mouth every evening.      senna (SENOKOT) 8.6 MG TABS tablet Take 1 tablet (8.6 mg total) by mouth 2 (two) times   daily. 30 tablet 0   sertraline (ZOLOFT) 100 MG tablet Take 100 mg by mouth daily.     umeclidinium bromide (INCRUSE ELLIPTA) 62.5 MCG/INH AEPB Inhale 1 puff into the lungs daily. 30 each 6   Amantadine HCl 100 MG tablet Take 1 tablet (100 mg total) by mouth daily.     No facility-administered medications prior to visit.    Review of Systems  Constitutional:  Positive for malaise/fatigue. Negative for chills, diaphoresis, fever and weight loss.  HENT:  Negative for congestion.   Respiratory:  Positive for shortness of breath. Negative for cough, hemoptysis, sputum production and wheezing.   Cardiovascular:  Negative for chest pain, palpitations and leg swelling.  Neurological:        Dyskinesia    Objective:   Vitals:   09/12/20 1451  BP: 118/66  Pulse: 91  Temp: 97.7 F  (36.5 C)  TempSrc: Oral  SpO2: 96%  Weight: 126 lb 9.6 oz (57.4 kg)  Height: 4' 9" (1.448 m)      Physical Exam: General: Well-appearing, no acute distress HENT: [place], AT Eyes: EOMI, no scleral icterus Respiratory: Clear to auscultation bilaterally.  No crackles, wheezing or rales Cardiovascular: RRR, -M/R/G, no JVD Extremities:-Edema,-tenderness Neuro: AAO x4, CNII-XII grossly intact Skin: Intact, no rashes or bruising Psych: Anxious mood, normal affect  Data Reviewed:  Imaging: CXR 02/23/19 - interval resolutions of bilateral pleural effusions CT Chest 02/09/19 - Bilateral pleural effusions. Mild ground glass opacifications bilaterally. No enlarged mediastinal or hilar adenopathy. PET Myocardial 01/07/20 - No evidence of increased uptake PET/CT 01/07/20 1. Bilateral ground-glass pulmonary opacities demonstrate mild-moderate FDG avidity. These findings are nonspecific and may be infectious or inflammatory.2. Mild FDG activity of non-enlarged bilateral hilar lymph nodes is also nonspecific.  This could be reactive in the setting of the pulmonary findings, but activity related to sarcoid cannot be excluded.  CXR 03/30/20 - No infiltrate, effusion or edema.   PFT: 12/17/13 FVC 2.26 (86%) FEV1 1.88 (93%) Ratio 78  TLC 70% DLCO 75% Interpretation: Mild restrictive defect with mild reduction in gas exchange (uncorrected DLCO)  06/22/19 FVC 2.07 (95%) FEV1 1.75 (107%) Ratio 80  TLC 91% DLCO 63% Interpretation: No obstructive or restrictive defect. Mild reduction in gas exchange   06/10/20 FVC 2.11 (99%) FEV1 1.8 (113%) Ratio 82  TLC 132% RV 162% RV/TLC 130% DLCO 94% Interpretation: Normal spirometry and gas exchange. Compared to prior PFTs, patient has had increased lung volumes suggestive of air trapping. No significant bronchodilator response however does not preclude benefit. Clinically correlate  Echocardiogram: 04/22/19 - EF 60-65%. Grade I DD. No WMA or valvular  abnormalities.  CBC CBC Latest Ref Rng & Units 08/29/2020 08/03/2020 06/20/2020  WBC 4.0 - 10.5 K/uL 6.4 5.9 8.3  Hemoglobin 12.0 - 15.0 g/dL 12.6 13.6 14.1  Hematocrit 36.0 - 46.0 % 37.9 40.3 42.1  Platelets 150.0 - 400.0 K/uL 337.0 327.0 313.0     BMP Latest Ref Rng & Units 08/29/2020 08/03/2020 06/20/2020  Glucose 70 - 99 mg/dL 98 106(H) 132(H)  BUN 6 - 23 mg/dL 16 29(H) 23  Creatinine 0.40 - 1.20 mg/dL 1.15 1.48(H) 1.45(H)  BUN/Creat Ratio 6 - 22 (calc) - - -  Sodium 135 - 145 mEq/L 137 137 139  Potassium 3.5 - 5.1 mEq/L 4.1 4.2 4.2  Chloride 96 - 112 mEq/L 104 103 102  CO2 19 - 32 mEq/L 23 23 27  Calcium 8.4 - 10.5 mg/dL 9.3 9.7 10.1  -Improved kidney function. Calcium within normal   limits  Hepatic Function Latest Ref Rng & Units 08/29/2020 08/03/2020 06/20/2020  Total Protein 6.0 - 8.3 g/dL 7.1 7.4 7.1  Albumin 3.5 - 5.2 g/dL 4.0 4.6 4.5  AST 0 - 37 U/L _0 ALT 0 - 35 U/L _1 Alk Phosphatase 39 - 117 U/L 65 58 70  Total Bilirubin 0.2 - 1.2 mg/dL 0.3 0.4 0.4  Bilirubin, Direct 0.0 - 0.3 mg/dL - - 0.1  Normal LFTs  Quantiferon TB Gold Latest Ref Rng & Units 06/30/2020  Quantiferon TB Gold Plus NEGATIVE NEGATIVE    Assessment & Plan:   Discussion: 70 year old female with Parkinson's disease and sarcoid with endocrine, ocular and renal involvement who presents for follow-up.  Bone biopsy on 02/26/2019 confirm diagnosis of sarcoid.  Completed steroid taper in 03/2020 for flareup.  She has been on methotrexate and CellCept however discontinued due to adverse effects including fatigue.  During the course of her medical management and it has been difficult to elicit whether her symptoms are related to progressive Parkinson's, uncontrolled sarcoid and medications.  Currently tolerating Humira.  We discussed potentially restarting CellCept if her sarcoid worsens.  Pulmonary sarcoidosis with posterior uveitis, optic nerve edema and peripheral focal chorioretinal inflammation --Last PFT  05/2020: Normal spirometry --Echo reviewed from 03/2019 with grade I DD. EKG 03/2020 with unchanged prolonged PR interval   --Sarcoid PET - negative for cardiac involvement. Suggests pulmonary involvement --Reviewed Ophthalmology note from 07/25/20 and discussed care with Dr. Manuella Ghazi at [facility] risk medication management --We have reviewed her immunosuppression agents which require serial monitoring for changes in hematologic, hepatic and renal function which can potentially be life threatening.  --Started Humira on 07/11/20 --Reviewed CBC and CMP 1 month after initiation. Will monitor q 3 months now. Will monitor TB gold annually.  Shortness of breath, multifactorial: ?underlying obstruction 2/2 sarcoid, deconditioning - unchanged, persistent PFT 05/2020 overall normal with some evidence of airtrapping however this is nonspecific in absence of obstruction. 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 Johnney Ou. Will CC note and labs. --Will need to resume monthly labs if restarted on Cellcept  Parkinson's disease  Peripheral neuropathy --Followed by [facility] Neurology: Dr. Lezlie Octave. Will CC physician to note per patient request --Previously seen by Dr. [name] at [facility]. MRI neg for neurosarcoid   Health Maintenance Immunization History  Administered Date(s) Administered   Influenza Split 12/06/2019   Influenza, High Dose Seasonal PF 11/30/2015, 11/16/2016, 10/29/2017, 10/30/2018, 12/09/2018   Influenza, Seasonal, Injecte, Preservative Fre 10/20/2012   Influenza,inj,Quad PF,6+ Mos 11/18/2013   Influenza-Unspecified 11/18/2013, 11/30/2014, 10/29/2017, 10/29/2018   Moderna Sars-Covid-2 Vaccination 03/12/2019, 04/09/2019, 12/06/2019    Pneumococcal Conjugate-13 01/01/2013, 01/05/2014   Pneumococcal Polysaccharide-23 01/01/2013, 01/05/2014, 02/25/2020   Tdap 11/06/2010   Zoster Recombinat (Shingrix) 01/10/2018, 03/31/2018   Zoster, Live 11/06/2010, 01/15/2018, 03/20/2018   CT Lung Screen - not qualified  Orders Placed This Encounter  Procedures   CBC w/Diff    Standing Status:   Standing    Number of Occurrences:   6    Standing Expiration Date:   09/12/2021   Comp Met (CMET)    Standing Status:   Standing    Number of Occurrences:   6    Standing Expiration Date:   09/12/2021   Pulmonary  function test    Standing Status:   Future    Number of Occurrences:   1    Standing Expiration Date:   09/12/2021    Scheduling Instructions:     No bronchodilators      Just spirometry      DLCO     And lung volumes    Order Specific Question:   Where should this test be performed?    Answer:   [facility]    Order Specific Question:   Diffusion capacity (DLCO)    Answer:   Yes    Order Specific Question:   Lung volumes    Answer:   Yes   No orders of the defined types were placed in this encounter.   Return in about 3 months (around 12/13/2020).  I have spent a total time of 50-minutes on the day of the appointment reviewing prior documentation, coordinating care and discussing medical diagnosis and plan with the patient/family and her ophthalmologist at [facility]. Past medical history, allergies, medications were reviewed. Pertinent imaging, labs and tests included in this note have been reviewed and interpreted independently by me.   Jane , MD [facility] 09/12/2020 11:33 AM  Office Number [phone]   

## 2020-09-12 NOTE — Patient Instructions (Addendum)
Pulmonary sarcoidosis with posterior uveitis --Sarcoid PET - negative for cardiac involvement. Suggests pulmonary involvement --Reviewed Ophthalmology note from 07/25/20 and discussed care with Dr. Manuella Ghazi at [facility] risk medication management --We have reviewed her immunosuppression agents which require serial monitoring for changes in hematologic, hepatic and renal function which can potentially be life threatening.  --Started Humira on 07/11/20 --Reviewed CBC and CMP 1 month after initiation. Will monitor q 3 months now. Will monitor TB gold annually.  Shortness of breath, multifactorial: ?underlying obstruction 2/2 sarcoid, deconditioning - unchanged, persistent PFT 05/2020 overall normal with some evidence of airtrapping however this is nonspecific in absence of obstruction. --Pulmonary function test today --CONTINUE Incruse ONE puff dail

## 2020-09-12 NOTE — Patient Instructions (Signed)
Spirometry/DLCO and Pleth performed today. 

## 2020-09-13 ENCOUNTER — Telehealth: Payer: Self-pay | Admitting: Pulmonary Disease

## 2020-09-13 DIAGNOSIS — R2689 Other abnormalities of gait and mobility: Secondary | ICD-10-CM | POA: Diagnosis not present

## 2020-09-13 DIAGNOSIS — M545 Low back pain, unspecified: Secondary | ICD-10-CM | POA: Diagnosis not present

## 2020-09-13 LAB — PULMONARY FUNCTION TEST
DL/VA % pred: 84 %
DL/VA: 3.68 ml/min/mmHg/L
DLCO cor % pred: 90 %
DLCO cor: 13.49 ml/min/mmHg
DLCO unc % pred: 88 %
DLCO unc: 13.15 ml/min/mmHg
FEF 25-75 Pre: 2.11 L/sec
FEF2575-%Pred-Pre: 144 %
FEV1-%Pred-Pre: 117 %
FEV1-Pre: 1.87 L
FEV1FVC-%Pred-Pre: 113 %
FEV6-%Pred-Pre: 107 %
FEV6-Pre: 2.17 L
FEV6FVC-%Pred-Pre: 105 %
FVC-%Pred-Pre: 102 %
FVC-Pre: 2.17 L
Pre FEV1/FVC ratio: 86 %
Pre FEV6/FVC Ratio: 100 %
RV % pred: 98 %
RV: 1.81 L
TLC % pred: 105 %
TLC: 4.19 L

## 2020-09-13 NOTE — Telephone Encounter (Signed)
Attempted to contact patient via telephone x 2 for PFT results. No answer. Left message identifying [facility] for callback.  Pulmonary function tests demonstrate normal findings with no evidence of air trapping. I recommend continuing your Incruse as prescribed.

## 2020-09-13 NOTE — Telephone Encounter (Signed)
I have called and LM on VM for the pt to call back for results.

## 2020-09-13 NOTE — Telephone Encounter (Signed)
Please see my telephone note from 09/13/20. If she has any additional questions, she can send a message on mychart.

## 2020-09-13 NOTE — Telephone Encounter (Signed)
Hello Dr. Loanne Drilling, please advise on mychart message below. Thanks!  Dr. Loanne Drilling, I am so sorry I missed both of your calls this morning. Last evening I forgot to turn my phone back on after my appointment with you. Please accept my apologies. Please try me again at your convenience. I will keep my phone by my side today.   Thank you for your understanding. [name]

## 2020-09-14 NOTE — Telephone Encounter (Signed)
Pt is returning call & can be reached at [phone]

## 2020-09-14 NOTE — Telephone Encounter (Signed)
I have called the pt and she is aware of PFT results per JE.  She will continue her incruse.  Nothing further is needed.

## 2020-09-15 DIAGNOSIS — R2689 Other abnormalities of gait and mobility: Secondary | ICD-10-CM | POA: Diagnosis not present

## 2020-09-15 DIAGNOSIS — M545 Low back pain, unspecified: Secondary | ICD-10-CM | POA: Diagnosis not present

## 2020-09-18 DIAGNOSIS — R2689 Other abnormalities of gait and mobility: Secondary | ICD-10-CM | POA: Diagnosis not present

## 2020-09-18 DIAGNOSIS — M545 Low back pain, unspecified: Secondary | ICD-10-CM | POA: Diagnosis not present

## 2020-09-20 DIAGNOSIS — M545 Low back pain, unspecified: Secondary | ICD-10-CM | POA: Diagnosis not present

## 2020-09-20 DIAGNOSIS — R2689 Other abnormalities of gait and mobility: Secondary | ICD-10-CM | POA: Diagnosis not present

## 2020-09-22 ENCOUNTER — Ambulatory Visit: Payer: Medicare Other | Admitting: Internal Medicine

## 2020-09-25 DIAGNOSIS — M5416 Radiculopathy, lumbar region: Secondary | ICD-10-CM | POA: Diagnosis not present

## 2020-10-02 NOTE — Progress Notes (Signed)
Cardiology Office Note   Date:  10/04/2020   ID:  Destiny Roth [birth year], MRN [id number]  PCP:  Leeroy Cha, MD  Cardiologist:   Dorris Carnes, MD       History of Present Illness: Destiny Roth is a 70 y.o. female with a history of Parkinsons, HTN, HL, sarcoid and restrictive lung dz   Followed in Pulmonary     I saw the pt in 2015 for tightnes, SOB   Myoview and echo OK   Continued to have symtpoms   Went ahead and had Rand L heart cath    PA pressure  39/18  PCWP 14   L heart cath with LAD  30 ot 40%; LCx normal  RCA 10%   Vigorous LVEF     Since seen the pt has been followed in pulmonary clinic   Hx of restrictive lung disease Eventurlaly diagnosed with sarcoid.   Also has a hx of Parkinson's   Followed in neuro     The patient presents today for follow  CTs have shown atherosclerosis    SHe continues to have SOB  and chest prssur with activity    Has been going on for years  Has atrrib to sarcoid in past    Says when she walks will get tightness   Have to slow  down and relax  The pt also says she is dizzy a lot  Denies syncope      Pt had a stress echo at [facility] in 2019   Normal       The pt also had a PET scan done at [facility] (2021)   Negative for cardiac involvement     Current Meds  Medication Sig   acetaminophen (TYLENOL) 500 MG tablet Take 1,000 mg by mouth every 6 (six) hours as needed for mild pain.   Adalimumab (HUMIRA PEN) 40 MG/0.4ML PNKT Inject 40 mg into the skin every 14 (fourteen) days.   Amantadine HCl 100 MG tablet Take 1 tablet (100 mg total) by mouth daily.   amLODipine (NORVASC) 5 MG tablet Take 1 tablet (5 mg total) by mouth daily.   aspirin 81 MG tablet Take 81 mg by mouth daily.   carbidopa-levodopa (SINEMET IR) 25-100 MG tablet TAKE ONE TABLET BY MOUTH THREE TIMES DAILY (Patient taking differently: Take 1 tablet by mouth See admin instructions. Take 1 tablet by mouth four times a day- 8 AM, 12 PM, and between 5-6 PM)    dicyclomine (BENTYL) 20 MG tablet Take 20 mg by mouth 4 (four) times daily as needed for spasms.   diphenhydrAMINE (BENADRYL) 25 MG tablet Take 25 mg by mouth at bedtime as needed for sleep.   diphenhydramine-acetaminophen (TYLENOL PM) 25-500 MG TABS tablet Take 1 tablet by mouth at bedtime as needed.   gabapentin (NEURONTIN) 100 MG capsule Take 100 mg by mouth as directed.   rOPINIRole (REQUIP) 1 MG tablet Take 1 tablet (1 mg total) by mouth 3 (three) times daily. (Patient taking differently: Take 1 mg by mouth See admin instructions. Take 1 mg by mouth three times a day- 8 AM, 12 PM, and between 5-6 PM)   rosuvastatin (CRESTOR) 10 MG tablet Take 1 tablet (10 mg total) by mouth daily.   senna (SENOKOT) 8.6 MG TABS tablet Take 1 tablet (8.6 mg total) by mouth 2 (two) times daily.   sertraline (ZOLOFT) 100 MG tablet Take 100 mg by mouth daily.   umeclidinium bromide (INCRUSE ELLIPTA) 62.5 MCG/INH AEPB  Inhale 1 puff into the lungs daily.   [DISCONTINUED] amLODipine (NORVASC) 10 MG tablet Take 1 tablet (10 mg total) by mouth daily.   [DISCONTINUED] Carbidopa-Levodopa ER (SINEMET CR) 25-100 MG tablet controlled release Take 1 tablet by mouth 2 (two) times daily.   [DISCONTINUED] rosuvastatin (CRESTOR) 5 MG tablet Take 5 mg by mouth every evening.      Allergies:   Myrbetriq [mirabegron], Atorvastatin, and Zocor [simvastatin]   Past Medical History:  Diagnosis Date   Abdominal pain, epigastric 09/18/2009   Anosmia 11/06/2010   ANXIETY 09/23/2006   BUNION, RIGHT FOOT 09/18/2009   CARPAL TUNNEL SYNDROME, BILATERAL 09/23/2006   DEPRESSION 09/23/2006   DIVERTICULOSIS, COLON 09/23/2006   GERD 05/24/2008   GLUCOSE INTOLERANCE 05/24/2008   Hemorrhoids    Hypercalcemia    HYPERLIPIDEMIA 09/23/2006   HYPERTENSION 09/23/2006   IBS 09/23/2006   Impaired glucose tolerance 11/04/2010   OSTEOPOROSIS 05/24/2008   06/01/19-pt states osteopenia   Parkinson's disease ([place])    Restrictive lung disease 12/20/2013    Sarcoidosis 02/26/2019    Past Surgical History:  Procedure Laterality Date   CARDIAC CATHETERIZATION     CATARACT EXTRACTION     COLONOSCOPY  2008   COLONOSCOPY W/ BIOPSIES  2008   Dr. Collene Mares -negative random bxs   ESOPHAGOGASTRODUODENOSCOPY  2008   Dr. Harriette Bouillon duodenal ulcer   FOOT SURGERY Bilateral 2015   LEFT AND RIGHT HEART CATHETERIZATION WITH CORONARY ANGIOGRAM N/A 11/09/2013   Procedure: LEFT AND RIGHT HEART CATHETERIZATION WITH CORONARY ANGIOGRAM;  Surgeon: Peter M Martinique, MD;  Location: [facility] CATH LAB;  Service: Cardiovascular;  Laterality: N/A;   nasal septum repair     s/p bilat CTS     s/p fibroid ablation     s/p ganglion cyst right wrist     s/p right thumb trigger finger surgury     SHOULDER SURGERY Right 2015   TUBAL LIGATION     UPPER GASTROINTESTINAL ENDOSCOPY       Social History:  The patient  reports that she has never smoked. She has been exposed to tobacco smoke. She has never used smokeless tobacco. She reports that she does not currently use alcohol. She reports that she does not use drugs.   Family History:  The patient's family history includes Heart attack in her brother and mother; Heart disease (age of onset: 67) in her father; Hypertension in her brother, brother, brother, and father; Peripheral vascular disease in her mother.    ROS:  Please see the history of present illness. All other systems are reviewed and  Negative to the above problem except as noted.    PHYSICAL EXAM: VS:  BP 120/72   Pulse 81   Ht '4\' 9"'$  (1.448 m)   Wt 57.2 kg   SpO2 97%   BMI 27.31 kg/m    Orthostatics:    Laying  124/68  P 64  Sitting   124/68  P 74   Stanidn g  118/70  P 83   Standing 4 min  120/64  P 84   GEN: Well nourished, well developed, in no acute distress  HEENT: normal  Neck: no JVD, carotid bruits, or masses Cardiac: RRR; no murmurs, No LE edema  Respiratory:  clear to auscultation bilaterally, normal work of breathing GI: soft, nontender,  nondistended, + BS  No hepatomegaly  MS: no deformity Moving all extremities   Skin: warm and dry, no rash Neuro:  Strength and sensation are intact Psych: euthymic mood, full affect  EKG:  EKG is ordered today.  PET scan 12/2019    FINAL COMMENTS   1- Cardiac PET/CT Metabolic Study:   No Evidence of Increased F-18 FDG in The Myocardium To Suggest Inflamation. No Evidence of Active Inflamatory Process   Suuc As Active Sarcoid or Myocarditis.    2- Cardiac PET/CT Myocardial Perfusion Imaging Study Demonstrate No Evidence of Perfusion Abnormalities.    3- Gated Cardiac PET/CT Functional Study Demonstrate Normal Left Ventricular Ejection Fraction at Rest as Well as Normal Left   Ventricular Volumes.    3- Gated Cardiac PET/CT Functional Study Demonstrate Normal Left Ventricular Ejection Fraction at Rest as Well as Normal Left   Ventricular Volumes.    4- Gated Cardiac PET/CT Functional Study Demonstrate Normal Left Ventricular Function and Wall Thickening.    5- No Evidence of Increased Tracer Uptake in The Right Ventricular Free Wall.   Echo  07/16/17   RESTING ECHOCARDIOGRAPHIC MEASUREMENTS----------------------------------------    2D DIMENSIONS   AORTA          Values  Normal Range      Aorta Sin   2.7 cm  [2.7 - 3.3]   LEFT VENTRICLE          LVIDd   3.7 cm  [3.9 - 5.3]          LVIDs   3.1 cm             FS  16%      [> 25%]            SWT   1.2 cm  [0.6 - 1.0]            PWT   1.1 cm  [0.6 - 1.0]   LEFT ATRIUM        LA Diam   2.8 cm  [2.7 - 3.8]   RESTING ECHOCARDIOGRAPHIC DESCRIPTIONS ---------------------------------------    LEFT VENTRICLE           Size: Normal    Contraction: Normal                     LV Masses: No Masses            LVH: MILD LVH CONCENTRIC     Dias.Class: Normal    RIGHT VENTRICLE           Size: Normal                     Free Wall: Normal    Contraction: Normal                     RV Masses: No Masses    PERICARDIUM           Fluid: No effusion   RESTING DOPPLER ECHO and OTHER SPECIAL PROCEDURES ----------------------------      Aortic: No AR                  No AS      Mitral: TRIVIAL MR             No MS   Tricuspid: TRIVIAL TR             No TS   Pulmonary: TRIVIAL PR             No PS      Other:             DEFINITY CONTRAST SHOWS ENHANCED LV BORDERS   STRESS ECHOCARDIOGRAPHY ------------------------------------------------------  Protocol: Treadmill Darnell Level)              Drugs: None  Target Heart Rate: 129  Maximum Predicted Heart Rate: 152        Resting ECG: Normal; Sinus rhythm   TYPE     STAGE    TIME    HR    BP    RPE  SPO2 COMMENTS  -------- ----- --------- --- ------- ----- ---- ------------------------------  Baseline                 101 132/ 98  BaseLine   1             107 132/ 98            Definity 53m given IV push  BaseLine   2             104 124/ 66  Stress     1    180 sec. 130 154/ 76  7/20  Stress     2    180 sec. 144 175/ 75 12/20  Stress     3     20 sec. 144    /               Definity 232mgiven IV push  Stress     4     10 sec. 144    /  Recovery   1      1 min. 136 193/ 72  Recovery   2      2 min. 122    /  Recovery   3      4 min. 110 162/ 74  Recovery   4      6 min. 107 162/ 74  Recovery   5      8 min. 106 152/ 75  Recovery   6     10 min. 105 136/ 79     Stress Duration: 6.50 minutes.  Max Stress H.R: 144   Target Heart Rate (129) Achieved: Yes    Max. workload of  8.90  METs was achieved during exercise.        HR Response: Appropriate        BP Response: Normal resting BP - appropriate response   WALL SEGMENT FINDINGS --------------------------------------------------------                          Rest            Stress                         --------------- ---------------  Anterior Septum Basal: Normal          Hyperkinetic                    Mid: Normal          Hyperkinetic          Apical Septum: Normal          Hyperkinetic      Anterior Wall Basal: Normal          Hyperkinetic                    Mid: Normal          Hyperkinetic                 Apical: Normal  Hyperkinetic      Lateral Wall Basal: Normal          Hyperkinetic                    Mid: Normal          Hyperkinetic                 Apical: Normal          Hyperkinetic    Posterior Wall Basal: Normal          Hyperkinetic                    Mid: Normal          Hyperkinetic     Inferior Wall Basal: Normal          Hyperkinetic                    Mid: Normal          Hyperkinetic                 Apical: Normal          Hyperkinetic   Inferior Septum Basal: Normal          Hyperkinetic                    Mid: Normal          Hyperkinetic                      EF: >55%            >55%    ADDITIONAL FINDINGS ----------------------------------------------------------     Chest Discomfort: None          Arrhythmia: None  Termination Reason: Fatigue       Complications: None   STRESS ECG RESULTS -----------------------------------------------------------    ECG Results: Normal. .   INTERPRETATION ---------------------------------------------------------------    NORMAL STRESS TEST. NORMAL RESTING STUDY WITH NO WALL MOTION ABNORMALITIES   AT REST AND PEAK STRESS.   NORMAL LA PRESSURES WITH NORMAL DIASTOLIC FUNCTION   VALVULAR REGURGITATION: TRIVIAL MR, TRIVIAL PR, TRIVIAL TR   NO VALVULAR STENOSIS   Maximum workload of  8.90 METs was achieved during exercise.   NORMAL RESTING BP - APPROPRIATE RESPONSE   Lipid Panel    Component Value Date/Time   CHOL 106 02/06/2019 0920   TRIG 240 (H) 02/06/2019 0920   HDL <10 (L) 02/06/2019 0920   CHOLHDL NOT CALCULATED 02/06/2019 0920   VLDL 48 (H) 02/06/2019 0920   LDLCALC NOT CALCULATED 02/06/2019 0920   LDLDIRECT 104.0 07/07/2014 1225      Wt Readings from Last 3 Encounters:  10/04/20 57.2 kg  09/12/20 57.4 kg  06/09/20 56.2 kg      ASSESSMENT AND PLAN:  1  Chest tightnss/ SOB     Patient's symptoms may indeed be related to sarcoid   She has CAD   Mild at cath     Also, she does show some signs of autonomic dysfunciton which could also exacerbate/cause symtpoms if her BP is tenuous  Recomm:    I would set up for a stress echo   She had one at [facility] in 2019  Can compare times for walknng   Follow BP  Would cut amlodipine down to 5   FOllow BP  2   Dizziness   Pulse incrased 20 pts with standing  Would cut back on amlodiopne   Follow   Stay hydratedd  3   Hx CAD   Stress echo     4   HL  Increase crestor to 10 mg for tighter BP control  F/U in 2 months    Current medicines are reviewed at length with the patient today.  The patient does not have concerns regarding medicines.  Signed, Dorris Carnes, MD  10/04/2020 10:51 PM    [facility] [place], [place], [place]  [address] Phone: [phone]; Fax: [phone]

## 2020-10-03 ENCOUNTER — Other Ambulatory Visit: Payer: Self-pay

## 2020-10-03 DIAGNOSIS — H3090 Unspecified chorioretinal inflammation, unspecified eye: Secondary | ICD-10-CM

## 2020-10-03 MED ORDER — HUMIRA (2 PEN) 40 MG/0.4ML ~~LOC~~ AJKT: 40.0000 mg | AUTO-INJECTOR | SUBCUTANEOUS | 0 refills | Status: DC

## 2020-10-04 ENCOUNTER — Ambulatory Visit (INDEPENDENT_AMBULATORY_CARE_PROVIDER_SITE_OTHER): Payer: Medicare Other | Admitting: Internal Medicine

## 2020-10-04 ENCOUNTER — Encounter: Payer: Self-pay | Admitting: Internal Medicine

## 2020-10-04 ENCOUNTER — Other Ambulatory Visit: Payer: Self-pay

## 2020-10-04 VITALS — BP 120/72 | HR 81 | Ht <= 58 in | Wt 126.2 lb

## 2020-10-04 DIAGNOSIS — R42 Dizziness and giddiness: Secondary | ICD-10-CM | POA: Diagnosis not present

## 2020-10-04 DIAGNOSIS — R0789 Other chest pain: Secondary | ICD-10-CM

## 2020-10-04 DIAGNOSIS — I2510 Atherosclerotic heart disease of native coronary artery without angina pectoris: Secondary | ICD-10-CM

## 2020-10-04 DIAGNOSIS — I10 Essential (primary) hypertension: Secondary | ICD-10-CM

## 2020-10-04 MED ORDER — AMLODIPINE BESYLATE 5 MG PO TABS: 5.0000 mg | ORAL_TABLET | Freq: Every day | ORAL | 3 refills | Status: DC

## 2020-10-04 MED ORDER — ROSUVASTATIN CALCIUM 10 MG PO TABS: 10.0000 mg | ORAL_TABLET | Freq: Every day | ORAL | 3 refills | Status: DC

## 2020-10-04 NOTE — Patient Instructions (Addendum)
Medication Instructions:  Your physician has recommended you make the following change in your medication:  1.) increase rosuvastatin (Crestor) to 10 mg daily 2.) decrease amlodipine to 5 mg (HALF TABLET) daily  *If you need a refill on your cardiac medications before your next appointment, please call your pharmacy*   Lab Work: none If you have labs (blood work) drawn today and your tests are completely normal, you will receive your results only by: [place] (if you have MyChart) OR A paper copy in the mail If you have any lab test that is abnormal or we need to change your treatment, we will call you to review the results.   Testing/Procedures: Your physician has requested that you have a stress echocardiogram. For further information please visit [email or web address]. Please follow instruction sheet as given.    Follow-Up: At [facility], you and your health needs are our priority.  As part of our continuing mission to provide you with exceptional heart care, we have created designated Provider Care Teams.  These Care Teams include your primary Cardiologist (physician) and Advanced Practice Providers (APPs -  Physician Assistants and Nurse Practitioners) who all work together to provide you with the care you need, when you need it.   Your next appointment:   2 month(s)---office will call you with your next appointment  The format for your next appointment:   In Person  Provider:   You may see Dr. Dorris Carnes or one of the following Advanced Practice Providers on your designated Care Team:   Richardson Dopp, PA-C Robbie Lis, [name]   Other Instructions

## 2020-10-10 DIAGNOSIS — D869 Sarcoidosis, unspecified: Secondary | ICD-10-CM | POA: Diagnosis not present

## 2020-10-10 DIAGNOSIS — N1832 Chronic kidney disease, stage 3b: Secondary | ICD-10-CM | POA: Diagnosis not present

## 2020-10-10 DIAGNOSIS — I129 Hypertensive chronic kidney disease with stage 1 through stage 4 chronic kidney disease, or unspecified chronic kidney disease: Secondary | ICD-10-CM | POA: Diagnosis not present

## 2020-10-11 ENCOUNTER — Telehealth: Payer: Self-pay | Admitting: *Deleted

## 2020-10-11 DIAGNOSIS — R0789 Other chest pain: Secondary | ICD-10-CM

## 2020-10-11 DIAGNOSIS — I2510 Atherosclerotic heart disease of native coronary artery without angina pectoris: Secondary | ICD-10-CM

## 2020-10-16 DIAGNOSIS — G4700 Insomnia, unspecified: Secondary | ICD-10-CM | POA: Diagnosis not present

## 2020-10-16 DIAGNOSIS — I10 Essential (primary) hypertension: Secondary | ICD-10-CM | POA: Diagnosis not present

## 2020-10-16 DIAGNOSIS — E785 Hyperlipidemia, unspecified: Secondary | ICD-10-CM | POA: Diagnosis not present

## 2020-10-16 DIAGNOSIS — F3341 Major depressive disorder, recurrent, in partial remission: Secondary | ICD-10-CM | POA: Diagnosis not present

## 2020-10-16 DIAGNOSIS — G20 Parkinson's disease: Secondary | ICD-10-CM | POA: Diagnosis not present

## 2020-10-19 ENCOUNTER — Telehealth (HOSPITAL_COMMUNITY): Payer: Self-pay | Admitting: *Deleted

## 2020-10-19 NOTE — Telephone Encounter (Signed)
Patient given detailed instructions per Myocardial Perfusion Study Information Sheet for the test on 10/25/20  Patient notified to arrive 15 minutes early and that it is imperative to arrive on time for appointment to keep from having the test rescheduled.  If you need to cancel or reschedule your appointment, please call the office within 24 hours of your appointment. . Patient verbalized understanding. Kirstie Peri

## 2020-10-25 ENCOUNTER — Other Ambulatory Visit: Payer: Self-pay

## 2020-10-25 ENCOUNTER — Ambulatory Visit (HOSPITAL_COMMUNITY): Payer: Medicare Other | Attending: Cardiology

## 2020-10-25 ENCOUNTER — Encounter (HOSPITAL_COMMUNITY): Payer: Medicare Other

## 2020-10-25 DIAGNOSIS — R42 Dizziness and giddiness: Secondary | ICD-10-CM | POA: Diagnosis not present

## 2020-10-25 DIAGNOSIS — R0789 Other chest pain: Secondary | ICD-10-CM | POA: Diagnosis not present

## 2020-10-25 DIAGNOSIS — I2510 Atherosclerotic heart disease of native coronary artery without angina pectoris: Secondary | ICD-10-CM | POA: Insufficient documentation

## 2020-10-25 DIAGNOSIS — I10 Essential (primary) hypertension: Secondary | ICD-10-CM | POA: Diagnosis not present

## 2020-10-25 MED ORDER — PERFLUTREN LIPID MICROSPHERE
1.0000 mL | INTRAVENOUS | Status: AC | PRN
  Administered 2020-10-25: 5 mL via INTRAVENOUS

## 2020-10-25 NOTE — Telephone Encounter (Signed)
ENCOUNTER FOR ATTESTATION ORDER.

## 2020-10-31 ENCOUNTER — Other Ambulatory Visit (HOSPITAL_COMMUNITY): Payer: Medicare Other

## 2020-10-31 DIAGNOSIS — N1832 Chronic kidney disease, stage 3b: Secondary | ICD-10-CM | POA: Diagnosis not present

## 2020-10-31 DIAGNOSIS — E785 Hyperlipidemia, unspecified: Secondary | ICD-10-CM | POA: Diagnosis not present

## 2020-10-31 DIAGNOSIS — Z8616 Personal history of COVID-19: Secondary | ICD-10-CM | POA: Diagnosis not present

## 2020-10-31 DIAGNOSIS — R2681 Unsteadiness on feet: Secondary | ICD-10-CM | POA: Diagnosis not present

## 2020-10-31 DIAGNOSIS — Z23 Encounter for immunization: Secondary | ICD-10-CM | POA: Diagnosis not present

## 2020-10-31 DIAGNOSIS — D869 Sarcoidosis, unspecified: Secondary | ICD-10-CM | POA: Diagnosis not present

## 2020-10-31 DIAGNOSIS — I10 Essential (primary) hypertension: Secondary | ICD-10-CM | POA: Diagnosis not present

## 2020-11-09 DIAGNOSIS — G629 Polyneuropathy, unspecified: Secondary | ICD-10-CM | POA: Diagnosis not present

## 2020-11-09 DIAGNOSIS — R202 Paresthesia of skin: Secondary | ICD-10-CM | POA: Diagnosis not present

## 2020-11-09 DIAGNOSIS — Z7982 Long term (current) use of aspirin: Secondary | ICD-10-CM | POA: Diagnosis not present

## 2020-11-09 DIAGNOSIS — D869 Sarcoidosis, unspecified: Secondary | ICD-10-CM | POA: Diagnosis not present

## 2020-11-09 DIAGNOSIS — G6289 Other specified polyneuropathies: Secondary | ICD-10-CM | POA: Diagnosis not present

## 2020-11-15 DIAGNOSIS — N1832 Chronic kidney disease, stage 3b: Secondary | ICD-10-CM | POA: Diagnosis not present

## 2020-11-20 DIAGNOSIS — G6289 Other specified polyneuropathies: Secondary | ICD-10-CM | POA: Insufficient documentation

## 2020-11-20 DIAGNOSIS — G629 Polyneuropathy, unspecified: Secondary | ICD-10-CM | POA: Insufficient documentation

## 2020-11-21 DIAGNOSIS — G20 Parkinson's disease: Secondary | ICD-10-CM | POA: Diagnosis not present

## 2020-11-21 DIAGNOSIS — E785 Hyperlipidemia, unspecified: Secondary | ICD-10-CM | POA: Diagnosis not present

## 2020-11-21 DIAGNOSIS — F3341 Major depressive disorder, recurrent, in partial remission: Secondary | ICD-10-CM | POA: Diagnosis not present

## 2020-11-21 DIAGNOSIS — I10 Essential (primary) hypertension: Secondary | ICD-10-CM | POA: Diagnosis not present

## 2020-11-21 DIAGNOSIS — G4700 Insomnia, unspecified: Secondary | ICD-10-CM | POA: Diagnosis not present

## 2020-12-14 ENCOUNTER — Other Ambulatory Visit (INDEPENDENT_AMBULATORY_CARE_PROVIDER_SITE_OTHER): Payer: Medicare Other

## 2020-12-14 DIAGNOSIS — Z5181 Encounter for therapeutic drug level monitoring: Secondary | ICD-10-CM

## 2020-12-14 DIAGNOSIS — G629 Polyneuropathy, unspecified: Secondary | ICD-10-CM | POA: Diagnosis not present

## 2020-12-14 DIAGNOSIS — M792 Neuralgia and neuritis, unspecified: Secondary | ICD-10-CM | POA: Diagnosis not present

## 2020-12-14 DIAGNOSIS — D869 Sarcoidosis, unspecified: Secondary | ICD-10-CM | POA: Diagnosis not present

## 2020-12-14 LAB — COMPREHENSIVE METABOLIC PANEL
ALT: 5 U/L (ref 0–35)
AST: 18 U/L (ref 0–37)
Albumin: 4.7 g/dL (ref 3.5–5.2)
Alkaline Phosphatase: 66 U/L (ref 39–117)
BUN: 29 mg/dL — ABNORMAL HIGH (ref 6–23)
CO2: 27 mEq/L (ref 19–32)
Calcium: 10 mg/dL (ref 8.4–10.5)
Chloride: 101 mEq/L (ref 96–112)
Creatinine, Ser: 1.42 mg/dL — ABNORMAL HIGH (ref 0.40–1.20)
GFR: 37.03 mL/min — ABNORMAL LOW (ref >60.00)
Glucose, Bld: 101 mg/dL — ABNORMAL HIGH (ref 70–99)
Potassium: 4.1 mEq/L (ref 3.5–5.1)
Sodium: 137 mEq/L (ref 135–145)
Total Bilirubin: 0.5 mg/dL (ref 0.2–1.2)
Total Protein: 7.9 g/dL (ref 6.0–8.3)

## 2020-12-14 LAB — CBC WITH DIFFERENTIAL/PLATELET
Basophils Absolute: 0 10*3/uL (ref 0.0–0.1)
Basophils Relative: 0.5 % (ref 0.0–3.0)
Eosinophils Absolute: 0.1 10*3/uL (ref 0.0–0.7)
Eosinophils Relative: 1.2 % (ref 0.0–5.0)
HCT: 44.4 % (ref 36.0–46.0)
Hemoglobin: 14.7 g/dL (ref 12.0–15.0)
Lymphocytes Relative: 16.5 % (ref 12.0–46.0)
Lymphs Abs: 1.1 10*3/uL (ref 0.7–4.0)
MCHC: 33 g/dL (ref 30.0–36.0)
MCV: 90.7 fl (ref 78.0–100.0)
Monocytes Absolute: 0.5 10*3/uL (ref 0.1–1.0)
Monocytes Relative: 7.3 % (ref 3.0–12.0)
Neutro Abs: 4.9 10*3/uL (ref 1.4–7.7)
Neutrophils Relative %: 74.5 % (ref 43.0–77.0)
Platelets: 288 10*3/uL (ref 150.0–400.0)
RBC: 4.9 Mil/uL (ref 3.87–5.11)
RDW: 13.1 % (ref 11.5–15.5)
WBC: 6.5 10*3/uL (ref 4.0–10.5)

## 2020-12-14 NOTE — Progress Notes (Signed)
Destiny Roth,  I have reviewed your labs. Your blood counts, calcium and liver enzymes are within normal range. Your kidney function has fluctuated to be slightly higher but these values seem similar to four months ago. I will send results to Dr. Johnney Ou.   For any questions or concerns, please feel free to message me. I look forward to seeing you in clinic in December.  Sincerely, Dr. Loanne Drilling

## 2020-12-28 ENCOUNTER — Encounter: Payer: Self-pay | Admitting: Pulmonary Disease

## 2020-12-28 ENCOUNTER — Other Ambulatory Visit: Payer: Self-pay

## 2020-12-28 ENCOUNTER — Telehealth: Payer: Self-pay | Admitting: Pharmacist

## 2020-12-28 ENCOUNTER — Ambulatory Visit (INDEPENDENT_AMBULATORY_CARE_PROVIDER_SITE_OTHER): Payer: Medicare Other | Admitting: Pulmonary Disease

## 2020-12-28 VITALS — BP 114/70 | HR 82 | Temp 97.6°F | Ht <= 58 in | Wt 130.4 lb

## 2020-12-28 DIAGNOSIS — G20 Parkinson's disease: Secondary | ICD-10-CM | POA: Diagnosis not present

## 2020-12-28 DIAGNOSIS — G629 Polyneuropathy, unspecified: Secondary | ICD-10-CM | POA: Diagnosis not present

## 2020-12-28 DIAGNOSIS — D869 Sarcoidosis, unspecified: Secondary | ICD-10-CM | POA: Diagnosis not present

## 2020-12-28 DIAGNOSIS — H3090 Unspecified chorioretinal inflammation, unspecified eye: Secondary | ICD-10-CM | POA: Diagnosis not present

## 2020-12-28 DIAGNOSIS — Z5181 Encounter for therapeutic drug level monitoring: Secondary | ICD-10-CM | POA: Diagnosis not present

## 2020-12-28 DIAGNOSIS — Z79899 Other long term (current) drug therapy: Secondary | ICD-10-CM

## 2020-12-28 MED ORDER — HUMIRA (2 PEN) 40 MG/0.4ML ~~LOC~~ AJKT: 40.0000 mg | AUTO-INJECTOR | SUBCUTANEOUS | 1 refills | Status: DC

## 2020-12-28 MED ORDER — PREDNISONE 10 MG PO TABS: ORAL_TABLET | ORAL | 0 refills | Status: DC

## 2020-12-28 NOTE — Progress Notes (Signed)
Subjective:   PATIENT ID: Destiny Roth: female DOB: 25-Oct-1950, MRN: [id number]   HPI  Chief Complaint  Patient presents with   Follow-up    Sarcoid     Reason for Visit: Follow-up  Destiny Roth is a 70 year old with Parkinson's disease, HTN, HLD and restrictive lung disease who presents for follow-up for sarcoid management.   Synopsis: Initially seen as an inpatient Pulmonary consult by me on 02/23/19 for hypercalcemia and consideration of bronchoscopy to rule out sarcoid. However due to lack of pulmonary symptoms and normal CT at the time, bronchoscopy thought to be low-yield. Bone biopsy was performed and demonstrated granulomas disease without necrosis. She was started on steroids and discharged with Endocrine and Pulmonary follow-up. Of note, she was previously seen in our Pulmonary clinic in 2018 for dyspnea and mild sleep apnea. Her prior PFTs demonstrate mild restrictive defect with normal DLCO which patient stated was attributed to her Parkinson's disease.  09/23/19 Since our last visit, she has established care with multiple providers including Nephrology and Ophthalmology, the latter who is managing her immunosuppressants. She is currently on tacrolimus 1 mg BID, methotrexate and prednisone. Her prednisone is being weaned. She continues to have fatigue and visual blurriness. Her dyspnea also remains unchanged. Worsens with exertion. Her fatigue seems to be associated with this as well.  11/24/19 She is currently on methotrexate 12.5 mg weekly and on prednisone taper. Tacrolimus was discontinued by ophthalmology due tremors. Overall she is not feeling well with difficulty concentrating, balancing and general weakness. She reports visuo-spatial/depth perception issues and tinnitis (L>R) which she is going to see a sub-specialist in Neurology. 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 She reports unchanged chest pressure and difficulty with deep breaths. Her breaths have to be shallow. This seems to be occurring more frequently. No wheezing or cough.  02/22/20 She is no longer on methotrexate.  She was started on CellCept in November 2021.  She still continues to have issues with fatigue, tremors and word finding after switching to Cellcept.  She also reports lower extremity aching, no shooting or burning pain. When seen by her neurologist, she reports that her Parkinson's is stable. She was referred to Dr. Harriette Ohara to evaluate for neuro involvement of her sarcoid. She is planned for an MRI of her spine and an EMG to evaluate for neuropathy   04/17/20 Since our last visit, her cellcept was increased. Her ophthalmology note from 03/14/20 by Dr. Manuella Ghazi was reviewed. She reported visual abnormalities however her ocular exam shows stable inflammation so she was advised to continue her current dose of cellcept. She reports worsening shortness of breath and substernal chest pain. Worsens with exertion. Denies wheezing.   05/19/20 She has completed her steroids and feels it made a significant difference in the chest pressure she was feeling but her shortness of breath remains persistent. Her ophthalmology note from 05/16/20 by Dr. Manuella Ghazi was reviewed. Due to reported side effects, she has requested stopping taking immunosuppressants to see if these are the cause of her symptoms of fatigue, memory and tremor issues. Dr. Manuella Ghazi does want to resume immunosuppressants and has offered Humira as the next line in treatment. She reports hesitancy in starting any other medications at this time.  06/09/20 She reports her vision is the same with floaters. Her fatigue and tremors have improved off the cellcept however it remains  persistent. She will have good days and bad days. Unclear if her memory is better off cellcept. She had questions regarding Humira and if cellcept  or methotrexate would be tried again. For her breathing she still has difficulty getting a full breath in and believes this contributes to her fatigue as well.  09/12/20 Since our last visit she was started on Humira on 07/11/20. She is tolerating it well. She states that her symptoms of fatigue have improved off cellcept. On review of Neuro notes, she was started on Amantadine for progressive dyskinesia related to her Parkinsons. She is unsure if the inhaler helps but she is compliant with Incruse daily. She recently had covid-19 one month ago and has recovered with baseline shortness of breath.  12/28/20 Since our last visit, she is compliant with her Humira. She is reports that she is still having some chest tightness and some difficulty breathing all the way. She was seen by Neurology and gabapentin increased for newly diagnosed small fiber neuropathy. She reports that her vision is still distorted and occurs in the day but worsens at night. Denies double vision or floaters.  Social History: Never smoker   Past Medical History:  Diagnosis Date   Abdominal pain, epigastric 09/18/2009   Anosmia 11/06/2010   ANXIETY 09/23/2006   BUNION, RIGHT FOOT 09/18/2009   CARPAL TUNNEL SYNDROME, BILATERAL 09/23/2006   DEPRESSION 09/23/2006   DIVERTICULOSIS, COLON 09/23/2006   GERD 05/24/2008   GLUCOSE INTOLERANCE 05/24/2008   Hemorrhoids    Hypercalcemia    HYPERLIPIDEMIA 09/23/2006   HYPERTENSION 09/23/2006   IBS 09/23/2006   Impaired glucose tolerance 11/04/2010   OSTEOPOROSIS 05/24/2008   06/01/19-pt states osteopenia   Parkinson's disease ([place])    Restrictive lung disease 12/20/2013   Sarcoidosis 02/26/2019      Outpatient Medications Prior to Visit  Medication Sig Dispense Refill   acetaminophen (TYLENOL) 500 MG tablet Take 1,000 mg by mouth every 6 (six) hours as needed for mild pain.     Adalimumab (HUMIRA PEN) 40 MG/0.4ML PNKT Inject 40 mg into the skin every 14 (fourteen) days. 6 each 0    Amantadine HCl 100 MG tablet Take 1 tablet (100 mg total) by mouth daily.     amLODipine (NORVASC) 5 MG tablet Take 1 tablet (5 mg total) by mouth daily. 90 tablet 3   aspirin 81 MG tablet Take 81 mg by mouth daily.     carbidopa-levodopa (SINEMET IR) 25-100 MG tablet TAKE ONE TABLET BY MOUTH THREE TIMES DAILY (Patient taking differently: Take 1 tablet by mouth See admin instructions. Take 1 tablet by mouth four times a day- 8 AM, 12 PM, and between 5-6 PM) 270 tablet 1   dicyclomine (BENTYL) 20 MG tablet Take 20 mg by mouth 4 (four) times daily as needed for spasms.     diphenhydrAMINE (BENADRYL) 25 MG tablet Take 25 mg by mouth at bedtime as needed for sleep.     diphenhydramine-acetaminophen (TYLENOL PM) 25-500 MG TABS tablet Take 1 tablet by mouth at bedtime as needed.     gabapentin (NEURONTIN) 100 MG capsule Take 100 mg by mouth as directed.     rOPINIRole (REQUIP) 1 MG tablet Take 1 tablet (1 mg total) by mouth 3 (three) times daily. (Patient taking differently: Take 1 mg by mouth See admin instructions. Take 1 mg by mouth three times a day- 8 AM, 12 PM, and between 5-6 PM) 90 tablet 0   rosuvastatin (CRESTOR) 10 MG tablet Take 1 tablet (10  mg total) by mouth daily. 90 tablet 3   senna (SENOKOT) 8.6 MG TABS tablet Take 1 tablet (8.6 mg total) by mouth 2 (two) times daily. 30 tablet 0   sertraline (ZOLOFT) 100 MG tablet Take 100 mg by mouth daily.     umeclidinium bromide (INCRUSE ELLIPTA) 62.5 MCG/INH AEPB Inhale 1 puff into the lungs daily. 30 each 6   No facility-administered medications prior to visit.    Review of Systems  Constitutional:  Negative for chills, diaphoresis, fever, malaise/fatigue and weight loss.  HENT:  Negative for congestion.   Eyes:  Negative for double vision, photophobia, pain, discharge and redness.       Visual changes  Respiratory:  Negative for cough, hemoptysis, sputum production, shortness of breath and wheezing.   Cardiovascular:  Negative for chest  pain, palpitations and leg swelling.    Objective:   Vitals:   12/28/20 1334  Pulse: 82  Temp: 97.6 F (36.4 C)  TempSrc: Oral  SpO2: 98%  Weight: 130 lb 6.4 oz (59.1 kg)  Height: _0  (1.473 m)   SpO2: 98 %  Physical Exam: General: Well-appearing, no acute distress HENT: [place], AT Eyes: EOMI, no scleral icterus Respiratory: Clear to auscultation bilaterally.  No crackles, wheezing or rales Cardiovascular: RRR, -M/R/G, no JVD Extremities:-Edema,-tenderness Neuro: AAO x4, CNII-XII grossly intact Psych: Normal mood, normal affect  Data Reviewed:  Imaging: CXR 02/23/19 - interval resolutions of bilateral pleural effusions CT Chest 02/09/19 - Bilateral pleural effusions. Mild ground glass opacifications bilaterally. No enlarged mediastinal or hilar adenopathy. PET Myocardial 01/07/20 - No evidence of increased uptake PET/CT 01/07/20 1. Bilateral ground-glass pulmonary opacities demonstrate mild-moderate FDG avidity. These findings are nonspecific and may be infectious or inflammatory.2. Mild FDG activity of non-enlarged bilateral hilar lymph nodes is also nonspecific.  This could be reactive in the setting of the pulmonary findings, but activity related to sarcoid cannot be excluded.  CXR 03/30/20 - No infiltrate, effusion or edema.   PFT: 12/17/13 FVC 2.26 (86%) FEV1 1.88 (93%) Ratio 78  TLC 70% DLCO 75% Interpretation: Mild restrictive defect with mild reduction in gas exchange (uncorrected DLCO)  06/22/19 FVC 2.07 (95%) FEV1 1.75 (107%) Ratio 80  TLC 91% DLCO 63% Interpretation: No obstructive or restrictive defect. Mild reduction in gas exchange   06/10/20 FVC 2.11 (99%) FEV1 1.8 (113%) Ratio 82  TLC 132% RV 162% RV/TLC 130% DLCO 94% Interpretation: Normal spirometry and gas exchange. Compared to prior PFTs, patient has had increased lung volumes suggestive of air trapping. No significant bronchodilator response however does not preclude benefit. Clinically  correlate  09/12/20 FVC 2.17 (102%) FEV1 1.87 (117%) Ratio 86  TLC 105% DLCO 88% Interpretation: Normal PFTs. Air trapping has resolved.  Echocardiogram: 04/22/19 - EF 60-65%. Grade I DD. No WMA or valvular abnormalities.  CBC CBC Latest Ref Rng & Units 12/14/2020 08/29/2020 08/03/2020  WBC 4.0 - 10.5 K/uL 6.5 6.4 5.9  Hemoglobin 12.0 - 15.0 g/dL 14.7 12.6 13.6  Hematocrit 36.0 - 46.0 % 44.4 37.9 40.3  Platelets 150.0 - 400.0 K/uL 288.0 337.0 327.0   BMP Latest Ref Rng & Units 12/14/2020 08/29/2020 08/03/2020  Glucose 70 - 99 mg/dL 101(H) 98 106(H)  BUN 6 - 23 mg/dL 29(H) 16 29(H)  Creatinine 0.40 - 1.20 mg/dL 1.42(H) 1.15 1.48(H)  BUN/Creat Ratio 6 - 22 (calc) - - -  Sodium 135 - 145 mEq/L 137 137 137  Potassium 3.5 - 5.1 mEq/L 4.1 4.1 4.2  Chloride 96 - 112 mEq/L 101  104 103  CO2 19 - 32 mEq/L _0 Calcium 8.4 - 10.5 mg/dL 10.0 9.3 9.7  Calcium is wnl. Cr incrased compared to prior however similar to earlier this year  Hepatic Function Latest Ref Rng & Units 12/14/2020 08/29/2020 08/03/2020  Total Protein 6.0 - 8.3 g/dL 7.9 7.1 7.4  Albumin 3.5 - 5.2 g/dL 4.7 4.0 4.6  AST 0 - 37 U/L _1 ALT 0 - 35 U/L _2 Alk Phosphatase 39 - 117 U/L 66 65 58  Total Bilirubin 0.2 - 1.2 mg/dL 0.5 0.3 0.4  Bilirubin, Direct 0.0 - 0.3 mg/dL - - -  Normal LFTs  Quantiferon TB Gold Latest Ref Rng & Units 06/30/2020  Quantiferon TB Gold Plus NEGATIVE NEGATIVE    Assessment & Plan:   Discussion: 70 year old female with Parkinson's disease and sarcoid with endocrine, ocular and renal involvement who presents for follow-up.  Completed steroid taper in 03/2020 for flareup.  She has been on methotrexate and CellCept however discontinued due to adverse effects including fatigue.  During the course of her medical management and it has been difficult to elicit whether her symptoms are related to progressive Parkinson's, uncontrolled sarcoid and medications.  Currently tolerating Humira.  We discussed  potentially restarting CellCept if her sarcoid worsens.  70 year old female with sarcoid with endocrine, ocular, renal and bone involvement and Parkinson's disease who presents for follow-up.  Sarcoid with pulm, ocular, renal and bone involvement --Dx 02/26/19 vis bone biopsy at [facility] --Last PFT 08/2020: Normal spirometry --Echo reviewed from 03/2019 with grade I DD. EKG 03/2020 with unchanged prolonged PR interval   --Sarcoid PET - negative for cardiac involvement. Suggests pulmonary involvement --Will re-evaluate and plan to order sarcoid PET in Feb 2023 after prednisone taper --Trend CBC and CMP q 3 months. Will monitor TB gold annually.  High risk medication management --Completed prednisone taper 03/2020 --Started methotrexate 04/2020 --Started cellcept 07/2020. Methotrexate concurrently weaned d/t intolerance --Prednisone taper started with cellcept discontinuation d/t intolerance 04/2020 --Immunosuppressant holiday  --Started Humira on 07/11/20 for persistent ocular symptoms --01/03/21 START prednisone 40 mg x 3 weeks. 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 Patient planning for follow-up --Immunosuppressants as above --Reviewed Neuro notes 12/28/20 - visual distortions may be related to Parkinson's  Shortness of breath, multifactorial: ?underlying obstruction 2/2 sarcoid, deconditioning - unchanged, persistent PFT 08/2020  normal. Air trapping resolved --CONTINUE Incruse ONE puff daily. 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 Johnney Ou. Will CC note and labs. --Will need to resume monthly labs if restarted on Cellcept  Small fiber  neuropathy secondary to sarcoid Parkinson's disease  --MRI Brain 03/2019 neg for neurosarcoid --Spine MRI 02/2020 neg for spinal sarcoid  --Followed by [facility]: Dr. Lezlie Octave for Parkinson's  --Followed [facility] Dr[facility] [name]. On gabapentin  Health Maintenance Immunization History  Administered Date(s) Administered   Influenza Split 12/06/2019   Influenza, High Dose Seasonal PF 11/30/2015, 11/16/2016, 10/29/2017, 10/30/2018, 12/09/2018, 10/31/2020   Influenza, Seasonal, Injecte, Preservative Fre 10/20/2012   Influenza,inj,Quad PF,6+ Mos 11/18/2013   Influenza-Unspecified 11/18/2013, 11/30/2014, 10/29/2017, 10/29/2018   Moderna Sars-Covid-2 Vaccination 03/12/2019, 04/09/2019, 12/06/2019,  05/18/2020   Pneumococcal Conjugate-13 01/01/2013, 01/05/2014   Pneumococcal Polysaccharide-23 01/01/2013, 01/05/2014, 02/25/2020   Tdap 11/06/2010   Zoster Recombinat (Shingrix) 01/10/2018, 03/31/2018   Zoster, Live 11/06/2010, 01/15/2018, 03/20/2018   CT Lung Screen - not qualified  No orders of the defined types were placed in this encounter.  Meds ordered this encounter  Medications   predniSONE (DELTASONE) 10 MG tablet    Sig: Take 4 tablets (40 mg total) by mouth daily with breakfast for 21 days, THEN 3 tablets (30 mg total) daily with breakfast for 7 days, THEN 2 tablets (20 mg total) daily with breakfast for 7 days, THEN 1 tablet (10 mg total) daily with breakfast for 7 days.    Dispense:  20 tablet    Refill:  0   Adalimumab (HUMIRA PEN) 40 MG/0.4ML PNKT    Sig: Inject 40 mg into the skin every 14 (fourteen) days.    Dispense:  6 each    Refill:  1     Return in about 7 weeks (around 02/14/2021).  I have spent a total time of 40-minutes on the day of the appointment reviewing prior documentation, coordinating care and discussing medical diagnosis and plan with the patient/family. Past medical history, allergies, medications were reviewed. Pertinent  imaging, labs and tests included in this note have been reviewed and interpreted independently by me.   [place], MD [facility] 12/28/2020 1:40 PM  Office Number [phone]

## 2020-12-28 NOTE — Telephone Encounter (Signed)
Patient had OV with Dr. Loanne Drilling today. She is enrolled into [employer] through 01/27/21. She stated she recalled answering three questions in the summer but not recently.  I called [employer] [facility]. They confirmed that patient is enrolled into [employer] [facility] to receive Humira at no cost through 01/27/2022.  Called patient - left VM advising of above and that she does not need to return papers to clinic. Will f/u  Knox Saliva, PharmD, MPH, BCPS Clinical Pharmacist (Rheumatology and Pulmonology)

## 2020-12-28 NOTE — Patient Instructions (Addendum)
Pulmonary sarcoidosis with posterior uveitis, optic nerve edema and peripheral focal chorioretinal inflammation --START prednisone 40 mg x 3 weeks. Decrease by 10 mg every week for total 6 weeks --Will re-evaluate and plan to order sarcoid PET in Feb 2023  Shortness of breath, multifactorial: ?underlying obstruction 2/2 sarcoid, deconditioning - unchanged, persistent PFT 05/2020 overall normal with some evidence of airtrapping however this is nonspecific in absence of obstruction. --CONTINUE Incruse ONE puff daily. Trial 1-2 weeks off to see if there is any benefit  I will review available data on Prevagen and send MyChart message you including effectiveness and any side effects of the med  Follow-up with me in mid-January 2023

## 2020-12-28 NOTE — Telephone Encounter (Signed)
Faxed 2023 approval letter received and sent to scan center. Patient is eligible through 01/27/2022.

## 2021-01-01 ENCOUNTER — Other Ambulatory Visit: Payer: Self-pay | Admitting: Pulmonary Disease

## 2021-01-03 ENCOUNTER — Encounter: Payer: Self-pay | Admitting: Pulmonary Disease

## 2021-01-03 DIAGNOSIS — H3090 Unspecified chorioretinal inflammation, unspecified eye: Secondary | ICD-10-CM

## 2021-01-03 DIAGNOSIS — D869 Sarcoidosis, unspecified: Secondary | ICD-10-CM

## 2021-01-03 MED ORDER — PREDNISONE 10 MG PO TABS: ORAL_TABLET | ORAL | 0 refills | Status: AC

## 2021-01-03 MED ORDER — HUMIRA (2 PEN) 40 MG/0.4ML ~~LOC~~ AJKT: 40.0000 mg | AUTO-INJECTOR | SUBCUTANEOUS | 0 refills | Status: DC

## 2021-01-03 NOTE — Telephone Encounter (Signed)
Refill for Humira sent to [facility]. Notified patient via MyChart  Knox Saliva, PharmD, MPH, BCPS Clinical Pharmacist (Rheumatology and Pulmonology)

## 2021-01-03 NOTE — Telephone Encounter (Signed)
Received the following email from patient in regards to her Humira:   "Also. I called [employer] on Monday and was told they had not received the prescription for my next box of Humira that we discussed at my last appointment. Would you please send them that request?    As always, thank you for your help.   Destiny Roth"  I checked her chart and it looks like the Humira was sent to the local [facility] by Dr. Loanne Drilling on 12/28/20. Does this prescription need to be cancelled at the local [facility] and sent to [facility] directly?

## 2021-01-04 NOTE — Addendum Note (Signed)
Addended by: Rodman Pickle on: 01/04/2021 04:39 PM   Modules accepted: Orders

## 2021-01-06 ENCOUNTER — Encounter: Payer: Self-pay | Admitting: Pulmonary Disease

## 2021-01-13 ENCOUNTER — Other Ambulatory Visit: Payer: Self-pay | Admitting: Pulmonary Disease

## 2021-01-16 DIAGNOSIS — H4710 Unspecified papilledema: Secondary | ICD-10-CM | POA: Diagnosis not present

## 2021-01-16 DIAGNOSIS — D869 Sarcoidosis, unspecified: Secondary | ICD-10-CM | POA: Diagnosis not present

## 2021-01-16 DIAGNOSIS — N1832 Chronic kidney disease, stage 3b: Secondary | ICD-10-CM | POA: Diagnosis not present

## 2021-01-16 DIAGNOSIS — Z79899 Other long term (current) drug therapy: Secondary | ICD-10-CM | POA: Diagnosis not present

## 2021-01-16 DIAGNOSIS — H30033 Focal chorioretinal inflammation, peripheral, bilateral: Secondary | ICD-10-CM | POA: Diagnosis not present

## 2021-01-16 DIAGNOSIS — H3581 Retinal edema: Secondary | ICD-10-CM | POA: Diagnosis not present

## 2021-01-16 DIAGNOSIS — H44113 Panuveitis, bilateral: Secondary | ICD-10-CM | POA: Diagnosis not present

## 2021-01-17 NOTE — Telephone Encounter (Signed)
Refill 1 inhaler. Reassess if patient needs inhaler at next visit in January 2023.

## 2021-01-24 NOTE — Progress Notes (Deleted)
Cardiology Office Note    Date:  01/24/2021   ID:  Destiny, Roth 05-19-1950, MRN [id number]   PCP:  Roth Cha, Destiny  Cardiologist:  None *** Advanced Practice Provider:  No care team member to display Electrophysiologist:  None   [id number]}   No chief complaint on file.   History of Present Illness:  Destiny Roth is a 70 y.o. female with a history of Parkinsons, HTN, HL, sarcoid and restrictive lung dz followed in Pulmonary. 2015 chest tightnes, SOB  Myoview and echo OK   Continued to have symtpoms   Went ahead and had Rand L heart cath    PA pressure  39/18  PCWP 14   L heart cath with LAD  30 ot 40%; LCx normal  RCA 10%   Vigorous LVEF     Since seen the pt has been followed in pulmonary clinic   Hx of restrictive lung disease Eventurlaly diagnosed with sarcoid.   Also has a hx of Parkinson's   Followed in neuro.  Patient saw Dr. Harrington Roth 10/04/20 with continued exertional chest pain and DOE as well as dizziness. Stress echo was normal.      Past Medical History:  Diagnosis Date   Abdominal pain, epigastric 09/18/2009   Anosmia 11/06/2010   ANXIETY 09/23/2006   BUNION, RIGHT FOOT 09/18/2009   CARPAL TUNNEL SYNDROME, BILATERAL 09/23/2006   DEPRESSION 09/23/2006   DIVERTICULOSIS, COLON 09/23/2006   GERD 05/24/2008   GLUCOSE INTOLERANCE 05/24/2008   Hemorrhoids    Hypercalcemia    HYPERLIPIDEMIA 09/23/2006   HYPERTENSION 09/23/2006   IBS 09/23/2006   Impaired glucose tolerance 11/04/2010   OSTEOPOROSIS 05/24/2008   06/01/19-pt states osteopenia   Parkinson's disease ([place])    Restrictive lung disease 12/20/2013   Sarcoidosis 02/26/2019    Past Surgical History:  Procedure Laterality Date   CARDIAC CATHETERIZATION     CATARACT EXTRACTION     COLONOSCOPY  2008   COLONOSCOPY W/ BIOPSIES  2008   Dr. Collene Mares -negative random bxs   ESOPHAGOGASTRODUODENOSCOPY  2008   Dr. Harriette Bouillon duodenal ulcer   FOOT SURGERY Bilateral  2015   LEFT AND RIGHT HEART CATHETERIZATION WITH CORONARY ANGIOGRAM N/A 11/09/2013   Procedure: LEFT AND RIGHT HEART CATHETERIZATION WITH CORONARY ANGIOGRAM;  Surgeon: Peter M Martinique, MD;  Location: [facility] CATH LAB;  Service: Cardiovascular;  Laterality: N/A;   nasal septum repair     s/p bilat CTS     s/p fibroid ablation     s/p ganglion cyst right wrist     s/p right thumb trigger finger surgury     SHOULDER SURGERY Right 2015   TUBAL LIGATION     UPPER GASTROINTESTINAL ENDOSCOPY      Current Medications: No outpatient medications have been marked as taking for the 01/31/21 encounter (Appointment) with Destiny Burn, PA-C.     Allergies:   Myrbetriq [mirabegron], Atorvastatin, and Zocor [simvastatin]   Social History   Socioeconomic History   Marital status: Married    Spouse name: Not on file   Number of children: 2   Years of education: Not on file   Highest education level: Not on file  Occupational History   Not on file  Tobacco Use   Smoking status: Never    Passive exposure: Yes   Smokeless tobacco: Never  Vaping Use   Vaping Use: Never used  Substance and Sexual Activity   Alcohol use: Not Currently  Comment: Occ   Drug use: No   Sexual activity: Yes    Partners: Male    Birth control/protection: Post-menopausal  Other Topics Concern   Not on file  Social History Narrative   Not on file   Social Determinants of Health   Financial Resource Strain: Not on file  Food Insecurity: Not on file  Transportation Needs: Not on file  Physical Activity: Not on file  Stress: Not on file  Social Connections: Not on file     Family History:  The patient's ***family history includes Heart attack in her brother and mother; Heart disease (age of onset: 58) in her father; Hypertension in her brother, brother, brother, and father; Peripheral vascular disease in her mother.   ROS:   Please see the history of present illness.    ROS All other systems reviewed and are  negative.   PHYSICAL EXAM:   VS:  There were no vitals taken for this visit.  Physical Exam  GEN: Well nourished, well developed, in no acute distress  HEENT: normal  Neck: no JVD, carotid bruits, or masses Cardiac:RRR; no murmurs, rubs, or gallops  Respiratory:  clear to auscultation bilaterally, normal work of breathing GI: soft, nontender, nondistended, + BS Ext: without cyanosis, clubbing, or edema, Good distal pulses bilaterally MS: no deformity or atrophy  Skin: warm and dry, no rash Neuro:  Alert and Oriented x 3, Strength and sensation are intact Psych: euthymic mood, full affect  Wt Readings from Last 3 Encounters:  12/28/20 130 lb 6.4 oz (59.1 kg)  10/04/20 126 lb 3.2 oz (57.2 kg)  09/12/20 126 lb 9.6 oz (57.4 kg)      Studies/Labs Reviewed:   EKG:  EKG is*** ordered today.  The ekg ordered today demonstrates ***  Recent Labs: 12/14/2020: ALT 5; BUN 29; Creatinine, Ser 1.42; Hemoglobin 14.7; Platelets 288.0; Potassium 4.1; Sodium 137   Lipid Panel    Component Value Date/Time   CHOL 106 02/06/2019 0920   TRIG 240 (H) 02/06/2019 0920   HDL <10 (L) 02/06/2019 0920   CHOLHDL NOT CALCULATED 02/06/2019 0920   VLDL 48 (H) 02/06/2019 0920   LDLCALC NOT CALCULATED 02/06/2019 0920   LDLDIRECT 104.0 07/07/2014 1225    Additional studies/ records that were reviewed today include:  Stress echo 09/2020 IMPRESSIONS     1. Stress echocardiogram with no chest pain, normal BP response, no ST  changes and no stress-induced wall motion abnormalities; normal study.   2. This is a negative stress echocardiogram for ischemia.   3. This is a low risk study.   FINDINGS   Exam Protocol: The patient exercised on a treadmill according to a Bruce  protocol. Definity contrast agent was given IV to delineate the left  ventricular endocardial borders.     Patient Performance: The patient exercised for 6 minutes achieving 7 METS.  The maximum stage achieved was II of the Bruce  protocol. The heart rate at  peak stress was 136 bpm. The target heart rate was calculated to be 126  bpm. The percentage of maximum  predicted heart rate achieved was 91.9 %. The baseline blood pressure was  118/84 mmHg. The blood pressure at peak stress was 145/89 mmHg. The  patient developed fatigue during the stress exam.    EKG: Resting EKG showed normal sinus rhythm. The patient developed no  abnormal EKG findings during exercise.     2D Echo Findings: Baseline regional wall motion abnormalities were not  present. There were  no stress-induced wall motion abnormalities. This is a  negative stress echocardiogram for ischemia.    Additional Findings: Stress echocardiogram with no chest pain, normal BP  response, no ST changes and no stress-induced wall motion abnormalities;  normal study.     Kirk Ruths MD  Electronically signed on 10/25/2020 at 1:53:28 PM      PET scan 12/2019     FINAL COMMENTS   1- Cardiac PET/CT Metabolic Study:   No Evidence of Increased F-18 FDG in The Myocardium To Suggest Inflamation. No Evidence of Active Inflamatory Process   Suuc As Active Sarcoid or Myocarditis.    2- Cardiac PET/CT Myocardial Perfusion Imaging Study Demonstrate No Evidence of Perfusion Abnormalities.    3- Gated Cardiac PET/CT Functional Study Demonstrate Normal Left Ventricular Ejection Fraction at Rest as Well as Normal Left   Ventricular Volumes.    3- Gated Cardiac PET/CT Functional Study Demonstrate Normal Left Ventricular Ejection Fraction at Rest as Well as Normal Left   Ventricular Volumes.    4- Gated Cardiac PET/CT Functional Study Demonstrate Normal Left Ventricular Function and Wall Thickening.    5- No Evidence of Increased Tracer Uptake in The Right Ventricular Free Wall.    Echo  07/16/17    RESTING ECHOCARDIOGRAPHIC MEASUREMENTS----------------------------------------    2D DIMENSIONS   AORTA          Values  Normal Range      Aorta Sin   2.7 cm   [2.7 - 3.3]   LEFT VENTRICLE          LVIDd   3.7 cm  [3.9 - 5.3]          LVIDs   3.1 cm             FS  16%      [> 25%]            SWT   1.2 cm  [0.6 - 1.0]            PWT   1.1 cm  [0.6 - 1.0]   LEFT ATRIUM        LA Diam   2.8 cm  [2.7 - 3.8]   RESTING ECHOCARDIOGRAPHIC DESCRIPTIONS ---------------------------------------    LEFT VENTRICLE           Size: Normal    Contraction: Normal                     LV Masses: No Masses            LVH: MILD LVH CONCENTRIC     Dias.Class: Normal    RIGHT VENTRICLE           Size: Normal                     Free Wall: Normal    Contraction: Normal                     RV Masses: No Masses    PERICARDIUM          Fluid: No effusion   RESTING DOPPLER ECHO and OTHER SPECIAL PROCEDURES ----------------------------      Aortic: No AR                  No AS      Mitral: TRIVIAL MR             No MS   Tricuspid: TRIVIAL TR  No TS   Pulmonary: TRIVIAL PR             No PS      Other:             DEFINITY CONTRAST SHOWS ENHANCED LV BORDERS   STRESS ECHOCARDIOGRAPHY ------------------------------------------------------            Protocol: Treadmill (Bruce)              Drugs: None  Target Heart Rate: 129  Maximum Predicted Heart Rate: 152        Resting ECG: Normal; Sinus rhythm   TYPE     STAGE    TIME    HR    BP    RPE  SPO2 COMMENTS  -------- ----- --------- --- ------- ----- ---- ------------------------------  Baseline                 101 132/ 98  BaseLine   1             107 132/ 98            Definity 29ml given IV push  BaseLine   2             104 124/ 66  Stress     1    180 sec. 130 154/ 76  7/20  Stress     2    180 sec. 144 175/ 75 12/20  Stress     3     20 sec. 144    /               Definity 72ml given IV push  Stress     4     10 sec. 144    /  Recovery   1      1 min. 136 193/ 72  Recovery   2      2 min. 122    /  Recovery   3      4 min. 110 162/ 74  Recovery   4      6 min. 107 162/ 74  Recovery    5      8 min. 106 152/ 75  Recovery   6     10 min. 105 136/ 79     Stress Duration: 6.50 minutes.  Max Stress H.R: 144   Target Heart Rate (129) Achieved: Yes    Max. workload of  8.90  METs was achieved during exercise.        HR Response: Appropriate        BP Response: Normal resting BP - appropriate response   WALL SEGMENT FINDINGS --------------------------------------------------------                          Rest            Stress                         --------------- ---------------  Anterior Septum Basal: Normal          Hyperkinetic                    Mid: Normal          Hyperkinetic          Apical Septum: Normal          Hyperkinetic     Anterior Wall Basal:  Normal          Hyperkinetic                    Mid: Normal          Hyperkinetic                 Apical: Normal          Hyperkinetic      Lateral Wall Basal: Normal          Hyperkinetic                    Mid: Normal          Hyperkinetic                 Apical: Normal          Hyperkinetic    Posterior Wall Basal: Normal          Hyperkinetic                    Mid: Normal          Hyperkinetic     Inferior Wall Basal: Normal          Hyperkinetic                    Mid: Normal          Hyperkinetic                 Apical: Normal          Hyperkinetic   Inferior Septum Basal: Normal          Hyperkinetic                    Mid: Normal          Hyperkinetic                      EF: >55%            >55%    ADDITIONAL FINDINGS ----------------------------------------------------------     Chest Discomfort: None          Arrhythmia: None  Termination Reason: Fatigue       Complications: None   STRESS ECG RESULTS -----------------------------------------------------------    ECG Results: Normal. .   INTERPRETATION ---------------------------------------------------------------    NORMAL STRESS TEST. NORMAL RESTING STUDY WITH NO WALL MOTION ABNORMALITIES   AT REST AND PEAK STRESS.   NORMAL LA  PRESSURES WITH NORMAL DIASTOLIC FUNCTION   VALVULAR REGURGITATION: TRIVIAL MR, TRIVIAL PR, TRIVIAL TR   NO VALVULAR STENOSIS   Maximum workload of  8.90 METs was achieved during exercise.   NORMAL RESTING BP - APPROPRIATE RESPONSE    Risk Assessment/Calculations:   {Does this patient have ATRIAL FIBRILLATION?[phone]}     ASSESSMENT:    No diagnosis found.   PLAN:  In order of problems listed above:  Nonobstructive CAD on cath with ongoing chest pain and DOE-stress echo normal 09/2020  Sarcoid and restrictive lung disease followed by pulmonary  HTN  HLD    Shared Decision Making/Informed Consent   {Are you ordering a CV Procedure (e.g. stress test, cath, DCCV, TEE, etc)?   Press F2        :[id number]}    Medication Adjustments/Labs and Tests Ordered: Current medicines are reviewed at length with the patient today.  Concerns regarding medicines are outlined above.  Medication changes, Labs and Tests  ordered today are listed in the Patient Instructions below. There are no Patient Instructions on file for this visit.   Sumner Boast, PA-C  01/24/2021 9:07 AM    [facility] [address], [place], [place]  [id number] Phone: [phone]; Fax: [phone]

## 2021-01-28 DIAGNOSIS — Z20822 Contact with and (suspected) exposure to covid-19: Secondary | ICD-10-CM | POA: Diagnosis not present

## 2021-01-31 ENCOUNTER — Ambulatory Visit: Payer: Medicare Other | Admitting: Physician Assistant

## 2021-02-02 DIAGNOSIS — Z20822 Contact with and (suspected) exposure to covid-19: Secondary | ICD-10-CM | POA: Diagnosis not present

## 2021-02-14 ENCOUNTER — Ambulatory Visit: Payer: Medicare Other | Admitting: Pulmonary Disease

## 2021-02-20 DIAGNOSIS — I10 Essential (primary) hypertension: Secondary | ICD-10-CM | POA: Diagnosis not present

## 2021-02-20 DIAGNOSIS — G4700 Insomnia, unspecified: Secondary | ICD-10-CM | POA: Diagnosis not present

## 2021-02-20 DIAGNOSIS — F3341 Major depressive disorder, recurrent, in partial remission: Secondary | ICD-10-CM | POA: Diagnosis not present

## 2021-02-20 DIAGNOSIS — G20 Parkinson's disease: Secondary | ICD-10-CM | POA: Diagnosis not present

## 2021-02-20 DIAGNOSIS — E785 Hyperlipidemia, unspecified: Secondary | ICD-10-CM | POA: Diagnosis not present

## 2021-03-06 ENCOUNTER — Encounter: Payer: Self-pay | Admitting: Pulmonary Disease

## 2021-03-06 ENCOUNTER — Other Ambulatory Visit: Payer: Self-pay

## 2021-03-06 ENCOUNTER — Ambulatory Visit (INDEPENDENT_AMBULATORY_CARE_PROVIDER_SITE_OTHER): Payer: Medicare Other | Admitting: Pulmonary Disease

## 2021-03-06 VITALS — BP 98/58 | HR 108 | Ht <= 58 in | Wt 131.0 lb

## 2021-03-06 DIAGNOSIS — Z79899 Other long term (current) drug therapy: Secondary | ICD-10-CM | POA: Diagnosis not present

## 2021-03-06 DIAGNOSIS — D869 Sarcoidosis, unspecified: Secondary | ICD-10-CM | POA: Diagnosis not present

## 2021-03-06 DIAGNOSIS — R0602 Shortness of breath: Secondary | ICD-10-CM | POA: Diagnosis not present

## 2021-03-06 NOTE — Progress Notes (Signed)
Subjective:   PATIENT ID: Destiny Roth: female DOB: May 19, 1950, MRN: [id number]   HPI  Chief Complaint  Patient presents with   Follow-up    Back pain worse affecting breathing    Reason for Visit: Follow-up  [name] is a 71 year old with Parkinson's disease, HTN, HLD and restrictive lung disease who presents for follow-up for sarcoid management.   Synopsis: Initially seen as an inpatient Pulmonary consult by me on 02/23/19 for hypercalcemia and consideration of bronchoscopy to rule out sarcoid. However due to lack of pulmonary symptoms and normal CT at the time, bronchoscopy thought to be low-yield. Bone biopsy was performed and demonstrated granulomas disease without necrosis. She was started on steroids and discharged with Endocrine and Pulmonary follow-up. Of note, she was previously seen in our Pulmonary clinic in 2018 for dyspnea and mild sleep apnea. Her prior PFTs demonstrate mild restrictive defect with normal DLCO which patient stated was attributed to her Parkinson's disease.  09/23/19 Since our last visit, she has established care with multiple providers including Nephrology and Ophthalmology, the latter who is managing her immunosuppressants. She is currently on tacrolimus 1 mg BID, methotrexate and prednisone. Her prednisone is being weaned. She continues to have fatigue and visual blurriness. Her dyspnea also remains unchanged. Worsens with exertion. Her fatigue seems to be associated with this as well.  11/24/19 She is currently on methotrexate 12.5 mg weekly and on prednisone taper. Tacrolimus was discontinued by ophthalmology due tremors. Overall she is not feeling well with difficulty concentrating, balancing and general weakness. She reports visuo-spatial/depth perception issues and tinnitis (L>R) which she is going to see a sub-specialist in Neurology. 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 She reports unchanged chest pressure and difficulty with deep breaths. Her breaths have to be shallow. This seems to be occurring more frequently. No wheezing or cough.  02/22/20 She is no longer on methotrexate.  She was started on CellCept in November 2021.  She still continues to have issues with fatigue, tremors and word finding after switching to Cellcept.  She also reports lower extremity aching, no shooting or burning pain. When seen by her neurologist, she reports that her Parkinson's is stable. She was referred to Dr. Harriette Roth to evaluate for neuro involvement of her sarcoid. She is planned for an MRI of her spine and an EMG to evaluate for neuropathy   04/17/20 Since our last visit, her cellcept was increased. Her ophthalmology note from 03/14/20 by Dr. Manuella Roth was reviewed. She reported visual abnormalities however her ocular exam shows stable inflammation so she was advised to continue her current dose of cellcept. She reports worsening shortness of breath and substernal chest pain. Worsens with exertion. Denies wheezing.   05/19/20 She has completed her steroids and feels it made a significant difference in the chest pressure she was feeling but her shortness of breath remains persistent. Her ophthalmology note from 05/16/20 by Dr. Manuella Roth was reviewed. Due to reported side effects, she has requested stopping taking immunosuppressants to see if these are the cause of her symptoms of fatigue, memory and tremor issues. Dr. Manuella Roth does want to resume immunosuppressants and has offered Humira as the next line in treatment. She reports hesitancy in starting any other medications at this time.  06/09/20 She reports her vision is the same with floaters. Her fatigue and tremors have improved off the cellcept  however it remains persistent. She will have good days and bad days. Unclear if her memory is better off cellcept. She had questions  regarding Humira and if cellcept or methotrexate would be tried again. For her breathing she still has difficulty getting a full breath in and believes this contributes to her fatigue as well.  09/12/20 Since our last visit she was started on Humira on 07/11/20. She is tolerating it well. She states that her symptoms of fatigue have improved off cellcept. On review of Neuro notes, she was started on Amantadine for progressive dyskinesia related to her Parkinsons. She is unsure if the inhaler helps but she is compliant with Incruse daily. She recently had covid-19 one month ago and has recovered with baseline shortness of breath.  12/28/20 Since our last visit, she is compliant with her Humira. She is reports that she is still having some chest tightness and some difficulty breathing all the way. She was seen by Neurology and gabapentin increased for newly diagnosed small fiber neuropathy. She reports that her vision is still distorted and occurs in the day but worsens at night. Denies double vision or floaters.  03/06/21 Since our last visit, she has completed her steroid taper with shortness of breath and neuropathy pain improved however not as effective in the past. She reports she has had worsening back pain which seems to affect her breathing and limiting her activity. She is unable to use her stationary bike. She is compliant with Humira. Ophthalmology noted by Dr. Manuella Roth on 01/16/21 reviewed with no signs of active inflammation on eye exam.   Social History: Never smoker  Past Medical History:  Diagnosis Date   Abdominal pain, epigastric 09/18/2009   Anosmia 11/06/2010   ANXIETY 09/23/2006   BUNION, RIGHT FOOT 09/18/2009   CARPAL TUNNEL SYNDROME, BILATERAL 09/23/2006   DEPRESSION 09/23/2006   DIVERTICULOSIS, COLON 09/23/2006   GERD 05/24/2008   GLUCOSE INTOLERANCE 05/24/2008   Hemorrhoids    Hypercalcemia    HYPERLIPIDEMIA 09/23/2006   HYPERTENSION 09/23/2006   IBS 09/23/2006   Impaired glucose  tolerance 11/04/2010   OSTEOPOROSIS 05/24/2008   06/01/19-pt states osteopenia   Parkinson's disease ([place])    Restrictive lung disease 12/20/2013   Sarcoidosis 02/26/2019      Outpatient Medications Prior to Visit  Medication Sig Dispense Refill   acetaminophen (TYLENOL) 500 MG tablet Take 1,000 mg by mouth every 6 (six) hours as needed for mild pain.     Adalimumab (HUMIRA PEN) 40 MG/0.4ML PNKT Inject 40 mg into the skin every 14 (fourteen) days. 6 each 0   Amantadine HCl 100 MG tablet Take 1 tablet (100 mg total) by mouth daily.     amLODipine (NORVASC) 5 MG tablet Take 1 tablet (5 mg total) by mouth daily. 90 tablet 3   aspirin 81 MG tablet Take 81 mg by mouth daily.     carbidopa-levodopa (SINEMET IR) 25-100 MG tablet TAKE ONE TABLET BY MOUTH THREE TIMES DAILY (Patient taking differently: Take 1 tablet by mouth See admin instructions. Take 1 tablet by mouth four times a day- 8 AM, 12 PM, and between 5-6 PM) 270 tablet 1   dicyclomine (BENTYL) 20 MG tablet Take 20 mg by mouth 4 (four) times daily as needed for spasms.     diphenhydrAMINE (BENADRYL) 25 MG tablet Take 25 mg by mouth at bedtime as needed for sleep.     diphenhydramine-acetaminophen (TYLENOL PM) 25-500 MG TABS tablet Take 1 tablet by mouth at bedtime as needed.  gabapentin (NEURONTIN) 100 MG capsule Take 100 mg by mouth as directed.     INCRUSE ELLIPTA 62.5 MCG/ACT AEPB TAKE 1 PUFF BY MOUTH EVERY DAY 30 each 0   rOPINIRole (REQUIP) 1 MG tablet Take 1 tablet (1 mg total) by mouth 3 (three) times daily. (Patient taking differently: Take 1 mg by mouth See admin instructions. Take 1 mg by mouth three times a day- 8 AM, 12 PM, and between 5-6 PM) 90 tablet 0   rosuvastatin (CRESTOR) 10 MG tablet Take 1 tablet (10 mg total) by mouth daily. 90 tablet 3   senna (SENOKOT) 8.6 MG TABS tablet Take 1 tablet (8.6 mg total) by mouth 2 (two) times daily. 30 tablet 0   sertraline (ZOLOFT) 100 MG tablet Take 100 mg by mouth daily.     No  facility-administered medications prior to visit.    Review of Systems  Constitutional:  Negative for chills, diaphoresis, fever, malaise/fatigue and weight loss.  HENT:  Negative for congestion.   Respiratory:  Positive for shortness of breath. Negative for cough, hemoptysis, sputum production and wheezing.   Cardiovascular:  Negative for chest pain, palpitations and leg swelling.  Neurological:  Positive for dizziness, tingling and weakness. Negative for focal weakness.    Objective:   Vitals:   03/06/21 1412  BP: (!) 98/58  Pulse: (!) 108  SpO2: 95%  Weight: 131 lb (59.4 kg)  Height: _0  (1.448 m)   SpO2: 95 % O2 Device: None (Room air)  Physical Exam: General: Well-appearing, frail no acute distress HENT: [place], AT Eyes: EOMI, no scleral icterus Respiratory: Clear to auscultation bilaterally.  No crackles, wheezing or rales Cardiovascular: RRR, -M/R/G, no JVD Extremities:-Edema,-tenderness Neuro: AAO x4, CNII-XII grossly intact Psych: Normal mood, normal affect  Data Reviewed:  Imaging: CXR 02/23/19 - interval resolutions of bilateral pleural effusions CT Chest 02/09/19 - Bilateral pleural effusions. Mild ground glass opacifications bilaterally. No enlarged mediastinal or hilar adenopathy. PET Myocardial 01/07/20 - No evidence of increased uptake PET/CT 01/07/20 1. Bilateral ground-glass pulmonary opacities demonstrate mild-moderate FDG avidity. These findings are nonspecific and may be infectious or inflammatory.2. Mild FDG activity of non-enlarged bilateral hilar lymph nodes is also nonspecific.  This could be reactive in the setting of the pulmonary findings, but activity related to sarcoid cannot be excluded.  CXR 03/30/20 - No infiltrate, effusion or edema.   PFT: 12/17/13 FVC 2.26 (86%) FEV1 1.88 (93%) Ratio 78  TLC 70% DLCO 75% Interpretation: Mild restrictive defect with mild reduction in gas exchange (uncorrected DLCO)  06/22/19 FVC 2.07 (95%) FEV1 1.75 (107%)  Ratio 80  TLC 91% DLCO 63% Interpretation: No obstructive or restrictive defect. Mild reduction in gas exchange   06/10/20 FVC 2.11 (99%) FEV1 1.8 (113%) Ratio 82  TLC 132% RV 162% RV/TLC 130% DLCO 94% Interpretation: Normal spirometry and gas exchange. Compared to prior PFTs, patient has had increased lung volumes suggestive of air trapping. No significant bronchodilator response however does not preclude benefit. Clinically correlate  09/12/20 FVC 2.17 (102%) FEV1 1.87 (117%) Ratio 86  TLC 105% DLCO 88% Interpretation: Normal PFTs. Air trapping has resolved.  Echocardiogram: 04/22/19 - EF 60-65%. Grade I DD. No WMA or valvular abnormalities.  CBC CBC Latest Ref Rng & Units 12/14/2020 08/29/2020 08/03/2020  WBC 4.0 - 10.5 K/uL 6.5 6.4 5.9  Hemoglobin 12.0 - 15.0 g/dL 14.7 12.6 13.6  Hematocrit 36.0 - 46.0 % 44.4 37.9 40.3  Platelets 150.0 - 400.0 K/uL 288.0 337.0 327.0   BMP Latest Ref  Rng & Units 12/14/2020 08/29/2020 08/03/2020  Glucose 70 - 99 mg/dL 101(H) 98 106(H)  BUN 6 - 23 mg/dL 29(H) 16 29(H)  Creatinine 0.40 - 1.20 mg/dL 1.42(H) 1.15 1.48(H)  BUN/Creat Ratio 6 - 22 (calc) - - -  Sodium 135 - 145 mEq/L 137 137 137  Potassium 3.5 - 5.1 mEq/L 4.1 4.1 4.2  Chloride 96 - 112 mEq/L 101 104 103  CO2 19 - 32 mEq/L _0 Calcium 8.4 - 10.5 mg/dL 10.0 9.3 9.7  Calcium is wnl. Cr incrased compared to prior however similar to earlier this year  Hepatic Function Latest Ref Rng & Units 12/14/2020 08/29/2020 08/03/2020  Total Protein 6.0 - 8.3 g/dL 7.9 7.1 7.4  Albumin 3.5 - 5.2 g/dL 4.7 4.0 4.6  AST 0 - 37 U/L _1 ALT 0 - 35 U/L _2 Alk Phosphatase 39 - 117 U/L 66 65 58  Total Bilirubin 0.2 - 1.2 mg/dL 0.5 0.3 0.4  Bilirubin, Direct 0.0 - 0.3 mg/dL - - -  Normal LFTs  Quantiferon TB Gold Latest Ref Rng & Units 06/30/2020  Quantiferon TB Gold Plus NEGATIVE NEGATIVE    Assessment & Plan:   Discussion:  71 year old female with sarcoid, Parkinson's disease who presents for  follow-up. On Humira. Recently completed prednisone taper for flare. She has been on methotrexate and CellCept however discontinued due to adverse effects including fatigue.  During the course of her medical management and it has been difficult to elicit whether her symptoms are related to progressive Parkinson's, uncontrolled sarcoid and medications.  Sarcoid with pulm, ocular, renal, bone and small fiber neuropathy involvement --Dx 02/26/19 vis bone biopsy at [facility] --Last PFT 08/2020: Normal spirometry --Echo reviewed from 03/2019 with grade I DD. EKG 03/2020 with unchanged prolonged PR interval   --Sarcoid PET 12/2019 - negative for cardiac involvement. Suggests pulmonary involvement --Scheduled sarcoid PET in Feb 2023 after prednisone taper --Trend CBC and CMP q 3 months. Will monitor TB gold annually.  High risk medication management --Completed prednisone taper 03/2020 --Started methotrexate 04/2020 --Started cellcept 07/2020. 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 Johnney Ou. Will CC note and labs. --Will need to resume  monthly labs if restarted on Cellcept  Small fiber neuropathy secondary to sarcoid Parkinson's disease  --MRI Brain 03/2019 neg for neurosarcoid --Spine MRI 02/2020 neg for spinal sarcoid  --Followed by [facility] Neurology: Dr. Lezlie Octave for Parkinson's  --Followed [facility]oid Dr. [name] at [facility]. On gabapentin  Health Maintenance Immunization History  Administered Date(s) Administered   Influenza Split 12/06/2019   Influenza, High Dose Seasonal PF 11/30/2015, 11/16/2016, 10/29/2017, 10/30/2018, 12/09/2018, 10/31/2020   Influenza, Seasonal, Injecte, Preservative Fre 10/20/2012   Influenza,inj,Quad PF,6+ Mos 11/18/2013   Influenza-Unspecified 11/18/2013, 11/30/2014, 10/29/2017, 10/29/2018   Moderna Sars-Covid-2  Vaccination 03/12/2019, 04/09/2019, 12/06/2019, 05/18/2020   Pneumococcal Conjugate-13 01/01/2013, 01/05/2014   Pneumococcal Polysaccharide-23 01/01/2013, 01/05/2014, 02/25/2020   Tdap 11/06/2010   Zoster Recombinat (Shingrix) 01/10/2018, 03/31/2018   Zoster, Live 11/06/2010, 01/15/2018, 03/20/2018   CT Lung Screen - not qualified  No orders of the defined types were placed in this encounter.  No orders of the defined types were placed in this encounter.  Return in about 2 months (around 05/04/2021).  I have spent a total time of 42-minutes on the day of the appointment reviewing prior documentation, coordinating care and discussing medical diagnosis and plan with the patient/family. Past medical history, allergies, medications were reviewed. Pertinent imaging, labs and tests included in this note have been reviewed and interpreted independently by me.  [place], MD [facility] 03/06/2021 2:52 PM  Office Number [phone]

## 2021-03-06 NOTE — Patient Instructions (Addendum)
Sarcoid with pulm, ocular, renal, bone and small fiber neuropathy involvement --Scheduled sarcoid PET in Feb 2023   Follow-up with me in 2 months

## 2021-03-08 ENCOUNTER — Encounter: Payer: Self-pay | Admitting: Pulmonary Disease

## 2021-03-08 DIAGNOSIS — M5416 Radiculopathy, lumbar region: Secondary | ICD-10-CM | POA: Diagnosis not present

## 2021-03-09 ENCOUNTER — Telehealth: Payer: Self-pay | Admitting: Pulmonary Disease

## 2021-03-09 DIAGNOSIS — D860 Sarcoidosis of lung: Secondary | ICD-10-CM | POA: Diagnosis not present

## 2021-03-09 DIAGNOSIS — R918 Other nonspecific abnormal finding of lung field: Secondary | ICD-10-CM | POA: Diagnosis not present

## 2021-03-09 DIAGNOSIS — E785 Hyperlipidemia, unspecified: Secondary | ICD-10-CM | POA: Diagnosis not present

## 2021-03-09 DIAGNOSIS — R590 Localized enlarged lymph nodes: Secondary | ICD-10-CM | POA: Diagnosis not present

## 2021-03-09 DIAGNOSIS — D8685 Sarcoid myocarditis: Secondary | ICD-10-CM | POA: Diagnosis not present

## 2021-03-09 NOTE — Telephone Encounter (Signed)
Spoke with [facility] who wanted to verify that PET scan was skull to thigh. Ordered verified. Nothing further needed at this time.

## 2021-03-13 ENCOUNTER — Telehealth: Payer: Self-pay | Admitting: Pulmonary Disease

## 2021-03-13 DIAGNOSIS — D869 Sarcoidosis, unspecified: Secondary | ICD-10-CM

## 2021-03-13 DIAGNOSIS — Z79899 Other long term (current) drug therapy: Secondary | ICD-10-CM

## 2021-03-13 NOTE — Telephone Encounter (Signed)
Please start patient on methotrexate 5 mg weekly with titration up to 10 mg weekly.  She has previously been on methotrexate in 2021 and possibly side effects however difficult to tease out her symptoms due to polypharmacy and dx of Parkinson's disease.   She is currently on Humira which I would like to continue while we add the methotrexate for her uncontrolled sarcoidosis.  Thank you,  Rodman Pickle, M.D. [facility]monary/Critical Care Medicine 03/13/2021 6:16 PM

## 2021-03-13 NOTE — Telephone Encounter (Signed)
[facility] Telephone Encounter  Reviewed PET CT on 03/09/21. Continues to have evidence of active pulmonary sarcoid on Humira. No cardiac involvement. I have updated patient on results and recommended addition of biologic. We discussed the risks and benefits of methotrexate vs cellcept. After shared-decision making, patient has agreed to start with methotrexate.  Will message Pharmacy team to add Methotrexate to her current regimen.

## 2021-03-16 NOTE — Telephone Encounter (Signed)
Pharmacy Note  Subjective: Received notification from Dr. Loanne Drilling regarding patient's active pulmonary sarcoidosis and addition of methotrexate. She currently takes Humira 40mg  SQ every 14 days.  Patient had previously taken MTX but had intolerable side effects -> changed to tacrolimus -> changed to mycophenolate to which she had GI side effects  Objective: CBC    Component Value Date/Time   WBC 8.3 03/19/2021 1530   RBC 4.38 03/19/2021 1530   HGB 13.1 03/19/2021 1530   HCT 39.4 03/19/2021 1530   PLT 330.0 03/19/2021 1530   MCV 89.8 03/19/2021 1530   MCH 29.2 03/29/2020 2356   MCHC 33.3 03/19/2021 1530   RDW 14.7 03/19/2021 1530   LYMPHSABS 1.1 03/19/2021 1530   MONOABS 0.5 03/19/2021 1530   EOSABS 0.1 03/19/2021 1530   BASOSABS 0.1 03/19/2021 1530    CMP     Component Value Date/Time   NA 136 03/19/2021 1530   K 4.0 03/19/2021 1530   CL 102 03/19/2021 1530   CO2 29 03/19/2021 1530   GLUCOSE 121 (H) 03/19/2021 1530   BUN 27 (H) 03/19/2021 1530   CREATININE 1.42 (H) 03/19/2021 1530   CREATININE 1.28 (H) 07/23/2019 1625   CALCIUM 10.0 03/19/2021 1530   CALCIUM 9.7 02/05/2019 0739   PROT 7.7 03/19/2021 1530   ALBUMIN 4.3 03/19/2021 1530   AST 19 03/19/2021 1530   ALT 5 03/19/2021 1530   ALKPHOS 67 03/19/2021 1530   BILITOT 0.6 03/19/2021 1530   GFRNONAA 37 (L) 03/29/2020 2356   GFRNONAA 43 (L) 04/27/2019 1604   GFRAA 50 (L) 04/27/2019 1604    Baseline Immunosuppressant Therapy Labs TB GOLD Quantiferon TB Gold Latest Ref Rng & Units 06/30/2020  Quantiferon TB Gold Plus NEGATIVE NEGATIVE   Hepatitis Panel Hepatitis Latest Ref Rng & Units 04/27/2019  Hep B Surface Ag NON-REACTI NON-REACTIVE  Hep B IgM NON-REACTI NON-REACTIVE  Hep C Ab NON-REACTI NON-REACTIVE  Hep C Ab NON-REACTI NON-REACTIVE  Hep A IgM NON-REACTI NON-REACTIVE   HIV Lab Results  Component Value Date   HIV NON-REACTIVE 04/27/2019   HIV NON REACTIVE 02/04/2019   Immunoglobulins   SPEP Serum  Protein Electrophoresis Latest Ref Rng & Units 03/19/2021  Total Protein 6.0 - 8.3 g/dL 7.7  Albumin 2.9 - 4.4 g/dL -  Alpha-1 0.0 - 0.4 g/dL -  Alpha-2 0.4 - 1.0 g/dL -  Beta Globulin 0.7 - 1.3 g/dL -  Gamma Globulin 0.4 - 1.8 g/dL -  Interpretation - -   G6PD Lab Results  Component Value Date   G6PDH 19.6 05/26/2019   Chest-xray:  03/30/20 - Mild cardiomegaly. No acute cardiopulmonary process.  Contraception: post-menopausal  Assessment/Plan:   Patient was counseled on the purpose, proper use, and adverse effects of methotrexate including nausea, infection, and signs and symptoms of pneumonitis. Discussed that there is the possibility of an increased risk of malignancy, specifically lymphomas, but it is not well understood if this increased risk is due to the medication or the disease state.  Instructed patient that medication should be held for infection and prior to surgery.   Reviewed instructions with patient to take methotrexate weekly along with folic acid daily.  Discussed the importance of frequent monitoring of kidney and liver function and blood counts, and provided patient with standing lab instructions.  Counseled patient to avoid NSAIDs and alcohol while on methotrexate.    She will need updated CMET and CBC prior to starting therapy.  Dose: Week 1 and Week 2: methotrexate will be 5mg  weekly  along with folic acid 1mg  daily. Week 3 and onwards: methotrexate 10mg  weekly with folic acid 1 mg daily Repeat labs at Week 4  Monitoring: She will need updated CMET and CBC prior to starting therapy - CBC and CMP stable (03/19/21) Repeat CBC/CMP 1 month after starting MTX, then every 3 months routinely. She will need updated TB gold annually. Patient verbalized understanding.  F/u: with Dr. Loanne Drilling on 05/17/21.  Kn[place] Saliva, PharmD, MPH, BCPS Clinical Pharmacist (Rheumatology and Pulmonology)

## 2021-03-19 ENCOUNTER — Other Ambulatory Visit (INDEPENDENT_AMBULATORY_CARE_PROVIDER_SITE_OTHER): Payer: Medicare Other

## 2021-03-19 DIAGNOSIS — N1832 Chronic kidney disease, stage 3b: Secondary | ICD-10-CM | POA: Diagnosis not present

## 2021-03-19 DIAGNOSIS — Z79899 Other long term (current) drug therapy: Secondary | ICD-10-CM | POA: Diagnosis not present

## 2021-03-19 LAB — CBC WITH DIFFERENTIAL/PLATELET
Basophils Absolute: 0.1 10*3/uL (ref 0.0–0.1)
Basophils Relative: 1.2 % (ref 0.0–3.0)
Eosinophils Absolute: 0.1 10*3/uL (ref 0.0–0.7)
Eosinophils Relative: 1 % (ref 0.0–5.0)
HCT: 39.4 % (ref 36.0–46.0)
Hemoglobin: 13.1 g/dL (ref 12.0–15.0)
Lymphocytes Relative: 13 % (ref 12.0–46.0)
Lymphs Abs: 1.1 10*3/uL (ref 0.7–4.0)
MCHC: 33.3 g/dL (ref 30.0–36.0)
MCV: 89.8 fl (ref 78.0–100.0)
Monocytes Absolute: 0.5 10*3/uL (ref 0.1–1.0)
Monocytes Relative: 6.3 % (ref 3.0–12.0)
Neutro Abs: 6.5 10*3/uL (ref 1.4–7.7)
Neutrophils Relative %: 78.5 % — ABNORMAL HIGH (ref 43.0–77.0)
Platelets: 330 10*3/uL (ref 150.0–400.0)
RBC: 4.38 Mil/uL (ref 3.87–5.11)
RDW: 14.7 % (ref 11.5–15.5)
WBC: 8.3 10*3/uL (ref 4.0–10.5)

## 2021-03-19 LAB — COMPREHENSIVE METABOLIC PANEL
ALT: 5 U/L (ref 0–35)
AST: 19 U/L (ref 0–37)
Albumin: 4.3 g/dL (ref 3.5–5.2)
Alkaline Phosphatase: 67 U/L (ref 39–117)
BUN: 27 mg/dL — ABNORMAL HIGH (ref 6–23)
CO2: 29 mEq/L (ref 19–32)
Calcium: 10 mg/dL (ref 8.4–10.5)
Chloride: 102 mEq/L (ref 96–112)
Creatinine, Ser: 1.42 mg/dL — ABNORMAL HIGH (ref 0.40–1.20)
GFR: 36.96 mL/min — ABNORMAL LOW (ref >60.00)
Glucose, Bld: 121 mg/dL — ABNORMAL HIGH (ref 70–99)
Potassium: 4 mEq/L (ref 3.5–5.1)
Sodium: 136 mEq/L (ref 135–145)
Total Bilirubin: 0.6 mg/dL (ref 0.2–1.2)
Total Protein: 7.7 g/dL (ref 6.0–8.3)

## 2021-03-19 NOTE — Telephone Encounter (Signed)
Returned call to patient - she states she will come to our clinic today to have labs completed. She would like CMET faxed to Dr. Johnney Ou at [facility] once resulted since she was supposed to repeat hepatic panel  Fax for [facility] : [phone]  Knox Saliva, PharmD, MPH, BCPS Clinical Pharmacist (Rheumatology and Pulmonology)

## 2021-03-20 NOTE — Telephone Encounter (Signed)
Faxed CBC and CMET to Dr. Caprice Red office.

## 2021-03-21 MED ORDER — FOLIC ACID 1 MG PO TABS: 1.0000 mg | ORAL_TABLET | Freq: Every day | ORAL | 0 refills | Status: DC

## 2021-03-21 MED ORDER — METHOTREXATE 2.5 MG PO TABS: ORAL_TABLET | ORAL | 0 refills | Status: DC

## 2021-03-22 NOTE — Telephone Encounter (Signed)
Patient called office inquiring if there is an injectable formulation for methotrexate. There is vial and syringe formulation (which requires dexterity when drawing up medication and stability when injecting). There is also autoinjector - Rasuvo or Otrexup but patient may have to pursue patient assistance since they are not always affordable options for Medicare patients. Subcutaneous formulation does bypass Gi therefore significantly reduces risk for nausea/vomiting  Returned call to patient - phone went to VM. Left VM requesting return call to further review.  Knox Saliva, PharmD, MPH, BCPS Clinical Pharmacist (Rheumatology and Pulmonology)

## 2021-03-23 DIAGNOSIS — M5416 Radiculopathy, lumbar region: Secondary | ICD-10-CM | POA: Diagnosis not present

## 2021-04-03 ENCOUNTER — Other Ambulatory Visit: Payer: Self-pay | Admitting: Pulmonary Disease

## 2021-04-03 DIAGNOSIS — H3090 Unspecified chorioretinal inflammation, unspecified eye: Secondary | ICD-10-CM

## 2021-04-03 DIAGNOSIS — D869 Sarcoidosis, unspecified: Secondary | ICD-10-CM

## 2021-04-04 MED ORDER — HUMIRA (2 PEN) 40 MG/0.4ML ~~LOC~~ AJKT: 40.0000 mg | AUTO-INJECTOR | SUBCUTANEOUS | 0 refills | Status: DC

## 2021-04-12 DIAGNOSIS — Z20822 Contact with and (suspected) exposure to covid-19: Secondary | ICD-10-CM | POA: Diagnosis not present

## 2021-04-14 ENCOUNTER — Other Ambulatory Visit: Payer: Self-pay | Admitting: Pulmonary Disease

## 2021-04-14 ENCOUNTER — Encounter: Payer: Self-pay | Admitting: Pulmonary Disease

## 2021-04-14 DIAGNOSIS — Z79899 Other long term (current) drug therapy: Secondary | ICD-10-CM

## 2021-04-14 DIAGNOSIS — D869 Sarcoidosis, unspecified: Secondary | ICD-10-CM

## 2021-04-16 ENCOUNTER — Encounter: Payer: Self-pay | Admitting: Pulmonary Disease

## 2021-04-16 MED ORDER — METHOTREXATE 2.5 MG PO TABS: 10.0000 mg | ORAL_TABLET | ORAL | 0 refills | Status: DC

## 2021-04-16 NOTE — Telephone Encounter (Signed)
I called the patient to  verify which pharmacy and she did not have any questions. Methotrexate was sent to [facility] per patient request.  ?

## 2021-04-16 NOTE — Telephone Encounter (Signed)
Dr. Loanne Drilling, ?Please see patient message regarding methotrexate and advise.  Thank you. ?

## 2021-04-16 NOTE — Telephone Encounter (Signed)
Dr. Loanne Drilling, please advise if dosage of methotrexate is correct prior to [place] sending it to pharmacy. ?

## 2021-04-16 NOTE — Telephone Encounter (Signed)
Staff please refill for methotrexate 10 mg once a week. One month supply. Will also need to continue folic acid 1 mg once a day. Offer refill if needed. ?She also has labs that are due one month after starting the methotrexate (CBC with diff and CMET) ?She will see me in follow-up on 05/17/21.  ?

## 2021-04-17 NOTE — Progress Notes (Signed)
? ?Cardiology Office Note   ? ?Date:  04/24/2021  ? ?ID:  Destiny Roth, Destiny Roth 08-24-1950, MRN [id number] ? ? ?PCP:  Leeroy Cha, MD ?  ?[place]  ?Cardiologist:  None   ?Advanced Practice Provider:  No care team member to display ?Electrophysiologist:  None  ? ?[id number]}  ? ?Chief Complaint  ?Patient presents with  ? Follow-up  ? ? ?History of Present Illness:  ?Destiny Roth is a 71 y.o. female with a history of Parkinsons, HTN, HL, sarcoid and restrictive lung dz.     ? ? 2015 chest tightnes, SOB  Myoview and echo OK  Continued to have symtpoms R and L heart cath    PA pressure  39/18  PCWP 14   L heart cath with LAD  30-40%; LCx normal, RCA 10%,  Vigorous LVEF     ? ? ?Patient saw Dr. Harrington Challenger 10/04/20 with continued exertional chest pain and DOE as well as dizziness. Stress echo was normal. ?  ?Myocardial perfusion study at [facility] 03/09/21 normal perfusion. Gated PET/CT normal LVEF-normal no evidence of sarcoid. ? ?Patient comes in for f/u. Feels very fatigued. BP running low and amlodipine decreased 2.5 mg  but still running low. No regular exercise. Trying to lose weight with calorie reduction and intermittent fasting. Not drinking enough water.  ? ? ?  ?  ? ? ? ?Past Medical History:  ?Diagnosis Date  ? Abdominal pain, epigastric 09/18/2009  ? Anosmia 11/06/2010  ? ANXIETY 09/23/2006  ? BUNION, RIGHT FOOT 09/18/2009  ? CARPAL TUNNEL SYNDROME, BILATERAL 09/23/2006  ? DEPRESSION 09/23/2006  ? DIVERTICULOSIS, COLON 09/23/2006  ? GERD 05/24/2008  ? GLUCOSE INTOLERANCE 05/24/2008  ? Hemorrhoids   ? Hypercalcemia   ? HYPERLIPIDEMIA 09/23/2006  ? HYPERTENSION 09/23/2006  ? IBS 09/23/2006  ? Impaired glucose tolerance 11/04/2010  ? OSTEOPOROSIS 05/24/2008  ? 06/01/19-pt states osteopenia  ? Parkinson's disease ([place])   ? Restrictive lung disease 12/20/2013  ? Sarcoidosis 02/26/2019  ? ? ?Past Surgical History:  ?Procedure Laterality Date  ? CARDIAC CATHETERIZATION    ? CATARACT EXTRACTION    ?  COLONOSCOPY  2008  ? COLONOSCOPY W/ BIOPSIES  2008  ? Dr. Collene Mares -negative random bxs  ? ESOPHAGOGASTRODUODENOSCOPY  2008  ? Dr. Harriette Bouillon duodenal ulcer  ? FOOT SURGERY Bilateral 2015  ? LEFT AND RIGHT HEART CATHETERIZATION WITH CORONARY ANGIOGRAM N/A 11/09/2013  ? Procedure: LEFT AND RIGHT HEART CATHETERIZATION WITH CORONARY ANGIOGRAM;  Surgeon: Peter M Martinique, MD;  Location: [facility] CATH LAB;  Service: Cardiovascular;  Laterality: N/A;  ? nasal septum repair    ? s/p bilat CTS    ? s/p fibroid ablation    ? s/p ganglion cyst right wrist    ? s/p right thumb trigger finger surgury    ? SHOULDER SURGERY Right 2015  ? TUBAL LIGATION    ? UPPER GASTROINTESTINAL ENDOSCOPY    ? ? ?Current Medications: ?Current Meds  ?Medication Sig  ? acetaminophen (TYLENOL) 500 MG tablet Take 1,000 mg by mouth every 6 (six) hours as needed for mild pain.  ? Adalimumab (HUMIRA PEN) 40 MG/0.4ML PNKT Inject 40 mg into the skin every 14 (fourteen) days.  ? Amantadine HCl 100 MG tablet Take 1 tablet (100 mg total) by mouth daily.  ? aspirin 81 MG tablet Take 81 mg by mouth daily.  ? carbidopa-levodopa (SINEMET IR) 25-100 MG tablet Take by mouth 3 (three) times daily. 1 tab in the morning and half tab  at noon/4pm/bedtime  ? Carbidopa-Levodopa ER (SINEMET CR) 25-100 MG tablet controlled release Take 1 tablet by mouth 2 (two) times daily.  ? dicyclomine (BENTYL) 20 MG tablet Take 20 mg by mouth 4 (four) times daily as needed for spasms.  ? diphenhydrAMINE (BENADRYL) 25 MG tablet Take 25 mg by mouth at bedtime as needed for sleep.  ? diphenhydramine-acetaminophen (TYLENOL PM) 25-500 MG TABS tablet Take 1 tablet by mouth at bedtime as needed.  ? folic acid (FOLVITE) 1 MG tablet Take 1 tablet (1 mg total) by mouth daily.  ? gabapentin (NEURONTIN) 100 MG capsule Take by mouth as directed. Take 2 tabs in the morning, 1 noon/4pm/ 2 bedtime  ? methotrexate (RHEUMATREX) 2.5 MG tablet Take 4 tablets (10 mg total) by mouth once a week. Caution:Chemotherapy.  Protect from light.  ? rOPINIRole (REQUIP) 1 MG tablet Take 1 tablet (1 mg total) by mouth 3 (three) times daily. (Patient taking differently: Take 1 mg by mouth See admin instructions. Take 1 mg by mouth three times a day- 8 AM, 12 PM, and between 5-6 PM)  ? rosuvastatin (CRESTOR) 10 MG tablet Take 1 tablet (10 mg total) by mouth daily.  ? senna (SENOKOT) 8.6 MG TABS tablet Take 1 tablet (8.6 mg total) by mouth 2 (two) times daily.  ? sertraline (ZOLOFT) 100 MG tablet Take 100 mg by mouth daily.  ? [DISCONTINUED] amLODipine (NORVASC) 2.5 MG tablet Take 2.5 mg by mouth daily.  ?  ? ?Allergies:   Myrbetriq [mirabegron], Atorvastatin, and Zocor [simvastatin]  ? ?Social History  ? ?Socioeconomic History  ? Marital status: Married  ?  Spouse name: Not on file  ? Number of children: 2  ? Years of education: Not on file  ? Highest education level: Not on file  ?Occupational History  ? Not on file  ?Tobacco Use  ? Smoking status: Never  ?  Passive exposure: Yes  ? Smokeless tobacco: Never  ?Vaping Use  ? Vaping Use: Never used  ?Substance and Sexual Activity  ? Alcohol use: Not Currently  ?  Comment: Occ  ? Drug use: No  ? Sexual activity: Yes  ?  Partners: Male  ?  Birth control/protection: Post-menopausal  ?Other Topics Concern  ? Not on file  ?Social History Narrative  ? Not on file  ? ?Social Determinants of Health  ? ?Financial Resource Strain: Not on file  ?Food Insecurity: Not on file  ?Transportation Needs: Not on file  ?Physical Activity: Not on file  ?Stress: Not on file  ?Social Connections: Not on file  ?  ? ?Family History:  The patient's  family history includes Heart attack in her brother and mother; Heart disease (age of onset: 31) in her father; Hypertension in her brother, brother, brother, and father; Peripheral vascular disease in her mother.  ? ?ROS:   ?Please see the history of present illness.    ?ROS All other systems reviewed and are negative. ? ? ?PHYSICAL EXAM:   ?VS:  BP 92/72   Pulse 91   Ht  4' 9.5" (1.461 m)   Wt 130 lb (59 kg)   SpO2 96%   BMI 27.64 kg/m?   ?Physical Exam  ?GEN: Well nourished, well developed, in no acute distress  ?Neck: no JVD, carotid bruits, or masses ?Cardiac:RRR; no murmurs, rubs, or gallops  ?Respiratory:  clear to auscultation bilaterally, normal work of breathing ?GI: soft, nontender, nondistended, + BS ?Ext: without cyanosis, clubbing, or edema, Good distal pulses bilaterally ?Neuro:  Alert and Oriented x 3, ?Psych: euthymic mood, full affect ? ?Wt Readings from Last 3 Encounters:  ?04/24/21 130 lb (59 kg)  ?03/06/21 131 lb (59.4 kg)  ?12/28/20 130 lb 6.4 oz (59.1 kg)  ?  ? ? ?Studies/Labs Reviewed:  ? ?EKG:  EKG is not ordered today.    ?Recent Labs: ?03/19/2021: ALT 5; BUN 27; Creatinine, Ser 1.42; Hemoglobin 13.1; Platelets 330.0; Potassium 4.0; Sodium 136  ? ?Lipid Panel ?   ?Component Value Date/Time  ? CHOL 106 02/06/2019 0920  ? TRIG 240 (H) 02/06/2019 0920  ? HDL <10 (L) 02/06/2019 0920  ? CHOLHDL NOT CALCULATED 02/06/2019 0920  ? VLDL 48 (H) 02/06/2019 0920  ? [place] NOT CALCULATED 02/06/2019 0920  ? LDLDIRECT 104.0 07/07/2014 1225  ? ? ?Additional studies/ records that were reviewed today include:  ? ?Cardiac PET/CT 03/09/21 ?IMAGE PROTOCOL Sarcoid Modality: PET- Discovery MI ?Radiopharmaceutical Dose (mCi) Imaging Date Inj Time Img Time Rescan Reason Rescan Time ?Rest: RB-82 Rubidium, IV 30.1 03/09/2021 1132 1132 ?Rest Administration Site: Left antecubital fossa Tech Administering Rest Dose: Erskine Squibb, R.T.(N) ?Stress Administration Site: Tech Administering Stress Dose: Hurshel Keys, CNMT ?FUNCTIONAL RESULTS (calculated via Gated SPECT) ? ? ?Rest Image LV EF: 73 % ?Rest Image LV EF: ?Rest EDV: 63 ml EDVI: 40 ml/m? ?Rest ESV: 17 ml ESVI: 11 ml/m? ? ?Wall Motion Wall Thickening ?Anterior: Normal Normal ?Apex: Normal Normal ?Inferior: Normal Normal ?Lateral: Normal Normal ?Septal: Normal Normal ? ?LV Ejection Fraction & Size: Gated Cardiac PET/CT Functional  Study Demonstrate Normal Left Ventricular Ejection ?Fraction and Volumes. ? ? ?LV Function & Wall Thickening: Gated Cardiac PET/CT Functional Study Demonstrate Normal Left Ventricular Systolic ?Function, Wall

## 2021-04-18 DIAGNOSIS — D869 Sarcoidosis, unspecified: Secondary | ICD-10-CM | POA: Diagnosis not present

## 2021-04-18 DIAGNOSIS — N1832 Chronic kidney disease, stage 3b: Secondary | ICD-10-CM | POA: Diagnosis not present

## 2021-04-18 DIAGNOSIS — I129 Hypertensive chronic kidney disease with stage 1 through stage 4 chronic kidney disease, or unspecified chronic kidney disease: Secondary | ICD-10-CM | POA: Diagnosis not present

## 2021-04-24 ENCOUNTER — Other Ambulatory Visit: Payer: Self-pay

## 2021-04-24 ENCOUNTER — Ambulatory Visit (INDEPENDENT_AMBULATORY_CARE_PROVIDER_SITE_OTHER): Payer: Medicare Other | Admitting: Physician Assistant

## 2021-04-24 ENCOUNTER — Other Ambulatory Visit (INDEPENDENT_AMBULATORY_CARE_PROVIDER_SITE_OTHER): Payer: Medicare Other

## 2021-04-24 ENCOUNTER — Encounter: Payer: Self-pay | Admitting: Physician Assistant

## 2021-04-24 VITALS — BP 92/72 | HR 91 | Ht <= 58 in | Wt 130.0 lb

## 2021-04-24 DIAGNOSIS — E785 Hyperlipidemia, unspecified: Secondary | ICD-10-CM

## 2021-04-24 DIAGNOSIS — G20 Parkinson's disease: Secondary | ICD-10-CM | POA: Diagnosis not present

## 2021-04-24 DIAGNOSIS — I10 Essential (primary) hypertension: Secondary | ICD-10-CM | POA: Diagnosis not present

## 2021-04-24 DIAGNOSIS — I2510 Atherosclerotic heart disease of native coronary artery without angina pectoris: Secondary | ICD-10-CM

## 2021-04-24 DIAGNOSIS — D869 Sarcoidosis, unspecified: Secondary | ICD-10-CM | POA: Diagnosis not present

## 2021-04-24 DIAGNOSIS — Z79899 Other long term (current) drug therapy: Secondary | ICD-10-CM

## 2021-04-24 LAB — CBC WITH DIFFERENTIAL/PLATELET
Basophils Absolute: 0 10*3/uL (ref 0.0–0.1)
Basophils Relative: 0.6 % (ref 0.0–3.0)
Eosinophils Absolute: 0.1 10*3/uL (ref 0.0–0.7)
Eosinophils Relative: 1.3 % (ref 0.0–5.0)
HCT: 39.5 % (ref 36.0–46.0)
Hemoglobin: 13.3 g/dL (ref 12.0–15.0)
Lymphocytes Relative: 16.8 % (ref 12.0–46.0)
Lymphs Abs: 1.1 10*3/uL (ref 0.7–4.0)
MCHC: 33.6 g/dL (ref 30.0–36.0)
MCV: 90.8 fl (ref 78.0–100.0)
Monocytes Absolute: 0.5 10*3/uL (ref 0.1–1.0)
Monocytes Relative: 8.2 % (ref 3.0–12.0)
Neutro Abs: 4.7 10*3/uL (ref 1.4–7.7)
Neutrophils Relative %: 73.1 % (ref 43.0–77.0)
Platelets: 302 10*3/uL (ref 150.0–400.0)
RBC: 4.35 Mil/uL (ref 3.87–5.11)
RDW: 14 % (ref 11.5–15.5)
WBC: 6.4 10*3/uL (ref 4.0–10.5)

## 2021-04-24 LAB — COMPREHENSIVE METABOLIC PANEL
ALT: 7 U/L (ref 0–35)
AST: 17 U/L (ref 0–37)
Albumin: 4.4 g/dL (ref 3.5–5.2)
Alkaline Phosphatase: 64 U/L (ref 39–117)
BUN: 28 mg/dL — ABNORMAL HIGH (ref 6–23)
CO2: 23 mEq/L (ref 19–32)
Calcium: 10 mg/dL (ref 8.4–10.5)
Chloride: 103 mEq/L (ref 96–112)
Creatinine, Ser: 1.44 mg/dL — ABNORMAL HIGH (ref 0.40–1.20)
GFR: 36.32 mL/min — ABNORMAL LOW (ref >60.00)
Glucose, Bld: 121 mg/dL — ABNORMAL HIGH (ref 70–99)
Potassium: 4.2 mEq/L (ref 3.5–5.1)
Sodium: 136 mEq/L (ref 135–145)
Total Bilirubin: 0.4 mg/dL (ref 0.2–1.2)
Total Protein: 7.6 g/dL (ref 6.0–8.3)

## 2021-04-24 NOTE — Patient Instructions (Signed)
Medication Instructions:  ?Your physician has recommended you make the following change in your medication:  ? ?STOP: Amlodipine ? ?*If you need a refill on your cardiac medications before your next appointment, please call your pharmacy* ? ? ?Lab Work: ?None ?If you have labs (blood work) drawn today and your tests are completely normal, you will receive your results only by: ?MyChart Message (if you have MyChart) OR ?A paper copy in the mail ?If you have any lab test that is abnormal or we need to change your treatment, we will call you to review the results. ? ? ?Follow-Up: ?At [facility], you and your health needs are our priority.  As part of our continuing mission to provide you with exceptional heart care, we have created designated Provider Care Teams.  These Care Teams include your primary Cardiologist (physician) and Advanced Practice Providers (APPs -  Physician Assistants and Nurse Practitioners) who all work together to provide you with the care you need, when you need it. ? ? ?Your next appointment:   ?1 year(s) ? ?The format for your next appointment:   ?In Person ? ?Provider:   ?Dorris Carnes, MD  ? ? ?Other Instructions ?Your provider recommends that you maintain 150 minutes per week of moderate aerobic activity. ? ?Two Gram Sodium Diet 2000 mg ? ?What is Sodium? Sodium is a mineral found naturally in many foods. The most significant source of sodium in the diet is table salt, which is about 40% sodium.  Processed, convenience, and preserved foods also contain a large amount of sodium.  The body needs only 500 mg of sodium daily to function,  A normal diet provides more than enough sodium even if you do not use salt. ? ?Why Limit Sodium? A build up of sodium in the body can cause thirst, increased blood pressure, shortness of breath, and water retention.  Decreasing sodium in the diet can reduce edema and risk of heart attack or stroke associated with high blood pressure.  Keep in mind that there  are many other factors involved in these health problems.  Heredity, obesity, lack of exercise, cigarette smoking, stress and what you eat all play a role. ? ?General Guidelines: ?Do not add salt at the table or in cooking.  One teaspoon of salt contains over 2 grams of sodium. ?Read food labels ?Avoid processed and convenience foods ?Ask your dietitian before eating any foods not dicussed in the menu planning guidelines ?Consult your physician if you wish to use a salt substitute or a sodium containing medication such as antacids.  Limit milk and milk products to 16 oz (2 cups) per day. ? ?Shopping Hints: ?READ LABELS!! "Dietetic" does not necessarily mean low sodium. ?Salt and other sodium ingredients are often added to foods during processing. ? ? ? ?Menu Planning Guidelines ?Food Group Choose More Often Avoid  ?Beverages (see also the milk group All fruit juices, low-sodium, salt-free vegetables juices, low-sodium carbonated beverages Regular vegetable or tomato juices, commercially softened water used for drinking or cooking  ?Breads and Cereals Enriched white, wheat, rye and pumpernickel bread, hard rolls and dinner rolls; muffins, cornbread and waffles; most dry cereals, cooked cereal without added salt; unsalted crackers and breadsticks; low sodium or homemade bread crumbs Bread, rolls and crackers with salted tops; quick breads; instant hot cereals; pancakes; commercial bread stuffing; self-rising flower and biscuit mixes; regular bread crumbs or cracker crumbs  ?Desserts and Sweets Desserts and sweets mad with mild should be within allowance Instant pudding mixes  and cake mixes  ?Fats Butter or margarine; vegetable oils; unsalted salad dressings, regular salad dressings limited to 1 Tbs; light, sour and heavy cream Regular salad dressings containing bacon fat, bacon bits, and salt pork; snack dips made with instant soup mixes or processed cheese; salted nuts  ?Fruits Most fresh, frozen and canned fruits  Fruits processed with salt or sodium-containing ingredient (some dried fruits are processed with sodium sulfites  ? ? ? ? ? ? ?Vegetables Fresh, frozen vegetables and low- sodium canned vegetables Regular canned vegetables, sauerkraut, pickled vegetables, and others prepared in brine; frozen vegetables in sauces; vegetables seasoned with ham, bacon or salt pork  ?Condiments, Sauces, Miscellaneous ? Salt substitute with physician's approval; pepper, herbs, spices; vinegar, lemon or lime juice; hot pepper sauce; garlic powder, onion powder, low sodium soy sauce (1 Tbs.); low sodium condiments (ketchup, chili sauce, mustard) in limited amounts (1 tsp.) fresh ground horseradish; unsalted tortilla chips, pretzels, potato chips, popcorn, salsa (1/4 cup) Any seasoning made with salt including garlic salt, celery salt, onion salt, and seasoned salt; sea salt, rock salt, kosher salt; meat tenderizers; monosodium glutamate; mustard, regular soy sauce, barbecue, sauce, chili sauce, teriyaki sauce, steak sauce, Worcestershire sauce, and most flavored vinegars; canned gravy and mixes; regular condiments; salted snack foods, olives, picles, relish, horseradish sauce, catsup  ? ?Food preparation: Try these seasonings ?Meats:    ?Pork Sage, onion Serve with applesauce  ?Chicken Poultry seasoning, thyme, parsley Serve with cranberry sauce  ?Lamb Curry powder, rosemary, garlic, thyme Serve with mint sauce or jelly  ?Veal Marjoram, basil Serve with current jelly, cranberry sauce  ?Beef Pepper, bay leaf Serve with dry mustard, unsalted chive butter  ?[place] leaf, dill Serve with unsalted lemon butter, unsalted parsley butter  ?Vegetables:    ?Asparagus Lemon juice   ?Broccoli Lemon juice   ?Carrots Mustard dressing parsley, mint, nutmeg, glazed with unsalted butter and sugar   ?Green beans Marjoram, lemon juice, nutmeg,dill seed   ?Tomatoes Basil, marjoram, onion   ?Spice /blend for "Salt Shaker" 4 tsp ground thyme ?1 tsp ground sage  3 tsp ground rosemary ?4 tsp ground marjoram  ? ?Test your knowledge ?A product that says "Salt Free" may still contain sodium. True or False ?Garlic Powder and Hot Pepper Sauce an be used as alternative seasonings.True or False ?Processed foods have more sodium than fresh foods.  True or False ?Canned Vegetables have less sodium than froze True or False ? ? ?WAYS TO DECREASE YOUR SODIUM INTAKE ?Avoid the use of added salt in cooking and at the table.  Table salt (and other prepared seasonings which contain salt) is probably one of the greatest sources of sodium in the diet.  Unsalted foods can gain flavor from the sweet, sour, and butter taste sensations of herbs and spices.  Instead of using salt for seasoning, try the following seasonings with the foods listed.  Remember: how you use them to enhance natural food flavors is limited only by your creativity... ?Allspice-Meat, fish, eggs, fruit, peas, red and yellow vegetables ?Almond Extract-Fruit baked goods ?Anise Seed-Sweet breads, fruit, carrots, beets, cottage cheese, cookies (tastes like licorice) ?Basil-Meat, fish, eggs, vegetables, rice, vegetables salads, soups, sauces ?Bay Leaf-Meat, fish, stews, poultry ?Burnet-Salad, vegetables (cucumber-like flavor) ?Caraway Seed-Bread, cookies, cottage cheese, meat, vegetables, cheese, rice ?Cardamon-Baked goods, fruit, soups ?Celery Powder or seed-Salads, salad dressings, sauces, meatloaf, soup, bread.Do not use  celery salt ?Chervil-Meats, salads, fish, eggs, vegetables, cottage cheese (parsley-like flavor) ?Chili Power-Meatloaf, chicken cheese, corn, eggplant, egg dishes ?  Chives-Salads cottage cheese, egg dishes, soups, vegetables, sauces ?Cilantro-Salsa, casseroles ?Cinnamon-Baked goods, fruit, pork, lamb, chicken, carrots ?Cloves-Fruit, baked goods, fish, pot roast, green beans, beets, carrots ?Coriander-Pastry, cookies, meat, salads, cheese (lemon-orange flavor) ?Cumin-Meatloaf, fish,cheese, eggs, cabbage,fruit  pie (caraway flavor) ?[employer], fruit, eggs, fish, poultry, cottage cheese, vegetables ?Dill Seed-Meat, cottage cheese, poultry, vegetables, fish, salads, bread ?Fennel Seed-Bread, cookies, appl

## 2021-04-29 ENCOUNTER — Other Ambulatory Visit: Payer: Self-pay | Admitting: Pulmonary Disease

## 2021-04-29 DIAGNOSIS — D869 Sarcoidosis, unspecified: Secondary | ICD-10-CM

## 2021-04-29 DIAGNOSIS — Z79899 Other long term (current) drug therapy: Secondary | ICD-10-CM

## 2021-04-30 NOTE — Telephone Encounter (Signed)
Dr. Loanne Drilling, please advise if you are okay with [place] refilling pt's med or if refill request needs to be deferred to PCP. ?

## 2021-05-01 DIAGNOSIS — Z1389 Encounter for screening for other disorder: Secondary | ICD-10-CM | POA: Diagnosis not present

## 2021-05-01 DIAGNOSIS — G20 Parkinson's disease: Secondary | ICD-10-CM | POA: Diagnosis not present

## 2021-05-01 DIAGNOSIS — N1832 Chronic kidney disease, stage 3b: Secondary | ICD-10-CM | POA: Diagnosis not present

## 2021-05-01 DIAGNOSIS — Z0000 Encounter for general adult medical examination without abnormal findings: Secondary | ICD-10-CM | POA: Diagnosis not present

## 2021-05-01 DIAGNOSIS — Z23 Encounter for immunization: Secondary | ICD-10-CM | POA: Diagnosis not present

## 2021-05-01 DIAGNOSIS — R2681 Unsteadiness on feet: Secondary | ICD-10-CM | POA: Diagnosis not present

## 2021-05-01 DIAGNOSIS — F3341 Major depressive disorder, recurrent, in partial remission: Secondary | ICD-10-CM | POA: Diagnosis not present

## 2021-05-01 DIAGNOSIS — D869 Sarcoidosis, unspecified: Secondary | ICD-10-CM | POA: Diagnosis not present

## 2021-05-01 DIAGNOSIS — I10 Essential (primary) hypertension: Secondary | ICD-10-CM | POA: Diagnosis not present

## 2021-05-01 DIAGNOSIS — E785 Hyperlipidemia, unspecified: Secondary | ICD-10-CM | POA: Diagnosis not present

## 2021-05-02 IMAGING — DX DG CHEST 2V
2 series · 2 of 2 positions shown · non-contrast
Comparison: 02/23/2019, CT 02/09/2019

CLINICAL DATA: Sarcoid chest pressure

EXAM:
CHEST - 2 VIEW

[chest pa]
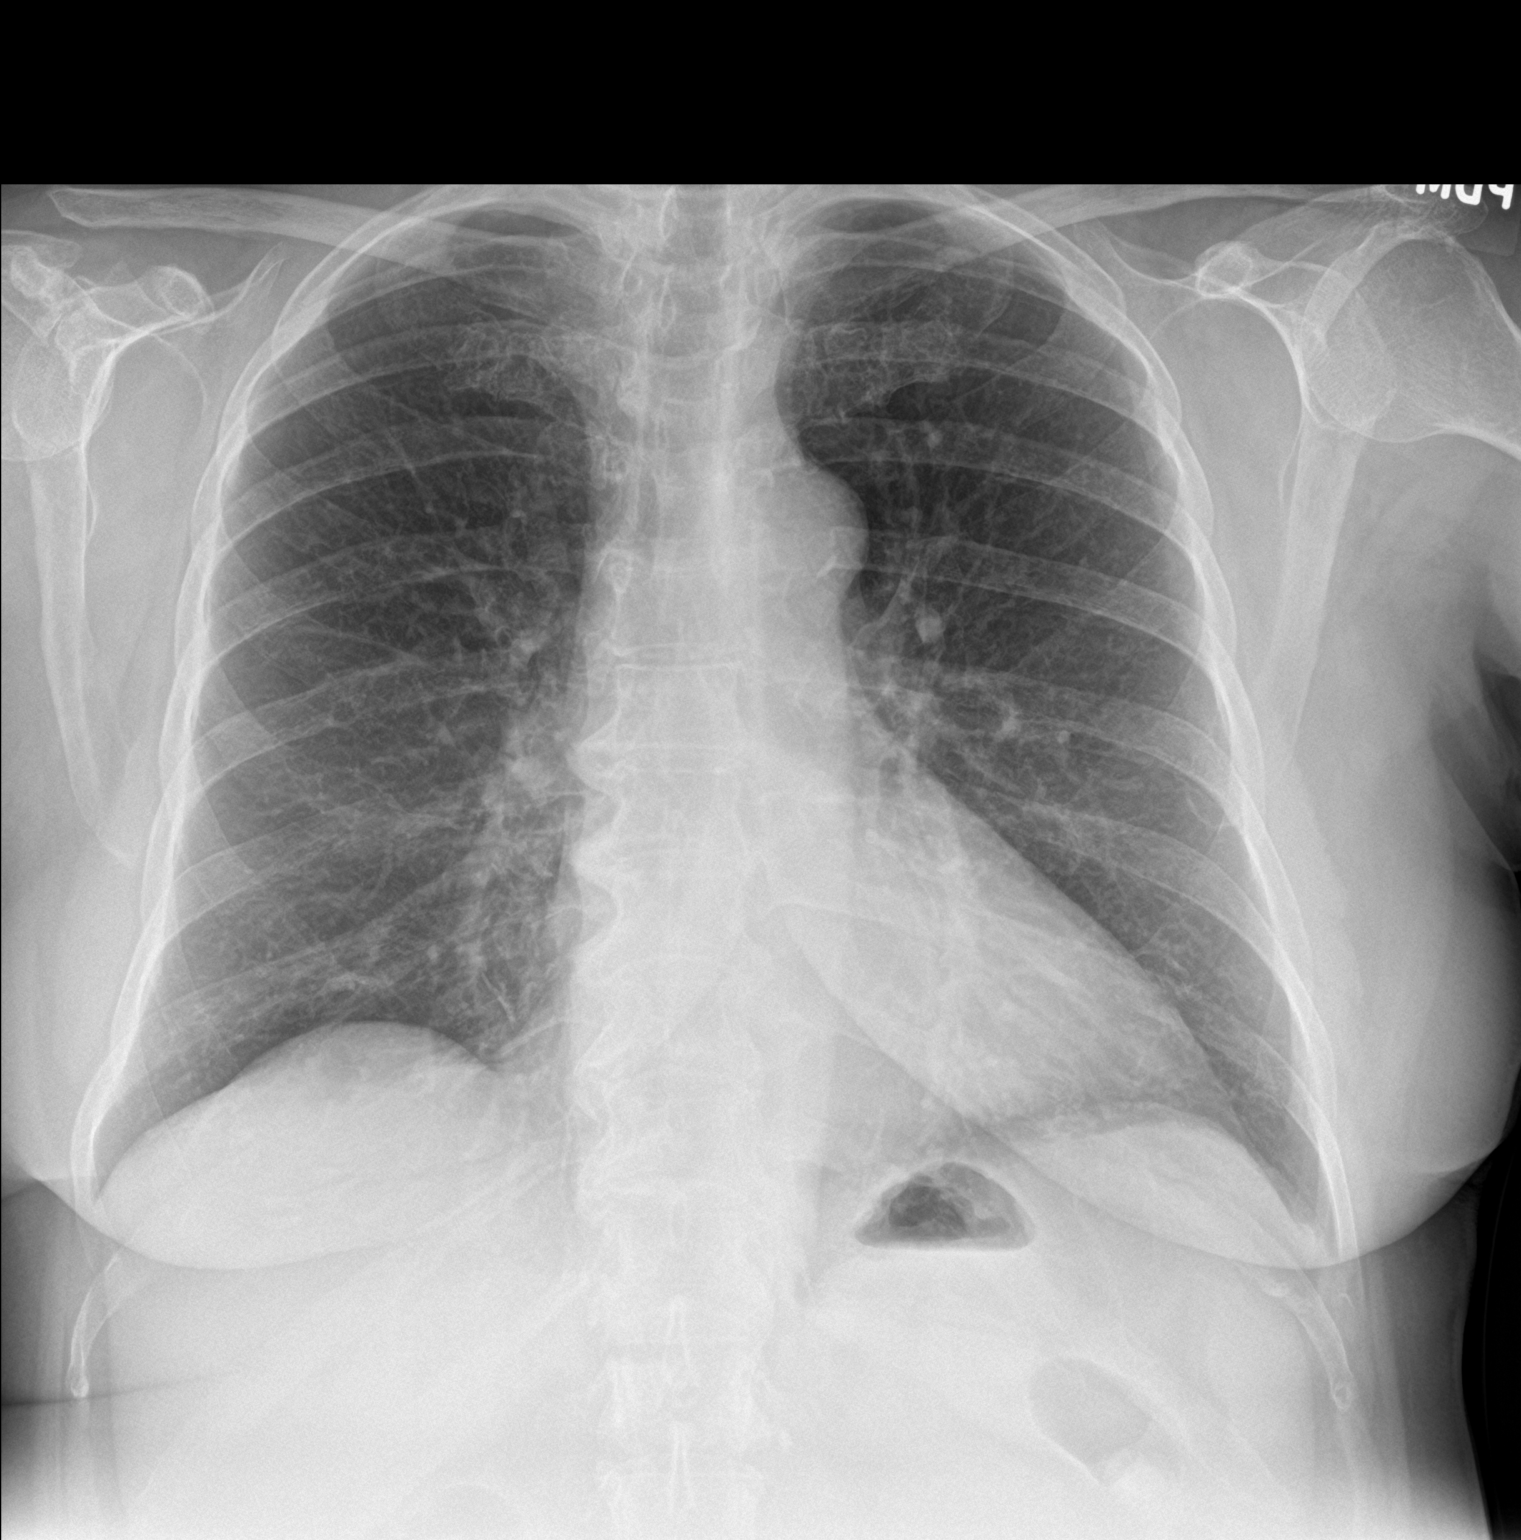

[chest lat]
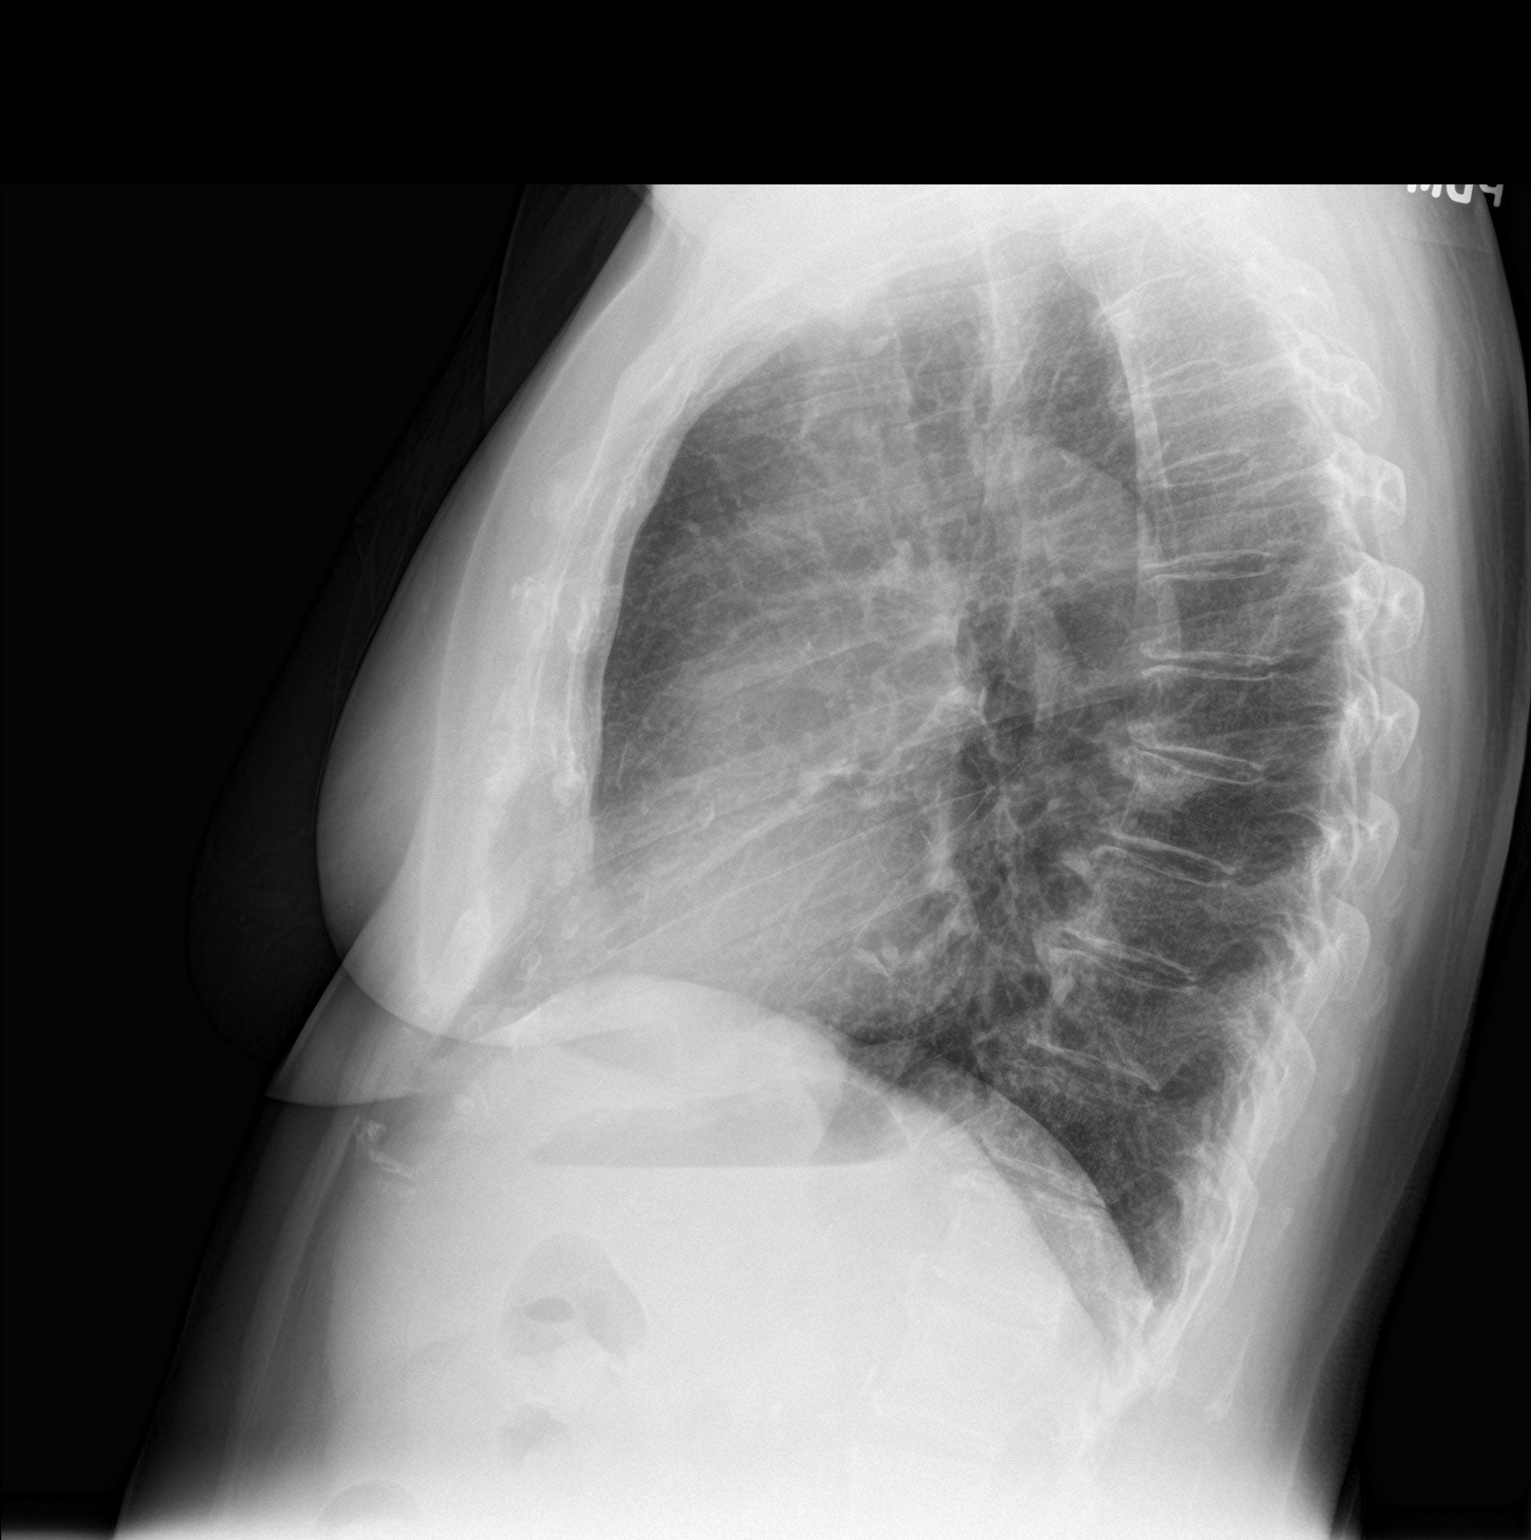

[2 of 2 positions shown; findings below may reference images not displayed]

FINDINGS: No focal opacity or pleural effusion. Normal cardiomediastinal
silhouette with aortic atherosclerosis. No pneumothorax.
IMPRESSION: No active cardiopulmonary disease.

## 2021-05-07 ENCOUNTER — Other Ambulatory Visit: Payer: Self-pay | Admitting: Pulmonary Disease

## 2021-05-07 NOTE — Telephone Encounter (Signed)
Dr. Loanne Drilling, please advise on refill request. ?

## 2021-05-08 DIAGNOSIS — Z20822 Contact with and (suspected) exposure to covid-19: Secondary | ICD-10-CM | POA: Diagnosis not present

## 2021-05-17 ENCOUNTER — Encounter: Payer: Self-pay | Admitting: Pulmonary Disease

## 2021-05-17 ENCOUNTER — Ambulatory Visit (INDEPENDENT_AMBULATORY_CARE_PROVIDER_SITE_OTHER): Payer: Medicare Other | Admitting: Pulmonary Disease

## 2021-05-17 VITALS — BP 140/82 | HR 85 | Ht <= 58 in | Wt 129.0 lb

## 2021-05-17 DIAGNOSIS — Z79899 Other long term (current) drug therapy: Secondary | ICD-10-CM | POA: Diagnosis not present

## 2021-05-17 DIAGNOSIS — D869 Sarcoidosis, unspecified: Secondary | ICD-10-CM | POA: Diagnosis not present

## 2021-05-17 MED ORDER — METHOTREXATE 2.5 MG PO TABS: 10.0000 mg | ORAL_TABLET | ORAL | 0 refills | Status: DC

## 2021-05-17 NOTE — Patient Instructions (Addendum)
Sarcoidosis ?ORDER labs: CBC with diff, CMET ?Please schedule appointment with your Ophthalmologist, Dr. Manuella Ghazi ?Methotrexate refilled ? ?Repeat labs in one month ? ?Follow-up with me two months ?

## 2021-05-17 NOTE — Progress Notes (Signed)
? ? ?Subjective:  ? ?PATIENT ID: Destiny Roth: female DOB: [birth year], MRN: [id number] ? ? ?HPI ? ?Chief Complaint  ?Patient presents with  ? Follow-up  ?  Eye sarcoid is showing her bird figures that aren't there  ? ? ?Reason for Visit: Follow-up ? ?Destiny Roth is a 71 year old with Parkinson's disease, HTN, HLD and restrictive lung disease who presents for follow-up for sarcoid management.  ? ?Synopsis: Initially seen as an inpatient Pulmonary consult by me on 02/23/19 for hypercalcemia and consideration of bronchoscopy to rule out sarcoid. However due to lack of pulmonary symptoms and normal CT at the time, bronchoscopy thought to be low-yield. Bone biopsy was performed and demonstrated granulomas disease without necrosis. She was started on steroids and discharged with Endocrine and Pulmonary follow-up. Of note, she was previously seen in our Pulmonary clinic in 2018 for dyspnea and mild sleep apnea. Her prior PFTs demonstrate mild restrictive defect with normal DLCO which patient stated was attributed to her Parkinson's disease. ? ?09/23/19 ?Since our last visit, she has established care with multiple providers including Nephrology and Ophthalmology, the latter who is managing her immunosuppressants. She is currently on tacrolimus 1 mg BID, methotrexate and prednisone. Her prednisone is being weaned. She continues to have fatigue and visual blurriness. Her dyspnea also remains unchanged. Worsens with exertion. Her fatigue seems to be associated with this as well. ? ?11/24/19 ?She is currently on methotrexate 12.5 mg weekly and on prednisone taper. Tacrolimus was discontinued by ophthalmology due tremors. Overall she is not feeling well with difficulty concentrating, balancing and general weakness. She reports visuo-spatial/depth perception issues and tinnitis (L>R) which she is going to see a sub-specialist in Neurology. 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 She reports unchanged chest pressure and difficulty with deep breaths. Her breaths have to be shallow. This seems to be occurring more frequently. No wheezing or cough. ? ?02/22/20 ?She is no longer on methotrexate.  She was started on CellCept in November 2021.  She still continues to have issues with fatigue, tremors and word finding after switching to Cellcept.  She also reports lower extremity aching, no shooting or burning pain. When seen by her neurologist, she reports that her Parkinson's is stable. She was referred to Dr. Harriette Ohara to evaluate for neuro involvement of her sarcoid. She is planned for an MRI of her spine and an EMG to evaluate for neuropathy  ? ?04/17/20 ?Since our last visit, her cellcept was increased. Her ophthalmology note from 03/14/20 by Dr. Manuella Ghazi was reviewed. She reported visual abnormalities however her ocular exam shows stable inflammation so she was advised to continue her current dose of cellcept. She reports worsening shortness of breath and substernal chest pain. Worsens with exertion. Denies wheezing.  ? ?05/19/20 ?She has completed her steroids and feels it made a significant difference in the chest pressure she was feeling but her shortness of breath remains persistent. Her ophthalmology note from 05/16/20 by Dr. Manuella Ghazi was reviewed. Due to reported side effects, she has requested stopping taking immunosuppressants to see if these are the cause of her symptoms of fatigue, memory and tremor issues. Dr. Manuella Ghazi does want to resume immunosuppressants and has offered Humira as the next line in treatment. She reports hesitancy in starting any other medications at this time. ? ?06/09/20 ?She reports her vision is the same with floaters. Her fatigue and tremors  have improved off the cellcept however it remains persistent. She will have good days and bad days. Unclear if her memory is better off cellcept. She  had questions regarding Humira and if cellcept or methotrexate would be tried again. For her breathing she still has difficulty getting a full breath in and believes this contributes to her fatigue as well. ? ?09/12/20 ?Since our last visit she was started on Humira on 07/11/20. She is tolerating it well. She states that her symptoms of fatigue have improved off cellcept. On review of Neuro notes, she was started on Amantadine for progressive dyskinesia related to her Parkinsons. She is unsure if the inhaler helps but she is compliant with Incruse daily. She recently had covid-19 one month ago and has recovered with baseline shortness of breath. ? ?12/28/20 ?Since our last visit, she is compliant with her Humira. She is reports that she is still having some chest tightness and some difficulty breathing all the way. She was seen by Neurology and gabapentin increased for newly diagnosed small fiber neuropathy. She reports that her vision is still distorted and occurs in the day but worsens at night. Denies double vision or floaters. ? ?03/06/21 ?Since our last visit, she has completed her steroid taper with shortness of breath and neuropathy pain improved however not as effective in the past. She reports she has had worsening back pain which seems to affect her breathing and limiting her activity. She is unable to use her stationary bike. She is compliant with Humira. Ophthalmology noted by Dr. Manuella Ghazi on 01/16/21 reviewed with no signs of active inflammation on eye exam.  ? ?05/17/21 ?She reports that she is struggling with her lower extremity neuropathy which worsens her balance. She has to limit her activity and needing her husband more for support. She is having back pain and considering surgery. Her breathing is not improving either. Denies chest pain. Feels her back pain is contributing to her respiratory status due to being hunched over. Tinnitus is worsening. Her eyes seem to be showing "hallucinations," for example  seeing birds floating. It is difficult to read as well.  ? ? ?Social History: ?Never smoker ? ?Past Medical History:  ?Diagnosis Date  ? Abdominal pain, epigastric 09/18/2009  ? Anosmia 11/06/2010  ? ANXIETY 09/23/2006  ? BUNION, RIGHT FOOT 09/18/2009  ? CARPAL TUNNEL SYNDROME, BILATERAL 09/23/2006  ? DEPRESSION 09/23/2006  ? DIVERTICULOSIS, COLON 09/23/2006  ? GERD 05/24/2008  ? GLUCOSE INTOLERANCE 05/24/2008  ? Hemorrhoids   ? Hypercalcemia   ? HYPERLIPIDEMIA 09/23/2006  ? HYPERTENSION 09/23/2006  ? IBS 09/23/2006  ? Impaired glucose tolerance 11/04/2010  ? OSTEOPOROSIS 05/24/2008  ? 06/01/19-pt states osteopenia  ? Parkinson's disease ([place])   ? Restrictive lung disease 12/20/2013  ? Sarcoidosis 02/26/2019  ?   ? ?Outpatient Medications Prior to Visit  ?Medication Sig Dispense Refill  ? acetaminophen (TYLENOL) 500 MG tablet Take 1,000 mg by mouth every 6 (six) hours as needed for mild pain.    ? Adalimumab (HUMIRA PEN) 40 MG/0.4ML PNKT Inject 40 mg into the skin every 14 (fourteen) days. 6 each 0  ? Amantadine HCl 100 MG tablet Take 1 tablet (100 mg total) by mouth daily.    ? aspirin 81 MG tablet Take 81 mg by mouth daily.    ? carbidopa-levodopa (SINEMET IR) 25-100 MG tablet Take by mouth 3 (three) times daily. 1 tab in the morning and half tab at noon/4pm/bedtime    ? Carbidopa-Levodopa ER (SINEMET CR) 25-100 MG tablet  controlled release Take 1 tablet by mouth 2 (two) times daily.    ? dicyclomine (BENTYL) 20 MG tablet Take 20 mg by mouth 4 (four) times daily as needed for spasms.    ? diphenhydrAMINE (BENADRYL) 25 MG tablet Take 25 mg by mouth at bedtime as needed for sleep.    ? diphenhydramine-acetaminophen (TYLENOL PM) 25-500 MG TABS tablet Take 1 tablet by mouth at bedtime as needed.    ? folic acid (FOLVITE) 1 MG tablet TAKE 1 TABLET BY MOUTH EVERY DAY 90 tablet 0  ? gabapentin (NEURONTIN) 100 MG capsule Take by mouth as directed. Take 2 tabs in the morning, 1 noon/4pm/ 2 bedtime    ? rOPINIRole (REQUIP) 1 MG tablet Take  1 tablet (1 mg total) by mouth 3 (three) times daily. (Patient taking differently: Take 1 mg by mouth See admin instructions. Take 1 mg by mouth three times a day- 8 AM, 12 PM, and between 5-6 PM) 90 t

## 2021-05-18 LAB — COMPREHENSIVE METABOLIC PANEL
ALT: 8 U/L (ref 0–35)
AST: 23 U/L (ref 0–37)
Albumin: 4.5 g/dL (ref 3.5–5.2)
Alkaline Phosphatase: 57 U/L (ref 39–117)
BUN: 31 mg/dL — ABNORMAL HIGH (ref 6–23)
CO2: 25 mEq/L (ref 19–32)
Calcium: 10 mg/dL (ref 8.4–10.5)
Chloride: 103 mEq/L (ref 96–112)
Creatinine, Ser: 1.31 mg/dL — ABNORMAL HIGH (ref 0.40–1.20)
GFR: 40.67 mL/min — ABNORMAL LOW (ref >60.00)
Glucose, Bld: 103 mg/dL — ABNORMAL HIGH (ref 70–99)
Potassium: 4.5 mEq/L (ref 3.5–5.1)
Sodium: 138 mEq/L (ref 135–145)
Total Bilirubin: 0.5 mg/dL (ref 0.2–1.2)
Total Protein: 7.7 g/dL (ref 6.0–8.3)

## 2021-05-18 LAB — CBC WITH DIFFERENTIAL/PLATELET
Basophils Absolute: 0 10*3/uL (ref 0.0–0.1)
Basophils Relative: 0.6 % (ref 0.0–3.0)
Eosinophils Absolute: 0.1 10*3/uL (ref 0.0–0.7)
Eosinophils Relative: 1.8 % (ref 0.0–5.0)
HCT: 41.1 % (ref 36.0–46.0)
Hemoglobin: 13.6 g/dL (ref 12.0–15.0)
Lymphocytes Relative: 19.5 % (ref 12.0–46.0)
Lymphs Abs: 1 10*3/uL (ref 0.7–4.0)
MCHC: 33 g/dL (ref 30.0–36.0)
MCV: 92.8 fl (ref 78.0–100.0)
Monocytes Absolute: 0.5 10*3/uL (ref 0.1–1.0)
Monocytes Relative: 8.8 % (ref 3.0–12.0)
Neutro Abs: 3.6 10*3/uL (ref 1.4–7.7)
Neutrophils Relative %: 69.3 % (ref 43.0–77.0)
Platelets: 289 10*3/uL (ref 150.0–400.0)
RBC: 4.43 Mil/uL (ref 3.87–5.11)
RDW: 14.3 % (ref 11.5–15.5)
WBC: 5.2 10*3/uL (ref 4.0–10.5)

## 2021-05-28 DIAGNOSIS — N1832 Chronic kidney disease, stage 3b: Secondary | ICD-10-CM | POA: Diagnosis not present

## 2021-05-28 DIAGNOSIS — R202 Paresthesia of skin: Secondary | ICD-10-CM | POA: Diagnosis not present

## 2021-05-28 DIAGNOSIS — G20 Parkinson's disease: Secondary | ICD-10-CM | POA: Diagnosis not present

## 2021-05-28 DIAGNOSIS — D869 Sarcoidosis, unspecified: Secondary | ICD-10-CM | POA: Diagnosis not present

## 2021-05-28 DIAGNOSIS — J309 Allergic rhinitis, unspecified: Secondary | ICD-10-CM | POA: Diagnosis not present

## 2021-06-01 DIAGNOSIS — Z20822 Contact with and (suspected) exposure to covid-19: Secondary | ICD-10-CM | POA: Diagnosis not present

## 2021-06-05 DIAGNOSIS — R202 Paresthesia of skin: Secondary | ICD-10-CM | POA: Diagnosis not present

## 2021-06-05 DIAGNOSIS — Z23 Encounter for immunization: Secondary | ICD-10-CM | POA: Diagnosis not present

## 2021-06-07 DIAGNOSIS — Z1231 Encounter for screening mammogram for malignant neoplasm of breast: Secondary | ICD-10-CM | POA: Diagnosis not present

## 2021-06-07 DIAGNOSIS — Z20822 Contact with and (suspected) exposure to covid-19: Secondary | ICD-10-CM | POA: Diagnosis not present

## 2021-06-12 DIAGNOSIS — H3581 Retinal edema: Secondary | ICD-10-CM | POA: Diagnosis not present

## 2021-06-12 DIAGNOSIS — D8689 Sarcoidosis of other sites: Secondary | ICD-10-CM | POA: Diagnosis not present

## 2021-06-12 DIAGNOSIS — H4710 Unspecified papilledema: Secondary | ICD-10-CM | POA: Diagnosis not present

## 2021-06-12 DIAGNOSIS — H44113 Panuveitis, bilateral: Secondary | ICD-10-CM | POA: Diagnosis not present

## 2021-06-12 DIAGNOSIS — Z79899 Other long term (current) drug therapy: Secondary | ICD-10-CM | POA: Diagnosis not present

## 2021-06-12 DIAGNOSIS — H30033 Focal chorioretinal inflammation, peripheral, bilateral: Secondary | ICD-10-CM | POA: Diagnosis not present

## 2021-06-15 ENCOUNTER — Encounter: Payer: Self-pay | Admitting: Pulmonary Disease

## 2021-06-15 ENCOUNTER — Other Ambulatory Visit: Payer: Self-pay | Admitting: Pulmonary Disease

## 2021-06-15 DIAGNOSIS — D869 Sarcoidosis, unspecified: Secondary | ICD-10-CM

## 2021-06-15 DIAGNOSIS — H3090 Unspecified chorioretinal inflammation, unspecified eye: Secondary | ICD-10-CM

## 2021-06-15 MED ORDER — HUMIRA (2 PEN) 40 MG/0.4ML ~~LOC~~ AJKT: 40.0000 mg | AUTO-INJECTOR | SUBCUTANEOUS | 0 refills | Status: DC

## 2021-06-21 ENCOUNTER — Other Ambulatory Visit (INDEPENDENT_AMBULATORY_CARE_PROVIDER_SITE_OTHER): Payer: Medicare Other

## 2021-06-21 DIAGNOSIS — D869 Sarcoidosis, unspecified: Secondary | ICD-10-CM

## 2021-06-21 DIAGNOSIS — Z79899 Other long term (current) drug therapy: Secondary | ICD-10-CM

## 2021-06-21 LAB — CBC WITH DIFFERENTIAL/PLATELET
Basophils Absolute: 0 10*3/uL (ref 0.0–0.1)
Basophils Relative: 0.9 % (ref 0.0–3.0)
Eosinophils Absolute: 0.1 10*3/uL (ref 0.0–0.7)
Eosinophils Relative: 2 % (ref 0.0–5.0)
HCT: 40.7 % (ref 36.0–46.0)
Hemoglobin: 13.6 g/dL (ref 12.0–15.0)
Lymphocytes Relative: 19.1 % (ref 12.0–46.0)
Lymphs Abs: 0.8 10*3/uL (ref 0.7–4.0)
MCHC: 33.3 g/dL (ref 30.0–36.0)
MCV: 93.7 fl (ref 78.0–100.0)
Monocytes Absolute: 0.5 10*3/uL (ref 0.1–1.0)
Monocytes Relative: 10.6 % (ref 3.0–12.0)
Neutro Abs: 2.9 10*3/uL (ref 1.4–7.7)
Neutrophils Relative %: 67.4 % (ref 43.0–77.0)
Platelets: 274 10*3/uL (ref 150.0–400.0)
RBC: 4.35 Mil/uL (ref 3.87–5.11)
RDW: 16.1 % — ABNORMAL HIGH (ref 11.5–15.5)
WBC: 4.4 10*3/uL (ref 4.0–10.5)

## 2021-06-21 LAB — COMPREHENSIVE METABOLIC PANEL
ALT: 10 U/L (ref 0–35)
AST: 21 U/L (ref 0–37)
Albumin: 4.5 g/dL (ref 3.5–5.2)
Alkaline Phosphatase: 62 U/L (ref 39–117)
BUN: 33 mg/dL — ABNORMAL HIGH (ref 6–23)
CO2: 27 mEq/L (ref 19–32)
Calcium: 10 mg/dL (ref 8.4–10.5)
Chloride: 101 mEq/L (ref 96–112)
Creatinine, Ser: 1.39 mg/dL — ABNORMAL HIGH (ref 0.40–1.20)
GFR: 37.85 mL/min — ABNORMAL LOW (ref >60.00)
Glucose, Bld: 88 mg/dL (ref 70–99)
Potassium: 4 mEq/L (ref 3.5–5.1)
Sodium: 137 mEq/L (ref 135–145)
Total Bilirubin: 0.7 mg/dL (ref 0.2–1.2)
Total Protein: 7.7 g/dL (ref 6.0–8.3)

## 2021-07-04 ENCOUNTER — Other Ambulatory Visit: Payer: Self-pay | Admitting: Pulmonary Disease

## 2021-07-04 DIAGNOSIS — D869 Sarcoidosis, unspecified: Secondary | ICD-10-CM

## 2021-07-04 DIAGNOSIS — Z79899 Other long term (current) drug therapy: Secondary | ICD-10-CM

## 2021-07-11 DIAGNOSIS — M5459 Other low back pain: Secondary | ICD-10-CM | POA: Diagnosis not present

## 2021-07-12 DIAGNOSIS — R41841 Cognitive communication deficit: Secondary | ICD-10-CM | POA: Diagnosis not present

## 2021-07-12 DIAGNOSIS — G629 Polyneuropathy, unspecified: Secondary | ICD-10-CM | POA: Diagnosis not present

## 2021-07-12 DIAGNOSIS — D869 Sarcoidosis, unspecified: Secondary | ICD-10-CM | POA: Diagnosis not present

## 2021-07-16 DIAGNOSIS — Z961 Presence of intraocular lens: Secondary | ICD-10-CM | POA: Diagnosis not present

## 2021-07-16 DIAGNOSIS — Z48810 Encounter for surgical aftercare following surgery on the sense organs: Secondary | ICD-10-CM | POA: Diagnosis not present

## 2021-07-19 ENCOUNTER — Other Ambulatory Visit (INDEPENDENT_AMBULATORY_CARE_PROVIDER_SITE_OTHER): Payer: Medicare Other

## 2021-07-19 DIAGNOSIS — Z5181 Encounter for therapeutic drug level monitoring: Secondary | ICD-10-CM | POA: Diagnosis not present

## 2021-07-19 DIAGNOSIS — Z79899 Other long term (current) drug therapy: Secondary | ICD-10-CM | POA: Diagnosis not present

## 2021-07-19 DIAGNOSIS — G20 Parkinson's disease: Secondary | ICD-10-CM | POA: Diagnosis not present

## 2021-07-19 DIAGNOSIS — G629 Polyneuropathy, unspecified: Secondary | ICD-10-CM | POA: Diagnosis not present

## 2021-07-19 DIAGNOSIS — D869 Sarcoidosis, unspecified: Secondary | ICD-10-CM

## 2021-07-19 DIAGNOSIS — D84821 Immunodeficiency due to drugs: Secondary | ICD-10-CM | POA: Diagnosis not present

## 2021-07-19 LAB — CBC WITH DIFFERENTIAL/PLATELET
Basophils Absolute: 0 10*3/uL (ref 0.0–0.1)
Basophils Relative: 0.7 % (ref 0.0–3.0)
Eosinophils Absolute: 0.1 10*3/uL (ref 0.0–0.7)
Eosinophils Relative: 1 % (ref 0.0–5.0)
HCT: 40.4 % (ref 36.0–46.0)
Hemoglobin: 13.4 g/dL (ref 12.0–15.0)
Lymphocytes Relative: 13.8 % (ref 12.0–46.0)
Lymphs Abs: 0.9 10*3/uL (ref 0.7–4.0)
MCHC: 33.2 g/dL (ref 30.0–36.0)
MCV: 95.2 fl (ref 78.0–100.0)
Monocytes Absolute: 0.5 10*3/uL (ref 0.1–1.0)
Monocytes Relative: 8.1 % (ref 3.0–12.0)
Neutro Abs: 4.7 10*3/uL (ref 1.4–7.7)
Neutrophils Relative %: 76.4 % (ref 43.0–77.0)
Platelets: 270 10*3/uL (ref 150.0–400.0)
RBC: 4.24 Mil/uL (ref 3.87–5.11)
RDW: 15.9 % — ABNORMAL HIGH (ref 11.5–15.5)
WBC: 6.2 10*3/uL (ref 4.0–10.5)

## 2021-07-19 LAB — COMPREHENSIVE METABOLIC PANEL
ALT: 7 U/L (ref 0–35)
AST: 30 U/L (ref 0–37)
Albumin: 4.3 g/dL (ref 3.5–5.2)
Alkaline Phosphatase: 66 U/L (ref 39–117)
BUN: 22 mg/dL (ref 6–23)
CO2: 28 mEq/L (ref 19–32)
Calcium: 10.1 mg/dL (ref 8.4–10.5)
Chloride: 104 mEq/L (ref 96–112)
Creatinine, Ser: 1.14 mg/dL (ref 0.40–1.20)
GFR: 47.99 mL/min — ABNORMAL LOW (ref >60.00)
Glucose, Bld: 93 mg/dL (ref 70–99)
Potassium: 4.1 mEq/L (ref 3.5–5.1)
Sodium: 139 mEq/L (ref 135–145)
Total Bilirubin: 0.5 mg/dL (ref 0.2–1.2)
Total Protein: 7.5 g/dL (ref 6.0–8.3)

## 2021-07-21 DIAGNOSIS — M5459 Other low back pain: Secondary | ICD-10-CM | POA: Diagnosis not present

## 2021-07-21 DIAGNOSIS — M5416 Radiculopathy, lumbar region: Secondary | ICD-10-CM | POA: Diagnosis not present

## 2021-07-23 ENCOUNTER — Encounter: Payer: Self-pay | Admitting: Pulmonary Disease

## 2021-07-23 ENCOUNTER — Ambulatory Visit (INDEPENDENT_AMBULATORY_CARE_PROVIDER_SITE_OTHER): Payer: Medicare Other | Admitting: Pulmonary Disease

## 2021-07-23 DIAGNOSIS — Z79899 Other long term (current) drug therapy: Secondary | ICD-10-CM

## 2021-07-23 DIAGNOSIS — D869 Sarcoidosis, unspecified: Secondary | ICD-10-CM

## 2021-07-23 MED ORDER — INCRUSE ELLIPTA 62.5 MCG/ACT IN AEPB: 1.0000 | INHALATION_SPRAY | Freq: Every day | RESPIRATORY_TRACT | 2 refills | Status: DC

## 2021-07-23 MED ORDER — BIOTIN 1 MG PO CAPS: 1.0000 mg | ORAL_CAPSULE | Freq: Every day | ORAL | 5 refills | Status: AC

## 2021-07-23 MED ORDER — METHOTREXATE 2.5 MG PO TABS: 10.0000 mg | ORAL_TABLET | ORAL | 0 refills | Status: DC

## 2021-07-23 MED ORDER — FOLIC ACID 1 MG PO TABS: 1.0000 mg | ORAL_TABLET | Freq: Every day | ORAL | 0 refills | Status: DC

## 2021-07-24 ENCOUNTER — Encounter: Payer: Self-pay | Admitting: Pulmonary Disease

## 2021-07-25 DIAGNOSIS — G319 Degenerative disease of nervous system, unspecified: Secondary | ICD-10-CM | POA: Diagnosis not present

## 2021-07-25 DIAGNOSIS — D869 Sarcoidosis, unspecified: Secondary | ICD-10-CM | POA: Diagnosis not present

## 2021-07-27 DIAGNOSIS — M545 Low back pain, unspecified: Secondary | ICD-10-CM | POA: Diagnosis not present

## 2021-08-01 ENCOUNTER — Telehealth: Payer: Self-pay | Admitting: Pulmonary Disease

## 2021-08-06 ENCOUNTER — Other Ambulatory Visit: Payer: Self-pay | Admitting: Pulmonary Disease

## 2021-08-06 ENCOUNTER — Telehealth: Payer: Self-pay | Admitting: Pulmonary Disease

## 2021-08-06 NOTE — Telephone Encounter (Signed)
Addressed by Piotr Christopher and Mandi. Sent to Dr Loanne Drilling and Surg clearance pool. Nothing further needed

## 2021-08-06 NOTE — Telephone Encounter (Signed)
Fax received from Dr. Melina Schools with [facility] to perform a lumbar fusion on patient.  Patient needs surgery clearance. Surgery is pending. Patient was seen on 07/23/21. Office protocol is a risk assessment can be sent to surgeon if patient has been seen in 60 days or less.   Sending to Dr. Loanne Drilling for risk assessment or recommendations if patient needs to be seen in office prior to surgical procedure.

## 2021-08-07 NOTE — Telephone Encounter (Signed)
ASSESSMENT AND RECOMMENDATIONS for [place] for lumbar spinal fusion surgery.   Preoperative Risk Calculation: The features of this patient's history that contribute to the pulmonary risk assessment include: Age, General anesthesia , thoracic surgery, surgery >2 hours.  This patient has a low risk of post-operative pulmonary complications by ARISCAT Index.  The absolute assessment of risk/benefit of the procedure is deferred to the primary team's evaluation.  - Patient's Estimated risk of postoperative respiratory failure is 19 points, 1.6% based on the ARISCAT Index.   0 to 25 points: Low risk: 1.6% pulmonary complication rate  26 to 44 points: Intermediate risk: 10.9% pulmonary complication rate  45 to 123 points: High risk: 60.4% pulmonary complication rate  Postoperative respiratory failure (PRF) is considered as failure to wean from mechanical ventilation within 48 hours of surgery or unplanned intubation/reintubation postoperatively. The validated risk calculator provides a risk estimate of PRF and is anticipated to aid in surgical decision-making and informed patient consent.  However risk can be accepted given the potential benefit of this intervention and it is not prohibitive.  RECOMMENDATIONS:  In order to minimize the risk of complications and optimize pulmonary status, we recommend the following: - Encourage aggressive incentive spirometry hourly both peri-operatively and post-operatively as tolerated  - Early ambulation and physical therapy as tolerated post-operatively - Adequate pain control especially in the setting of abdominal and thoracic surgery - Bronchodilators as needed for wheezing or shortness of breath - Intraoperatively keep OR time to the shortest as possible   ARISCAT: Mazo et al. Anesthesiology 2014; [phone]  Lenice Llamas, MD Pulmonary and [place] 08/07/2021 2:45 PM Pager: see AMION  If no response to  pager, please call critical care on call (see AMION) until 7pm After 7:00 pm call Elink

## 2021-08-09 NOTE — Telephone Encounter (Signed)
OV notes and clearance form have been faxed back to [facility]. Nothing further needed at this time. ?

## 2021-08-10 DIAGNOSIS — R0600 Dyspnea, unspecified: Secondary | ICD-10-CM | POA: Diagnosis not present

## 2021-08-10 DIAGNOSIS — I10 Essential (primary) hypertension: Secondary | ICD-10-CM | POA: Diagnosis not present

## 2021-08-10 DIAGNOSIS — E785 Hyperlipidemia, unspecified: Secondary | ICD-10-CM | POA: Diagnosis not present

## 2021-08-10 DIAGNOSIS — N1832 Chronic kidney disease, stage 3b: Secondary | ICD-10-CM | POA: Diagnosis not present

## 2021-08-10 DIAGNOSIS — D869 Sarcoidosis, unspecified: Secondary | ICD-10-CM | POA: Diagnosis not present

## 2021-08-10 DIAGNOSIS — G20 Parkinson's disease: Secondary | ICD-10-CM | POA: Diagnosis not present

## 2021-08-10 DIAGNOSIS — Z01818 Encounter for other preprocedural examination: Secondary | ICD-10-CM | POA: Diagnosis not present

## 2021-08-21 ENCOUNTER — Other Ambulatory Visit: Payer: Self-pay | Admitting: Pulmonary Disease

## 2021-08-21 DIAGNOSIS — D869 Sarcoidosis, unspecified: Secondary | ICD-10-CM

## 2021-08-21 DIAGNOSIS — H3090 Unspecified chorioretinal inflammation, unspecified eye: Secondary | ICD-10-CM

## 2021-08-21 MED ORDER — HUMIRA (2 PEN) 40 MG/0.4ML ~~LOC~~ AJKT: 40.0000 mg | AUTO-INJECTOR | SUBCUTANEOUS | 1 refills | Status: DC

## 2021-09-20 ENCOUNTER — Other Ambulatory Visit: Payer: Self-pay | Admitting: *Deleted

## 2021-09-20 ENCOUNTER — Other Ambulatory Visit (INDEPENDENT_AMBULATORY_CARE_PROVIDER_SITE_OTHER): Payer: Medicare Other

## 2021-09-20 DIAGNOSIS — D869 Sarcoidosis, unspecified: Secondary | ICD-10-CM | POA: Diagnosis not present

## 2021-09-20 DIAGNOSIS — Z79899 Other long term (current) drug therapy: Secondary | ICD-10-CM

## 2021-09-21 LAB — COMPREHENSIVE METABOLIC PANEL
ALT: 7 U/L (ref 0–35)
AST: 21 U/L (ref 0–37)
Albumin: 4.5 g/dL (ref 3.5–5.2)
Alkaline Phosphatase: 64 U/L (ref 39–117)
BUN: 25 mg/dL — ABNORMAL HIGH (ref 6–23)
CO2: 27 mEq/L (ref 19–32)
Calcium: 10.2 mg/dL (ref 8.4–10.5)
Chloride: 102 mEq/L (ref 96–112)
Creatinine, Ser: 1.41 mg/dL — ABNORMAL HIGH (ref 0.40–1.20)
GFR: 37.14 mL/min — ABNORMAL LOW (ref >60.00)
Glucose, Bld: 97 mg/dL (ref 70–99)
Potassium: 4.5 mEq/L (ref 3.5–5.1)
Sodium: 138 mEq/L (ref 135–145)
Total Bilirubin: 0.5 mg/dL (ref 0.2–1.2)
Total Protein: 7.7 g/dL (ref 6.0–8.3)

## 2021-09-21 LAB — CBC WITH DIFFERENTIAL/PLATELET
Basophils Absolute: 0 10*3/uL (ref 0.0–0.1)
Basophils Relative: 0.9 % (ref 0.0–3.0)
Eosinophils Absolute: 0.1 10*3/uL (ref 0.0–0.7)
Eosinophils Relative: 1.7 % (ref 0.0–5.0)
HCT: 42.9 % (ref 36.0–46.0)
Hemoglobin: 14.1 g/dL (ref 12.0–15.0)
Lymphocytes Relative: 15 % (ref 12.0–46.0)
Lymphs Abs: 0.8 10*3/uL (ref 0.7–4.0)
MCHC: 32.9 g/dL (ref 30.0–36.0)
MCV: 96.9 fl (ref 78.0–100.0)
Monocytes Absolute: 0.4 10*3/uL (ref 0.1–1.0)
Monocytes Relative: 6.8 % (ref 3.0–12.0)
Neutro Abs: 4.1 10*3/uL (ref 1.4–7.7)
Neutrophils Relative %: 75.6 % (ref 43.0–77.0)
Platelets: 264 10*3/uL (ref 150.0–400.0)
RBC: 4.42 Mil/uL (ref 3.87–5.11)
RDW: 13.9 % (ref 11.5–15.5)
WBC: 5.4 10*3/uL (ref 4.0–10.5)

## 2021-09-25 LAB — VITAMIN D 1,25 DIHYDROXY
Vitamin D 1, 25 (OH)2 Total: 54 pg/mL (ref 18–72)
Vitamin D2 1, 25 (OH)2: 17 pg/mL
Vitamin D3 1, 25 (OH)2: 37 pg/mL

## 2021-09-28 ENCOUNTER — Ambulatory Visit
Admission: EM | Admit: 2021-09-28 | Discharge: 2021-09-28 | Disposition: A | Discharge status: Home / Self Care | Payer: Medicare Other | Attending: Urgent Care | Admitting: Urgent Care

## 2021-09-28 DIAGNOSIS — R410 Disorientation, unspecified: Secondary | ICD-10-CM | POA: Insufficient documentation

## 2021-09-28 DIAGNOSIS — N183 Chronic kidney disease, stage 3 unspecified: Secondary | ICD-10-CM | POA: Diagnosis not present

## 2021-09-28 DIAGNOSIS — N3001 Acute cystitis with hematuria: Secondary | ICD-10-CM | POA: Insufficient documentation

## 2021-09-28 LAB — POCT URINALYSIS DIP (MANUAL ENTRY)
Bilirubin, UA: NEGATIVE
Glucose, UA: NEGATIVE mg/dL
Nitrite, UA: POSITIVE — AB
Protein Ur, POC: 30 mg/dL — AB
Spec Grav, UA: >=1.03 — AB
Urobilinogen, UA: 0.2 U/dL
pH, UA: 5.5

## 2021-09-28 MED ORDER — CEPHALEXIN 500 MG PO CAPS
500.0000 mg | ORAL_CAPSULE | Freq: Two times a day (BID) | ORAL | 0 refills | Status: DC
Start: 2021-09-28 — End: 2021-10-31

## 2021-09-28 NOTE — Discharge Instructions (Signed)

## 2021-09-28 NOTE — ED Triage Notes (Signed)
Pt. Is stating she is having an increase in confusion, vision complications and generalized fatigue. Pt. Has hx of parkinson's and sarcoidosis. Pt. Is concerned for UTI but is not presenting with urinary symptoms.

## 2021-09-28 NOTE — ED Provider Notes (Signed)
[facility]  Note:  This document was prepared using Systems analyst and may include unintentional dictation errors.  MRN: [id number] DOB: July 28, 1950  Subjective:   Destiny Roth is a 71 y.o. female presenting for 2-day history of acute onset feeling confused, having changes in her vision, feeling fatigued.  Patient has a history of Parkinson's and sarcoidosis but those conditions generally feel different for her.  She also has a history of memory impairments, anosmia, visual disturbances, generalized weakness, vertigo but all of these are stable per patient.  Denies dysuria, urinary frequency, hematuria, fevers.  No current facility-administered medications for this encounter.  Current Outpatient Medications:    acetaminophen (TYLENOL) 500 MG tablet, Take 1,000 mg by mouth every 6 (six) hours as needed for mild pain., Disp: , Rfl:    Adalimumab (HUMIRA PEN) 40 MG/0.4ML PNKT, Inject 40 mg into the skin every 14 (fourteen) days., Disp: 6 each, Rfl: 1   Amantadine HCl 100 MG tablet, Take 1 tablet (100 mg total) by mouth daily., Disp: , Rfl:    aspirin 81 MG tablet, Take 81 mg by mouth daily., Disp: , Rfl:    Biotin 1 MG CAPS, Take 1 mg by mouth daily at 2 PM., Disp: 30 capsule, Rfl: 5   carbidopa-levodopa (SINEMET IR) 25-100 MG tablet, Take by mouth 3 (three) times daily. 1 tab in the morning and half tab at noon/4pm/bedtime, Disp: , Rfl:    Carbidopa-Levodopa ER (SINEMET CR) 25-100 MG tablet controlled release, Take 1 tablet by mouth 2 (two) times daily., Disp: , Rfl:    dicyclomine (BENTYL) 20 MG tablet, Take 20 mg by mouth 4 (four) times daily as needed for spasms., Disp: , Rfl:    diphenhydrAMINE (BENADRYL) 25 MG tablet, Take 25 mg by mouth at bedtime as needed for sleep., Disp: , Rfl:    diphenhydramine-acetaminophen (TYLENOL PM) 25-500 MG TABS tablet, Take 1 tablet by mouth at bedtime as needed., Disp: , Rfl:    folic acid (FOLVITE) 1 MG  tablet, Take 1 tablet (1 mg total) by mouth daily., Disp: 90 tablet, Rfl: 0   gabapentin (NEURONTIN) 100 MG capsule, Take by mouth as directed. Take 2 tabs in the morning, 1 noon/4pm/ 2 bedtime, Disp: , Rfl:    methotrexate (RHEUMATREX) 2.5 MG tablet, Take 4 tablets (10 mg total) by mouth once a week. Caution:Chemotherapy. Protect from light., Disp: 48 tablet, Rfl: 0   rOPINIRole (REQUIP) 1 MG tablet, Take 1 tablet (1 mg total) by mouth 3 (three) times daily. (Patient taking differently: Take 1 mg by mouth See admin instructions. Take 1 mg by mouth three times a day- 8 AM, 12 PM, and between 5-6 PM), Disp: 90 tablet, Rfl: 0   rosuvastatin (CRESTOR) 10 MG tablet, Take 1 tablet (10 mg total) by mouth daily., Disp: 90 tablet, Rfl: 3   senna (SENOKOT) 8.6 MG TABS tablet, Take 1 tablet (8.6 mg total) by mouth 2 (two) times daily., Disp: 30 tablet, Rfl: 0   sertraline (ZOLOFT) 100 MG tablet, Take 100 mg by mouth daily., Disp: , Rfl:    umeclidinium bromide (INCRUSE ELLIPTA) 62.5 MCG/ACT AEPB, Inhale 1 puff into the lungs daily., Disp: 30 each, Rfl: 2   Allergies  Allergen Reactions   Myrbetriq [Mirabegron] Other (See Comments)    SEVERE muscle and joint pain   Atorvastatin Other (See Comments)    Muscle pain Muscle pain myalgia   Zocor [Simvastatin] Other (See Comments)    Muscle  pain    Past Medical History:  Diagnosis Date   Abdominal pain, epigastric 09/18/2009   Anosmia 11/06/2010   ANXIETY 09/23/2006   BUNION, RIGHT FOOT 09/18/2009   CARPAL TUNNEL SYNDROME, BILATERAL 09/23/2006   DEPRESSION 09/23/2006   DIVERTICULOSIS, COLON 09/23/2006   GERD 05/24/2008   GLUCOSE INTOLERANCE 05/24/2008   Hemorrhoids    Hypercalcemia    HYPERLIPIDEMIA 09/23/2006   HYPERTENSION 09/23/2006   IBS 09/23/2006   Impaired glucose tolerance 11/04/2010   OSTEOPOROSIS 05/24/2008   06/01/19-pt states osteopenia   Parkinson's disease ([place])    Restrictive lung disease 12/20/2013   Sarcoidosis 02/26/2019     Past  Surgical History:  Procedure Laterality Date   CARDIAC CATHETERIZATION     CATARACT EXTRACTION     COLONOSCOPY  2008   COLONOSCOPY W/ BIOPSIES  2008   Dr. Collene Mares -negative random bxs   ESOPHAGOGASTRODUODENOSCOPY  2008   Dr. Harriette Bouillon duodenal ulcer   FOOT SURGERY Bilateral 2015   LEFT AND RIGHT HEART CATHETERIZATION WITH CORONARY ANGIOGRAM N/A 11/09/2013   Procedure: LEFT AND RIGHT HEART CATHETERIZATION WITH CORONARY ANGIOGRAM;  Surgeon: Peter M Martinique, MD;  Location: [facility] CATH LAB;  Service: Cardiovascular;  Laterality: N/A;   nasal septum repair     s/p bilat CTS     s/p fibroid ablation     s/p ganglion cyst right wrist     s/p right thumb trigger finger surgury     SHOULDER SURGERY Right 2015   TUBAL LIGATION     UPPER GASTROINTESTINAL ENDOSCOPY      Family History  Problem Relation Age of Onset   Peripheral vascular disease Mother        with bilat amputations   Heart attack Mother    Hypertension Father    Heart disease Father 44   Hypertension Brother    Heart attack Brother    Hypertension Brother    Hypertension Brother    Colon cancer Neg Hx    Colon polyps Neg Hx    Esophageal cancer Neg Hx    Rectal cancer Neg Hx    Stomach cancer Neg Hx     Social History   Tobacco Use   Smoking status: Never    Passive exposure: Yes   Smokeless tobacco: Never  Vaping Use   Vaping Use: Never used  Substance Use Topics   Alcohol use: Not Currently    Comment: Occ   Drug use: No    ROS   Objective:   Vitals: BP 121/82   Pulse 99   Temp 98.4 F (36.9 C) (Oral)   Resp 16   SpO2 96%   Physical Exam Constitutional:      General: She is not in acute distress.    Appearance: Normal appearance. She is well-developed. She is not ill-appearing, toxic-appearing or diaphoretic.  HENT:     Head: Normocephalic and atraumatic.     Nose: Nose normal.     Mouth/Throat:     Mouth: Mucous membranes are moist.  Eyes:     General: No scleral icterus.       Right eye:  No discharge.        Left eye: No discharge.     Extraocular Movements: Extraocular movements intact.     Conjunctiva/sclera: Conjunctivae normal.  Cardiovascular:     Rate and Rhythm: Normal rate.  Pulmonary:     Effort: Pulmonary effort is normal.  Abdominal:     General: Bowel sounds are normal. There is no distension.  Palpations: Abdomen is soft. There is no mass.     Tenderness: There is no abdominal tenderness. There is no right CVA tenderness, left CVA tenderness, guarding or rebound.  Skin:    General: Skin is warm and dry.  Neurological:     General: No focal deficit present.     Mental Status: She is alert and oriented to person, place, and time.  Psychiatric:        Mood and Affect: Mood normal.        Behavior: Behavior normal.        Thought Content: Thought content normal.        Judgment: Judgment normal.     Results for orders placed or performed during the hospital encounter of 09/28/21 (from the past 24 hour(s))  POCT urinalysis dipstick     Status: Abnormal   Collection Time: 09/28/21  6:07 PM  Result Value Ref Range   Color, UA yellow yellow   Clarity, UA cloudy (A) clear   Glucose, UA negative negative mg/dL   Bilirubin, UA negative negative   Ketones, POC UA trace (5) (A) negative mg/dL   Spec Grav, UA >=1.030 (A) 1.010 - 1.025   Blood, UA trace-lysed (A) negative   pH, UA 5.5 5.0 - 8.0   Protein Ur, POC =30 (A) negative mg/dL   Urobilinogen, UA 0.2 0.2 or 1.0 E.U./dL   Nitrite, UA Positive (A) Negative   Leukocytes, UA Large (3+) (A) Negative    Assessment and Plan :   PDMP not reviewed this encounter.  1. Acute cystitis with hematuria   2. Subacute confusional state   3. Stage 3 chronic kidney disease, unspecified whether stage 3a or 3b CKD (HCC)    Creatinine clearance calculated at 57m/min - 366mmin and therefore she is to start Keflex to cover for acute cystitis, urine culture pending.  Recommended aggressive hydration, limiting  urinary irritants. Counseled patient on potential for adverse effects with medications prescribed/recommended today, ER and return-to-clinic precautions discussed, patient verbalized understanding.    MaJaynee EaglesPAVermont9/01/23 1824

## 2021-09-30 ENCOUNTER — Other Ambulatory Visit: Payer: Self-pay | Admitting: Internal Medicine

## 2021-10-01 LAB — URINE CULTURE: Culture: >=100000 — AB

## 2021-10-09 ENCOUNTER — Ambulatory Visit (HOSPITAL_COMMUNITY): Payer: Self-pay | Admitting: Orthopedic Surgery

## 2021-10-10 ENCOUNTER — Other Ambulatory Visit: Payer: Self-pay

## 2021-10-14 ENCOUNTER — Other Ambulatory Visit: Payer: Self-pay | Admitting: Pulmonary Disease

## 2021-10-16 ENCOUNTER — Other Ambulatory Visit: Payer: Medicare Other

## 2021-10-16 DIAGNOSIS — R5383 Other fatigue: Secondary | ICD-10-CM | POA: Diagnosis not present

## 2021-10-16 DIAGNOSIS — M79641 Pain in right hand: Secondary | ICD-10-CM | POA: Diagnosis not present

## 2021-10-16 DIAGNOSIS — M2560 Stiffness of unspecified joint, not elsewhere classified: Secondary | ICD-10-CM | POA: Diagnosis not present

## 2021-10-16 DIAGNOSIS — M79642 Pain in left hand: Secondary | ICD-10-CM | POA: Diagnosis not present

## 2021-10-16 DIAGNOSIS — Z79899 Other long term (current) drug therapy: Secondary | ICD-10-CM | POA: Diagnosis not present

## 2021-10-16 DIAGNOSIS — Z6827 Body mass index (BMI) 27.0-27.9, adult: Secondary | ICD-10-CM | POA: Diagnosis not present

## 2021-10-16 DIAGNOSIS — M1991 Primary osteoarthritis, unspecified site: Secondary | ICD-10-CM | POA: Diagnosis not present

## 2021-10-16 DIAGNOSIS — E663 Overweight: Secondary | ICD-10-CM | POA: Diagnosis not present

## 2021-10-16 DIAGNOSIS — D869 Sarcoidosis, unspecified: Secondary | ICD-10-CM | POA: Diagnosis not present

## 2021-10-16 DIAGNOSIS — M2540 Effusion, unspecified joint: Secondary | ICD-10-CM | POA: Diagnosis not present

## 2021-10-18 NOTE — Progress Notes (Addendum)
Surgical Instructions    Your procedure is scheduled on Monday October 2.  Report to [facility] Main Entrance "A" at 5:30 A.M., then check in with the Admitting office.  Call this number if you have problems the morning of surgery:  [phone]   If you have any questions prior to your surgery date call [phone]: Open Monday-Friday 8am-4pm    Remember:  Do not eat or drink anything after midnight the night before your surgery.     Take these medicines the morning of surgery with A SIP OF WATER:  Amantadine HCl  carbidopa-levodopa (SINEMET IR) Carbidopa-Levodopa ER (SINEMET CR) fluticasone (FLONASE) gabapentin (NEURONTIN)  rOPINIRole (REQUIP)  rosuvastatin (CRESTOR) sertraline (ZOLOFT)  umeclidinium bromide (INCRUSE ELLIPTA)  If needed you may take: acetaminophen (TYLENOL Carboxymethylcellulose Sod PF 0.5 % SOLN  Follow your surgeon's instructions for when to stop Aspirin. If no instructions were given you need to call your surgeon's office and ask for instructions.   As of today, STOP taking any Aleve, Naproxen, Ibuprofen, Motrin, Advil, Goody's, BC's, all herbal medications, fish oil, and all vitamins.           Do not wear jewelry or makeup. Do not wear lotions, powders, perfumes/cologne or deodorant. Do not shave 48 hours prior to surgery.  Men may shave face and neck. Do not bring valuables to the hospital. Do not wear nail polish, gel polish, artificial nails, or any other type of covering on natural nails (fingers and toes) If you have artificial nails or gel coating that need to be removed by a nail salon, please have this removed prior to surgery. Artificial nails or gel coating may interfere with anesthesia's ability to adequately monitor your vital signs.  [facility] is not responsible for any belongings or valuables.    Do NOT Smoke (Tobacco/Vaping)  24 hours prior to your procedure  If you use a CPAP at night, you may bring your mask for your overnight  stay.   Contacts, glasses, hearing aids, dentures or partials may not be worn into surgery, please bring cases for these belongings   For patients admitted to the hospital, discharge time will be determined by your treatment team.   Patients discharged the day of surgery will not be allowed to drive home, and someone needs to stay with them for 24 hours.   SURGICAL WAITING ROOM VISITATION Patients having surgery or a procedure may have no more than 2 support people in the waiting area - these visitors may rotate.   Children under the age of 76 must have an adult with them who is not the patient. If the patient needs to stay at the hospital during part of their recovery, the visitor guidelines for inpatient rooms apply. Pre-op nurse will coordinate an appropriate time for 1 support person to accompany patient in pre-op.  This support person may not rotate.   Please refer to the [facility] website for the visitor guidelines for Inpatients (after your surgery is over and you are in a regular room).    Special instructions:    Oral Hygiene is also important to reduce your risk of infection.  Remember - BRUSH YOUR TEETH THE MORNING OF SURGERY WITH YOUR REGULAR TOOTHPASTE   [facility]- Preparing For Surgery  Before surgery, you can play an important role. Because skin is not sterile, your skin needs to be as free of germs as possible. You can reduce the number of germs on your skin by washing with CHG (chlorahexidine gluconate) Soap  before surgery.  CHG is an antiseptic cleaner which kills germs and bonds with the skin to continue killing germs even after washing.     Please do not use if you have an allergy to CHG or antibacterial soaps. If your skin becomes reddened/irritated stop using the CHG.  Do not shave (including legs and underarms) for at least 48 hours prior to first CHG shower. It is OK to shave your face.  Please follow these instructions carefully.     Shower the NIGHT  BEFORE SURGERY and the MORNING OF SURGERY with CHG Soap.   If you chose to wash your hair, wash your hair first as usual with your normal shampoo. After you shampoo, rinse your hair and body thoroughly to remove the shampoo.  Then [employer] and genitals (private parts) with your normal soap and rinse thoroughly to remove soap.  After that Use CHG Soap as you would any other liquid soap. You can apply CHG directly to the skin and wash gently with a scrungie or a clean washcloth.   Apply the CHG Soap to your body ONLY FROM THE NECK DOWN.  Do not use on open wounds or open sores. Avoid contact with your eyes, ears, mouth and genitals (private parts). Wash Face and genitals (private parts)  with your normal soap.   Wash thoroughly, paying special attention to the area where your surgery will be performed.  Thoroughly rinse your body with warm water from the neck down.  DO NOT shower/wash with your normal soap after using and rinsing off the CHG Soap.  Pat yourself dry with a CLEAN TOWEL.  Wear CLEAN PAJAMAS to bed the night before surgery  Place CLEAN SHEETS on your bed the night before your surgery  DO NOT SLEEP WITH PETS.   Day of Surgery:  Take a shower with CHG soap. Wear Clean/Comfortable clothing the morning of surgery Do not apply any deodorants/lotions.   Remember to brush your teeth WITH YOUR REGULAR TOOTHPASTE.    If you received a COVID test during your pre-op visit, it is requested that you wear a mask when out in public, stay away from anyone that may not be feeling well, and notify your surgeon if you develop symptoms. If you have been in contact with anyone that has tested positive in the last 10 days, please notify your surgeon.    Please read over the following fact sheets that you were given.

## 2021-10-19 ENCOUNTER — Other Ambulatory Visit: Payer: Self-pay

## 2021-10-19 ENCOUNTER — Encounter (HOSPITAL_COMMUNITY)
Admission: RE | Admit: 2021-10-19 | Discharge: 2021-10-19 | Disposition: A | Discharge status: Home / Self Care | Payer: Medicare Other | Source: Ambulatory Visit | Attending: Orthopedic Surgery | Admitting: Orthopedic Surgery

## 2021-10-19 VITALS — BP 155/87 | HR 102 | Temp 98.3°F | Resp 18 | Ht <= 58 in | Wt 127.4 lb

## 2021-10-19 DIAGNOSIS — D869 Sarcoidosis, unspecified: Secondary | ICD-10-CM | POA: Diagnosis not present

## 2021-10-19 DIAGNOSIS — Z01818 Encounter for other preprocedural examination: Secondary | ICD-10-CM | POA: Diagnosis not present

## 2021-10-19 DIAGNOSIS — J984 Other disorders of lung: Secondary | ICD-10-CM | POA: Diagnosis not present

## 2021-10-19 DIAGNOSIS — G20 Parkinson's disease: Secondary | ICD-10-CM | POA: Diagnosis not present

## 2021-10-19 DIAGNOSIS — N183 Chronic kidney disease, stage 3 unspecified: Secondary | ICD-10-CM | POA: Diagnosis not present

## 2021-10-19 LAB — CBC
HCT: 42 % (ref 36.0–46.0)
Hemoglobin: 13.8 g/dL (ref 12.0–15.0)
MCH: 31.9 pg (ref 26.0–34.0)
MCHC: 32.9 g/dL (ref 30.0–36.0)
MCV: 97.2 fL (ref 80.0–100.0)
Platelets: 238 10*3/uL (ref 150–400)
RBC: 4.32 MIL/uL (ref 3.87–5.11)
RDW: 13.6 % (ref 11.5–15.5)
WBC: 5.4 10*3/uL (ref 4.0–10.5)
nRBC: 0 % (ref 0.0–0.2)

## 2021-10-19 LAB — SURGICAL PCR SCREEN
MRSA, PCR: NEGATIVE
Staphylococcus aureus: NEGATIVE

## 2021-10-19 LAB — BASIC METABOLIC PANEL
Anion gap: 8 (ref 5–15)
BUN: 23 mg/dL (ref 8–23)
CO2: 25 mmol/L (ref 22–32)
Calcium: 9.7 mg/dL (ref 8.9–10.3)
Chloride: 105 mmol/L (ref 98–111)
Creatinine, Ser: 1.3 mg/dL — ABNORMAL HIGH (ref 0.44–1.00)
GFR, Estimated: 43 mL/min — ABNORMAL LOW (ref >60)
Glucose, Bld: 141 mg/dL — ABNORMAL HIGH (ref 70–99)
Potassium: 4.6 mmol/L (ref 3.5–5.1)
Sodium: 138 mmol/L (ref 135–145)

## 2021-10-19 LAB — TYPE AND SCREEN
ABO/RH(D): O POS
Antibody Screen: NEGATIVE

## 2021-10-19 NOTE — Progress Notes (Signed)
PCP - Leeroy Cha Cardiologist - Dorris Carnes  PPM/ICD -denies  Device Orders -  Rep Notified -   Chest x-ray - 03/02/20 EKG - 10/19/21 Stress Test - 10/01/13 ECHO - 04/22/19 Cardiac Cath - 11/09/13  Sleep Study - 05/14/2016 CPAP - no  Fasting Blood Sugar - na Checks Blood Sugar _____ times a day  Blood Thinner Instructions:na Aspirin Instructions:pt states she will ask Dr. Rolena Infante for instructions at her upcoming appointment  ERAS Protcol -no PRE-SURGERY Ensure or G2-   COVID TEST- na   Anesthesia review: yes- pt has history of sarcoidosis and Parkinson's  Patient denies shortness of breath, fever, cough and chest pain at PAT appointment   All instructions explained to the patient, with a verbal understanding of the material. Patient agrees to go over the instructions while at home for a better understanding. Patient also instructed to wear a mask when out in public prior to surgery. The opportunity to ask questions was provided.

## 2021-10-22 NOTE — Progress Notes (Signed)
Anesthesia Chart Review:  Follows with pulmonology for history of sarcoidosis and restrictive lung disease.  She is maintained on Incruse Ellipta, Humira, and methotrexate.  Last seen by Dr. Loanne Drilling 07/23/2021.  Per note, "71 year old female with sarcoid, Parkinson's disease who presents for follow-up. Previously on methotrexate and Cellcept however discontinued due to adverse effects including fatigue and difficulty concentrating. Currently on Humira which has controlled her ocular symptoms however recent PET with active worsening pulmonary sarcoid involvement. Currently on methotrexate and Humira as noted below. If eye symptoms stable for two years, will consider titrating off at that time. Counseled on high risk medication management as noted below."  She was noted to be low risk for postoperative pulmonary complications in telephone encounter 08/07/2021 by Dr. Shearon Stalls.  [place] cardiology for history of chest pain (cath 2015 nonobstructive disease, normal stress echo 09/2020, normal cardiac PET/CT at [facility] 03/09/2021).  Last seen 04/24/2021 by Ermalinda Barrios, PA-C.  At that time her amlodipine was discontinued due to BP running low even on a low-dose.  She was recommended to follow-up in 1 year.  Follows with neurology at [facility] for history of Parkinson's disease.  She is maintained on carbidopa levodopa and amantadine.  History of CKD 3, felt related to sarcoidosis.  Followed by nephrology.  Preop labs reviewed, creatinine 1.30 consistent with history of CKD, labs otherwise unremarkable.  EKG 10/20/2021: NSR.  Rate 93.  CHEST - 2 VIEW 03/30/2020: COMPARISON:  Jun 22, 2019   FINDINGS: The heart size is mildly enlarged. Aortic calcifications are noted. There is no pneumothorax. No large pleural effusion. No acute osseous abnormality.   IMPRESSION: Mild cardiomegaly. No acute cardiopulmonary process.    Myocardial PET CT 03/09/2021 (Care Everywhere): FINAL COMMENTS    Metabolism    1- Cardiac PET/CT  Metabolic Study with H-08 FDG 14.8 mCi given IV and Scanned on PET Discovery MI: There are no   segments with abnormal metabolism to indicate presence of inflammation/active inflammatory process in the LV myocardium. No   evidence for abnormal metabolism in the RV free wall. No evidence of active cardiac Sarcoidosis.    Perfusion/Function    1- Gated Cardiac PET/CT Rest Myocardial Perfusion Study with Rb-82 Reveals:   - Normal Perfusion to The Myocardium.   -Normal Left Ventricular Ejection Fraction at Rest as Well as Left Ventricular Volumes.   -Normal Wall Thickening and Wall Motion.   -No Evidence of Increased Tracer Uptake Rb-82 in The Right Ventricle to Suggest RVH and/or Elevated Pulmonary    Pressures.    2- Cardiac CT Viewer: Evidence of Significant Coronary Artery Calcifications.    3- Please see separated dedicated PET/CT SBMT for detail evaluation of extra cardiac findings.   Stress echo 10/25/2020:  1. Stress echocardiogram with no chest pain, normal BP response, no ST  changes and no stress-induced wall motion abnormalities; normal study.   2. This is a negative stress echocardiogram for ischemia.   3. This is a low risk study.   TTE 04/22/2019:  1. Left ventricular ejection fraction, by estimation, is 60 to 65%. The  left ventricle has normal function. The left ventricle has no regional  wall motion abnormalities. The average left ventricular global  longitudinal strain is normal at -20.6 %.   2. Right ventricular systolic function is normal. The right ventricular  size is normal.   3. The mitral valve is normal in structure. Trivial mitral valve  regurgitation. No evidence of mitral stenosis.   4. The aortic valve is  tricuspid. Aortic valve regurgitation is not  visualized. Mild aortic valve sclerosis is present, with no evidence of  aortic valve stenosis.   5. The inferior vena cava is normal in size with greater than 50%  respiratory variability, suggesting right  atrial pressure of 3 mmHg.   Comparison(s): 04/09/13 EF 60-65%.     Destiny Roth [facility] Short Stay Center/Anesthesiology Phone [phone] 10/22/2021 4:05 PM

## 2021-10-22 NOTE — Anesthesia Preprocedure Evaluation (Addendum)
Anesthesia Evaluation  Patient identified by MRN, date of birth, ID band Patient awake    Reviewed: Allergy & Precautions, NPO status , Patient's Chart, lab work & pertinent test results  Airway Mallampati: II  TM Distance: >3 FB Neck ROM: Full    Dental  (+) Teeth Intact, Dental Advisory Given   Pulmonary sleep apnea ,    breath sounds clear to auscultation       Cardiovascular hypertension, Pt. on medications + CAD   Rhythm:Regular Rate:Normal     Neuro/Psych PSYCHIATRIC DISORDERS Anxiety Depression    GI/Hepatic Neg liver ROS, GERD  ,  Endo/Other  negative endocrine ROS  Renal/GU negative Renal ROS     Musculoskeletal negative musculoskeletal ROS (+)   Abdominal   Peds  Hematology negative hematology ROS (+)   Anesthesia Other Findings Parkinson's Sarcoidosis.  Reproductive/Obstetrics                          Anesthesia Physical Anesthesia Plan  ASA: 2  Anesthesia Plan: General   Post-op Pain Management:    Induction: Intravenous  PONV Risk Score and Plan: 4 or greater and Ondansetron, Dexamethasone, Midazolam and Treatment may vary due to age or medical condition  Airway Management Planned: Oral ETT  Additional Equipment: Arterial line  Intra-op Plan:   Post-operative Plan: Extubation in OR  Informed Consent: I have reviewed the patients History and Physical, chart, labs and discussed the procedure including the risks, benefits and alternatives for the proposed anesthesia with the patient or authorized representative who has indicated his/her understanding and acceptance.     Dental advisory given  Plan Discussed with: CRNA  Anesthesia Plan Comments: (PAT note by Karoline Caldwell, PA-C:  Follows with pulmonology for history of sarcoidosis and restrictive lung disease.  She is maintained on Incruse Ellipta, Humira, and methotrexate.  Last seen by Dr. Loanne Drilling 07/23/2021.  Per  note, "71 year old female with sarcoid, Parkinson's disease who presents for follow-up. Previously on methotrexate and Cellcept however discontinued due to adverse effects including fatigue and difficulty concentrating. Currently on Humira which has controlled her ocular symptoms however recent PET with active worsening pulmonary sarcoid involvement.Currently on methotrexate and Humira as noted below. If eye symptoms stable for two years, will consider titrating off at that time. Counseled on high risk medication management as noted below."  She was noted to be low risk for postoperative pulmonary complications in telephone encounter 08/07/2021 by Dr. Shearon Stalls.  [place] cardiology for history of chest pain (cath 2015 nonobstructive disease, normal stress echo 09/2020, normal cardiac PET/CT at [facility] 03/09/2021).  Last seen 04/24/2021 by Ermalinda Barrios, PA-C.  At that time her amlodipine was discontinued due to BP running low even on a low-dose.  She was recommended to follow-up in 1 year.  Follows with neurology at [facility] for history of Parkinson's disease.  She is maintained on carbidopa levodopa and amantadine.  History of CKD 3, felt related to sarcoidosis.  Followed by nephrology.  Preop labs reviewed, creatinine 1.30 consistent with history of CKD, labs otherwise unremarkable.  EKG 10/20/2021: NSR.  Rate 93.  CHEST - 2 VIEW 03/30/2020: COMPARISON: Jun 22, 2019  FINDINGS: The heart size is mildly enlarged. Aortic calcifications are noted. There is no pneumothorax. No large pleural effusion. No acute osseous abnormality.  IMPRESSION: Mild cardiomegaly. No acute cardiopulmonary process.   Myocardial PET CT 03/09/2021 (Care Everywhere): FINAL COMMENTS   Metabolism   1- Cardiac PET/CT Metabolic Study with X-83  FDG 14.8 mCi given IV and Scanned on PET Discovery DX:IPJAS are no  segments with abnormal metabolism to indicate presence of inflammation/active inflammatory process in the LV  myocardium. No  evidence for abnormal metabolism in the RV free wall. No evidence of active cardiac Sarcoidosis.   Perfusion/Function   1- Gated Cardiac PET/CT Rest Myocardial Perfusion Study with Rb-82 Reveals:  - Normal Perfusion to The Myocardium.  -Normal Left Ventricular Ejection Fraction at Rest as Well as Left Ventricular Volumes.  -Normal Wall Thickening and Wall Motion.  -No Evidence of Increased Tracer Uptake Rb-82 in The Right Ventricle to Suggest RVH and/or Elevated Pulmonary  Pressures.   2- Cardiac CT Viewer: Evidence of Significant Coronary Artery Calcifications.   3- Please see separated dedicated PET/CT SBMT for detail evaluation of extra cardiac findings.   Stress echo 10/25/2020: 1. Stress echocardiogram with no chest pain, normal BP response, no ST  changes and no stress-induced wall motion abnormalities; normal study.  2. This is a negative stress echocardiogram for ischemia.  3. This is a low risk study.   TTE 04/22/2019: 1. Left ventricular ejection fraction, by estimation, is 60 to 65%. The  left ventricle has normal function. The left ventricle has no regional  wall motion abnormalities. The average left ventricular global  longitudinal strain is normal at -20.6 %.  2. Right ventricular systolic function is normal. The right ventricular  size is normal.  3. The mitral valve is normal in structure. Trivial mitral valve  regurgitation. No evidence of mitral stenosis.  4. The aortic valve is tricuspid. Aortic valve regurgitation is not  visualized. Mild aortic valve sclerosis is present, with no evidence of  aortic valve stenosis.  5. The inferior vena cava is normal in size with greater than 50%  respiratory variability, suggesting right atrial pressure of 3 mmHg.   Comparison(s): 04/09/13 EF 60-65%.  )      Anesthesia Quick Evaluation

## 2021-10-23 ENCOUNTER — Encounter: Payer: Self-pay | Admitting: Vascular Surgery

## 2021-10-23 ENCOUNTER — Ambulatory Visit (INDEPENDENT_AMBULATORY_CARE_PROVIDER_SITE_OTHER): Payer: Medicare Other | Admitting: Vascular Surgery

## 2021-10-23 ENCOUNTER — Ambulatory Visit: Payer: Medicare Other | Admitting: Pulmonary Disease

## 2021-10-23 VITALS — BP 116/75 | HR 102 | Temp 98.0°F | Resp 14 | Ht <= 58 in | Wt 126.0 lb

## 2021-10-23 DIAGNOSIS — M4310 Spondylolisthesis, site unspecified: Secondary | ICD-10-CM

## 2021-10-23 NOTE — Progress Notes (Signed)
Patient name: Destiny Roth MRN: [id number] DOB: [birth year] Sex: female  REASON FOR CONSULT: Evaluate for abdominal exposure for OLIF L4-L5  HPI: Destiny Roth is a 71 y.o. female, with history of hypertension, Parkinson's disease, sarcoidosis and chronic lower back pain that presents for evaluation of abdominal exposure for L4-L5 OLIF.  She describes years of lower back pain with some radicular symptoms.  This is worse over the last several months.  She has failed conservative management.  She has been evaluated Dr. Rolena Infante and vascular surgery was asked to assist with spine exposure for L4-L5 OLIF.  Previous abdominal surgery includes tubal ligation.  MRI imaging shows degenerative spondylolisthesis at L4-L5. Past Medical History:  Diagnosis Date   Abdominal pain, epigastric 09/18/2009   Anosmia 11/06/2010   ANXIETY 09/23/2006   BUNION, RIGHT FOOT 09/18/2009   CARPAL TUNNEL SYNDROME, BILATERAL 09/23/2006   DEPRESSION 09/23/2006   DIVERTICULOSIS, COLON 09/23/2006   GERD 05/24/2008   GLUCOSE INTOLERANCE 05/24/2008   Hemorrhoids    Hypercalcemia    HYPERLIPIDEMIA 09/23/2006   HYPERTENSION 09/23/2006   IBS 09/23/2006   Impaired glucose tolerance 11/04/2010   OSTEOPOROSIS 05/24/2008   06/01/19-pt states osteopenia   Parkinson's disease ([place])    Restrictive lung disease 12/20/2013   Sarcoidosis 02/26/2019    Past Surgical History:  Procedure Laterality Date   CARDIAC CATHETERIZATION     CATARACT EXTRACTION     COLONOSCOPY  2008   COLONOSCOPY W/ BIOPSIES  2008   Dr. Collene Mares -negative random bxs   ESOPHAGOGASTRODUODENOSCOPY  2008   Dr. Harriette Bouillon duodenal ulcer   FOOT SURGERY Bilateral 2015   LEFT AND RIGHT HEART CATHETERIZATION WITH CORONARY ANGIOGRAM N/A 11/09/2013   Procedure: LEFT AND RIGHT HEART CATHETERIZATION WITH CORONARY ANGIOGRAM;  Surgeon: Peter M Martinique, MD;  Location: [facility] CATH LAB;  Service: Cardiovascular;  Laterality: N/A;   nasal septum repair     s/p bilat CTS      s/p fibroid ablation     s/p ganglion cyst right wrist     s/p right thumb trigger finger surgury     SHOULDER SURGERY Right 2015   TUBAL LIGATION     UPPER GASTROINTESTINAL ENDOSCOPY      Family History  Problem Relation Age of Onset   Peripheral vascular disease Mother        with bilat amputations   Heart attack Mother    Hypertension Father    Heart disease Father 48   Hypertension Brother    Heart attack Brother    Hypertension Brother    Hypertension Brother    Colon cancer Neg Hx    Colon polyps Neg Hx    Esophageal cancer Neg Hx    Rectal cancer Neg Hx    Stomach cancer Neg Hx     SOCIAL HISTORY: Social History   Socioeconomic History   Marital status: Married    Spouse name: Not on file   Number of children: 2   Years of education: Not on file   Highest education level: Not on file  Occupational History   Not on file  Tobacco Use   Smoking status: Never    Passive exposure: Yes   Smokeless tobacco: Never  Vaping Use   Vaping Use: Never used  Substance and Sexual Activity   Alcohol use: Not Currently    Comment: Occ   Drug use: No   Sexual activity: Yes    Partners: Male    Birth control/protection: Post-menopausal  Other Topics  Concern   Not on file  Social History Narrative   Not on file   Social Determinants of Health   Financial Resource Strain: Not on file  Food Insecurity: Not on file  Transportation Needs: Not on file  Physical Activity: Not on file  Stress: Not on file  Social Connections: Not on file  Intimate Partner Violence: Not on file    Allergies  Allergen Reactions   Myrbetriq [Mirabegron] Other (See Comments)    SEVERE muscle and joint pain   Atorvastatin Other (See Comments)    Muscle pain Muscle pain myalgia   Zocor [Simvastatin] Other (See Comments)    Muscle pain    Current Outpatient Medications  Medication Sig Dispense Refill   acetaminophen (TYLENOL) 500 MG tablet Take 500 mg by mouth every 6 (six) hours  as needed for mild pain.     Adalimumab (HUMIRA PEN) 40 MG/0.8ML PNKT Inject 0.4 mLs into the skin every 14 (fourteen) days.     Amantadine HCl 100 MG tablet Take 100 mg by mouth in the morning, at noon, and at bedtime.     amLODipine (NORVASC) 2.5 MG tablet Take 1 tablet by mouth daily.     aspirin EC 81 MG tablet Take 81 mg by mouth daily.     Biotin 1 MG CAPS Take 1 mg by mouth daily at 2 PM. (Patient not taking: Reported on 10/19/2021) 30 capsule 5   carbidopa-levodopa (SINEMET IR) 25-100 MG tablet Take 0.5-1 tablets by mouth See admin instructions. Take 1 tablet by mouth in the morning and then take half tablet by mouth at noon, and 4 pm and at bedtime per patient     Carbidopa-Levodopa ER (SINEMET CR) 25-100 MG tablet controlled release Take 1 tablet by mouth 2 (two) times daily.     cephALEXin (KEFLEX) 500 MG capsule Take 1 capsule (500 mg total) by mouth 2 (two) times daily. (Patient not taking: Reported on 10/19/2021) 10 capsule 0   Cholecalciferol (VITAMIN D3) 50 MCG (2000 UT) capsule Take 2,000 Units by mouth daily.     CRANBERRY PO Take 1 tablet by mouth in the morning and at bedtime.     diphenhydramine-acetaminophen (TYLENOL PM) 25-500 MG TABS tablet Take 1 tablet by mouth at bedtime as needed (For sleep).     folic acid (FOLVITE) 1 MG tablet Take 1 tablet by mouth daily.     gabapentin (NEURONTIN) 100 MG capsule Take 2 capsules by mouth See admin instructions. Take two in AM 1 at noon 1 at 4pm and two at bedtime     Melatonin 5 MG CAPS Take 1 capsule by mouth at bedtime.     methotrexate (RHEUMATREX) 10 MG tablet Take by mouth. (Patient not taking: Reported on 10/19/2021)     methotrexate (RHEUMATREX) 2.5 MG tablet TAKE 4 TABLETS (10 MG TOTAL) BY MOUTH ONCE A WEEK. CAUTION:CHEMOTHERAPY. PROTECT FROM LIGHT. 48 tablet 0   rOPINIRole (REQUIP) 1 MG tablet Take 1 tablet (1 mg total) by mouth 3 (three) times daily. 90 tablet 0   rosuvastatin (CRESTOR) 10 MG tablet TAKE 1 TABLET BY MOUTH EVERY  DAY 90 tablet 1   rosuvastatin (CRESTOR) 5 MG tablet Take by mouth. (Patient not taking: Reported on 10/19/2021)     senna (SENOKOT) 8.6 MG TABS tablet Take 1 tablet (8.6 mg total) by mouth 2 (two) times daily. (Patient not taking: Reported on 10/19/2021) 30 tablet 0   sertraline (ZOLOFT) 100 MG tablet Take 1 tablet by mouth daily.  umeclidinium bromide (INCRUSE ELLIPTA) 62.5 MCG/ACT AEPB 1 puff daily.     No current facility-administered medications for this visit.    REVIEW OF SYSTEMS:  '[X]'$  denotes positive finding, '[ ]'$  denotes negative finding Cardiac  Comments:  Chest pain or chest pressure:    Shortness of breath upon exertion:    Short of breath when lying flat:    Irregular heart rhythm:        Vascular    Pain in calf, thigh, or hip brought on by ambulation:    Pain in feet at night that wakes you up from your sleep:     Blood clot in your veins:    Leg swelling:         Pulmonary    Oxygen at home:    Productive cough:     Wheezing:         Neurologic    Sudden weakness in arms or legs:     Sudden numbness in arms or legs:     Sudden onset of difficulty speaking or slurred speech:    Temporary loss of vision in one eye:     Problems with dizziness:         Gastrointestinal    Blood in stool:     Vomited blood:         Genitourinary    Burning when urinating:     Blood in urine:        Psychiatric    Major depression:         Hematologic    Bleeding problems:    Problems with blood clotting too easily:        Skin    Rashes or ulcers:        Constitutional    Fever or chills:      PHYSICAL EXAM: There were no vitals filed for this visit.  GENERAL: The patient is a well-nourished female, in no acute distress. The vital signs are documented above. CARDIAC: There is a regular rate and rhythm.  VASCULAR:  Palpable femoral pulses bilaterally Palpable AT pulses bilaterally PULMONARY: No respiratory distress. ABDOMEN: Soft and  non-tender. MUSCULOSKELETAL: There are no major deformities or cyanosis. NEUROLOGIC: No focal weakness or paresthesias are detected. SKIN: There are no ulcers or rashes noted. PSYCHIATRIC: The patient has a normal affect.  DATA:   MRI Lumbar 07/21/21    Assessment/Plan:  71 y.o. female, with history of hypertension, Parkinson's disease, sarcoidosis and chronic lower back pain that presents for evaluation of abdominal exposure for L4-L5 OLIF.  I have reviewed her MRI and I think she would be a good candidate for OLIF.  I discussed her being in the right lateral position and then making an incision over the left oblique muscles 2 fingerbreadths off the left iliac crest to then perform a muscle-sparing technique between the oblique muscles and then mobilizing the peritoneum to get into the retroperitoneum and mobilized the left psoas as well as possible the iliac vessels to get to the L4-L5 disc space.  Discussed risk of possible injury to both structures.  Look forward to assisting Dr. Rolena Infante.  All questions answered.  She is scheduled for Monday.   Marty Heck, MD [facility] Office: [phone]

## 2021-10-25 ENCOUNTER — Telehealth: Payer: Self-pay | Admitting: Pharmacist

## 2021-10-25 DIAGNOSIS — M4316 Spondylolisthesis, lumbar region: Secondary | ICD-10-CM | POA: Diagnosis not present

## 2021-10-25 NOTE — Telephone Encounter (Signed)
Patient states that hse is having lumbar fusion surgery on 10/29/21 and orthopedist requested that she reach out to Dr. Loanne Drilling on holding MTX and Humira.  Patient is due for both doses on Tuesday, 10/30/21. Advised her to hold MTX and Humira on 10/30/21 to decrease risk of post-op infection. ACR recommends for knee and hip replacement surgeries to wait at least 2 weeks prior to resumption of immmunosuppressive medication. Fusion surgeries may require holding longer and generally ortho provides clearance pending their assessment of post-operative healing.  Nevertheless, will route to Dr. Loanne Drilling for recommendations if any.  Patient has been advised to hold both MTX and Humira until someone from pulmonology or ortho tells her otherwise  Knox Saliva, PharmD, MPH, BCPS, CPP Clinical Pharmacist (Rheumatology and Pulmonology)

## 2021-10-26 ENCOUNTER — Encounter: Payer: Self-pay | Admitting: Pulmonary Disease

## 2021-10-26 NOTE — Telephone Encounter (Unsigned)
[place] [facility] Telephone Encounter  Discussed pre and peri-op management of immunosuppressants in setting of lumbar fusion.  Increased risk of surgical site infection s have been seen in patients on TNF inhibitors in RA population. Lissa Hoard M, Mazouyes A, Gilson M, Gaudin P, Baillet A. Risk of postoperative infections and the discontinuation of TNF inhibitors in patients with rheumatoid arthritis: A meta-analysis. Joint Bone Spine. 2016 Dec;83(6):701-705. doi: 10.1016/j.jbspin.2015.10.019. Epub 2016 Apr 23. PMID: 01751025. )  From Pulmonary standpoint, ok to hold immunosuppressants for treatment of flare as infection risk should be prioritized.  Advised patient to hold Humira and methotrexate (skip 10/30/21 dosing) for 2 weeks. Ok to resume on 11/12/21.  Advised patient to hold on restarting Humira and methotrexate if her surgeon is concerned about wound healing/infection at any point. She can contact our office to discuss plan at any point.  I will message her surgeon if longer time off  (>2 weeks) of immunosuppressants is needed for lumbar infusion. Currently data for two week hold is based on knee and hip surgeries.

## 2021-10-29 ENCOUNTER — Other Ambulatory Visit: Payer: Self-pay

## 2021-10-29 ENCOUNTER — Inpatient Hospital Stay (HOSPITAL_COMMUNITY): Payer: Medicare Other | Admitting: Anesthesiology

## 2021-10-29 ENCOUNTER — Encounter (HOSPITAL_COMMUNITY): Payer: Self-pay | Admitting: Orthopedic Surgery

## 2021-10-29 ENCOUNTER — Encounter (HOSPITAL_COMMUNITY)
Admission: RE | Disposition: A | Discharge status: Home Health | Payer: Self-pay | Source: Home / Self Care | Attending: Orthopedic Surgery

## 2021-10-29 ENCOUNTER — Inpatient Hospital Stay (HOSPITAL_COMMUNITY)
Admission: RE | Admit: 2021-10-29 | Discharge: 2021-10-31 | DRG: 460 | Disposition: A | Discharge status: Home Health | Payer: Medicare Other | Attending: Orthopedic Surgery | Admitting: Orthopedic Surgery

## 2021-10-29 ENCOUNTER — Inpatient Hospital Stay (HOSPITAL_COMMUNITY): Payer: Medicare Other

## 2021-10-29 ENCOUNTER — Inpatient Hospital Stay (HOSPITAL_COMMUNITY): Payer: Medicare Other | Admitting: Physician Assistant

## 2021-10-29 DIAGNOSIS — G20A1 Parkinson's disease without dyskinesia, without mention of fluctuations: Secondary | ICD-10-CM | POA: Diagnosis present

## 2021-10-29 DIAGNOSIS — M4726 Other spondylosis with radiculopathy, lumbar region: Secondary | ICD-10-CM | POA: Diagnosis not present

## 2021-10-29 DIAGNOSIS — G4751 Confusional arousals: Secondary | ICD-10-CM | POA: Diagnosis not present

## 2021-10-29 DIAGNOSIS — Z888 Allergy status to other drugs, medicaments and biological substances status: Secondary | ICD-10-CM | POA: Diagnosis not present

## 2021-10-29 DIAGNOSIS — M4316 Spondylolisthesis, lumbar region: Secondary | ICD-10-CM

## 2021-10-29 DIAGNOSIS — K589 Irritable bowel syndrome without diarrhea: Secondary | ICD-10-CM | POA: Diagnosis present

## 2021-10-29 DIAGNOSIS — M5116 Intervertebral disc disorders with radiculopathy, lumbar region: Secondary | ICD-10-CM

## 2021-10-29 DIAGNOSIS — M5416 Radiculopathy, lumbar region: Secondary | ICD-10-CM | POA: Diagnosis present

## 2021-10-29 DIAGNOSIS — R4182 Altered mental status, unspecified: Secondary | ICD-10-CM | POA: Diagnosis not present

## 2021-10-29 DIAGNOSIS — D869 Sarcoidosis, unspecified: Secondary | ICD-10-CM | POA: Diagnosis present

## 2021-10-29 DIAGNOSIS — Z7982 Long term (current) use of aspirin: Secondary | ICD-10-CM

## 2021-10-29 DIAGNOSIS — Z79899 Other long term (current) drug therapy: Secondary | ICD-10-CM | POA: Diagnosis not present

## 2021-10-29 DIAGNOSIS — Z79631 Long term (current) use of antimetabolite agent: Secondary | ICD-10-CM

## 2021-10-29 DIAGNOSIS — K219 Gastro-esophageal reflux disease without esophagitis: Secondary | ICD-10-CM | POA: Diagnosis present

## 2021-10-29 DIAGNOSIS — Z8711 Personal history of peptic ulcer disease: Secondary | ICD-10-CM

## 2021-10-29 DIAGNOSIS — I10 Essential (primary) hypertension: Secondary | ICD-10-CM | POA: Diagnosis present

## 2021-10-29 DIAGNOSIS — M21611 Bunion of right foot: Secondary | ICD-10-CM | POA: Diagnosis present

## 2021-10-29 DIAGNOSIS — E785 Hyperlipidemia, unspecified: Secondary | ICD-10-CM | POA: Diagnosis present

## 2021-10-29 DIAGNOSIS — G8918 Other acute postprocedural pain: Secondary | ICD-10-CM | POA: Diagnosis not present

## 2021-10-29 DIAGNOSIS — F419 Anxiety disorder, unspecified: Secondary | ICD-10-CM | POA: Diagnosis present

## 2021-10-29 DIAGNOSIS — F32A Depression, unspecified: Secondary | ICD-10-CM | POA: Diagnosis present

## 2021-10-29 DIAGNOSIS — Z981 Arthrodesis status: Principal | ICD-10-CM

## 2021-10-29 DIAGNOSIS — M545 Low back pain, unspecified: Secondary | ICD-10-CM | POA: Diagnosis not present

## 2021-10-29 DIAGNOSIS — E7439 Other disorders of intestinal carbohydrate absorption: Secondary | ICD-10-CM | POA: Diagnosis present

## 2021-10-29 DIAGNOSIS — Z8249 Family history of ischemic heart disease and other diseases of the circulatory system: Secondary | ICD-10-CM

## 2021-10-29 DIAGNOSIS — M810 Age-related osteoporosis without current pathological fracture: Secondary | ICD-10-CM | POA: Diagnosis present

## 2021-10-29 DIAGNOSIS — Z79620 Long term (current) use of immunosuppressive biologic: Secondary | ICD-10-CM

## 2021-10-29 DIAGNOSIS — Z5321 Procedure and treatment not carried out due to patient leaving prior to being seen by health care provider: Secondary | ICD-10-CM | POA: Diagnosis not present

## 2021-10-29 DIAGNOSIS — G8929 Other chronic pain: Secondary | ICD-10-CM | POA: Diagnosis present

## 2021-10-29 DIAGNOSIS — J984 Other disorders of lung: Secondary | ICD-10-CM | POA: Diagnosis present

## 2021-10-29 DIAGNOSIS — M4326 Fusion of spine, lumbar region: Secondary | ICD-10-CM | POA: Diagnosis not present

## 2021-10-29 DIAGNOSIS — G20C Parkinsonism, unspecified: Secondary | ICD-10-CM | POA: Diagnosis not present

## 2021-10-29 DIAGNOSIS — R531 Weakness: Secondary | ICD-10-CM | POA: Diagnosis not present

## 2021-10-29 DIAGNOSIS — R059 Cough, unspecified: Secondary | ICD-10-CM | POA: Diagnosis not present

## 2021-10-29 HISTORY — PX: ABDOMINAL EXPOSURE: SHX5708

## 2021-10-29 HISTORY — PX: OBLIQUE LUMBAR INTERBODY FUSION 1 LEVEL WITH PERCUTANEOUS SCREWS: SHX6890

## 2021-10-29 LAB — ABO/RH: ABO/RH(D): O POS

## 2021-10-29 SURGERY — OBLIQUE LUMBAR INTERBODY FUSION 1 LEVEL WITH PERCUTANEOUS SCREWS
Anesthesia: General | Site: Spine Lumbar

## 2021-10-29 MED ORDER — ONDANSETRON HCL 4 MG PO TABS
4.0000 mg | ORAL_TABLET | Freq: Four times a day (QID) | ORAL | Status: DC | PRN
  Administered 2021-10-31: 4 mg via ORAL
  Filled 2021-10-29: qty 1

## 2021-10-29 MED ORDER — DEXAMETHASONE SODIUM PHOSPHATE 10 MG/ML IJ SOLN
INTRAMUSCULAR | Status: DC | PRN
  Administered 2021-10-29: 10 mg via INTRAVENOUS

## 2021-10-29 MED ORDER — LACTATED RINGERS IV SOLN: INTRAVENOUS | Status: DC

## 2021-10-29 MED ORDER — ACETAMINOPHEN 10 MG/ML IV SOLN
INTRAVENOUS | Status: DC | PRN
  Administered 2021-10-29: 1000 mg via INTRAVENOUS

## 2021-10-29 MED ORDER — CEFAZOLIN SODIUM-DEXTROSE 2-4 GM/100ML-% IV SOLN
2.0000 g | INTRAVENOUS | Status: AC
  Administered 2021-10-29: 2 g via INTRAVENOUS
  Filled 2021-10-29: qty 100

## 2021-10-29 MED ORDER — METHOCARBAMOL 500 MG PO TABS
500.0000 mg | ORAL_TABLET | Freq: Four times a day (QID) | ORAL | Status: DC | PRN
  Administered 2021-10-30 – 2021-10-31 (×4): 500 mg via ORAL
  Filled 2021-10-29 (×5): qty 1

## 2021-10-29 MED ORDER — DEXAMETHASONE SODIUM PHOSPHATE 10 MG/ML IJ SOLN
INTRAMUSCULAR | Status: AC
  Filled 2021-10-29: qty 1

## 2021-10-29 MED ORDER — BUPIVACAINE-EPINEPHRINE 0.25% -1:200000 IJ SOLN
INTRAMUSCULAR | Status: DC | PRN
  Administered 2021-10-29: 20 mL

## 2021-10-29 MED ORDER — CHLORHEXIDINE GLUCONATE 0.12 % MT SOLN
15.0000 mL | Freq: Once | OROMUCOSAL | Status: AC
  Administered 2021-10-29: 15 mL via OROMUCOSAL
  Filled 2021-10-29: qty 15

## 2021-10-29 MED ORDER — PHENYLEPHRINE HCL-NACL 20-0.9 MG/250ML-% IV SOLN
INTRAVENOUS | Status: DC | PRN
  Administered 2021-10-29: 40 ug/min via INTRAVENOUS

## 2021-10-29 MED ORDER — FENTANYL CITRATE (PF) 250 MCG/5ML IJ SOLN
INTRAMUSCULAR | Status: DC | PRN
  Administered 2021-10-29: 50 ug via INTRAVENOUS
  Administered 2021-10-29: 100 ug via INTRAVENOUS
  Administered 2021-10-29: 50 ug via INTRAVENOUS

## 2021-10-29 MED ORDER — LIDOCAINE 2% (20 MG/ML) 5 ML SYRINGE
INTRAMUSCULAR | Status: DC | PRN
  Administered 2021-10-29: 60 mg via INTRAVENOUS

## 2021-10-29 MED ORDER — CARBIDOPA-LEVODOPA 25-100 MG PO TABS
1.0000 | ORAL_TABLET | Freq: Every day | ORAL | Status: DC
  Filled 2021-10-29: qty 1

## 2021-10-29 MED ORDER — ACETAMINOPHEN 650 MG RE SUPP: 650.0000 mg | RECTAL | Status: DC | PRN

## 2021-10-29 MED ORDER — ROCURONIUM BROMIDE 10 MG/ML (PF) SYRINGE
PREFILLED_SYRINGE | INTRAVENOUS | Status: AC
  Filled 2021-10-29: qty 10

## 2021-10-29 MED ORDER — METHOCARBAMOL 500 MG PO TABS: 500.0000 mg | ORAL_TABLET | Freq: Three times a day (TID) | ORAL | 0 refills | Status: AC | PRN

## 2021-10-29 MED ORDER — HYDROMORPHONE HCL 1 MG/ML IJ SOLN
INTRAMUSCULAR | Status: AC
  Filled 2021-10-29: qty 1

## 2021-10-29 MED ORDER — EPHEDRINE SULFATE-NACL 50-0.9 MG/10ML-% IV SOSY: PREFILLED_SYRINGE | INTRAVENOUS | Status: DC | PRN

## 2021-10-29 MED ORDER — METHOCARBAMOL 1000 MG/10ML IJ SOLN
500.0000 mg | Freq: Four times a day (QID) | INTRAVENOUS | Status: DC | PRN
  Administered 2021-10-29: 500 mg via INTRAVENOUS
  Filled 2021-10-29: qty 500

## 2021-10-29 MED ORDER — PROPOFOL 10 MG/ML IV BOLUS
INTRAVENOUS | Status: DC | PRN
  Administered 2021-10-29: 20 mg via INTRAVENOUS
  Administered 2021-10-29: 150 mg via INTRAVENOUS

## 2021-10-29 MED ORDER — PHENOL 1.4 % MT LIQD: 1.0000 | OROMUCOSAL | Status: DC | PRN

## 2021-10-29 MED ORDER — ACETAMINOPHEN 160 MG/5ML PO SOLN: 325.0000 mg | Freq: Once | ORAL | Status: DC | PRN

## 2021-10-29 MED ORDER — SODIUM CHLORIDE 0.9% FLUSH: 3.0000 mL | INTRAVENOUS | Status: DC | PRN

## 2021-10-29 MED ORDER — BUPIVACAINE-EPINEPHRINE (PF) 0.25% -1:200000 IJ SOLN
INTRAMUSCULAR | Status: AC
  Filled 2021-10-29: qty 30

## 2021-10-29 MED ORDER — CHLORHEXIDINE GLUCONATE CLOTH 2 % EX PADS: 6.0000 | MEDICATED_PAD | Freq: Once | CUTANEOUS | Status: DC

## 2021-10-29 MED ORDER — ACETAMINOPHEN 10 MG/ML IV SOLN
INTRAVENOUS | Status: AC
  Filled 2021-10-29: qty 100

## 2021-10-29 MED ORDER — ACETAMINOPHEN 10 MG/ML IV SOLN: 1000.0000 mg | Freq: Once | INTRAVENOUS | Status: DC | PRN

## 2021-10-29 MED ORDER — OXYCODONE-ACETAMINOPHEN 10-325 MG PO TABS: 1.0000 | ORAL_TABLET | Freq: Four times a day (QID) | ORAL | 0 refills | Status: AC | PRN

## 2021-10-29 MED ORDER — ORAL CARE MOUTH RINSE: 15.0000 mL | Freq: Once | OROMUCOSAL | Status: AC

## 2021-10-29 MED ORDER — LIDOCAINE 2% (20 MG/ML) 5 ML SYRINGE
INTRAMUSCULAR | Status: AC
  Filled 2021-10-29: qty 5

## 2021-10-29 MED ORDER — MENTHOL 3 MG MT LOZG: 1.0000 | LOZENGE | OROMUCOSAL | Status: DC | PRN

## 2021-10-29 MED ORDER — HYDROMORPHONE HCL 1 MG/ML IJ SOLN: 0.5000 mg | INTRAMUSCULAR | Status: AC | PRN

## 2021-10-29 MED ORDER — ONDANSETRON HCL 4 MG PO TABS: 4.0000 mg | ORAL_TABLET | Freq: Three times a day (TID) | ORAL | 0 refills | Status: DC | PRN

## 2021-10-29 MED ORDER — PHENYLEPHRINE 80 MCG/ML (10ML) SYRINGE FOR IV PUSH (FOR BLOOD PRESSURE SUPPORT)
PREFILLED_SYRINGE | INTRAVENOUS | Status: AC
  Filled 2021-10-29: qty 10

## 2021-10-29 MED ORDER — PROPOFOL 500 MG/50ML IV EMUL
INTRAVENOUS | Status: DC | PRN
  Administered 2021-10-29: 75 ug/kg/min via INTRAVENOUS

## 2021-10-29 MED ORDER — SERTRALINE HCL 100 MG PO TABS
100.0000 mg | ORAL_TABLET | Freq: Every day | ORAL | Status: DC
  Administered 2021-10-30 – 2021-10-31 (×2): 100 mg via ORAL
  Filled 2021-10-29 (×2): qty 1

## 2021-10-29 MED ORDER — CARBIDOPA-LEVODOPA 25-100 MG PO TABS
0.5000 | ORAL_TABLET | ORAL | Status: DC
  Administered 2021-10-29 – 2021-10-31 (×3): 0.5 via ORAL
  Filled 2021-10-29: qty 0.5

## 2021-10-29 MED ORDER — 0.9 % SODIUM CHLORIDE (POUR BTL) OPTIME
TOPICAL | Status: DC | PRN
  Administered 2021-10-29 (×2): 1000 mL

## 2021-10-29 MED ORDER — SODIUM CHLORIDE 0.9% FLUSH
3.0000 mL | Freq: Two times a day (BID) | INTRAVENOUS | Status: DC
  Administered 2021-10-29 – 2021-10-30 (×4): 3 mL via INTRAVENOUS

## 2021-10-29 MED ORDER — OXYCODONE HCL 5 MG PO TABS
5.0000 mg | ORAL_TABLET | ORAL | Status: DC | PRN
  Filled 2021-10-29: qty 1

## 2021-10-29 MED ORDER — UMECLIDINIUM BROMIDE 62.5 MCG/ACT IN AEPB
1.0000 | INHALATION_SPRAY | Freq: Every day | RESPIRATORY_TRACT | Status: DC
  Filled 2021-10-29: qty 7

## 2021-10-29 MED ORDER — CARBIDOPA-LEVODOPA ER 25-100 MG PO TBCR
1.0000 | EXTENDED_RELEASE_TABLET | Freq: Two times a day (BID) | ORAL | Status: DC
  Administered 2021-10-30 – 2021-10-31 (×2): 1 via ORAL
  Filled 2021-10-29 (×5): qty 1

## 2021-10-29 MED ORDER — ONDANSETRON HCL 4 MG/2ML IJ SOLN
INTRAMUSCULAR | Status: AC
  Filled 2021-10-29: qty 2

## 2021-10-29 MED ORDER — THROMBIN 20000 UNITS EX SOLR
CUTANEOUS | Status: DC | PRN
  Administered 2021-10-29: 20 mL via TOPICAL

## 2021-10-29 MED ORDER — BUPIVACAINE HCL (PF) 0.5 % IJ SOLN
INTRAMUSCULAR | Status: DC | PRN
  Administered 2021-10-29: 20 mL

## 2021-10-29 MED ORDER — PROPOFOL 10 MG/ML IV BOLUS
INTRAVENOUS | Status: AC
  Filled 2021-10-29: qty 20

## 2021-10-29 MED ORDER — HYDROMORPHONE HCL 1 MG/ML IJ SOLN
0.2500 mg | INTRAMUSCULAR | Status: DC | PRN
  Administered 2021-10-29 (×4): 0.5 mg via INTRAVENOUS

## 2021-10-29 MED ORDER — SUGAMMADEX SODIUM 200 MG/2ML IV SOLN
INTRAVENOUS | Status: DC | PRN
  Administered 2021-10-29: 125 mg via INTRAVENOUS

## 2021-10-29 MED ORDER — LACTATED RINGERS IV SOLN: INTRAVENOUS | Status: DC | PRN

## 2021-10-29 MED ORDER — FLEET ENEMA 7-19 GM/118ML RE ENEM: 1.0000 | ENEMA | Freq: Once | RECTAL | Status: DC | PRN

## 2021-10-29 MED ORDER — POLYETHYLENE GLYCOL 3350 17 G PO PACK
17.0000 g | PACK | Freq: Every day | ORAL | Status: DC | PRN
  Administered 2021-10-30: 17 g via ORAL
  Filled 2021-10-29: qty 1

## 2021-10-29 MED ORDER — THROMBIN 20000 UNITS EX SOLR
CUTANEOUS | Status: AC
  Filled 2021-10-29: qty 20000

## 2021-10-29 MED ORDER — ROCURONIUM BROMIDE 10 MG/ML (PF) SYRINGE
PREFILLED_SYRINGE | INTRAVENOUS | Status: DC | PRN
  Administered 2021-10-29: 10 mg via INTRAVENOUS
  Administered 2021-10-29: 40 mg via INTRAVENOUS

## 2021-10-29 MED ORDER — ONDANSETRON HCL 4 MG/2ML IJ SOLN
INTRAMUSCULAR | Status: DC | PRN
  Administered 2021-10-29: 4 mg via INTRAVENOUS

## 2021-10-29 MED ORDER — AMANTADINE HCL 100 MG PO CAPS
100.0000 mg | ORAL_CAPSULE | Freq: Three times a day (TID) | ORAL | Status: DC
  Administered 2021-10-30: 100 mg via ORAL
  Filled 2021-10-29 (×8): qty 1

## 2021-10-29 MED ORDER — SUGAMMADEX SODIUM 500 MG/5ML IV SOLN
INTRAVENOUS | Status: AC
  Filled 2021-10-29: qty 5

## 2021-10-29 MED ORDER — ROPINIROLE HCL 1 MG PO TABS
1.0000 mg | ORAL_TABLET | Freq: Three times a day (TID) | ORAL | Status: DC
  Administered 2021-10-29 – 2021-10-31 (×4): 1 mg via ORAL
  Filled 2021-10-29: qty 1

## 2021-10-29 MED ORDER — PROMETHAZINE HCL 25 MG/ML IJ SOLN: 6.2500 mg | INTRAMUSCULAR | Status: DC | PRN

## 2021-10-29 MED ORDER — AMISULPRIDE (ANTIEMETIC) 5 MG/2ML IV SOLN: 10.0000 mg | Freq: Once | INTRAVENOUS | Status: DC | PRN

## 2021-10-29 MED ORDER — FENTANYL CITRATE (PF) 250 MCG/5ML IJ SOLN
INTRAMUSCULAR | Status: AC
  Filled 2021-10-29: qty 5

## 2021-10-29 MED ORDER — PHENYLEPHRINE 80 MCG/ML (10ML) SYRINGE FOR IV PUSH (FOR BLOOD PRESSURE SUPPORT)
PREFILLED_SYRINGE | INTRAVENOUS | Status: DC | PRN
  Administered 2021-10-29: 80 ug via INTRAVENOUS
  Administered 2021-10-29 (×2): 160 ug via INTRAVENOUS
  Administered 2021-10-29 (×2): 80 ug via INTRAVENOUS
  Administered 2021-10-29: 160 ug via INTRAVENOUS
  Administered 2021-10-29: 80 ug via INTRAVENOUS

## 2021-10-29 MED ORDER — ACETAMINOPHEN 325 MG PO TABS: 325.0000 mg | ORAL_TABLET | Freq: Once | ORAL | Status: DC | PRN

## 2021-10-29 MED ORDER — PROPOFOL 10 MG/ML IV BOLUS: INTRAVENOUS | Status: DC | PRN

## 2021-10-29 MED ORDER — SURGIFLO WITH THROMBIN (HEMOSTATIC MATRIX KIT) OPTIME
TOPICAL | Status: DC | PRN
  Administered 2021-10-29 (×2): 1 via TOPICAL

## 2021-10-29 MED ORDER — ONDANSETRON HCL 4 MG/2ML IJ SOLN: 4.0000 mg | Freq: Four times a day (QID) | INTRAMUSCULAR | Status: DC | PRN

## 2021-10-29 MED ORDER — SODIUM CHLORIDE 0.9 % IV SOLN: INTRAVENOUS | Status: DC

## 2021-10-29 MED ORDER — CEFAZOLIN SODIUM-DEXTROSE 1-4 GM/50ML-% IV SOLN
1.0000 g | Freq: Three times a day (TID) | INTRAVENOUS | Status: AC
  Administered 2021-10-29 (×2): 1 g via INTRAVENOUS
  Filled 2021-10-29 (×2): qty 50

## 2021-10-29 MED ORDER — OXYCODONE HCL 5 MG PO TABS
10.0000 mg | ORAL_TABLET | ORAL | Status: DC | PRN
  Administered 2021-10-29 – 2021-10-31 (×9): 10 mg via ORAL
  Filled 2021-10-29 (×9): qty 2

## 2021-10-29 MED ORDER — BUPIVACAINE LIPOSOME 1.3 % IJ SUSP
INTRAMUSCULAR | Status: DC | PRN
  Administered 2021-10-29: 10 mL

## 2021-10-29 MED ORDER — ACETAMINOPHEN 325 MG PO TABS
650.0000 mg | ORAL_TABLET | ORAL | Status: DC | PRN
  Administered 2021-10-29 – 2021-10-30 (×4): 650 mg via ORAL
  Filled 2021-10-29 (×4): qty 2

## 2021-10-29 MED ORDER — ROSUVASTATIN CALCIUM 5 MG PO TABS
10.0000 mg | ORAL_TABLET | Freq: Every day | ORAL | Status: DC
  Administered 2021-10-31: 10 mg via ORAL

## 2021-10-29 SURGICAL SUPPLY — 108 items
AGENT HMST KT MTR STRL THRMB (HEMOSTASIS) ×4
APPLIER CLIP 11 MED OPEN (CLIP) ×2
APR CLP MED 11 20 MLT OPN (CLIP) ×2
BAG COUNTER SPONGE SURGICOUNT (BAG) ×4 IMPLANT
BAG SPNG CNTER NS LX DISP (BAG) ×2
BLADE CLIPPER SURG (BLADE) IMPLANT
BLADE SURG 10 STRL SS (BLADE) ×2 IMPLANT
CABLE BIPOLOR RESECTION CORD (MISCELLANEOUS) ×2 IMPLANT
CATH FOLEY 2WAY SLVR  5CC 14FR (CATHETERS) ×2
CATH FOLEY 2WAY SLVR  5CC 16FR (CATHETERS)
CATH FOLEY 2WAY SLVR 5CC 14FR (CATHETERS) IMPLANT
CATH FOLEY 2WAY SLVR 5CC 16FR (CATHETERS) ×2 IMPLANT
CLIP APPLIE 11 MED OPEN (CLIP) ×4 IMPLANT
CLIP LIGATING EXTRA MED SLVR (CLIP) IMPLANT
CLIP NEUROVISION LG (CLIP) IMPLANT
CLSR STERI-STRIP ANTIMIC 1/2X4 (GAUZE/BANDAGES/DRESSINGS) IMPLANT
COVER SURGICAL LIGHT HANDLE (MISCELLANEOUS) ×2 IMPLANT
DISSECTOR BLUNT TIP ENDO 5MM (MISCELLANEOUS) ×4 IMPLANT
DISSECTOR STICK (MISCELLANEOUS) IMPLANT
DRAIN CHANNEL 15F RND FF W/TCR (WOUND CARE) IMPLANT
DRAPE C-ARM 42X72 X-RAY (DRAPES) ×2 IMPLANT
DRAPE C-ARMOR (DRAPES) ×2 IMPLANT
DRAPE INCISE IOBAN 66X45 STRL (DRAPES) IMPLANT
DRSG OPSITE POSTOP 3X4 (GAUZE/BANDAGES/DRESSINGS) ×2 IMPLANT
DRSG OPSITE POSTOP 4X10 (GAUZE/BANDAGES/DRESSINGS) ×2 IMPLANT
DRSG OPSITE POSTOP 4X6 (GAUZE/BANDAGES/DRESSINGS) ×2 IMPLANT
DRSG OPSITE POSTOP 4X8 (GAUZE/BANDAGES/DRESSINGS) IMPLANT
DURAPREP 26ML APPLICATOR (WOUND CARE) ×2 IMPLANT
ELECT BLADE 4.0 EZ CLEAN MEGAD (MISCELLANEOUS) ×2
ELECT BLADE 6.5 EXT (BLADE) ×2 IMPLANT
ELECT CAUTERY BLADE 6.4 (BLADE) ×2 IMPLANT
ELECT PENCIL ROCKER SW 15FT (MISCELLANEOUS) ×2 IMPLANT
ELECT REM PT RETURN 9FT ADLT (ELECTROSURGICAL) ×2
ELECTRODE BLDE 4.0 EZ CLN MEGD (MISCELLANEOUS) ×2 IMPLANT
ELECTRODE REM PT RTRN 9FT ADLT (ELECTROSURGICAL) ×2 IMPLANT
GAUZE 4X4 16PLY ~~LOC~~+RFID DBL (SPONGE) IMPLANT
GLOVE BIO SURGEON STRL SZ 6.5 (GLOVE) ×2 IMPLANT
GLOVE BIO SURGEON STRL SZ7.5 (GLOVE) ×2 IMPLANT
GLOVE BIOGEL PI IND STRL 6.5 (GLOVE) ×2 IMPLANT
GLOVE BIOGEL PI IND STRL 8 (GLOVE) ×2 IMPLANT
GLOVE BIOGEL PI IND STRL 8.5 (GLOVE) ×2 IMPLANT
GLOVE SS BIOGEL STRL SZ 8.5 (GLOVE) ×2 IMPLANT
GOWN STRL REUS W/ TWL LRG LVL3 (GOWN DISPOSABLE) ×4 IMPLANT
GOWN STRL REUS W/ TWL XL LVL3 (GOWN DISPOSABLE) ×4 IMPLANT
GOWN STRL REUS W/TWL 2XL LVL3 (GOWN DISPOSABLE) ×4 IMPLANT
GOWN STRL REUS W/TWL LRG LVL3 (GOWN DISPOSABLE) ×4
GOWN STRL REUS W/TWL XL LVL3 (GOWN DISPOSABLE) ×4
GUIDEWIRE NITINOL BEVEL TIP (WIRE) IMPLANT
INSERT FOGARTY 61MM (MISCELLANEOUS) IMPLANT
INSERT FOGARTY SM (MISCELLANEOUS) IMPLANT
KIT BASIN OR (CUSTOM PROCEDURE TRAY) ×2 IMPLANT
KIT TURNOVER KIT B (KITS) ×2 IMPLANT
MODULE EMG NDL SSEP NVM5 (NEEDLE) IMPLANT
MODULE EMG NEEDLE SSEP NVM5 (NEEDLE) ×2 IMPLANT
MODULE NVM5 NEXT GEN EMG (NEEDLE) IMPLANT
NDL 22X1.5 STRL (OR ONLY) (MISCELLANEOUS) ×2 IMPLANT
NDL SPNL 18GX3.5 QUINCKE PK (NEEDLE) ×2 IMPLANT
NEEDLE 22X1.5 STRL (OR ONLY) (MISCELLANEOUS) ×2 IMPLANT
NEEDLE SPNL 18GX3.5 QUINCKE PK (NEEDLE) ×2 IMPLANT
NS IRRIG 1000ML POUR BTL (IV SOLUTION) ×2 IMPLANT
PACK LAMINECTOMY ORTHO (CUSTOM PROCEDURE TRAY) ×2 IMPLANT
PACK UNIVERSAL I (CUSTOM PROCEDURE TRAY) ×2 IMPLANT
PAD ARMBOARD 7.5X6 YLW CONV (MISCELLANEOUS) ×8 IMPLANT
PROBE BALL TIP NVM5 SNG USE (BALLOONS) IMPLANT
PUTTY BONE DBX 5CC MIX (Putty) IMPLANT
PUTTY DBM INSTAFILL CART 5CC (Putty) IMPLANT
ROD RELINE MAS TI 5.5X35MM LRD (Rod) IMPLANT
ROD RELINE MAS TI LORD 5.5X40 (Rod) IMPLANT
SCREW LOCK RELINE 5.5 TULIP (Screw) IMPLANT
SCREW RELINE MAS POLY 6.5X40MM (Screw) IMPLANT
SCREW RELINE RED POLY 6.5X35MM (Screw) IMPLANT
SOL ANTI FOG 6CC (MISCELLANEOUS) IMPLANT
SOLUTION ANTI FOG 6CC (MISCELLANEOUS) ×2
SPACER RISE 18X40MM 7-14MM (Spacer) IMPLANT
SPONGE INTESTINAL PEANUT (DISPOSABLE) ×4 IMPLANT
SPONGE SURGIFOAM ABS GEL 100 (HEMOSTASIS) IMPLANT
SPONGE T-LAP 18X18 ~~LOC~~+RFID (SPONGE) ×4 IMPLANT
SPONGE T-LAP 4X18 ~~LOC~~+RFID (SPONGE) ×2 IMPLANT
STAPLER VISISTAT 35W (STAPLE) ×2 IMPLANT
SURGIFLO W/THROMBIN 8M KIT (HEMOSTASIS) IMPLANT
SUT BONE WAX W31G (SUTURE) IMPLANT
SUT MNCRL AB 3-0 PS2 27 (SUTURE) ×4 IMPLANT
SUT MNCRL+ AB 3-0 CT1 36 (SUTURE) IMPLANT
SUT MONOCRYL AB 3-0 CT1 36IN (SUTURE) ×2
SUT PDS AB 1 CTX 36 (SUTURE) ×2 IMPLANT
SUT PROLENE 4 0 RB 1 (SUTURE)
SUT PROLENE 4-0 RB1 .5 CRCL 36 (SUTURE) IMPLANT
SUT PROLENE 5 0 C 1 24 (SUTURE) ×2 IMPLANT
SUT PROLENE 5 0 CC1 (SUTURE) IMPLANT
SUT PROLENE 6 0 C 1 30 (SUTURE) IMPLANT
SUT PROLENE 6 0 CC (SUTURE) IMPLANT
SUT SILK 0 TIES 10X30 (SUTURE) IMPLANT
SUT SILK 2 0 TIES 10X30 (SUTURE) ×4 IMPLANT
SUT SILK 2 0SH CR/8 30 (SUTURE) IMPLANT
SUT SILK 3 0 TIES 10X30 (SUTURE) ×2 IMPLANT
SUT SILK 3 0 TIES 17X18 (SUTURE)
SUT SILK 3 0SH CR/8 30 (SUTURE) IMPLANT
SUT SILK 3-0 18XBRD TIE BLK (SUTURE) IMPLANT
SUT VIC AB 1 CT1 18XCR BRD 8 (SUTURE) ×4 IMPLANT
SUT VIC AB 1 CT1 8-18 (SUTURE) ×4
SUT VIC AB 2-0 CT1 18 (SUTURE) ×4 IMPLANT
SYR 20CC LL (SYRINGE) IMPLANT
SYR BULB IRRIG 60ML STRL (SYRINGE) ×2 IMPLANT
TAPE CLOTH 4X10 WHT NS (GAUZE/BANDAGES/DRESSINGS) ×6 IMPLANT
TIP CONICAL INSTAFILL (ORTHOPEDIC DISPOSABLE SUPPLIES) IMPLANT
TOWEL GREEN STERILE (TOWEL DISPOSABLE) ×4 IMPLANT
TOWEL GREEN STERILE FF (TOWEL DISPOSABLE) ×2 IMPLANT
WATER STERILE IRR 1000ML POUR (IV SOLUTION) ×2 IMPLANT

## 2021-10-29 NOTE — Anesthesia Procedure Notes (Signed)
Procedure Name: Intubation Date/Time: 10/29/2021 8:00 AM  Performed by: Jenne Campus, CRNAPre-anesthesia Checklist: Patient identified, Emergency Drugs available, Suction available and Patient being monitored Patient Re-evaluated:Patient Re-evaluated prior to induction Oxygen Delivery Method: Circle System Utilized Preoxygenation: Pre-oxygenation with 100% oxygen Induction Type: IV induction Ventilation: Mask ventilation without difficulty Laryngoscope Size: Miller and 2 Grade View: Grade II Tube type: Oral Tube size: 7.0 mm Number of attempts: 1 Airway Equipment and Method: Stylet and Oral airway Placement Confirmation: ETT inserted through vocal cords under direct vision, positive ETCO2 and breath sounds checked- equal and bilateral Secured at: 21 cm Tube secured with: Tape Dental Injury: Teeth and Oropharynx as per pre-operative assessment

## 2021-10-29 NOTE — Op Note (Signed)
Date: October 29, 2021  Preoperative diagnosis: Chronic lower back pain  Postoperative diagnosis: Same  Procedure: Spine exposure at the L4-L5 disc space via oblique retroperitoneal approach for L4-L5 OLIF  Surgeon: Dr. Marty Heck, MD  Co-surgeon: Dr. Melina Schools, MD  Indications: Patient is a 71 year old female with chronic severe lower back pain.  She has been evaluated by Dr. Rolena Infante who has recommended an L4-L5 OLIF.  Vascular surgery was asked to assist with spine exposure after risk benefits discussed.  Findings: Patient was placed in the right lateral position and all pressure points padded.  The L4-L5 disc space was marked over the left oblique muscles approximately two fingerbreadths off the left iliac crest.  An oblique incision was made and we performed a muscle-sparing technique between the layers of the oblique muscles and then entered the retroperitoneum after mobilizing the peritoneum and left ureter down to get to the L4-L5 disc space.  The left psoas muscle was mobilized lateral to the disc space to get good working room.  A fixed NuVasive retractor was placed.  We confirmed we were at the correct level with a fluoroscopic C arm and a needle in the disc space.  Anesthesia: General  Details: Patient was taken to the operating room after informed consent was obtained.  General endotracheal anesthesia was induced.  She was then rolled in the right lateral position and all pressure points were padded.  Fluoroscopic C-arm was then used to mark the L4-5 disc space over the left oblique muscles approximately two fingerbreadths off the left iliac crest.  The abdominal wall, left flank, and back were then prepped and draped in standard sterile fashion.  Antibiotics were given.  Timeout performed.  Initially made our incision at the preoperative mark over the left oblique muscles.  Used cerebellar retractors and dissected through the subcutaneous tissue.  The external oblique  fascia was then opened with Bovie cautery and we performed a muscle-sparing technique between the layers of the oblique muscles then visualized the peritoneum and left ureter that was swept down with blunt dissection.  We then saw the left psoas muscle.  We used hand-held Wiley retractors to pull the left psoas lateral to the disc space.  The left iliac vessels were visualized and protected.  I then used laparoscopic dissector with a Kd and suction to mobilize the left psoas further lateral until we had good working room for an oblique approach.  Fixed NuVasive retractor was placed on the field and we placed a 140 reverse lip to pull the iliac vessels down and away from the disc space.  Two 140 reverse lip retractors were also used to pull the left psoas muscle lateral to the disc space.  We had good exposure.  A spinal needle was placed in the disc space.  We confirmed on lateral fluoroscopy we were at the correct L4-L5 level.  Case was turned over to Dr. Rolena Infante.  Complication: None  Condition: Stable  Marty Heck, MD [facility] Office: [place]

## 2021-10-29 NOTE — Op Note (Signed)
OPERATIVE REPORT  DATE OF SURGERY: 10/29/2021  PATIENT NAME:  Destiny Roth MRN: [id number] DOB: 03-17-1950  PCP: Leeroy Cha, MD  PRE-OPERATIVE DIAGNOSIS: Degenerative grade 2 spondylohesis L4-5.  Neuropathic leg pain  POST-OPERATIVE DIAGNOSIS: Same  PROCEDURE:   Oblique lumbar interbody fusion L4-5.  Posterior pedicle screw fixation L4-5  SURGEON:  Melina Schools, MD Approach surgeon: Dr. Monica Martinez  PHYSICIAN ASSISTANT: None  ANESTHESIA:   General  EBL: 60 ml   Complications: None  Implants: Globus Rise-L bleak lumbar interbody cage.  7 to 14 mm 0 degree lordosis.  40 mm by 18 mm.  It was expanded to approximately 11.5 mm.  NuVasive MIS pedicle screw fixation.  6.5 x 40 mm length screws at L4 4, 6.5 x 35 at L5.  Graft: DBX mix, DBX  BRIEF HISTORY: Destiny Roth is a 71 y.o. female who presented to my office with complaints of severe back buttock and radicular leg pain.  Attempts at conservative management have failed to alleviate her symptoms or improve her quality of life.  As a result we elected to move forward with surgery.  All appropriate risks, benefits, and alternatives were discussed with the patient and consent was obtained.  PROCEDURE DETAILS: Patient was brought into the operating room and was properly positioned on the operating room table.  After induction with general anesthesia the patient was endotracheally intubated.  A timeout was taken to confirm all important data: including patient, procedure, and the level. Teds, SCD's were applied.   A Foley was placed and the record stimulator rep/neuromonitoring placed all appropriate needles for SSEP and free running EMG monitoring.  Patient was turned into the lateral decubitus position on the operating room table.  Axillary roll was placed and all bony prominences well-padded.  The left upper extremity was placed in the arm holder.  Using fluoroscopy I made sure the patient was properly  prepped.  Images of the L4-5 level.  Patient was then secured to the table with tape to prevent motion.  The abdomen and lumbar spine were prepped and draped in a standard fashion.  At this point time Dr. Carlis Abbott scrubbed into the case and I assisted him in performing an oblique trocar needle approach to the lumbar spine.  Please refer to his dictation for specifics on the approach.  Once the retractors were properly positioned and needle was placed into the L4-5 disc space and an x-ray was taken confirming that I was at the appropriate level.  This point Dr. Carlis Abbott scrubbed out and I continued with the fusion.    Annulotomy was performed with a 10 blade scalpel and then using Cobb elevators I released the disc from the endplate and released the contralateral annulus.  I then used curettes, pituitary rongeurs, and Kerrison rongeurs to remove all of the disc material.  I then used a fine nerve hook to develop a plane behind the vertebral body of L4 and L5 and then I released the annulus from behind the vertebral bodies with a small curved curette.  The posterior osteophyte was taken down with 2 mm Kerrison rongeurs.  I confirmed that adequately decompressed from pedicle to pedicle using AP fluoroscopy.  I then rasped the endplates to ensure had bleeding subchondral bone.  The trial intervertebral device was then placed and elected to use the 40 mm length 7 to 14 mm 0 degree lordotic cage.  The cage was packed with DBX mix and then gently inserted into the position.  I confirmed satisfactory  position in both planes.  I then expanded the cage to a final height of approximately 11.5 mm.  I then remove the inserting device and then placed additional bone graft to backfilled the cage.  DBX was placed along the anterior margin of the cage to assist in a anterior sentinel fusion.  X-rays were taken which demonstrated satisfactory position of the cage in both planes.  The wound was copiously irrigated with normal saline.  I  then removed the retractors and closed the fascia of the external oblique with interrupted #1 Vicryl sutures.  I then closed with interrupted 2 layer Vicryl's stitch and then a 3-0 Monocryl for the skin.  Steri-Strips were applied.  At this point x-ray took a final AP x-ray and was read by the radiologist.  There were no retained surgical instruments in the field.  At this point I turned my attention to the posterior pedicle screw fixation.  With the patient still in the lateral decubitus position I went to the back and visualize the lumbar spine.  I then used a Jamshidi needle to mark out the lateral aspect of the L4 and L5 pedicles bilaterally.  I infiltrated this area with quarter percent Marcaine with epinephrine.  Incision was made and I advanced the Jamshidi needle down to the lateral aspect of the L4 pedicle.  I confirmed satisfactory position with AP fluoroscopy.  With the needle connected to the neuro monitoring device I then advanced the Jamshidi needle with direct neural stimulation as well as fluoroscopy.  As I neared the medial wall of the pedicle on the AP view I switched to the lateral view to confirm that I was just beyond the posterior wall.  Once I confirmed satisfactory trajectory and position I advanced into the vertebral body I then placed the guidepin to cannulate the pedicle.  Using this exact same technique I cannulated the other 3 pedicles.  Once all 4 pedicles were cannulated I then measured and placed the pedicle screws over the guidepin.  Pedicles had all excellent purchase.  I then directly stimulated each of the pedicles and there was no adverse activity greater than 40 mA.  I then measured and obtained the appropriate size rod.  I passed the rod from L4-L5 and then inserted the locking caps.  The locking caps were tightened and then ultimately torqued core to manufacture standards.  Final imaging demonstrated satisfactory position of the hardware.  I then removed the insertion tabs  and irrigated the wounds copiously with normal saline.  The deep fascia was closed with interrupted #1 Vicryl sutures, superficial 2-0 Vicryl suture, and 3-0 Monocryl for the skin.  Both wounds were cleaned and Steri-Strips were applied as were dry dressings.  Dry dressing was also applied to the oblique anterior incision.  At this point the patient was ultimately extubated and transferred the PACU without incident.  The end of the case all needle sponge counts were correct.  There were no adverse intraoperative events.    Melina Schools, MD 10/29/2021 11:39 AM

## 2021-10-29 NOTE — H&P (Signed)
History:  Destiny Roth is a very pleasant 71 year old woman with multiple medical issues who presents with ongoing severe low back pain with occasional radiation into the lower extremity. Patient states their pain level is 9/10. The pain significantly interferes with activities of daily living as well as overall quality of life. As a result we are electing to move forward with an L4-5 anterior posterior fusion.  Past Medical History:  Diagnosis Date   Abdominal pain, epigastric 09/18/2009   Anosmia 11/06/2010   ANXIETY 09/23/2006   BUNION, RIGHT FOOT 09/18/2009   CARPAL TUNNEL SYNDROME, BILATERAL 09/23/2006   DEPRESSION 09/23/2006   DIVERTICULOSIS, COLON 09/23/2006   GERD 05/24/2008   GLUCOSE INTOLERANCE 05/24/2008   Hemorrhoids    Hypercalcemia    HYPERLIPIDEMIA 09/23/2006   HYPERTENSION 09/23/2006   IBS 09/23/2006   Impaired glucose tolerance 11/04/2010   OSTEOPOROSIS 05/24/2008   06/01/19-pt states osteopenia   Parkinson's disease    Restrictive lung disease 12/20/2013   Sarcoidosis 02/26/2019    Allergies  Allergen Reactions   Myrbetriq [Mirabegron] Other (See Comments)    SEVERE muscle and joint pain   Atorvastatin Other (See Comments)    Muscle pain Muscle pain myalgia   Zocor [Simvastatin] Other (See Comments)    Muscle pain    No current facility-administered medications on file prior to encounter.   Current Outpatient Medications on File Prior to Encounter  Medication Sig Dispense Refill   Biotin 1 MG CAPS Take 1 mg by mouth daily at 2 PM. 30 capsule 5   carbidopa-levodopa (SINEMET IR) 25-100 MG tablet Take 0.5-1 tablets by mouth See admin instructions. Take 1 tablet by mouth in the morning and then take half tablet by mouth at noon, and 4 pm and at bedtime per patient     Carbidopa-Levodopa ER (SINEMET CR) 25-100 MG tablet controlled release Take 1 tablet by mouth 2 (two) times daily.     Cholecalciferol (VITAMIN D3) 50 MCG (2000 UT) capsule Take 2,000 Units by mouth daily.      CRANBERRY PO Take 1 tablet by mouth in the morning and at bedtime.     diphenhydramine-acetaminophen (TYLENOL PM) 25-500 MG TABS tablet Take 1 tablet by mouth at bedtime as needed (For sleep).     Melatonin 5 MG CAPS Take 1 capsule by mouth at bedtime.     rOPINIRole (REQUIP) 1 MG tablet Take 1 tablet (1 mg total) by mouth 3 (three) times daily. 90 tablet 0   rosuvastatin (CRESTOR) 10 MG tablet TAKE 1 TABLET BY MOUTH EVERY DAY 90 tablet 1   senna (SENOKOT) 8.6 MG TABS tablet Take 1 tablet (8.6 mg total) by mouth 2 (two) times daily. 30 tablet 0   cephALEXin (KEFLEX) 500 MG capsule Take 1 capsule (500 mg total) by mouth 2 (two) times daily. (Patient not taking: Reported on 10/23/2021) 10 capsule 0    Physical Exam: Vitals:   10/29/21 0603  BP: (!) 114/96  Pulse: 95  Resp: 18  Temp: 98.1 F (36.7 C)  SpO2: 97%   Body mass index is 26.13 kg/m.  Clinical exam: Destiny Roth is a pleasant individual, who appears younger than their stated age.  She is alert and orientated 3.  No shortness of breath, chest pain.  Abdomen is soft and non-tender, negative loss of bowel and bladder control, no rebound tenderness.  Negative: skin lesions abrasions contusions  Peripheral pulses: 2+ dorsalis pedis/posterior tibialis pulses bilaterally. LE compartments are: Soft and nontender.  Gait pattern: Normal  Assistive  devices: None  Neuro: Intermittent dysesthesias into the right lower extremity. 5/5 motor strength throughout the lower extremity. Negative Babinski test, no clonus, negative straight leg raise test.  Musculoskeletal: Significant back pain with palpation and range of motion. No SI joint pain.  X-rays of the lumbar spine demonstrate degenerative spondylolisthesis at L4-5 with loss of normal disc space height.  Lumbar MRI: completed on 07/21/2021. Grade 1 borderline 2 degenerative spondylolisthesis at L4-5 with severe facet arthropathy contributing to moderate to severe lateral recess  stenosis with bilateral L5 nerve root compression. Moderate by foraminal stenosis causing bilateral L4 nerve irritation. Small left L3-4 disc protrusion which contacts but does not displace the L4 traversing nerve root. Severe right facet arthropathy at L5-S1. No fracture or abnormal marrow signal change.   A/P: Summary of Treatment Plan: 1. Diagnosis: Degenerative grade 1 borderline 2 spondylolisthesis L4-5 with back buttock and radicular leg pain 2. Tobacco use: None 3. Conservative treatment: Physical [facility]: [facility] in the summer 2022 Injection therapy: Patient has had 2 epidural steroid injections this year both with minimal overall improvement 4. Imaging studies: X-ray findings: Grade 1 borderline 2 degenerative spondylolisthesis L4-5 MRI findings: Grade 1 borderline 2 degenerative spondylolisthesis at L4-5 with severe facet arthropathy contributing to moderate to severe lateral recess stenosis with bilateral L5 nerve root compression. Moderate by foraminal stenosis causing bilateral L4 nerve irritation. Small left L3-4 disc protrusion which contacts but does not displace the L4 traversing nerve root. Severe right facet arthropathy at L5-S1. No fracture or abnormal marrow signal change. 5. Surgical plan: Oblique lumbar interbody fusion L4-5 with supplemental posterior pedicle screw fixation and posterior lateral arthrodesis 6. Implants: Manufacturer: [employer] expandable OLIF cage. NuVasive cortical pedicle screws Bone graft: DBX mix and autograft  Diagnosis: Patient has ongoing significant back pain despite appropriate conservative management. As result we will move forward with surgery. I have gone over the procedure in great detail. In order to address the radicular leg pain and back pain I am recommending an instrumented fusion. This would prevent worsening of her spondylolisthesis and stabilization of the L4-5 level.  OLIF/XLIF  risks, benefits of surgery were reviewed with the patient. These include: infection, bleeding, death, stroke, paralysis, ongoing or worse pain, need for additional surgery, injury to the lumbar plexus resulting in hip flexor weakness and difficulty walking without assistive devices. Adjacent segment degenerative disease, need for additional surgery including fusing other levels, leak of spinal fluid, Nonunion, hardware failure, breakage, or mal-position. Deep venous thrombosis (DVT) requiring additional treatment such as filter, and/or medications. Injury to abdominal contents, loss in bowel and bladder control. Risks and benefits of spinal fusion: Infection, bleeding, death, stroke, paralysis, ongoing or worse pain, need for additional surgery, nonunion, leak of spinal fluid, adjacent segment degeneration requiring additional fusion surgery, Injury to abdominal vessels that can require anterior surgery to stop bleeding. Malposition of the cage and/or pedicle screws that could require additional surgery. Loss of bowel and bladder control. Postoperative hematoma causing neurologic compression that could require urgent or emergent re-operation.

## 2021-10-29 NOTE — Discharge Instructions (Addendum)
Spinal Fusion, Adult, Care After This sheet gives you information about how to care for yourself after your procedure. Your doctor may also give you more specific instructions. If you have problems or questions, contact your doctor. Follow these instructions at home: Medicines Take over-the-counter and prescription medicines only as told by your doctor. These include any medicines for pain or blood-thinning medicines (anticoagulants). If you were prescribed an antibiotic medicine, take it as told by your doctor. Do not stop taking the antibiotic even if you start to feel better. Do not drive for 24 hours if you were given a medicine to help you relax (sedative) during your procedure. Do not drive or use heavy machinery while taking prescription pain medicine. If you have a brace: Wear the brace as told by your doctor. Take it off only as told by your doctor. Keep the brace clean. Managing pain, stiffness, and swelling If directed, put ice on the surgery area: If you have a removable brace, take it off as told by your doctor. Put ice in a plastic bag. Place a towel between your skin and the bag. Leave the ice on for 20 minutes, 2-3 times a day. Surgery cut care    Follow instructions from your doctor about how to take care of your cut from surgery (incision). Make sure you: Wash your hands with soap and water before you change your bandage (dressing). If you cannot use soap and water, use hand sanitizer. Change your bandage as told by your doctor. Leave stitches (sutures), skin glue, or skin tape (adhesive) strips in place. They may need to stay in place for 2 weeks or longer. If tape strips get loose and curl up, you may trim the loose edges. Do not remove tape strips completely unless your doctor says it is okay. Keep your cut from surgery clean and dry. Do not take baths, swim, or use a hot tub until your doctor says it is okay. Ask your doctor if you can take showers. You may  only be allowed to take sponge baths. Every day, check your cut from surgery and the area around it for: More redness, swelling, or pain. Fluid or blood. Warmth. Pus or a bad smell. If you have a drain tube, follow instructions from your doctor about caring for it. Do not take out the drain tube or any bandages unless your doctor says it is okay. Physical activity Rest and protect your back as much as possible. Follow instructions from your doctor about how to move. Use good posture to help your spine heal. Do not lift anything that is heavier than 8 lb (3.6 kg), or the limit that you are told, until your doctor says that it is safe. Do not twist or bend at the waist until your doctor says it is okay. It is best if you: Do not make pushing and pulling motions. Do not sit or lie down in the same position for a long time. Do not raise your hands or arms above your head. Return to your normal activities as told by your doctor. Ask your doctor what activities are safe for you. Rest and protect your back as much as you can. Do not start to exercise until your doctor says it is okay. Ask your doctor what kinds of exercise you can do to make your back stronger. Ok to shower in 5 days.  Do not take a bath or submerge the wound General instructions To prevent blood clots and  lessen swelling in your legs: Wear compression stockings as told. Walk one or more times every few hours as told by your doctor. Do not use any products that contain nicotine or tobacco, such as cigarettes and e-cigarettes. These can delay bone healing. If you need help quitting, ask your doctor. To prevent or treat constipation while you are taking prescription pain medicine, your doctor may suggest that you: Drink enough fluid to keep your pee (urine) pale yellow. Take over-the-counter or prescription medicines. Eat foods that are high in fiber. These include fresh fruits and vegetables, whole grains, and beans. Limit foods  that are high in fat and processed sugars, such as fried and sweet foods. Keep all follow-up visits as told by your doctor. This is important. Contact a doctor if: Your pain gets worse. Your medicine does not help your pain. Your legs or feet get painful or swollen. Your cut from surgery is more red, swollen, or painful. Your cut from surgery feels warm to the touch. You have: Fluid or blood coming from your cut from surgery. Pus or a bad smell coming from your cut from surgery. A fever. Weakness or loss of feeling (numbness) in your legs that is new or getting worse. Trouble controlling when you pee (urinate) or poop (have a bowel movement). You feel sick to your stomach (nauseous). You throw up (vomit). Get help right away if: Your pain is very bad. You have chest pain. You have trouble breathing. You start to have a cough. These symptoms may be an emergency. Do not wait to see if the symptoms will go away. Get medical help right away. Call your local emergency services (911 in the [place] Do not drive yourself to the hospital. Summary After the procedure, it is common to have pain in your back and pain by your surgery cut(s). Icing and pain medicines may help to control the pain. Follow directions from your doctor. Rest and protect your back as much as possible. Do not twist or bend at the waist. Get up and walk one or more times every few hours as told by your doctor. This information is not intended to replace advice given to you by your health care provider. Make sure you discuss any questions you have with your health care provider.   Restart ASA on Friday 11/02/21 Call Primary Care provider and ask when to restart Norvasc,  and Methotrexate

## 2021-10-29 NOTE — Anesthesia Postprocedure Evaluation (Signed)
Anesthesia Post Note  Patient: Destiny Roth  Procedure(s) Performed: OBLIQUE LUMBAR INTERBODY FUSION WITH SUPPLEMENTAL PEDICLE SCREW FIXATION LUMBAR FOUR TO FIVE (Spine Lumbar) LATERAL EXPOSURE FOR OBLIQUE SPINE SURGERY (Abdomen)     Patient location during evaluation: PACU Anesthesia Type: General Level of consciousness: awake and alert Pain management: pain level controlled Vital Signs Assessment: post-procedure vital signs reviewed and stable Respiratory status: spontaneous breathing, nonlabored ventilation, respiratory function stable and patient connected to nasal cannula oxygen Cardiovascular status: blood pressure returned to baseline and stable Postop Assessment: no apparent nausea or vomiting Anesthetic complications: no   No notable events documented.  Last Vitals:  Vitals:   10/29/21 1315 10/29/21 1338  BP: (!) 128/91 (!) 153/82  Pulse: 79 85  Resp: 13 16  Temp:  36.8 C  SpO2: 97% 99%    Last Pain:  Vitals:   10/29/21 1338  TempSrc: Oral  PainSc:                  [name]

## 2021-10-29 NOTE — H&P (Signed)
History and Physical Interval Note:  10/29/2021 7:33 AM  Destiny Roth  has presented today for surgery, with the diagnosis of Degenerative grade 1 spondylothesis with radiculopathy L4-5.  The various methods of treatment have been discussed with the patient and family. After consideration of risks, benefits and other options for treatment, the patient has consented to  Procedure(s): OBLIQUE LUMBAR INTERBODY FUSION 1 LEVEL WITH PERCUTANEOUS SCREWS (OLIF L4-5, POSTERIOR SPINAL FUSION INTERBODY L4-5) (N/A) ABDOMINAL EXPOSURE (N/A) as a surgical intervention.  The patient's history has been reviewed, patient examined, no change in status, stable for surgery.  I have reviewed the patient's chart and labs.  Questions were answered to the patient's satisfaction.    Abdominal exposure for L4-L5 OLIF.  Risk and benefits again discussed.  Marty Heck     Patient name: Destiny Roth    MRN: [id number]        DOB: May 24, 1950          Sex: female   REASON FOR CONSULT: Evaluate for abdominal exposure for OLIF L4-L5   HPI: Destiny Roth is a 71 y.o. female, with history of hypertension, Parkinson's disease, sarcoidosis and chronic lower back pain that presents for evaluation of abdominal exposure for L4-L5 OLIF.  She describes years of lower back pain with some radicular symptoms.  This is worse over the last several months.  She has failed conservative management.  She has been evaluated Dr. Rolena Infante and vascular surgery was asked to assist with spine exposure for L4-L5 OLIF.  Previous abdominal surgery includes tubal ligation.  MRI imaging shows degenerative spondylolisthesis at L4-L5.     Past Medical History:  Diagnosis Date   Abdominal pain, epigastric 09/18/2009   Anosmia 11/06/2010   ANXIETY 09/23/2006   BUNION, RIGHT FOOT 09/18/2009   CARPAL TUNNEL SYNDROME, BILATERAL 09/23/2006   DEPRESSION 09/23/2006   DIVERTICULOSIS, COLON 09/23/2006   GERD 05/24/2008   GLUCOSE INTOLERANCE  05/24/2008   Hemorrhoids     Hypercalcemia     HYPERLIPIDEMIA 09/23/2006   HYPERTENSION 09/23/2006   IBS 09/23/2006   Impaired glucose tolerance 11/04/2010   OSTEOPOROSIS 05/24/2008    06/01/19-pt states osteopenia   Parkinson's disease ([place])     Restrictive lung disease 12/20/2013   Sarcoidosis 02/26/2019           Past Surgical History:  Procedure Laterality Date   CARDIAC CATHETERIZATION       CATARACT EXTRACTION       COLONOSCOPY   2008   COLONOSCOPY W/ BIOPSIES   2008    Dr. Collene Mares -negative random bxs   ESOPHAGOGASTRODUODENOSCOPY   2008    Dr. Harriette Bouillon duodenal ulcer   FOOT SURGERY Bilateral 2015   LEFT AND RIGHT HEART CATHETERIZATION WITH CORONARY ANGIOGRAM N/A 11/09/2013    Procedure: LEFT AND RIGHT HEART CATHETERIZATION WITH CORONARY ANGIOGRAM;  Surgeon: Peter M Martinique, MD;  Location: [facility] CATH LAB;  Service: Cardiovascular;  Laterality: N/A;   nasal septum repair       s/p bilat CTS       s/p fibroid ablation       s/p ganglion cyst right wrist       s/p right thumb trigger finger surgury       SHOULDER SURGERY Right 2015   TUBAL LIGATION       UPPER GASTROINTESTINAL ENDOSCOPY               Family History  Problem Relation Age of Onset   Peripheral vascular disease Mother  with bilat amputations   Heart attack Mother     Hypertension Father     Heart disease Father 77   Hypertension Brother     Heart attack Brother     Hypertension Brother     Hypertension Brother     Colon cancer Neg Hx     Colon polyps Neg Hx     Esophageal cancer Neg Hx     Rectal cancer Neg Hx     Stomach cancer Neg Hx        SOCIAL HISTORY: Social History         Socioeconomic History   Marital status: Married      Spouse name: Not on file   Number of children: 2   Years of education: Not on file   Highest education level: Not on file  Occupational History   Not on file  Tobacco Use   Smoking status: Never      Passive exposure: Yes   Smokeless tobacco: Never   Vaping Use   Vaping Use: Never used  Substance and Sexual Activity   Alcohol use: Not Currently      Comment: Occ   Drug use: No   Sexual activity: Yes      Partners: Male      Birth control/protection: Post-menopausal  Other Topics Concern   Not on file  Social History Narrative   Not on file    Social Determinants of Health    Financial Resource Strain: Not on file  Food Insecurity: Not on file  Transportation Needs: Not on file  Physical Activity: Not on file  Stress: Not on file  Social Connections: Not on file  Intimate Partner Violence: Not on file           Allergies  Allergen Reactions   Myrbetriq [Mirabegron] Other (See Comments)      SEVERE muscle and joint pain   Atorvastatin Other (See Comments)      Muscle pain Muscle pain myalgia   Zocor [Simvastatin] Other (See Comments)      Muscle pain            Current Outpatient Medications  Medication Sig Dispense Refill   acetaminophen (TYLENOL) 500 MG tablet Take 500 mg by mouth every 6 (six) hours as needed for mild pain.       Adalimumab (HUMIRA PEN) 40 MG/0.8ML PNKT Inject 0.4 mLs into the skin every 14 (fourteen) days.       Amantadine HCl 100 MG tablet Take 100 mg by mouth in the morning, at noon, and at bedtime.       amLODipine (NORVASC) 2.5 MG tablet Take 1 tablet by mouth daily.       aspirin EC 81 MG tablet Take 81 mg by mouth daily.       Biotin 1 MG CAPS Take 1 mg by mouth daily at 2 PM. (Patient not taking: Reported on 10/19/2021) 30 capsule 5   carbidopa-levodopa (SINEMET IR) 25-100 MG tablet Take 0.5-1 tablets by mouth See admin instructions. Take 1 tablet by mouth in the morning and then take half tablet by mouth at noon, and 4 pm and at bedtime per patient       Carbidopa-Levodopa ER (SINEMET CR) 25-100 MG tablet controlled release Take 1 tablet by mouth 2 (two) times daily.       cephALEXin (KEFLEX) 500 MG capsule Take 1 capsule (500 mg total) by mouth 2 (two) times daily. (Patient not taking:  Reported on 10/19/2021) 10  capsule 0   Cholecalciferol (VITAMIN D3) 50 MCG (2000 UT) capsule Take 2,000 Units by mouth daily.       CRANBERRY PO Take 1 tablet by mouth in the morning and at bedtime.       diphenhydramine-acetaminophen (TYLENOL PM) 25-500 MG TABS tablet Take 1 tablet by mouth at bedtime as needed (For sleep).       folic acid (FOLVITE) 1 MG tablet Take 1 tablet by mouth daily.       gabapentin (NEURONTIN) 100 MG capsule Take 2 capsules by mouth See admin instructions. Take two in AM 1 at noon 1 at 4pm and two at bedtime       Melatonin 5 MG CAPS Take 1 capsule by mouth at bedtime.       methotrexate (RHEUMATREX) 10 MG tablet Take by mouth. (Patient not taking: Reported on 10/19/2021)       methotrexate (RHEUMATREX) 2.5 MG tablet TAKE 4 TABLETS (10 MG TOTAL) BY MOUTH ONCE A WEEK. CAUTION:CHEMOTHERAPY. PROTECT FROM LIGHT. 48 tablet 0   rOPINIRole (REQUIP) 1 MG tablet Take 1 tablet (1 mg total) by mouth 3 (three) times daily. 90 tablet 0   rosuvastatin (CRESTOR) 10 MG tablet TAKE 1 TABLET BY MOUTH EVERY DAY 90 tablet 1   rosuvastatin (CRESTOR) 5 MG tablet Take by mouth. (Patient not taking: Reported on 10/19/2021)       senna (SENOKOT) 8.6 MG TABS tablet Take 1 tablet (8.6 mg total) by mouth 2 (two) times daily. (Patient not taking: Reported on 10/19/2021) 30 tablet 0   sertraline (ZOLOFT) 100 MG tablet Take 1 tablet by mouth daily.       umeclidinium bromide (INCRUSE ELLIPTA) 62.5 MCG/ACT AEPB 1 puff daily.        No current facility-administered medications for this visit.      REVIEW OF SYSTEMS:  '[X]'$  denotes positive finding, '[ ]'$  denotes negative finding Cardiac   Comments:  Chest pain or chest pressure:      Shortness of breath upon exertion:      Short of breath when lying flat:      Irregular heart rhythm:             Vascular      Pain in calf, thigh, or hip brought on by ambulation:      Pain in feet at night that wakes you up from your sleep:       Blood clot in your  veins:      Leg swelling:              Pulmonary      Oxygen at home:      Productive cough:       Wheezing:              Neurologic      Sudden weakness in arms or legs:       Sudden numbness in arms or legs:       Sudden onset of difficulty speaking or slurred speech:      Temporary loss of vision in one eye:       Problems with dizziness:              Gastrointestinal      Blood in stool:       Vomited blood:              Genitourinary      Burning when urinating:       Blood in urine:  Psychiatric      Major depression:              Hematologic      Bleeding problems:      Problems with blood clotting too easily:             Skin      Rashes or ulcers:             Constitutional      Fever or chills:          PHYSICAL EXAM: There were no vitals filed for this visit.   GENERAL: The patient is a well-nourished female, in no acute distress. The vital signs are documented above. CARDIAC: There is a regular rate and rhythm.  VASCULAR:  Palpable femoral pulses bilaterally Palpable AT pulses bilaterally PULMONARY: No respiratory distress. ABDOMEN: Soft and non-tender. MUSCULOSKELETAL: There are no major deformities or cyanosis. NEUROLOGIC: No focal weakness or paresthesias are detected. SKIN: There are no ulcers or rashes noted. PSYCHIATRIC: The patient has a normal affect.   DATA:    MRI Lumbar 07/21/21      Assessment/Plan:   71 y.o. female, with history of hypertension, Parkinson's disease, sarcoidosis and chronic lower back pain that presents for evaluation of abdominal exposure for L4-L5 OLIF.  I have reviewed her MRI and I think she would be a good candidate for OLIF.  I discussed her being in the right lateral position and then making an incision over the left oblique muscles 2 fingerbreadths off the left iliac crest to then perform a muscle-sparing technique between the oblique muscles and then mobilizing the peritoneum to get into the  retroperitoneum and mobilized the left psoas as well as possible the iliac vessels to get to the L4-L5 disc space.  Discussed risk of possible injury to both structures.  Look forward to assisting Dr. Rolena Infante.  All questions answered.  She is scheduled for Monday.     Marty Heck, MD [facility] Office: [phone]

## 2021-10-29 NOTE — OR Nursing (Signed)
No foreign objects seen on closing intra op x-ray per Dr. Burt Ek, Radiologist

## 2021-10-29 NOTE — Brief Op Note (Signed)
10/29/2021  11:50 AM  PATIENT:  Destiny Roth  71 y.o. female  PRE-OPERATIVE DIAGNOSIS:  Degenerative grade 1 spondylothesis with radiculopathy L4-5  POST-OPERATIVE DIAGNOSIS:  Degenerative grade 1 spondylothesis with radiculopathy L4-5  PROCEDURE:  Procedure(s): OBLIQUE LUMBAR INTERBODY FUSION WITH SUPPLEMENTAL PEDICLE SCREW FIXATION LUMBAR FOUR TO FIVE (N/A) LATERAL EXPOSURE FOR OBLIQUE SPINE SURGERY (N/A)  SURGEON:  Surgeon(s) and Role: Panel 1:    Melina Schools, MD - Primary Panel 2:    Marty Heck, MD - Primary  PHYSICIAN ASSISTANT:   ASSISTANTS: none   ANESTHESIA:   general  EBL:  60 mL   BLOOD ADMINISTERED:none  DRAINS: none   LOCAL MEDICATIONS USED:  MARCAINE     SPECIMEN:  No Specimen  DISPOSITION OF SPECIMEN:  N/A  COUNTS:  YES  TOURNIQUET:  * No tourniquets in log *  DICTATION: .Dragon Dictation  PLAN OF CARE: Admit to inpatient   PATIENT DISPOSITION:  PACU - hemodynamically stable.

## 2021-10-29 NOTE — Anesthesia Procedure Notes (Addendum)
Anesthesia Regional Block: TAP block   Pre-Anesthetic Checklist: , timeout performed,  Correct Patient, Correct Site, Correct Laterality,  Correct Procedure, Correct Position, site marked,  Risks and benefits discussed,  Surgical consent,  Pre-op evaluation,  At surgeon's request and post-op pain management  Laterality: Left  Prep: chloraprep       Needles:  Injection technique: Single-shot  Needle Type: Echogenic Stimulator Needle     Needle Length: 9cm  Needle Gauge: 21     Additional Needles:   Procedures:,,,, ultrasound used (permanent image in chart),,    Narrative:  Start time: 10/29/2021 7:25 AM End time: 10/29/2021 7:30 AM Injection made incrementally with aspirations every 5 mL.  Performed by: Personally  Anesthesiologist: [name], MD  Additional Notes: Patient tolerated the procedure well. Local anesthetic introduced in an incremental fashion under minimal resistance after negative aspirations. No paresthesias were elicited. After completion of the procedure, no acute issues were identified and patient continued to be monitored by RN.

## 2021-10-29 NOTE — Transfer of Care (Signed)
Immediate Anesthesia Transfer of Care Note  Patient: [place]  Procedure(s) Performed: OBLIQUE LUMBAR INTERBODY FUSION WITH SUPPLEMENTAL PEDICLE SCREW FIXATION LUMBAR FOUR TO FIVE (Spine Lumbar) LATERAL EXPOSURE FOR OBLIQUE SPINE SURGERY (Abdomen)  Patient Location: PACU  Anesthesia Type:General  Level of Consciousness: oriented, drowsy and patient cooperative  Airway & Oxygen Therapy: Patient Spontanous Breathing and Patient connected to face mask oxygen  Post-op Assessment: Report given to RN and Post -op Vital signs reviewed and stable  Post vital signs: Reviewed  Last Vitals:  Vitals Value Taken Time  BP 115/58 10/29/21 1200  Temp    Pulse 66 10/29/21 1202  Resp 14 10/29/21 1202  SpO2 100 % 10/29/21 1202  Vitals shown include unvalidated device data.  Last Pain:  Vitals:   10/29/21 0615  TempSrc:   PainSc: 4          Complications: No notable events documented.

## 2021-10-30 ENCOUNTER — Encounter (HOSPITAL_COMMUNITY): Payer: Self-pay | Admitting: Orthopedic Surgery

## 2021-10-30 MED ORDER — SENNOSIDES-DOCUSATE SODIUM 8.6-50 MG PO TABS
1.0000 | ORAL_TABLET | Freq: Two times a day (BID) | ORAL | Status: DC
  Administered 2021-10-30 – 2021-10-31 (×2): 1 via ORAL
  Filled 2021-10-30: qty 1

## 2021-10-30 MED ORDER — BISACODYL 5 MG PO TBEC
5.0000 mg | DELAYED_RELEASE_TABLET | Freq: Every day | ORAL | Status: DC | PRN
  Administered 2021-10-30: 5 mg via ORAL
  Filled 2021-10-30: qty 1

## 2021-10-30 NOTE — Progress Notes (Addendum)
  Progress Note    10/30/2021 6:52 AM 1 Day Post-Op  Subjective:  c/o soreness  afebrile  Vitals:   10/29/21 2258 10/30/21 0404  BP: 129/63 136/68  Pulse: 83 79  Resp: 18 18  Temp: 98.1 F (36.7 C) 98.3 F (36.8 C)  SpO2: 94% 98%    Physical Exam: General:  no distress Lungs:  non labored Incisions:  honey comb dressing in place and clean Extremities:  palpable DP pulses bilaterally Abdomen:  soft  CBC    Component Value Date/Time   WBC 5.4 10/19/2021 1530   RBC 4.32 10/19/2021 1530   HGB 13.8 10/19/2021 1530   HCT 42.0 10/19/2021 1530   PLT 238 10/19/2021 1530   MCV 97.2 10/19/2021 1530   MCH 31.9 10/19/2021 1530   MCHC 32.9 10/19/2021 1530   RDW 13.6 10/19/2021 1530   LYMPHSABS 0.8 09/20/2021 1612   MONOABS 0.4 09/20/2021 1612   EOSABS 0.1 09/20/2021 1612   BASOSABS 0.0 09/20/2021 1612    BMET    Component Value Date/Time   NA 138 10/19/2021 1530   K 4.6 10/19/2021 1530   CL 105 10/19/2021 1530   CO2 25 10/19/2021 1530   GLUCOSE 141 (H) 10/19/2021 1530   BUN 23 10/19/2021 1530   CREATININE 1.30 (H) 10/19/2021 1530   CREATININE 1.28 (H) 07/23/2019 1625   CALCIUM 9.7 10/19/2021 1530   CALCIUM 9.7 02/05/2019 0739   GFRNONAA 43 (L) 10/19/2021 1530   GFRNONAA 43 (L) 04/27/2019 1604   GFRAA 50 (L) 04/27/2019 1604    INR    Component Value Date/Time   INR 1.0 11/05/2013 1412     Intake/Output Summary (Last 24 hours) at 10/30/2021 1219 Last data filed at 10/29/2021 1854 Gross per 24 hour  Intake 2190 ml  Output 1160 ml  Net 1030 ml     Assessment/Plan:  71 y.o. female is s/p:  Spine exposure at the L4-L5 disc space via oblique retroperitoneal approach for L4-L5 OLIF  1 Day Post-Op   -pt doing well with palpable DP pulses bilaterally -doing well from vascular standpoint -follow up with [facility] as needed.     Leontine Locket, PA-C [facility] [phone] 10/30/2021 6:52 AM  I have seen and evaluated the patient. I  agree with the PA note as documented above.  Postop day 1 status post abdominal exposure for L4-L5 OLIF.  Looks good this morning.  Appropriate postop incisional tenderness.  No nausea or vomiting.  Palpable pedal pulse in the left foot.  Marty Heck, MD [facility] Office: [phone]

## 2021-10-30 NOTE — Evaluation (Signed)
Physical Therapy Evaluation Patient Details Name: Destiny Roth MRN: [id number] DOB: 1950-09-18 Today's Date: 10/30/2021  History of Present Illness  Pt is a 71 yr old female who presented due to lower back pain and has failed conservative measures. Pt s/p on 10/2 L4-5 OLIF.   PMH:  hypertension, Parkinson's disease, sarcoidosis and chronic lower back pain  Clinical Impression  PTA pt living with husband in multilevel home, bed and bath up 2 steps, with 3 steps to enter. Pt reports limited community ambulation without AD. Pt is currently limited in safe mobility by surgical site pain, longstanding neuropathy in feet effecting balance, decreased strength and endurance. Pt has poor precaution carryover and requires modA for bed mobility, and transfers and min A for ambulation with RW. PT recommending HHPT at discharge to improve safe mobility. PT will continue to follow acutely.       Recommendations for follow up therapy are one component of a multi-disciplinary discharge planning process, led by the attending physician.  Recommendations may be updated based on patient status, additional functional criteria and insurance authorization.  Follow Up Recommendations Home health PT      Assistance Recommended at Discharge Frequent or constant Supervision/Assistance  Patient can return home with the following  A little help with walking and/or transfers;A little help with bathing/dressing/bathroom;Assistance with cooking/housework;Assist for transportation;Help with stairs or ramp for entrance;Direct supervision/assist for medications management    Equipment Recommendations BSC/3in1 (Youth walker)     Functional Status Assessment Patient has had a recent decline in their functional status and demonstrates the ability to make significant improvements in function in a reasonable and predictable amount of time.     Precautions / Restrictions Precautions Precautions: Back Precaution Booklet  Issued: Yes (comment) Precaution Comments: difficulty with carryover of precautions Required Braces or Orthoses: Spinal Brace (brace not in room at time of Evaluation, orders in chart indicate no brace, as PT comes to door RN reports pt should have brace but husband has not brought to room yet) Spinal Brace: Lumbar corset Restrictions Weight Bearing Restrictions: No      Mobility  Bed Mobility Overal bed mobility: Needs Assistance Bed Mobility: Rolling, Sidelying to Sit Rolling: Mod assist Sidelying to sit: Mod assist       General bed mobility comments: with maximal multimodal cuing pt could not achieve sidelying without modA, pt reports she has no strength but does not provide any assist and requires max A to come to seated EoB,    Transfers Overall transfer level: Needs assistance Equipment used: Rolling walker (2 wheels) (Youth) Transfers: Sit to/from Stand Sit to Stand: Mod assist           General transfer comment: increased verbal cue for forward lean to place CoG over BoS for power up and requires modA for stedying, vc for hand placement for power up    Ambulation/Gait Ambulation/Gait assistance: Min assist Gait Distance (Feet): 100 Feet Assistive device: Rolling walker (2 wheels) Gait Pattern/deviations: Step-through pattern, Decreased step length - right, Decreased step length - left, Drifts right/left Gait velocity: slowed Gait velocity interpretation: <1.31 ft/sec, indicative of household ambulator   General Gait Details: minA for steadying, constant cuing for avoiding obstacles in hallway, pt with constant complaints of RW vering to L, educated that RW veers to L because she is looking to the right and she is turning in that direction,        Balance Overall balance assessment: Needs assistance Sitting-balance support: Feet unsupported, Bilateral upper extremity supported,  Single extremity supported Sitting balance-Leahy Scale: Fair   Postural control:  Posterior lean Standing balance support: Single extremity supported, Bilateral upper extremity supported, During functional activity, Reliant on assistive device for balance Standing balance-Leahy Scale: Poor Standing balance comment: required outside assist to correct posterior lean on coming to standing once in standing requires min A to maintain balance                             Pertinent Vitals/Pain Pain Assessment Pain Assessment: Faces Faces Pain Scale: Hurts little more Pain Location: back with movement Pain Descriptors / Indicators: Grimacing, Guarding, Sore, Aching Pain Intervention(s): Limited activity within patient's tolerance, Monitored during session, Repositioned    Home Living Family/patient expects to be discharged to:: Private residence Living Arrangements: Spouse/significant other Available Help at Discharge: Family;Available 24 hours/day Type of Home: House Home Access: Stairs to enter Entrance Stairs-Rails: Right Entrance Stairs-Number of Steps: 3   Home Layout: Two level;Able to live on main level with bedroom/bathroom Home Equipment: [place] (2 wheels) (per decription standard size walker pt need youth walker)      Prior Function Prior Level of Function : Needs assist             Mobility Comments: reports decreased distance ambulation, not using AD prior to sx ADLs Comments: husband assist with iADLs     Hand Dominance   Dominant Hand: Right    Extremity/Trunk Assessment   Upper Extremity Assessment Upper Extremity Assessment: Defer to OT evaluation    Lower Extremity Assessment Lower Extremity Assessment: RLE deficits/detail;LLE deficits/detail RLE Deficits / Details: ROM WFL, strength grossly 3+/5 RLE: Unable to fully assess due to pain RLE Sensation: decreased light touch;history of peripheral neuropathy;decreased proprioception RLE Coordination: decreased fine motor LLE Deficits / Details: ROM WFL, strength grossly  3+/5 LLE: Unable to fully assess due to pain LLE Sensation: history of peripheral neuropathy;decreased proprioception LLE Coordination: decreased fine motor    Cervical / Trunk Assessment Cervical / Trunk Assessment: Back Surgery  Communication   Communication: No difficulties  Cognition Arousal/Alertness: Awake/alert Behavior During Therapy: Impulsive Overall Cognitive Status: Impaired/Different from baseline Area of Impairment: Attention, Memory, Following commands, Safety/judgement, Awareness, Problem solving                   Current Attention Level: Selective Memory: Decreased recall of precautions Following Commands: Follows multi-step commands inconsistently, Follows one step commands with increased time Safety/Judgement: Decreased awareness of safety Awareness: Emergent Problem Solving: Slow processing, Requires verbal cues, Requires tactile cues, Difficulty sequencing General Comments: requires increased commands for task completion, difficulty with carryover of precautions, talks over therapist when providing commands        General Comments General comments (skin integrity, edema, etc.): VSS on RA    Exercises Other Exercises Other Exercises: IS x 10 max inhalation 1748m   Assessment/Plan    PT Assessment Patient needs continued PT services  PT Problem List Decreased strength;Decreased activity tolerance;Decreased balance;Decreased mobility;Decreased coordination;Decreased cognition;Decreased knowledge of use of DME;Decreased safety awareness;Decreased knowledge of precautions;Impaired sensation;Pain;Decreased skin integrity       PT Treatment Interventions DME instruction;Gait training;Stair training;Functional mobility training;Therapeutic activities;Therapeutic exercise;Balance training;Cognitive remediation;Patient/family education    PT Goals (Current goals can be found in the Care Plan section)  Acute Rehab PT Goals Patient Stated Goal: have less  pain PT Goal Formulation: With patient Time For Goal Achievement: 11/13/21 Potential to Achieve Goals: Fair    Frequency Min 5X/week  AM-PAC PT "6 Clicks" Mobility  Outcome Measure Help needed turning from your back to your side while in a flat bed without using bedrails?: A Lot Help needed moving from lying on your back to sitting on the side of a flat bed without using bedrails?: A Lot Help needed moving to and from a bed to a chair (including a wheelchair)?: A Lot Help needed standing up from a chair using your arms (e.g., wheelchair or bedside chair)?: A Lot Help needed to walk in hospital room?: A Little Help needed climbing 3-5 steps with a railing? : A Lot 6 Click Score: 13    End of Session Equipment Utilized During Treatment: Gait belt Activity Tolerance: Patient limited by pain Patient left: in chair;with call bell/phone within reach;Other (comment) (OT in room) Nurse Communication: Mobility status PT Visit Diagnosis: Unsteadiness on feet (R26.81);Other abnormalities of gait and mobility (R26.89);Muscle weakness (generalized) (M62.81);Difficulty in walking, not elsewhere classified (R26.2);Pain;Other symptoms and signs involving the nervous system (Z76.734)    Time: 1937-9024 PT Time Calculation (min) (ACUTE ONLY): 22 min   Charges:   PT Evaluation $PT Eval Moderate Complexity: 1 Mod          Nasiah Polinsky B. Migdalia Dk PT, DPT Acute Rehabilitation Services Please use secure chat or  Call Office [phone]   [place] 10/30/2021, 9:06 AM

## 2021-10-30 NOTE — TOC Initial Note (Signed)
Transition of Care [facility]) - Initial/Assessment Note    Patient Details  Name: Destiny Roth MRN: [id number] Date of Birth: 1950/10/15  [facility] CM/SW Contact:    Pollie Friar, RN Phone Number: 10/30/2021, 11:24 AM  Clinical Narrative:                 Pt is from home with her spouse. She has recommendations for HHPT. Pt provided choice and [facility] decided on. Information on the AVS. Will need orders for [facility] services per MD.  Any needed DME will be obtained per bedside RN.  Pt has transportation home when d/ced.   Expected Discharge Plan: [place] Barriers to Discharge: Continued Medical Work up   Patient Goals and CMS Choice   CMS Medicare.gov Compare Post Acute Care list provided to:: Patient Choice offered to / list presented to : Patient  Expected Discharge Plan and Services Expected Discharge Plan: [place]   Discharge Planning Services: CM Consult Post Acute Care Choice: [place] arrangements for the past 2 months: Single Family Home                           HH Arranged: PT, OT HH Agency: [facility] Date [place]: 10/30/21   Representative spoke with at [place] [place]: Claiborne Billings  Prior Living Arrangements/Services Living arrangements for the past 2 months: [place] Lives with:: Spouse Patient language and need for interpreter reviewed:: Yes Do you feel safe going back to the place where you live?: Yes            Criminal Activity/Legal Involvement Pertinent to Current Situation/Hospitalization: No - Comment as needed  Activities of Daily Living      Permission Sought/Granted                  Emotional Assessment Appearance:: Appears stated age Attitude/Demeanor/Rapport: Engaged Affect (typically observed): Accepting Orientation: : Oriented to Self, Oriented to Place, Oriented to  Time, Oriented to Situation   Psych Involvement: No  (comment)  Admission diagnosis:  S/P lumbar fusion [Z98.1] Patient Active Problem List   Diagnosis Date Noted   S/P lumbar fusion 10/29/2021   Small fiber neuropathy 11/20/2020   Lumbar radiculopathy 06/28/2020   Chronic neck pain 06/20/2020   Degenerative spondylolisthesis 06/20/2020   High risk medication use 04/27/2020   Panuveitis of both eyes 04/27/2020   Parkinson's disease 09/19/2019   Optic nerve edema 07/06/2019   Peripheral focal chorioretinal inflammation of both eyes 07/06/2019   Retinal edema 07/06/2019   Therapeutic drug monitoring 05/26/2019   Visual disturbance 04/27/2019   Sarcoidosis 02/26/2019   Hypercalcemia 02/04/2019   Generalized weakness 02/04/2019   Left-sided vestibular weakness 07/24/2017   Moderate episode of recurrent major depressive disorder ([place]) 05/28/2017   RBD (REM behavioral disorder) 10/16/2016   Chronic depression 10/16/2016   Fatigue 10/16/2016   Vertigo 03/12/2016   Pulmonary air trapping 02/24/2015   DOE (dyspnea on exertion) 11/18/2013   Hot flashes 11/18/2013   Memory impairment 08/04/2013   Orthostasis 02/26/2013   Tachycardia 02/24/2013   Coronary artery calcification seen on CAT scan 01/01/2013   Tendonitis, calcific, shoulder 01/01/2013   OSA (obstructive sleep apnea) 01/01/2013   PD (Parkinson's disease) ([place]) 11/26/2011   Hematochezia 11/09/2011   Colon polyps 11/09/2011   IBS (irritable bowel syndrome) 11/09/2011   Anosmia 11/06/2010   Impaired glucose tolerance 11/04/2010   Hearing  loss 05/07/2010   Preventative health care 05/07/2010   BUNION, RIGHT FOOT 09/18/2009   GERD 05/24/2008   OSTEOPOROSIS 05/24/2008   Hyperlipidemia 09/23/2006   Anxiety state 09/23/2006   Depression 09/23/2006   CARPAL TUNNEL SYNDROME, BILATERAL 09/23/2006   Essential hypertension 09/23/2006   DIVERTICULOSIS, COLON 09/23/2006   Carpal tunnel syndrome, bilateral 09/23/2006   PCP:  Leeroy Cha, MD Pharmacy:   [id number]: [phone]: [phone] [facility] [id number]: [phone]: [phone]    Social Determinants of Health (SDOH) Interventions    Readmission Risk Interventions     No data to display

## 2021-10-30 NOTE — Evaluation (Signed)
Occupational Therapy Evaluation Patient Details Name: Destiny Roth MRN: [id number] DOB: 02/12/1950 Today's Date: 10/30/2021   History of Present Illness Pt is a 71 yr old female who presented due to lower back pain and has failed conservative measures. Pt s/p on 10/2 L4-5 OLIF.   PMH:  hypertension, Parkinson's disease, sarcoidosis and chronic lower back pain   Clinical Impression   Pt reported at PLOF limited activity within the home with husband support as needed. Pt at this time was limited with activity due to not having brace and reported pain increased from having Physical Therapy session. Pt at this time required min guard to min assist sit to stand, set up for UE dressing and min guard for LB dressing to follow precautions. Pt currently with functional limitations due to the deficits listed below (see OT Problem List).  Pt will benefit from skilled OT to increase their safety and independence with ADL and functional mobility for ADL to facilitate discharge to venue listed below.        Recommendations for follow up therapy are one component of a multi-disciplinary discharge planning process, led by the attending physician.  Recommendations may be updated based on patient status, additional functional criteria and insurance authorization.   Follow Up Recommendations  No OT follow up    Assistance Recommended at Discharge Frequent or constant Supervision/Assistance  Patient can return home with the following A little help with walking and/or transfers;A little help with bathing/dressing/bathroom    Functional Status Assessment  Patient has had a recent decline in their functional status and demonstrates the ability to make significant improvements in function in a reasonable and predictable amount of time.  Equipment Recommendations  None recommended by OT    Recommendations for Other Services       Precautions / Restrictions Precautions Precautions: Back Precaution  Booklet Issued: Yes (comment) Precaution Comments: difficulty with carryover of precautions Required Braces or Orthoses: Spinal Brace Spinal Brace: Lumbar corset Restrictions Weight Bearing Restrictions: No      Mobility Bed Mobility Overal bed mobility:  (Pt presented in bed and reported they were in pain to complete further bed mobility)                  Transfers Overall transfer level: Needs assistance Equipment used: Rolling walker (2 wheels) Transfers: Sit to/from Stand Sit to Stand: Min assist, Min guard           General transfer comment: Pt needs cues on positioning prior to stadning and stabilizing walker      Balance Overall balance assessment: Needs assistance Sitting-balance support: Feet unsupported, Bilateral upper extremity supported, Single extremity supported Sitting balance-Leahy Scale: Fair   Postural control: Posterior lean Standing balance support: Single extremity supported, Bilateral upper extremity supported, During functional activity, Reliant on assistive device for balance Standing balance-Leahy Scale: Poor                             ADL either performed or assessed with clinical judgement   ADL Overall ADL's : Needs assistance/impaired Eating/Feeding: Independent;Sitting   Grooming: Wash/dry hands;Wash/dry face;Min guard;Standing   Upper Body Bathing: Supervision/ safety;Sitting   Lower Body Bathing: Min guard;Sit to/from stand   Upper Body Dressing : Supervision/safety;Sitting   Lower Body Dressing: Min guard;Sit to/from stand   Toilet Transfer: Agricultural engineer (2 wheels)   Toileting- Water quality scientist and Hygiene: Min guard;Cueing for safety;Cueing for sequencing;Sit to/from stand  Functional mobility during ADLs: Supervision/safety;Rolling walker (2 wheels)       Vision         Perception     Praxis      Pertinent Vitals/Pain Pain Assessment Pain Assessment: 0-10 Pain Score: 9   Pain Location: back with movement Pain Descriptors / Indicators: Grimacing, Guarding, Sore, Aching Pain Intervention(s): Limited activity within patient's tolerance, Monitored during session, Premedicated before session, Repositioned     Hand Dominance Right   Extremity/Trunk Assessment Upper Extremity Assessment Upper Extremity Assessment: Generalized weakness (decrease in FM due to Parkinson)   Lower Extremity Assessment Lower Extremity Assessment: Defer to PT evaluation RLE Deficits / Details: ROM WFL, strength grossly 3+/5 RLE: Unable to fully assess due to pain RLE Sensation: decreased light touch;history of peripheral neuropathy;decreased proprioception RLE Coordination: decreased fine motor LLE Deficits / Details: ROM WFL, strength grossly 3+/5 LLE: Unable to fully assess due to pain LLE Sensation: history of peripheral neuropathy;decreased proprioception LLE Coordination: decreased fine motor   Cervical / Trunk Assessment Cervical / Trunk Assessment: Back Surgery   Communication Communication Communication: No difficulties   Cognition Arousal/Alertness: Awake/alert Behavior During Therapy: Impulsive Overall Cognitive Status: Impaired/Different from baseline Area of Impairment: Attention, Memory, Following commands, Safety/judgement, Awareness, Problem solving                   Current Attention Level: Selective Memory: Decreased recall of precautions Following Commands: Follows multi-step commands inconsistently, Follows one step commands with increased time Safety/Judgement: Decreased awareness of safety Awareness: Emergent Problem Solving: Slow processing, Requires verbal cues, Requires tactile cues, Difficulty sequencing General Comments: requires increased commands for task completion, difficulty with carryover of precautions, talks over therapist when providing commands     General Comments  VSS on RA    Exercises     Shoulder Instructions       Home Living Family/patient expects to be discharged to:: Private residence Living Arrangements: Spouse/significant other Available Help at Discharge: Family;Available 24 hours/day Type of Home: House Home Access: Stairs to enter [employer] of Steps: 3 Entrance Stairs-Rails: Right Home Layout: Two level;Able to live on main level with bedroom/bathroom     Bathroom Shower/Tub: Occupational psychologist: Standard Bathroom Accessibility: Yes   Home Equipment: Conservation officer, nature (2 wheels)   Additional Comments: needs increase in time and decrease in recall      Prior Functioning/Environment Prior Level of Function : Needs assist             Mobility Comments: reports decreased distance ambulation, not using AD prior to sx ADLs Comments: husband assist with iADLs        OT Problem List: Decreased strength;Decreased activity tolerance;Impaired balance (sitting and/or standing);Decreased safety awareness;Decreased knowledge of use of DME or AE;Cardiopulmonary status limiting activity;Pain      OT Treatment/Interventions: Self-care/ADL training;DME and/or AE instruction;Therapeutic activities;Patient/family education;Balance training    OT Goals(Current goals can be found in the care plan section) Acute Rehab OT Goals Patient Stated Goal: to feel better OT Goal Formulation: With patient Time For Goal Achievement: 11/13/21 Potential to Achieve Goals: Good ADL Goals Pt Will Perform Lower Body Bathing: with modified independence;sit to/from stand Pt Will Perform Lower Body Dressing: with modified independence;sit to/from stand Pt Will Transfer to Toilet: with modified independence;ambulating Additional ADL Goal #1: Pt will be able to complete bed mobility with min guard Additional ADL Goal #2: Pt will be able to don/doff brace with set up  OT Frequency: Min 2X/week  Co-evaluation              AM-PAC OT "6 Clicks" Daily Activity     Outcome  Measure Help from another person eating meals?: None Help from another person taking care of personal grooming?: A Little Help from another person toileting, which includes using toliet, bedpan, or urinal?: A Little Help from another person bathing (including washing, rinsing, drying)?: A Little Help from another person to put on and taking off regular upper body clothing?: None Help from another person to put on and taking off regular lower body clothing?: A Little 6 Click Score: 20   End of Session Equipment Utilized During Treatment: Gait belt;Rolling walker (2 wheels) Nurse Communication: Mobility status  Activity Tolerance: Patient tolerated treatment well Patient left: in chair;with call bell/phone within reach  OT Visit Diagnosis: Unsteadiness on feet (R26.81);Other abnormalities of gait and mobility (R26.89);Repeated falls (R29.6);Muscle weakness (generalized) (M62.81);Pain Pain - Right/Left:  (back)                Time: 0240-9735 OT Time Calculation (min): 19 min Charges:  OT General Charges $OT Visit: 1 Visit OT Evaluation $OT Eval Low Complexity: [place] OTR/L  Acute Rehab Services  [phone] office number [phone] pager number   Joeseph Amor 10/30/2021, 11:27 AM

## 2021-10-30 NOTE — Progress Notes (Signed)
    Subjective: Procedure(s) (LRB): OBLIQUE LUMBAR INTERBODY FUSION WITH SUPPLEMENTAL PEDICLE SCREW FIXATION LUMBAR FOUR TO FIVE (N/A) LATERAL EXPOSURE FOR OBLIQUE SPINE SURGERY (N/A) 1 Day Post-Op  Patient reports pain as 2 on 0-10 scale.  Reports decreased leg pain reports incisional back pain   Positive void Negative bowel movement Negative flatus Negative chest pain or shortness of breath  Objective: Vital signs in last 24 hours: Temp:  [97.7 F (36.5 C)-98.5 F (36.9 C)] 98.5 F (36.9 C) (10/03 0736) Pulse Rate:  [65-87] 87 (10/03 0736) Resp:  [11-18] 16 (10/03 0736) BP: (109-153)/(58-91) 132/61 (10/03 0736) SpO2:  [93 %-100 %] 98 % (10/03 0736)  Intake/Output from previous day: 10/02 0701 - 10/03 0700 In: 2190 [P.O.:240; I.V.:1750; IV Piggyback:200] Out: 1160 [Urine:1100; Blood:60]  Labs: No results for input(s): "WBC", "RBC", "HCT", "PLT" in the last 72 hours. No results for input(s): "NA", "K", "CL", "CO2", "BUN", "CREATININE", "GLUCOSE", "CALCIUM" in the last 72 hours. No results for input(s): "LABPT", "INR" in the last 72 hours.  Physical Exam: Neurologically intact ABD soft Intact pulses distally Dorsiflexion/Plantar flexion intact Incision: dressing C/D/I and no drainage Compartment soft Body mass index is 26.13 kg/m.   Assessment/Plan: Patient stable  xrays n/a Continue mobilization with physical therapy Continue care  Advance diet Up with therapy Plan for discharge tomorrow  Melina Schools, MD [facility] [phone]

## 2021-10-31 ENCOUNTER — Encounter (HOSPITAL_COMMUNITY): Payer: Self-pay | Admitting: Emergency Medicine

## 2021-10-31 ENCOUNTER — Other Ambulatory Visit: Payer: Self-pay

## 2021-10-31 ENCOUNTER — Emergency Department (HOSPITAL_COMMUNITY)
Admission: EM | Admit: 2021-10-31 | Discharge: 2021-11-01 | Discharge status: Against Medical Advice | Payer: Medicare Other | Attending: Physician Assistant | Admitting: Physician Assistant

## 2021-10-31 ENCOUNTER — Emergency Department (HOSPITAL_COMMUNITY): Payer: Medicare Other

## 2021-10-31 DIAGNOSIS — G20C Parkinsonism, unspecified: Secondary | ICD-10-CM | POA: Insufficient documentation

## 2021-10-31 DIAGNOSIS — R4182 Altered mental status, unspecified: Secondary | ICD-10-CM | POA: Diagnosis not present

## 2021-10-31 DIAGNOSIS — R059 Cough, unspecified: Secondary | ICD-10-CM | POA: Insufficient documentation

## 2021-10-31 DIAGNOSIS — R531 Weakness: Secondary | ICD-10-CM | POA: Insufficient documentation

## 2021-10-31 DIAGNOSIS — Z5321 Procedure and treatment not carried out due to patient leaving prior to being seen by health care provider: Secondary | ICD-10-CM | POA: Diagnosis not present

## 2021-10-31 DIAGNOSIS — G4751 Confusional arousals: Secondary | ICD-10-CM | POA: Insufficient documentation

## 2021-10-31 LAB — COMPREHENSIVE METABOLIC PANEL
ALT: 11 U/L (ref 0–44)
AST: 62 U/L — ABNORMAL HIGH (ref 15–41)
Albumin: 3.3 g/dL — ABNORMAL LOW (ref 3.5–5.0)
Alkaline Phosphatase: 54 U/L (ref 38–126)
Anion gap: 11 (ref 5–15)
BUN: 12 mg/dL (ref 8–23)
CO2: 23 mmol/L (ref 22–32)
Calcium: 9.1 mg/dL (ref 8.9–10.3)
Chloride: 102 mmol/L (ref 98–111)
Creatinine, Ser: 1.01 mg/dL — ABNORMAL HIGH (ref 0.44–1.00)
GFR, Estimated: 59 mL/min — ABNORMAL LOW (ref >60)
Glucose, Bld: 129 mg/dL — ABNORMAL HIGH (ref 70–99)
Potassium: 3.7 mmol/L (ref 3.5–5.1)
Sodium: 136 mmol/L (ref 135–145)
Total Bilirubin: 0.8 mg/dL (ref 0.3–1.2)
Total Protein: 6.7 g/dL (ref 6.5–8.1)

## 2021-10-31 LAB — CBC WITH DIFFERENTIAL/PLATELET
Abs Immature Granulocytes: 0.05 10*3/uL (ref 0.00–0.07)
Basophils Absolute: 0 10*3/uL (ref 0.0–0.1)
Basophils Relative: 0 %
Eosinophils Absolute: 0 10*3/uL (ref 0.0–0.5)
Eosinophils Relative: 1 %
HCT: 35.2 % — ABNORMAL LOW (ref 36.0–46.0)
Hemoglobin: 12 g/dL (ref 12.0–15.0)
Immature Granulocytes: 1 %
Lymphocytes Relative: 9 %
Lymphs Abs: 0.7 10*3/uL (ref 0.7–4.0)
MCH: 32.4 pg (ref 26.0–34.0)
MCHC: 34.1 g/dL (ref 30.0–36.0)
MCV: 95.1 fL (ref 80.0–100.0)
Monocytes Absolute: 0.5 10*3/uL (ref 0.1–1.0)
Monocytes Relative: 7 %
Neutro Abs: 6.4 10*3/uL (ref 1.7–7.7)
Neutrophils Relative %: 82 %
Platelets: 221 10*3/uL (ref 150–400)
RBC: 3.7 MIL/uL — ABNORMAL LOW (ref 3.87–5.11)
RDW: 13.9 % (ref 11.5–15.5)
WBC: 7.8 10*3/uL (ref 4.0–10.5)
nRBC: 0 % (ref 0.0–0.2)

## 2021-10-31 MED ORDER — SORBITOL 70 % SOLN
30.0000 mL | Freq: Every day | Status: DC | PRN
  Administered 2021-10-31: 30 mL via ORAL
  Filled 2021-10-31: qty 30

## 2021-10-31 NOTE — Progress Notes (Signed)
Pt. discharged home accompanied by husband. Prescriptions and discharge instructions given with verbalization of understanding. Incision site on back with no s/s of infection - no swelling, redness, bleeding, and/or drainage noted. Opportunity given to ask questions but no question asked. Pt. transported out of this unit in wheelchair by the volunteer  and Staff.

## 2021-10-31 NOTE — ED Provider Triage Note (Signed)
Emergency Medicine Provider Triage Evaluation Note  Destiny Roth , a 71 y.o. female  was evaluated in triage.  Pt complains of confusion.  Recently discharged from the hospital this afternoon after spinal fusion with Dr. Rolena Infante [facility].  Family states she has been confused since discharge.  No recent falls or injuries.  She has been ambulatory at baseline.  No numbness or weakness.  Has Parkinson's at baseline.  She denies any drainage or redness from incision.  No urinary symptoms.  Does have a cough.  Review of Systems  Positive: AMS, cough, gen weakness Negative: Fever, numbness  Physical Exam  There were no vitals taken for this visit. Gen:   Awake, no distress   Resp:  Normal effort  MSK:   Moves extremities without difficulty Neuro:  CN 2-12 grossly intact, equal hand grip, intact sensation, alert to person, place, takes awhile to get to time Other:    Medical Decision Making  Medically screening exam initiated at 9:17 PM.  Appropriate orders placed.  Destiny Roth was informed that the remainder of the evaluation will be completed by another provider, this initial triage assessment does not replace that evaluation, and the importance of remaining in the ED until their evaluation is complete.  Confusion  No focal deficits on exam, not a code stroke, no LVO criteria   Gladys Gutman A, PA-C 10/31/21 2120

## 2021-10-31 NOTE — ED Triage Notes (Signed)
Patient's spouse reported brief confusion this evening , alert and oriented at triage , speech clear with no facial asymmetry , no arm drift or headache .

## 2021-10-31 NOTE — Progress Notes (Signed)
Physical Therapy Treatment Patient Details Name: Destiny Roth MRN: [id number] DOB: Aug 01, 1950 Today's Date: 10/31/2021   History of Present Illness Pt is a 71 yr old female who presented due to lower back pain and has failed conservative measures. Pt s/p on 10/2 L4-5 OLIF.   PMH:  hypertension, Parkinson's disease, sarcoidosis and chronic lower back pain    PT Comments    Pt progressing well with post-op mobility. She was able to demonstrate transfers and ambulation with gross min assist for balance support and safety, with RW for support. Pt was educated on precautions, brace application/wearing schedule, appropriate activity progression, and car transfer. Will continue to follow.      Recommendations for follow up therapy are one component of a multi-disciplinary discharge planning process, led by the attending physician.  Recommendations may be updated based on patient status, additional functional criteria and insurance authorization.  Follow Up Recommendations  Home health PT     Assistance Recommended at Discharge Frequent or constant Supervision/Assistance  Patient can return home with the following A little help with walking and/or transfers;A little help with bathing/dressing/bathroom;Assistance with cooking/housework;Assist for transportation;Help with stairs or ramp for entrance;Direct supervision/assist for medications management   Equipment Recommendations  BSC/3in1 (Youth walker)    Recommendations for Other Services       Precautions / Restrictions Precautions Precautions: Back Precaution Booklet Issued: Yes (comment) Precaution Comments: Reviewed handout and pt was cued for precautions during functional mobility. Required Braces or Orthoses: Spinal Brace Spinal Brace: Lumbar corset;Applied in sitting position Restrictions Weight Bearing Restrictions: No     Mobility  Bed Mobility Overal bed mobility: Needs Assistance Bed Mobility: Sit to Sidelying,  Rolling Rolling: Min guard       Sit to sidelying: Mod assist General bed mobility comments: HOB flat and rails lowered to simulate home environment. VC's to scoot back in the bed prior to initiation of sit>sidelying. Pt required mod assist to elevate LE's up into bed, and to reposition.    Transfers Overall transfer level: Needs assistance Equipment used: Rolling walker (2 wheels) Transfers: Sit to/from Stand Sit to Stand: Min assist           General transfer comment: Initially pt required min assist to correct posterior lean and prevent posterior LOB. VC's for hand placement on seated surface for safety. Several trials of practice to get hand placement correct without cues.    Ambulation/Gait Ambulation/Gait assistance: Min assist Gait Distance (Feet): 150 Feet Assistive device: Rolling walker (2 wheels) Gait Pattern/deviations: Step-through pattern, Decreased step length - right, Decreased step length - left, Drifts right/left, Trunk flexed Gait velocity: Decreased Gait velocity interpretation: 1.31 - 2.62 ft/sec, indicative of limited community ambulator   General Gait Details: Hands on guarding throughout, and assist for walker management as pt drifting L into the wall.   Stairs Stairs: Yes Stairs assistance: Min assist Stair Management: Two rails, Step to pattern, Forwards Number of Stairs: 3 General stair comments: VC's for sequencing and general safety. Assist for balance support and controlled lower with each descending step.   Wheelchair Mobility    Modified Rankin (Stroke Patients Only)       Balance Overall balance assessment: Needs assistance Sitting-balance support: Feet unsupported, Bilateral upper extremity supported, Single extremity supported Sitting balance-Leahy Scale: Fair   Postural control: Posterior lean Standing balance support: Bilateral upper extremity supported, During functional activity, Reliant on assistive device for  balance Standing balance-Leahy Scale: Poor  Cognition Arousal/Alertness: Awake/alert Behavior During Therapy: Impulsive Overall Cognitive Status: Impaired/Different from baseline Area of Impairment: Attention, Memory, Following commands, Safety/judgement, Awareness, Problem solving                   Current Attention Level: Selective Memory: Decreased recall of precautions Following Commands: Follows multi-step commands inconsistently, Follows one step commands with increased time Safety/Judgement: Decreased awareness of safety Awareness: Emergent Problem Solving: Slow processing, Requires verbal cues, Requires tactile cues, Difficulty sequencing          Exercises      General Comments        Pertinent Vitals/Pain Pain Assessment Pain Assessment: Faces Faces Pain Scale: Hurts a little bit Pain Location: back with movement Pain Descriptors / Indicators: Grimacing, Guarding, Sore, Aching Pain Intervention(s): Limited activity within patient's tolerance, Monitored during session, Repositioned    Home Living Family/patient expects to be discharged to:: Private residence Living Arrangements: Spouse/significant other Available Help at Discharge: Family;Available 24 hours/day Type of Home: House Home Access: Stairs to enter Entrance Stairs-Rails: Right Entrance Stairs-Number of Steps: 3   Home Layout: Two level;Able to live on main level with bedroom/bathroom Home Equipment: Rolling Walker (2 wheels) Additional Comments: needs increase in time and decrease in recall    Prior Function            PT Goals (current goals can now be found in the care plan section) Acute Rehab PT Goals Patient Stated Goal: have less pain PT Goal Formulation: With patient Time For Goal Achievement: 11/13/21 Potential to Achieve Goals: Fair Progress towards PT goals: Progressing toward goals    Frequency    Min 5X/week      PT  Plan Current plan remains appropriate    Co-evaluation              AM-PAC PT "6 Clicks" Mobility   Outcome Measure  Help needed turning from your back to your side while in a flat bed without using bedrails?: A Lot Help needed moving from lying on your back to sitting on the side of a flat bed without using bedrails?: A Lot Help needed moving to and from a bed to a chair (including a wheelchair)?: A Little Help needed standing up from a chair using your arms (e.g., wheelchair or bedside chair)?: A Little Help needed to walk in hospital room?: A Little Help needed climbing 3-5 steps with a railing? : A Little 6 Click Score: 16    End of Session Equipment Utilized During Treatment: Gait belt Activity Tolerance: Patient limited by pain Patient left: in chair;with call bell/phone within reach Nurse Communication: Mobility status PT Visit Diagnosis: Unsteadiness on feet (R26.81);Other abnormalities of gait and mobility (R26.89);Muscle weakness (generalized) (M62.81);Difficulty in walking, not elsewhere classified (R26.2);Pain;Other symptoms and signs involving the nervous system (R29.898) Pain - part of body:  (back)     Time: 1601-0932 PT Time Calculation (min) (ACUTE ONLY): 30 min  Charges:  $Gait Training: 23-37 mins                     Rolinda Roan, PT, DPT Acute Rehabilitation Services Secure Chat Preferred Office: [phone]    Thelma Comp 10/31/2021, 10:13 AM

## 2021-10-31 NOTE — Progress Notes (Signed)
    Subjective: Procedure(s) (LRB): OBLIQUE LUMBAR INTERBODY FUSION WITH SUPPLEMENTAL PEDICLE SCREW FIXATION LUMBAR FOUR TO FIVE (N/A) LATERAL EXPOSURE FOR OBLIQUE SPINE SURGERY (N/A) 2 Days Post-Op  Patient reports pain as 2 on 0-10 scale.  Reports decreased leg pain reports incisional back pain   Positive void Negative bowel movement Negative flatus Negative chest pain or shortness of breath  Objective: Vital signs in last 24 hours: Temp:  [98.5 F (36.9 C)-99.8 F (37.7 C)] 99.8 F (37.7 C) (10/04 0350) Pulse Rate:  [83-92] 86 (10/04 0350) Resp:  [16-18] 18 (10/04 0350) BP: (109-140)/(47-64) 138/64 (10/04 0350) SpO2:  [92 %-99 %] 92 % (10/04 0350)  Intake/Output from previous day: 10/03 0701 - 10/04 0700 In: 360 [P.O.:360] Out: -   Labs: No results for input(s): "WBC", "RBC", "HCT", "PLT" in the last 72 hours. No results for input(s): "NA", "K", "CL", "CO2", "BUN", "CREATININE", "GLUCOSE", "CALCIUM" in the last 72 hours. No results for input(s): "LABPT", "INR" in the last 72 hours.  Physical Exam: Neurologically intact ABD soft Intact pulses distally Incision: dressing C/D/I and no drainage Compartment soft Body mass index is 26.13 kg/m.   Assessment/Plan: Patient stable  xrays n/a Continue mobilization with physical therapy Continue care  Up with therapy Await return of bowel function.  Plan on d/c to home after BM/flatus   Melina Schools, MD [facility] [phone]

## 2021-10-31 NOTE — ED Notes (Signed)
Pt states that they did not want to wait due to long wait times. Pt left with husband

## 2021-10-31 NOTE — Progress Notes (Signed)
Occupational Therapy Treatment Patient Details Name: Destiny Roth MRN: [id number] DOB: 11/25/1950 Today's Date: 10/31/2021   History of present illness Pt is a 71 yr old female who presented due to lower back pain and has failed conservative measures. Pt s/p on 10/2 L4-5 OLIF.   PMH:  hypertension, Parkinson's disease, sarcoidosis and chronic lower back pain   OT comments  Patient seated up in recliner upon entry and agreeable to OT session. Address back precautions with min verbal cues to recall. Patient performed self care with grooming standing at sink and UB/LB bathing seated/standing from toilet with min guard for toilet transfers. Patient required min assist for LB dressing with assistance to thread legs into clothing and patient able to pull up while standing. Patient progressed well with OT treatment and discharge recommendations continue to be appropriate.    Recommendations for follow up therapy are one component of a multi-disciplinary discharge planning process, led by the attending physician.  Recommendations may be updated based on patient status, additional functional criteria and insurance authorization.    Follow Up Recommendations  No OT follow up    Assistance Recommended at Discharge Frequent or constant Supervision/Assistance  Patient can return home with the following  A little help with walking and/or transfers;A little help with bathing/dressing/bathroom   Equipment Recommendations  None recommended by OT    Recommendations for Other Services      Precautions / Restrictions Precautions Precautions: Back Precaution Booklet Issued: Yes (comment) Precaution Comments: reviewed back precautions Required Braces or Orthoses: Spinal Brace Spinal Brace: Lumbar corset;Applied in sitting position Restrictions Weight Bearing Restrictions: No       Mobility Bed Mobility Overal bed mobility: Needs Assistance             General bed mobility comments:  seated in recliner upon entry    Transfers Overall transfer level: Needs assistance Equipment used: Rolling walker (2 wheels) Transfers: Sit to/from Stand Sit to Stand: Min assist           General transfer comment: min assist to power up with verbal cues for hand positioning     Balance Overall balance assessment: Needs assistance Sitting-balance support: Feet unsupported, Bilateral upper extremity supported, Single extremity supported Sitting balance-Leahy Scale: Fair     Standing balance support: Single extremity supported, Bilateral upper extremity supported, During functional activity Standing balance-Leahy Scale: Poor Standing balance comment: one extremity support while performing grooming standing at sink                           ADL either performed or assessed with clinical judgement   ADL Overall ADL's : Needs assistance/impaired     Grooming: Wash/dry hands;Wash/dry face;Oral care;Brushing hair;Min guard;Standing Grooming Details (indicate cue type and reason): at sink Upper Body Bathing: Supervision/ safety;Sitting   Lower Body Bathing: Min guard;Sit to/from stand Lower Body Bathing Details (indicate cue type and reason): bathed lower legs seated Upper Body Dressing : Supervision/safety;Sitting Upper Body Dressing Details (indicate cue type and reason): to donn shirt Lower Body Dressing: Minimal assistance;Sit to/from stand Lower Body Dressing Details (indicate cue type and reason): assistance to thread legs into pants Toilet Transfer: Min guard;Rolling walker (2 wheels);Regular Glass blower/designer Details (indicate cue type and reason): cues for safety Toileting- Clothing Manipulation and Hygiene: Min guard;Cueing for safety;Cueing for sequencing;Sit to/from stand              Extremity/Trunk Assessment     Lower Extremity Assessment  RLE Deficits / Details: ROM WFL, strength grossly 3+/5 RLE: Unable to fully assess due to pain RLE  Sensation: decreased light touch;history of peripheral neuropathy;decreased proprioception RLE Coordination: decreased fine motor LLE Deficits / Details: ROM WFL, strength grossly 3+/5 LLE: Unable to fully assess due to pain LLE Sensation: history of peripheral neuropathy;decreased proprioception LLE Coordination: decreased fine motor   Cervical / Trunk Assessment Cervical / Trunk Assessment: Back Surgery    Vision       Perception     Praxis      Cognition Arousal/Alertness: Awake/alert Behavior During Therapy: Impulsive Overall Cognitive Status: Impaired/Different from baseline Area of Impairment: Attention, Memory, Following commands, Safety/judgement, Awareness, Problem solving                   Current Attention Level: Selective Memory: Decreased recall of precautions Following Commands: Follows multi-step commands inconsistently, Follows one step commands with increased time Safety/Judgement: Decreased awareness of safety Awareness: Emergent Problem Solving: Slow processing, Requires verbal cues, Requires tactile cues, Difficulty sequencing General Comments: cues to recall precautions        Exercises      Shoulder Instructions       General Comments      Pertinent Vitals/ Pain       Pain Assessment Pain Assessment: Faces Faces Pain Scale: Hurts a little bit Pain Location: back with movement Pain Descriptors / Indicators: Grimacing, Guarding, Sore, Aching Pain Intervention(s): Limited activity within patient's tolerance, Monitored during session, Repositioned  Home Living Family/patient expects to be discharged to:: Private residence Living Arrangements: Spouse/significant other Available Help at Discharge: Family;Available 24 hours/day Type of Home: House Home Access: Stairs to enter [employer] of Steps: 3 Entrance Stairs-Rails: Right Home Layout: Two level;Able to live on main level with bedroom/bathroom     Bathroom Shower/Tub:  Occupational psychologist: Standard Bathroom Accessibility: Yes   Home Equipment: Conservation officer, nature (2 wheels)   Additional Comments: needs increase in time and decrease in recall      Prior Functioning/Environment              Frequency  Min 2X/week        Progress Toward Goals  OT Goals(current goals can now be found in the care plan section)  Progress towards OT goals: Progressing toward goals  Acute Rehab OT Goals Patient Stated Goal: go home OT Goal Formulation: With patient Time For Goal Achievement: 11/13/21 Potential to Achieve Goals: Good ADL Goals Pt Will Perform Lower Body Bathing: with modified independence;sit to/from stand Pt Will Perform Lower Body Dressing: with modified independence;sit to/from stand Pt Will Transfer to Toilet: with modified independence;ambulating Additional ADL Goal #1: Pt will be able to complete bed mobility with min guard Additional ADL Goal #2: Pt will be able to don/doff brace with set up  Plan Discharge plan remains appropriate    Co-evaluation                 AM-PAC OT "6 Clicks" Daily Activity     Outcome Measure   Help from another person eating meals?: None Help from another person taking care of personal grooming?: A Little Help from another person toileting, which includes using toliet, bedpan, or urinal?: A Little Help from another person bathing (including washing, rinsing, drying)?: A Little Help from another person to put on and taking off regular upper body clothing?: None Help from another person to put on and taking off regular lower body clothing?: A Little 6 Click  Score: 20    End of Session Equipment Utilized During Treatment: Gait belt;Rolling walker (2 wheels);Back brace  OT Visit Diagnosis: Unsteadiness on feet (R26.81);Other abnormalities of gait and mobility (R26.89);Repeated falls (R29.6);Muscle weakness (generalized) (M62.81);Pain   Activity Tolerance Patient tolerated treatment  well   Patient Left in chair;with call bell/phone within reach   Nurse Communication Mobility status        Time: 5993-5701 OT Time Calculation (min): 30 min  Charges: OT General Charges $OT Visit: 1 Visit OT Treatments $Self Care/Home Management : 23-37 mins  Lodema Hong, [place]  Office [phone]   Trixie Dredge 10/31/2021, 10:21 AM

## 2021-11-01 MED FILL — Sodium Chloride IV Soln 0.9%: INTRAVENOUS | Qty: 1000 | Status: AC

## 2021-11-01 MED FILL — Heparin Sodium (Porcine) Inj 1000 Unit/ML: INTRAMUSCULAR | Qty: 30 | Status: AC

## 2021-11-02 NOTE — Discharge Summary (Signed)
Patient ID: Destiny Roth MRN: [id number] DOB/AGE: April 30, 1950 71 y.o.  Admit date: 10/29/2021 Discharge date: 10/31/2021  Admission Diagnoses:  Principal Problem:   S/P lumbar fusion   Discharge Diagnoses:  Principal Problem:   S/P lumbar fusion  status post Procedure(s): OBLIQUE LUMBAR INTERBODY FUSION WITH SUPPLEMENTAL PEDICLE SCREW FIXATION LUMBAR FOUR TO FIVE LATERAL EXPOSURE FOR OBLIQUE SPINE SURGERY  Past Medical History:  Diagnosis Date   Abdominal pain, epigastric 09/18/2009   Anosmia 11/06/2010   ANXIETY 09/23/2006   BUNION, RIGHT FOOT 09/18/2009   CARPAL TUNNEL SYNDROME, BILATERAL 09/23/2006   DEPRESSION 09/23/2006   DIVERTICULOSIS, COLON 09/23/2006   GERD 05/24/2008   GLUCOSE INTOLERANCE 05/24/2008   Hemorrhoids    Hypercalcemia    HYPERLIPIDEMIA 09/23/2006   HYPERTENSION 09/23/2006   IBS 09/23/2006   Impaired glucose tolerance 11/04/2010   OSTEOPOROSIS 05/24/2008   06/01/19-pt states osteopenia   Parkinson's disease    Restrictive lung disease 12/20/2013   Sarcoidosis 02/26/2019    Surgeries: Procedure(s): OBLIQUE LUMBAR INTERBODY FUSION WITH SUPPLEMENTAL PEDICLE SCREW FIXATION LUMBAR FOUR TO FIVE LATERAL EXPOSURE FOR OBLIQUE SPINE SURGERY on 10/29/2021   Consultants: Treatment Team:  Marty Heck, MD  Discharged Condition: Improved  Hospital Course: Destiny Roth is an 71 y.o. female who was admitted 10/29/2021 for operative treatment of S/P lumbar fusion. Patient failed conservative treatments (please see the history and physical for the specifics) and had severe unremitting pain that affects sleep, daily activities and work/hobbies. After pre-op clearance, the patient was taken to the operating room on 10/29/2021 and underwent  Procedure(s): OBLIQUE LUMBAR INTERBODY FUSION WITH SUPPLEMENTAL PEDICLE SCREW FIXATION LUMBAR FOUR TO FIVE LATERAL EXPOSURE FOR OBLIQUE SPINE SURGERY.    Patient was given perioperative antibiotics:  Anti-infectives  (From admission, onward)    Start     Dose/Rate Route Frequency Ordered Stop   10/29/21 1600  ceFAZolin (ANCEF) IVPB 1 g/50 mL premix        1 g 100 mL/hr over 30 Minutes Intravenous Every 8 hours 10/29/21 1318 10/30/21 1444   10/29/21 0549  ceFAZolin (ANCEF) IVPB 2g/100 mL premix        2 g 200 mL/hr over 30 Minutes Intravenous 30 min pre-op 10/29/21 0549 10/29/21 0826        Patient was given sequential compression devices and early ambulation to prevent DVT.   Patient benefited maximally from hospital stay and there were no complications. At the time of discharge, the patient was urinating/moving their bowels without difficulty, tolerating a regular diet, pain is controlled with oral pain medications and they have been cleared by PT/OT.   Recent vital signs: No data found.   Recent laboratory studies:  Recent Labs    10/31/21 2118  WBC 7.8  HGB 12.0  HCT 35.2*  PLT 221  NA 136  K 3.7  CL 102  CO2 23  BUN 12  CREATININE 1.01*  GLUCOSE 129*  CALCIUM 9.1     Discharge Medications:   Allergies as of 10/31/2021       Reactions   Myrbetriq [mirabegron] Other (See Comments)   SEVERE muscle and joint pain   Atorvastatin Other (See Comments)   Muscle pain Muscle pain myalgia   Zocor [simvastatin] Other (See Comments)   Muscle pain        Medication List     STOP taking these medications    amLODipine 2.5 MG tablet Commonly known as: NORVASC   cephALEXin 500 MG capsule Commonly known as: KEFLEX  CRANBERRY PO   diphenhydramine-acetaminophen 25-500 MG Tabs tablet Commonly known as: TYLENOL PM   gabapentin 100 MG capsule Commonly known as: NEURONTIN   Melatonin 5 MG Caps   methotrexate 10 MG tablet Commonly known as: RHEUMATREX   methotrexate 2.5 MG tablet Commonly known as: RHEUMATREX   Vitamin D3 50 MCG (2000 UT) capsule       TAKE these medications    acetaminophen 500 MG tablet Commonly known as: TYLENOL Take 500 mg by mouth every 6  (six) hours as needed for mild pain.   Amantadine HCl 100 MG tablet Take 100 mg by mouth in the morning, at noon, and at bedtime.   aspirin EC 81 MG tablet Take 81 mg by mouth daily.   Biotin 1 MG Caps Take 1 mg by mouth daily at 2 PM.   carbidopa-levodopa 25-100 MG tablet Commonly known as: SINEMET IR Take 0.5-1 tablets by mouth See admin instructions. Take 1 tablet by mouth in the morning and then take half tablet by mouth at noon, and 4 pm and at bedtime per patient   Carbidopa-Levodopa ER 25-100 MG tablet controlled release Commonly known as: SINEMET CR Take 1 tablet by mouth 2 (two) times daily.   folic acid 1 MG tablet Commonly known as: FOLVITE Take 1 tablet by mouth daily.   Humira Pen 40 MG/0.8ML Pnkt Generic drug: Adalimumab Inject 0.4 mLs into the skin every 14 (fourteen) days.   Incruse Ellipta 62.5 MCG/ACT Aepb Generic drug: umeclidinium bromide 1 puff daily.   methocarbamol 500 MG tablet Commonly known as: ROBAXIN Take 1 tablet (500 mg total) by mouth every 8 (eight) hours as needed for up to 5 days for muscle spasms.   ondansetron 4 MG tablet Commonly known as: Zofran Take 1 tablet (4 mg total) by mouth every 8 (eight) hours as needed for nausea or vomiting.   oxyCODONE-acetaminophen 10-325 MG tablet Commonly known as: Percocet Take 1 tablet by mouth every 6 (six) hours as needed for up to 5 days for pain.   rOPINIRole 1 MG tablet Commonly known as: REQUIP Take 1 tablet (1 mg total) by mouth 3 (three) times daily.   rosuvastatin 10 MG tablet Commonly known as: CRESTOR TAKE 1 TABLET BY MOUTH EVERY DAY What changed: Another medication with the same name was removed. Continue taking this medication, and follow the directions you see here.   senna 8.6 MG Tabs tablet Commonly known as: SENOKOT Take 1 tablet (8.6 mg total) by mouth 2 (two) times daily.   sertraline 100 MG tablet Commonly known as: ZOLOFT Take 1 tablet by mouth daily.         Diagnostic Studies: DG Chest 2 View  Result Date: 10/31/2021 CLINICAL DATA:  Altered mental status EXAM: CHEST - 2 VIEW COMPARISON:  Chest x-ray 03/29/2020 FINDINGS: The heart size and mediastinal contours are within normal limits. Both lungs are clear. The visualized skeletal structures are unremarkable. IMPRESSION: No active cardiopulmonary disease. Electronically Signed   By: Ronney Asters M.D.   On: 10/31/2021 21:53   DG Lumbar Spine 2-3 Views  Result Date: 10/29/2021 CLINICAL DATA:  Fluoroscopic assistance for lumbar spine surgery EXAM: LUMBAR SPINE - 2-3 VIEW COMPARISON:  None Available. FINDINGS: Fluoroscopic images show posterior surgical fusion at L4-L5 level. There is minimal anterolisthesis at L4-L5 level. Intervertebral disc spacer is noted in place. Fluoroscopy time 232 seconds. Radiation dose 107.84 mGy. IMPRESSION: Fluoroscopic assistance was provided for surgical fusion at the L4-L5 level. Electronically Signed   By:  Elmer Picker M.D.   On: 10/29/2021 13:24   DG C-Arm 1-60 Min-No Report  Result Date: 10/29/2021 Fluoroscopy was utilized by the requesting physician.  No radiographic interpretation.   DG C-Arm 1-60 Min-No Report  Result Date: 10/29/2021 Fluoroscopy was utilized by the requesting physician.  No radiographic interpretation.   DG C-Arm 1-60 Min-No Report  Result Date: 10/29/2021 Fluoroscopy was utilized by the requesting physician.  No radiographic interpretation.   DG OR LOCAL ABDOMEN  Result Date: 10/29/2021 CLINICAL DATA:  Intraop x-ray; status post L4-L5 interbody fusion EXAM: OR LOCAL ABDOMEN COMPARISON:  None Available. FINDINGS: Per discussion with OR team, exam performed per protocol with no retained foreign object expected. Orthopedic hardware noted at the L4-L5 disc space. Scattered staples are visualized. No unexpected retained foreign object. IMPRESSION: No evidence of unexpected retained foreign object. Over the were called by telephone at  the time of interpretation on 10/29/2021 at 10:13 am to provider Cheek, RN, who verbally acknowledged these results. Electronically Signed   By: Yetta Glassman M.D.   On: 10/29/2021 10:14    Discharge Instructions     Incentive spirometry RT   Complete by: As directed         Follow-up Information     Melina Schools, MD. Schedule an appointment as soon as possible for a visit in 2 week(s).   Specialty: Orthopedic Surgery Why: If symptoms worsen, For wound re-check, For suture removal Contact information: [address] [place] [address] [phone]         [facility] [place] Follow up.   Specialty: Home Health Services Why: The home health agency will contact you for the first home visit. Contact information: [address] [place] [address] [phone]                 Discharge Plan:  discharge to home  Disposition: Patient is ambulating independently and has been cleared by physical therapy.  She is remained alert and oriented x3.  Her incisions are clean dry and intact and she is tolerating a regular diet.  She has positive flatus and has voided spontaneously.  She will be discharged to home and follow-up with me in 2 weeks for reevaluation.  Medications as well as instructions have been provided.    Signed: Dahlia Bailiff for Dr. Melina Schools [facility] [phone] 11/02/2021, 11:11 AM

## 2021-11-29 ENCOUNTER — Other Ambulatory Visit: Payer: Self-pay | Admitting: Pulmonary Disease

## 2021-11-29 DIAGNOSIS — Z79899 Other long term (current) drug therapy: Secondary | ICD-10-CM

## 2021-11-29 DIAGNOSIS — D869 Sarcoidosis, unspecified: Secondary | ICD-10-CM

## 2021-11-30 DIAGNOSIS — I10 Essential (primary) hypertension: Secondary | ICD-10-CM | POA: Diagnosis not present

## 2021-11-30 DIAGNOSIS — Z23 Encounter for immunization: Secondary | ICD-10-CM | POA: Diagnosis not present

## 2021-11-30 DIAGNOSIS — R2681 Unsteadiness on feet: Secondary | ICD-10-CM | POA: Diagnosis not present

## 2021-11-30 DIAGNOSIS — N1832 Chronic kidney disease, stage 3b: Secondary | ICD-10-CM | POA: Diagnosis not present

## 2021-11-30 DIAGNOSIS — M545 Low back pain, unspecified: Secondary | ICD-10-CM | POA: Diagnosis not present

## 2021-11-30 DIAGNOSIS — F3341 Major depressive disorder, recurrent, in partial remission: Secondary | ICD-10-CM | POA: Diagnosis not present

## 2021-11-30 DIAGNOSIS — G20C Parkinsonism, unspecified: Secondary | ICD-10-CM | POA: Diagnosis not present

## 2021-12-11 DIAGNOSIS — Z4889 Encounter for other specified surgical aftercare: Secondary | ICD-10-CM | POA: Diagnosis not present

## 2021-12-14 ENCOUNTER — Ambulatory Visit (INDEPENDENT_AMBULATORY_CARE_PROVIDER_SITE_OTHER): Payer: Medicare Other | Admitting: Pulmonary Disease

## 2021-12-14 ENCOUNTER — Other Ambulatory Visit (HOSPITAL_BASED_OUTPATIENT_CLINIC_OR_DEPARTMENT_OTHER): Payer: Self-pay | Admitting: Pulmonary Disease

## 2021-12-14 ENCOUNTER — Encounter (HOSPITAL_BASED_OUTPATIENT_CLINIC_OR_DEPARTMENT_OTHER): Payer: Self-pay | Admitting: Pulmonary Disease

## 2021-12-14 VITALS — BP 126/82 | HR 91 | Ht <= 58 in | Wt 123.0 lb

## 2021-12-14 DIAGNOSIS — R2689 Other abnormalities of gait and mobility: Secondary | ICD-10-CM | POA: Insufficient documentation

## 2021-12-14 DIAGNOSIS — D869 Sarcoidosis, unspecified: Secondary | ICD-10-CM

## 2021-12-14 DIAGNOSIS — M5451 Vertebrogenic low back pain: Secondary | ICD-10-CM | POA: Diagnosis not present

## 2021-12-14 MED ORDER — INCRUSE ELLIPTA 62.5 MCG/ACT IN AEPB: 1.0000 | INHALATION_SPRAY | Freq: Every day | RESPIRATORY_TRACT | 5 refills | Status: DC

## 2021-12-14 MED ORDER — METHOTREXATE SODIUM 10 MG PO TABS: 10.0000 mg | ORAL_TABLET | ORAL | 2 refills | Status: DC

## 2021-12-14 NOTE — Progress Notes (Unsigned)
Subjective:   PATIENT ID: Destiny Roth: female DOB: [birth year], MRN: [id number]   HPI  Chief Complaint  Patient presents with   Follow-up    Recent back surgery    Reason for Visit: Follow-up  Destiny Roth is a 71 year old with Parkinson's disease, HTN, HLD and restrictive lung disease who presents for follow-up for sarcoid management.  Synopsis:  Initially seen as an inpatient Pulmonary consult by me on 02/23/19 for hypercalcemia. Bone biopsy was performed and demonstrated granulomas disease without necrosis. She was started on steroids and discharged with Endocrine and Pulmonary follow-up. Of note, she was previously seen in our Pulmonary clinic in 2018 for dyspnea and mild sleep apnea. Her prior PFTs demonstrate mild restrictive defect with normal DLCO which patient stated was attributed to her Parkinson's disease.  2021 - She is currently on tacrolimus 1 mg BID, methotrexate and prednisone. She continues to have fatigue and visual blurriness. Her dyspnea also remains unchanged. Worsens with exertion. Tacrolimus was discontinued by ophthalmology due tremors. Started on CellCept in November 2021.  2022 - Weaned off methotrexate. She still continues to have issues with fatigue, tremors and word finding after switching to Cellcept. When seen by her neurologist, she reports that her Parkinson's is stable. She was referred to Dr. Harriette Ohara to evaluate for neuro involvement of her sarcoid. Ophthalmology appt 03/14/20 ocular exam shows stable inflammation. Her ophthalmology note from 05/16/20 she has requested stopping taking immunosuppressants to see if these are the cause of her symptoms of fatigue, memory and tremor issues. Her fatigue and tremors have improved off the cellcept however it remains persistent. Unclear if her memory is better off cellcept. Started on Humira on 07/11/20. Started on Amantadine for progressive dyskinesia related to her Parkinsons. She is unsure  if the inhaler helps but she is compliant with Incruse daily. She was seen by Neurology and gabapentin increased for newly diagnosed small fiber neuropathy. She reports that her vision is still distorted and occurs in the day but worsens at night.  2023 Ophthalmology noted by Dr. Manuella Ghazi on 01/16/21 reviewed with no signs of active inflammation on eye exam. She reports that she is struggling with her lower extremity neuropathy which worsens her balance. She has been compliant with methotrexate and Humira. She is potentially planning for spine surgery (C and L). She is having some hair loss that started with methotrexate use. She reports her vision is unchanged.  12/14/21 She is having balance issues and currently in physical therapy. Presents with her husband. She reports her breathing is still labored. No longer feels tight. Notices more mouth breathing. Stable productive cough daily in the morning and evening. After her surgery in October 2nd, she resumed her humira and methotrexate  Social History: Never smoker  Past Medical History:  Diagnosis Date   Abdominal pain, epigastric 09/18/2009   Anosmia 11/06/2010   ANXIETY 09/23/2006   BUNION, RIGHT FOOT 09/18/2009   CARPAL TUNNEL SYNDROME, BILATERAL 09/23/2006   DEPRESSION 09/23/2006   DIVERTICULOSIS, COLON 09/23/2006   GERD 05/24/2008   GLUCOSE INTOLERANCE 05/24/2008   Hemorrhoids    Hypercalcemia    HYPERLIPIDEMIA 09/23/2006   HYPERTENSION 09/23/2006   IBS 09/23/2006   Impaired glucose tolerance 11/04/2010   OSTEOPOROSIS 05/24/2008   06/01/19-pt states osteopenia   Parkinson's disease    Restrictive lung disease 12/20/2013   Sarcoidosis 02/26/2019      Outpatient Medications Prior to Visit  Medication Sig Dispense Refill   acetaminophen (TYLENOL) 500 MG tablet  Take 500 mg by mouth every 6 (six) hours as needed for mild pain.     Adalimumab (HUMIRA PEN) 40 MG/0.8ML PNKT Inject 0.4 mLs into the skin every 14 (fourteen) days.     Amantadine HCl 100  MG tablet Take 100 mg by mouth in the morning, at noon, and at bedtime.     aspirin EC 81 MG tablet Take 81 mg by mouth daily.     Biotin 1 MG CAPS Take 1 mg by mouth daily at 2 PM. 30 capsule 5   carbidopa-levodopa (SINEMET IR) 25-100 MG tablet Take 0.5-1 tablets by mouth See admin instructions. Take 1 tablet by mouth in the morning and then take half tablet by mouth at noon, and 4 pm and at bedtime per patient     Carbidopa-Levodopa ER (SINEMET CR) 25-100 MG tablet controlled release Take 1 tablet by mouth 2 (two) times daily.     folic acid (FOLVITE) 1 MG tablet TAKE 1 TABLET BY MOUTH EVERY DAY 90 tablet 2   methotrexate (RHEUMATREX) 2.5 MG tablet Take 2.5 mg by mouth once a week. Caution:Chemotherapy. Protect from light. Every Tuesday takes total of 72m     ondansetron (ZOFRAN) 4 MG tablet Take 1 tablet (4 mg total) by mouth every 8 (eight) hours as needed for nausea or vomiting. 20 tablet 0   rOPINIRole (REQUIP) 1 MG tablet Take 1 tablet (1 mg total) by mouth 3 (three) times daily. 90 tablet 0   rosuvastatin (CRESTOR) 10 MG tablet TAKE 1 TABLET BY MOUTH EVERY DAY 90 tablet 1   senna (SENOKOT) 8.6 MG TABS tablet Take 1 tablet (8.6 mg total) by mouth 2 (two) times daily. 30 tablet 0   sertraline (ZOLOFT) 100 MG tablet Take 1 tablet by mouth daily.     umeclidinium bromide (INCRUSE ELLIPTA) 62.5 MCG/ACT AEPB 1 puff daily.     No facility-administered medications prior to visit.    Review of Systems  Constitutional:  Negative for chills, diaphoresis, fever, malaise/fatigue and weight loss.  HENT:  Negative for congestion.   Respiratory:  Positive for cough, sputum production and shortness of breath. Negative for hemoptysis and wheezing.   Cardiovascular:  Negative for chest pain, palpitations and leg swelling.     Objective:   Vitals:   12/14/21 1518  BP: 126/82  Pulse: 91  SpO2: 97%  Weight: 123 lb (55.8 kg)  Height: _0  (1.473 m)   SpO2: 97 % O2 Device: None (Room  air)  Physical Exam: General: Well-appearing, no acute distress HENT: , AT Eyes: EOMI, no scleral icterus Respiratory: Clear to auscultation bilaterally.  No crackles, wheezing or rales Cardiovascular: RRR, -M/R/G, no JVD Extremities:-Edema,-tenderness Neuro: AAO x4, CNII-XII grossly intact Psych: Normal mood, normal affect  Data Reviewed:  Imaging: CXR 02/23/19 - interval resolutions of bilateral pleural effusions CT Chest 02/09/19 - Bilateral pleural effusions. Mild ground glass opacifications bilaterally. No enlarged mediastinal or hilar adenopathy. PET Myocardial 01/07/20 - No evidence of increased uptake PET/CT 01/07/20 1. Bilateral ground-glass pulmonary opacities demonstrate mild-moderate FDG avidity. These findings are nonspecific and may be infectious or inflammatory.2. Mild FDG activity of non-enlarged bilateral hilar lymph nodes is also nonspecific.  This could be reactive in the setting of the pulmonary findings, but activity related to sarcoid cannot be excluded.  CXR 03/30/20 - No infiltrate, effusion or edema.  PET Myocardial 03/09/21 - Persistent FDG avid bilateral hilar lymphadenopathy. New FDG avid prevascular lymph node. Increased bilateral FDG avid groundglass pulmonary opacities. These remain  nonspecific and are again favored to be infectious/ inflammatory etiology. No evidence of active cardiac sarcoidosis.   PFT: 12/17/13 FVC 2.26 (86%) FEV1 1.88 (93%) Ratio 78  TLC 70% DLCO 75% Interpretation: Mild restrictive defect with mild reduction in gas exchange (uncorrected DLCO)  06/22/19 FVC 2.07 (95%) FEV1 1.75 (107%) Ratio 80  TLC 91% DLCO 63% Interpretation: No obstructive or restrictive defect. Mild reduction in gas exchange   06/10/20 FVC 2.11 (99%) FEV1 1.8 (113%) Ratio 82  TLC 132% RV 162% RV/TLC 130% DLCO 94% Interpretation: Normal spirometry and gas exchange. Compared to prior PFTs, patient has had increased lung volumes suggestive of air trapping. No significant  bronchodilator response however does not preclude benefit. Clinically correlate  09/12/20 FVC 2.17 (102%) FEV1 1.87 (117%) Ratio 86  TLC 105% DLCO 88% Interpretation: Normal PFTs. Air trapping has resolved.  Echocardiogram: 04/22/19 - EF 60-65%. Grade I DD. No WMA or valvular abnormalities.  CBC    Latest Ref Rng & Units 10/31/2021    9:18 PM 10/19/2021    3:30 PM 09/20/2021    4:12 PM  CBC  WBC 4.0 - 10.5 K/uL 7.8  5.4  5.4   Hemoglobin 12.0 - 15.0 g/dL 12.0  13.8  14.1   Hematocrit 36.0 - 46.0 % 35.2  42.0  42.9   Platelets 150 - 400 K/uL 221  238  264.0       Latest Ref Rng & Units 10/31/2021    9:18 PM 10/19/2021    3:30 PM 09/20/2021    4:12 PM  BMP  Glucose 70 - 99 mg/dL 129  141  97   BUN 8 - 23 mg/dL _0 Creatinine 0.44 - 1.00 mg/dL 1.01  1.30  1.41   Sodium 135 - 145 mmol/L 136  138  138   Potassium 3.5 - 5.1 mmol/L 3.7  4.6  4.5   Chloride 98 - 111 mmol/L 102  105  102   CO2 22 - 32 mmol/L _1 Calcium 8.9 - 10.3 mg/dL 9.1  9.7  10.2   Creatinine improved     Latest Ref Rng & Units 10/31/2021    9:18 PM 09/20/2021    4:12 PM 07/19/2021    2:04 PM  Hepatic Function  Total Protein 6.5 - 8.1 g/dL 6.7  7.7  7.5   Albumin 3.5 - 5.0 g/dL 3.3  4.5  4.3   AST 15 - 41 U/L 62  21  30   ALT 0 - 44 U/L _2 Alk Phosphatase 38 - 126 U/L 54  64  66   Total Bilirubin 0.3 - 1.2 mg/dL 0.8  0.5  0.5   AST slightly elevated     Latest Ref Rng & Units 06/30/2020    4:14 PM  Quantiferon TB Gold  Quantiferon TB Gold Plus NEGATIVE NEGATIVE     Assessment & Plan:   Discussion: 71 year old female with sarcoid, Parkinson's disease who presents for follow-up. Previously on methotrexate and Cellcept however discontinued due to adverse effects including fatigue and difficulty concentrating. Currently on Humira and methotrexate. If eye symptoms stable for two years, will consider titrating off at that time. Counseled on high risk medication management as noted  below.  Sarcoid with pulm, ocular, renal, bone and small fiber neuropathy involvement --Dx 02/26/19 vis bone biopsy at [facility] --Last PFT 08/2020: Normal spirometry --Echo reviewed from 03/2019 with grade I  DD. EKG 03/2020 with unchanged prolonged PR interval   --Sarcoid PET 12/2019 - negative for cardiac involvement. Suggests pulmonary involvement --Sarcoid PET 03/09/21 - active pulmonary involvement. Neg cardiac involvement.  --Trend CBC and CMP q 3 months. Will monitor TB gold annually.  High risk medication management --Completed prednisone taper 03/2020 --Started methotrexate 04/2020 --Started cellcept 07/2020. 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 Contact Dr. Manuella Ghazi --Immunosuppressants as above --Reviewed Neuro notes 12/28/20 - visual distortions may be related to Parkinson's  Shortness of breath secondary to sarcoid, deconditioning - improved on prednisone taper however persistent  PFT 08/2020  normal. 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 Johnney Ou. Will CC notes --Will need to resume monthly labs if restarted on Cellcept  History of hypercalcemia secondary to sarcoid - no recurrence --Serial monitoring --Management as above --Patient on vitamin D supplementation  Small fiber neuropathy secondary to sarcoid Parkinson's disease  --MRI Brain 03/2019 neg for neurosarcoid --Spine MRI 02/2020 neg for spinal sarcoid  --Followed by [facility]: Dr. Lezlie Octave for Parkinson's   --Followed [facility] Dr. [name] at [facility]. On gabapentin  Hair loss/alopecia --CONTINUE biotin 1 mg daily  Health Maintenance Immunization History  Administered Date(s) Administered   Influenza Split 12/06/2019   Influenza, High Dose Seasonal PF 11/30/2015, 11/16/2016, 10/29/2017, 10/30/2018, 12/09/2018, 10/31/2020   Influenza, Seasonal, Injecte, Preservative Fre 10/20/2012   Influenza,inj,Quad PF,6+ Mos 11/18/2013   Influenza-Unspecified 11/18/2013, 11/30/2014, 10/29/2017, 10/29/2018   Moderna Sars-Covid-2 Vaccination 03/12/2019, 04/09/2019, 12/06/2019, 05/18/2020   Pfizer Covid-19 Vaccine Bivalent Booster 4yr & up 06/05/2021   Pneumococcal Conjugate-13 01/01/2013, 01/05/2014   Pneumococcal Polysaccharide-23 01/01/2013, 01/05/2014, 02/25/2020   Tdap 11/06/2010, 05/01/2021   Zoster Recombinat (Shingrix) 01/10/2018, 03/31/2018   Zoster, Live 11/06/2010, 01/15/2018, 03/20/2018   CT Lung Screen - not qualified  Orders Placed This Encounter  Procedures   CBC w/Diff    Standing Status:   Future    Standing Expiration Date:   12/14/2022   Comp Met (CMET)    Standing Status:   Future    Standing Expiration Date:   12/14/2022   Meds ordered this encounter  Medications   DISCONTD: methotrexate (RHEUMATREX) 10 MG tablet    Sig: Take 1 tablet (10 mg total) by mouth once a week. Caution:Chemotherapy. Protect from light. Every Tuesday takes total of 169m   Dispense:  8 tablet    Refill:  2   umeclidinium bromide (INCRUSE ELLIPTA) 62.5 MCG/ACT AEPB    Sig: Inhale 1 puff into the lungs daily.    Dispense:  30 each    Refill:  5   Return in about 2 months (around 02/13/2022).  I have spent a total time of 35-minutes on the day of the appointment including chart review, data review, collecting history, coordinating care and discussing medical diagnosis and plan with the patient/family. Past medical history, allergies, medications were reviewed. Pertinent imaging, labs  and tests included in this note have been reviewed and interpreted independently by me.  ChEast St. LouisMD [facility] 12/14/2021 3:47 PM  Office Number [phone]

## 2021-12-14 NOTE — Patient Instructions (Addendum)
--  ORDER labs: CBC with diff and CMP for January and every 3 months for the next year --CONTINUE Humira and methotrexate as directed. --RESTART Incruse inhaler ONE puff once a day --Discussed vaccinations: Agree with RSV, COVID, influenza.   Follow-up with me in January 2024

## 2021-12-15 DIAGNOSIS — D869 Sarcoidosis, unspecified: Secondary | ICD-10-CM | POA: Diagnosis not present

## 2021-12-15 DIAGNOSIS — G20A1 Parkinson's disease without dyskinesia, without mention of fluctuations: Secondary | ICD-10-CM | POA: Diagnosis not present

## 2021-12-15 DIAGNOSIS — R41841 Cognitive communication deficit: Secondary | ICD-10-CM | POA: Diagnosis not present

## 2021-12-17 ENCOUNTER — Other Ambulatory Visit: Payer: Self-pay | Admitting: Pulmonary Disease

## 2021-12-17 DIAGNOSIS — D869 Sarcoidosis, unspecified: Secondary | ICD-10-CM | POA: Diagnosis not present

## 2021-12-17 DIAGNOSIS — M5451 Vertebrogenic low back pain: Secondary | ICD-10-CM | POA: Diagnosis not present

## 2021-12-17 DIAGNOSIS — R2689 Other abnormalities of gait and mobility: Secondary | ICD-10-CM | POA: Diagnosis not present

## 2021-12-17 NOTE — Telephone Encounter (Signed)
Refill has been sent today

## 2021-12-19 DIAGNOSIS — M5451 Vertebrogenic low back pain: Secondary | ICD-10-CM | POA: Diagnosis not present

## 2021-12-19 DIAGNOSIS — D869 Sarcoidosis, unspecified: Secondary | ICD-10-CM | POA: Diagnosis not present

## 2021-12-19 DIAGNOSIS — R2689 Other abnormalities of gait and mobility: Secondary | ICD-10-CM | POA: Diagnosis not present

## 2021-12-24 DIAGNOSIS — H3581 Retinal edema: Secondary | ICD-10-CM | POA: Diagnosis not present

## 2021-12-24 DIAGNOSIS — H4710 Unspecified papilledema: Secondary | ICD-10-CM | POA: Diagnosis not present

## 2021-12-24 DIAGNOSIS — Z79899 Other long term (current) drug therapy: Secondary | ICD-10-CM | POA: Diagnosis not present

## 2021-12-24 DIAGNOSIS — H30033 Focal chorioretinal inflammation, peripheral, bilateral: Secondary | ICD-10-CM | POA: Diagnosis not present

## 2021-12-24 DIAGNOSIS — D869 Sarcoidosis, unspecified: Secondary | ICD-10-CM | POA: Diagnosis not present

## 2021-12-24 DIAGNOSIS — H44113 Panuveitis, bilateral: Secondary | ICD-10-CM | POA: Diagnosis not present

## 2021-12-25 DIAGNOSIS — R2689 Other abnormalities of gait and mobility: Secondary | ICD-10-CM | POA: Diagnosis not present

## 2021-12-25 DIAGNOSIS — D869 Sarcoidosis, unspecified: Secondary | ICD-10-CM | POA: Diagnosis not present

## 2021-12-25 DIAGNOSIS — R42 Dizziness and giddiness: Secondary | ICD-10-CM | POA: Diagnosis not present

## 2021-12-25 DIAGNOSIS — G20B1 Parkinson's disease with dyskinesia, without mention of fluctuations: Secondary | ICD-10-CM | POA: Diagnosis not present

## 2021-12-26 DIAGNOSIS — D869 Sarcoidosis, unspecified: Secondary | ICD-10-CM | POA: Diagnosis not present

## 2021-12-26 DIAGNOSIS — I129 Hypertensive chronic kidney disease with stage 1 through stage 4 chronic kidney disease, or unspecified chronic kidney disease: Secondary | ICD-10-CM | POA: Diagnosis not present

## 2021-12-26 DIAGNOSIS — N1831 Chronic kidney disease, stage 3a: Secondary | ICD-10-CM | POA: Diagnosis not present

## 2021-12-27 ENCOUNTER — Encounter (HOSPITAL_BASED_OUTPATIENT_CLINIC_OR_DEPARTMENT_OTHER): Payer: Self-pay | Admitting: Pulmonary Disease

## 2021-12-28 DIAGNOSIS — R2689 Other abnormalities of gait and mobility: Secondary | ICD-10-CM | POA: Diagnosis not present

## 2021-12-28 DIAGNOSIS — D869 Sarcoidosis, unspecified: Secondary | ICD-10-CM | POA: Diagnosis not present

## 2021-12-28 DIAGNOSIS — M5451 Vertebrogenic low back pain: Secondary | ICD-10-CM | POA: Diagnosis not present

## 2021-12-31 DIAGNOSIS — D869 Sarcoidosis, unspecified: Secondary | ICD-10-CM | POA: Diagnosis not present

## 2021-12-31 DIAGNOSIS — R2689 Other abnormalities of gait and mobility: Secondary | ICD-10-CM | POA: Diagnosis not present

## 2021-12-31 DIAGNOSIS — M5451 Vertebrogenic low back pain: Secondary | ICD-10-CM | POA: Diagnosis not present

## 2022-01-01 ENCOUNTER — Other Ambulatory Visit: Payer: Self-pay | Admitting: Pulmonary Disease

## 2022-01-02 ENCOUNTER — Encounter (HOSPITAL_BASED_OUTPATIENT_CLINIC_OR_DEPARTMENT_OTHER): Payer: Self-pay | Admitting: Pulmonary Disease

## 2022-01-03 DIAGNOSIS — D8689 Sarcoidosis of other sites: Secondary | ICD-10-CM | POA: Diagnosis not present

## 2022-01-06 DIAGNOSIS — Z23 Encounter for immunization: Secondary | ICD-10-CM | POA: Diagnosis not present

## 2022-01-07 DIAGNOSIS — M5451 Vertebrogenic low back pain: Secondary | ICD-10-CM | POA: Diagnosis not present

## 2022-01-07 DIAGNOSIS — R2689 Other abnormalities of gait and mobility: Secondary | ICD-10-CM | POA: Diagnosis not present

## 2022-01-07 DIAGNOSIS — D869 Sarcoidosis, unspecified: Secondary | ICD-10-CM | POA: Diagnosis not present

## 2022-01-11 DIAGNOSIS — M5451 Vertebrogenic low back pain: Secondary | ICD-10-CM | POA: Diagnosis not present

## 2022-01-11 DIAGNOSIS — D869 Sarcoidosis, unspecified: Secondary | ICD-10-CM | POA: Diagnosis not present

## 2022-01-11 DIAGNOSIS — R2689 Other abnormalities of gait and mobility: Secondary | ICD-10-CM | POA: Diagnosis not present

## 2022-01-14 ENCOUNTER — Other Ambulatory Visit (HOSPITAL_BASED_OUTPATIENT_CLINIC_OR_DEPARTMENT_OTHER): Payer: Self-pay | Admitting: Pulmonary Disease

## 2022-01-14 ENCOUNTER — Telehealth: Payer: Self-pay | Admitting: Physical Therapy

## 2022-01-14 DIAGNOSIS — D869 Sarcoidosis, unspecified: Secondary | ICD-10-CM | POA: Diagnosis not present

## 2022-01-14 DIAGNOSIS — R2689 Other abnormalities of gait and mobility: Secondary | ICD-10-CM | POA: Diagnosis not present

## 2022-01-14 DIAGNOSIS — M5451 Vertebrogenic low back pain: Secondary | ICD-10-CM | POA: Diagnosis not present

## 2022-01-14 MED ORDER — METHOTREXATE SODIUM 10 MG PO TABS: 10.0000 mg | ORAL_TABLET | ORAL | 1 refills | Status: DC

## 2022-01-14 NOTE — Telephone Encounter (Signed)
Corrected medication to methotrexate 10 mg weekly (2.5 mg strength x 4 pills)

## 2022-01-14 NOTE — Telephone Encounter (Signed)
Received call back from [facility]. They confirmed via phone that they will remove imbalance code and only address back pain moving forward and patient will be treated for imbalance/dizziness at [facility] moving forward. Requested that they add a note to patient's documentation of this as well.

## 2022-01-14 NOTE — Telephone Encounter (Signed)
Called [facility] as they are currently seeing patient for back pain and balance deficits to see if they are also treating vertigo. They are primarily treating the back and would like imbalance and vertigo to be assessed and treated here if appropriate. They are planning on emailing to confirm.  Malachi Carl, PT, DPT

## 2022-01-14 NOTE — Telephone Encounter (Signed)
Called and LVM to patient's referral source from [facility] about resending referral for dizziness and giddiness only and taking off imbalance referral so that there is no overlap with current referral at [facility] who is also seeing her for imbalance deficits but is not addressing vertigo concerns.   Malachi Carl, PT, DPT

## 2022-01-14 NOTE — Telephone Encounter (Signed)
Please review pt needs refill on Methotrexate 25 mg x 4 pills (10 mg)

## 2022-01-15 ENCOUNTER — Encounter: Payer: Self-pay | Admitting: Physical Therapy

## 2022-01-15 ENCOUNTER — Other Ambulatory Visit: Payer: Self-pay

## 2022-01-15 ENCOUNTER — Ambulatory Visit: Payer: Medicare Other | Attending: Neurology | Admitting: Physical Therapy

## 2022-01-15 VITALS — BP 117/81 | HR 94

## 2022-01-15 DIAGNOSIS — R269 Unspecified abnormalities of gait and mobility: Secondary | ICD-10-CM | POA: Insufficient documentation

## 2022-01-15 DIAGNOSIS — R2681 Unsteadiness on feet: Secondary | ICD-10-CM | POA: Insufficient documentation

## 2022-01-15 DIAGNOSIS — R42 Dizziness and giddiness: Secondary | ICD-10-CM | POA: Diagnosis not present

## 2022-01-15 NOTE — Addendum Note (Signed)
Addended by: Malachi Carl A on: 01/15/2022 04:24 PM   Modules accepted: Orders

## 2022-01-15 NOTE — Addendum Note (Signed)
Addended by: Malachi Carl A on: 01/15/2022 04:22 PM   Modules accepted: Orders

## 2022-01-15 NOTE — Therapy (Addendum)
OUTPATIENT PHYSICAL THERAPY VESTIBULAR EVALUATION  Patient Name: Destiny Roth MRN: [id number] DOB:1950-08-23, 71 y.o., female Today's Date: 01/15/2022  END OF SESSION:  PT End of Session - 01/15/22 1324     Visit Number 1    Number of Visits 17    Date for PT Re-Evaluation 02/26/22    Authorization Type Medicare Part A & B    PT Start Time 1323    PT Stop Time 1430    PT Time Calculation (min) 67 min    Equipment Utilized During Treatment Back brace    Activity Tolerance Patient tolerated treatment well    Behavior During Therapy WFL for tasks assessed/performed             Past Medical History:  Diagnosis Date   Abdominal pain, epigastric 09/18/2009   Anosmia 11/06/2010   ANXIETY 09/23/2006   BUNION, RIGHT FOOT 09/18/2009   CARPAL TUNNEL SYNDROME, BILATERAL 09/23/2006   DEPRESSION 09/23/2006   DIVERTICULOSIS, COLON 09/23/2006   GERD 05/24/2008   GLUCOSE INTOLERANCE 05/24/2008   Hemorrhoids    Hypercalcemia    HYPERLIPIDEMIA 09/23/2006   HYPERTENSION 09/23/2006   IBS 09/23/2006   Impaired glucose tolerance 11/04/2010   OSTEOPOROSIS 05/24/2008   06/01/19-pt states osteopenia   Parkinson's disease    Restrictive lung disease 12/20/2013   Sarcoidosis 02/26/2019   Past Surgical History:  Procedure Laterality Date   ABDOMINAL EXPOSURE N/A 10/29/2021   Procedure: LATERAL EXPOSURE FOR OBLIQUE SPINE SURGERY;  Surgeon: Marty Heck, MD;  Location: [place];  Service: Vascular;  Laterality: N/A;   CARDIAC CATHETERIZATION     CATARACT EXTRACTION     COLONOSCOPY  2008   COLONOSCOPY W/ BIOPSIES  2008   Dr. Collene Mares -negative random bxs   ESOPHAGOGASTRODUODENOSCOPY  2008   Dr. Harriette Bouillon duodenal ulcer   FOOT SURGERY Bilateral 2015   LEFT AND RIGHT HEART CATHETERIZATION WITH CORONARY ANGIOGRAM N/A 11/09/2013   Procedure: LEFT AND RIGHT HEART CATHETERIZATION WITH CORONARY ANGIOGRAM;  Surgeon: Peter M Martinique, MD;  Location: [facility] CATH LAB;  Service: Cardiovascular;  Laterality:  N/A;   nasal septum repair     OBLIQUE LUMBAR INTERBODY FUSION 1 LEVEL WITH PERCUTANEOUS SCREWS N/A 10/29/2021   Procedure: OBLIQUE LUMBAR INTERBODY FUSION WITH SUPPLEMENTAL PEDICLE SCREW FIXATION LUMBAR FOUR TO FIVE;  Surgeon: Melina Schools, MD;  Location: [place];  Service: Orthopedics;  Laterality: N/A;   s/p bilat CTS     s/p fibroid ablation     s/p ganglion cyst right wrist     s/p right thumb trigger finger surgury     SHOULDER SURGERY Right 2015   TUBAL LIGATION     UPPER GASTROINTESTINAL ENDOSCOPY     Patient Active Problem List   Diagnosis Date Noted   S/P lumbar fusion 10/29/2021   Small fiber neuropathy 11/20/2020   Lumbar radiculopathy 06/28/2020   Chronic neck pain 06/20/2020   Degenerative spondylolisthesis 06/20/2020   High risk medication use 04/27/2020   Panuveitis of both eyes 04/27/2020   Parkinson's disease 09/19/2019   Optic nerve edema 07/06/2019   Peripheral focal chorioretinal inflammation of both eyes 07/06/2019   Retinal edema 07/06/2019   Therapeutic drug monitoring 05/26/2019   Visual disturbance 04/27/2019   Sarcoidosis 02/26/2019   Hypercalcemia 02/04/2019   Generalized weakness 02/04/2019   Left-sided vestibular weakness 07/24/2017   Moderate episode of recurrent major depressive disorder ([place]) 05/28/2017   RBD (REM behavioral disorder) 10/16/2016   Chronic depression 10/16/2016   Fatigue 10/16/2016   Vertigo 03/12/2016  Pulmonary air trapping 02/24/2015   DOE (dyspnea on exertion) 11/18/2013   Hot flashes 11/18/2013   Memory impairment 08/04/2013   Orthostasis 02/26/2013   Tachycardia 02/24/2013   Coronary artery calcification seen on CAT scan 01/01/2013   Tendonitis, calcific, shoulder 01/01/2013   OSA (obstructive sleep apnea) 01/01/2013   PD (Parkinson's disease) ([place]) 11/26/2011   Hematochezia 11/09/2011   Colon polyps 11/09/2011   IBS (irritable bowel syndrome) 11/09/2011   Anosmia 11/06/2010   Impaired glucose tolerance  11/04/2010   Hearing loss 05/07/2010   Preventative health care 05/07/2010   BUNION, RIGHT FOOT 09/18/2009   GERD 05/24/2008   OSTEOPOROSIS 05/24/2008   Hyperlipidemia 09/23/2006   Anxiety state 09/23/2006   Depression 09/23/2006   CARPAL TUNNEL SYNDROME, BILATERAL 09/23/2006   Essential hypertension 09/23/2006   DIVERTICULOSIS, COLON 09/23/2006   Carpal tunnel syndrome, bilateral 09/23/2006    PCP: Dr Leeroy Cha REFERRING PROVIDER: Danie Binder, FNP  REFERRING DIAG: R42 (ICD-10-CM) - Dizziness and giddiness R26.89 (ICD-10-CM) - Other abnormalities of gait and mobility  THERAPY DIAG:  Dizziness and giddiness - Plan: PT plan of care cert/re-cert  Abnormality of gait and mobility - Plan: PT plan of care cert/re-cert  Unsteadiness on feet - Plan: PT plan of care cert/re-cert  ONSET DATE: 65/99/3570 (referral date)  Rationale for Evaluation and Treatment: Rehabilitation  SUBJECTIVE:   SUBJECTIVE STATEMENT: Patient reports onset of dizziness since lumbar surgery performed on October 29, 2021. Patient previously treated for BPPV in 2018; cannot recall which ear. She thinks her symptoms are similar and notices symptoms with movement. She has had one fall as a result of dizziness. Patient also states that when she sits for too long the screen starts to go slanted. She has had some vision abnormalities since she was diagnosed with sclerosis which impacts her eyes and other parts of her body as well. She feports that she feels like her eyes cannot keep up with her head movements.  Pt accompanied by: significant other - husband [name]  PERTINENT HISTORY: Sarcodosis, small fiber neuropathy, lumbar fusion on October 2, Parkinson's Disease diangosed in 2018; see medical chart for additional details  PAIN:  Are you having pain? Yes - feet 7-8/10 and low back pain 5/10  PRECAUTIONS: Back and Fall  WEIGHT BEARING RESTRICTIONS: No  FALLS: Has patient fallen in last 6  months? Yes. Number of falls 1 - denies any injuries with falls but had mistep into sunken living room due to dizziness  Previous falls a few years ago when she cut her head.   LIVING ENVIRONMENT: Lives with: lives with their family Lives in: House/apartment Stairs: 2 steps into the living room, 4 steps into house with a handrail Has following equipment at home: Single point cane and Walker - 2 wheeled  PLOF: Independent  PATIENT GOALS: "To get rid or improve this feeling of dizziness."  OBJECTIVE:   DIAGNOSTIC FINDINGS:  IMPRESSION from 10/29/2021 Surgical note: Fluoroscopic assistance was provided for surgical fusion at the L4-L5 level.  COGNITION: Overall cognitive status: Within functional limits for tasks assessed   Cervical ROM:   Mild reductions throughout and slight ache with neck extension and rotation bilaterally but WFL  GAIT: Patient ambulates with decreased step length, reduced UE movement, and shuffling gait. Requires intermittent use of UE by holding onto spouse to come to stand. Unsteady.   PATIENT SURVEYS:  FOTO 54 adjusted  VESTIBULAR ASSESSMENT:  GENERAL OBSERVATION: Patient ambulates without AD but relies strongly on husband for stability. Increased  cervical guarding noted.   SYMPTOM BEHAVIOR:  Subjective history: See above  Non-Vestibular symptoms: changes in hearing, changes in vision, diplopia, neck pain, and tinnitus  Type of dizziness: Blurred Vision, Diplopia, Imbalance (Disequilibrium), Unsteady with head/body turns, "Funny feeling in the head", and "World moves"  Frequency: anytime she moves  Duration: till she stops moving  Aggravating factors: worse in evenings/afternoon, worse with movement  Relieving factors: sitting down and not moving head  Progression of symptoms: worse  OCULOMOTOR EXAM:  Ocular Alignment: normal  Ocular ROM: No Limitations  Spontaneous Nystagmus: absent  Gaze-Induced Nystagmus: absent  Smooth Pursuits:  extra eye  movements  Saccades: intact slower speed moving down  Convergence/Divergence: 10 cm   FRENZEL - FIXATION SUPRESSED:  Ocular Alignment: normal  Spontaneous Nystagmus: absent  Gaze-Induced Nystagmus: absent  Positional tests: To be assessed  VESTIBULAR - OCULAR REFLEX:   Slow VOR: Positive Bilaterally - slow speed reports mild dizziness;   VOR Cancellation: Normal Head-Impulse Test: HIT Left: positive mild delay with head thrust to left; may be impacted by cervical guarding  Dynamic Visual Acuity:  To be assessed   POSITIONAL TESTING: Other: To Be Assessed   OTHOSTATICS:  Vitals:   01/15/22 1337 01/15/22 1338  BP: 106/73 117/81  Pulse: 89 94  Initial taken seated Second taken standing Orthostatics: WFL  VESTIBULAR TREATMENT:                                                                                                    PATIENT EDUCATION: Education details: Educated patient on exam findings and results of symptoms Person educated: Patient and Spouse Education method: Explanation Education comprehension: verbalized understanding  HOME EXERCISE PROGRAM:  GOALS: Goals reviewed with patient? Yes  SHORT TERM GOALS: Target date: 02/05/2022  Patient will demonstrate 100% compliance with initial HEP to continue to progress between physical therapy sessions.   Baseline: To be provided Goal status: INITIAL  2.  Patient will improve FOTO score by 5 points to demonstrate improved function and mobility impacted by dizziness. Baseline: 33 Goal status: INITIAL  3.  Therapist will assess positional testing to assess for BPPV with goal to be added if appropriate in order to progress treatment plan moving forward. Baseline: To be assessed Goal status: INITIAL  LONG TERM GOALS: Target date: 02/26/2022   Patient will report demonstrate independence with final HEP in order to maintain current gains and continue to progress after physical therapy discharge.   Baseline: To be  provided Goal status: INITIAL  2.  Patient will improve FOTO score to 47 points to demonstrate improved function and mobility impacted by dizziness. Baseline: 33 Goal status: INITIAL  3.  Patient will report no dizziness with head turns, looking down, and side rolling to demonstrate improved functional mobility and return to PLOF. Baseline: Reports dizziness with all movements Goal status: INITIAL  ASSESSMENT:  CLINICAL IMPRESSION: Patient is a 71 y.o. female with Parkinson's Disease who was seen today for physical therapy evaluation and treatment for complaints of dizziness and imbalance. Patient is also currently being seen by [facility] for management after  lumbar fusion. [facility] was also addressing imbalance but discontinued prior to this session and will defer care in this area to this facility. Symptoms in today's treatment consistent with mild left hypofunction given abnormal head thrust to left and tolerance for only slow VOR in addition to visual impairments. Plan to evaluate positional testing in next session. Patient also subjectively reports and noted in session delayed eye movements when looking down and divergence outside of normal limits. Patient has already experienced one fall as a result of symptoms and will benefit from skilled physical therapy to address  OBJECTIVE IMPAIRMENTS: Abnormal gait, decreased balance, decreased mobility, difficulty walking, dizziness, and impaired vision/preception.   ACTIVITY LIMITATIONS: bending, sitting, standing, stairs, and transfers  PARTICIPATION LIMITATIONS: interpersonal relationship, driving, and community activity  PERSONAL FACTORS: Age, Transportation, and 3+ comorbidities: see above for details including PD, sclerosis, and  are also affecting patient's functional outcome.   REHAB POTENTIAL: Good  CLINICAL DECISION MAKING: Evolving/moderate complexity  EVALUATION COMPLEXITY: Moderate   PLAN:  PT FREQUENCY:  2x/week  PT DURATION: 6 weeks  PLANNED INTERVENTIONS: Therapeutic exercises, Therapeutic activity, Neuromuscular re-education, Balance training, Gait training, Patient/Family education, Self Care, Vestibular training, and Canalith repositioning  PLAN FOR NEXT SESSION: Positional testing with awareness of spinal precautions may require additional assist and add goal if appropriate; create HEP, Elnoria Howard Chart  Malachi Carl, PT, DPT  Esperanza Heir, PT 01/15/2022, 4:22 PM

## 2022-01-16 ENCOUNTER — Other Ambulatory Visit: Payer: Self-pay | Admitting: Pulmonary Disease

## 2022-01-16 NOTE — Telephone Encounter (Signed)
Refill sent.

## 2022-01-18 DIAGNOSIS — D869 Sarcoidosis, unspecified: Secondary | ICD-10-CM | POA: Diagnosis not present

## 2022-01-18 DIAGNOSIS — R2689 Other abnormalities of gait and mobility: Secondary | ICD-10-CM | POA: Diagnosis not present

## 2022-01-18 DIAGNOSIS — M5451 Vertebrogenic low back pain: Secondary | ICD-10-CM | POA: Diagnosis not present

## 2022-01-23 DIAGNOSIS — Z4889 Encounter for other specified surgical aftercare: Secondary | ICD-10-CM | POA: Diagnosis not present

## 2022-01-25 ENCOUNTER — Ambulatory Visit: Payer: Medicare Other | Admitting: Physical Therapy

## 2022-01-25 ENCOUNTER — Encounter: Payer: Self-pay | Admitting: Physical Therapy

## 2022-01-25 VITALS — BP 146/89 | HR 81

## 2022-01-25 DIAGNOSIS — R42 Dizziness and giddiness: Secondary | ICD-10-CM | POA: Diagnosis not present

## 2022-01-25 DIAGNOSIS — R269 Unspecified abnormalities of gait and mobility: Secondary | ICD-10-CM

## 2022-01-25 DIAGNOSIS — R2681 Unsteadiness on feet: Secondary | ICD-10-CM

## 2022-01-25 DIAGNOSIS — R2689 Other abnormalities of gait and mobility: Secondary | ICD-10-CM | POA: Diagnosis not present

## 2022-01-25 DIAGNOSIS — M5451 Vertebrogenic low back pain: Secondary | ICD-10-CM | POA: Diagnosis not present

## 2022-01-25 DIAGNOSIS — D869 Sarcoidosis, unspecified: Secondary | ICD-10-CM | POA: Diagnosis not present

## 2022-01-25 NOTE — Therapy (Signed)
OUTPATIENT PHYSICAL THERAPY VESTIBULAR EVALUATION  Patient Name: Destiny Roth MRN: [id number] DOB:[birth year], 71 y.o., female Today's Date: 01/25/2022  END OF SESSION:  PT End of Session - 01/25/22 1348     Visit Number 2    Number of Visits 13    Date for PT Re-Evaluation 02/26/22    Authorization Type Medicare Part A & B    PT Start Time 4098    PT Stop Time 1445    PT Time Calculation (min) 47 min    Equipment Utilized During Treatment Other (comment)   treatment performed primarily in sitting and always at edge of treatment table for safety; recommend use of gait belt for overground walking in clinic during session   Activity Tolerance Patient tolerated treatment well    Behavior During Therapy [facility] for tasks assessed/performed             Past Medical History:  Diagnosis Date   Abdominal pain, epigastric 09/18/2009   Anosmia 11/06/2010   ANXIETY 09/23/2006   BUNION, RIGHT FOOT 09/18/2009   CARPAL TUNNEL SYNDROME, BILATERAL 09/23/2006   DEPRESSION 09/23/2006   DIVERTICULOSIS, COLON 09/23/2006   GERD 05/24/2008   GLUCOSE INTOLERANCE 05/24/2008   Hemorrhoids    Hypercalcemia    HYPERLIPIDEMIA 09/23/2006   HYPERTENSION 09/23/2006   IBS 09/23/2006   Impaired glucose tolerance 11/04/2010   OSTEOPOROSIS 05/24/2008   06/01/19-pt states osteopenia   Parkinson's disease    Restrictive lung disease 12/20/2013   Sarcoidosis 02/26/2019   Past Surgical History:  Procedure Laterality Date   ABDOMINAL EXPOSURE N/A 10/29/2021   Procedure: LATERAL EXPOSURE FOR OBLIQUE SPINE SURGERY;  Surgeon: Marty Heck, MD;  Location: [facility];  Service: Vascular;  Laterality: N/A;   CARDIAC CATHETERIZATION     CATARACT EXTRACTION     COLONOSCOPY  2008   COLONOSCOPY W/ BIOPSIES  2008   Dr. Collene Mares -negative random bxs   ESOPHAGOGASTRODUODENOSCOPY  2008   Dr. Harriette Bouillon duodenal ulcer   FOOT SURGERY Bilateral 2015   LEFT AND RIGHT HEART CATHETERIZATION WITH CORONARY ANGIOGRAM N/A 11/09/2013    Procedure: LEFT AND RIGHT HEART CATHETERIZATION WITH CORONARY ANGIOGRAM;  Surgeon: Peter M Martinique, MD;  Location: [facility] CATH LAB;  Service: Cardiovascular;  Laterality: N/A;   nasal septum repair     OBLIQUE LUMBAR INTERBODY FUSION 1 LEVEL WITH PERCUTANEOUS SCREWS N/A 10/29/2021   Procedure: OBLIQUE LUMBAR INTERBODY FUSION WITH SUPPLEMENTAL PEDICLE SCREW FIXATION LUMBAR FOUR TO FIVE;  Surgeon: Melina Schools, MD;  Location: [place];  Service: Orthopedics;  Laterality: N/A;   s/p bilat CTS     s/p fibroid ablation     s/p ganglion cyst right wrist     s/p right thumb trigger finger surgury     SHOULDER SURGERY Right 2015   TUBAL LIGATION     UPPER GASTROINTESTINAL ENDOSCOPY     Patient Active Problem List   Diagnosis Date Noted   S/P lumbar fusion 10/29/2021   Small fiber neuropathy 11/20/2020   Lumbar radiculopathy 06/28/2020   Chronic neck pain 06/20/2020   Degenerative spondylolisthesis 06/20/2020   High risk medication use 04/27/2020   Panuveitis of both eyes 04/27/2020   Parkinson's disease 09/19/2019   Optic nerve edema 07/06/2019   Peripheral focal chorioretinal inflammation of both eyes 07/06/2019   Retinal edema 07/06/2019   Therapeutic drug monitoring 05/26/2019   Visual disturbance 04/27/2019   Sarcoidosis 02/26/2019   Hypercalcemia 02/04/2019   Generalized weakness 02/04/2019   Left-sided vestibular weakness 07/24/2017   Moderate episode  of recurrent major depressive disorder ([place]) 05/28/2017   RBD (REM behavioral disorder) 10/16/2016   Chronic depression 10/16/2016   Fatigue 10/16/2016   Vertigo 03/12/2016   Pulmonary air trapping 02/24/2015   DOE (dyspnea on exertion) 11/18/2013   Hot flashes 11/18/2013   Memory impairment 08/04/2013   Orthostasis 02/26/2013   Tachycardia 02/24/2013   Coronary artery calcification seen on CAT scan 01/01/2013   Tendonitis, calcific, shoulder 01/01/2013   OSA (obstructive sleep apnea) 01/01/2013   PD (Parkinson's disease) ([place])  11/26/2011   Hematochezia 11/09/2011   Colon polyps 11/09/2011   IBS (irritable bowel syndrome) 11/09/2011   Anosmia 11/06/2010   Impaired glucose tolerance 11/04/2010   Hearing loss 05/07/2010   Preventative health care 05/07/2010   BUNION, RIGHT FOOT 09/18/2009   GERD 05/24/2008   OSTEOPOROSIS 05/24/2008   Hyperlipidemia 09/23/2006   Anxiety state 09/23/2006   Depression 09/23/2006   CARPAL TUNNEL SYNDROME, BILATERAL 09/23/2006   Essential hypertension 09/23/2006   DIVERTICULOSIS, COLON 09/23/2006   Carpal tunnel syndrome, bilateral 09/23/2006    PCP: Dr Leeroy Cha REFERRING PROVIDER: Danie Binder, FNP  REFERRING DIAG: R42 (ICD-10-CM) - Dizziness and giddiness R26.89 (ICD-10-CM) - Other abnormalities of gait and mobility  THERAPY DIAG:  Dizziness and giddiness  Unsteadiness on feet  Abnormality of gait and mobility  ONSET DATE: 12/27/2021 (referral date)  Rationale for Evaluation and Treatment: Rehabilitation  SUBJECTIVE:   SUBJECTIVE STATEMENT: Patient arrives to clinic stating that she is doing alright. Patient reports that she feels like the room is moving and she loses her balance easily. Reports many near falls since we last saw her. Patient reports that she tends to feel most symptomatic in morning/evening. She also reports that when she experiences her symptoms she feels like she can't keep her feet on the ground as her toes pull back. Patient no longer wearing the back brace per surgeon instruction. Patient also reports that she does not have her next neurology appointment until August, which will be a 14 month lapse. Patient reports that she also has never formally had a DAT scan as well.  Pt accompanied by: significant other - husband [name]  PERTINENT HISTORY: Sarcodosis, small fiber neuropathy, lumbar fusion on October 2, Parkinson's Disease diangosed in 2018; see medical chart for additional details  PAIN:  Are you having pain? Yes -  feet 7-8/10 and low back pain 5/10  PRECAUTIONS: Back and Fall  WEIGHT BEARING RESTRICTIONS: No  FALLS: Has patient fallen in last 6 months? Yes. Number of falls 1 - denies any injuries with falls but had mistep into sunken living room due to dizziness  Previous falls a few years ago when she cut her head.   LIVING ENVIRONMENT: Lives with: lives with their family Lives in: House/apartment Stairs: 2 steps into the living room, 4 steps into house with a handrail Has following equipment at home: Single point cane and Walker - 2 wheeled  PLOF: Independent  PATIENT GOALS: "To get rid or improve this feeling of dizziness."  OBJECTIVE:   PATIENT SURVEYS:  FOTO 54 adjusted  VESTIBULAR ASSESSMENT:   POSITIONAL TESTING:  Right Dix-Hallpike: no nystagmus Left Dix-Hallpike: no nystagmus Right Roll Test: no nystagmus Left Roll Test: no nystagmus   MOTION SENSITIVITY:    Motion Sensitivity Quotient Intensity: 0 = none, 1 = Lightheaded, 2 = Mild, 3 = Moderate, 4 = Severe, 5 = Vomiting  Intensity  1. Sitting to supine 1  2. Supine to L side 4  3. Supine to  R side 2  4. Supine to sitting 2  5. L Hallpike-Dix 2  6. Up from L  2  7. R Hallpike-Dix 3  8. Up from R  3  9. Sitting, head  tipped to L knee Held due to spinal precautions  10. Head up from L  knee Held due to spinal precautions  11. Sitting, head  tipped to R knee Held due to spinal precautions  12. Head up from R  knee Held due to spinal precautions  13. Sitting head turns x5 4  14.Sitting head nods x5 3  15. In stance, 180  turn to L  2  16. In stance, 180  turn to R 3   Score: 31/60 = 51% impairment    Brief torsional nystagmus noted when patient was in L sidelying with latency of <2 seconds and duration of ~4 seconds. Torsional nystagnus for only about 2 beats noted when went from supine lying to sitting upright.   VESTIBULAR TREATMENT:                                                                                                     NMR:  MSQ and positional testing performed as noted above. Additional assist provided by therapist during all positional testing to accommodate lumbar considerations.    PATIENT EDUCATION: Education details: Results/findings, home safety and fall risk precautions Person educated: Patient and Spouse Education method: Explanation Education comprehension: verbalized understanding  HOME EXERCISE PROGRAM: Rolling    With pillow under head, start on back. Roll slowly to right. Hold position until symptoms subside. Roll slowly onto left side. Hold position until symptoms subside. Repeat sequence 10 times per session. Do 2 sessions per day.  Copyright  VHI. All rights reserved.   GOALS: Goals reviewed with patient? Yes  SHORT TERM GOALS: Target date: 02/05/2022  Patient will demonstrate 100% compliance with initial HEP to continue to progress between physical therapy sessions.   Baseline: To be provided Goal status: INITIAL  2.  Patient will improve FOTO score by 5 points to demonstrate improved function and mobility impacted by dizziness. Baseline: 33 Goal status: INITIAL  3.  Therapist will assess positional testing to assess for BPPV with goal to be added if appropriate in order to progress treatment plan moving forward. Baseline: BPPV Assessed, negative, and no additional goal required Goal status: MET  LONG TERM GOALS: Target date: 02/26/2022   Patient will report demonstrate independence with final HEP in order to maintain current gains and continue to progress after physical therapy discharge.   Baseline: To be provided Goal status: INITIAL  2.  Patient will improve FOTO score to 47 points to demonstrate improved function and mobility impacted by dizziness. Baseline: 33 Goal status: INITIAL  3.  Patient will improve MSQ score to 40% or less to indicate a reduction in motion sensitivity.  Baseline: Reports dizziness with all movements Goal  status: REVISED  ASSESSMENT:  CLINICAL IMPRESSION: Session today emphasized additional vestibular assessment. Patient was negative for all positional testing and torsional nystagmus noted during session most consistent with possible  central impairments. Patient presented increased motion sensitivity with all components completed on MSQ, with rolling L > R and horizontal head turns causing greatest symptoms. Patient presents with moderate motion sensitivity. Patient was provided rolling for habituation as initial HEP. Patient has already experienced one fall as a result of symptoms and multiple near falls and will benefit from skilled physical therapy to address impairments.  OBJECTIVE IMPAIRMENTS: Abnormal gait, decreased balance, decreased mobility, difficulty walking, dizziness, and impaired vision/preception.   ACTIVITY LIMITATIONS: bending, sitting, standing, stairs, and transfers  PARTICIPATION LIMITATIONS: interpersonal relationship, driving, and community activity  PERSONAL FACTORS: Age, Transportation, and 3+ comorbidities: see above for details including PD, sclerosis, and  are also affecting patient's functional outcome.   REHAB POTENTIAL: Good  CLINICAL DECISION MAKING: Evolving/moderate complexity  EVALUATION COMPLEXITY: Moderate   PLAN:  PT FREQUENCY: 2x/week  PT DURATION: 6 weeks  PLANNED INTERVENTIONS: Therapeutic exercises, Therapeutic activity, Neuromuscular re-education, Balance training, Gait training, Patient/Family education, Self Care, Vestibular training, and Canalith repositioning  PLAN FOR NEXT SESSION: review HEP, VOR x 1 viewing, MSQ components, habituation  Malachi Carl, PT, DPT 01/25/2022, 3:20 PM

## 2022-01-29 ENCOUNTER — Ambulatory Visit: Payer: Medicare Other | Attending: Neurology | Admitting: Physical Therapy

## 2022-01-29 ENCOUNTER — Encounter: Payer: Self-pay | Admitting: Physical Therapy

## 2022-01-29 VITALS — BP 115/86 | HR 91

## 2022-01-29 DIAGNOSIS — R42 Dizziness and giddiness: Secondary | ICD-10-CM | POA: Insufficient documentation

## 2022-01-29 DIAGNOSIS — R269 Unspecified abnormalities of gait and mobility: Secondary | ICD-10-CM | POA: Diagnosis not present

## 2022-01-29 DIAGNOSIS — R2681 Unsteadiness on feet: Secondary | ICD-10-CM | POA: Diagnosis not present

## 2022-01-29 NOTE — Therapy (Signed)
OUTPATIENT PHYSICAL THERAPY VESTIBULAR TREATMENT  Patient Name: Destiny Roth MRN: [id number] DOB:[birth year], 72 y.o., female Today's Date: 01/29/2022  END OF SESSION:  PT End of Session - 01/29/22 1229     Visit Number 3    Number of Visits 13    Date for PT Re-Evaluation 02/26/22    Authorization Type Medicare Part A & B    PT Start Time 1225    PT Stop Time 1310    PT Time Calculation (min) 45 min    Equipment Utilized During Treatment Other (comment)    Activity Tolerance Patient tolerated treatment well    Behavior During Therapy WFL for tasks assessed/performed             Past Medical History:  Diagnosis Date   Abdominal pain, epigastric 09/18/2009   Anosmia 11/06/2010   ANXIETY 09/23/2006   BUNION, RIGHT FOOT 09/18/2009   CARPAL TUNNEL SYNDROME, BILATERAL 09/23/2006   DEPRESSION 09/23/2006   DIVERTICULOSIS, COLON 09/23/2006   GERD 05/24/2008   GLUCOSE INTOLERANCE 05/24/2008   Hemorrhoids    Hypercalcemia    HYPERLIPIDEMIA 09/23/2006   HYPERTENSION 09/23/2006   IBS 09/23/2006   Impaired glucose tolerance 11/04/2010   OSTEOPOROSIS 05/24/2008   06/01/19-pt states osteopenia   Parkinson's disease    Restrictive lung disease 12/20/2013   Sarcoidosis 02/26/2019   Past Surgical History:  Procedure Laterality Date   ABDOMINAL EXPOSURE N/A 10/29/2021   Procedure: LATERAL EXPOSURE FOR OBLIQUE SPINE SURGERY;  Surgeon: Marty Heck, MD;  Location: [place];  Service: Vascular;  Laterality: N/A;   CARDIAC CATHETERIZATION     CATARACT EXTRACTION     COLONOSCOPY  2008   COLONOSCOPY W/ BIOPSIES  2008   Dr. Collene Mares -negative random bxs   ESOPHAGOGASTRODUODENOSCOPY  2008   Dr. Harriette Bouillon duodenal ulcer   FOOT SURGERY Bilateral 2015   LEFT AND RIGHT HEART CATHETERIZATION WITH CORONARY ANGIOGRAM N/A 11/09/2013   Procedure: LEFT AND RIGHT HEART CATHETERIZATION WITH CORONARY ANGIOGRAM;  Surgeon: Peter M Martinique, MD;  Location: [facility] CATH LAB;  Service: Cardiovascular;  Laterality:  N/A;   nasal septum repair     OBLIQUE LUMBAR INTERBODY FUSION 1 LEVEL WITH PERCUTANEOUS SCREWS N/A 10/29/2021   Procedure: OBLIQUE LUMBAR INTERBODY FUSION WITH SUPPLEMENTAL PEDICLE SCREW FIXATION LUMBAR FOUR TO FIVE;  Surgeon: Melina Schools, MD;  Location: [place];  Service: Orthopedics;  Laterality: N/A;   s/p bilat CTS     s/p fibroid ablation     s/p ganglion cyst right wrist     s/p right thumb trigger finger surgury     SHOULDER SURGERY Right 2015   TUBAL LIGATION     UPPER GASTROINTESTINAL ENDOSCOPY     Patient Active Problem List   Diagnosis Date Noted   S/P lumbar fusion 10/29/2021   Small fiber neuropathy 11/20/2020   Lumbar radiculopathy 06/28/2020   Chronic neck pain 06/20/2020   Degenerative spondylolisthesis 06/20/2020   High risk medication use 04/27/2020   Panuveitis of both eyes 04/27/2020   Parkinson's disease 09/19/2019   Optic nerve edema 07/06/2019   Peripheral focal chorioretinal inflammation of both eyes 07/06/2019   Retinal edema 07/06/2019   Therapeutic drug monitoring 05/26/2019   Visual disturbance 04/27/2019   Sarcoidosis 02/26/2019   Hypercalcemia 02/04/2019   Generalized weakness 02/04/2019   Left-sided vestibular weakness 07/24/2017   Moderate episode of recurrent major depressive disorder ([place]) 05/28/2017   RBD (REM behavioral disorder) 10/16/2016   Chronic depression 10/16/2016   Fatigue 10/16/2016   Vertigo 03/12/2016  Pulmonary air trapping 02/24/2015   DOE (dyspnea on exertion) 11/18/2013   Hot flashes 11/18/2013   Memory impairment 08/04/2013   Orthostasis 02/26/2013   Tachycardia 02/24/2013   Coronary artery calcification seen on CAT scan 01/01/2013   Tendonitis, calcific, shoulder 01/01/2013   OSA (obstructive sleep apnea) 01/01/2013   PD (Parkinson's disease) ([place]) 11/26/2011   Hematochezia 11/09/2011   Colon polyps 11/09/2011   IBS (irritable bowel syndrome) 11/09/2011   Anosmia 11/06/2010   Impaired glucose tolerance  11/04/2010   Hearing loss 05/07/2010   Preventative health care 05/07/2010   BUNION, RIGHT FOOT 09/18/2009   GERD 05/24/2008   OSTEOPOROSIS 05/24/2008   Hyperlipidemia 09/23/2006   Anxiety state 09/23/2006   Depression 09/23/2006   CARPAL TUNNEL SYNDROME, BILATERAL 09/23/2006   Essential hypertension 09/23/2006   DIVERTICULOSIS, COLON 09/23/2006   Carpal tunnel syndrome, bilateral 09/23/2006    PCP: Dr Leeroy Cha REFERRING PROVIDER: Danie Binder, FNP  REFERRING DIAG: R42 (ICD-10-CM) - Dizziness and giddiness R26.89 (ICD-10-CM) - Other abnormalities of gait and mobility  THERAPY DIAG:  Dizziness and giddiness  Unsteadiness on feet  Abnormality of gait and mobility  ONSET DATE: 12/27/2021 (referral date)  Rationale for Evaluation and Treatment: Rehabilitation  SUBJECTIVE:   SUBJECTIVE STATEMENT: Patient arrives to clinic stating that she is doing well. Tried her initial exercises at home and reported no symptoms but was only turning head in bed instead of rolling. Verbalized understanding of how to perform exercise moving forward in future. Patient reports that her dizziness has been slightly elevated throughout the day with symptoms 5/10 at start of session.  Pt accompanied by: significant other - husband [name]  PERTINENT HISTORY: Sarcodosis, small fiber neuropathy, lumbar fusion on October 2, Parkinson's Disease diangosed in 2018; see medical chart for additional details  PAIN:  Are you having pain? Yes - mild low back pain  PRECAUTIONS: Back and Fall  WEIGHT BEARING RESTRICTIONS: No  FALLS: Has patient fallen in last 6 months? Yes. Number of falls 1 - denies any injuries with falls but had mistep into sunken living room due to dizziness  Previous falls a few years ago when she cut her head.   LIVING ENVIRONMENT: Lives with: lives with their family Lives in: House/apartment Stairs: 2 steps into the living room, 4 steps into house with a  handrail Has following equipment at home: Single point cane and Walker - 2 wheeled  PLOF: Independent  PATIENT GOALS: "To get rid or improve this feeling of dizziness."  OBJECTIVE:    Vitals:   01/29/22 1238  BP: 115/86  Pulse: 91    VESTIBULAR TREATMENT:                                                                                                    NMR:  Rolling on mat side to side with tips for LE management to improve ease with rolling; performed for habituation x 10  All remaining exercises performed seated with pillow behind back for lumbar support VOR x 1 viewing 2 x 60" on plain background (horizontal and vertical) Scanning head turns  for habituation x 6   HART Chart:  Head still vertical/horizontal eye movements on plain background x 1 Vertical head movements on plain background x 1  PATIENT EDUCATION: Education details: HEP Person educated: Patient and Spouse Education method: Explanation Education comprehension: verbalized understanding  HOME EXERCISE PROGRAM: Rolling    With pillow under head, start on back. Roll slowly to right. Hold position until symptoms subside. Roll slowly onto left side. Hold position until symptoms subside. Repeat sequence 10 times per session. Do 2 sessions per day.  VOR x 1 viewing 1 x 3x day for 60" (horizontal and vertical - printout from [employer] website)  Copyright  VHI. All rights reserved.   GOALS: Goals reviewed with patient? Yes  SHORT TERM GOALS: Target date: 02/05/2022  Patient will demonstrate 100% compliance with initial HEP to continue to progress between physical therapy sessions.   Baseline: To be provided Goal status: INITIAL  2.  Patient will improve FOTO score by 5 points to demonstrate improved function and mobility impacted by dizziness. Baseline: 33 Goal status: INITIAL  3.  Therapist will assess positional testing to assess for BPPV with goal to be added if appropriate in order to progress  treatment plan moving forward. Baseline: BPPV Assessed, negative, and no additional goal required Goal status: MET  LONG TERM GOALS: Target date: 02/26/2022   Patient will report demonstrate independence with final HEP in order to maintain current gains and continue to progress after physical therapy discharge.   Baseline: To be provided Goal status: INITIAL  2.  Patient will improve FOTO score to 47 points to demonstrate improved function and mobility impacted by dizziness. Baseline: 33 Goal status: INITIAL  3.  Patient will improve MSQ score to 40% or less to indicate a reduction in motion sensitivity.  Baseline: Reports dizziness with all movements Goal status: REVISED  ASSESSMENT:  CLINICAL IMPRESSION: Session emphasized continued habituation of components on MSQ and introduction towards VOR x 1 viewing. Patient reported an increase in symptoms from 5/10 to 7/10 with horizontal VOR that improved after 2 minutes rest. Patient required cues for amplitude and fluidity of head turns. Reported no increase in symptoms with [facility] Chart and only mild increase in dizziness with rolling. Patient has already experienced one fall as a result of symptoms and multiple near falls and will benefit from skilled physical therapy to address impairments.  OBJECTIVE IMPAIRMENTS: Abnormal gait, decreased balance, decreased mobility, difficulty walking, dizziness, and impaired vision/preception.   ACTIVITY LIMITATIONS: bending, sitting, standing, stairs, and transfers  PARTICIPATION LIMITATIONS: interpersonal relationship, driving, and community activity  PERSONAL FACTORS: Age, Transportation, and 3+ comorbidities: see above for details including PD, sclerosis, and  are also affecting patient's functional outcome.   REHAB POTENTIAL: Good  CLINICAL DECISION MAKING: Evolving/moderate complexity  EVALUATION COMPLEXITY: Moderate   PLAN:  PT FREQUENCY: 2x/week  PT DURATION: 6 weeks  PLANNED  INTERVENTIONS: Therapeutic exercises, Therapeutic activity, Neuromuscular re-education, Balance training, Gait training, Patient/Family education, Self Care, Vestibular training, and Canalith repositioning  PLAN FOR NEXT SESSION: review HEP, VOR x 1 viewing, MSQ components, habituation; horizontal head movements; progress to standing balance tasks with vestibular challenges as tolerated  Malachi Carl, PT, DPT 01/29/2022, 1:36 PM

## 2022-01-31 DIAGNOSIS — R41841 Cognitive communication deficit: Secondary | ICD-10-CM | POA: Diagnosis not present

## 2022-01-31 DIAGNOSIS — D869 Sarcoidosis, unspecified: Secondary | ICD-10-CM | POA: Diagnosis not present

## 2022-01-31 DIAGNOSIS — G20A1 Parkinson's disease without dyskinesia, without mention of fluctuations: Secondary | ICD-10-CM | POA: Diagnosis not present

## 2022-02-01 ENCOUNTER — Encounter: Payer: Self-pay | Admitting: Physical Therapy

## 2022-02-01 ENCOUNTER — Ambulatory Visit: Payer: Medicare Other | Admitting: Physical Therapy

## 2022-02-01 DIAGNOSIS — R269 Unspecified abnormalities of gait and mobility: Secondary | ICD-10-CM | POA: Diagnosis not present

## 2022-02-01 DIAGNOSIS — R2681 Unsteadiness on feet: Secondary | ICD-10-CM

## 2022-02-01 DIAGNOSIS — D869 Sarcoidosis, unspecified: Secondary | ICD-10-CM | POA: Diagnosis not present

## 2022-02-01 DIAGNOSIS — R2689 Other abnormalities of gait and mobility: Secondary | ICD-10-CM | POA: Diagnosis not present

## 2022-02-01 DIAGNOSIS — M5451 Vertebrogenic low back pain: Secondary | ICD-10-CM | POA: Diagnosis not present

## 2022-02-01 DIAGNOSIS — R42 Dizziness and giddiness: Secondary | ICD-10-CM | POA: Diagnosis not present

## 2022-02-01 NOTE — Therapy (Signed)
OUTPATIENT PHYSICAL THERAPY VESTIBULAR TREATMENT  Patient Name: Destiny Roth MRN: [id number] DOB:1950/02/25, 72 y.o., female Today's Date: 02/01/2022  END OF SESSION:  PT End of Session - 02/01/22 1235     Visit Number 4    Number of Visits 13    Date for PT Re-Evaluation 02/26/22    Authorization Type Medicare Part A & B    PT Start Time 1233    PT Stop Time 1313    PT Time Calculation (min) 40 min    Equipment Utilized During Treatment Gait belt    Activity Tolerance Patient tolerated treatment well    Behavior During Therapy WFL for tasks assessed/performed              Past Medical History:  Diagnosis Date   Abdominal pain, epigastric 09/18/2009   Anosmia 11/06/2010   ANXIETY 09/23/2006   BUNION, RIGHT FOOT 09/18/2009   CARPAL TUNNEL SYNDROME, BILATERAL 09/23/2006   DEPRESSION 09/23/2006   DIVERTICULOSIS, COLON 09/23/2006   GERD 05/24/2008   GLUCOSE INTOLERANCE 05/24/2008   Hemorrhoids    Hypercalcemia    HYPERLIPIDEMIA 09/23/2006   HYPERTENSION 09/23/2006   IBS 09/23/2006   Impaired glucose tolerance 11/04/2010   OSTEOPOROSIS 05/24/2008   06/01/19-pt states osteopenia   Parkinson's disease    Restrictive lung disease 12/20/2013   Sarcoidosis 02/26/2019   Past Surgical History:  Procedure Laterality Date   ABDOMINAL EXPOSURE N/A 10/29/2021   Procedure: LATERAL EXPOSURE FOR OBLIQUE SPINE SURGERY;  Surgeon: Marty Heck, MD;  Location: [place];  Service: Vascular;  Laterality: N/A;   CARDIAC CATHETERIZATION     CATARACT EXTRACTION     COLONOSCOPY  2008   COLONOSCOPY W/ BIOPSIES  2008   Dr. Collene Mares -negative random bxs   ESOPHAGOGASTRODUODENOSCOPY  2008   Dr. Harriette Bouillon duodenal ulcer   FOOT SURGERY Bilateral 2015   LEFT AND RIGHT HEART CATHETERIZATION WITH CORONARY ANGIOGRAM N/A 11/09/2013   Procedure: LEFT AND RIGHT HEART CATHETERIZATION WITH CORONARY ANGIOGRAM;  Surgeon: Peter M Martinique, MD;  Location: [facility] CATH LAB;  Service: Cardiovascular;  Laterality: N/A;    nasal septum repair     OBLIQUE LUMBAR INTERBODY FUSION 1 LEVEL WITH PERCUTANEOUS SCREWS N/A 10/29/2021   Procedure: OBLIQUE LUMBAR INTERBODY FUSION WITH SUPPLEMENTAL PEDICLE SCREW FIXATION LUMBAR FOUR TO FIVE;  Surgeon: Melina Schools, MD;  Location: [place];  Service: Orthopedics;  Laterality: N/A;   s/p bilat CTS     s/p fibroid ablation     s/p ganglion cyst right wrist     s/p right thumb trigger finger surgury     SHOULDER SURGERY Right 2015   TUBAL LIGATION     UPPER GASTROINTESTINAL ENDOSCOPY     Patient Active Problem List   Diagnosis Date Noted   S/P lumbar fusion 10/29/2021   Small fiber neuropathy 11/20/2020   Lumbar radiculopathy 06/28/2020   Chronic neck pain 06/20/2020   Degenerative spondylolisthesis 06/20/2020   High risk medication use 04/27/2020   Panuveitis of both eyes 04/27/2020   Parkinson's disease 09/19/2019   Optic nerve edema 07/06/2019   Peripheral focal chorioretinal inflammation of both eyes 07/06/2019   Retinal edema 07/06/2019   Therapeutic drug monitoring 05/26/2019   Visual disturbance 04/27/2019   Sarcoidosis 02/26/2019   Hypercalcemia 02/04/2019   Generalized weakness 02/04/2019   Left-sided vestibular weakness 07/24/2017   Moderate episode of recurrent major depressive disorder ([place]) 05/28/2017   RBD (REM behavioral disorder) 10/16/2016   Chronic depression 10/16/2016   Fatigue 10/16/2016   Vertigo  03/12/2016   Pulmonary air trapping 02/24/2015   DOE (dyspnea on exertion) 11/18/2013   Hot flashes 11/18/2013   Memory impairment 08/04/2013   Orthostasis 02/26/2013   Tachycardia 02/24/2013   Coronary artery calcification seen on CAT scan 01/01/2013   Tendonitis, calcific, shoulder 01/01/2013   OSA (obstructive sleep apnea) 01/01/2013   PD (Parkinson's disease) ([place]) 11/26/2011   Hematochezia 11/09/2011   Colon polyps 11/09/2011   IBS (irritable bowel syndrome) 11/09/2011   Anosmia 11/06/2010   Impaired glucose tolerance 11/04/2010    Hearing loss 05/07/2010   Preventative health care 05/07/2010   BUNION, RIGHT FOOT 09/18/2009   GERD 05/24/2008   OSTEOPOROSIS 05/24/2008   Hyperlipidemia 09/23/2006   Anxiety state 09/23/2006   Depression 09/23/2006   CARPAL TUNNEL SYNDROME, BILATERAL 09/23/2006   Essential hypertension 09/23/2006   DIVERTICULOSIS, COLON 09/23/2006   Carpal tunnel syndrome, bilateral 09/23/2006    PCP: Dr Leeroy Cha REFERRING PROVIDER: Danie Binder, FNP  REFERRING DIAG: R42 (ICD-10-CM) - Dizziness and giddiness R26.89 (ICD-10-CM) - Other abnormalities of gait and mobility  THERAPY DIAG:  Dizziness and giddiness  Unsteadiness on feet  Abnormality of gait and mobility  ONSET DATE: 12/27/2021 (referral date)  Rationale for Evaluation and Treatment: Rehabilitation  SUBJECTIVE:   SUBJECTIVE STATEMENT: No falls. Has been doing the rolling at home, and reports no longer feeling dizzy with this anymore.  Pt's reporting baseline dizziness at 3-4/10. Pt describing dizziness as like swaying in water.   Pt accompanied by: significant other - husband [name]  PERTINENT HISTORY: Sarcodosis, small fiber neuropathy, lumbar fusion on October 2, Parkinson's Disease diangosed in 2018; see medical chart for additional details  PAIN:  Are you having pain? Yes - in feet. Has neuropathy.   PRECAUTIONS: Back and Fall  WEIGHT BEARING RESTRICTIONS: No  FALLS: Has patient fallen in last 6 months? Yes. Number of falls 1 - denies any injuries with falls but had mistep into sunken living room due to dizziness  Previous falls a few years ago when she cut her head.   LIVING ENVIRONMENT: Lives with: lives with their family Lives in: House/apartment Stairs: 2 steps into the living room, 4 steps into house with a handrail Has following equipment at home: Single point cane and Walker - 2 wheeled  PLOF: Independent  PATIENT GOALS: "To get rid or improve this feeling of  dizziness."  OBJECTIVE:    There were no vitals filed for this visit.   VESTIBULAR TREATMENT:                                                                                                   Pt ambulating in and out of session with HHA from husband. Asked about trialing a rollator for improved stability/independence with gait. Pt declined at this time   With seated exercises, pt in chair with pillow behind her for lumbar support due to recent surgery.   NMR: Gaze Adaptation: x1 Viewing Horizontal: Position: Seated with pillow behind back, Time: 60 seconds, Reps: 2, and Comment: Cues to slightly incr head motion speed. Pt reporting same baseline dizziness. and  x1 Viewing Vertical:  Position: Seated with pillow behind back,, Time: 60 seconds, Reps: 2, and Comment: No incr in symptoms. Pt reporting feeling better with vertical head movements.   Reviewed from HEP as pt as not had the chance to try at home.   Standing Balance: Surface: Floor Position: Narrow Base of Support Completed with: EO 2 x 30 seconds, mild postural sway   Feet together EO: 2 sets of 10 reps head turns, 2 sets of 10 reps head nods. Cues to just turn head vs. Whole body   Feet hip width EC: 3 x 30 seconds, mild postural sway, pt able to utilize ankle strategy to maintain balance.   Single Leg Stance:   Surface: Floor  Lower Extremity: RLE and LLE  Time: 3 x 10 seconds on RLE (pt able to hold without UE support), 3 x 10 seconds on LLE (pt needing intermittent taps to chair for balance.     Pt performing sit <> stands during session with more narrow BOS and pt reporting feeling more off balance in standing, educated to have feet apart for improved balance once in standing.   PATIENT EDUCATION: Education details: Standing balance additions to HEP (see [employer] additions below). Education on 3 balance systems, purpose of habituation exercises.  Person educated: Patient and Spouse Education method: Explanation,  Demonstration, and Handouts Education comprehension: verbalized understanding and returned demonstration  HOME EXERCISE PROGRAM: Rolling    With pillow under head, start on back. Roll slowly to right. Hold position until symptoms subside. Roll slowly onto left side. Hold position until symptoms subside. Repeat sequence 10 times per session. Do 2 sessions per day.  VOR x 1 viewing 1 x 3x day for 60" (horizontal and vertical - printout from [employer] website  Access Code: [email or web address] URL: [email or web address] Date: 02/01/2022 Prepared by: [place] with Eyes Closed  - 1 x daily - 5 x weekly - 3 sets - 30 hold - Romberg Stance with Head Rotation  - 1 x daily - 5 x weekly - 2 sets - 10 reps and head nods  - Standing Single Leg Stance with Counter Support  - 1 x daily - 5 x weekly - 3 sets - 10 hold  GOALS: Goals reviewed with patient? Yes  SHORT TERM GOALS: Target date: 02/05/2022  Patient will demonstrate 100% compliance with initial HEP to continue to progress between physical therapy sessions.   Baseline: To be provided Goal status: INITIAL  2.  Patient will improve FOTO score by 5 points to demonstrate improved function and mobility impacted by dizziness. Baseline: 33 Goal status: INITIAL  3.  Therapist will assess positional testing to assess for BPPV with goal to be added if appropriate in order to progress treatment plan moving forward. Baseline: BPPV Assessed, negative, and no additional goal required Goal status: MET  LONG TERM GOALS: Target date: 02/26/2022   Patient will report demonstrate independence with final HEP in order to maintain current gains and continue to progress after physical therapy discharge.   Baseline: To be provided Goal status: INITIAL  2.  Patient will improve FOTO score to 47 points to demonstrate improved function and mobility impacted by dizziness. Baseline: 33 Goal status: INITIAL  3.   Patient will improve MSQ score to 40% or less to indicate a reduction in motion sensitivity.  Baseline: Reports dizziness with all movements Goal status: REVISED  ASSESSMENT:  CLINICAL IMPRESSION:  Pt reporting baseline dizziness today as 3-4/10 with pt  reporting dizziness as more of a swaying in water feeling. Pt having no incr in dizziness symptoms with exercises today. Reviewed technique for seated VOR x1 exercises as pt had not had the chance to try at home. Pt with no incr in symptoms, even when slightly improving speed of head movement (improvement from previous session). Remainder of session focused on standing balance with narrow BOS, SLS, and vision removed. Even on level ground with EC, pt with mild postural sway, but pt able to maintain balance with ankle strategy. Added standing corner balance to HEP. Pt tolerated session well, will continue per POC.   OBJECTIVE IMPAIRMENTS: Abnormal gait, decreased balance, decreased mobility, difficulty walking, dizziness, and impaired vision/preception.   ACTIVITY LIMITATIONS: bending, sitting, standing, stairs, and transfers  PARTICIPATION LIMITATIONS: interpersonal relationship, driving, and community activity  PERSONAL FACTORS: Age, Transportation, and 3+ comorbidities: see above for details including PD, sclerosis, and  are also affecting patient's functional outcome.   REHAB POTENTIAL: Good  CLINICAL DECISION MAKING: Evolving/moderate complexity  EVALUATION COMPLEXITY: Moderate   PLAN:  PT FREQUENCY: 2x/week  PT DURATION: 6 weeks  PLANNED INTERVENTIONS: Therapeutic exercises, Therapeutic activity, Neuromuscular re-education, Balance training, Gait training, Patient/Family education, Self Care, Vestibular training, and Canalith repositioning  PLAN FOR NEXT SESSION: add to HEP as needed. STGs due this week. Progress VOR x 1 viewing, MSQ components, habituation; horizontal head movements; standing balance tasks on unlevel surfaces,  EC, narrow BOS and trying to decr UE support   Janann August, PT, DPT 02/01/22 1:27 PM

## 2022-02-04 ENCOUNTER — Other Ambulatory Visit (INDEPENDENT_AMBULATORY_CARE_PROVIDER_SITE_OTHER): Payer: Medicare Other

## 2022-02-04 ENCOUNTER — Ambulatory Visit: Payer: Medicare Other | Admitting: Physical Therapy

## 2022-02-04 ENCOUNTER — Encounter: Payer: Self-pay | Admitting: Physical Therapy

## 2022-02-04 DIAGNOSIS — R2681 Unsteadiness on feet: Secondary | ICD-10-CM | POA: Diagnosis not present

## 2022-02-04 DIAGNOSIS — R269 Unspecified abnormalities of gait and mobility: Secondary | ICD-10-CM

## 2022-02-04 DIAGNOSIS — D869 Sarcoidosis, unspecified: Secondary | ICD-10-CM | POA: Diagnosis not present

## 2022-02-04 DIAGNOSIS — M5451 Vertebrogenic low back pain: Secondary | ICD-10-CM | POA: Diagnosis not present

## 2022-02-04 DIAGNOSIS — R2689 Other abnormalities of gait and mobility: Secondary | ICD-10-CM | POA: Diagnosis not present

## 2022-02-04 DIAGNOSIS — R42 Dizziness and giddiness: Secondary | ICD-10-CM

## 2022-02-04 LAB — CBC WITH DIFFERENTIAL/PLATELET
Basophils Absolute: 0 10*3/uL (ref 0.0–0.1)
Basophils Relative: 0.6 % (ref 0.0–3.0)
Eosinophils Absolute: 0.1 10*3/uL (ref 0.0–0.7)
Eosinophils Relative: 1.2 % (ref 0.0–5.0)
HCT: 41.1 % (ref 36.0–46.0)
Hemoglobin: 13.3 g/dL (ref 12.0–15.0)
Lymphocytes Relative: 16.6 % (ref 12.0–46.0)
Lymphs Abs: 1.2 10*3/uL (ref 0.7–4.0)
MCHC: 32.4 g/dL (ref 30.0–36.0)
MCV: 93 fl (ref 78.0–100.0)
Monocytes Absolute: 0.6 10*3/uL (ref 0.1–1.0)
Monocytes Relative: 8 % (ref 3.0–12.0)
Neutro Abs: 5.1 10*3/uL (ref 1.4–7.7)
Neutrophils Relative %: 73.6 % (ref 43.0–77.0)
Platelets: 340 10*3/uL (ref 150.0–400.0)
RBC: 4.42 Mil/uL (ref 3.87–5.11)
RDW: 16.8 % — ABNORMAL HIGH (ref 11.5–15.5)
WBC: 7 10*3/uL (ref 4.0–10.5)

## 2022-02-04 LAB — COMPREHENSIVE METABOLIC PANEL
ALT: 7 U/L (ref 0–35)
AST: 19 U/L (ref 0–37)
Albumin: 4.1 g/dL (ref 3.5–5.2)
Alkaline Phosphatase: 76 U/L (ref 39–117)
BUN: 27 mg/dL — ABNORMAL HIGH (ref 6–23)
CO2: 27 mEq/L (ref 19–32)
Calcium: 10 mg/dL (ref 8.4–10.5)
Chloride: 102 mEq/L (ref 96–112)
Creatinine, Ser: 1.26 mg/dL — ABNORMAL HIGH (ref 0.40–1.20)
GFR: 42.4 mL/min — ABNORMAL LOW (ref >60.00)
Glucose, Bld: 108 mg/dL — ABNORMAL HIGH (ref 70–99)
Potassium: 4 mEq/L (ref 3.5–5.1)
Sodium: 139 mEq/L (ref 135–145)
Total Bilirubin: 0.3 mg/dL (ref 0.2–1.2)
Total Protein: 8 g/dL (ref 6.0–8.3)

## 2022-02-04 NOTE — Therapy (Signed)
OUTPATIENT PHYSICAL THERAPY VESTIBULAR TREATMENT  Patient Name: Destiny Roth MRN: [id number] DOB:[birth year], 72 y.o., female Today's Date: 02/04/2022  END OF SESSION:  PT End of Session - 02/04/22 1233     Visit Number 5    Number of Visits 13    Date for PT Re-Evaluation 02/26/22    Authorization Type Medicare Part A & B    PT Start Time 1230    PT Stop Time 1316    PT Time Calculation (min) 46 min    Equipment Utilized During Treatment Gait belt    Activity Tolerance Patient tolerated treatment well    Behavior During Therapy WFL for tasks assessed/performed             Past Medical History:  Diagnosis Date   Abdominal pain, epigastric 09/18/2009   Anosmia 11/06/2010   ANXIETY 09/23/2006   BUNION, RIGHT FOOT 09/18/2009   CARPAL TUNNEL SYNDROME, BILATERAL 09/23/2006   DEPRESSION 09/23/2006   DIVERTICULOSIS, COLON 09/23/2006   GERD 05/24/2008   GLUCOSE INTOLERANCE 05/24/2008   Hemorrhoids    Hypercalcemia    HYPERLIPIDEMIA 09/23/2006   HYPERTENSION 09/23/2006   IBS 09/23/2006   Impaired glucose tolerance 11/04/2010   OSTEOPOROSIS 05/24/2008   06/01/19-pt states osteopenia   Parkinson's disease    Restrictive lung disease 12/20/2013   Sarcoidosis 02/26/2019   Past Surgical History:  Procedure Laterality Date   ABDOMINAL EXPOSURE N/A 10/29/2021   Procedure: LATERAL EXPOSURE FOR OBLIQUE SPINE SURGERY;  Surgeon: Marty Heck, MD;  Location: [place];  Service: Vascular;  Laterality: N/A;   CARDIAC CATHETERIZATION     CATARACT EXTRACTION     COLONOSCOPY  2008   COLONOSCOPY W/ BIOPSIES  2008   Dr. Collene Mares -negative random bxs   ESOPHAGOGASTRODUODENOSCOPY  2008   Dr. Harriette Bouillon duodenal ulcer   FOOT SURGERY Bilateral 2015   LEFT AND RIGHT HEART CATHETERIZATION WITH CORONARY ANGIOGRAM N/A 11/09/2013   Procedure: LEFT AND RIGHT HEART CATHETERIZATION WITH CORONARY ANGIOGRAM;  Surgeon: Peter M Martinique, MD;  Location: [facility] CATH LAB;  Service: Cardiovascular;  Laterality: N/A;    nasal septum repair     OBLIQUE LUMBAR INTERBODY FUSION 1 LEVEL WITH PERCUTANEOUS SCREWS N/A 10/29/2021   Procedure: OBLIQUE LUMBAR INTERBODY FUSION WITH SUPPLEMENTAL PEDICLE SCREW FIXATION LUMBAR FOUR TO FIVE;  Surgeon: Melina Schools, MD;  Location: [place];  Service: Orthopedics;  Laterality: N/A;   s/p bilat CTS     s/p fibroid ablation     s/p ganglion cyst right wrist     s/p right thumb trigger finger surgury     SHOULDER SURGERY Right 2015   TUBAL LIGATION     UPPER GASTROINTESTINAL ENDOSCOPY     Patient Active Problem List   Diagnosis Date Noted   S/P lumbar fusion 10/29/2021   Small fiber neuropathy 11/20/2020   Lumbar radiculopathy 06/28/2020   Chronic neck pain 06/20/2020   Degenerative spondylolisthesis 06/20/2020   High risk medication use 04/27/2020   Panuveitis of both eyes 04/27/2020   Parkinson's disease 09/19/2019   Optic nerve edema 07/06/2019   Peripheral focal chorioretinal inflammation of both eyes 07/06/2019   Retinal edema 07/06/2019   Therapeutic drug monitoring 05/26/2019   Visual disturbance 04/27/2019   Sarcoidosis 02/26/2019   Hypercalcemia 02/04/2019   Generalized weakness 02/04/2019   Left-sided vestibular weakness 07/24/2017   Moderate episode of recurrent major depressive disorder ([place]) 05/28/2017   RBD (REM behavioral disorder) 10/16/2016   Chronic depression 10/16/2016   Fatigue 10/16/2016   Vertigo 03/12/2016  Pulmonary air trapping 02/24/2015   DOE (dyspnea on exertion) 11/18/2013   Hot flashes 11/18/2013   Memory impairment 08/04/2013   Orthostasis 02/26/2013   Tachycardia 02/24/2013   Coronary artery calcification seen on CAT scan 01/01/2013   Tendonitis, calcific, shoulder 01/01/2013   OSA (obstructive sleep apnea) 01/01/2013   PD (Parkinson's disease) ([place]) 11/26/2011   Hematochezia 11/09/2011   Colon polyps 11/09/2011   IBS (irritable bowel syndrome) 11/09/2011   Anosmia 11/06/2010   Impaired glucose tolerance 11/04/2010    Hearing loss 05/07/2010   Preventative health care 05/07/2010   BUNION, RIGHT FOOT 09/18/2009   GERD 05/24/2008   OSTEOPOROSIS 05/24/2008   Hyperlipidemia 09/23/2006   Anxiety state 09/23/2006   Depression 09/23/2006   CARPAL TUNNEL SYNDROME, BILATERAL 09/23/2006   Essential hypertension 09/23/2006   DIVERTICULOSIS, COLON 09/23/2006   Carpal tunnel syndrome, bilateral 09/23/2006    PCP: Dr Leeroy Cha REFERRING PROVIDER: Danie Binder, FNP  REFERRING DIAG: R42 (ICD-10-CM) - Dizziness and giddiness R26.89 (ICD-10-CM) - Other abnormalities of gait and mobility  THERAPY DIAG:  Dizziness and giddiness  Unsteadiness on feet  Abnormality of gait and mobility  ONSET DATE: 12/27/2021 (referral date)  Rationale for Evaluation and Treatment: Rehabilitation  SUBJECTIVE:   SUBJECTIVE STATEMENT: Patient reports to session stating that she is doing well. Started new medication for memory and it was updated in chart so no changes were needed to medication list. Patient reports that she notices increased fatigue at end of day as well as increased balance deficits. Patient states she is otherwise doing well. Patient reports that she has not been able to do her homework since last session due to other scheduling conflicts but will try to do it a few times before next session. Patient reports occasional vision abnormalities like slanted vision or double vision consistent with central involvement.   Pt accompanied by: significant other - husband [name]  PERTINENT HISTORY: Sarcodosis, small fiber neuropathy, lumbar fusion on October 2, Parkinson's Disease diangosed in 2018; see medical chart for additional details  PAIN:  Are you having pain? Yes - in feet. Has neuropathy.   PRECAUTIONS: Back and Fall  WEIGHT BEARING RESTRICTIONS: No  FALLS: Has patient fallen in last 6 months? Yes. Number of falls 1 - denies any injuries with falls but had mistep into sunken living room  due to dizziness  Previous falls a few years ago when she cut her head.   LIVING ENVIRONMENT: Lives with: lives with their family Lives in: House/apartment Stairs: 2 steps into the living room, 4 steps into house with a handrail Has following equipment at home: Single point cane and Walker - 2 wheeled  PLOF: Independent  PATIENT GOALS: "To get rid or improve this feeling of dizziness."  OBJECTIVE:    There were no vitals filed for this visit.  FOTO Progress Report 02/04/2022: 44   VESTIBULAR TREATMENT:                                                                                                    NMR: VORx1 2 x 60" viewing in standing  with busy background (CGA)  3 x 115' with CGA moving ball in semicircle/up and down/head turns (vertical/horizontal) for increasing dynamic stability (minA x 1)  EC balance with SBA 3 x 30" feet close together  EO balance with head turns (vertical/horizontal) 2 x 30" (minA x 1)  FOTO survey for vestibular impairments/review of results and comparison to initial    PATIENT EDUCATION: Education details: Standing balance additions to HEP (see [employer] additions below). Education on 3 balance systems, purpose of habituation exercises.  Person educated: Patient and Spouse Education method: Explanation, Demonstration, and Handouts Education comprehension: verbalized understanding and returned demonstration  HOME EXERCISE PROGRAM: Rolling    With pillow under head, start on back. Roll slowly to right. Hold position until symptoms subside. Roll slowly onto left side. Hold position until symptoms subside. Repeat sequence 10 times per session. Do 2 sessions per day.  VOR x 1 viewing 1 x 3x day for 60" (horizontal and vertical - printout from [employer] website  Access Code: [email or web address] URL: [email or web address] Date: 02/01/2022 Prepared by: [place] with Eyes Closed  - 1 x daily - 5 x weekly -  3 sets - 30 hold - Romberg Stance with Head Rotation  - 1 x daily - 5 x weekly - 2 sets - 10 reps and head nods  - Standing Single Leg Stance with Counter Support  - 1 x daily - 5 x weekly - 3 sets - 10 hold  GOALS: Goals reviewed with patient? Yes  SHORT TERM GOALS: Target date: 02/05/2022  Patient will demonstrate 100% compliance with initial HEP to continue to progress between physical therapy sessions.   Baseline: Reported completing rolling exercises a few times since last session but has not performed other HEP work, plans to complete a few times before next session Goal status: IN PROGRESS  2.  Patient will improve FOTO score by 5 points to demonstrate improved function and mobility impacted by dizziness. Baseline: 33, Improved to 44 (11 point improvement) Goal status: MET  3.  Therapist will assess positional testing to assess for BPPV with goal to be added if appropriate in order to progress treatment plan moving forward. Baseline: BPPV Assessed, negative, and no additional goal required Goal status: MET  LONG TERM GOALS: Target date: 02/26/2022   Patient will report demonstrate independence with final HEP in order to maintain current gains and continue to progress after physical therapy discharge.   Baseline: To be provided Goal status: INITIAL  2.  Patient will improve FOTO score to 47 points to demonstrate improved function and mobility impacted by dizziness. Baseline: 33; Improved to 44 (11 point improvement) Goal status: INITIAL  3.  Patient will improve MSQ score to 40% or less to indicate a reduction in motion sensitivity.  Baseline: Reports dizziness with all movements Goal status: REVISED  ASSESSMENT:  CLINICAL IMPRESSION:  Session emphasized assessment of short term goals and progression of vestibular habituation with standing exercises as well as dynamic balance tasks. Patient progressing well towards goals, achieving 11 points of progress in FOTO. Patient is  notably unsteady during dynamic and static balance tasks and patient politely refuses use of AD for life outside of therapy despite therapist education and strong recommendation. Patient requires minA x 1 with head turns static balance and weaves widely outside of BOS during EO gait with head turns both vertical and horizontal. Reports only mild feeling of "waviness" throughout session. Pt educated on only moving head as tends to try  to attempt whole trunk rotation during head turn exercises; improved with cues. Pt tolerated session well, will continue per POC.   OBJECTIVE IMPAIRMENTS: Abnormal gait, decreased balance, decreased mobility, difficulty walking, dizziness, and impaired vision/preception.   ACTIVITY LIMITATIONS: bending, sitting, standing, stairs, and transfers  PARTICIPATION LIMITATIONS: interpersonal relationship, driving, and community activity  PERSONAL FACTORS: Age, Transportation, and 3+ comorbidities: see above for details including PD, sclerosis, and  are also affecting patient's functional outcome.   REHAB POTENTIAL: Good  CLINICAL DECISION MAKING: Evolving/moderate complexity  EVALUATION COMPLEXITY: Moderate   PLAN:  PT FREQUENCY: 2x/week  PT DURATION: 6 weeks  PLANNED INTERVENTIONS: Therapeutic exercises, Therapeutic activity, Neuromuscular re-education, Balance training, Gait training, Patient/Family education, Self Care, Vestibular training, and Canalith repositioning  PLAN FOR NEXT SESSION: add to HEP as needed. STGs due this week. Progress VOR x 1 viewing, MSQ components, habituation; horizontal head movements; standing balance tasks on unlevel surfaces, EC, narrow BOS and trying to decr UE support   Malachi Carl, PT, DPT 02/04/22 1:36 PM

## 2022-02-04 NOTE — Addendum Note (Signed)
Addended by: Suzzanne Cloud E on: 02/04/2022 01:38 PM   Modules accepted: Orders

## 2022-02-07 DIAGNOSIS — R41841 Cognitive communication deficit: Secondary | ICD-10-CM | POA: Diagnosis not present

## 2022-02-07 DIAGNOSIS — G20A1 Parkinson's disease without dyskinesia, without mention of fluctuations: Secondary | ICD-10-CM | POA: Diagnosis not present

## 2022-02-07 DIAGNOSIS — D869 Sarcoidosis, unspecified: Secondary | ICD-10-CM | POA: Diagnosis not present

## 2022-02-07 IMAGING — DX DG CHEST 2V
2 series · 2 of 2 positions shown · non-contrast
Comparison: June 22, 2019

CLINICAL DATA: Chest pain

EXAM:
CHEST - 2 VIEW

[chest ap]
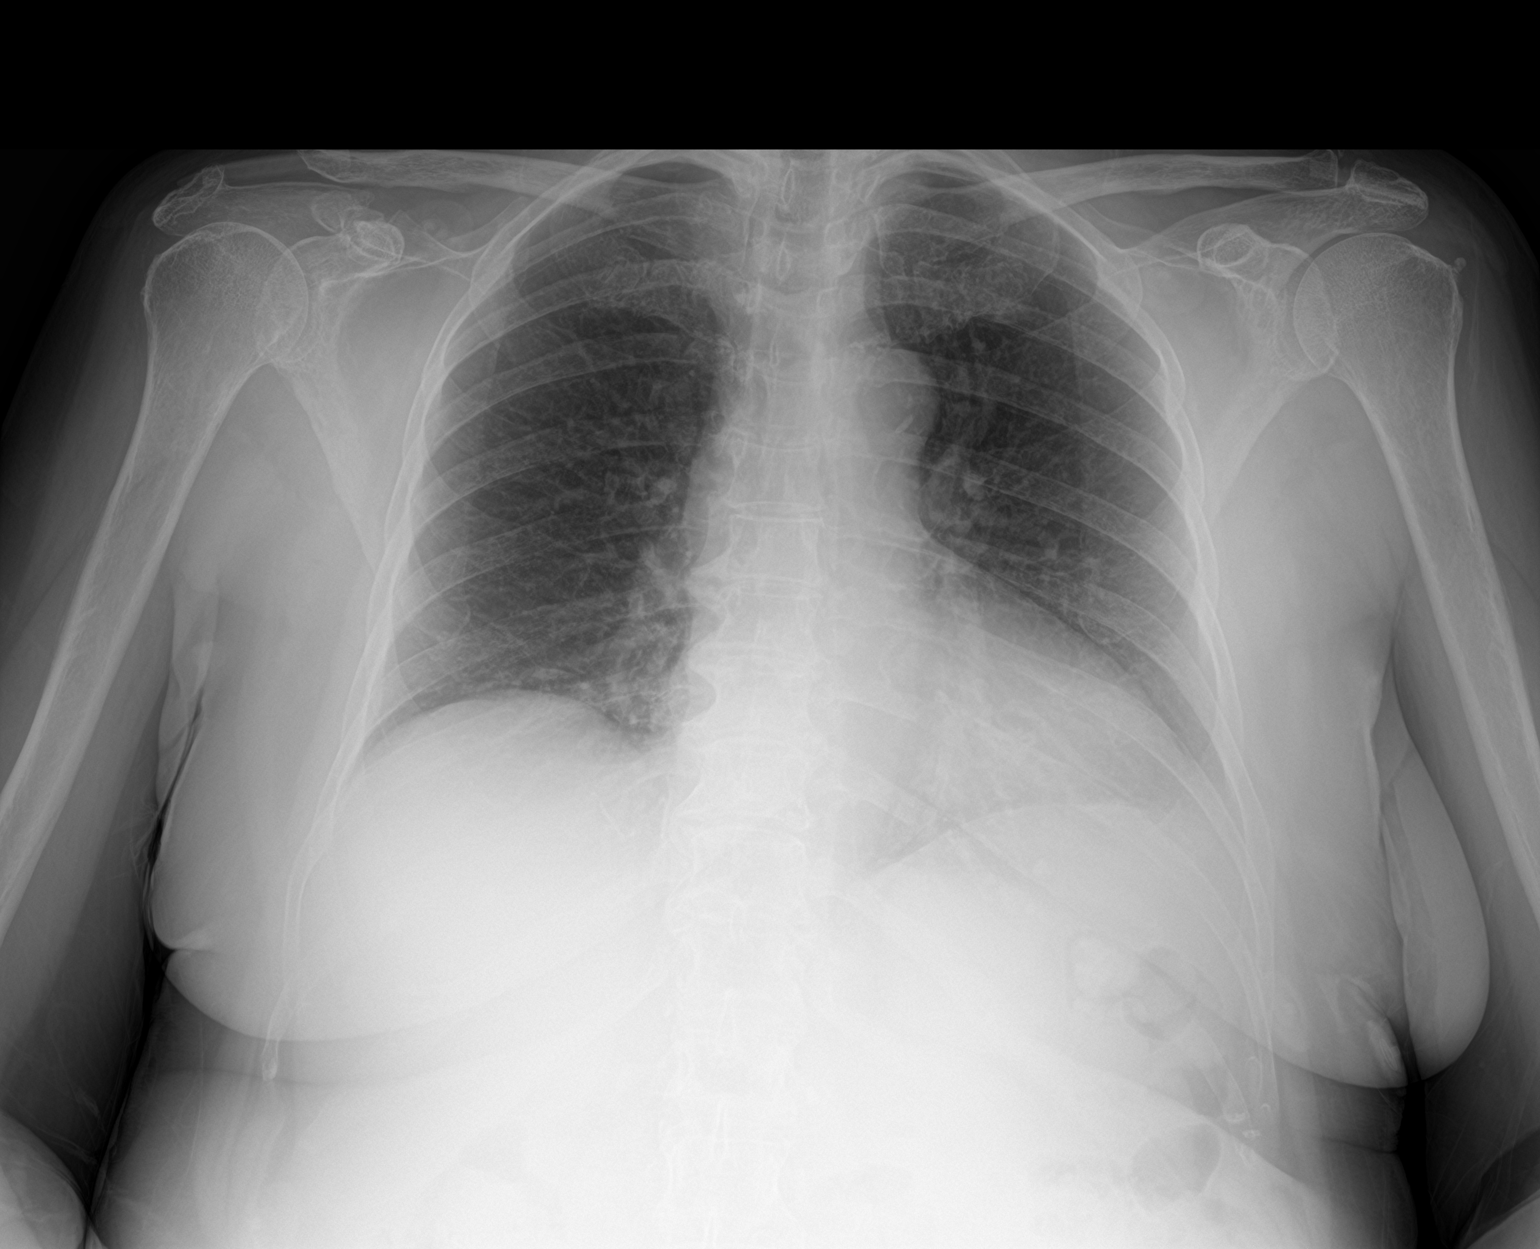

[chest lat]
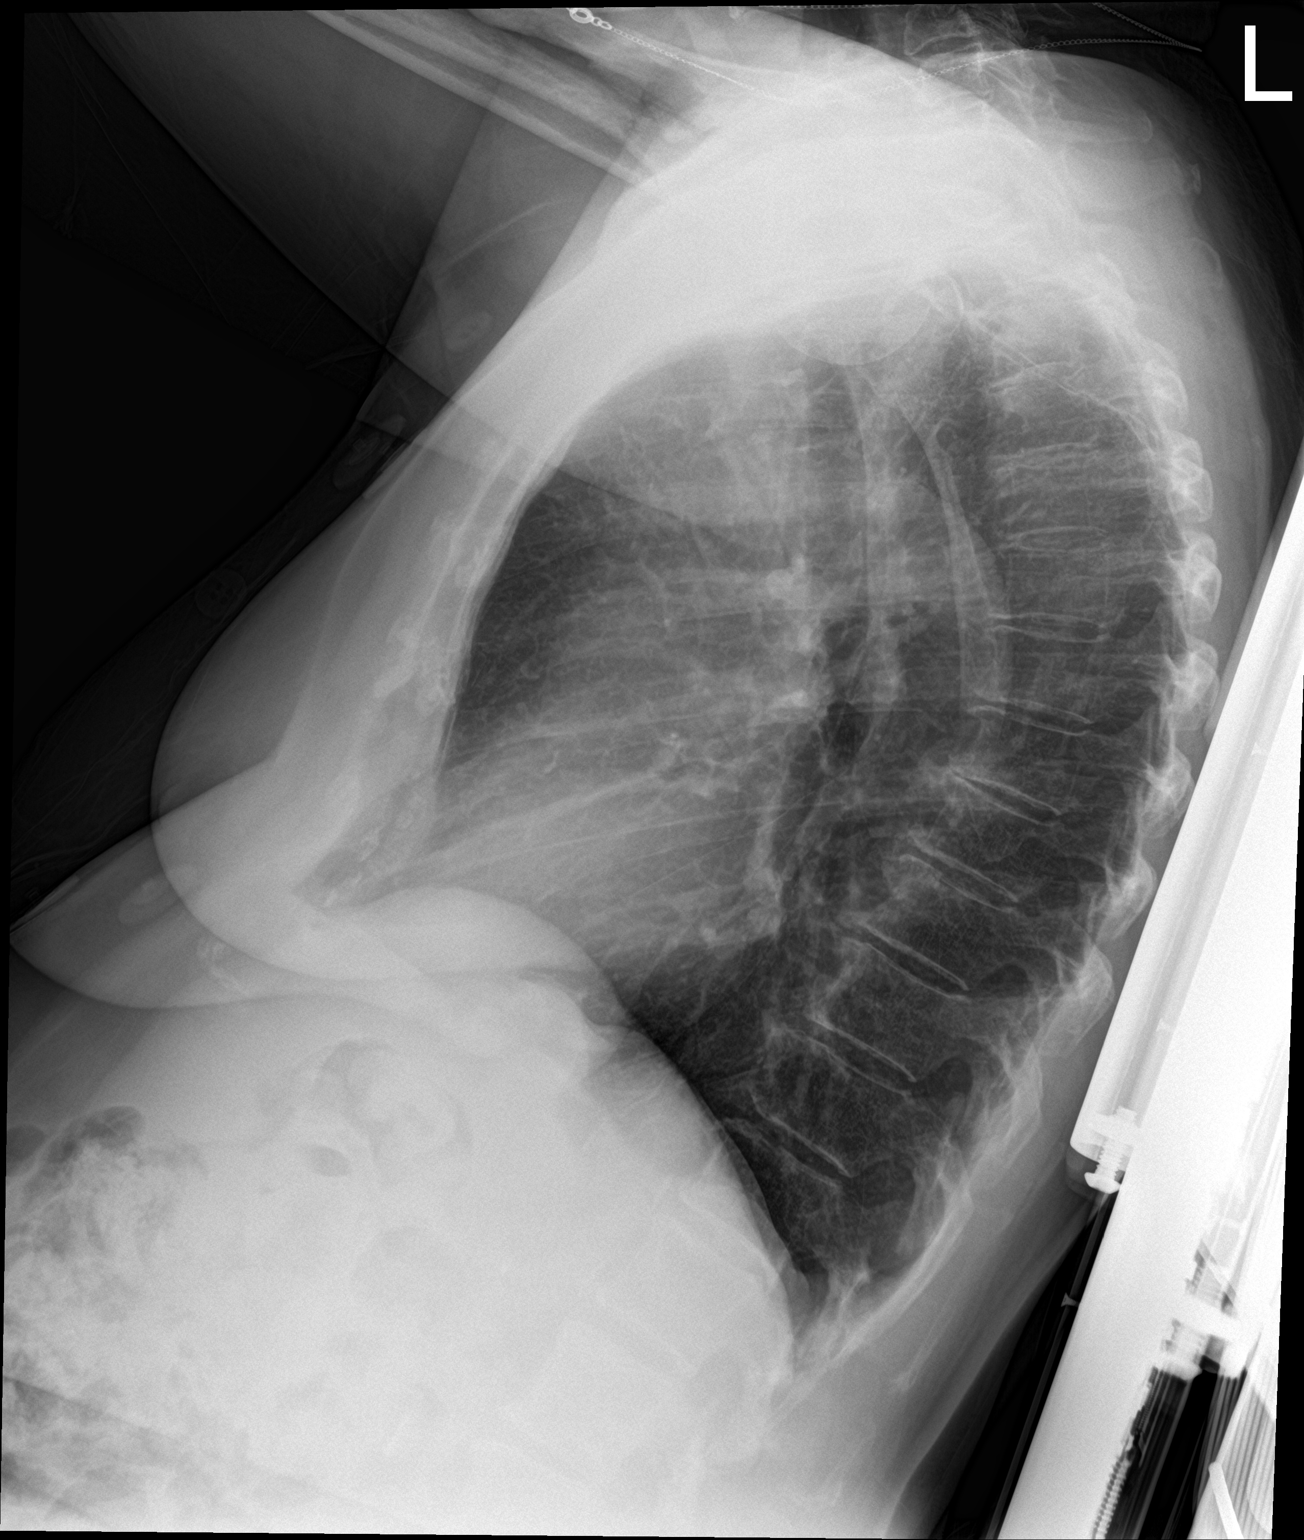

[2 of 2 positions shown; findings below may reference images not displayed]

FINDINGS: The heart size is mildly enlarged. Aortic calcifications are noted.
There is no pneumothorax. No large pleural effusion. No acute
osseous abnormality.
IMPRESSION: Mild cardiomegaly. No acute cardiopulmonary process.

## 2022-02-08 ENCOUNTER — Ambulatory Visit: Payer: Medicare Other | Admitting: Physical Therapy

## 2022-02-08 ENCOUNTER — Encounter: Payer: Self-pay | Admitting: Physical Therapy

## 2022-02-08 VITALS — BP 106/80 | HR 90

## 2022-02-08 DIAGNOSIS — R2681 Unsteadiness on feet: Secondary | ICD-10-CM | POA: Diagnosis not present

## 2022-02-08 DIAGNOSIS — R2689 Other abnormalities of gait and mobility: Secondary | ICD-10-CM | POA: Diagnosis not present

## 2022-02-08 DIAGNOSIS — R269 Unspecified abnormalities of gait and mobility: Secondary | ICD-10-CM | POA: Diagnosis not present

## 2022-02-08 DIAGNOSIS — R42 Dizziness and giddiness: Secondary | ICD-10-CM | POA: Diagnosis not present

## 2022-02-08 DIAGNOSIS — D869 Sarcoidosis, unspecified: Secondary | ICD-10-CM | POA: Diagnosis not present

## 2022-02-08 DIAGNOSIS — M5451 Vertebrogenic low back pain: Secondary | ICD-10-CM | POA: Diagnosis not present

## 2022-02-08 NOTE — Therapy (Signed)
OUTPATIENT PHYSICAL THERAPY VESTIBULAR TREATMENT  Patient Name: Destiny Roth MRN: [id number] DOB:Jan 16, 1951, 72 y.o., female Today's Date: 02/08/2022  END OF SESSION:  PT End of Session - 02/08/22 1236     Visit Number 6    Number of Visits 13    Date for PT Re-Evaluation 02/26/22    Authorization Type Medicare Part A & B    PT Start Time 1233    PT Stop Time 1315    PT Time Calculation (min) 42 min    Equipment Utilized During Treatment Gait belt    Activity Tolerance Patient tolerated treatment well    Behavior During Therapy WFL for tasks assessed/performed             Past Medical History:  Diagnosis Date   Abdominal pain, epigastric 09/18/2009   Anosmia 11/06/2010   ANXIETY 09/23/2006   BUNION, RIGHT FOOT 09/18/2009   CARPAL TUNNEL SYNDROME, BILATERAL 09/23/2006   DEPRESSION 09/23/2006   DIVERTICULOSIS, COLON 09/23/2006   GERD 05/24/2008   GLUCOSE INTOLERANCE 05/24/2008   Hemorrhoids    Hypercalcemia    HYPERLIPIDEMIA 09/23/2006   HYPERTENSION 09/23/2006   IBS 09/23/2006   Impaired glucose tolerance 11/04/2010   OSTEOPOROSIS 05/24/2008   06/01/19-pt states osteopenia   Parkinson's disease    Restrictive lung disease 12/20/2013   Sarcoidosis 02/26/2019   Past Surgical History:  Procedure Laterality Date   ABDOMINAL EXPOSURE N/A 10/29/2021   Procedure: LATERAL EXPOSURE FOR OBLIQUE SPINE SURGERY;  Surgeon: Marty Heck, MD;  Location: [place];  Service: Vascular;  Laterality: N/A;   CARDIAC CATHETERIZATION     CATARACT EXTRACTION     COLONOSCOPY  2008   COLONOSCOPY W/ BIOPSIES  2008   Dr. Collene Mares -negative random bxs   ESOPHAGOGASTRODUODENOSCOPY  2008   Dr. Harriette Bouillon duodenal ulcer   FOOT SURGERY Bilateral 2015   LEFT AND RIGHT HEART CATHETERIZATION WITH CORONARY ANGIOGRAM N/A 11/09/2013   Procedure: LEFT AND RIGHT HEART CATHETERIZATION WITH CORONARY ANGIOGRAM;  Surgeon: Peter M Martinique, MD;  Location: [facility] CATH LAB;  Service: Cardiovascular;  Laterality: N/A;    nasal septum repair     OBLIQUE LUMBAR INTERBODY FUSION 1 LEVEL WITH PERCUTANEOUS SCREWS N/A 10/29/2021   Procedure: OBLIQUE LUMBAR INTERBODY FUSION WITH SUPPLEMENTAL PEDICLE SCREW FIXATION LUMBAR FOUR TO FIVE;  Surgeon: Melina Schools, MD;  Location: [place];  Service: Orthopedics;  Laterality: N/A;   s/p bilat CTS     s/p fibroid ablation     s/p ganglion cyst right wrist     s/p right thumb trigger finger surgury     SHOULDER SURGERY Right 2015   TUBAL LIGATION     UPPER GASTROINTESTINAL ENDOSCOPY     Patient Active Problem List   Diagnosis Date Noted   S/P lumbar fusion 10/29/2021   Small fiber neuropathy 11/20/2020   Lumbar radiculopathy 06/28/2020   Chronic neck pain 06/20/2020   Degenerative spondylolisthesis 06/20/2020   High risk medication use 04/27/2020   Panuveitis of both eyes 04/27/2020   Parkinson's disease 09/19/2019   Optic nerve edema 07/06/2019   Peripheral focal chorioretinal inflammation of both eyes 07/06/2019   Retinal edema 07/06/2019   Therapeutic drug monitoring 05/26/2019   Visual disturbance 04/27/2019   Sarcoidosis 02/26/2019   Hypercalcemia 02/04/2019   Generalized weakness 02/04/2019   Left-sided vestibular weakness 07/24/2017   Moderate episode of recurrent major depressive disorder ([place]) 05/28/2017   RBD (REM behavioral disorder) 10/16/2016   Chronic depression 10/16/2016   Fatigue 10/16/2016   Vertigo 03/12/2016  Pulmonary air trapping 02/24/2015   DOE (dyspnea on exertion) 11/18/2013   Hot flashes 11/18/2013   Memory impairment 08/04/2013   Orthostasis 02/26/2013   Tachycardia 02/24/2013   Coronary artery calcification seen on CAT scan 01/01/2013   Tendonitis, calcific, shoulder 01/01/2013   OSA (obstructive sleep apnea) 01/01/2013   PD (Parkinson's disease) ([place]) 11/26/2011   Hematochezia 11/09/2011   Colon polyps 11/09/2011   IBS (irritable bowel syndrome) 11/09/2011   Anosmia 11/06/2010   Impaired glucose tolerance 11/04/2010    Hearing loss 05/07/2010   Preventative health care 05/07/2010   BUNION, RIGHT FOOT 09/18/2009   GERD 05/24/2008   OSTEOPOROSIS 05/24/2008   Hyperlipidemia 09/23/2006   Anxiety state 09/23/2006   Depression 09/23/2006   CARPAL TUNNEL SYNDROME, BILATERAL 09/23/2006   Essential hypertension 09/23/2006   DIVERTICULOSIS, COLON 09/23/2006   Carpal tunnel syndrome, bilateral 09/23/2006    PCP: Dr Leeroy Cha REFERRING PROVIDER: Danie Binder, FNP  REFERRING DIAG: R42 (ICD-10-CM) - Dizziness and giddiness R26.89 (ICD-10-CM) - Other abnormalities of gait and mobility  THERAPY DIAG:  Dizziness and giddiness  Unsteadiness on feet  Abnormality of gait and mobility  ONSET DATE: 12/27/2021 (referral date)  Rationale for Evaluation and Treatment: Rehabilitation  SUBJECTIVE:   SUBJECTIVE STATEMENT: Patient reports that she was doing well until yesterday. She woke up feeling off, reporting general mailase and feeling shaky. She states that she thinks it may be related to her Parkinson's. She also reports increase pain in her both feet which she relates to her neuropathy. Patient reports back pain. Patient denies any falls or near falls since last session. Patient has an appointment with her pulmonologist next Wednesdays and has a root canal next Tuesday. Reports generalized symptoms as 8/10 throughout session.   Pt accompanied by: significant other - husband [name]  PERTINENT HISTORY: Sarcodosis, small fiber neuropathy, lumbar fusion on October 2, Parkinson's Disease diangosed in 2018; see medical chart for additional details  PAIN:  Are you having pain? Yes - in feet. Has neuropathy. Back pain is 2-3/10 today with tylenol.   PRECAUTIONS: Back and Fall  WEIGHT BEARING RESTRICTIONS: No  FALLS: Has patient fallen in last 6 months? Yes. Number of falls 1 - denies any injuries with falls but had mistep into sunken living room due to dizziness  Previous falls a few years  ago when she cut her head.   LIVING ENVIRONMENT: Lives with: lives with their family Lives in: House/apartment Stairs: 2 steps into the living room, 4 steps into house with a handrail Has following equipment at home: Single point cane and Walker - 2 wheeled  PLOF: Independent  PATIENT GOALS: "To get rid or improve this feeling of dizziness."  OBJECTIVE:    Vitals:   02/08/22 1244  BP: 106/80  Pulse: 90    FOTO Progress Report 02/04/2022: 44   VESTIBULAR TREATMENT:                                                                                                    NMR: VORx1 2 x 60" viewing in sitting with busy background (CGA)  Mirroring therapist movements when able (requires mod verbal/visual cues for sequencing) Sit to stands without UE use to stand/sit + large amplitude horizontal reaching with arms and counting 2 x 10 (SBA-CGA)   Fwd step/returns with large amplitude horizontal reaching 1 x 10 left LE, right LE, bilateral alternating (CGA-minA from therapist for stability)  Lateral step/returns with large amplitude horizontal reaching with horizontal head turns 1 x 10 left LE, right LE, bilateral alternating (CGA-minA from therapist for stability)  Backward step/returns with large amplitude horizontal reaching 1 x 10 left LE, right LE, bilateral alternating (CGA-minA from therapist for stability)   PATIENT EDUCATION: Education details: Standing balance additions to HEP (see [employer] additions below). Home safety with exercises including setup and supervision for safety Person educated: Patient and Spouse Education method: Explanation, Demonstration, and Handouts Education comprehension: verbalized understanding and returned demonstration  HOME EXERCISE PROGRAM: Rolling    With pillow under head, start on back. Roll slowly to right. Hold position until symptoms subside. Roll slowly onto left side. Hold position until symptoms subside. Repeat sequence 10 times per  session. Do 2 sessions per day.  VOR x 1 viewing 1 x 3x day for 60" (horizontal and vertical - printout from [employer] website  Access Code: [email or web address] URL: [email or web address] Date: 02/08/2022 Prepared by: Malachi Carl  Exercises - Standing Balance with Eyes Closed  - 1 x daily - 5 x weekly - 3 sets - 30 hold - Romberg Stance with Head Rotation  - 1 x daily - 5 x weekly - 2 sets - 10 reps - Standing Single Leg Stance with Counter Support  - 1 x daily - 5 x weekly - 3 sets - 10 hold - Sit to Stand Without Arm Support  - 1 x daily - 7 x weekly - 3 sets - 10 reps  GOALS: Goals reviewed with patient? Yes  SHORT TERM GOALS: Target date: 02/05/2022  Patient will demonstrate 100% compliance with initial HEP to continue to progress between physical therapy sessions.   Baseline: Reported completing rolling exercises a few times since last session but has not performed other HEP work, plans to complete a few times before next session Goal status: IN PROGRESS  2.  Patient will improve FOTO score by 5 points to demonstrate improved function and mobility impacted by dizziness. Baseline: 33, Improved to 44 (11 point improvement) Goal status: MET  3.  Therapist will assess positional testing to assess for BPPV with goal to be added if appropriate in order to progress treatment plan moving forward. Baseline: BPPV Assessed, negative, and no additional goal required Goal status: MET  LONG TERM GOALS: Target date: 02/26/2022   Patient will report demonstrate independence with final HEP in order to maintain current gains and continue to progress after physical therapy discharge.   Baseline: To be provided Goal status: INITIAL  2.  Patient will improve FOTO score to 47 points to demonstrate improved function and mobility impacted by dizziness. Baseline: 33; Improved to 44 (11 point improvement) Goal status: INITIAL  3.  Patient will improve MSQ score to 40% or less to indicate a  reduction in motion sensitivity.  Baseline: Reports dizziness with all movements Goal status: REVISED  ASSESSMENT:  CLINICAL IMPRESSION:  Session emphasized dynamic movements during functional balance tasks with integration of vestibular challenges with careful consideration for modifications as needed for spinal precautions. Patient required close guarding and min A from therapist throughout session for safety and required mod-max verbal and visual cues to maintain sequencing  and amplitude of movements. Patient demonstrated ~4x festination of steps throughout activities and required min A from therapist to regain balance. Educated patient that some of exercises performed in session were only for therapy at this time due to balance concerns and provided updated safe HEP for home. Patient will benefit from skilled physical therapy services to address deficits.   OBJECTIVE IMPAIRMENTS: Abnormal gait, decreased balance, decreased mobility, difficulty walking, dizziness, and impaired vision/preception.   ACTIVITY LIMITATIONS: bending, sitting, standing, stairs, and transfers  PARTICIPATION LIMITATIONS: interpersonal relationship, driving, and community activity  PERSONAL FACTORS: Age, Transportation, and 3+ comorbidities: see above for details including PD, sclerosis, and  are also affecting patient's functional outcome.   REHAB POTENTIAL: Good  CLINICAL DECISION MAKING: Evolving/moderate complexity  EVALUATION COMPLEXITY: Moderate   PLAN:  PT FREQUENCY: 2x/week  PT DURATION: 6 weeks  PLANNED INTERVENTIONS: Therapeutic exercises, Therapeutic activity, Neuromuscular re-education, Balance training, Gait training, Patient/Family education, Self Care, Vestibular training, and Canalith repositioning  PLAN FOR NEXT SESSION: add to HEP as needed. STGs due this week. Progress VOR x 1 viewing, MSQ components, habituation; horizontal head movements; standing balance tasks on unlevel surfaces, EC,  narrow BOS and trying to decr UE support, large amplitude dynamic movements with spinal precautions  Malachi Carl, PT, DPT 02/08/22 2:38 PM

## 2022-02-11 ENCOUNTER — Ambulatory Visit: Payer: Medicare Other | Admitting: Physical Therapy

## 2022-02-11 ENCOUNTER — Encounter: Payer: Self-pay | Admitting: Physical Therapy

## 2022-02-11 VITALS — BP 112/82 | HR 103

## 2022-02-11 DIAGNOSIS — R269 Unspecified abnormalities of gait and mobility: Secondary | ICD-10-CM

## 2022-02-11 DIAGNOSIS — R2681 Unsteadiness on feet: Secondary | ICD-10-CM

## 2022-02-11 DIAGNOSIS — R42 Dizziness and giddiness: Secondary | ICD-10-CM

## 2022-02-11 DIAGNOSIS — D869 Sarcoidosis, unspecified: Secondary | ICD-10-CM | POA: Diagnosis not present

## 2022-02-11 DIAGNOSIS — M5451 Vertebrogenic low back pain: Secondary | ICD-10-CM | POA: Diagnosis not present

## 2022-02-11 DIAGNOSIS — R2689 Other abnormalities of gait and mobility: Secondary | ICD-10-CM | POA: Diagnosis not present

## 2022-02-11 NOTE — Therapy (Signed)
OUTPATIENT PHYSICAL THERAPY VESTIBULAR TREATMENT  Patient Name: Destiny Roth MRN: [id number] DOB:1950/12/10, 72 y.o., female Today's Date: 02/11/2022  END OF SESSION:  PT End of Session - 02/11/22 1229     Visit Number 7    Number of Visits 13    Date for PT Re-Evaluation 02/26/22    Authorization Type Medicare Part A & B    PT Start Time 1228    PT Stop Time 1318    PT Time Calculation (min) 50 min    Equipment Utilized During Treatment Gait belt    Activity Tolerance Patient tolerated treatment well    Behavior During Therapy WFL for tasks assessed/performed             Past Medical History:  Diagnosis Date   Abdominal pain, epigastric 09/18/2009   Anosmia 11/06/2010   ANXIETY 09/23/2006   BUNION, RIGHT FOOT 09/18/2009   CARPAL TUNNEL SYNDROME, BILATERAL 09/23/2006   DEPRESSION 09/23/2006   DIVERTICULOSIS, COLON 09/23/2006   GERD 05/24/2008   GLUCOSE INTOLERANCE 05/24/2008   Hemorrhoids    Hypercalcemia    HYPERLIPIDEMIA 09/23/2006   HYPERTENSION 09/23/2006   IBS 09/23/2006   Impaired glucose tolerance 11/04/2010   OSTEOPOROSIS 05/24/2008   06/01/19-pt states osteopenia   Parkinson's disease    Restrictive lung disease 12/20/2013   Sarcoidosis 02/26/2019   Past Surgical History:  Procedure Laterality Date   ABDOMINAL EXPOSURE N/A 10/29/2021   Procedure: LATERAL EXPOSURE FOR OBLIQUE SPINE SURGERY;  Surgeon: Marty Heck, MD;  Location: [facility];  Service: Vascular;  Laterality: N/A;   CARDIAC CATHETERIZATION     CATARACT EXTRACTION     COLONOSCOPY  2008   COLONOSCOPY W/ BIOPSIES  2008   Dr. Collene Mares -negative random bxs   ESOPHAGOGASTRODUODENOSCOPY  2008   Dr. Harriette Bouillon duodenal ulcer   FOOT SURGERY Bilateral 2015   LEFT AND RIGHT HEART CATHETERIZATION WITH CORONARY ANGIOGRAM N/A 11/09/2013   Procedure: LEFT AND RIGHT HEART CATHETERIZATION WITH CORONARY ANGIOGRAM;  Surgeon: Peter M Martinique, MD;  Location: [facility] CATH LAB;  Service: Cardiovascular;  Laterality: N/A;    nasal septum repair     OBLIQUE LUMBAR INTERBODY FUSION 1 LEVEL WITH PERCUTANEOUS SCREWS N/A 10/29/2021   Procedure: OBLIQUE LUMBAR INTERBODY FUSION WITH SUPPLEMENTAL PEDICLE SCREW FIXATION LUMBAR FOUR TO FIVE;  Surgeon: Melina Schools, MD;  Location: [facility];  Service: Orthopedics;  Laterality: N/A;   s/p bilat CTS     s/p fibroid ablation     s/p ganglion cyst right wrist     s/p right thumb trigger finger surgury     SHOULDER SURGERY Right 2015   TUBAL LIGATION     UPPER GASTROINTESTINAL ENDOSCOPY     Patient Active Problem List   Diagnosis Date Noted   S/P lumbar fusion 10/29/2021   Small fiber neuropathy 11/20/2020   Lumbar radiculopathy 06/28/2020   Chronic neck pain 06/20/2020   Degenerative spondylolisthesis 06/20/2020   High risk medication use 04/27/2020   Panuveitis of both eyes 04/27/2020   Parkinson's disease 09/19/2019   Optic nerve edema 07/06/2019   Peripheral focal chorioretinal inflammation of both eyes 07/06/2019   Retinal edema 07/06/2019   Therapeutic drug monitoring 05/26/2019   Visual disturbance 04/27/2019   Sarcoidosis 02/26/2019   Hypercalcemia 02/04/2019   Generalized weakness 02/04/2019   Left-sided vestibular weakness 07/24/2017   Moderate episode of recurrent major depressive disorder ([place]) 05/28/2017   RBD (REM behavioral disorder) 10/16/2016   Chronic depression 10/16/2016   Fatigue 10/16/2016   Vertigo 03/12/2016  Pulmonary air trapping 02/24/2015   DOE (dyspnea on exertion) 11/18/2013   Hot flashes 11/18/2013   Memory impairment 08/04/2013   Orthostasis 02/26/2013   Tachycardia 02/24/2013   Coronary artery calcification seen on CAT scan 01/01/2013   Tendonitis, calcific, shoulder 01/01/2013   OSA (obstructive sleep apnea) 01/01/2013   PD (Parkinson's disease) ([place]) 11/26/2011   Hematochezia 11/09/2011   Colon polyps 11/09/2011   IBS (irritable bowel syndrome) 11/09/2011   Anosmia 11/06/2010   Impaired glucose tolerance 11/04/2010    Hearing loss 05/07/2010   Preventative health care 05/07/2010   BUNION, RIGHT FOOT 09/18/2009   GERD 05/24/2008   OSTEOPOROSIS 05/24/2008   Hyperlipidemia 09/23/2006   Anxiety state 09/23/2006   Depression 09/23/2006   CARPAL TUNNEL SYNDROME, BILATERAL 09/23/2006   Essential hypertension 09/23/2006   DIVERTICULOSIS, COLON 09/23/2006   Carpal tunnel syndrome, bilateral 09/23/2006    PCP: Dr Leeroy Cha REFERRING PROVIDER: Danie Binder, FNP  REFERRING DIAG: R42 (ICD-10-CM) - Dizziness and giddiness R26.89 (ICD-10-CM) - Other abnormalities of gait and mobility  THERAPY DIAG:  Dizziness and giddiness  Unsteadiness on feet  Abnormality of gait and mobility  ONSET DATE: 12/27/2021 (referral date)  Rationale for Evaluation and Treatment: Rehabilitation  SUBJECTIVE:   SUBJECTIVE STATEMENT: Patient reports that she is not "doing well neurologically." She has noticed increased shakiness. She states that it took twenty minutes to clasp her bra and she states that she is not feeling like herself. Patient next neurology visit in August. Patient has a follow up with orthopedic spine doctor in March. Rates generalized symptoms 8/10. Patient states that in mornings she goes and sits on the couch for a while and cannot face getting ready. Patient feeling more unstable several times over the weekend but denies any falls though reports multiple possible near falls. Discussed at end of session need for AD and patient and caregiver verbalized more willingness to trial in future sessions.   Pt accompanied by: significant other - husband [name]  PERTINENT HISTORY: Sarcodosis, small fiber neuropathy, lumbar fusion on October 2, Parkinson's Disease diangosed in 2018; see medical chart for additional details  PAIN:  Are you having pain? Yes - in feet. Has neuropathy. Back pain is 2-3/10 today with tylenol.   PRECAUTIONS: Back and Fall  WEIGHT BEARING RESTRICTIONS: No  FALLS:  Has patient fallen in last 6 months? Yes. Number of falls 1 - denies any injuries with falls but had mistep into sunken living room due to dizziness  Previous falls a few years ago when she cut her head.   LIVING ENVIRONMENT: Lives with: lives with their family Lives in: House/apartment Stairs: 2 steps into the living room, 4 steps into house with a handrail Has following equipment at home: Single point cane and Walker - 2 wheeled  PLOF: Independent  PATIENT GOALS: "To get rid or improve this feeling of dizziness."  OBJECTIVE:    Vitals:   02/11/22 1307  BP: 112/82  Pulse: (!) 103    FOTO Progress Report 02/04/2022: 44   VESTIBULAR TREATMENT:  NMR:   Mirroring therapist movements when able (requires mod verbal/visual cues for sequencing) Sit to stands without UE use to stand/sit + large amplitude horizontal reaching with arms and counting 1 x 10 (SBA-CGA) (x2 episodes of festination)   Fwd step/returns with large amplitude horizontal reaching 1 x 10 left LE, right LE, bilateral alternating (CGA-minA from therapist for stability) (x2 episodes of festination)  Lateral step/returns with large amplitude horizontal reaching with horizontal head turns 1 x 10 left LE, right LE, bilateral alternating (CGA-minA from therapist for stability)  Backward step/returns with large amplitude horizontal reaching 1 x 10 left LE, right LE, bilateral alternating (CGA-minA from therapist for stability)   Blaze pod stepping in semicircle 3 x 60" with 30" breaks for stepping reactions and visual scanning (CGA-minA)  (x4 episodes of festination)  4" Step taps to external target line on step for increased amplitude movement and functional balance (1 x 10 bil, 1 x 10 bil alt)   PATIENT EDUCATION: Education details: recommended use of AD given current increase in festination noted in session Person educated:  Patient and Spouse Education method: Explanation, Demonstration, and Handouts Education comprehension: verbalized understanding and returned demonstration  HOME EXERCISE PROGRAM: Rolling    With pillow under head, start on back. Roll slowly to right. Hold position until symptoms subside. Roll slowly onto left side. Hold position until symptoms subside. Repeat sequence 10 times per session. Do 2 sessions per day.  VOR x 1 viewing 1 x 3x day for 60" (horizontal and vertical - printout from [employer] website  Access Code: [email or web address] URL: [email or web address] Date: 02/08/2022 Prepared by: Malachi Carl  Exercises - Standing Balance with Eyes Closed  - 1 x daily - 5 x weekly - 3 sets - 30 hold - Romberg Stance with Head Rotation  - 1 x daily - 5 x weekly - 2 sets - 10 reps - Standing Single Leg Stance with Counter Support  - 1 x daily - 5 x weekly - 3 sets - 10 hold - Sit to Stand Without Arm Support  - 1 x daily - 7 x weekly - 3 sets - 10 reps  GOALS: Goals reviewed with patient? Yes  SHORT TERM GOALS: Target date: 02/05/2022  Patient will demonstrate 100% compliance with initial HEP to continue to progress between physical therapy sessions.   Baseline: Reported completing rolling exercises a few times since last session but has not performed other HEP work, plans to complete a few times before next session Goal status: IN PROGRESS  2.  Patient will improve FOTO score by 5 points to demonstrate improved function and mobility impacted by dizziness. Baseline: 33, Improved to 44 (11 point improvement) Goal status: MET  3.  Therapist will assess positional testing to assess for BPPV with goal to be added if appropriate in order to progress treatment plan moving forward. Baseline: BPPV Assessed, negative, and no additional goal required Goal status: MET  LONG TERM GOALS: Target date: 02/26/2022   Patient will report demonstrate independence with final HEP in order to  maintain current gains and continue to progress after physical therapy discharge.   Baseline: To be provided Goal status: INITIAL  2.  Patient will improve FOTO score to 47 points to demonstrate improved function and mobility impacted by dizziness. Baseline: 33; Improved to 44 (11 point improvement) Goal status: INITIAL  3.  Patient will improve MSQ score to 40% or less to indicate a reduction in motion sensitivity.  Baseline: Reports dizziness with all movements  Goal status: REVISED  ASSESSMENT:  CLINICAL IMPRESSION:  Session continued to emphasize dynamic movements with emphasis on sustained amplitude during functional balance tasks with integration of vestibular challenges and visual scanning with careful consideration for modifications as needed for spinal precautions. Patient demonstrated ~6-8x festination of steps throughout activities and required min A from therapist to regain balance. Therapist educated on importance of AD given current limitations with possibility of trial for RW next session. Patient and patient's husband verbalized greatest interest thus far. Patient will benefit from skilled physical therapy services to address deficits and improve safety.   OBJECTIVE IMPAIRMENTS: Abnormal gait, decreased balance, decreased mobility, difficulty walking, dizziness, and impaired vision/preception.   ACTIVITY LIMITATIONS: bending, sitting, standing, stairs, and transfers  PARTICIPATION LIMITATIONS: interpersonal relationship, driving, and community activity  PERSONAL FACTORS: Age, Transportation, and 3+ comorbidities: see above for details including PD, sclerosis, and  are also affecting patient's functional outcome.   REHAB POTENTIAL: Good  CLINICAL DECISION MAKING: Evolving/moderate complexity  EVALUATION COMPLEXITY: Moderate   PLAN:  PT FREQUENCY: 2x/week  PT DURATION: 6 weeks  PLANNED INTERVENTIONS: Therapeutic exercises, Therapeutic activity, Neuromuscular  re-education, Balance training, Gait training, Patient/Family education, Self Care, Vestibular training, and Canalith repositioning  PLAN FOR NEXT SESSION: large amplitude dynamic movements with spinal precautions; updated HEP, trial rollator  Malachi Carl, PT, DPT 02/11/22 1:38 PM

## 2022-02-13 ENCOUNTER — Ambulatory Visit (INDEPENDENT_AMBULATORY_CARE_PROVIDER_SITE_OTHER): Payer: Medicare Other | Admitting: Pulmonary Disease

## 2022-02-13 ENCOUNTER — Encounter (HOSPITAL_BASED_OUTPATIENT_CLINIC_OR_DEPARTMENT_OTHER): Payer: Self-pay | Admitting: Pulmonary Disease

## 2022-02-13 VITALS — BP 100/60 | HR 84 | Ht <= 58 in | Wt 126.6 lb

## 2022-02-13 DIAGNOSIS — D869 Sarcoidosis, unspecified: Secondary | ICD-10-CM

## 2022-02-13 NOTE — Progress Notes (Signed)
Subjective:   PATIENT ID: Destiny Roth: female DOB: 1950/10/31, MRN: [id number]   HPI  Chief Complaint  Patient presents with   Follow-up    Not doing well    Reason for Visit: Follow-up  [name] is a 72 year old with Parkinson's disease, HTN, HLD and restrictive lung disease who presents for follow-up for sarcoid management.  Synopsis:  Initially seen as an inpatient Pulmonary consult by me on 02/23/19 for hypercalcemia. Bone biopsy was performed and demonstrated granulomas disease without necrosis. She was started on steroids and discharged with Endocrine and Pulmonary follow-up. Of note, she was previously seen in our Pulmonary clinic in 2018 for dyspnea and mild sleep apnea. Her prior PFTs demonstrate mild restrictive defect with normal DLCO which patient stated was attributed to her Parkinson's disease.  2021 - She is currently on tacrolimus 1 mg BID, methotrexate and prednisone. She continues to have fatigue and visual blurriness. Her dyspnea also remains unchanged. Worsens with exertion. Tacrolimus was discontinued by ophthalmology due tremors. Started on CellCept in November 2021.  2022 - Weaned off methotrexate. She still continues to have issues with fatigue, tremors and word finding after switching to Cellcept. When seen by her neurologist, she reports that her Parkinson's is stable. She was referred to Dr. Harriette Ohara to evaluate for neuro involvement of her sarcoid. Ophthalmology appt 03/14/20 ocular exam shows stable inflammation. Her ophthalmology note from 05/16/20 she has requested stopping taking immunosuppressants to see if these are the cause of her symptoms of fatigue, memory and tremor issues. Her fatigue and tremors have improved off the cellcept however it remains persistent. Unclear if her memory is better off cellcept. Started on Humira on 07/11/20. Started on Amantadine for progressive dyskinesia related to her Parkinsons. She is unsure if  the inhaler helps but she is compliant with Incruse daily. She was seen by Neurology and gabapentin increased for newly diagnosed small fiber neuropathy. She reports that her vision is still distorted and occurs in the day but worsens at night.  2023 - Ophthalmology noted by Dr. Manuella Ghazi on 01/16/21 reviewed with no signs of active inflammation on eye exam. She reports that she is struggling with her lower extremity neuropathy which worsens her balance. She has been compliant with methotrexate and Humira. She is potentially planning for spine surgery (C and L). She is having some hair loss that started with methotrexate use. She reports her vision is unchanged.  12/14/21 She is having balance issues and currently in physical therapy. Presents with her husband. She reports her breathing is still labored. No longer feels tight. Notices more mouth breathing. Stable productive cough daily in the morning and evening. After her surgery in October 2nd, she resumed her humira and methotrexate  02/13/22 Since our last visit her Parkinson's has progressed and needing more support with dizziness. Husband provides support. She reports shortness of breath that is unchanged and labored with exertion. Unchanged chronic productive cough. Interested in coming off methotrexate if this is an option. Discontinued Incruse due to cankar sores  Social History: Never smoker Previously Librarian, academic for admission at [employer]   Past Medical History:  Diagnosis Date   Abdominal pain, epigastric 09/18/2009   Anosmia 11/06/2010   ANXIETY 09/23/2006   BUNION, RIGHT FOOT 09/18/2009   CARPAL TUNNEL SYNDROME, BILATERAL 09/23/2006   DEPRESSION 09/23/2006   DIVERTICULOSIS, COLON 09/23/2006   GERD 05/24/2008   GLUCOSE INTOLERANCE 05/24/2008   Hemorrhoids    Hypercalcemia    HYPERLIPIDEMIA 09/23/2006  HYPERTENSION 09/23/2006   IBS 09/23/2006   Impaired glucose tolerance 11/04/2010   OSTEOPOROSIS 05/24/2008   06/01/19-pt states osteopenia    Parkinson's disease    Restrictive lung disease 12/20/2013   Sarcoidosis 02/26/2019      Outpatient Medications Prior to Visit  Medication Sig Dispense Refill   acetaminophen (TYLENOL) 500 MG tablet Take 500 mg by mouth every 6 (six) hours as needed for mild pain.     Adalimumab (HUMIRA PEN) 40 MG/0.8ML PNKT Inject 0.4 mLs into the skin every 14 (fourteen) days.     Amantadine HCl 100 MG tablet Take 100 mg by mouth in the morning, at noon, and at bedtime.     aspirin EC 81 MG tablet Take 81 mg by mouth daily.     Biotin 1 MG CAPS Take 1 mg by mouth daily at 2 PM. 30 capsule 5   carbidopa-levodopa (SINEMET IR) 25-100 MG tablet Take 0.5-1 tablets by mouth See admin instructions. Take 1 tablet by mouth in the morning and then take half tablet by mouth at noon, and 4 pm and at bedtime per patient     Carbidopa-Levodopa ER (SINEMET CR) 25-100 MG tablet controlled release Take 1 tablet by mouth 2 (two) times daily.     folic acid (FOLVITE) 1 MG tablet TAKE 1 TABLET BY MOUTH EVERY DAY 90 tablet 2   gabapentin (NEURONTIN) 100 MG capsule Take 200 mg by mouth 3 (three) times daily.     memantine (NAMENDA) 5 MG tablet Take 5 mg by mouth. Patient has not started it yet as she wants to know more about it     methotrexate (RHEUMATREX) 2.5 MG tablet TAKE 4 TABLETS (10 MG TOTAL) BY MOUTH ONCE A WEEK. CAUTION:CHEMOTHERAPY. PROTECT FROM LIGHT. 48 tablet 1   rOPINIRole (REQUIP) 1 MG tablet Take 1 tablet (1 mg total) by mouth 3 (three) times daily. 90 tablet 0   rosuvastatin (CRESTOR) 10 MG tablet TAKE 1 TABLET BY MOUTH EVERY DAY 90 tablet 1   senna (SENOKOT) 8.6 MG TABS tablet Take 1 tablet (8.6 mg total) by mouth 2 (two) times daily. 30 tablet 0   sertraline (ZOLOFT) 100 MG tablet Take 1 tablet by mouth daily.     ondansetron (ZOFRAN) 4 MG tablet Take 1 tablet (4 mg total) by mouth every 8 (eight) hours as needed for nausea or vomiting. (Patient not taking: Reported on 02/13/2022) 20 tablet 0   umeclidinium  bromide (INCRUSE ELLIPTA) 62.5 MCG/ACT AEPB Inhale 1 puff into the lungs daily. (Patient not taking: Reported on 02/13/2022) 30 each 5   No facility-administered medications prior to visit.    Review of Systems  Constitutional:  Negative for chills, diaphoresis, fever, malaise/fatigue and weight loss.  HENT:  Negative for congestion.   Respiratory:  Positive for shortness of breath. Negative for cough, hemoptysis, sputum production and wheezing.   Cardiovascular:  Negative for chest pain, palpitations and leg swelling.     Objective:   Vitals:   02/13/22 1503  BP: 100/60  Pulse: 84  SpO2: 95%  Weight: 126 lb 9.6 oz (57.4 kg)  Height: '4\' 10"'$  (1.473 m)   SpO2: 95 % O2 Device: None (Room air)  Physical Exam: General: Well-appearing, no acute distress HENT: [place], AT Eyes: EOMI, no scleral icterus Respiratory: Clear to auscultation bilaterally.  No crackles, wheezing or rales Cardiovascular: RRR, -M/R/G, no JVD Extremities:-Edema,-tenderness Neuro: AAO x4, CNII-XII grossly intact Psych: Normal mood, normal affect  Data Reviewed:  Imaging: CXR 02/23/19 - interval  resolutions of bilateral pleural effusions CT Chest 02/09/19 - Bilateral pleural effusions. Mild ground glass opacifications bilaterally. No enlarged mediastinal or hilar adenopathy. PET Myocardial 01/07/20 - No evidence of increased uptake PET/CT 01/07/20 1. Bilateral ground-glass pulmonary opacities demonstrate mild-moderate FDG avidity. These findings are nonspecific and may be infectious or inflammatory.2. Mild FDG activity of non-enlarged bilateral hilar lymph nodes is also nonspecific.  This could be reactive in the setting of the pulmonary findings, but activity related to sarcoid cannot be excluded.  CXR 03/30/20 - No infiltrate, effusion or edema.  PET Myocardial 03/09/21 - Persistent FDG avid bilateral hilar lymphadenopathy. New FDG avid prevascular lymph node. Increased bilateral FDG avid groundglass pulmonary  opacities. These remain nonspecific and are again favored to be infectious/ inflammatory etiology. No evidence of active cardiac sarcoidosis.   PFT: 12/17/13 FVC 2.26 (86%) FEV1 1.88 (93%) Ratio 78  TLC 70% DLCO 75% Interpretation: Mild restrictive defect with mild reduction in gas exchange (uncorrected DLCO)  06/22/19 FVC 2.07 (95%) FEV1 1.75 (107%) Ratio 80  TLC 91% DLCO 63% Interpretation: No obstructive or restrictive defect. Mild reduction in gas exchange   06/10/20 FVC 2.11 (99%) FEV1 1.8 (113%) Ratio 82  TLC 132% RV 162% RV/TLC 130% DLCO 94% Interpretation: Normal spirometry and gas exchange. Compared to prior PFTs, patient has had increased lung volumes suggestive of air trapping. No significant bronchodilator response however does not preclude benefit. Clinically correlate  09/12/20 FVC 2.17 (102%) FEV1 1.87 (117%) Ratio 86  TLC 105% DLCO 88% Interpretation: Normal PFTs. Air trapping has resolved.  Echocardiogram: 04/22/19 - EF 60-65%. Grade I DD. No WMA or valvular abnormalities.  CBC    Latest Ref Rng & Units 02/04/2022    1:38 PM 10/31/2021    9:18 PM 10/19/2021    3:30 PM  CBC  WBC 4.0 - 10.5 K/uL 7.0  7.8  5.4   Hemoglobin 12.0 - 15.0 g/dL 13.3  12.0  13.8   Hematocrit 36.0 - 46.0 % 41.1  35.2  42.0   Platelets 150.0 - 400.0 K/uL 340.0  221  238   Normal blood counts     Latest Ref Rng & Units 02/04/2022    1:38 PM 10/31/2021    9:18 PM 10/19/2021    3:30 PM  BMP  Glucose 70 - 99 mg/dL 108  129  141   BUN 6 - 23 mg/dL '27  12  23   '$ Creatinine 0.40 - 1.20 mg/dL 1.26  1.01  1.30   Sodium 135 - 145 mEq/L 139  136  138   Potassium 3.5 - 5.1 mEq/L 4.0  3.7  4.6   Chloride 96 - 112 mEq/L 102  102  105   CO2 19 - 32 mEq/L '27  23  25   '$ Calcium 8.4 - 10.5 mg/dL 10.0  9.1  9.7   Creatinine overall stable     Latest Ref Rng & Units 02/04/2022    1:38 PM 10/31/2021    9:18 PM 09/20/2021    4:12 PM  Hepatic Function  Total Protein 6.0 - 8.3 g/dL 8.0  6.7  7.7   Albumin 3.5 -  5.2 g/dL 4.1  3.3  4.5   AST 0 - 37 U/L 19  62  21   ALT 0 - 35 U/L '7  11  7   '$ Alk Phosphatase 39 - 117 U/L 76  54  64   Total Bilirubin 0.2 - 1.2 mg/dL 0.3  0.8  0.5   Normalized LFTs  Latest Ref Rng & Units 06/30/2020    4:14 PM  Quantiferon TB Gold  Quantiferon TB Gold Plus NEGATIVE NEGATIVE    Labs overall stable  Assessment & Plan:   Discussion: 72 year old female with sarcoid, Parkinson's disease who presents for follow-up for sarcoid flare involving eyes and lungs. Stable/unchanged pulmonary symptoms. Patient concerned regarding side effects of medication. Counseled heavily on recurrence of symptoms if we decide to titrate off immunosuppression. Ophthalmology note in 11/203 with improved exam but not quiescent disease. After discussion with patient understanding risk of recurrence of flare, will plan to wean methotrexate as noted below with low threshold to tirate back up if evidence of worsening symptoms (respiratory and/or eye exam).  Sarcoid with pulm, ocular, renal, bone and small fiber neuropathy involvement --Dx 02/26/19 vis bone biopsy at [facility] --Last PFT 08/2020: Normal spirometry --Echo reviewed from 03/2019 with grade I DD. EKG 03/2020 with unchanged prolonged PR interval   --Sarcoid PET 12/2019 - negative for cardiac involvement. Suggests pulmonary involvement --Sarcoid PET 03/09/21 - active pulmonary involvement. Neg cardiac involvement.  --Trend CBC and CMP q 3 months. Will monitor TB gold annually.  High risk medication management --Completed prednisone taper 03/2020 --Started methotrexate 04/2020 --Started cellcept 07/2020. Methotrexate concurrently weaned d/t intolerance --Prednisone taper started with cellcept discontinuation d/t intolerance 04/2020 --Immunosuppressant holiday  --Started Humira on 07/11/20 for persistent ocular symptoms --S/p prednisone taper from Dec 2022 to Jan 2023 --Readded methotrexate 02/2021 --Plan to tirate methotrexate off. 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 Message with Dr. Manuella Ghazi regarding titration plan --Immunosuppressants as above --Reviewed Neuro notes 12/28/20 - visual distortions may be related to Parkinson's  Shortness of breath secondary to sarcoid, deconditioning - improved on prednisone taper however persistent  PFT 08/2020  normal. 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 Johnney Ou. Will CC notes --Will need to resume monthly labs if restarted on Cellcept  History of hypercalcemia secondary to sarcoid - no recurrence --Serial monitoring --Management as above --Patient on vitamin D supplementation  Small fiber neuropathy secondary to sarcoid Parkinson's disease  --MRI Brain 03/2019 neg for neurosarcoid --Spine MRI 02/2020 neg for spinal sarcoid  --Follow[facility] by [facility]: Dr. Lezlie Octave for Parkinson's  --Followed [facility] [name]. On gabapentin --Started on Namenda  Hair loss/alopecia --CONTINUE biotin 1 mg daily  Health Maintenance Immunization History  Administered Date(s) Administered   Influenza Split 12/06/2019   Influenza, High Dose Seasonal PF 11/30/2015, 11/16/2016, 10/29/2017, 10/30/2018, 12/09/2018, 10/31/2020   Influenza, Seasonal, Injecte, Preservative Fre 10/20/2012   Influenza,inj,Quad PF,6+ Mos 11/18/2013   Influenza-Unspecified 11/18/2013, 11/30/2014, 10/29/2017, 10/29/2018   Moderna Sars-Covid-2 Vaccination 03/12/2019, 04/09/2019, 12/06/2019, 05/18/2020   Pfizer Covid-19 Vaccine Bivalent Booster 87yr & up 06/05/2021   Pneumococcal Conjugate-13 01/01/2013,  01/05/2014   Pneumococcal Polysaccharide-23 01/01/2013, 01/05/2014, 02/25/2020   Tdap 11/06/2010, 05/01/2021   Zoster Recombinat (Shingrix) 01/10/2018, 03/31/2018   Zoster, Live 11/06/2010, 01/15/2018, 03/20/2018   CT Lung Screen - not qualified  No orders of the defined types were placed in this encounter.  No orders of the defined types were placed in this encounter.  No  follow-ups on file. March  I have spent a total time of 50-minutes on the day of the appointment including chart review, data review, collecting history, coordinating care and discussing medical diagnosis and plan with the patient/family. Past medical history, allergies, medications were reviewed. Pertinent imaging, labs and tests included in this note have been reviewed and interpreted independently by me.   Riner, MD [facility] 02/13/2022 7:58 PM  Office Number [phone]

## 2022-02-13 NOTE — Patient Instructions (Addendum)
High risk management --Plan to tirate methotrexate off. Will message pharmacy team  >Wean 2.5 mg every two weeks until off  >Discussed if symptoms worsen, we will step back up to the prior dosing and remain on it  >No changes with Humira  Shortness of breath - stable  --STOP Incruse due to canker sores  CKD secondary to presumed sarcoid  - stable --Continue follow-up with [facility]: Dr. Johnney Ou. Will CC notes  Ocular sarcoid --Remain on Humira. Will CC Dr. Manuella Ghazi  Follow up with me in March 2024

## 2022-02-14 DIAGNOSIS — G20A1 Parkinson's disease without dyskinesia, without mention of fluctuations: Secondary | ICD-10-CM | POA: Diagnosis not present

## 2022-02-14 DIAGNOSIS — D869 Sarcoidosis, unspecified: Secondary | ICD-10-CM | POA: Diagnosis not present

## 2022-02-14 DIAGNOSIS — R41841 Cognitive communication deficit: Secondary | ICD-10-CM | POA: Diagnosis not present

## 2022-02-15 ENCOUNTER — Ambulatory Visit: Payer: Medicare Other | Admitting: Physical Therapy

## 2022-02-15 ENCOUNTER — Encounter: Payer: Self-pay | Admitting: Physical Therapy

## 2022-02-15 VITALS — BP 108/83 | HR 86

## 2022-02-15 DIAGNOSIS — R42 Dizziness and giddiness: Secondary | ICD-10-CM | POA: Diagnosis not present

## 2022-02-15 DIAGNOSIS — M5451 Vertebrogenic low back pain: Secondary | ICD-10-CM | POA: Diagnosis not present

## 2022-02-15 DIAGNOSIS — R269 Unspecified abnormalities of gait and mobility: Secondary | ICD-10-CM | POA: Diagnosis not present

## 2022-02-15 DIAGNOSIS — R2689 Other abnormalities of gait and mobility: Secondary | ICD-10-CM | POA: Diagnosis not present

## 2022-02-15 DIAGNOSIS — R2681 Unsteadiness on feet: Secondary | ICD-10-CM

## 2022-02-15 DIAGNOSIS — D869 Sarcoidosis, unspecified: Secondary | ICD-10-CM | POA: Diagnosis not present

## 2022-02-15 NOTE — Therapy (Signed)
OUTPATIENT PHYSICAL THERAPY VESTIBULAR TREATMENT  Patient Name: Destiny Roth MRN: [id number] DOB:1950-06-11, 72 y.o., female Today's Date: 02/15/2022  END OF SESSION:  PT End of Session - 02/15/22 1232     Visit Number 8    Number of Visits 13    Date for PT Re-Evaluation 02/26/22    Authorization Type Medicare Part A & B    PT Start Time 1231    PT Stop Time 1312    PT Time Calculation (min) 41 min    Equipment Utilized During Treatment Gait belt    Activity Tolerance Patient tolerated treatment well    Behavior During Therapy WFL for tasks assessed/performed              Past Medical History:  Diagnosis Date   Abdominal pain, epigastric 09/18/2009   Anosmia 11/06/2010   ANXIETY 09/23/2006   BUNION, RIGHT FOOT 09/18/2009   CARPAL TUNNEL SYNDROME, BILATERAL 09/23/2006   DEPRESSION 09/23/2006   DIVERTICULOSIS, COLON 09/23/2006   GERD 05/24/2008   GLUCOSE INTOLERANCE 05/24/2008   Hemorrhoids    Hypercalcemia    HYPERLIPIDEMIA 09/23/2006   HYPERTENSION 09/23/2006   IBS 09/23/2006   Impaired glucose tolerance 11/04/2010   OSTEOPOROSIS 05/24/2008   06/01/19-pt states osteopenia   Parkinson's disease    Restrictive lung disease 12/20/2013   Sarcoidosis 02/26/2019   Past Surgical History:  Procedure Laterality Date   ABDOMINAL EXPOSURE N/A 10/29/2021   Procedure: LATERAL EXPOSURE FOR OBLIQUE SPINE SURGERY;  Surgeon: Marty Heck, MD;  Location: [place];  Service: Vascular;  Laterality: N/A;   CARDIAC CATHETERIZATION     CATARACT EXTRACTION     COLONOSCOPY  2008   COLONOSCOPY W/ BIOPSIES  2008   Dr. Collene Mares -negative random bxs   ESOPHAGOGASTRODUODENOSCOPY  2008   Dr. Harriette Bouillon duodenal ulcer   FOOT SURGERY Bilateral 2015   LEFT AND RIGHT HEART CATHETERIZATION WITH CORONARY ANGIOGRAM N/A 11/09/2013   Procedure: LEFT AND RIGHT HEART CATHETERIZATION WITH CORONARY ANGIOGRAM;  Surgeon: Peter M Martinique, MD;  Location: [facility] CATH LAB;  Service: Cardiovascular;  Laterality: N/A;    nasal septum repair     OBLIQUE LUMBAR INTERBODY FUSION 1 LEVEL WITH PERCUTANEOUS SCREWS N/A 10/29/2021   Procedure: OBLIQUE LUMBAR INTERBODY FUSION WITH SUPPLEMENTAL PEDICLE SCREW FIXATION LUMBAR FOUR TO FIVE;  Surgeon: Melina Schools, MD;  Location: [place];  Service: Orthopedics;  Laterality: N/A;   s/p bilat CTS     s/p fibroid ablation     s/p ganglion cyst right wrist     s/p right thumb trigger finger surgury     SHOULDER SURGERY Right 2015   TUBAL LIGATION     UPPER GASTROINTESTINAL ENDOSCOPY     Patient Active Problem List   Diagnosis Date Noted   S/P lumbar fusion 10/29/2021   Small fiber neuropathy 11/20/2020   Lumbar radiculopathy 06/28/2020   Chronic neck pain 06/20/2020   Degenerative spondylolisthesis 06/20/2020   High risk medication use 04/27/2020   Panuveitis of both eyes 04/27/2020   Parkinson's disease 09/19/2019   Optic nerve edema 07/06/2019   Peripheral focal chorioretinal inflammation of both eyes 07/06/2019   Retinal edema 07/06/2019   Therapeutic drug monitoring 05/26/2019   Visual disturbance 04/27/2019   Sarcoidosis 02/26/2019   Hypercalcemia 02/04/2019   Generalized weakness 02/04/2019   Left-sided vestibular weakness 07/24/2017   Moderate episode of recurrent major depressive disorder ([place]) 05/28/2017   RBD (REM behavioral disorder) 10/16/2016   Chronic depression 10/16/2016   Fatigue 10/16/2016   Vertigo  03/12/2016   Pulmonary air trapping 02/24/2015   DOE (dyspnea on exertion) 11/18/2013   Hot flashes 11/18/2013   Memory impairment 08/04/2013   Orthostasis 02/26/2013   Tachycardia 02/24/2013   Coronary artery calcification seen on CAT scan 01/01/2013   Tendonitis, calcific, shoulder 01/01/2013   OSA (obstructive sleep apnea) 01/01/2013   PD (Parkinson's disease) ([place]) 11/26/2011   Hematochezia 11/09/2011   Colon polyps 11/09/2011   IBS (irritable bowel syndrome) 11/09/2011   Anosmia 11/06/2010   Impaired glucose tolerance 11/04/2010    Hearing loss 05/07/2010   Preventative health care 05/07/2010   BUNION, RIGHT FOOT 09/18/2009   GERD 05/24/2008   OSTEOPOROSIS 05/24/2008   Hyperlipidemia 09/23/2006   Anxiety state 09/23/2006   Depression 09/23/2006   CARPAL TUNNEL SYNDROME, BILATERAL 09/23/2006   Essential hypertension 09/23/2006   DIVERTICULOSIS, COLON 09/23/2006   Carpal tunnel syndrome, bilateral 09/23/2006    PCP: Dr Leeroy Cha REFERRING PROVIDER: Danie Binder, FNP  REFERRING DIAG: R42 (ICD-10-CM) - Dizziness and giddiness R26.89 (ICD-10-CM) - Other abnormalities of gait and mobility  THERAPY DIAG:  Dizziness and giddiness  Unsteadiness on feet  ONSET DATE: 12/27/2021 (referral date)  Rationale for Evaluation and Treatment: Rehabilitation  SUBJECTIVE:   SUBJECTIVE STATEMENT: Feeling more lightheaded today. Haven't been feeling well that past week. Thinks its because of weaning off the Methotrexate. No falls.    Pt accompanied by: significant other - husband [name]  PERTINENT HISTORY: Sarcodosis, small fiber neuropathy, lumbar fusion on October 2, Parkinson's Disease diangosed in 2018; see medical chart for additional details  PAIN:  Are you having pain? Yes - Back pain, took some Tylenol. Not bad in sitting, but when doing things at the house was a 7/10.    PRECAUTIONS: Back and Fall  WEIGHT BEARING RESTRICTIONS: No  FALLS: Has patient fallen in last 6 months? Yes. Number of falls 1 - denies any injuries with falls but had mistep into sunken living room due to dizziness  Previous falls a few years ago when she cut her head.   LIVING ENVIRONMENT: Lives with: lives with their family Lives in: House/apartment Stairs: 2 steps into the living room, 4 steps into house with a handrail Has following equipment at home: Single point cane and Walker - 2 wheeled  PLOF: Independent  PATIENT GOALS: "To get rid or improve this feeling of dizziness."  OBJECTIVE:    Vitals:    02/15/22 1238  BP: 108/83  Pulse: 86      VESTIBULAR TREATMENT:                                                                                                     GAIT: Gait pattern: step through pattern, decreased stride length, and trunk flexed Distance walked: 62' with RW, 230' and 115' with Rollator  Assistive device utilized: Environmental consultant - 2 wheeled and Walker - 4 wheeled Level of assistance: SBA Comments: Pt's husband brought in pt's RW from home. With front-wheeled RW, pt reporting more difficulty with turning. Pt able to demo improved posture and step length.   With rollator: Cued for  posture, staying close to rollator and incr step length. Pt did well with these cues and pt reports preferring the rollator to the RW due to improved ease of turning and feeling better balance. Went over the benefits over each. Educated on proper brake management prior to sit <> stand transfers and how to use the brakes to slow down gait speed when ambulating. Cued for proper UE position for sit<> stands instead of pressing up with rollator.    Therapeutic Activity: Went over benefits of Rollator vs. RW and use of an AD for safety to assist with balance and to decr fall risk. Pt preferring the rollator and wanting to pursue one Showed how to fold up rollator to place it in the car and how to adjust handles Showed pt how to properly back up rollator to a sturdy surface (such as a wall or sturdy piece of furniture/chair or have husband with her) and to sit down in rollator chair in case pt is fatigued. Educated on steps for Aeronautical engineer. Pt able to perform x3 reps with supervision. Educated on how to obtain a rollator - either through insurance or purchase from [employer]. Pt interested in purchasing from [employer]. Printed out handouts of 2 different rollator options that would work well for pt based on what was used in clinic today.   PATIENT EDUCATION: Education details: See therapeutic  activity section above. Person educated: Patient and Spouse Education method: Explanation, Demonstration, and Handouts Education comprehension: verbalized understanding and returned demonstration  HOME EXERCISE PROGRAM: Rolling    With pillow under head, start on back. Roll slowly to right. Hold position until symptoms subside. Roll slowly onto left side. Hold position until symptoms subside. Repeat sequence 10 times per session. Do 2 sessions per day.  VOR x 1 viewing 1 x 3x day for 60" (horizontal and vertical - printout from [employer] website  Access Code: [email or web address] URL: [email or web address] Date: 02/08/2022 Prepared by: Malachi Carl  Exercises - Standing Balance with Eyes Closed  - 1 x daily - 5 x weekly - 3 sets - 30 hold - Romberg Stance with Head Rotation  - 1 x daily - 5 x weekly - 2 sets - 10 reps - Standing Single Leg Stance with Counter Support  - 1 x daily - 5 x weekly - 3 sets - 10 hold - Sit to Stand Without Arm Support  - 1 x daily - 7 x weekly - 3 sets - 10 reps  GOALS: Goals reviewed with patient? Yes  SHORT TERM GOALS: Target date: 02/05/2022  Patient will demonstrate 100% compliance with initial HEP to continue to progress between physical therapy sessions.   Baseline: Reported completing rolling exercises a few times since last session but has not performed other HEP work, plans to complete a few times before next session Goal status: IN PROGRESS  2.  Patient will improve FOTO score by 5 points to demonstrate improved function and mobility impacted by dizziness. Baseline: 33, Improved to 44 (11 point improvement) Goal status: MET  3.  Therapist will assess positional testing to assess for BPPV with goal to be added if appropriate in order to progress treatment plan moving forward. Baseline: BPPV Assessed, negative, and no additional goal required Goal status: MET  LONG TERM GOALS: Target date: 02/26/2022   Patient will report demonstrate  independence with final HEP in order to maintain current gains and continue to progress after physical therapy discharge.   Baseline: To be provided Goal status: INITIAL  2.  Patient will improve FOTO score to 47 points to demonstrate improved function and mobility impacted by dizziness. Baseline: 33; Improved to 44 (11 point improvement) Goal status: INITIAL  3.  Patient will improve MSQ score to 40% or less to indicate a reduction in motion sensitivity.  Baseline: Reports dizziness with all movements Goal status: REVISED  ASSESSMENT:  CLINICAL IMPRESSION: Today's skilled session focused  on trialing a rollator vs. RW  for improved stability/independence with gait and to decr fall risk. Pt's husband brought in pt's RW from home. Pt able to ambulate with supervision, but did find it harder to turn. Trialed rollator with pt able to demo improved posture, step length and able to ambulate with supervision. Pt preferred the rollator and went over ways to obtain. Pt to prefer to purchase one from [employer]. Will need further practice at next session to work on obstacle negotiation and outdoor gait. Will continue to progress towards LTGs.   OBJECTIVE IMPAIRMENTS: Abnormal gait, decreased balance, decreased mobility, difficulty walking, dizziness, and impaired vision/preception.   ACTIVITY LIMITATIONS: bending, sitting, standing, stairs, and transfers  PARTICIPATION LIMITATIONS: interpersonal relationship, driving, and community activity  PERSONAL FACTORS: Age, Transportation, and 3+ comorbidities: see above for details including PD, sclerosis, and  are also affecting patient's functional outcome.   REHAB POTENTIAL: Good  CLINICAL DECISION MAKING: Evolving/moderate complexity  EVALUATION COMPLEXITY: Moderate   PLAN:  PT FREQUENCY: 2x/week  PT DURATION: 6 weeks  PLANNED INTERVENTIONS: Therapeutic exercises, Therapeutic activity, Neuromuscular re-education, Balance training, Gait training,  Patient/Family education, Self Care, Vestibular training, and Canalith repositioning  PLAN FOR NEXT SESSION: large amplitude dynamic movements with spinal precautions; updated HEP, did pt get rollator? Work on some obstacles as well.   Janann August, PT, DPT 02/15/22 1:16 PM

## 2022-02-18 ENCOUNTER — Ambulatory Visit: Payer: Medicare Other | Admitting: Physical Therapy

## 2022-02-18 ENCOUNTER — Encounter: Payer: Self-pay | Admitting: Pharmacist

## 2022-02-18 ENCOUNTER — Telehealth: Payer: Self-pay | Admitting: Pharmacist

## 2022-02-18 ENCOUNTER — Encounter: Payer: Self-pay | Admitting: Physical Therapy

## 2022-02-18 VITALS — BP 120/69 | HR 93

## 2022-02-18 DIAGNOSIS — D869 Sarcoidosis, unspecified: Secondary | ICD-10-CM

## 2022-02-18 DIAGNOSIS — R2681 Unsteadiness on feet: Secondary | ICD-10-CM

## 2022-02-18 DIAGNOSIS — M5451 Vertebrogenic low back pain: Secondary | ICD-10-CM | POA: Diagnosis not present

## 2022-02-18 DIAGNOSIS — R269 Unspecified abnormalities of gait and mobility: Secondary | ICD-10-CM

## 2022-02-18 DIAGNOSIS — R2689 Other abnormalities of gait and mobility: Secondary | ICD-10-CM | POA: Diagnosis not present

## 2022-02-18 DIAGNOSIS — R42 Dizziness and giddiness: Secondary | ICD-10-CM | POA: Diagnosis not present

## 2022-02-18 NOTE — Telephone Encounter (Signed)
Error

## 2022-02-18 NOTE — Telephone Encounter (Addendum)
ATC patient to discuss. Unable to reach. Left VM requesting return call. Appears that patient's husband is helping with her care now with progression of Parkinson's  Knox Saliva, PharmD, MPH, BCPS, CPP Clinical Pharmacist (Rheumatology and Pulmonology)    ----- Message from [place], MD sent at 02/13/2022  8:01 PM EST ----- Please contact patient regarding titration plan to wean off methotrexate and ensure sufficient medications. Reasonable to wean 2.5 mg every 2 weeks.

## 2022-02-18 NOTE — Therapy (Signed)
OUTPATIENT PHYSICAL THERAPY VESTIBULAR TREATMENT  Patient Name: Destiny Roth MRN: [id number] DOB:1950/05/31, 72 y.o., female Today's Date: 02/18/2022  END OF SESSION:  PT End of Session - 02/18/22 1236     Visit Number 9    Number of Visits 13    Date for PT Re-Evaluation 02/26/22    Authorization Type Medicare Part A & B    PT Start Time 1236    PT Stop Time 1317    PT Time Calculation (min) 41 min    Equipment Utilized During Treatment Gait belt    Activity Tolerance Patient tolerated treatment well    Behavior During Therapy WFL for tasks assessed/performed              Past Medical History:  Diagnosis Date   Abdominal pain, epigastric 09/18/2009   Anosmia 11/06/2010   ANXIETY 09/23/2006   BUNION, RIGHT FOOT 09/18/2009   CARPAL TUNNEL SYNDROME, BILATERAL 09/23/2006   DEPRESSION 09/23/2006   DIVERTICULOSIS, COLON 09/23/2006   GERD 05/24/2008   GLUCOSE INTOLERANCE 05/24/2008   Hemorrhoids    Hypercalcemia    HYPERLIPIDEMIA 09/23/2006   HYPERTENSION 09/23/2006   IBS 09/23/2006   Impaired glucose tolerance 11/04/2010   OSTEOPOROSIS 05/24/2008   06/01/19-pt states osteopenia   Parkinson's disease    Restrictive lung disease 12/20/2013   Sarcoidosis 02/26/2019   Past Surgical History:  Procedure Laterality Date   ABDOMINAL EXPOSURE N/A 10/29/2021   Procedure: LATERAL EXPOSURE FOR OBLIQUE SPINE SURGERY;  Surgeon: Marty Heck, MD;  Location: [place];  Service: Vascular;  Laterality: N/A;   CARDIAC CATHETERIZATION     CATARACT EXTRACTION     COLONOSCOPY  2008   COLONOSCOPY W/ BIOPSIES  2008   Dr. Collene Mares -negative random bxs   ESOPHAGOGASTRODUODENOSCOPY  2008   Dr. Harriette Bouillon duodenal ulcer   FOOT SURGERY Bilateral 2015   LEFT AND RIGHT HEART CATHETERIZATION WITH CORONARY ANGIOGRAM N/A 11/09/2013   Procedure: LEFT AND RIGHT HEART CATHETERIZATION WITH CORONARY ANGIOGRAM;  Surgeon: Peter M Martinique, MD;  Location: [facility] CATH LAB;  Service: Cardiovascular;  Laterality: N/A;    nasal septum repair     OBLIQUE LUMBAR INTERBODY FUSION 1 LEVEL WITH PERCUTANEOUS SCREWS N/A 10/29/2021   Procedure: OBLIQUE LUMBAR INTERBODY FUSION WITH SUPPLEMENTAL PEDICLE SCREW FIXATION LUMBAR FOUR TO FIVE;  Surgeon: Melina Schools, MD;  Location: [place];  Service: Orthopedics;  Laterality: N/A;   s/p bilat CTS     s/p fibroid ablation     s/p ganglion cyst right wrist     s/p right thumb trigger finger surgury     SHOULDER SURGERY Right 2015   TUBAL LIGATION     UPPER GASTROINTESTINAL ENDOSCOPY     Patient Active Problem List   Diagnosis Date Noted   S/P lumbar fusion 10/29/2021   Small fiber neuropathy 11/20/2020   Lumbar radiculopathy 06/28/2020   Chronic neck pain 06/20/2020   Degenerative spondylolisthesis 06/20/2020   High risk medication use 04/27/2020   Panuveitis of both eyes 04/27/2020   Parkinson's disease 09/19/2019   Optic nerve edema 07/06/2019   Peripheral focal chorioretinal inflammation of both eyes 07/06/2019   Retinal edema 07/06/2019   Therapeutic drug monitoring 05/26/2019   Visual disturbance 04/27/2019   Sarcoidosis 02/26/2019   Hypercalcemia 02/04/2019   Generalized weakness 02/04/2019   Left-sided vestibular weakness 07/24/2017   Moderate episode of recurrent major depressive disorder ([place]) 05/28/2017   RBD (REM behavioral disorder) 10/16/2016   Chronic depression 10/16/2016   Fatigue 10/16/2016   Vertigo  03/12/2016   Pulmonary air trapping 02/24/2015   DOE (dyspnea on exertion) 11/18/2013   Hot flashes 11/18/2013   Memory impairment 08/04/2013   Orthostasis 02/26/2013   Tachycardia 02/24/2013   Coronary artery calcification seen on CAT scan 01/01/2013   Tendonitis, calcific, shoulder 01/01/2013   OSA (obstructive sleep apnea) 01/01/2013   PD (Parkinson's disease) ([place]) 11/26/2011   Hematochezia 11/09/2011   Colon polyps 11/09/2011   IBS (irritable bowel syndrome) 11/09/2011   Anosmia 11/06/2010   Impaired glucose tolerance 11/04/2010    Hearing loss 05/07/2010   Preventative health care 05/07/2010   BUNION, RIGHT FOOT 09/18/2009   GERD 05/24/2008   OSTEOPOROSIS 05/24/2008   Hyperlipidemia 09/23/2006   Anxiety state 09/23/2006   Depression 09/23/2006   CARPAL TUNNEL SYNDROME, BILATERAL 09/23/2006   Essential hypertension 09/23/2006   DIVERTICULOSIS, COLON 09/23/2006   Carpal tunnel syndrome, bilateral 09/23/2006    PCP: Dr Leeroy Cha REFERRING PROVIDER: Danie Binder, FNP  REFERRING DIAG: R42 (ICD-10-CM) - Dizziness and giddiness R26.89 (ICD-10-CM) - Other abnormalities of gait and mobility  THERAPY DIAG:  Dizziness and giddiness  Unsteadiness on feet  Abnormality of gait and mobility  ONSET DATE: 12/27/2021 (referral date)  Rationale for Evaluation and Treatment: Rehabilitation  SUBJECTIVE:   SUBJECTIVE STATEMENT: Patient reports that she woke up feeling okay but has felt worse as the day has gone. She is planning on following up about the rollator over the next week; her husband reports that he wants to try putting wheels on the back of FW walker. Patient reports increased back pain. Patient reports one fall this morning back onto a rug; she felt like she lost her balance but reports no injuries and ability to stand independently. Report increased tinnitus in today's session. Denies dizziness but reports increased imbalance today.   Pt accompanied by: significant other - husband [name]  PERTINENT HISTORY: Sarcodosis, small fiber neuropathy, lumbar fusion on October 2, Parkinson's Disease diangosed in 2018; see medical chart for additional details  PAIN:  Are you having pain? Yes - Back pain, sitting down/laying down helps; putting hands above head when showering/standing for long periods increases pain a 5/10.    PRECAUTIONS: Back and Fall  WEIGHT BEARING RESTRICTIONS: No  FALLS: Has patient fallen in last 6 months? Yes. Number of falls 1 - denies any injuries with falls but had  mistep into sunken living room due to dizziness  Previous falls a few years ago when she cut her head.   LIVING ENVIRONMENT: Lives with: lives with their family Lives in: House/apartment Stairs: 2 steps into the living room, 4 steps into house with a handrail Has following equipment at home: Single point cane and Walker - 2 wheeled  PLOF: Independent  PATIENT GOALS: "To get rid or improve this feeling of dizziness."  OBJECTIVE:    Today's Vitals   02/18/22 1245 02/18/22 1246 02/18/22 1257  BP: (!) 83/58 108/67 120/69  Pulse: 88 85 93   Vital 1: Start of session Vital 2: A few minutes into session Vital 3: After nustep    VESTIBULAR TREATMENT:  GAIT: Gait pattern: step through pattern, decreased stride length, and trunk flexed Distance walked: 800' with RW around obstacles throughout clinic; 1 X 150' with RW  Assistive device utilized: Environmental consultant - 2 wheeled and Walker - 4 wheeled Level of assistance: SBA and CGA Comments: Reviewed instructions from prior session with strong emphasis on navigation around obstacles. Performed x6 figure 8 around chairs. Patient demonstrated reduced awareness of size of RW and bumped into multiple obstacles around rooms also likely attributed to vision challenges. Improved with repetition and min cueing.   Reviewed proper brake management prior to sit <> stand transfers and how to use the brakes to slow down gait speed when ambulating; performed x2 in session. Cued for proper UE position for sit<> stands instead of pressing up with rollator.    Therapeutic Activity:  3 min on Nustep at lvl 3 for coordination with reciprocal pattern and primarily increase BP due to low readings Seated marching and arm pumps 2 x 20 during session to increase BP  PATIENT EDUCATION: Education details: RW review; use of arm pumps/seated marching to increase BP at home Person  educated: Patient and Spouse Education method: Explanation, Demonstration, and Handouts Education comprehension: verbalized understanding and returned demonstration  HOME EXERCISE PROGRAM:  VOR x 1 viewing 1 x 3x day for 60" (horizontal and vertical - printout from [employer] website  Access Code: [email or web address] URL: [email or web address] Date: 02/08/2022 Prepared by: Malachi Carl  Exercises - Standing Balance with Eyes Closed  - 1 x daily - 5 x weekly - 3 sets - 30 hold - Romberg Stance with Head Rotation  - 1 x daily - 5 x weekly - 2 sets - 10 reps - Standing Single Leg Stance with Counter Support  - 1 x daily - 5 x weekly - 3 sets - 10 hold - Sit to Stand Without Arm Support  - 1 x daily - 7 x weekly - 3 sets - 10 reps  GOALS: Goals reviewed with patient? Yes  SHORT TERM GOALS: Target date: 02/05/2022  Patient will demonstrate 100% compliance with initial HEP to continue to progress between physical therapy sessions.   Baseline: Reported completing rolling exercises a few times since last session but has not performed other HEP work, plans to complete a few times before next session Goal status: IN PROGRESS  2.  Patient will improve FOTO score by 5 points to demonstrate improved function and mobility impacted by dizziness. Baseline: 33, Improved to 44 (11 point improvement) Goal status: MET  3.  Therapist will assess positional testing to assess for BPPV with goal to be added if appropriate in order to progress treatment plan moving forward. Baseline: BPPV Assessed, negative, and no additional goal required Goal status: MET  LONG TERM GOALS: Target date: 02/26/2022   Patient will report demonstrate independence with final HEP in order to maintain current gains and continue to progress after physical therapy discharge.   Baseline: To be provided Goal status: INITIAL  2.  Patient will improve FOTO score to 47 points to demonstrate improved function and mobility  impacted by dizziness. Baseline: 33; Improved to 44 (11 point improvement) Goal status: INITIAL  3.  Patient will improve MSQ score to 40% or less to indicate a reduction in motion sensitivity.  Baseline: Reports dizziness with all movements Goal status: REVISED  ASSESSMENT:  CLINICAL IMPRESSION: Today's skilled session focused on continued progression of use a rollator for improved stability/independence with gait and to decr fall risk; strong emphasis placed on navigation  in a busy environment around obstacles. Recommend continued work on Furniture conservator/restorer with rollator as patient bumped into multiple obstacles during session and required mod cues for increased safety awareness. Patient also presents to session with decreased BP so provided education on methods to manage BP at home; initiated session with Nustep to regulate BP and reinforce reciprocal patterned movement. Patient had one fall since last PT session and will benefit from continued skilled physical therapy services to maximize safety in home environment.   OBJECTIVE IMPAIRMENTS: Abnormal gait, decreased balance, decreased mobility, difficulty walking, dizziness, and impaired vision/preception.   ACTIVITY LIMITATIONS: bending, sitting, standing, stairs, and transfers  PARTICIPATION LIMITATIONS: interpersonal relationship, driving, and community activity  PERSONAL FACTORS: Age, Transportation, and 3+ comorbidities: see above for details including PD, sclerosis, and  are also affecting patient's functional outcome.   REHAB POTENTIAL: Good  CLINICAL DECISION MAKING: Evolving/moderate complexity  EVALUATION COMPLEXITY: Moderate   PLAN:  PT FREQUENCY: 2x/week  PT DURATION: 6 weeks  PLANNED INTERVENTIONS: Therapeutic exercises, Therapeutic activity, Neuromuscular re-education, Balance training, Gait training, Patient/Family education, Self Care, Vestibular training, and Canalith repositioning  PLAN FOR NEXT SESSION:  large amplitude dynamic movements with spinal precautions; updated HEP, did pt get rollator? Work on some obstacles/outdoor navigation; saccade exercise for home to transfer to improved navigtion; 10th visit progress note!  Malachi Carl, PT, DPT 02/18/22 2:17 PM

## 2022-02-21 DIAGNOSIS — R41841 Cognitive communication deficit: Secondary | ICD-10-CM | POA: Diagnosis not present

## 2022-02-21 DIAGNOSIS — G20A1 Parkinson's disease without dyskinesia, without mention of fluctuations: Secondary | ICD-10-CM | POA: Diagnosis not present

## 2022-02-21 DIAGNOSIS — D869 Sarcoidosis, unspecified: Secondary | ICD-10-CM | POA: Diagnosis not present

## 2022-02-22 ENCOUNTER — Encounter: Payer: Self-pay | Admitting: Physical Therapy

## 2022-02-22 ENCOUNTER — Ambulatory Visit: Payer: Medicare Other | Admitting: Physical Therapy

## 2022-02-22 VITALS — BP 119/72 | HR 83

## 2022-02-22 DIAGNOSIS — R269 Unspecified abnormalities of gait and mobility: Secondary | ICD-10-CM | POA: Diagnosis not present

## 2022-02-22 DIAGNOSIS — R2681 Unsteadiness on feet: Secondary | ICD-10-CM

## 2022-02-22 DIAGNOSIS — D869 Sarcoidosis, unspecified: Secondary | ICD-10-CM | POA: Diagnosis not present

## 2022-02-22 DIAGNOSIS — R42 Dizziness and giddiness: Secondary | ICD-10-CM

## 2022-02-22 DIAGNOSIS — M5451 Vertebrogenic low back pain: Secondary | ICD-10-CM | POA: Diagnosis not present

## 2022-02-22 DIAGNOSIS — R2689 Other abnormalities of gait and mobility: Secondary | ICD-10-CM | POA: Diagnosis not present

## 2022-02-22 NOTE — Therapy (Signed)
OUTPATIENT PHYSICAL THERAPY VESTIBULAR TREATMENT / PROGRESS NOTE  Patient Name: Destiny Roth MRN: [id number] DOB:July 04, 1950, 72 y.o., female Today's Date: 02/22/2022  END OF SESSION:  PT End of Session - 02/22/22 1237     Visit Number 10    Number of Visits 13    Date for PT Re-Evaluation 02/26/22    Authorization Type Medicare Part A & B    PT Start Time 1337    PT Stop Time 1415    PT Time Calculation (min) 38 min    Equipment Utilized During Treatment Gait belt    Activity Tolerance Patient tolerated treatment well    Behavior During Therapy WFL for tasks assessed/performed              Past Medical History:  Diagnosis Date   Abdominal pain, epigastric 09/18/2009   Anosmia 11/06/2010   ANXIETY 09/23/2006   BUNION, RIGHT FOOT 09/18/2009   CARPAL TUNNEL SYNDROME, BILATERAL 09/23/2006   DEPRESSION 09/23/2006   DIVERTICULOSIS, COLON 09/23/2006   GERD 05/24/2008   GLUCOSE INTOLERANCE 05/24/2008   Hemorrhoids    Hypercalcemia    HYPERLIPIDEMIA 09/23/2006   HYPERTENSION 09/23/2006   IBS 09/23/2006   Impaired glucose tolerance 11/04/2010   OSTEOPOROSIS 05/24/2008   06/01/19-pt states osteopenia   Parkinson's disease    Restrictive lung disease 12/20/2013   Sarcoidosis 02/26/2019   Past Surgical History:  Procedure Laterality Date   ABDOMINAL EXPOSURE N/A 10/29/2021   Procedure: LATERAL EXPOSURE FOR OBLIQUE SPINE SURGERY;  Surgeon: Marty Heck, MD;  Location: [place];  Service: Vascular;  Laterality: N/A;   CARDIAC CATHETERIZATION     CATARACT EXTRACTION     COLONOSCOPY  2008   COLONOSCOPY W/ BIOPSIES  2008   Dr. Collene Mares -negative random bxs   ESOPHAGOGASTRODUODENOSCOPY  2008   Dr. Harriette Bouillon duodenal ulcer   FOOT SURGERY Bilateral 2015   LEFT AND RIGHT HEART CATHETERIZATION WITH CORONARY ANGIOGRAM N/A 11/09/2013   Procedure: LEFT AND RIGHT HEART CATHETERIZATION WITH CORONARY ANGIOGRAM;  Surgeon: Peter M Martinique, MD;  Location: [facility] CATH LAB;  Service: Cardiovascular;   Laterality: N/A;   nasal septum repair     OBLIQUE LUMBAR INTERBODY FUSION 1 LEVEL WITH PERCUTANEOUS SCREWS N/A 10/29/2021   Procedure: OBLIQUE LUMBAR INTERBODY FUSION WITH SUPPLEMENTAL PEDICLE SCREW FIXATION LUMBAR FOUR TO FIVE;  Surgeon: Melina Schools, MD;  Location: [place];  Service: Orthopedics;  Laterality: N/A;   s/p bilat CTS     s/p fibroid ablation     s/p ganglion cyst right wrist     s/p right thumb trigger finger surgury     SHOULDER SURGERY Right 2015   TUBAL LIGATION     UPPER GASTROINTESTINAL ENDOSCOPY     Patient Active Problem List   Diagnosis Date Noted   S/P lumbar fusion 10/29/2021   Small fiber neuropathy 11/20/2020   Lumbar radiculopathy 06/28/2020   Chronic neck pain 06/20/2020   Degenerative spondylolisthesis 06/20/2020   High risk medication use 04/27/2020   Panuveitis of both eyes 04/27/2020   Parkinson's disease 09/19/2019   Optic nerve edema 07/06/2019   Peripheral focal chorioretinal inflammation of both eyes 07/06/2019   Retinal edema 07/06/2019   Therapeutic drug monitoring 05/26/2019   Visual disturbance 04/27/2019   Sarcoidosis 02/26/2019   Hypercalcemia 02/04/2019   Generalized weakness 02/04/2019   Left-sided vestibular weakness 07/24/2017   Moderate episode of recurrent major depressive disorder ([place]) 05/28/2017   RBD (REM behavioral disorder) 10/16/2016   Chronic depression 10/16/2016   Fatigue 10/16/2016  Vertigo 03/12/2016   Pulmonary air trapping 02/24/2015   DOE (dyspnea on exertion) 11/18/2013   Hot flashes 11/18/2013   Memory impairment 08/04/2013   Orthostasis 02/26/2013   Tachycardia 02/24/2013   Coronary artery calcification seen on CAT scan 01/01/2013   Tendonitis, calcific, shoulder 01/01/2013   OSA (obstructive sleep apnea) 01/01/2013   PD (Parkinson's disease) ([place]) 11/26/2011   Hematochezia 11/09/2011   Colon polyps 11/09/2011   IBS (irritable bowel syndrome) 11/09/2011   Anosmia 11/06/2010   Impaired glucose  tolerance 11/04/2010   Hearing loss 05/07/2010   Preventative health care 05/07/2010   BUNION, RIGHT FOOT 09/18/2009   GERD 05/24/2008   OSTEOPOROSIS 05/24/2008   Hyperlipidemia 09/23/2006   Anxiety state 09/23/2006   Depression 09/23/2006   CARPAL TUNNEL SYNDROME, BILATERAL 09/23/2006   Essential hypertension 09/23/2006   DIVERTICULOSIS, COLON 09/23/2006   Carpal tunnel syndrome, bilateral 09/23/2006    PCP: Dr Leeroy Cha REFERRING PROVIDER: Danie Binder, FNP  REFERRING DIAG: R42 (ICD-10-CM) - Dizziness and giddiness R26.89 (ICD-10-CM) - Other abnormalities of gait and mobility  THERAPY DIAG:  Dizziness and giddiness  Unsteadiness on feet  Abnormality of gait and mobility  ONSET DATE: 12/27/2021 (referral date)  Rationale for Evaluation and Treatment: Rehabilitation  SUBJECTIVE:   SUBJECTIVE STATEMENT: Patient reports no falls since last session. Patient reports that she gets nauseous not dizzy when she tries to wash her hair in the shower and frequently throws up. Recommended follow up with PCP. Patient continues to demonstrate noncompliance with recommended rollator. Patient's husbanded added wheels to the back of FWW and will bring it into next session to assess. Patient report that she continues to have some off days more frequently.  Pt accompanied by: significant other - husband [name]  PERTINENT HISTORY: Sarcodosis, small fiber neuropathy, lumbar fusion on October 2, Parkinson's Disease diangosed in 2018; see medical chart for additional details  PAIN:  Are you having pain? Yes - Moderate back pain  PRECAUTIONS: Back and Fall  WEIGHT BEARING RESTRICTIONS: No  FALLS: Has patient fallen in last 6 months? Yes. Number of falls 1 - denies any injuries with falls but had mistep into sunken living room due to dizziness  Previous falls a few years ago when she cut her head.   LIVING ENVIRONMENT: Lives with: lives with their family Lives in:  House/apartment Stairs: 2 steps into the living room, 4 steps into house with a handrail Has following equipment at home: Single point cane and Walker - 2 wheeled  PLOF: Independent  PATIENT GOALS: "To get rid or improve this feeling of dizziness."  OBJECTIVE:    Today's Vitals   02/22/22 1248  BP: 119/72  Pulse: 83   VESTIBULAR TREATMENT:                                                                                                          Motion Sensitivity Quotient Intensity: 0 = none, 1 = Lightheaded, 2 = Mild, 3 = Moderate, 4 = Severe, 5 = Vomiting   Intensity  1. Sitting to supine 1  2. Supine to L side 2  3. Supine to R side 2  4. Supine to sitting 2  5. L Hallpike-Dix 3  6. Up from L  2  7. R Hallpike-Dix 2  8. Up from R  2  9. Sitting, head  tipped to L knee Held due to spinal precautions  10. Head up from L  knee Held due to spinal precautions  11. Sitting, head  tipped to R knee Held due to spinal precautions  12. Head up from R  knee Held due to spinal precautions  13. Sitting head turns x5 2  14.Sitting head nods x5 2  15. In stance, 180  turn to L  2  16. In stance, 180  turn to R 2             Score: 24/60 = 40% impairment  Self Care: Patient education on safety measures including strong emphasis on use of rollator. POC moving forward including     PATIENT EDUCATION: Education details: RW review; use of arm pumps/seated marching to increase BP at home Person educated: Patient and Spouse Education method: Explanation, Demonstration, and Handouts Education comprehension: verbalized understanding and returned demonstration  HOME EXERCISE PROGRAM:  VOR x 1 viewing 1 x 3x day for 60" (horizontal and vertical - printout from [employer] website  Access Code: [email or web address] URL: [email or web address] Date: 02/08/2022 Prepared by: Malachi Carl  Exercises - Standing Balance with Eyes Closed  - 1 x daily - 5 x weekly - 3 sets - 30  hold - Romberg Stance with Head Rotation  - 1 x daily - 5 x weekly - 2 sets - 10 reps - Standing Single Leg Stance with Counter Support  - 1 x daily - 5 x weekly - 3 sets - 10 hold - Sit to Stand Without Arm Support  - 1 x daily - 7 x weekly - 3 sets - 10 reps  GOALS: Goals reviewed with patient? Yes  SHORT TERM GOALS: Target date: 02/05/2022  Patient will demonstrate 100% compliance with initial HEP to continue to progress between physical therapy sessions.   Baseline: Reported completing rolling exercises a few times since last session but has not performed other HEP work, plans to complete a few times before next session Goal status: IN PROGRESS  2.  Patient will improve FOTO score by 5 points to demonstrate improved function and mobility impacted by dizziness. Baseline: 33, Improved to 44 (11 point improvement) Goal status: MET  3.  Therapist will assess positional testing to assess for BPPV with goal to be added if appropriate in order to progress treatment plan moving forward. Baseline: BPPV Assessed, negative, and no additional goal required Goal status: MET  LONG TERM GOALS: Target date: 02/26/2022   Patient will report demonstrate independence with final HEP in order to maintain current gains and continue to progress after physical therapy discharge.   Baseline: To be provided at discharge with final review Goal status: INITIAL  2.  Patient will improve FOTO score to 47 points to demonstrate improved function and mobility impacted by dizziness. Baseline: 33; Improved to 44 (11 point improvement) Goal status: IN PROGRESS  3.  Patient will improve MSQ score to 40% or less to indicate a reduction in motion sensitivity.  Baseline: Reports dizziness with all movements; improved to 40% Goal status: MET  ASSESSMENT:  CLINICAL IMPRESSION: Session emphasized progress towards goals. Patient has achieved 3/6 goals. Requires continued review of final HEP next session to maintain  progress after therapy. Patient improved motion sensitivity to 40% in today's session. Patient continues to be a very high fall risk due to underlying impairments related to Parkinson's and demonstrates non compliance with recommended AD despite repeated recommendation/education from multiple therapist over the course of therapy. Recommend follow up with neurology after therapy. Patient will benefit from skilled physical therapy services to address last minute concerns about HEP and assess safety with AD. Continue POC.  OBJECTIVE IMPAIRMENTS: Abnormal gait, decreased balance, decreased mobility, difficulty walking, dizziness, and impaired vision/preception.   ACTIVITY LIMITATIONS: bending, sitting, standing, stairs, and transfers  PARTICIPATION LIMITATIONS: interpersonal relationship, driving, and community activity  PERSONAL FACTORS: Age, Transportation, and 3+ comorbidities: see above for details including PD, sclerosis, and  are also affecting patient's functional outcome.   REHAB POTENTIAL: Good  CLINICAL DECISION MAKING: Evolving/moderate complexity  EVALUATION COMPLEXITY: Moderate   PLAN:  PT FREQUENCY: 2x/week  PT DURATION: 6 weeks  PLANNED INTERVENTIONS: Therapeutic exercises, Therapeutic activity, Neuromuscular re-education, Balance training, Gait training, Patient/Family education, Self Care, Vestibular training, and Canalith repositioning  PLAN FOR NEXT SESSION: plan to D/C with updated HEP next session; recommend follow up with neurology and PCP about remaining deficits  Malachi Carl, PT, DPT 02/22/22 2:01 PM

## 2022-02-25 ENCOUNTER — Telehealth: Payer: Self-pay | Admitting: Physical Therapy

## 2022-02-25 ENCOUNTER — Encounter: Payer: Self-pay | Admitting: Physical Therapy

## 2022-02-25 ENCOUNTER — Ambulatory Visit: Payer: Medicare Other | Admitting: Physical Therapy

## 2022-02-25 DIAGNOSIS — R42 Dizziness and giddiness: Secondary | ICD-10-CM

## 2022-02-25 DIAGNOSIS — D869 Sarcoidosis, unspecified: Secondary | ICD-10-CM | POA: Diagnosis not present

## 2022-02-25 DIAGNOSIS — M5451 Vertebrogenic low back pain: Secondary | ICD-10-CM | POA: Diagnosis not present

## 2022-02-25 DIAGNOSIS — R2681 Unsteadiness on feet: Secondary | ICD-10-CM | POA: Diagnosis not present

## 2022-02-25 DIAGNOSIS — R2689 Other abnormalities of gait and mobility: Secondary | ICD-10-CM | POA: Diagnosis not present

## 2022-02-25 DIAGNOSIS — R269 Unspecified abnormalities of gait and mobility: Secondary | ICD-10-CM

## 2022-02-25 NOTE — Telephone Encounter (Signed)
Called both patient's neurology clinic in [facility] and LVM for [facility] [name], MD) per patient's request and LVM about progress in physical therapy as well as patient's concern about increase in symptoms over the last year related to Parkinson's and sarcoidosis. Requested follow up from MD if possible to address patient's concerns.   Destiny Roth, PT, DPT

## 2022-02-25 NOTE — Therapy (Signed)
OUTPATIENT PHYSICAL THERAPY VESTIBULAR TREATMENT / DISCHARGE NOTE  Patient Name: Destiny Roth MRN: [id number] DOB:24-Mar-1950, 72 y.o., female Today's Date: 02/25/2022  END OF SESSION:  PT End of Session - 02/25/22 1235     Visit Number 11    Number of Visits 13    Date for PT Re-Evaluation 02/26/22    Authorization Type Medicare Part A & B    PT Start Time 1231    PT Stop Time 1317    PT Time Calculation (min) 46 min    Equipment Utilized During Treatment Gait belt    Activity Tolerance Patient tolerated treatment well    Behavior During Therapy WFL for tasks assessed/performed              Past Medical History:  Diagnosis Date   Abdominal pain, epigastric 09/18/2009   Anosmia 11/06/2010   ANXIETY 09/23/2006   BUNION, RIGHT FOOT 09/18/2009   CARPAL TUNNEL SYNDROME, BILATERAL 09/23/2006   DEPRESSION 09/23/2006   DIVERTICULOSIS, COLON 09/23/2006   GERD 05/24/2008   GLUCOSE INTOLERANCE 05/24/2008   Hemorrhoids    Hypercalcemia    HYPERLIPIDEMIA 09/23/2006   HYPERTENSION 09/23/2006   IBS 09/23/2006   Impaired glucose tolerance 11/04/2010   OSTEOPOROSIS 05/24/2008   06/01/19-pt states osteopenia   Parkinson's disease    Restrictive lung disease 12/20/2013   Sarcoidosis 02/26/2019   Past Surgical History:  Procedure Laterality Date   ABDOMINAL EXPOSURE N/A 10/29/2021   Procedure: LATERAL EXPOSURE FOR OBLIQUE SPINE SURGERY;  Surgeon: Marty Heck, MD;  Location: [place];  Service: Vascular;  Laterality: N/A;   CARDIAC CATHETERIZATION     CATARACT EXTRACTION     COLONOSCOPY  2008   COLONOSCOPY W/ BIOPSIES  2008   Dr. Collene Mares -negative random bxs   ESOPHAGOGASTRODUODENOSCOPY  2008   Dr. Harriette Bouillon duodenal ulcer   FOOT SURGERY Bilateral 2015   LEFT AND RIGHT HEART CATHETERIZATION WITH CORONARY ANGIOGRAM N/A 11/09/2013   Procedure: LEFT AND RIGHT HEART CATHETERIZATION WITH CORONARY ANGIOGRAM;  Surgeon: Peter M Martinique, MD;  Location: [facility] CATH LAB;  Service: Cardiovascular;   Laterality: N/A;   nasal septum repair     OBLIQUE LUMBAR INTERBODY FUSION 1 LEVEL WITH PERCUTANEOUS SCREWS N/A 10/29/2021   Procedure: OBLIQUE LUMBAR INTERBODY FUSION WITH SUPPLEMENTAL PEDICLE SCREW FIXATION LUMBAR FOUR TO FIVE;  Surgeon: Melina Schools, MD;  Location: [place];  Service: Orthopedics;  Laterality: N/A;   s/p bilat CTS     s/p fibroid ablation     s/p ganglion cyst right wrist     s/p right thumb trigger finger surgury     SHOULDER SURGERY Right 2015   TUBAL LIGATION     UPPER GASTROINTESTINAL ENDOSCOPY     Patient Active Problem List   Diagnosis Date Noted   S/P lumbar fusion 10/29/2021   Small fiber neuropathy 11/20/2020   Lumbar radiculopathy 06/28/2020   Chronic neck pain 06/20/2020   Degenerative spondylolisthesis 06/20/2020   High risk medication use 04/27/2020   Panuveitis of both eyes 04/27/2020   Parkinson's disease 09/19/2019   Optic nerve edema 07/06/2019   Peripheral focal chorioretinal inflammation of both eyes 07/06/2019   Retinal edema 07/06/2019   Therapeutic drug monitoring 05/26/2019   Visual disturbance 04/27/2019   Sarcoidosis 02/26/2019   Hypercalcemia 02/04/2019   Generalized weakness 02/04/2019   Left-sided vestibular weakness 07/24/2017   Moderate episode of recurrent major depressive disorder ([place]) 05/28/2017   RBD (REM behavioral disorder) 10/16/2016   Chronic depression 10/16/2016   Fatigue 10/16/2016  Vertigo 03/12/2016   Pulmonary air trapping 02/24/2015   DOE (dyspnea on exertion) 11/18/2013   Hot flashes 11/18/2013   Memory impairment 08/04/2013   Orthostasis 02/26/2013   Tachycardia 02/24/2013   Coronary artery calcification seen on CAT scan 01/01/2013   Tendonitis, calcific, shoulder 01/01/2013   OSA (obstructive sleep apnea) 01/01/2013   PD (Parkinson's disease) ([place]) 11/26/2011   Hematochezia 11/09/2011   Colon polyps 11/09/2011   IBS (irritable bowel syndrome) 11/09/2011   Anosmia 11/06/2010   Impaired glucose  tolerance 11/04/2010   Hearing loss 05/07/2010   Preventative health care 05/07/2010   BUNION, RIGHT FOOT 09/18/2009   GERD 05/24/2008   OSTEOPOROSIS 05/24/2008   Hyperlipidemia 09/23/2006   Anxiety state 09/23/2006   Depression 09/23/2006   CARPAL TUNNEL SYNDROME, BILATERAL 09/23/2006   Essential hypertension 09/23/2006   DIVERTICULOSIS, COLON 09/23/2006   Carpal tunnel syndrome, bilateral 09/23/2006    PCP: Dr Leeroy Cha REFERRING PROVIDER: Danie Binder, FNP  REFERRING DIAG: R42 (ICD-10-CM) - Dizziness and giddiness R26.89 (ICD-10-CM) - Other abnormalities of gait and mobility  THERAPY DIAG:  Dizziness and giddiness  Unsteadiness on feet  Abnormality of gait and mobility  ONSET DATE: 12/27/2021 (referral date)  Rationale for Evaluation and Treatment: Rehabilitation  SUBJECTIVE:   SUBJECTIVE STATEMENT: Patient arrives to session with husband bringing 2WW modified into 4WW. Patient reports that she still feels like over the last year she has seen a decline in her function and wants to follow up with neurology about possible medication modifications. Wants to review final HEP.   Pt accompanied by: significant other - husband Destiny Roth  PERTINENT HISTORY: Sarcodosis, small fiber neuropathy, lumbar fusion on October 2, Parkinson's Disease diangosed in 2018; see medical chart for additional details  PAIN:  Are you having pain? Yes - Moderate back pain  PRECAUTIONS: Back and Fall  WEIGHT BEARING RESTRICTIONS: No  FALLS: Has patient fallen in last 6 months? Yes. Number of falls 1 - denies any injuries with falls but had mistep into sunken living room due to dizziness  Previous falls a few years ago when she cut her head.   LIVING ENVIRONMENT: Lives with: lives with their family Lives in: House/apartment Stairs: 2 steps into the living room, 4 steps into house with a handrail Has following equipment at home: Single point cane and Walker - 2  wheeled  PLOF: Independent  PATIENT GOALS: "To get rid or improve this feeling of dizziness."  OBJECTIVE:     VESTIBULAR TREATMENT:                                                                                                    FOTO: 51.44  TherAct:  Trial of 2WW modified with coasters on back to create 4WW versus rollator  1 x 115' with modified 4WW - patient is safe with SBA reduced ease of turns due to lack of pivoting wheel at front but is able to navigate without error around clinic 1 x 155' with rollator - patient is safe and demonstrates increased ease with navigation around the clinic; requires minA from caregiver for  break locking due to memory deficits  Review of final HEP (see below for details) with education on proper safety measures  Self Care: Education on when to return to physical therapy if notice decline in function. Recommend contineud safety measures and use of DME as advised. Consider P[facility] referral for PT, OT, Speech in a few months if notices decline in function and spinal precautions are lifted.     PATIENT EDUCATION: Education details: review final HEP; see above Person educated: Patient and Spouse Education method: Explanation, Demonstration, and Handouts Education comprehension: verbalized understanding and returned demonstration  HOME EXERCISE PROGRAM:  Access Code: [email or web address] URL: [email or web address] Date: 02/25/2022 Prepared by: Malachi Carl  Exercises - Sit to Stand Without Arm Support  - 1 x daily - 7 x weekly - 3 sets - 10 reps - Seated Left Head Turns Vestibular Habituation  - 1 x daily - 7 x weekly - 3 sets - 10 reps - Seated Head Nods Vestibular Habituation  - 1 x daily - 7 x weekly - 3 sets - 10 reps - Seated Gaze Stabilization with Head Nod  - 1 x daily - 7 x weekly - 3 sets - 60 hold - Seated Gaze Stabilization with Head Nod and Vertical Arm Movement  - 1 x daily - 7 x weekly - 3 sets - 60 hold  GOALS: Goals reviewed  with patient? Yes  SHORT TERM GOALS: Target date: 02/05/2022  Patient will demonstrate 100% compliance with initial HEP to continue to progress between physical therapy sessions.   Baseline: Reported completing rolling exercises a few times since last session but has not performed other HEP work, plans to complete a few times before next session; demonstrates confidence on 02/25/2022 Goal status: MET  2.  Patient will improve FOTO score by 5 points to demonstrate improved function and mobility impacted by dizziness. Baseline: 33, Improved to 44 (11 point improvement); improved to 51.44 on 02/25/2022 Goal status: MET  3.  Therapist will assess positional testing to assess for BPPV with goal to be added if appropriate in order to progress treatment plan moving forward. Baseline: BPPV Assessed, negative, and no additional goal required Goal status: MET  LONG TERM GOALS: Target date: 02/26/2022   Patient will report demonstrate independence with final HEP in order to maintain current gains and continue to progress after physical therapy discharge.   Baseline: To be provided at discharge with final review; demonstrates confidence on 02/25/2022 Goal status: MET  2.  Patient will improve FOTO score to 47 points to demonstrate improved function and mobility impacted by dizziness. Baseline: 33; Improved to 44 (11 point improvement);  improved to 51.44 on 02/25/2022 Goal status: MET  3.  Patient will improve MSQ score to 40% or less to indicate a reduction in motion sensitivity.  Baseline: Reports dizziness with all movements; improved to 40% Goal status: MET  ASSESSMENT:  CLINICAL IMPRESSION: Patient is discharging from physical therapy today after achieving 6/6 goals. Patient demonstrates independence with HEP. Patient also demonstrated functional change on FOTO above the predicted value of 47 points to 51.44 points at discharge. Patient continues to express concern about medical management  outside of physical therapy related to decline in function over the last year and response to medication. Advised follow up with neurology to address and possible referral back to physical therapy in several months if notices reduction in function. Patient would be a good canidate for PWR in the future once spinal precautions are lifted. Patient continues as a patient  at [facility] to address s/p back impairments. Recommended rollator for patient but modified 4WW was also safe when used during session if they are unable to purchase one. Patient has achieved maximal therapuetic potential at this time and is discharging from skilled physical therapy services addressing balance/vestibular impairments.  OBJECTIVE IMPAIRMENTS: Abnormal gait, decreased balance, decreased mobility, difficulty walking, dizziness, and impaired vision/preception.   ACTIVITY LIMITATIONS: bending, sitting, standing, stairs, and transfers  PARTICIPATION LIMITATIONS: interpersonal relationship, driving, and community activity  PERSONAL FACTORS: Age, Transportation, and 3+ comorbidities: see above for details including PD, sclerosis, and  are also affecting patient's functional outcome.   REHAB POTENTIAL: Good  CLINICAL DECISION MAKING: Evolving/moderate complexity  EVALUATION COMPLEXITY: Moderate   PLAN:  PT FREQUENCY: 2x/week  PT DURATION: 6 weeks  PLANNED INTERVENTIONS: Therapeutic exercises, Therapeutic activity, Neuromuscular re-education, Balance training, Gait training, Patient/Family education, Self Care, Vestibular training, and Canalith repositioning  PLAN FOR NEXT SESSION: NA - patient discharge  PHYSICAL THERAPY DISCHARGE SUMMARY  Visits from Start of Care: 11  Current functional level related to goals / functional outcomes: Achieved 6/6 goals; appropriated use of DME observed during session with caregiver assist as needed   Remaining deficits: Ongoing deficits related to Parkinson's; recommend  possible follow up in future to address with PWR once spinal precautions are lifted   Education / Equipment: Recommend rollator and provided education on safety techniques/use of lock breaks, final updated HEP   Patient agrees to discharge. Patient goals were met. Patient is being discharged due to maximized rehab potential.    Malachi Carl, PT, DPT 02/25/22 4:03 PM

## 2022-02-25 NOTE — Telephone Encounter (Signed)
ATC patient to discuss MTX plan. Unable to reach. Left VM requesting return call  Knox Saliva, PharmD, MPH, BCPS, CPP Clinical Pharmacist (Rheumatology and Pulmonology)

## 2022-02-28 DIAGNOSIS — R41841 Cognitive communication deficit: Secondary | ICD-10-CM | POA: Diagnosis not present

## 2022-02-28 DIAGNOSIS — G20A1 Parkinson's disease without dyskinesia, without mention of fluctuations: Secondary | ICD-10-CM | POA: Diagnosis not present

## 2022-02-28 DIAGNOSIS — D869 Sarcoidosis, unspecified: Secondary | ICD-10-CM | POA: Diagnosis not present

## 2022-03-01 ENCOUNTER — Ambulatory Visit: Payer: Medicare Other

## 2022-03-04 ENCOUNTER — Ambulatory Visit: Payer: Medicare Other | Admitting: Physical Therapy

## 2022-03-04 DIAGNOSIS — D869 Sarcoidosis, unspecified: Secondary | ICD-10-CM | POA: Diagnosis not present

## 2022-03-04 DIAGNOSIS — R2689 Other abnormalities of gait and mobility: Secondary | ICD-10-CM | POA: Diagnosis not present

## 2022-03-04 DIAGNOSIS — M5451 Vertebrogenic low back pain: Secondary | ICD-10-CM | POA: Diagnosis not present

## 2022-03-05 ENCOUNTER — Other Ambulatory Visit: Payer: Self-pay | Admitting: Pharmacist

## 2022-03-05 DIAGNOSIS — D869 Sarcoidosis, unspecified: Secondary | ICD-10-CM

## 2022-03-05 DIAGNOSIS — H3090 Unspecified chorioretinal inflammation, unspecified eye: Secondary | ICD-10-CM

## 2022-03-05 MED ORDER — METHOTREXATE SODIUM 2.5 MG PO TABS: ORAL_TABLET | ORAL | 0 refills | Status: DC

## 2022-03-05 MED ORDER — HUMIRA (2 PEN) 40 MG/0.4ML ~~LOC~~ AJKT: 40.0000 mg | AUTO-INJECTOR | SUBCUTANEOUS | 0 refills | Status: DC

## 2022-03-05 NOTE — Telephone Encounter (Signed)
ATC patient regarding MTX plan. Phone went straight to VM. Left VM requesting return call.  MyChart message sent to patient.  Knox Saliva, PharmD, MPH, BCPS, CPP Clinical Pharmacist (Rheumatology and Pulmonology)

## 2022-03-05 NOTE — Telephone Encounter (Signed)
Refill sent for HUMIRA to [facility] for Humira/Rinvoq/Skyrizi: [phone]  Dose: 40 mg subcut every 2 weeks  Last OV: 02/13/2022 Provider: Dr. Loanne Drilling  Next OV: 04/15/2022  Labs 02/04/2022 stble  Knox Saliva, PharmD, MPH, BCPS Clinical Pharmacist (Rheumatology and Pulmonology)

## 2022-03-05 NOTE — Telephone Encounter (Signed)
Spoke with patient regarding MTX plan. She states that took 3 tabs of MTX on 02/19/2022 then 3 tabs on 02/26/2022. This morning she took MTX 2 tabs. Will plan to take 2 tabs next Tuesday, 03/12/2022. She will take MTX 1 tab on 03/19/2022 and 03/26/2022. Advised her to continue folic acid daily through at least 7 days after last MTX dose.   She confirms that she has enough MTX tablets on hand to complete out taper plan.  She verbalized understanding to all of hte above.  Knox Saliva, PharmD, MPH, BCPS, CPP Clinical Pharmacist (Rheumatology and Pulmonology)

## 2022-03-05 NOTE — Addendum Note (Signed)
Addended by: Cassandria Anger on: 03/05/2022 04:23 PM   Modules accepted: Orders

## 2022-03-08 DIAGNOSIS — R41841 Cognitive communication deficit: Secondary | ICD-10-CM | POA: Diagnosis not present

## 2022-03-08 DIAGNOSIS — M5451 Vertebrogenic low back pain: Secondary | ICD-10-CM | POA: Diagnosis not present

## 2022-03-08 DIAGNOSIS — G20A1 Parkinson's disease without dyskinesia, without mention of fluctuations: Secondary | ICD-10-CM | POA: Diagnosis not present

## 2022-03-08 DIAGNOSIS — D869 Sarcoidosis, unspecified: Secondary | ICD-10-CM | POA: Diagnosis not present

## 2022-03-08 DIAGNOSIS — R2689 Other abnormalities of gait and mobility: Secondary | ICD-10-CM | POA: Diagnosis not present

## 2022-03-14 DIAGNOSIS — G20A1 Parkinson's disease without dyskinesia, without mention of fluctuations: Secondary | ICD-10-CM | POA: Diagnosis not present

## 2022-03-14 DIAGNOSIS — D869 Sarcoidosis, unspecified: Secondary | ICD-10-CM | POA: Diagnosis not present

## 2022-03-14 DIAGNOSIS — R41841 Cognitive communication deficit: Secondary | ICD-10-CM | POA: Diagnosis not present

## 2022-03-15 DIAGNOSIS — R2689 Other abnormalities of gait and mobility: Secondary | ICD-10-CM | POA: Diagnosis not present

## 2022-03-15 DIAGNOSIS — M5451 Vertebrogenic low back pain: Secondary | ICD-10-CM | POA: Diagnosis not present

## 2022-03-15 DIAGNOSIS — D869 Sarcoidosis, unspecified: Secondary | ICD-10-CM | POA: Diagnosis not present

## 2022-03-17 NOTE — Progress Notes (Addendum)
Cardiology Office Note   Date:  03/19/2022   ID:  Eliot, Destiny Roth 30-Apr-1950, MRN [id number]  PCP:  Lorenda Ishihara, MD  Cardiologist:   Dietrich Pates, MD    Pt presents for follow up of CAD, HTN, SOB   History of Present Illness: Destiny Roth is a 72 y.o. female with a history of Parkinsons, HTN, HL, sarcoid and restrictive lung dz and Parkinsons Followed in Pulmonary clinic ([facility])  and neurology     In 2015 pt had a myoview that was normal an echo that showed normal LV function   She continued to have dyspnea   Went on to have a R and L heart cath  Continued to have symptoms  Had a R and  L heart cath on 11/09/12.  R heart ath showed upper normal R heart pressures with  PA pressure  39/18  PCWP 14   L heart cath with LAD  30 ot 40%; LCx normal  RCA 10%   Vigorous LVEF    Stress echo at [facility] in 2019 was normal  In 2021 the pt had a PET scan at [facility] that was negative for cardiac involvement     The pt also says she is dizzy a lot  Denies syncope      Pt had a stress echo at [facility] in 2019   Normal   Stress echo here in 2022   Walked 6 min to 92% max HR   Echo normal     She ws seen by Leda Gauze in 2023        The pt says her BP has been low     90s-110s/80s   Doesn't feel dizzy but feels that room "moves"   Feels this often     Has problems with balance    Notes SOB with activity that has been relatively stable      Current Meds  Medication Sig   acetaminophen (TYLENOL) 500 MG tablet Take 500 mg by mouth every 6 (six) hours as needed for mild pain.   Adalimumab (HUMIRA, 2 PEN,) 40 MG/0.4ML PNKT Inject 40 mg into the skin every 14 (fourteen) days. 1 kit - 2 pens   Amantadine HCl 100 MG tablet Take 100 mg by mouth in the morning, at noon, and at bedtime.   aspirin EC 81 MG tablet Take 81 mg by mouth daily.   Biotin 1 MG CAPS Take 1 mg by mouth daily at 2 PM.   carbidopa-levodopa (SINEMET IR) 25-100 MG tablet Take 0.5-1 tablets by mouth See admin instructions. Take 1  tablet by mouth in the morning and then take half tablet by mouth at noon, and 4 pm and at bedtime per patient   Carbidopa-Levodopa ER (SINEMET CR) 25-100 MG tablet controlled release Take 1 tablet by mouth 2 (two) times daily.   folic acid (FOLVITE) 1 MG tablet TAKE 1 TABLET BY MOUTH EVERY DAY (Patient taking differently: Take 1 mg by mouth daily. Patient will stop taken Folvite in 2 weeks.)   gabapentin (NEURONTIN) 100 MG capsule Take 200 mg by mouth 3 (three) times daily.   memantine (NAMENDA) 5 MG tablet Take 5 mg by mouth. Patient has not started it yet as she wants to know more about it   methotrexate (RHEUMATREX) 2.5 MG tablet Take 2 tablets (5 mg total) by mouth once a week for 14 days, THEN 1 tablet (2.5 mg total) once a week for 14 days. Then discontinue..   ondansetron (  ZOFRAN) 4 MG tablet Take 1 tablet (4 mg total) by mouth every 8 (eight) hours as needed for nausea or vomiting.   rOPINIRole (REQUIP) 1 MG tablet Take 1 tablet (1 mg total) by mouth 3 (three) times daily.   rosuvastatin (CRESTOR) 10 MG tablet TAKE 1 TABLET BY MOUTH EVERY DAY   senna (SENOKOT) 8.6 MG TABS tablet Take 1 tablet (8.6 mg total) by mouth 2 (two) times daily.   sertraline (ZOLOFT) 100 MG tablet Take 1 tablet by mouth daily.     Allergies:   Myrbetriq [mirabegron], Atorvastatin, and Zocor [simvastatin]   Past Medical History:  Diagnosis Date   Abdominal pain, epigastric 09/18/2009   Anosmia 11/06/2010   ANXIETY 09/23/2006   BUNION, RIGHT FOOT 09/18/2009   CARPAL TUNNEL SYNDROME, BILATERAL 09/23/2006   DEPRESSION 09/23/2006   DIVERTICULOSIS, COLON 09/23/2006   GERD 05/24/2008   GLUCOSE INTOLERANCE 05/24/2008   Hemorrhoids    Hypercalcemia    HYPERLIPIDEMIA 09/23/2006   HYPERTENSION 09/23/2006   IBS 09/23/2006   Impaired glucose tolerance 11/04/2010   OSTEOPOROSIS 05/24/2008   06/01/19-pt states osteopenia   Parkinson's disease    Restrictive lung disease 12/20/2013   Sarcoidosis 02/26/2019    Past Surgical  History:  Procedure Laterality Date   ABDOMINAL EXPOSURE N/A 10/29/2021   Procedure: LATERAL EXPOSURE FOR OBLIQUE SPINE SURGERY;  Surgeon: Cephus Shelling, MD;  Location: [facility] OR;  Service: Vascular;  Laterality: N/A;   CARDIAC CATHETERIZATION     CATARACT EXTRACTION     COLONOSCOPY  2008   COLONOSCOPY W/ BIOPSIES  2008   Dr. Loreta Ave -negative random bxs   ESOPHAGOGASTRODUODENOSCOPY  2008   Dr. Jeral Fruit duodenal ulcer   FOOT SURGERY Bilateral 2015   LEFT AND RIGHT HEART CATHETERIZATION WITH CORONARY ANGIOGRAM N/A 11/09/2013   Procedure: LEFT AND RIGHT HEART CATHETERIZATION WITH CORONARY ANGIOGRAM;  Surgeon: Peter M Swaziland, MD;  Location: [facility] CATH LAB;  Service: Cardiovascular;  Laterality: N/A;   nasal septum repair     OBLIQUE LUMBAR INTERBODY FUSION 1 LEVEL WITH PERCUTANEOUS SCREWS N/A 10/29/2021   Procedure: OBLIQUE LUMBAR INTERBODY FUSION WITH SUPPLEMENTAL PEDICLE SCREW FIXATION LUMBAR FOUR TO FIVE;  Surgeon: Venita Lick, MD;  Location: [facility] OR;  Service: Orthopedics;  Laterality: N/A;   s/p bilat CTS     s/p fibroid ablation     s/p ganglion cyst right wrist     s/p right thumb trigger finger surgury     SHOULDER SURGERY Right 2015   TUBAL LIGATION     UPPER GASTROINTESTINAL ENDOSCOPY       Social History:  The patient  reports that she has never smoked. She has been exposed to tobacco smoke. She has never used smokeless tobacco. She reports that she does not currently use alcohol. She reports that she does not use drugs.   Family History:  The patient's family history includes Heart attack in her brother and mother; Heart disease (age of onset: 23) in her father; Hypertension in her brother, brother, brother, and father; Peripheral vascular disease in her mother.    ROS:  Please see the history of present illness. All other systems are reviewed and  Negative to the above problem except as noted.    PHYSICAL EXAM: VS:  BP 130/86   Pulse 85   Ht 4\' 10"  (1.473 m)   Wt 130 lb  3.2 oz (59.1 kg)   SpO2 98%   BMI 27.21 kg/m    Orthostatics: Layin   170/92  P 78  Sitting  164/88  P 79   Standing   150/90  P 89   Standing 4 min  140/82  P 89     GEN: Well nourished, well developed, in no acute distress  HEENT: normal  Neck: no JVD, carotid bruit Cardiac: RRR; no murmurs, No LE edema  Respiratory:  clear to auscultation bilaterally,Moving air OK GI: soft, nontender, nondistended, + BS  No hepatomegaly  MS: no deformity Moving all extremities   Skin: warm and dry, no rash Neuro:  Strength and sensation are intact Psych: euthymic mood, full affect   EKG:  EKG is not ordered today.  Stress echo 09/2020   1. Stress echocardiogram with no chest pain, normal BP response, no ST  changes and no stress-induced wall motion abnormalities; normal study.   2. This is a negative stress echocardiogram for ischemia.   3. This is a low risk study.    PET scan 12/2019    FINAL COMMENTS   1- Cardiac PET/CT Metabolic Study:   No Evidence of Increased F-18 FDG in The Myocardium To Suggest Inflamation. No Evidence of Active Inflamatory Process   Suuc As Active Sarcoid or Myocarditis.    2- Cardiac PET/CT Myocardial Perfusion Imaging Study Demonstrate No Evidence of Perfusion Abnormalities.    3- Gated Cardiac PET/CT Functional Study Demonstrate Normal Left Ventricular Ejection Fraction at Rest as Well as Normal Left   Ventricular Volumes.    3- Gated Cardiac PET/CT Functional Study Demonstrate Normal Left Ventricular Ejection Fraction at Rest as Well as Normal Left   Ventricular Volumes.    4- Gated Cardiac PET/CT Functional Study Demonstrate Normal Left Ventricular Function and Wall Thickening.    5- No Evidence of Increased Tracer Uptake in The Right Ventricular Free Wall.   Echo  07/16/17   RESTING ECHOCARDIOGRAPHIC MEASUREMENTS----------------------------------------    2D DIMENSIONS   AORTA          Values  Normal Range      Aorta Sin   2.7 cm  [2.7 - 3.3]    LEFT VENTRICLE          LVIDd   3.7 cm  [3.9 - 5.3]          LVIDs   3.1 cm             FS  16%      [> 25%]            SWT   1.2 cm  [0.6 - 1.0]            PWT   1.1 cm  [0.6 - 1.0]   LEFT ATRIUM        LA Diam   2.8 cm  [2.7 - 3.8]   RESTING ECHOCARDIOGRAPHIC DESCRIPTIONS ---------------------------------------    LEFT VENTRICLE           Size: Normal    Contraction: Normal                     LV Masses: No Masses            LVH: MILD LVH CONCENTRIC     Dias.Class: Normal    RIGHT VENTRICLE           Size: Normal                     Free Wall: Normal    Contraction: Normal  RV Masses: No Masses    PERICARDIUM          Fluid: No effusion   RESTING DOPPLER ECHO and OTHER SPECIAL PROCEDURES ----------------------------      Aortic: No AR                  No AS      Mitral: TRIVIAL MR             No MS   Tricuspid: TRIVIAL TR             No TS   Pulmonary: TRIVIAL PR             No PS      Other:             DEFINITY CONTRAST SHOWS ENHANCED LV BORDERS   STRESS ECHOCARDIOGRAPHY ------------------------------------------------------            Protocol: Treadmill (Bruce)              Drugs: None  Target Heart Rate: 129  Maximum Predicted Heart Rate: 152        Resting ECG: Normal; Sinus rhythm   TYPE     STAGE    TIME    HR    BP    RPE  SPO2 COMMENTS  -------- ----- --------- --- ------- ----- ---- ------------------------------  Baseline                 101 132/ 98  BaseLine   1             107 132/ 98            Definity 2ml given IV push  BaseLine   2             104 124/ 66  Stress     1    180 sec. 130 154/ 76  7/20  Stress     2    180 sec. 144 175/ 75 12/20  Stress     3     20 sec. 144    /               Definity 2ml given IV push  Stress     4     10 sec. 144    /  Recovery   1      1 min. 136 193/ 72  Recovery   2      2 min. 122    /  Recovery   3      4 min. 110 162/ 74  Recovery   4      6 min. 107 162/ 74  Recovery   5      8  min. 106 152/ 75  Recovery   6     10 min. 105 136/ 79     Stress Duration: 6.50 minutes.  Max Stress H.R: 144   Target Heart Rate (129) Achieved: Yes    Max. workload of  8.90  METs was achieved during exercise.        HR Response: Appropriate        BP Response: Normal resting BP - appropriate response   WALL SEGMENT FINDINGS --------------------------------------------------------                          Rest            Stress                         --------------- ---------------  Anterior Septum Basal: Normal          Hyperkinetic                    Mid: Normal          Hyperkinetic          Apical Septum: Normal          Hyperkinetic     Anterior Wall Basal: Normal          Hyperkinetic                    Mid: Normal          Hyperkinetic                 Apical: Normal          Hyperkinetic      Lateral Wall Basal: Normal          Hyperkinetic                    Mid: Normal          Hyperkinetic                 Apical: Normal          Hyperkinetic    Posterior Wall Basal: Normal          Hyperkinetic                    Mid: Normal          Hyperkinetic     Inferior Wall Basal: Normal          Hyperkinetic                    Mid: Normal          Hyperkinetic                 Apical: Normal          Hyperkinetic   Inferior Septum Basal: Normal          Hyperkinetic                    Mid: Normal          Hyperkinetic                      EF: >55%            >55%    ADDITIONAL FINDINGS ----------------------------------------------------------     Chest Discomfort: None          Arrhythmia: None  Termination Reason: Fatigue       Complications: None   STRESS ECG RESULTS -----------------------------------------------------------    ECG Results: Normal. .   INTERPRETATION ---------------------------------------------------------------    NORMAL STRESS TEST. NORMAL RESTING STUDY WITH NO WALL MOTION ABNORMALITIES   AT REST AND PEAK STRESS.   NORMAL LA PRESSURES  WITH NORMAL DIASTOLIC FUNCTION   VALVULAR REGURGITATION: TRIVIAL MR, TRIVIAL PR, TRIVIAL TR   NO VALVULAR STENOSIS   Maximum workload of  8.90 METs was achieved during exercise.   NORMAL RESTING BP - APPROPRIATE RESPONSE   Lipid Panel    Component Value Date/Time   CHOL 106 02/06/2019 0920   TRIG 240 (H) 02/06/2019 0920   HDL <10 (L) 02/06/2019 0920   CHOLHDL NOT CALCULATED 02/06/2019 0920   VLDL 48 (H) 02/06/2019 0920   LDLCALC NOT CALCULATED 02/06/2019 0920  LDLDIRECT 104.0 07/07/2014 1225      Wt Readings from Last 3 Encounters:  03/19/22 130 lb 3.2 oz (59.1 kg)  02/13/22 126 lb 9.6 oz (57.4 kg)  12/14/21 123 lb (55.8 kg)      ASSESSMENT AND PLAN:  1 Blood pressure    PT reports BP at home is much lower   Off of amlodipoine     Here her BP is high laying and drops signifcantly with standing (170 to 140) I have asked the pt to keep a log over the next 1-2 wks of BP laying , sitting, standing    She has appt at [facility] (neuro) next week   Take cuff and readings to appt.   ? If drop related to Parkinsons      Will follow along with assessment   2  Hx SOB/Chst tightness   Relatively stable  Felt due to sarcoid  Follow   3  CAD   MIld at cath in 2015  Neg stress echo in 2022   Follow   4 Sarcoid   Follows at [facility]   PET negative for cardiac involvement in 2021   5  HL  Continue statin  LDL 54  HDL 67     6  Parkinsons  Follows with neuro [facility]  Appt soon   Current medicines are reviewed at length with the patient today.  The patient does not have concerns regarding medicines.  Signed, Dietrich Pates, MD  03/19/2022 4:03 PM    [facility] [address] [place], [place], [place]  [address] Phone: [phone]; Fax: [phone]

## 2022-03-18 DIAGNOSIS — M5451 Vertebrogenic low back pain: Secondary | ICD-10-CM | POA: Diagnosis not present

## 2022-03-18 DIAGNOSIS — D869 Sarcoidosis, unspecified: Secondary | ICD-10-CM | POA: Diagnosis not present

## 2022-03-18 DIAGNOSIS — R2689 Other abnormalities of gait and mobility: Secondary | ICD-10-CM | POA: Diagnosis not present

## 2022-03-19 ENCOUNTER — Ambulatory Visit: Payer: Medicare Other | Attending: Internal Medicine | Admitting: Internal Medicine

## 2022-03-19 ENCOUNTER — Encounter: Payer: Self-pay | Admitting: Internal Medicine

## 2022-03-19 VITALS — BP 130/86 | HR 85 | Ht <= 58 in | Wt 130.2 lb

## 2022-03-19 DIAGNOSIS — R0602 Shortness of breath: Secondary | ICD-10-CM | POA: Diagnosis not present

## 2022-03-19 NOTE — Patient Instructions (Signed)
Medication Instructions:   *If you need a refill on your cardiac medications before your next appointment, please call your pharmacy*   Lab Work:  If you have labs (blood work) drawn today and your tests are completely normal, you will receive your results only by: [place] (if you have MyChart) OR A paper copy in the mail If you have any lab test that is abnormal or we need to change your treatment, we will call you to review the results.   Testing/Procedures:    Follow-Up: At [facility], you and your health needs are our priority.  As part of our continuing mission to provide you with exceptional heart care, we have created designated Provider Care Teams.  These Care Teams include your primary Cardiologist (physician) and Advanced Practice Providers (APPs -  Physician Assistants and Nurse Practitioners) who all work together to provide you with the care you need, when you need it.  We recommend signing up for the patient portal called "MyChart".  Sign up information is provided on this After Visit Summary.  MyChart is used to connect with patients for Virtual Visits (Telemedicine).  Patients are able to view lab/test results, encounter notes, upcoming appointments, etc.  Non-urgent messages can be sent to your provider as well.   To learn more about what you can do with MyChart, go to [email or web address].

## 2022-03-22 DIAGNOSIS — M5451 Vertebrogenic low back pain: Secondary | ICD-10-CM | POA: Diagnosis not present

## 2022-03-22 DIAGNOSIS — D869 Sarcoidosis, unspecified: Secondary | ICD-10-CM | POA: Diagnosis not present

## 2022-03-22 DIAGNOSIS — R2689 Other abnormalities of gait and mobility: Secondary | ICD-10-CM | POA: Diagnosis not present

## 2022-03-25 DIAGNOSIS — M5451 Vertebrogenic low back pain: Secondary | ICD-10-CM | POA: Diagnosis not present

## 2022-03-27 DIAGNOSIS — D869 Sarcoidosis, unspecified: Secondary | ICD-10-CM | POA: Diagnosis not present

## 2022-03-27 DIAGNOSIS — M5451 Vertebrogenic low back pain: Secondary | ICD-10-CM | POA: Diagnosis not present

## 2022-03-27 DIAGNOSIS — R2689 Other abnormalities of gait and mobility: Secondary | ICD-10-CM | POA: Diagnosis not present

## 2022-03-28 ENCOUNTER — Encounter: Payer: Self-pay | Admitting: Internal Medicine

## 2022-03-28 DIAGNOSIS — M792 Neuralgia and neuritis, unspecified: Secondary | ICD-10-CM | POA: Diagnosis not present

## 2022-03-28 DIAGNOSIS — G629 Polyneuropathy, unspecified: Secondary | ICD-10-CM | POA: Diagnosis not present

## 2022-03-28 DIAGNOSIS — D869 Sarcoidosis, unspecified: Secondary | ICD-10-CM | POA: Diagnosis not present

## 2022-03-30 ENCOUNTER — Other Ambulatory Visit: Payer: Self-pay | Admitting: Internal Medicine

## 2022-04-15 ENCOUNTER — Ambulatory Visit (HOSPITAL_BASED_OUTPATIENT_CLINIC_OR_DEPARTMENT_OTHER): Payer: Medicare Other | Admitting: Pulmonary Disease

## 2022-04-16 ENCOUNTER — Other Ambulatory Visit: Payer: Self-pay

## 2022-04-16 ENCOUNTER — Other Ambulatory Visit (INDEPENDENT_AMBULATORY_CARE_PROVIDER_SITE_OTHER): Payer: Medicare Other

## 2022-04-16 DIAGNOSIS — Z5181 Encounter for therapeutic drug level monitoring: Secondary | ICD-10-CM

## 2022-04-16 DIAGNOSIS — Z4889 Encounter for other specified surgical aftercare: Secondary | ICD-10-CM | POA: Diagnosis not present

## 2022-04-17 LAB — COMPREHENSIVE METABOLIC PANEL
ALT: 10 U/L (ref 0–35)
AST: 23 U/L (ref 0–37)
Albumin: 4.1 g/dL (ref 3.5–5.2)
Alkaline Phosphatase: 86 U/L (ref 39–117)
BUN: 34 mg/dL — ABNORMAL HIGH (ref 6–23)
CO2: 26 mEq/L (ref 19–32)
Calcium: 10 mg/dL (ref 8.4–10.5)
Chloride: 104 mEq/L (ref 96–112)
Creatinine, Ser: 1.54 mg/dL — ABNORMAL HIGH (ref 0.40–1.20)
GFR: 33.28 mL/min — ABNORMAL LOW (ref >60.00)
Glucose, Bld: 114 mg/dL — ABNORMAL HIGH (ref 70–99)
Potassium: 4 mEq/L (ref 3.5–5.1)
Sodium: 139 mEq/L (ref 135–145)
Total Bilirubin: 0.3 mg/dL (ref 0.2–1.2)
Total Protein: 7.6 g/dL (ref 6.0–8.3)

## 2022-04-17 LAB — CBC WITH DIFFERENTIAL/PLATELET
Basophils Absolute: 0 10*3/uL (ref 0.0–0.1)
Basophils Relative: 0.8 % (ref 0.0–3.0)
Eosinophils Absolute: 0.1 10*3/uL (ref 0.0–0.7)
Eosinophils Relative: 1.8 % (ref 0.0–5.0)
HCT: 42.3 % (ref 36.0–46.0)
Hemoglobin: 14.1 g/dL (ref 12.0–15.0)
Lymphocytes Relative: 18.8 % (ref 12.0–46.0)
Lymphs Abs: 1.1 10*3/uL (ref 0.7–4.0)
MCHC: 33.3 g/dL (ref 30.0–36.0)
MCV: 94 fl (ref 78.0–100.0)
Monocytes Absolute: 0.5 10*3/uL (ref 0.1–1.0)
Monocytes Relative: 8.4 % (ref 3.0–12.0)
Neutro Abs: 4.1 10*3/uL (ref 1.4–7.7)
Neutrophils Relative %: 70.2 % (ref 43.0–77.0)
Platelets: 307 10*3/uL (ref 150.0–400.0)
RBC: 4.5 Mil/uL (ref 3.87–5.11)
RDW: 13.9 % (ref 11.5–15.5)
WBC: 5.9 10*3/uL (ref 4.0–10.5)

## 2022-04-24 ENCOUNTER — Ambulatory Visit (INDEPENDENT_AMBULATORY_CARE_PROVIDER_SITE_OTHER): Payer: Medicare Other | Admitting: Pulmonary Disease

## 2022-04-24 ENCOUNTER — Encounter (HOSPITAL_BASED_OUTPATIENT_CLINIC_OR_DEPARTMENT_OTHER): Payer: Self-pay | Admitting: Pulmonary Disease

## 2022-04-24 VITALS — BP 140/78 | HR 92 | Temp 98.1°F | Ht <= 58 in | Wt 132.6 lb

## 2022-04-24 DIAGNOSIS — Z79899 Other long term (current) drug therapy: Secondary | ICD-10-CM | POA: Diagnosis not present

## 2022-04-24 DIAGNOSIS — D869 Sarcoidosis, unspecified: Secondary | ICD-10-CM | POA: Diagnosis not present

## 2022-04-24 DIAGNOSIS — H3090 Unspecified chorioretinal inflammation, unspecified eye: Secondary | ICD-10-CM

## 2022-04-24 LAB — PULMONARY FUNCTION TEST
DL/VA % pred: 92 %
DL/VA: 4.05 ml/min/mmHg/L
DLCO cor % pred: 88 %
DLCO cor: 13.16 ml/min/mmHg
DLCO unc % pred: 90 %
DLCO unc: 13.43 ml/min/mmHg
FEF 25-75 Pre: 2.02 L/sec
FEF2575-%Pred-Pre: 148 %
FEV1-%Pred-Pre: 116 %
FEV1-Pre: 1.77 L
FEV1FVC-%Pred-Pre: 114 %
FEV6-%Pred-Pre: 106 %
FEV6-Pre: 2.06 L
FEV6FVC-%Pred-Pre: 105 %
FVC-%Pred-Pre: 100 %
FVC-Pre: 2.06 L
Pre FEV1/FVC ratio: 86 %
Pre FEV6/FVC Ratio: 100 %
RV % pred: 88 %
RV: 1.67 L
TLC % pred: 99 %
TLC: 3.96 L

## 2022-04-24 NOTE — Patient Instructions (Signed)
Spirometry, DLCO, & Lung Volumes Performed Today.

## 2022-04-24 NOTE — Progress Notes (Unsigned)
Spirometry, DLCO, & Lung Volumes Performed Today.

## 2022-04-24 NOTE — Telephone Encounter (Signed)
Blood pressures overall are very good     I do not think she needs appt on 4/2   Can make for later this summer (Aug/September) Keep periodic follow up of BP

## 2022-04-24 NOTE — Progress Notes (Signed)
Subjective:   PATIENT ID: Destiny Roth: female DOB: 04/23/1950, MRN: [id number]   HPI  Chief Complaint  Patient presents with   Follow-up    Follow up.     Reason for Visit: Follow-up  Destiny Roth is a 72 year old with Parkinson's disease, HTN, HLD and restrictive lung disease who presents for follow-up for sarcoid management.  Synopsis:  Initially seen as an inpatient Pulmonary consult by me on 02/23/19 for hypercalcemia. Bone biopsy was performed and demonstrated granulomas disease without necrosis. She was started on steroids and discharged with Endocrine and Pulmonary follow-up. Of note, she was previously seen in our Pulmonary clinic in 2018 for dyspnea and mild sleep apnea. Her prior PFTs demonstrate mild restrictive defect with normal DLCO which patient stated was attributed to her Parkinson's disease.  2021 - She is currently on tacrolimus 1 mg BID, methotrexate and prednisone. She continues to have fatigue and visual blurriness. Her dyspnea also remains unchanged. Worsens with exertion. Tacrolimus was discontinued by ophthalmology due tremors. Started on CellCept in November 2021.  2022 - Weaned off methotrexate. She still continues to have issues with fatigue, tremors and word finding after switching to Cellcept. When seen by her neurologist, she reports that her Parkinson's is stable. She was referred to Dr. Harriette Ohara to evaluate for neuro involvement of her sarcoid. Ophthalmology appt 03/14/20 ocular exam shows stable inflammation. Her ophthalmology note from 05/16/20 she has requested stopping taking immunosuppressants to see if these are the cause of her symptoms of fatigue, memory and tremor issues. Her fatigue and tremors have improved off the cellcept however it remains persistent. Unclear if her memory is better off cellcept. Started on Humira on 07/11/20. Started on Amantadine for progressive dyskinesia related to her Parkinsons. She is unsure if the  inhaler helps but she is compliant with Incruse daily. She was seen by Neurology and gabapentin increased for newly diagnosed small fiber neuropathy. She reports that her vision is still distorted and occurs in the day but worsens at night.  2023 - Ophthalmology noted by Dr. Manuella Ghazi on 01/16/21 reviewed with no signs of active inflammation on eye exam. She reports that she is struggling with her lower extremity neuropathy which worsens her balance. She has been compliant with methotrexate and Humira. She is potentially planning for spine surgery (C and L). She is having some hair loss that started with methotrexate use. She reports her vision is unchanged.  12/14/21 She is having balance issues and currently in physical therapy. Presents with her husband. She reports her breathing is still labored. No longer feels tight. Notices more mouth breathing. Stable productive cough daily in the morning and evening. After her surgery in October 2nd, she resumed her humira and methotrexate  02/13/22 Since our last visit her Parkinson's has progressed and needing more support with dizziness. Husband provides support. She reports shortness of breath that is unchanged and labored with exertion. Unchanged chronic productive cough. Interested in coming off methotrexate if this is an option. Discontinued Incruse due to cankar sores  04/24/22 Since our last visit she has been weaned off methotrexate and has not taken any 2-3 weeks. She reports no difference with her breathing with unchanged shortness of breath associated with pain. Occasional productive cough that is unchanged.   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[facility] [employer] [facility] [employer]   Past Medical History:  Diagnosis Date   Abdominal pain, epigastric 09/18/2009   Anosmia 11/06/2010   ANXIETY 09/23/2006   BUNION,  RIGHT FOOT 09/18/2009   CARPAL TUNNEL SYNDROME, BILATERAL 09/23/2006   DEPRESSION 09/23/2006   DIVERTICULOSIS, COLON 09/23/2006   GERD  05/24/2008   GLUCOSE INTOLERANCE 05/24/2008   Hemorrhoids    Hypercalcemia    HYPERLIPIDEMIA 09/23/2006   HYPERTENSION 09/23/2006   IBS 09/23/2006   Impaired glucose tolerance 11/04/2010   OSTEOPOROSIS 05/24/2008   06/01/19-pt states osteopenia   Parkinson's disease    Restrictive lung disease 12/20/2013   Sarcoidosis 02/26/2019      Outpatient Medications Prior to Visit  Medication Sig Dispense Refill   acetaminophen (TYLENOL) 500 MG tablet Take 500 mg by mouth every 6 (six) hours as needed for mild pain.     Adalimumab (HUMIRA, 2 PEN,) 40 MG/0.4ML PNKT Inject 40 mg into the skin every 14 (fourteen) days. 1 kit - 2 pens 6 each 0   Amantadine HCl 100 MG tablet Take 100 mg by mouth in the morning, at noon, and at bedtime.     aspirin EC 81 MG tablet Take 81 mg by mouth daily.     Biotin 1 MG CAPS Take 1 mg by mouth daily at 2 PM. 30 capsule 5   carbidopa-levodopa (SINEMET IR) 25-100 MG tablet Take 0.5-1 tablets by mouth See admin instructions. Take 1 tablet by mouth in the morning and then take half tablet by mouth at noon, and 4 pm and at bedtime per patient     Carbidopa-Levodopa ER (SINEMET CR) 25-100 MG tablet controlled release Take 1 tablet by mouth 2 (two) times daily.     gabapentin (NEURONTIN) 100 MG capsule Take 200 mg by mouth 3 (three) times daily.     memantine (NAMENDA) 5 MG tablet Take 10 mg by mouth. Patient has not started it yet as she wants to know more about it     ondansetron (ZOFRAN) 4 MG tablet Take 1 tablet (4 mg total) by mouth every 8 (eight) hours as needed for nausea or vomiting. 20 tablet 0   rOPINIRole (REQUIP) 1 MG tablet Take 1 tablet (1 mg total) by mouth 3 (three) times daily. 90 tablet 0   rosuvastatin (CRESTOR) 10 MG tablet TAKE 1 TABLET BY MOUTH EVERY DAY 90 tablet 3   senna (SENOKOT) 8.6 MG TABS tablet Take 1 tablet (8.6 mg total) by mouth 2 (two) times daily. 30 tablet 0   sertraline (ZOLOFT) 100 MG tablet Take 1 tablet by mouth daily.     folic acid  (FOLVITE) 1 MG tablet TAKE 1 TABLET BY MOUTH EVERY DAY (Patient not taking: Reported on 04/24/2022) 90 tablet 2   methotrexate (RHEUMATREX) 2.5 MG tablet Take 2 tablets (5 mg total) by mouth once a week for 14 days, THEN 1 tablet (2.5 mg total) once a week for 14 days. Then discontinue.Marland Kitchen 6 tablet 0   No facility-administered medications prior to visit.    Review of Systems  Constitutional:  Positive for malaise/fatigue. Negative for chills, diaphoresis, fever and weight loss.  HENT:  Negative for congestion.   Respiratory:  Positive for cough and shortness of breath. Negative for hemoptysis, sputum production and wheezing.   Cardiovascular:  Negative for chest pain, palpitations and leg swelling.     Objective:   Vitals:   04/24/22 1408  BP: (!) 140/78  Pulse: 92  Temp: 98.1 F (36.7 C)  TempSrc: Oral  SpO2: 99%  Weight: 132 lb 9.6 oz (60.1 kg)  Height: 4' 9.5" (1.461 m)   SpO2: 99 % O2 Device: None (Room air)  Physical Exam:  General: Well-appearing, no acute distress HENT: [place], AT Eyes: EOMI, no scleral icterus Respiratory: Clear to auscultation bilaterally.  No crackles, wheezing or rales Cardiovascular: RRR, -M/R/G, no JVD Extremities:-Edema,-tenderness Neuro: AAO x4, CNII-XII grossly intact Psych: Normal mood, normal affect  Data Reviewed:  Imaging: CXR 02/23/19 - interval resolutions of bilateral pleural effusions CT Chest 02/09/19 - Bilateral pleural effusions. Mild ground glass opacifications bilaterally. No enlarged mediastinal or hilar adenopathy. PET Myocardial 01/07/20 - No evidence of increased uptake PET/CT 01/07/20 1. Bilateral ground-glass pulmonary opacities demonstrate mild-moderate FDG avidity. These findings are nonspecific and may be infectious or inflammatory.2. Mild FDG activity of non-enlarged bilateral hilar lymph nodes is also nonspecific.  This could be reactive in the setting of the pulmonary findings, but activity related to sarcoid cannot be  excluded.  CXR 03/30/20 - No infiltrate, effusion or edema.  PET Myocardial 03/09/21 - Persistent FDG avid bilateral hilar lymphadenopathy. New FDG avid prevascular lymph node. Increased bilateral FDG avid groundglass pulmonary opacities. These remain nonspecific and are again favored to be infectious/ inflammatory etiology. No evidence of active cardiac sarcoidosis.   PFT: 12/17/13 FVC 2.26 (86%) FEV1 1.88 (93%) Ratio 78  TLC 70% DLCO 75% Interpretation: Mild restrictive defect with mild reduction in gas exchange (uncorrected DLCO)  06/22/19 FVC 2.07 (95%) FEV1 1.75 (107%) Ratio 80  TLC 91% DLCO 63% Interpretation: No obstructive or restrictive defect. Mild reduction in gas exchange   06/10/20 FVC 2.11 (99%) FEV1 1.8 (113%) Ratio 82  TLC 132% RV 162% RV/TLC 130% DLCO 94% Interpretation: Normal spirometry and gas exchange. Compared to prior PFTs, patient has had increased lung volumes suggestive of air trapping. No significant bronchodilator response however does not preclude benefit. Clinically correlate  09/12/20 FVC 2.17 (102%) FEV1 1.87 (117%) Ratio 86  TLC 105% DLCO 88% Interpretation: Normal PFTs. Air trapping has resolved.  04/21/22 FVC 2.06 (100%) FEV1 1.77 (116%) Ratio 86  TLC 99% DLCO 90% Interpretation: Normal PFTs   Echocardiogram: 04/22/19 - EF 60-65%. Grade I DD. No WMA or valvular abnormalities.  CBC    Latest Ref Rng & Units 04/16/2022    4:52 PM 02/04/2022    1:38 PM 10/31/2021    9:18 PM  CBC  WBC 4.0 - 10.5 K/uL 5.9  7.0  7.8   Hemoglobin 12.0 - 15.0 g/dL 14.1  13.3  12.0   Hematocrit 36.0 - 46.0 % 42.3  41.1  35.2   Platelets 150.0 - 400.0 K/uL 307.0  340.0  221   Normal blood counts     Latest Ref Rng & Units 04/16/2022    4:52 PM 02/04/2022    1:38 PM 10/31/2021    9:18 PM  BMP  Glucose 70 - 99 mg/dL 114  108  129   BUN 6 - 23 mg/dL 34  27  12   Creatinine 0.40 - 1.20 mg/dL 1.54  1.26  1.01   Sodium 135 - 145 mEq/L 139  139  136   Potassium 3.5 - 5.1 mEq/L  4.0  4.0  3.7   Chloride 96 - 112 mEq/L 104  102  102   CO2 19 - 32 mEq/L 26  27  23    Calcium 8.4 - 10.5 mg/dL 10.0  10.0  9.1   Creatinine slightly increased     Latest Ref Rng & Units 04/16/2022    4:52 PM 02/04/2022    1:38 PM 10/31/2021    9:18 PM  Hepatic Function  Total Protein 6.0 - 8.3 g/dL 7.6  8.0  6.7   Albumin 3.5 - 5.2 g/dL 4.1  4.1  3.3   AST 0 - 37 U/L 23  19  62   ALT 0 - 35 U/L 10  7  11    Alk Phosphatase 39 - 117 U/L 86  76  54   Total Bilirubin 0.2 - 1.2 mg/dL 0.3  0.3  0.8   Normal LFTs     Latest Ref Rng & Units 06/30/2020    4:14 PM  Quantiferon TB Gold  Quantiferon TB Gold Plus NEGATIVE NEGATIVE     Assessment & Plan:   Discussion: 72 year old female with sarcoid, Parkinson's disease who presents for follow-up involving eyes and lungs. Weaned off methotrexate with unchanged respiratory symptoms. Normal and stable PFTs. Counseled extensively regarding low threshold to restart immunosuppression if her ocular exam or pulmonary symptoms worsen in the future. Discussed infliximab as an alternative. She is hesitant and right now prefers monotherapy with Humira which she has tolerated well.  Sarcoid with pulm, ocular, renal, bone and small fiber neuropathy involvement --Dx 02/26/19 vis bone biopsy at [facility] --Stable PFTs. Last completed 04/24/22 with normal PFTs --Echo reviewed from 03/2019 with grade I DD. EKG 03/2020 with unchanged prolonged PR interval   --Sarcoid PET 12/2019 - negative for cardiac involvement. Suggests pulmonary involvement --Sarcoid PET 03/09/21 - active pulmonary involvement. Neg cardiac involvement.   High risk medication management --Completed prednisone taper 03/2020 --Started methotrexate 04/2020 --Started cellcept 07/2020. 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 Johnney Ou. Will CC notes --Will need to resume monthly labs if restarted on Cellcept  History of hypercalcemia secondary to sarcoid - no recurrence --Serial monitoring --Management as above --Patient on vitamin D supplementation  Small fiber neuropathy secondary to sarcoid Parkinson's disease  --MRI Brain 03/2019 neg for neurosarcoid --Spine MRI 02/2020 neg for spinal sarcoid  --Followed by [facility]rology: Dr. Lezlie Octave for Parkinson's  --Followed [facility] [name] [facility]. On gabapentin --Continue Namenda  Hair loss/alopecia --CONTINUE biotin 1 mg daily  Health Maintenance Immunization History  Administered Date(s) Administered   Influenza Split 12/06/2019   Influenza, High Dose Seasonal PF 11/30/2015, 11/16/2016, 10/29/2017, 10/30/2018, 12/09/2018, 10/31/2020   Influenza, Seasonal, Injecte, Preservative Fre 10/20/2012   Influenza,inj,Quad PF,6+ Mos 11/18/2013   Influenza-Unspecified 11/18/2013, 11/30/2014, 10/29/2017, 10/29/2018   Moderna Sars-Covid-2 Vaccination 03/12/2019, 04/09/2019, 12/06/2019, 05/18/2020   Pfizer Covid-19 Vaccine Bivalent Booster 71yrs & up 06/05/2021   Pneumococcal Conjugate-13 01/01/2013, 01/05/2014   Pneumococcal Polysaccharide-23  01/01/2013, 01/05/2014, 02/25/2020   Tdap 11/06/2010, 05/01/2021   Zoster Recombinat (Shingrix) 01/10/2018, 03/31/2018   Zoster, Live 11/06/2010, 01/15/2018, 03/20/2018   CT Lung Screen - not qualified  Orders Placed This Encounter  Procedures   Comp Met (CMET)    Standing Status:   Future    Standing Expiration Date:  04/24/2023   QuantiFERON-TB Gold Plus    Standing Status:   Future    Standing Expiration Date:   04/24/2023   Pulmonary Function Test    Spiro/DLCO    Standing Status:   Future    Number of Occurrences:   1    Standing Expiration Date:   04/24/2023    Order Specific Question:   Where should this test be performed?    Answer:   Other   No orders of the defined types were placed in this encounter.  Return in about 3 months (around 07/25/2022) for routine follow-up, for 30 min slot, with Dr. Loanne Drilling.   I have spent a total time of 35-minutes on the day of the appointment including chart review, data review, collecting history, coordinating care and discussing medical diagnosis and plan with the patient/family. Past medical history, allergies, medications were reviewed. Pertinent imaging, labs and tests included in this note have been reviewed and interpreted independently by me.  [place], MD [facility] 04/24/2022 5:00 PM  Office Number [phone]

## 2022-04-24 NOTE — Patient Instructions (Addendum)
Sarcoid with pulm, ocular, renal, bone and small fiber neuropathy involvement --Plan for repeat Sarcoid PET in September. Will order at next visit --Off methotrexate --Continue Humira --ORDER labs: CMET, annual quantiferon. Plan for labs at the end of April

## 2022-04-30 ENCOUNTER — Ambulatory Visit: Payer: Medicare Other | Admitting: Internal Medicine

## 2022-04-30 DIAGNOSIS — H4710 Unspecified papilledema: Secondary | ICD-10-CM | POA: Diagnosis not present

## 2022-04-30 DIAGNOSIS — Z79899 Other long term (current) drug therapy: Secondary | ICD-10-CM | POA: Diagnosis not present

## 2022-04-30 DIAGNOSIS — H3581 Retinal edema: Secondary | ICD-10-CM | POA: Diagnosis not present

## 2022-04-30 DIAGNOSIS — H30033 Focal chorioretinal inflammation, peripheral, bilateral: Secondary | ICD-10-CM | POA: Diagnosis not present

## 2022-04-30 DIAGNOSIS — D869 Sarcoidosis, unspecified: Secondary | ICD-10-CM | POA: Diagnosis not present

## 2022-04-30 DIAGNOSIS — H44113 Panuveitis, bilateral: Secondary | ICD-10-CM | POA: Diagnosis not present

## 2022-05-07 DIAGNOSIS — D869 Sarcoidosis, unspecified: Secondary | ICD-10-CM | POA: Diagnosis not present

## 2022-05-07 DIAGNOSIS — I10 Essential (primary) hypertension: Secondary | ICD-10-CM | POA: Diagnosis not present

## 2022-05-07 DIAGNOSIS — Z0000 Encounter for general adult medical examination without abnormal findings: Secondary | ICD-10-CM | POA: Diagnosis not present

## 2022-05-07 DIAGNOSIS — R2681 Unsteadiness on feet: Secondary | ICD-10-CM | POA: Diagnosis not present

## 2022-05-07 DIAGNOSIS — H9319 Tinnitus, unspecified ear: Secondary | ICD-10-CM | POA: Diagnosis not present

## 2022-05-07 DIAGNOSIS — N1832 Chronic kidney disease, stage 3b: Secondary | ICD-10-CM | POA: Diagnosis not present

## 2022-05-07 DIAGNOSIS — F3341 Major depressive disorder, recurrent, in partial remission: Secondary | ICD-10-CM | POA: Diagnosis not present

## 2022-05-07 DIAGNOSIS — Z1331 Encounter for screening for depression: Secondary | ICD-10-CM | POA: Diagnosis not present

## 2022-05-07 DIAGNOSIS — R42 Dizziness and giddiness: Secondary | ICD-10-CM | POA: Diagnosis not present

## 2022-06-05 DIAGNOSIS — F411 Generalized anxiety disorder: Secondary | ICD-10-CM | POA: Diagnosis not present

## 2022-06-05 DIAGNOSIS — F3341 Major depressive disorder, recurrent, in partial remission: Secondary | ICD-10-CM | POA: Diagnosis not present

## 2022-06-05 DIAGNOSIS — G20A1 Parkinson's disease without dyskinesia, without mention of fluctuations: Secondary | ICD-10-CM | POA: Diagnosis not present

## 2022-06-05 DIAGNOSIS — R2681 Unsteadiness on feet: Secondary | ICD-10-CM | POA: Diagnosis not present

## 2022-06-05 DIAGNOSIS — H539 Unspecified visual disturbance: Secondary | ICD-10-CM | POA: Diagnosis not present

## 2022-06-10 DIAGNOSIS — D869 Sarcoidosis, unspecified: Secondary | ICD-10-CM | POA: Diagnosis not present

## 2022-06-10 DIAGNOSIS — G20B1 Parkinson's disease with dyskinesia, without mention of fluctuations: Secondary | ICD-10-CM | POA: Diagnosis not present

## 2022-06-10 DIAGNOSIS — G629 Polyneuropathy, unspecified: Secondary | ICD-10-CM | POA: Diagnosis not present

## 2022-06-13 DIAGNOSIS — H6122 Impacted cerumen, left ear: Secondary | ICD-10-CM | POA: Diagnosis not present

## 2022-06-13 DIAGNOSIS — Z23 Encounter for immunization: Secondary | ICD-10-CM | POA: Diagnosis not present

## 2022-06-13 DIAGNOSIS — Z1231 Encounter for screening mammogram for malignant neoplasm of breast: Secondary | ICD-10-CM | POA: Diagnosis not present

## 2022-06-14 MED ORDER — HUMIRA (2 PEN) 40 MG/0.4ML ~~LOC~~ AJKT: 40.0000 mg | AUTO-INJECTOR | SUBCUTANEOUS | 3 refills | Status: DC

## 2022-06-14 NOTE — Addendum Note (Signed)
Addended by: Luciano Cutter on: 06/14/2022 01:05 PM   Modules accepted: Orders

## 2022-06-25 DIAGNOSIS — H903 Sensorineural hearing loss, bilateral: Secondary | ICD-10-CM | POA: Diagnosis not present

## 2022-06-27 ENCOUNTER — Other Ambulatory Visit (HOSPITAL_COMMUNITY): Payer: Self-pay | Admitting: Internal Medicine

## 2022-06-27 DIAGNOSIS — E785 Hyperlipidemia, unspecified: Secondary | ICD-10-CM

## 2022-07-03 DIAGNOSIS — D869 Sarcoidosis, unspecified: Secondary | ICD-10-CM | POA: Diagnosis not present

## 2022-07-03 DIAGNOSIS — N1831 Chronic kidney disease, stage 3a: Secondary | ICD-10-CM | POA: Diagnosis not present

## 2022-07-03 DIAGNOSIS — I129 Hypertensive chronic kidney disease with stage 1 through stage 4 chronic kidney disease, or unspecified chronic kidney disease: Secondary | ICD-10-CM | POA: Diagnosis not present

## 2022-07-08 DIAGNOSIS — Z961 Presence of intraocular lens: Secondary | ICD-10-CM | POA: Diagnosis not present

## 2022-07-08 DIAGNOSIS — H04123 Dry eye syndrome of bilateral lacrimal glands: Secondary | ICD-10-CM | POA: Diagnosis not present

## 2022-07-17 ENCOUNTER — Other Ambulatory Visit: Payer: Medicare Other

## 2022-07-17 ENCOUNTER — Other Ambulatory Visit: Payer: Self-pay | Admitting: Pulmonary Disease

## 2022-07-17 DIAGNOSIS — Z79899 Other long term (current) drug therapy: Secondary | ICD-10-CM | POA: Diagnosis not present

## 2022-07-17 DIAGNOSIS — D869 Sarcoidosis, unspecified: Secondary | ICD-10-CM

## 2022-07-18 LAB — COMPREHENSIVE METABOLIC PANEL
AG Ratio: 1.5 (calc) (ref 1.0–2.5)
AST: 24 U/L (ref 10–35)
Albumin: 4.6 g/dL (ref 3.6–5.1)
CO2: 28 mmol/L (ref 20–32)
Globulin: 3 g/dL (calc) (ref 1.9–3.7)
Sodium: 139 mmol/L (ref 135–146)
Total Bilirubin: 0.5 mg/dL (ref 0.2–1.2)
Total Protein: 7.6 g/dL (ref 6.1–8.1)

## 2022-07-19 LAB — COMPREHENSIVE METABOLIC PANEL
ALT: 16 U/L (ref 6–29)
Alkaline phosphatase (APISO): 88 U/L (ref 37–153)
BUN/Creatinine Ratio: 24 (calc) — ABNORMAL HIGH (ref 6–22)
BUN: 31 mg/dL — ABNORMAL HIGH (ref 7–25)
Calcium: 10.7 mg/dL — ABNORMAL HIGH (ref 8.6–10.4)
Chloride: 101 mmol/L (ref 98–110)
Creat: 1.28 mg/dL — ABNORMAL HIGH (ref 0.60–1.00)
Glucose, Bld: 90 mg/dL (ref 65–99)
Potassium: 4.5 mmol/L (ref 3.5–5.3)

## 2022-07-19 LAB — QUANTIFERON-TB GOLD PLUS
Mitogen-NIL: 7.49 IU/mL
NIL: 0.03 IU/mL
QuantiFERON-TB Gold Plus: NEGATIVE
TB1-NIL: 0 IU/mL
TB2-NIL: 0 IU/mL

## 2022-07-24 ENCOUNTER — Ambulatory Visit (INDEPENDENT_AMBULATORY_CARE_PROVIDER_SITE_OTHER): Payer: Medicare Other | Admitting: Pulmonary Disease

## 2022-07-24 ENCOUNTER — Encounter (HOSPITAL_BASED_OUTPATIENT_CLINIC_OR_DEPARTMENT_OTHER): Payer: Self-pay | Admitting: Pulmonary Disease

## 2022-07-24 ENCOUNTER — Ambulatory Visit (HOSPITAL_BASED_OUTPATIENT_CLINIC_OR_DEPARTMENT_OTHER)
Admission: RE | Admit: 2022-07-24 | Discharge: 2022-07-24 | Disposition: A | Discharge status: Home / Self Care | Payer: Medicare Other | Source: Ambulatory Visit | Attending: Internal Medicine | Admitting: Internal Medicine

## 2022-07-24 VITALS — BP 124/80 | HR 92 | Ht <= 58 in | Wt 127.0 lb

## 2022-07-24 DIAGNOSIS — D869 Sarcoidosis, unspecified: Secondary | ICD-10-CM | POA: Diagnosis not present

## 2022-07-24 DIAGNOSIS — E785 Hyperlipidemia, unspecified: Secondary | ICD-10-CM | POA: Insufficient documentation

## 2022-07-24 NOTE — Progress Notes (Signed)
Subjective:   PATIENT ID: Destiny Roth: female DOB: Feb 12, 1950, MRN: [id number]   HPI  Chief Complaint  Patient presents with   Follow-up    3 mo f/u. States she has been stable since last visit.     Reason for Visit: Follow-up  Mrs. Gaylia Bringman is a 72 year old with Parkinson's disease, HTN, HLD and restrictive lung disease who presents for follow-up for sarcoid management.  Synopsis:  Initially seen as an inpatient Pulmonary consult by me on 02/23/19 for hypercalcemia. Bone biopsy was performed and demonstrated granulomas disease without necrosis. She was started on steroids and discharged with Endocrine and Pulmonary follow-up. Of note, she was previously seen in our Pulmonary clinic in 2018 for dyspnea and mild sleep apnea. Her prior PFTs demonstrate mild restrictive defect with normal DLCO which patient stated was attributed to her Parkinson's disease.  2021 - She is currently on tacrolimus 1 mg BID, methotrexate and prednisone. She continues to have fatigue and visual blurriness. Her dyspnea also remains unchanged. Worsens with exertion. Tacrolimus was discontinued by ophthalmology due tremors. Started on CellCept in November 2021.  2022 - Weaned off methotrexate. She still continues to have issues with fatigue, tremors and word finding after switching to Cellcept. When seen by her neurologist, she reports that her Parkinson's is stable. She was referred to Dr. Yevonne Pax to evaluate for neuro involvement of her sarcoid. Ophthalmology appt 03/14/20 ocular exam shows stable inflammation. Her ophthalmology note from 05/16/20 she has requested stopping taking immunosuppressants to see if these are the cause of her symptoms of fatigue, memory and tremor issues. Her fatigue and tremors have improved off the cellcept however it remains persistent. Unclear if her memory is better off cellcept. Started on Humira on 07/11/20. Started on Amantadine for progressive dyskinesia  related to her Parkinsons. She is unsure if the inhaler helps but she is compliant with Incruse daily. She was seen by Neurology and gabapentin increased for newly diagnosed small fiber neuropathy. She reports that her vision is still distorted and occurs in the day but worsens at night.  2023 - Ophthalmology noted by Dr. Sherryll Burger on 01/16/21 reviewed with no signs of active inflammation on eye exam. She reports that she is struggling with her lower extremity neuropathy which worsens her balance. She has been compliant with methotrexate and Humira. She underwent spine surgery (C and L) in October and held her immunosuppression temporarily. She is having some hair loss that started with methotrexate use. She reports her vision is unchanged. Having balance issues but thought to be due to Parkinsons. 2024 - Parkinson's has progressed and needing more support with dizziness. Discontinued Incruse due to cankar sores. Weaned off methotrexate in March due to concern for side effects, however unable to differentiate between chronic med issues vs side effects of med.  07/24/22 Compliant with Humira. Reports no shortness of breath but does have minor productive cough that improves with expelling sputum once a day. Husband reports she is fatigue. She is not active at baseline. Feels dizzy with standing, not sure if related. Was previously in physical therapy but not currently. Has been trying to cut back on sedating meds including gabapentin and requip as tolerated.   Social History: Never smoker Previously Merchandiser, retail for admission at [facility]   Past Medical History:  Diagnosis Date   Abdominal pain, epigastric 09/18/2009   Anosmia 11/06/2010   ANXIETY 09/23/2006   BUNION, RIGHT FOOT 09/18/2009   CARPAL TUNNEL SYNDROME, BILATERAL 09/23/2006   DEPRESSION  09/23/2006   DIVERTICULOSIS, COLON 09/23/2006   GERD 05/24/2008   GLUCOSE INTOLERANCE 05/24/2008   Hemorrhoids    Hypercalcemia    HYPERLIPIDEMIA 09/23/2006    HYPERTENSION 09/23/2006   IBS 09/23/2006   Impaired glucose tolerance 11/04/2010   OSTEOPOROSIS 05/24/2008   06/01/19-pt states osteopenia   Parkinson's disease    Restrictive lung disease 12/20/2013   Sarcoidosis 02/26/2019      Outpatient Medications Prior to Visit  Medication Sig Dispense Refill   acetaminophen (TYLENOL) 500 MG tablet Take 500 mg by mouth every 6 (six) hours as needed for mild pain.     Adalimumab (HUMIRA, 2 PEN,) 40 MG/0.4ML PNKT Inject 40 mg into the skin every 14 (fourteen) days. 1 kit - 2 pens 6 each 3   Amantadine HCl 100 MG tablet Take 100 mg by mouth in the morning, at noon, and at bedtime.     aspirin EC 81 MG tablet Take 81 mg by mouth daily.     Biotin 1 MG CAPS Take 1 mg by mouth daily at 2 PM. 30 capsule 5   carbidopa-levodopa (SINEMET IR) 25-100 MG tablet Take 0.5-1 tablets by mouth See admin instructions. Take 1 tablet by mouth in the morning and then take half tablet by mouth at noon, and 4 pm and at bedtime per patient     Carbidopa-Levodopa ER (SINEMET CR) 25-100 MG tablet controlled release Take 1 tablet by mouth 2 (two) times daily.     gabapentin (NEURONTIN) 100 MG capsule Take 200 mg by mouth 3 (three) times daily.     memantine (NAMENDA) 5 MG tablet Take 10 mg by mouth. Patient has not started it yet as she wants to know more about it     rOPINIRole (REQUIP) 1 MG tablet Take 1 tablet (1 mg total) by mouth 3 (three) times daily. 90 tablet 0   rosuvastatin (CRESTOR) 10 MG tablet TAKE 1 TABLET BY MOUTH EVERY DAY 90 tablet 3   senna (SENOKOT) 8.6 MG TABS tablet Take 1 tablet (8.6 mg total) by mouth 2 (two) times daily. 30 tablet 0   sertraline (ZOLOFT) 100 MG tablet Take 1 tablet by mouth daily.     folic acid (FOLVITE) 1 MG tablet TAKE 1 TABLET BY MOUTH EVERY DAY (Patient not taking: Reported on 04/24/2022) 90 tablet 2   methotrexate (RHEUMATREX) 2.5 MG tablet Take 2 tablets (5 mg total) by mouth once a week for 14 days, THEN 1 tablet (2.5 mg total) once a  week for 14 days. Then discontinue.Marland Kitchen 6 tablet 0   ondansetron (ZOFRAN) 4 MG tablet Take 1 tablet (4 mg total) by mouth every 8 (eight) hours as needed for nausea or vomiting. 20 tablet 0   No facility-administered medications prior to visit.    Review of Systems  Constitutional:  Positive for malaise/fatigue. Negative for chills, diaphoresis, fever and weight loss.  HENT:  Negative for congestion.   Respiratory:  Positive for cough. Negative for hemoptysis, sputum production, shortness of breath and wheezing.   Cardiovascular:  Negative for chest pain, palpitations and leg swelling.  Neurological:  Positive for dizziness.   Objective:   Vitals:   07/24/22 1521  BP: 124/80  Pulse: 92  SpO2: 96%  Weight: 127 lb (57.6 kg)  Height: 4' 9.5" (1.461 m)   SpO2: 96 % (on RA)  Physical Exam: General: Well-appearing, no acute distress HENT: [place], AT Eyes: EOMI, no scleral icterus Respiratory: Clear to auscultation bilaterally.  No crackles, wheezing or rales Cardiovascular:  RRR, -M/R/G, no JVD Extremities:-Edema,-tenderness Neuro: AAO x4, CNII-XII grossly intact Psych: Normal mood, normal affect  Data Reviewed:  Imaging: CXR 02/23/19 - interval resolutions of bilateral pleural effusions CT Chest 02/09/19 - Bilateral pleural effusions. Mild ground glass opacifications bilaterally. No enlarged mediastinal or hilar adenopathy. PET Myocardial 01/07/20 - No evidence of increased uptake PET/CT 01/07/20 1. Bilateral ground-glass pulmonary opacities demonstrate mild-moderate FDG avidity. These findings are nonspecific and may be infectious or inflammatory.2. Mild FDG activity of non-enlarged bilateral hilar lymph nodes is also nonspecific.  This could be reactive in the setting of the pulmonary findings, but activity related to sarcoid cannot be excluded.  CXR 03/30/20 - No infiltrate, effusion or edema.  PET Myocardial 03/09/21 - Persistent FDG avid bilateral hilar lymphadenopathy. New FDG avid  prevascular lymph node. Increased bilateral FDG avid groundglass pulmonary opacities. These remain nonspecific and are again favored to be infectious/ inflammatory etiology. No evidence of active cardiac sarcoidosis.   PFT: 12/17/13 FVC 2.26 (86%) FEV1 1.88 (93%) Ratio 78  TLC 70% DLCO 75% Interpretation: Mild restrictive defect with mild reduction in gas exchange (uncorrected DLCO)  06/22/19 FVC 2.07 (95%) FEV1 1.75 (107%) Ratio 80  TLC 91% DLCO 63% Interpretation: No obstructive or restrictive defect. Mild reduction in gas exchange   06/10/20 FVC 2.11 (99%) FEV1 1.8 (113%) Ratio 82  TLC 132% RV 162% RV/TLC 130% DLCO 94% Interpretation: Normal spirometry and gas exchange. Compared to prior PFTs, patient has had increased lung volumes suggestive of air trapping. No significant bronchodilator response however does not preclude benefit. Clinically correlate  09/12/20 FVC 2.17 (102%) FEV1 1.87 (117%) Ratio 86  TLC 105% DLCO 88% Interpretation: Normal PFTs. Air trapping has resolved.  04/24/22 FVC 2.06 (100%) FEV1 1.77 (116%) Ratio 86  TLC 99% DLCO 90% Interpretation: Normal PFTs   Echocardiogram: 04/22/19 - EF 60-65%. Grade I DD. No WMA or valvular abnormalities.  CBC    Latest Ref Rng & Units 04/16/2022    4:52 PM 02/04/2022    1:38 PM 10/31/2021    9:18 PM  CBC  WBC 4.0 - 10.5 K/uL 5.9  7.0  7.8   Hemoglobin 12.0 - 15.0 g/dL 16.1  09.6  04.5   Hematocrit 36.0 - 46.0 % 42.3  41.1  35.2   Platelets 150.0 - 400.0 K/uL 307.0  340.0  221   No new CBC     Latest Ref Rng & Units 07/17/2022    2:31 PM 04/16/2022    4:52 PM 02/04/2022    1:38 PM  BMP  Glucose 65 - 99 mg/dL 90  409  811   BUN 7 - 25 mg/dL 31  34  27   Creatinine 0.60 - 1.00 mg/dL 9.14  7.82  9.56   BUN/Creat Ratio 6 - 22 (calc) 24     Sodium 135 - 146 mmol/L 139  139  139   Potassium 3.5 - 5.3 mmol/L 4.5  4.0  4.0   Chloride 98 - 110 mmol/L 101  104  102   CO2 20 - 32 mmol/L 28  26  27    Calcium 8.6 - 10.4 mg/dL 21.3   08.6  57.8   Cr improved/stable     Latest Ref Rng & Units 07/17/2022    2:31 PM 04/16/2022    4:52 PM 02/04/2022    1:38 PM  Hepatic Function  Total Protein 6.1 - 8.1 g/dL 7.6  7.6  8.0   Albumin 3.5 - 5.2 g/dL  4.1  4.1  AST 10 - 35 U/L 24  23  19    ALT 6 - 29 U/L 16  10  7    Alk Phosphatase 39 - 117 U/L  86  76   Total Bilirubin 0.2 - 1.2 mg/dL 0.5  0.3  0.3   Normal LFTs     Latest Ref Rng & Units 07/17/2022    2:31 PM  Quantiferon TB Gold  Quantiferon TB Gold Plus NEGATIVE NEGATIVE     Assessment & Plan:   Discussion: 72 year old female with sarcoid, Parkinson's disease who presents for sarcoid flare follow-up. Weaned off methotrexate in March 2023. Remains on Humira. Ocular exam with no active inflammation however calcium mildly elevated. Discussed low threshold to restart immunosuppression if systemic symptoms recur/worsen. She prefers to remain on monotherapy if able due to intolerance of medications in the past. Will consider restarting methotrexate vs adding acthar if needed  Sarcoid with pulm, ocular, renal, bone and small fiber neuropathy involvement --Dx 02/26/19 vis bone biopsy at [facility] --Stable PFTs. Last completed 04/24/22 with normal PFTs --Echo reviewed from 03/2019 with grade I DD. EKG 11/01/21 overall normal. No prolonged PR interval --Sarcoid PET 12/2019 - negative for cardiac involvement. Suggests pulmonary involvement --Sarcoid PET 03/09/21 - active pulmonary involvement. Neg cardiac involvement.   High risk medication management --Transitioned from Tacrolimus to Cellcept 11/2019 --Completed prednisone taper 03/2020 --Started methotrexate 04/2020 --Started cellcept 07/2020. Methotrexate concurrently weaned d/t intolerance --Prednisone taper started with cellcept discontinuation d/t intolerance 04/2020 --Immunosuppressant holiday  --Started Humira on 07/11/20 for persistent ocular symptoms --S/p prednisone taper from Dec 2022 to Jan 2023 --Readded  methotrexate 02/2021-03/2022 --Off methotrexate --Continue Humira. Eye exam with no inflammation on 04/30/22. Previously not quiescent on 11/2021 visit. 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 Glenna Fellows. Will CC notes --Will need to resume monthly labs if restarted on Cellcept  Hypercalcemia secondary to sarcoid - Mildly elevated on labs --Serial monitoring --Management as above --Follow-up on next labs. 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 Raquel Sarna for Parkinson's  --Followed [facility] Dr. Champ Mungo at [facility]. On gabapentin --Continue Namenda  Hair loss/alopecia --CONTINUE biotin 1 mg daily  Health Maintenance Immunization History  Administered Date(s) Administered   Influenza Split 12/06/2019   Influenza, High Dose Seasonal PF 11/30/2015, 11/16/2016, 10/29/2017, 10/30/2018, 12/09/2018, 10/31/2020   Influenza, Seasonal, Injecte, Preservative Fre 10/20/2012   Influenza,inj,Quad PF,6+ Mos 11/18/2013   Influenza-Unspecified 11/18/2013, 11/30/2014, 10/29/2017, 10/29/2018   Moderna Sars-Covid-2 Vaccination 03/12/2019, 04/09/2019, 12/06/2019, 05/18/2020   Pfizer Covid-19 Vaccine Bivalent Booster 80yrs & up 06/05/2021    Pneumococcal Conjugate-13 01/01/2013, 01/05/2014   Pneumococcal Polysaccharide-23 01/01/2013, 01/05/2014, 02/25/2020   Tdap 11/06/2010, 05/01/2021   Zoster Recombinat (Shingrix) 01/10/2018, 03/31/2018   Zoster, Live 11/06/2010, 01/15/2018, 03/20/2018   CT Lung Screen - not qualified  Orders Placed This Encounter  Procedures  CBC With Differential    Standing Status:   Future    Standing Expiration Date:   07/27/2023   Comp Met (CMET)    Standing Status:   Future    Standing Expiration Date:   07/27/2023   No orders of the defined types were placed in this encounter.  Return in about 3 months (around 10/24/2022).   I have spent a total time of 45-minutes on the day of the appointment including chart review, data review, collecting history, coordinating care and discussing medical diagnosis and plan with the patient/family. Past medical history, allergies, medications were reviewed. Pertinent imaging, labs and tests included in this note have been reviewed and interpreted independently by me.  Pantera Winterrowd Mechele Collin, MD [facility] 07/24/2022 4:02 PM  Office Number [phone]

## 2022-07-24 NOTE — Patient Instructions (Addendum)
Sarcoid --Continue Humira. Eye exam with no inflammation on 04/30/22. Previously not quiescent on 11/2021 visit. If stable symptoms for one year, consider discontinuing by April 2025 but will need close monitoring  Hypercalcemia secondary to sarcoid - Mildly elevated on labs --Serial monitoring --Management as above --Follow-up on next labs. May be due to discontinuing methotrexate --If >11 or 12 in the future, may need to restart low dose methotrexate or infliximab

## 2022-07-25 ENCOUNTER — Ambulatory Visit (HOSPITAL_BASED_OUTPATIENT_CLINIC_OR_DEPARTMENT_OTHER): Payer: Medicare Other

## 2022-07-30 DIAGNOSIS — M549 Dorsalgia, unspecified: Secondary | ICD-10-CM | POA: Diagnosis not present

## 2022-07-30 DIAGNOSIS — G20B1 Parkinson's disease with dyskinesia, without mention of fluctuations: Secondary | ICD-10-CM | POA: Diagnosis not present

## 2022-09-02 ENCOUNTER — Ambulatory Visit: Payer: Medicare Other | Attending: Neurology | Admitting: Physical Therapy

## 2022-09-02 DIAGNOSIS — M6281 Muscle weakness (generalized): Secondary | ICD-10-CM | POA: Diagnosis not present

## 2022-09-02 DIAGNOSIS — R2681 Unsteadiness on feet: Secondary | ICD-10-CM | POA: Insufficient documentation

## 2022-09-02 DIAGNOSIS — R269 Unspecified abnormalities of gait and mobility: Secondary | ICD-10-CM | POA: Insufficient documentation

## 2022-09-02 DIAGNOSIS — R42 Dizziness and giddiness: Secondary | ICD-10-CM | POA: Diagnosis not present

## 2022-09-02 DIAGNOSIS — R293 Abnormal posture: Secondary | ICD-10-CM | POA: Insufficient documentation

## 2022-09-02 DIAGNOSIS — M5459 Other low back pain: Secondary | ICD-10-CM | POA: Diagnosis not present

## 2022-09-02 NOTE — Therapy (Signed)
OUTPATIENT PHYSICAL THERAPY NEURO EVALUATION   Patient Name: Destiny Roth MRN: [id number] DOB:09-21-1950, 72 y.o., female Today's Date: 09/02/2022   PCP: Lorenda Ishihara, MD REFERRING PROVIDER: Brion Aliment, FNP  END OF SESSION:  PT End of Session - 09/02/22 1237     Visit Number 1    Number of Visits 5    Date for PT Re-Evaluation 10/14/22    Authorization Type Medicare    PT Start Time 1233    PT Stop Time 1318    PT Time Calculation (min) 45 min    Activity Tolerance Patient limited by pain    Behavior During Therapy Anxious             Past Medical History:  Diagnosis Date   Abdominal pain, epigastric 09/18/2009   Anosmia 11/06/2010   ANXIETY 09/23/2006   BUNION, RIGHT FOOT 09/18/2009   CARPAL TUNNEL SYNDROME, BILATERAL 09/23/2006   DEPRESSION 09/23/2006   DIVERTICULOSIS, COLON 09/23/2006   GERD 05/24/2008   GLUCOSE INTOLERANCE 05/24/2008   Hemorrhoids    Hypercalcemia    HYPERLIPIDEMIA 09/23/2006   HYPERTENSION 09/23/2006   IBS 09/23/2006   Impaired glucose tolerance 11/04/2010   OSTEOPOROSIS 05/24/2008   06/01/19-pt states osteopenia   Parkinson's disease    Restrictive lung disease 12/20/2013   Sarcoidosis 02/26/2019   Past Surgical History:  Procedure Laterality Date   ABDOMINAL EXPOSURE N/A 10/29/2021   Procedure: LATERAL EXPOSURE FOR OBLIQUE SPINE SURGERY;  Surgeon: Cephus Shelling, MD;  Location: [facility] OR;  Service: Vascular;  Laterality: N/A;   CARDIAC CATHETERIZATION     CATARACT EXTRACTION     COLONOSCOPY  2008   COLONOSCOPY W/ BIOPSIES  2008   Dr. Loreta Ave -negative random bxs   ESOPHAGOGASTRODUODENOSCOPY  2008   Dr. Jeral Fruit duodenal ulcer   FOOT SURGERY Bilateral 2015   LEFT AND RIGHT HEART CATHETERIZATION WITH CORONARY ANGIOGRAM N/A 11/09/2013   Procedure: LEFT AND RIGHT HEART CATHETERIZATION WITH CORONARY ANGIOGRAM;  Surgeon: Peter M Swaziland, MD;  Location: [facility] CATH LAB;  Service: Cardiovascular;  Laterality: N/A;   nasal septum  repair     OBLIQUE LUMBAR INTERBODY FUSION 1 LEVEL WITH PERCUTANEOUS SCREWS N/A 10/29/2021   Procedure: OBLIQUE LUMBAR INTERBODY FUSION WITH SUPPLEMENTAL PEDICLE SCREW FIXATION LUMBAR FOUR TO FIVE;  Surgeon: Venita Lick, MD;  Location: [facility] OR;  Service: Orthopedics;  Laterality: N/A;   s/p bilat CTS     s/p fibroid ablation     s/p ganglion cyst right wrist     s/p right thumb trigger finger surgury     SHOULDER SURGERY Right 2015   TUBAL LIGATION     UPPER GASTROINTESTINAL ENDOSCOPY     Patient Active Problem List   Diagnosis Date Noted   S/P lumbar fusion 10/29/2021   Small fiber neuropathy 11/20/2020   Lumbar radiculopathy 06/28/2020   Chronic neck pain 06/20/2020   Degenerative spondylolisthesis 06/20/2020   High risk medication use 04/27/2020   Panuveitis of both eyes 04/27/2020   Parkinson's disease 09/19/2019   Optic nerve edema 07/06/2019   Peripheral focal chorioretinal inflammation of both eyes 07/06/2019   Retinal edema 07/06/2019   Therapeutic drug monitoring 05/26/2019   Visual disturbance 04/27/2019   Sarcoidosis 02/26/2019   Hypercalcemia 02/04/2019   Generalized weakness 02/04/2019   Left-sided vestibular weakness 07/24/2017   Moderate episode of recurrent major depressive disorder (HCC) 05/28/2017   RBD (REM behavioral disorder) 10/16/2016   Chronic depression 10/16/2016   Fatigue 10/16/2016   Vertigo 03/12/2016   Pulmonary  air trapping 02/24/2015   DOE (dyspnea on exertion) 11/18/2013   Hot flashes 11/18/2013   Memory impairment 08/04/2013   Orthostasis 02/26/2013   Tachycardia 02/24/2013   Coronary artery calcification seen on CAT scan 01/01/2013   Tendonitis, calcific, shoulder 01/01/2013   OSA (obstructive sleep apnea) 01/01/2013   PD (Parkinson's disease) (HCC) 11/26/2011   Hematochezia 11/09/2011   Colon polyps 11/09/2011   IBS (irritable bowel syndrome) 11/09/2011   Anosmia 11/06/2010   Impaired glucose tolerance 11/04/2010   Hearing loss  05/07/2010   Preventative health care 05/07/2010   BUNION, RIGHT FOOT 09/18/2009   GERD 05/24/2008   OSTEOPOROSIS 05/24/2008   Hyperlipidemia 09/23/2006   Anxiety state 09/23/2006   Depression 09/23/2006   CARPAL TUNNEL SYNDROME, BILATERAL 09/23/2006   Essential hypertension 09/23/2006   DIVERTICULOSIS, COLON 09/23/2006   Carpal tunnel syndrome, bilateral 09/23/2006    ONSET DATE: 08/08/2022  REFERRING DIAG: G20.B1 (ICD-10-CM) - Parkinson's disease with dyskinesia, without mention of fluctuations  THERAPY DIAG:  Unsteadiness on feet  Abnormality of gait and mobility  Muscle weakness (generalized)  Abnormal posture  Dizziness and giddiness  Other low back pain  Rationale for Evaluation and Treatment: Rehabilitation  SUBJECTIVE:                                                                                                                                                                                             SUBJECTIVE STATEMENT: Pt presents with RW. States she has "been bad" and not done any of her exercises since DC from PT in January of this year. States she has "balance issues with my head and my eyes do not work quite right". Thinks her Parkinson's has progressed and her back pain has worsened since her back surgery in 10/2021. States her legs and her feet do not feel normal due to small fiber polyneuropathy. Has had about 6 falls this year, but denies injuries. Unable to get up on her own. A lot of her falls happen in front of her dresser when she is trying move from her bench. States she gets dizzy and she falls to her R side. Is currently dizzy, rating as an 8/10. States she is having pain in her feet but further states it is numbness, not pain. States she does not use the RW in the house, sits on the couch to work during the day but needs help to get up. States this is a problem with her knees, "they feel hollow".    Pt accompanied by: significant other  PERTINENT  HISTORY: Sarcoidosis, Parkinson's, Lumbar fusion in 10/2021    PAIN:  Are you having  pain? Yes: NPRS scale: 5/10 Pain location: Bilateral feet  Pain description: "a lot of it is not feeling" Aggravating factors: N/A Relieving factors: Gabapentin   PRECAUTIONS: Fall  RED FLAGS: None   WEIGHT BEARING RESTRICTIONS: No  FALLS: Has patient fallen in last 6 months? Yes. Number of falls ~6   LIVING ENVIRONMENT: Lives with: lives with their spouse Lives in: House/apartment Stairs: Yes: Internal: sunken living room steps; on right going up, on left going up, and can reach both and External: 3 steps; on right going up Has following equipment at home: Walker - 2 wheeled and shower chair  PLOF: Requires assistive device for independence, Needs assistance with homemaking, and Needs assistance with transfers  PATIENT GOALS: "To work on my back" "I want to be able to get more strength in my back and less pain"   OBJECTIVE:   DIAGNOSTIC FINDINGS: None relevant on file   COGNITION: Overall cognitive status: Impaired   SENSATION: Pt reports complete numbness in BLEs and distal BUEs    POSTURE: rounded shoulders and forward head   BED MOBILITY:  Pt reports husband built her a step with a platform to get into bed. Requires assistance when she is tired   TRANSFERS: Assistive device utilized:  RW w/casters placed on front legs and wheels placed on back legs    Sit to stand: CGA and Min A Stand to sit: CGA Pt demonstrates decreased anterior lean and pulling up on RW to stand, husband holds RW to stabilize   GAIT: Gait pattern: step through pattern, decreased stride length, decreased hip/knee flexion- Right, decreased hip/knee flexion- Left, decreased trunk rotation, trunk flexed, poor foot clearance- Right, and poor foot clearance- Left Distance walked: Various clinic distances  Assistive device utilized:  RW w/casters placed on front legs and wheels placed on back legs Level of  assistance: SBA Comments: Increased difficulty w/turns, heavy reliance on BUE support   FUNCTIONAL TESTS:   [facility] PT Assessment - 09/02/22 1309       Transfers   Five time sit to stand comments  25.03s   BUE support on RW, significant retropulsion, husband bracing RW            PATIENT SURVEYS:  Modified Oswestry To be assessed    TODAY'S TREATMENT:         Next Session                                                                                                                          PATIENT EDUCATION: Education details: POC, eval findings, lengthy discussion on importance of mobility for improved QOL, reduced pain levels and strength. Pt hyperverbal throughout eval and perseverating on her ailments, which frustrated husband who reports he has been encouraging pt to move at home but she will not. Informed pt that if she does not exercise/move at home, PT will not benefit her. Also provided information on [facility] as pt inquiring about an eye doctor specializing  in neuro diagnoses Person educated: Patient and Spouse Education method: Explanation Education comprehension: needs further education  HOME EXERCISE PROGRAM: To be reviewed and updated from previous POC.  Access Code: [email or web address]   GOALS: Goals reviewed with patient? Yes  STG = LTG due to POC length    LONG TERM GOALS: Target date: 10/07/2022   Pt will be independent with final HEP for improved strength, balance, transfers and gait.  Baseline: to be reviewed from previous POC Goal status: INITIAL  2.  Oswestry to be assessed and goal updated  Baseline:  Goal status: INITIAL  3.  Pt will improve 5 x STS to less than or equal to 22 seconds w/no retropulsion to demonstrate improved functional strength and transfer efficiency.   Baseline: 25.03s w/BUE support and severe retropulsion  Goal status: INITIAL  ASSESSMENT:  CLINICAL IMPRESSION: Patient is a 72 year old female referred to Neuro [facility] for  PD and low back pain.  Pt's PMH is significant for: Anxiety, Depression, Dizziness, HL (hearing loss), Hyperlipidemia, Hypertension, Sarcoidosis and Tinnitus. The following deficits were present during the exam: decreased strength, impaired sensation, decreased activity tolerance, decreased safety awareness, dizziness and impaired visual acuity. Based on 5x STS, falls history and PD, pt is an incr risk for falls. Pt would benefit from skilled PT to address these impairments and functional limitations to maximize functional mobility independence.    OBJECTIVE IMPAIRMENTS: Abnormal gait, decreased activity tolerance, decreased balance, decreased cognition, decreased coordination, decreased endurance, decreased knowledge of condition, decreased knowledge of use of DME, decreased mobility, difficulty walking, decreased strength, decreased safety awareness, dizziness, impaired sensation, impaired vision/preception, improper body mechanics, and pain.   ACTIVITY LIMITATIONS: carrying, lifting, bending, sitting, standing, squatting, sleeping, stairs, transfers, bed mobility, reach over head, hygiene/grooming, and locomotion level  PARTICIPATION LIMITATIONS: meal prep, cleaning, laundry, medication management, interpersonal relationship, driving, shopping, community activity, and yard work  PERSONAL FACTORS: Age, Behavior pattern, Fitness, Past/current experiences, and 1-2 comorbidities: Sarcoidosis and PD  are also affecting patient's functional outcome.   REHAB POTENTIAL: Fair due to pt's sedentary lifestyle   CLINICAL DECISION MAKING: Evolving/moderate complexity  EVALUATION COMPLEXITY: Moderate  PLAN:  PT FREQUENCY: 1x/week  PT DURATION: 4 weeks (POC written for 6 weeks due to delay in scheduling)   PLANNED INTERVENTIONS: Therapeutic exercises, Therapeutic activity, Neuromuscular re-education, Balance training, Gait training, Patient/Family education, Self Care, Joint mobilization, Stair training,  Vestibular training, Canalith repositioning, DME instructions, Aquatic Therapy, Dry Needling, Manual therapy, and Re-evaluation  PLAN FOR NEXT SESSION: Modified Oswestry and update goal. Review and add to HEP from previous POC, SIT TO STANDS. Establish walking program    Jill Alexanders , PT, DPT 09/02/2022, 9:18 PM

## 2022-09-03 ENCOUNTER — Telehealth: Payer: Self-pay | Admitting: Physical Therapy

## 2022-09-08 NOTE — Progress Notes (Unsigned)
Cardiology Office Note   Date:  09/11/2022   ID:  Elida, Desautels [birth year], MRN [id number]  PCP:  Lorenda Ishihara, MD  Cardiologist:   Dietrich Pates, MD    Pt presents for follow up of CAD, HTN, SOB   History of Present Illness: [place] is a 72 y.o. female with a history of Parkinsons, HTN, HL, sarcoid and restrictive lung dz. Followed in Pulmonary clinic  and neurology     In 2015 pt had a myoview that was normal an echo that showed normal LV function   Due to dyspnea she went on to have a R and L heart cath in 2014  R heart cath showed upper normal R heart pressures with  PAP 39/18  PCWP 14   L heart cath showed LAD  30 to 40%lesion ; LCx normal  RCA 10%   Vigorous LVEF    Stress echoes at [facility] in 2019 and 2022  were normal PT walk 6 min in 2022 In 2021 and 2023 the pt had a cardiac PET scan at [facility] that was negative for cardiac involvement by sarcoi   I saw the pt in clinci in Feb 2024  Since seen the pt denies CP   She does have back pain and will have some SOB with this    ALso has dizzy/unsteady spells     Malaise   She attrib some to visual problems.   She was just seen by neuroopthy, says she was told she had involvement by Parkinsons.    The pt recently had a mammogram at [facility]   Calcifications in breast noted   Recomm she have a Calcium score CT   This was done 07/24/22.   Ca score was 1851 (98th percentile for sex/age.   LM 252; LAD 244; LCx 350 RCA 1005)  Atherosclerosis of aorta noted    Current Meds  Medication Sig   acetaminophen (TYLENOL) 500 MG tablet Take 500 mg by mouth every 6 (six) hours as needed for mild pain.   Adalimumab (HUMIRA, 2 PEN,) 40 MG/0.4ML PNKT Inject 40 mg into the skin every 14 (fourteen) days. 1 kit - 2 pens   Amantadine HCl 100 MG tablet Take 100 mg by mouth in the morning, at noon, and at bedtime.   aspirin EC 81 MG tablet Take 81 mg by mouth daily.   Biotin 1 MG CAPS Take 1 mg by mouth daily at 2 PM.    carbidopa-levodopa (SINEMET IR) 25-100 MG tablet Take 0.5-1 tablets by mouth See admin instructions. Take 1 tablet by mouth in the morning and then take half tablet by mouth at noon, and 4 pm and at bedtime per patient   Carbidopa-Levodopa ER (SINEMET CR) 25-100 MG tablet controlled release Take 1 tablet by mouth 2 (two) times daily.   gabapentin (NEURONTIN) 100 MG capsule Take 200 mg by mouth 2 (two) times daily.   memantine (NAMENDA) 5 MG tablet Take 10 mg by mouth 2 (two) times daily. Patient has not started it yet as she wants to know more about it   rOPINIRole (REQUIP) 1 MG tablet Take 1 tablet (1 mg total) by mouth 3 (three) times daily. (Patient taking differently: Take 1 mg by mouth in the morning and at bedtime.)   senna (SENOKOT) 8.6 MG TABS tablet Take 1 tablet (8.6 mg total) by mouth 2 (two) times daily.   sertraline (ZOLOFT) 100 MG tablet Take 1 tablet by mouth daily.   [DISCONTINUED] rosuvastatin (  CRESTOR) 10 MG tablet TAKE 1 TABLET BY MOUTH EVERY DAY     Allergies:   Myrbetriq [mirabegron], Atorvastatin, and Zocor [simvastatin]   Past Medical History:  Diagnosis Date   Abdominal pain, epigastric 09/18/2009   Anosmia 11/06/2010   ANXIETY 09/23/2006   BUNION, RIGHT FOOT 09/18/2009   CARPAL TUNNEL SYNDROME, BILATERAL 09/23/2006   DEPRESSION 09/23/2006   DIVERTICULOSIS, COLON 09/23/2006   GERD 05/24/2008   GLUCOSE INTOLERANCE 05/24/2008   Hemorrhoids    Hypercalcemia    HYPERLIPIDEMIA 09/23/2006   HYPERTENSION 09/23/2006   IBS 09/23/2006   Impaired glucose tolerance 11/04/2010   OSTEOPOROSIS 05/24/2008   06/01/19-pt states osteopenia   Parkinson's disease    Restrictive lung disease 12/20/2013   Sarcoidosis 02/26/2019    Past Surgical History:  Procedure Laterality Date   ABDOMINAL EXPOSURE N/A 10/29/2021   Procedure: LATERAL EXPOSURE FOR OBLIQUE SPINE SURGERY;  Surgeon: Cephus Shelling, MD;  Location: [facility] OR;  Service: Vascular;  Laterality: N/A;   CARDIAC CATHETERIZATION      CATARACT EXTRACTION     COLONOSCOPY  2008   COLONOSCOPY W/ BIOPSIES  2008   Dr. Loreta Ave -negative random bxs   ESOPHAGOGASTRODUODENOSCOPY  2008   Dr. Jeral Fruit duodenal ulcer   FOOT SURGERY Bilateral 2015   LEFT AND RIGHT HEART CATHETERIZATION WITH CORONARY ANGIOGRAM N/A 11/09/2013   Procedure: LEFT AND RIGHT HEART CATHETERIZATION WITH CORONARY ANGIOGRAM;  Surgeon: Peter M Swaziland, MD;  Location: [facility] CATH LAB;  Service: Cardiovascular;  Laterality: N/A;   nasal septum repair     OBLIQUE LUMBAR INTERBODY FUSION 1 LEVEL WITH PERCUTANEOUS SCREWS N/A 10/29/2021   Procedure: OBLIQUE LUMBAR INTERBODY FUSION WITH SUPPLEMENTAL PEDICLE SCREW FIXATION LUMBAR FOUR TO FIVE;  Surgeon: Venita Lick, MD;  Location: [facility] OR;  Service: Orthopedics;  Laterality: N/A;   s/p bilat CTS     s/p fibroid ablation     s/p ganglion cyst right wrist     s/p right thumb trigger finger surgury     SHOULDER SURGERY Right 2015   TUBAL LIGATION     UPPER GASTROINTESTINAL ENDOSCOPY       Social History:  The patient  reports that she has never smoked. She has been exposed to tobacco smoke. She has never used smokeless tobacco. She reports that she does not currently use alcohol. She reports that she does not use drugs.   Family History:  The patient's family history includes Heart attack in her brother and mother; Heart disease (age of onset: 31) in her father; Hypertension in her brother, brother, brother, and father; Peripheral vascular disease in her mother.    ROS:  Please see the history of present illness. All other systems are reviewed and  Negative to the above problem except as noted.    PHYSICAL EXAM: VS:  BP (!) 140/82   Pulse 85   Ht 4' 9.5" (1.461 m)   Wt 135 lb (61.2 kg)   SpO2 95%   BMI 28.71 kg/m     GEN: Overweight 71 yo in NAD  Examined in chair HEENT: normal  Neck: no JVD, no carotid bruits Cardiac: RRR; no murmur, No LE edema  Respiratory:  clear to auscultation  GI: soft, nontender, No  hepatomegaly  MS: no deformity Moving all extremities     EKG:  EKG is not ordered today.  Stress echo 09/2020   1. Stress echocardiogram with no chest pain, normal BP response, no ST  changes and no stress-induced wall motion abnormalities; normal study.  2. This is a negative stress echocardiogram for ischemia.   3. This is a low risk study.    PET scan 12/2019    FINAL COMMENTS   1- Cardiac PET/CT Metabolic Study:   No Evidence of Increased F-18 FDG in The Myocardium To Suggest Inflamation. No Evidence of Active Inflamatory Process   Suuc As Active Sarcoid or Myocarditis.    2- Cardiac PET/CT Myocardial Perfusion Imaging Study Demonstrate No Evidence of Perfusion Abnormalities.    3- Gated Cardiac PET/CT Functional Study Demonstrate Normal Left Ventricular Ejection Fraction at Rest as Well as Normal Left   Ventricular Volumes.    3- Gated Cardiac PET/CT Functional Study Demonstrate Normal Left Ventricular Ejection Fraction at Rest as Well as Normal Left   Ventricular Volumes.    4- Gated Cardiac PET/CT Functional Study Demonstrate Normal Left Ventricular Function and Wall Thickening.    5- No Evidence of Increased Tracer Uptake in The Right Ventricular Free Wall.   Echo  07/16/17   RESTING ECHOCARDIOGRAPHIC MEASUREMENTS----------------------------------------    2D DIMENSIONS   AORTA          Values  Normal Range      Aorta Sin   2.7 cm  [2.7 - 3.3]   LEFT VENTRICLE          LVIDd   3.7 cm  [3.9 - 5.3]          LVIDs   3.1 cm             FS  16%      [> 25%]            SWT   1.2 cm  [0.6 - 1.0]            PWT   1.1 cm  [0.6 - 1.0]   LEFT ATRIUM        LA Diam   2.8 cm  [2.7 - 3.8]   RESTING ECHOCARDIOGRAPHIC DESCRIPTIONS ---------------------------------------    LEFT VENTRICLE           Size: Normal    Contraction: Normal                     LV Masses: No Masses            LVH: MILD LVH CONCENTRIC     Dias.Class: Normal    RIGHT VENTRICLE           Size:  Normal                     Free Wall: Normal    Contraction: Normal                     RV Masses: No Masses    PERICARDIUM          Fluid: No effusion   RESTING DOPPLER ECHO and OTHER SPECIAL PROCEDURES ----------------------------      Aortic: No AR                  No AS      Mitral: TRIVIAL MR             No MS   Tricuspid: TRIVIAL TR             No TS   Pulmonary: TRIVIAL PR             No PS      Other:  DEFINITY CONTRAST SHOWS ENHANCED LV BORDERS   STRESS ECHOCARDIOGRAPHY ------------------------------------------------------            Protocol: Treadmill (Bruce)              Drugs: None  Target Heart Rate: 129  Maximum Predicted Heart Rate: 152        Resting ECG: Normal; Sinus rhythm   TYPE     STAGE    TIME    HR    BP    RPE  SPO2 COMMENTS  -------- ----- --------- --- ------- ----- ---- ------------------------------  Baseline                 101 132/ 98  BaseLine   1             107 132/ 98            Definity 2ml given IV push  BaseLine   2             104 124/ 66  Stress     1    180 sec. 130 154/ 76  7/20  Stress     2    180 sec. 144 175/ 75 12/20  Stress     3     20 sec. 144    /               Definity 2ml given IV push  Stress     4     10 sec. 144    /  Recovery   1      1 min. 136 193/ 72  Recovery   2      2 min. 122    /  Recovery   3      4 min. 110 162/ 74  Recovery   4      6 min. 107 162/ 74  Recovery   5      8 min. 106 152/ 75  Recovery   6     10 min. 105 136/ 79     Stress Duration: 6.50 minutes.  Max Stress H.R: 144   Target Heart Rate (129) Achieved: Yes    Max. workload of  8.90  METs was achieved during exercise.        HR Response: Appropriate        BP Response: Normal resting BP - appropriate response   WALL SEGMENT FINDINGS --------------------------------------------------------                          Rest            Stress                         --------------- ---------------  Anterior Septum Basal: Normal           Hyperkinetic                    Mid: Normal          Hyperkinetic          Apical Septum: Normal          Hyperkinetic     Anterior Wall Basal: Normal          Hyperkinetic                    Mid: Normal  Hyperkinetic                 Apical: Normal          Hyperkinetic      Lateral Wall Basal: Normal          Hyperkinetic                    Mid: Normal          Hyperkinetic                 Apical: Normal          Hyperkinetic    Posterior Wall Basal: Normal          Hyperkinetic                    Mid: Normal          Hyperkinetic     Inferior Wall Basal: Normal          Hyperkinetic                    Mid: Normal          Hyperkinetic                 Apical: Normal          Hyperkinetic   Inferior Septum Basal: Normal          Hyperkinetic                    Mid: Normal          Hyperkinetic                      EF: >55%            >55%    ADDITIONAL FINDINGS ----------------------------------------------------------     Chest Discomfort: None          Arrhythmia: None  Termination Reason: Fatigue       Complications: None   STRESS ECG RESULTS -----------------------------------------------------------    ECG Results: Normal. .   INTERPRETATION ---------------------------------------------------------------    NORMAL STRESS TEST. NORMAL RESTING STUDY WITH NO WALL MOTION ABNORMALITIES   AT REST AND PEAK STRESS.   NORMAL LA PRESSURES WITH NORMAL DIASTOLIC FUNCTION   VALVULAR REGURGITATION: TRIVIAL MR, TRIVIAL PR, TRIVIAL TR   NO VALVULAR STENOSIS   Maximum workload of  8.90 METs was achieved during exercise.   NORMAL RESTING BP - APPROPRIATE RESPONSE   Lipid Panel    Component Value Date/Time   CHOL 106 02/06/2019 0920   TRIG 240 (H) 02/06/2019 0920   HDL <10 (L) 02/06/2019 0920   CHOLHDL NOT CALCULATED 02/06/2019 0920   VLDL 48 (H) 02/06/2019 0920   LDLCALC NOT CALCULATED 02/06/2019 0920   LDLDIRECT 104.0 07/07/2014 1225      Wt Readings from  Last 3 Encounters:  09/11/22 135 lb (61.2 kg)  07/24/22 127 lb (57.6 kg)  04/24/22 132 lb 9.6 oz (60.1 kg)      ASSESSMENT AND PLAN:  1  CAD   Pt with extensive coronary calcium   Stress echo in 2022 was normal    Pt without CP suggestive of angina    FOllow   Rx risk factors  2   HL   LDL 70   ON Crestor    I would increase to 40 mg  for tighter control  Follow up labs in 8 wks  3  HTN  BP is labile   Better in June   I have asked her to follow   with hx of labile readings and also dizziness I am hesitant to push lower unless truly elevatedd   4  Hx Parkinsons   On medical Rx    Will get notes from ophthy  5  Hx sarcoid   Followed in pulmonary    So far no evid for cardiac involvement    Plan for follow up in clnic next spring    Current medicines are reviewed at length with the patient today.  The patient does not have concerns regarding medicines.  Signed, Dietrich Pates, MD  09/11/2022 7:13 PM    [facility] [address] [place], [place], [place]  [address] Phone: [phone]; Fax: [phone]

## 2022-09-09 DIAGNOSIS — H532 Diplopia: Secondary | ICD-10-CM | POA: Diagnosis not present

## 2022-09-09 DIAGNOSIS — G219 Secondary parkinsonism, unspecified: Secondary | ICD-10-CM | POA: Diagnosis not present

## 2022-09-09 DIAGNOSIS — H5111 Convergence insufficiency: Secondary | ICD-10-CM | POA: Diagnosis not present

## 2022-09-11 ENCOUNTER — Encounter: Payer: Self-pay | Admitting: Internal Medicine

## 2022-09-11 ENCOUNTER — Ambulatory Visit: Payer: Medicare Other | Attending: Internal Medicine | Admitting: Internal Medicine

## 2022-09-11 VITALS — BP 140/82 | HR 85 | Ht <= 58 in | Wt 135.0 lb

## 2022-09-11 DIAGNOSIS — E785 Hyperlipidemia, unspecified: Secondary | ICD-10-CM | POA: Diagnosis not present

## 2022-09-11 DIAGNOSIS — I2510 Atherosclerotic heart disease of native coronary artery without angina pectoris: Secondary | ICD-10-CM | POA: Insufficient documentation

## 2022-09-11 MED ORDER — ROSUVASTATIN CALCIUM 20 MG PO TABS: 20.0000 mg | ORAL_TABLET | Freq: Every day | ORAL | 3 refills | Status: DC

## 2022-09-11 NOTE — Patient Instructions (Signed)
Medication Instructions:  Increase Crestor to 20 mg a day  *If you need a refill on your cardiac medications before your next appointment, please call your pharmacy*   Lab Work: NMR AND HEPATIC IN 8 WEEKS  If you have labs (blood work) drawn today and your tests are completely normal, you will receive your results only by: MyChart Message (if you have MyChart) OR A paper copy in the mail If you have any lab test that is abnormal or we need to change your treatment, we will call you to review the results.   Testing/Procedures:    Follow-Up: At [facility], you and your health needs are our priority.  As part of our continuing mission to provide you with exceptional heart care, we have created designated Provider Care Teams.  These Care Teams include your primary Cardiologist (physician) and Advanced Practice Providers (APPs -  Physician Assistants and Nurse Practitioners) who all work together to provide you with the care you need, when you need it.  We recommend signing up for the patient portal called "MyChart".  Sign up information is provided on this After Visit Summary.  MyChart is used to connect with patients for Virtual Visits (Telemedicine).  Patients are able to view lab/test results, encounter notes, upcoming appointments, etc.  Non-urgent messages can be sent to your provider as well.   To learn more about what you can do with MyChart, go to [email or web address].    Your next appointment:   5 month(s) FEB 2025  Provider:   DR Dietrich Pates      Other Instructions

## 2022-09-13 ENCOUNTER — Encounter: Payer: Self-pay | Admitting: Internal Medicine

## 2022-09-16 MED ORDER — ROSUVASTATIN CALCIUM 40 MG PO TABS: 40.0000 mg | ORAL_TABLET | Freq: Every day | ORAL | 3 refills | Status: DC

## 2022-09-18 ENCOUNTER — Encounter: Payer: Self-pay | Admitting: Physical Therapy

## 2022-09-18 ENCOUNTER — Ambulatory Visit: Payer: Medicare Other | Admitting: Physical Therapy

## 2022-09-18 DIAGNOSIS — R293 Abnormal posture: Secondary | ICD-10-CM | POA: Diagnosis not present

## 2022-09-18 DIAGNOSIS — R2681 Unsteadiness on feet: Secondary | ICD-10-CM | POA: Diagnosis not present

## 2022-09-18 DIAGNOSIS — R42 Dizziness and giddiness: Secondary | ICD-10-CM | POA: Diagnosis not present

## 2022-09-18 DIAGNOSIS — R269 Unspecified abnormalities of gait and mobility: Secondary | ICD-10-CM | POA: Diagnosis not present

## 2022-09-18 DIAGNOSIS — M6281 Muscle weakness (generalized): Secondary | ICD-10-CM

## 2022-09-18 DIAGNOSIS — M5459 Other low back pain: Secondary | ICD-10-CM | POA: Diagnosis not present

## 2022-09-18 NOTE — Therapy (Signed)
OUTPATIENT PHYSICAL THERAPY NEURO TREATMENT   Patient Name: Destiny Roth MRN: [id number] DOB:02-26-1950, 72 y.o., female Today's Date: 09/18/2022   PCP: Lorenda Ishihara, MD REFERRING PROVIDER: Brion Aliment, FNP  END OF SESSION:  PT End of Session - 09/18/22 1406     Visit Number 2    Number of Visits 5    Date for PT Re-Evaluation 10/14/22    Authorization Type Medicare    PT Start Time 1403    PT Stop Time 1445    PT Time Calculation (min) 42 min    Activity Tolerance Patient tolerated treatment well    Behavior During Therapy Anxious;WFL for tasks assessed/performed             Past Medical History:  Diagnosis Date   Abdominal pain, epigastric 09/18/2009   Anosmia 11/06/2010   ANXIETY 09/23/2006   BUNION, RIGHT FOOT 09/18/2009   CARPAL TUNNEL SYNDROME, BILATERAL 09/23/2006   DEPRESSION 09/23/2006   DIVERTICULOSIS, COLON 09/23/2006   GERD 05/24/2008   GLUCOSE INTOLERANCE 05/24/2008   Hemorrhoids    Hypercalcemia    HYPERLIPIDEMIA 09/23/2006   HYPERTENSION 09/23/2006   IBS 09/23/2006   Impaired glucose tolerance 11/04/2010   OSTEOPOROSIS 05/24/2008   06/01/19-pt states osteopenia   Parkinson's disease    Restrictive lung disease 12/20/2013   Sarcoidosis 02/26/2019   Past Surgical History:  Procedure Laterality Date   ABDOMINAL EXPOSURE N/A 10/29/2021   Procedure: LATERAL EXPOSURE FOR OBLIQUE SPINE SURGERY;  Surgeon: Cephus Shelling, MD;  Location: [facility] OR;  Service: Vascular;  Laterality: N/A;   CARDIAC CATHETERIZATION     CATARACT EXTRACTION     COLONOSCOPY  2008   COLONOSCOPY W/ BIOPSIES  2008   Dr. Loreta Ave -negative random bxs   ESOPHAGOGASTRODUODENOSCOPY  2008   Dr. Jeral Fruit duodenal ulcer   FOOT SURGERY Bilateral 2015   LEFT AND RIGHT HEART CATHETERIZATION WITH CORONARY ANGIOGRAM N/A 11/09/2013   Procedure: LEFT AND RIGHT HEART CATHETERIZATION WITH CORONARY ANGIOGRAM;  Surgeon: Peter M Swaziland, MD;  Location: [facility] CATH LAB;  Service:  Cardiovascular;  Laterality: N/A;   nasal septum repair     OBLIQUE LUMBAR INTERBODY FUSION 1 LEVEL WITH PERCUTANEOUS SCREWS N/A 10/29/2021   Procedure: OBLIQUE LUMBAR INTERBODY FUSION WITH SUPPLEMENTAL PEDICLE SCREW FIXATION LUMBAR FOUR TO FIVE;  Surgeon: Venita Lick, MD;  Location: [facility] OR;  Service: Orthopedics;  Laterality: N/A;   s/p bilat CTS     s/p fibroid ablation     s/p ganglion cyst right wrist     s/p right thumb trigger finger surgury     SHOULDER SURGERY Right 2015   TUBAL LIGATION     UPPER GASTROINTESTINAL ENDOSCOPY     Patient Active Problem List   Diagnosis Date Noted   S/P lumbar fusion 10/29/2021   Small fiber neuropathy 11/20/2020   Lumbar radiculopathy 06/28/2020   Chronic neck pain 06/20/2020   Degenerative spondylolisthesis 06/20/2020   High risk medication use 04/27/2020   Panuveitis of both eyes 04/27/2020   Parkinson's disease 09/19/2019   Optic nerve edema 07/06/2019   Peripheral focal chorioretinal inflammation of both eyes 07/06/2019   Retinal edema 07/06/2019   Therapeutic drug monitoring 05/26/2019   Visual disturbance 04/27/2019   Sarcoidosis 02/26/2019   Hypercalcemia 02/04/2019   Generalized weakness 02/04/2019   Left-sided vestibular weakness 07/24/2017   Moderate episode of recurrent major depressive disorder (HCC) 05/28/2017   RBD (REM behavioral disorder) 10/16/2016   Chronic depression 10/16/2016   Fatigue 10/16/2016   Vertigo 03/12/2016  Pulmonary air trapping 02/24/2015   DOE (dyspnea on exertion) 11/18/2013   Hot flashes 11/18/2013   Memory impairment 08/04/2013   Orthostasis 02/26/2013   Tachycardia 02/24/2013   Coronary artery calcification seen on CAT scan 01/01/2013   Tendonitis, calcific, shoulder 01/01/2013   OSA (obstructive sleep apnea) 01/01/2013   PD (Parkinson's disease) (HCC) 11/26/2011   Hematochezia 11/09/2011   Colon polyps 11/09/2011   IBS (irritable bowel syndrome) 11/09/2011   Anosmia 11/06/2010    Impaired glucose tolerance 11/04/2010   Hearing loss 05/07/2010   Preventative health care 05/07/2010   BUNION, RIGHT FOOT 09/18/2009   GERD 05/24/2008   OSTEOPOROSIS 05/24/2008   Hyperlipidemia 09/23/2006   Anxiety state 09/23/2006   Depression 09/23/2006   CARPAL TUNNEL SYNDROME, BILATERAL 09/23/2006   Essential hypertension 09/23/2006   DIVERTICULOSIS, COLON 09/23/2006   Carpal tunnel syndrome, bilateral 09/23/2006    ONSET DATE: 08/08/2022  REFERRING DIAG: G20.B1 (ICD-10-CM) - Parkinson's disease with dyskinesia, without mention of fluctuations  THERAPY DIAG:  Unsteadiness on feet  Abnormality of gait and mobility  Muscle weakness (generalized)  Rationale for Evaluation and Treatment: Rehabilitation  SUBJECTIVE:                                                                                                                                                                                             SUBJECTIVE STATEMENT: No falls. Saw Dr. Karleen Hampshire at [facility] care and had a great experience, waiting for her glasses to be picked up. Wants to feel better with her back. Perception is a big thing. Reports this is a bad day as she feels like the room is moving.   Pt accompanied by: significant other  PERTINENT HISTORY: Sarcoidosis, Parkinson's, Lumbar fusion in 10/2021    PAIN:  Are you having pain? Yes: NPRS scale: 8/10 Pain location: Back and it spreads to upper and mid back  Pain description: Low back is aching, top feels like muscles are being pulled  Aggravating factors: Standing, moving from place to place  Relieving factors: Unsure  PRECAUTIONS: Fall  RED FLAGS: None   WEIGHT BEARING RESTRICTIONS: No  FALLS: Has patient fallen in last 6 months? Yes. Number of falls ~6   LIVING ENVIRONMENT: Lives with: lives with their spouse Lives in: House/apartment Stairs: Yes: Internal: sunken living room steps; on right going up, on left going up, and can reach both and  External: 3 steps; on right going up Has following equipment at home: Dan Humphreys - 2 wheeled and shower chair  PLOF: Requires assistive device for independence, Needs assistance with homemaking, and Needs assistance with transfers  PATIENT GOALS: "To work  on my back" "I want to be able to get more strength in my back and less pain"   OBJECTIVE:   DIAGNOSTIC FINDINGS: None relevant on file   COGNITION: Overall cognitive status: Impaired   SENSATION: Pt reports complete numbness in BLEs and distal BUEs    POSTURE: rounded shoulders and forward head   BED MOBILITY:  Pt reports husband built her a step with a platform to get into bed. Requires assistance when she is tired   TRANSFERS: Assistive device utilized:  RW w/casters placed on front legs and wheels placed on back legs    Sit to stand: CGA and Min A Stand to sit: CGA Pt demonstrates decreased anterior lean and pulling up on RW to stand, husband holds RW to stabilize   GAIT: Gait pattern: step through pattern, decreased stride length, decreased hip/knee flexion- Right, decreased hip/knee flexion- Left, decreased trunk rotation, trunk flexed, poor foot clearance- Right, and poor foot clearance- Left Distance walked: Various clinic distances  Assistive device utilized:  RW w/casters placed on front legs and wheels placed on back legs Level of assistance: SBA Comments: Increased difficulty w/turns, heavy reliance on BUE support   TODAY'S TREATMENT:                                                                                                                      PATIENT SURVEYS:  Modified Oswestry: 24/50 = Moderate Disability   Pt unable to read handout, so PT had to read it out loud to pt, took incr time. Pt needs reminders throughout to answer questions in regards to her back pack, rather than other co-morbidities.    Access Code: [email or web address] URL: [email or web address] Date: 09/18/2022 Prepared by: Sherlie Ban  Exercises  - Sit to Stand with Armchair  - 2 x daily - 5 x weekly - 1-2 sets - 10 reps -began to review previous HEP with beginning with sit <> stands, pt needing max verbal/multi-modal cues for proper technique with scooting out towards the edge, tucking feet back, and incr forward lean with nose over toes. Attempted to perform without UE support, but pt unable to at this time, cued to press up from chair vs. Grabbing onto RW. Pt needs proper cues to perform each time, performed 10 reps. Cued to stand will tall posture and slow descent to chair.   Discussed importance of walking program daily in the house for 2-3 times a day with RW, beginning with 2-3 minutes and then gradually incr the time that pt walks to build up endurance as pt is currently not doing any walking at home. Pt asking about ambulating outdoors over grass with RW, discussed safest is going to be in the house over level surfaces.   PATIENT EDUCATION: Education details: Answered pt's questions during session regarding PD symptoms, purpose of vestibular exercises from previous POC, importance of moving and walking more at home to help decr pain and stiffness and improve functional mobility, answered pt's questions regarding more forward flexed posture.  Began to review HEP (did not get to finish reviewing exercises due to time constraints), provided walking program and importance of walking daily with RW  Person educated: Patient and Spouse Education method: Explanation, Demonstration, Tactile cues, Verbal cues, and Handouts Education comprehension: returned demonstration, verbal cues required, tactile cues required, and needs further education  HOME EXERCISE PROGRAM: Walking program with RW (see pt instructions for more details) Access Code: [email or web address] URL: [email or web address] Date: 09/18/2022 Prepared by: Sherlie Ban  Exercises  - Sit to Stand with Armchair  - 2 x daily - 5 x weekly - 1-2 sets - 10  reps    GOALS: Goals reviewed with patient? Yes  STG = LTG due to POC length    LONG TERM GOALS: Target date: 10/07/2022   Pt will be independent with final HEP for improved strength, balance, transfers and gait.  Baseline: to be reviewed from previous POC Goal status: INITIAL  2.  Pt will decr ODI to a 20/50 or less in order to demo decr disability with low back pain.  Baseline: 24/50 Goal status: INITIAL  3.  Pt will improve 5 x STS to less than or equal to 22 seconds w/no retropulsion to demonstrate improved functional strength and transfer efficiency.   Baseline: 25.03s w/BUE support and severe retropulsion  Goal status: INITIAL  ASSESSMENT:  CLINICAL IMPRESSION: Had pt fill out ODI at start of session for low back pain. This survey took incr time as PT had to read it to pt out loud as pt was not able to read it, and pt had to be reminded to answer questions based on low back pain, vs other co-morbidities. Pt scored a 24/50 = moderate disability. LTG updated as appropriate. Remainder of session focused on initiating a walking program for home with RW and reviewing previous HEP. PT answering pt's questions throughout session, so did not have time to review remainder of pt's HEP. Pt able to verbalize understanding of purpose of moving more at home. Will continue per POC.   OBJECTIVE IMPAIRMENTS: Abnormal gait, decreased activity tolerance, decreased balance, decreased cognition, decreased coordination, decreased endurance, decreased knowledge of condition, decreased knowledge of use of DME, decreased mobility, difficulty walking, decreased strength, decreased safety awareness, dizziness, impaired sensation, impaired vision/preception, improper body mechanics, and pain.   ACTIVITY LIMITATIONS: carrying, lifting, bending, sitting, standing, squatting, sleeping, stairs, transfers, bed mobility, reach over head, hygiene/grooming, and locomotion level  PARTICIPATION LIMITATIONS: meal  prep, cleaning, laundry, medication management, interpersonal relationship, driving, shopping, community activity, and yard work  PERSONAL FACTORS: Age, Behavior pattern, Fitness, Past/current experiences, and 1-2 comorbidities: Sarcoidosis and PD  are also affecting patient's functional outcome.   REHAB POTENTIAL: Fair due to pt's sedentary lifestyle   CLINICAL DECISION MAKING: Evolving/moderate complexity  EVALUATION COMPLEXITY: Moderate  PLAN:  PT FREQUENCY: 1x/week  PT DURATION: 4 weeks (POC written for 6 weeks due to delay in scheduling)   PLANNED INTERVENTIONS: Therapeutic exercises, Therapeutic activity, Neuromuscular re-education, Balance training, Gait training, Patient/Family education, Self Care, Joint mobilization, Stair training, Vestibular training, Canalith repositioning, DME instructions, Aquatic Therapy, Dry Needling, Manual therapy, and Re-evaluation  PLAN FOR NEXT SESSION: Review and add to HEP from previous POC for vestibular input , SIT TO STANDS. Establish walking program, SciFit/NuStep for improved activity or low back stretches??    Drake Leach, PT, DPT 09/18/2022, 3:06 PM

## 2022-09-25 ENCOUNTER — Encounter: Payer: Self-pay | Admitting: Physical Therapy

## 2022-09-25 ENCOUNTER — Ambulatory Visit: Payer: Medicare Other | Admitting: Physical Therapy

## 2022-09-25 VITALS — BP 100/66 | HR 91

## 2022-09-25 DIAGNOSIS — R293 Abnormal posture: Secondary | ICD-10-CM

## 2022-09-25 DIAGNOSIS — R269 Unspecified abnormalities of gait and mobility: Secondary | ICD-10-CM | POA: Diagnosis not present

## 2022-09-25 DIAGNOSIS — R2681 Unsteadiness on feet: Secondary | ICD-10-CM

## 2022-09-25 DIAGNOSIS — M5459 Other low back pain: Secondary | ICD-10-CM | POA: Diagnosis not present

## 2022-09-25 DIAGNOSIS — R42 Dizziness and giddiness: Secondary | ICD-10-CM

## 2022-09-25 DIAGNOSIS — M6281 Muscle weakness (generalized): Secondary | ICD-10-CM | POA: Diagnosis not present

## 2022-09-25 NOTE — Patient Instructions (Signed)
   Gaze Stabilization: Sitting    Keeping eyes on target on wall a  feet away, tilt head down 15-30 and move head side to side for 10 times   Repeat while moving head up and down 10 times  Perform 2-3 sets of each.  Do __1-2__ sessions per day.    Gaze Stabilization: Tip Card  1.Target must remain in focus, not blurry, and appear stationary while head is in motion. 2.Perform exercises with small head movements (45 to either side of midline). 3.These exercises may provoke dizziness or nausea. Work through these symptoms. If too dizzy, slow head movement slightly. Rest between each exercise. 4.Exercises demand concentration; avoid distractions. 5.For safety, perform standing exercises close to a counter, wall, corner, or next to someone.

## 2022-09-25 NOTE — Therapy (Signed)
OUTPATIENT PHYSICAL THERAPY NEURO TREATMENT   Patient Name: Destiny Roth MRN: [id number] DOB:March 25, 1950, 72 y.o., female Today's Date: 09/25/2022   PCP: Lorenda Ishihara, MD REFERRING PROVIDER: Brion Aliment, FNP  END OF SESSION:  PT End of Session - 09/25/22 1409     Visit Number 3    Number of Visits 5    Date for PT Re-Evaluation 10/14/22    Authorization Type Medicare    PT Start Time 1406   pt late to session   PT Stop Time 1445    PT Time Calculation (min) 39 min    Activity Tolerance Patient tolerated treatment well    Behavior During Therapy Anxious;WFL for tasks assessed/performed             Past Medical History:  Diagnosis Date   Abdominal pain, epigastric 09/18/2009   Anosmia 11/06/2010   ANXIETY 09/23/2006   BUNION, RIGHT FOOT 09/18/2009   CARPAL TUNNEL SYNDROME, BILATERAL 09/23/2006   DEPRESSION 09/23/2006   DIVERTICULOSIS, COLON 09/23/2006   GERD 05/24/2008   GLUCOSE INTOLERANCE 05/24/2008   Hemorrhoids    Hypercalcemia    HYPERLIPIDEMIA 09/23/2006   HYPERTENSION 09/23/2006   IBS 09/23/2006   Impaired glucose tolerance 11/04/2010   OSTEOPOROSIS 05/24/2008   06/01/19-pt states osteopenia   Parkinson's disease    Restrictive lung disease 12/20/2013   Sarcoidosis 02/26/2019   Past Surgical History:  Procedure Laterality Date   ABDOMINAL EXPOSURE N/A 10/29/2021   Procedure: LATERAL EXPOSURE FOR OBLIQUE SPINE SURGERY;  Surgeon: Cephus Shelling, MD;  Location: [facility] OR;  Service: Vascular;  Laterality: N/A;   CARDIAC CATHETERIZATION     CATARACT EXTRACTION     COLONOSCOPY  2008   COLONOSCOPY W/ BIOPSIES  2008   Dr. Loreta Ave -negative random bxs   ESOPHAGOGASTRODUODENOSCOPY  2008   Dr. Jeral Fruit duodenal ulcer   FOOT SURGERY Bilateral 2015   LEFT AND RIGHT HEART CATHETERIZATION WITH CORONARY ANGIOGRAM N/A 11/09/2013   Procedure: LEFT AND RIGHT HEART CATHETERIZATION WITH CORONARY ANGIOGRAM;  Surgeon: Peter M Swaziland, MD;  Location: [facility] CATH  LAB;  Service: Cardiovascular;  Laterality: N/A;   nasal septum repair     OBLIQUE LUMBAR INTERBODY FUSION 1 LEVEL WITH PERCUTANEOUS SCREWS N/A 10/29/2021   Procedure: OBLIQUE LUMBAR INTERBODY FUSION WITH SUPPLEMENTAL PEDICLE SCREW FIXATION LUMBAR FOUR TO FIVE;  Surgeon: Venita Lick, MD;  Location: [facility] OR;  Service: Orthopedics;  Laterality: N/A;   s/p bilat CTS     s/p fibroid ablation     s/p ganglion cyst right wrist     s/p right thumb trigger finger surgury     SHOULDER SURGERY Right 2015   TUBAL LIGATION     UPPER GASTROINTESTINAL ENDOSCOPY     Patient Active Problem List   Diagnosis Date Noted   S/P lumbar fusion 10/29/2021   Small fiber neuropathy 11/20/2020   Lumbar radiculopathy 06/28/2020   Chronic neck pain 06/20/2020   Degenerative spondylolisthesis 06/20/2020   High risk medication use 04/27/2020   Panuveitis of both eyes 04/27/2020   Parkinson's disease 09/19/2019   Optic nerve edema 07/06/2019   Peripheral focal chorioretinal inflammation of both eyes 07/06/2019   Retinal edema 07/06/2019   Therapeutic drug monitoring 05/26/2019   Visual disturbance 04/27/2019   Sarcoidosis 02/26/2019   Hypercalcemia 02/04/2019   Generalized weakness 02/04/2019   Left-sided vestibular weakness 07/24/2017   Moderate episode of recurrent major depressive disorder (HCC) 05/28/2017   RBD (REM behavioral disorder) 10/16/2016   Chronic depression 10/16/2016   Fatigue  10/16/2016   Vertigo 03/12/2016   Pulmonary air trapping 02/24/2015   DOE (dyspnea on exertion) 11/18/2013   Hot flashes 11/18/2013   Memory impairment 08/04/2013   Orthostasis 02/26/2013   Tachycardia 02/24/2013   Coronary artery calcification seen on CAT scan 01/01/2013   Tendonitis, calcific, shoulder 01/01/2013   OSA (obstructive sleep apnea) 01/01/2013   PD (Parkinson's disease) (HCC) 11/26/2011   Hematochezia 11/09/2011   Colon polyps 11/09/2011   IBS (irritable bowel syndrome) 11/09/2011   Anosmia  11/06/2010   Impaired glucose tolerance 11/04/2010   Hearing loss 05/07/2010   Preventative health care 05/07/2010   BUNION, RIGHT FOOT 09/18/2009   GERD 05/24/2008   OSTEOPOROSIS 05/24/2008   Hyperlipidemia 09/23/2006   Anxiety state 09/23/2006   Depression 09/23/2006   CARPAL TUNNEL SYNDROME, BILATERAL 09/23/2006   Essential hypertension 09/23/2006   DIVERTICULOSIS, COLON 09/23/2006   Carpal tunnel syndrome, bilateral 09/23/2006    ONSET DATE: 08/08/2022  REFERRING DIAG: G20.B1 (ICD-10-CM) - Parkinson's disease with dyskinesia, without mention of fluctuations  THERAPY DIAG:  Abnormality of gait and mobility  Unsteadiness on feet  Abnormal posture  Dizziness and giddiness  Rationale for Evaluation and Treatment: Rehabilitation  SUBJECTIVE:                                                                                                                                                                                             SUBJECTIVE STATEMENT: Having a good birthday, going to [employer]. Reporting feeling off from the Parkinson's today. Legs feel like they are pulling inside. Had a couple of times when she almost fell, was not using her walker. Has tried walking at home with the walker. Not doing any low back stretches for home.   Pt accompanied by: significant other  PERTINENT HISTORY: Sarcoidosis, Parkinson's, Lumbar fusion in 10/2021    PAIN:  Are you having pain? Yes: NPRS scale: 8/10 Pain location: Back and it spreads to upper and mid back  Pain description: Low back is aching, top feels like muscles are being pulled  Aggravating factors: Standing, moving from place to place  Relieving factors: Unsure  Feels like muscles of low back get a little frozen.   PRECAUTIONS: Fall  RED FLAGS: None   WEIGHT BEARING RESTRICTIONS: No  FALLS: Has patient fallen in last 6 months? Yes. Number of falls ~6   LIVING ENVIRONMENT: Lives with: lives with  their spouse Lives in: House/apartment Stairs: Yes: Internal: sunken living room steps; on right going up, on left going up, and can reach both and External: 3 steps; on right going up Has following equipment at home: Dan Humphreys -  2 wheeled and shower chair  PLOF: Requires assistive device for independence, Needs assistance with homemaking, and Needs assistance with transfers  PATIENT GOALS: "To work on my back" "I want to be able to get more strength in my back and less pain"   OBJECTIVE:   DIAGNOSTIC FINDINGS: None relevant on file   COGNITION: Overall cognitive status: Impaired   SENSATION: Pt reports complete numbness in BLEs and distal BUEs    POSTURE: rounded shoulders and forward head   BED MOBILITY:  Pt reports husband built her a step with a platform to get into bed. Requires assistance when she is tired   TRANSFERS: Assistive device utilized:  RW w/casters placed on front legs and wheels placed on back legs    Sit to stand: CGA and Min A Stand to sit: CGA Pt demonstrates decreased anterior lean and pulling up on RW to stand, husband holds RW to stabilize   GAIT: Gait pattern: step through pattern, decreased stride length, decreased hip/knee flexion- Right, decreased hip/knee flexion- Left, decreased trunk rotation, trunk flexed, poor foot clearance- Right, and poor foot clearance- Left Distance walked: Various clinic distances  Assistive device utilized:  RW w/casters placed on front legs and wheels placed on back legs Level of assistance: SBA Comments: Increased difficulty w/turns, heavy reliance on BUE support   TODAY'S TREATMENT:       Vitals:   09/25/22 1416  BP: 100/66  Pulse: 91   When ambulating back into clinic, pt needs cues to turn with RW when going to sit down in chair as pt with tendency to just leave it behind   Gaze Adaptation: x1 Viewing Horizontal: Position: Seated,  Reps: 10 reps, and Comment: attempted to perform multiple reps, reviewed from  previous HEP, pt needing max multi-modal cues for technique and keeping eyes on the target and just moving her head vs. Entire upper body. Pt did well with cue for "look at the X and say no the X", pt was able to keep her eyes on the target more this way  x1 Viewing Vertical:  Position: Seated,Reps: 10 reps, and Comment: performed 2 sets with pt able to perform much easier than side to side, needing minimal cues   Habituation: Seated head turns 10 reps, 3/10 dizziness  Seated head nods 10 reps, no additional symptoms   Access Code: [email or web address] URL: [email or web address] Date: 09/25/2022 Prepared by: Sherlie Ban  Reviewed bolded vestibular exercises and added lumbar stretches below. See MedBridge for further details   Exercises - Seated Left Head Turns Vestibular Habituation  - 1-2 x daily - 5 x weekly - 2 sets - 10 reps - Seated Head Nods Vestibular Habituation  - 1-2 x daily - 5 x weekly - 2 sets - 10 reps - Supine Single Knee to Chest Stretch  - 1-2 x daily - 5 x weekly - 3 sets - 30 hold  -cues to hold for a stretch  - Lower Trunk Rotations  - 2 x daily - 5 x weekly - 1-2 sets - 10 reps - cues to keep hips down on the mat when performing  - Sit to Stand with Armchair  - 2 x daily - 5 x weekly - 1-2 sets - 10 reps   Attempted supine piriformis stretch in supine position, but pt reporting no stretch bilaterally    PATIENT EDUCATION: Education details: Importance of walking program at home and continuing to perform, review of vestibular exercises for dizziness from past POC, and added lumbar exercises  to HEP, answered pt's questions in regards to osteopenia, importance of using RW AT ALL TIMES for safety and to decr fall risk  Person educated: Patient and Spouse Education method: Explanation, Demonstration, Tactile cues, Verbal cues, and Handouts Education comprehension: returned demonstration, verbal cues required, tactile cues required, and needs further  education  HOME EXERCISE PROGRAM: Walking program with RW (see pt instructions for more details) Seated VOR x1, 10 reps   Access Code: [email or web address] URL: [email or web address] Date: 09/25/2022 Prepared by: Sherlie Ban  Exercises - Seated Left Head Turns Vestibular Habituation  - 1-2 x daily - 5 x weekly - 2 sets - 10 reps - Seated Head Nods Vestibular Habituation  - 1-2 x daily - 5 x weekly - 2 sets - 10 reps - Supine Single Knee to Chest Stretch  - 1-2 x daily - 5 x weekly - 3 sets - 30 hold - Lower Trunk Rotations  - 2 x daily - 5 x weekly - 1-2 sets - 10 reps - Sit to Stand with Armchair  - 2 x daily - 5 x weekly - 1-2 sets - 10 reps  GOALS: Goals reviewed with patient? Yes  STG = LTG due to POC length    LONG TERM GOALS: Target date: 10/07/2022   Pt will be independent with final HEP for improved strength, balance, transfers and gait.  Baseline: to be reviewed from previous POC Goal status: INITIAL  2.  Pt will decr ODI to a 20/50 or less in order to demo decr disability with low back pain.  Baseline: 24/50 Goal status: INITIAL  3.  Pt will improve 5 x STS to less than or equal to 22 seconds w/no retropulsion to demonstrate improved functional strength and transfer efficiency.   Baseline: 25.03s w/BUE support and severe retropulsion  Goal status: INITIAL  ASSESSMENT:  CLINICAL IMPRESSION: Today's skilled session focused on reviewing remainder of pt's HEP for vestibular deficits and dizziness. Pt needing max verbal, demo cues for how to proper perform seated VOR in the horizontal direction. Pt having a lot of difficulty being able to keep eyes on the X, after multiple reps pt able to better perform slowly. Educated on purpose of vestibular exercises for dizziness. Also added some low back stretches in supine that pt can perform at home to see if it will help with back pain. Educated to continue to perform walking program and remainder of HEP for home. Will  continue per POC.   OBJECTIVE IMPAIRMENTS: Abnormal gait, decreased activity tolerance, decreased balance, decreased cognition, decreased coordination, decreased endurance, decreased knowledge of condition, decreased knowledge of use of DME, decreased mobility, difficulty walking, decreased strength, decreased safety awareness, dizziness, impaired sensation, impaired vision/preception, improper body mechanics, and pain.   ACTIVITY LIMITATIONS: carrying, lifting, bending, sitting, standing, squatting, sleeping, stairs, transfers, bed mobility, reach over head, hygiene/grooming, and locomotion level  PARTICIPATION LIMITATIONS: meal prep, cleaning, laundry, medication management, interpersonal relationship, driving, shopping, community activity, and yard work  PERSONAL FACTORS: Age, Behavior pattern, Fitness, Past/current experiences, and 1-2 comorbidities: Sarcoidosis and PD  are also affecting patient's functional outcome.   REHAB POTENTIAL: Fair due to pt's sedentary lifestyle   CLINICAL DECISION MAKING: Evolving/moderate complexity  EVALUATION COMPLEXITY: Moderate  PLAN:  PT FREQUENCY: 1x/week  PT DURATION: 4 weeks (POC written for 6 weeks due to delay in scheduling)   PLANNED INTERVENTIONS: Therapeutic exercises, Therapeutic activity, Neuromuscular re-education, Balance training, Gait training, Patient/Family education, Self Care, Joint mobilization, Stair training, Vestibular training, Canalith repositioning, DME instructions, Aquatic  Therapy, Dry Needling, Manual therapy, and Re-evaluation  PLAN FOR NEXT SESSION: Review previous HEP for balance, SIT TO STANDS. walking program, SciFit/NuStep for improved activity or low back stretches??    Drake Leach, PT, DPT 09/25/2022, 3:42 PM

## 2022-10-02 ENCOUNTER — Ambulatory Visit: Payer: Medicare Other | Attending: Neurology | Admitting: Physical Therapy

## 2022-10-02 DIAGNOSIS — R293 Abnormal posture: Secondary | ICD-10-CM | POA: Diagnosis not present

## 2022-10-02 DIAGNOSIS — R2681 Unsteadiness on feet: Secondary | ICD-10-CM | POA: Diagnosis not present

## 2022-10-02 DIAGNOSIS — R2689 Other abnormalities of gait and mobility: Secondary | ICD-10-CM | POA: Insufficient documentation

## 2022-10-02 NOTE — Therapy (Signed)
OUTPATIENT PHYSICAL THERAPY NEURO TREATMENT   Patient Name: Destiny Roth MRN: [id number] DOB:1950-04-10, 72 y.o., female Today's Date: 10/02/2022   PCP: Lorenda Ishihara, MD REFERRING PROVIDER: Brion Aliment, FNP  END OF SESSION:  PT End of Session - 10/02/22 1407     Visit Number 4    Number of Visits 5    Date for PT Re-Evaluation 10/14/22    Authorization Type Medicare    PT Start Time 1405    PT Stop Time 1446    PT Time Calculation (min) 41 min    Activity Tolerance Patient tolerated treatment well    Behavior During Therapy Anxious;WFL for tasks assessed/performed             Past Medical History:  Diagnosis Date   Abdominal pain, epigastric 09/18/2009   Anosmia 11/06/2010   ANXIETY 09/23/2006   BUNION, RIGHT FOOT 09/18/2009   CARPAL TUNNEL SYNDROME, BILATERAL 09/23/2006   DEPRESSION 09/23/2006   DIVERTICULOSIS, COLON 09/23/2006   GERD 05/24/2008   GLUCOSE INTOLERANCE 05/24/2008   Hemorrhoids    Hypercalcemia    HYPERLIPIDEMIA 09/23/2006   HYPERTENSION 09/23/2006   IBS 09/23/2006   Impaired glucose tolerance 11/04/2010   OSTEOPOROSIS 05/24/2008   06/01/19-pt states osteopenia   Parkinson's disease    Restrictive lung disease 12/20/2013   Sarcoidosis 02/26/2019   Past Surgical History:  Procedure Laterality Date   ABDOMINAL EXPOSURE N/A 10/29/2021   Procedure: LATERAL EXPOSURE FOR OBLIQUE SPINE SURGERY;  Surgeon: Cephus Shelling, MD;  Location: [facility] OR;  Service: Vascular;  Laterality: N/A;   CARDIAC CATHETERIZATION     CATARACT EXTRACTION     COLONOSCOPY  2008   COLONOSCOPY W/ BIOPSIES  2008   Dr. Loreta Ave -negative random bxs   ESOPHAGOGASTRODUODENOSCOPY  2008   Dr. Jeral Fruit duodenal ulcer   FOOT SURGERY Bilateral 2015   LEFT AND RIGHT HEART CATHETERIZATION WITH CORONARY ANGIOGRAM N/A 11/09/2013   Procedure: LEFT AND RIGHT HEART CATHETERIZATION WITH CORONARY ANGIOGRAM;  Surgeon: Peter M Swaziland, MD;  Location: [facility] CATH LAB;  Service:  Cardiovascular;  Laterality: N/A;   nasal septum repair     OBLIQUE LUMBAR INTERBODY FUSION 1 LEVEL WITH PERCUTANEOUS SCREWS N/A 10/29/2021   Procedure: OBLIQUE LUMBAR INTERBODY FUSION WITH SUPPLEMENTAL PEDICLE SCREW FIXATION LUMBAR FOUR TO FIVE;  Surgeon: Venita Lick, MD;  Location: [facility] OR;  Service: Orthopedics;  Laterality: N/A;   s/p bilat CTS     s/p fibroid ablation     s/p ganglion cyst right wrist     s/p right thumb trigger finger surgury     SHOULDER SURGERY Right 2015   TUBAL LIGATION     UPPER GASTROINTESTINAL ENDOSCOPY     Patient Active Problem List   Diagnosis Date Noted   S/P lumbar fusion 10/29/2021   Small fiber neuropathy 11/20/2020   Lumbar radiculopathy 06/28/2020   Chronic neck pain 06/20/2020   Degenerative spondylolisthesis 06/20/2020   High risk medication use 04/27/2020   Panuveitis of both eyes 04/27/2020   Parkinson's disease 09/19/2019   Optic nerve edema 07/06/2019   Peripheral focal chorioretinal inflammation of both eyes 07/06/2019   Retinal edema 07/06/2019   Therapeutic drug monitoring 05/26/2019   Visual disturbance 04/27/2019   Sarcoidosis 02/26/2019   Hypercalcemia 02/04/2019   Generalized weakness 02/04/2019   Left-sided vestibular weakness 07/24/2017   Moderate episode of recurrent major depressive disorder (HCC) 05/28/2017   RBD (REM behavioral disorder) 10/16/2016   Chronic depression 10/16/2016   Fatigue 10/16/2016   Vertigo 03/12/2016  Pulmonary air trapping 02/24/2015   DOE (dyspnea on exertion) 11/18/2013   Hot flashes 11/18/2013   Memory impairment 08/04/2013   Orthostasis 02/26/2013   Tachycardia 02/24/2013   Coronary artery calcification seen on CAT scan 01/01/2013   Tendonitis, calcific, shoulder 01/01/2013   OSA (obstructive sleep apnea) 01/01/2013   PD (Parkinson's disease) (HCC) 11/26/2011   Hematochezia 11/09/2011   Colon polyps 11/09/2011   IBS (irritable bowel syndrome) 11/09/2011   Anosmia 11/06/2010    Impaired glucose tolerance 11/04/2010   Hearing loss 05/07/2010   Preventative health care 05/07/2010   BUNION, RIGHT FOOT 09/18/2009   GERD 05/24/2008   OSTEOPOROSIS 05/24/2008   Hyperlipidemia 09/23/2006   Anxiety state 09/23/2006   Depression 09/23/2006   CARPAL TUNNEL SYNDROME, BILATERAL 09/23/2006   Essential hypertension 09/23/2006   DIVERTICULOSIS, COLON 09/23/2006   Carpal tunnel syndrome, bilateral 09/23/2006    ONSET DATE: 08/08/2022  REFERRING DIAG: G20.B1 (ICD-10-CM) - Parkinson's disease with dyskinesia, without mention of fluctuations  THERAPY DIAG:  Unsteadiness on feet  Abnormal posture  Other abnormalities of gait and mobility  Rationale for Evaluation and Treatment: Rehabilitation  SUBJECTIVE:                                                                                                                                                                                             SUBJECTIVE STATEMENT: Pt reports she is shaky today. Worked out hard the other day and is now feeling "terrible". Inquiring if it is normal to feel "off" after exercising. Saw a neuro-ophthamologist and is getting new glasses.  No falls  Pt accompanied by: significant other  PERTINENT HISTORY: Sarcoidosis, Parkinson's, Lumbar fusion in 10/2021    PAIN:  Are you having pain? Yes: NPRS scale: 3/10 Pain location: Bilateral hips and tops of thighs Pain description: Achy   Feels like muscles of low back get a little frozen.   PRECAUTIONS: Fall  RED FLAGS: None   WEIGHT BEARING RESTRICTIONS: No  FALLS: Has patient fallen in last 6 months? Yes. Number of falls ~6   LIVING ENVIRONMENT: Lives with: lives with their spouse Lives in: House/apartment Stairs: Yes: Internal: sunken living room steps; on right going up, on left going up, and can reach both and External: 3 steps; on right going up Has following equipment at home: Walker - 2 wheeled and shower chair  PLOF: Requires  assistive device for independence, Needs assistance with homemaking, and Needs assistance with transfers  PATIENT GOALS: "To work on my back" "I want to be able to get more strength in my back and less pain"   OBJECTIVE:   DIAGNOSTIC FINDINGS:  None relevant on file   COGNITION: Overall cognitive status: Impaired   SENSATION: Pt reports complete numbness in BLEs and distal BUEs    POSTURE: rounded shoulders and forward head   BED MOBILITY:  Pt reports husband built her a step with a platform to get into bed. Requires assistance when she is tired   TRANSFERS: Assistive device utilized:  RW w/casters placed on front legs and wheels placed on back legs    Sit to stand: CGA and Min A Stand to sit: CGA Pt demonstrates decreased anterior lean and pulling up on RW to stand, husband holds RW to stabilize   GAIT: Gait pattern: step through pattern, decreased stride length, decreased hip/knee flexion- Right, decreased hip/knee flexion- Left, decreased trunk rotation, trunk flexed, poor foot clearance- Right, and poor foot clearance- Left Distance walked: Various clinic distances  Assistive device utilized:  RW w/casters placed on front legs and wheels placed on back legs Level of assistance: SBA Comments: Increased difficulty w/turns, heavy reliance on BUE support   TODAY'S TREATMENT:     Ther Act  Entirety of session spent addressing pt's questions regarding exercise prescription and what to expect following activity. Pt reports she is having an "off" day today and thinks it is due to her doing her HEP on Monday. Informed pt that it will take time for her body to acclimate to exercise as she has not been exercising for a prolonged period of time and reminded pt she continuously had "off" days before she was exercising. Strongly encouraged pt to work on consistency w/her HEP and start SLOWLY, as she reports she would like to go on a long walk and return to activities that she has not  performed in several years. Husband in agreement w/PT that pt needs to start slowly building up activity tolerance and educated pt on the 10 minute rule for activity and progressive overload. Pt reports today is a day she would typically lay in bed all day but is motivated to go home and ride her recumbent bike instead. Pt and husband in agreement to DC next week.   PATIENT EDUCATION: Education details: Importance of walking program at home and continuing to perform, answered pt's questions in regards to exercise prescription and typical side effects following exercise (soreness), importance of using RW AT ALL TIMES for safety and to decr fall risk, importance of starting to slowly build up tolerance to activity, breathing strategies Person educated: Patient and Spouse Education method: Medical illustrator Education comprehension: verbalized understanding and needs further education  HOME EXERCISE PROGRAM: Walking program with RW (see pt instructions for more details) Seated VOR x1, 10 reps   Access Code: [email or web address] URL: [email or web address] Date: 09/25/2022 Prepared by: Sherlie Ban  Exercises - Seated Left Head Turns Vestibular Habituation  - 1-2 x daily - 5 x weekly - 2 sets - 10 reps - Seated Head Nods Vestibular Habituation  - 1-2 x daily - 5 x weekly - 2 sets - 10 reps - Supine Single Knee to Chest Stretch  - 1-2 x daily - 5 x weekly - 3 sets - 30 hold - Lower Trunk Rotations  - 2 x daily - 5 x weekly - 1-2 sets - 10 reps - Sit to Stand with Armchair  - 2 x daily - 5 x weekly - 1-2 sets - 10 reps  GOALS: Goals reviewed with patient? Yes  STG = LTG due to POC length    LONG TERM GOALS: Target date: 10/07/2022  Pt will be independent with final HEP for improved strength, balance, transfers and gait.  Baseline: to be reviewed from previous POC Goal status: INITIAL  2.  Pt will decr ODI to a 20/50 or less in order to demo decr disability with low back  pain.  Baseline: 24/50 Goal status: INITIAL  3.  Pt will improve 5 x STS to less than or equal to 22 seconds w/no retropulsion to demonstrate improved functional strength and transfer efficiency.   Baseline: 25.03s w/BUE support and severe retropulsion  Goal status: INITIAL  ASSESSMENT:  CLINICAL IMPRESSION: Emphasis of skilled PT session on addressing all of pt's questions regarding exercise and preparing for DC next week. Pt reports she feels "off" today and thinks it is due to doing her HEP the day prior. Husband frequently reminding pt throughout session that she felt "off" a lot more prior to exercising. Encouraged pt to start slowly building up tolerance to exercise and to use the 10 minute rule to work on consistency w/movement, even on her bad days. Pt in agreement w/therapist education and verbalized readiness to DC next week.  Continue POC.   OBJECTIVE IMPAIRMENTS: Abnormal gait, decreased activity tolerance, decreased balance, decreased cognition, decreased coordination, decreased endurance, decreased knowledge of condition, decreased knowledge of use of DME, decreased mobility, difficulty walking, decreased strength, decreased safety awareness, dizziness, impaired sensation, impaired vision/preception, improper body mechanics, and pain.   ACTIVITY LIMITATIONS: carrying, lifting, bending, sitting, standing, squatting, sleeping, stairs, transfers, bed mobility, reach over head, hygiene/grooming, and locomotion level  PARTICIPATION LIMITATIONS: meal prep, cleaning, laundry, medication management, interpersonal relationship, driving, shopping, community activity, and yard work  PERSONAL FACTORS: Age, Behavior pattern, Fitness, Past/current experiences, and 1-2 comorbidities: Sarcoidosis and PD  are also affecting patient's functional outcome.   REHAB POTENTIAL: Fair due to pt's sedentary lifestyle   CLINICAL DECISION MAKING: Evolving/moderate complexity  EVALUATION COMPLEXITY:  Moderate  PLAN:  PT FREQUENCY: 1x/week  PT DURATION: 4 weeks (POC written for 6 weeks due to delay in scheduling)   PLANNED INTERVENTIONS: Therapeutic exercises, Therapeutic activity, Neuromuscular re-education, Balance training, Gait training, Patient/Family education, Self Care, Joint mobilization, Stair training, Vestibular training, Canalith repositioning, DME instructions, Aquatic Therapy, Dry Needling, Manual therapy, and Re-evaluation  PLAN FOR NEXT SESSION: Goals and DC    Acadia Thammavong E Ronnette Rump, PT, DPT 10/02/2022, 3:45 PM

## 2022-10-09 ENCOUNTER — Ambulatory Visit: Payer: Medicare Other | Admitting: Physical Therapy

## 2022-10-09 DIAGNOSIS — R2681 Unsteadiness on feet: Secondary | ICD-10-CM | POA: Diagnosis not present

## 2022-10-09 DIAGNOSIS — R293 Abnormal posture: Secondary | ICD-10-CM | POA: Diagnosis not present

## 2022-10-09 DIAGNOSIS — R2689 Other abnormalities of gait and mobility: Secondary | ICD-10-CM | POA: Diagnosis not present

## 2022-10-09 NOTE — Therapy (Signed)
OUTPATIENT PHYSICAL THERAPY NEURO TREATMENT- DISCHARGE SUMMARY   Patient Name: Destiny Roth MRN: [id number] DOB:14-Jan-1951, 72 y.o., female Today's Date: 10/09/2022   PCP: Lorenda Ishihara, MD REFERRING PROVIDER: Brion Aliment, FNP  PHYSICAL THERAPY DISCHARGE SUMMARY  Visits from Start of Care: 5  Current functional level related to goals / functional outcomes: Pt requires RW for mobility at S* level due to impaired vision, dizziness and imbalance    Remaining deficits: Moderate fall risk, decreased activity tolerance, impaired vision, dizziness, decreased functional strength and global pain    Education / Equipment: HEP   Patient agrees to discharge. Patient goals were partially met. Patient is being discharged due to maximized rehab potential.    END OF SESSION:  PT End of Session - 10/09/22 1404     Visit Number 5    Number of Visits 5    Date for PT Re-Evaluation 10/14/22    Authorization Type Medicare    PT Start Time 1401    PT Stop Time 1421   DC   PT Time Calculation (min) 20 min    Activity Tolerance Patient tolerated treatment well    Behavior During Therapy WFL for tasks assessed/performed              Past Medical History:  Diagnosis Date   Abdominal pain, epigastric 09/18/2009   Anosmia 11/06/2010   ANXIETY 09/23/2006   BUNION, RIGHT FOOT 09/18/2009   CARPAL TUNNEL SYNDROME, BILATERAL 09/23/2006   DEPRESSION 09/23/2006   DIVERTICULOSIS, COLON 09/23/2006   GERD 05/24/2008   GLUCOSE INTOLERANCE 05/24/2008   Hemorrhoids    Hypercalcemia    HYPERLIPIDEMIA 09/23/2006   HYPERTENSION 09/23/2006   IBS 09/23/2006   Impaired glucose tolerance 11/04/2010   OSTEOPOROSIS 05/24/2008   06/01/19-pt states osteopenia   Parkinson's disease    Restrictive lung disease 12/20/2013   Sarcoidosis 02/26/2019   Past Surgical History:  Procedure Laterality Date   ABDOMINAL EXPOSURE N/A 10/29/2021   Procedure: LATERAL EXPOSURE FOR OBLIQUE SPINE SURGERY;   Surgeon: Cephus Shelling, MD;  Location: [facility] OR;  Service: Vascular;  Laterality: N/A;   CARDIAC CATHETERIZATION     CATARACT EXTRACTION     COLONOSCOPY  2008   COLONOSCOPY W/ BIOPSIES  2008   Dr. Loreta Ave -negative random bxs   ESOPHAGOGASTRODUODENOSCOPY  2008   Dr. Jeral Fruit duodenal ulcer   FOOT SURGERY Bilateral 2015   LEFT AND RIGHT HEART CATHETERIZATION WITH CORONARY ANGIOGRAM N/A 11/09/2013   Procedure: LEFT AND RIGHT HEART CATHETERIZATION WITH CORONARY ANGIOGRAM;  Surgeon: Peter M Swaziland, MD;  Location: [facility] CATH LAB;  Service: Cardiovascular;  Laterality: N/A;   nasal septum repair     OBLIQUE LUMBAR INTERBODY FUSION 1 LEVEL WITH PERCUTANEOUS SCREWS N/A 10/29/2021   Procedure: OBLIQUE LUMBAR INTERBODY FUSION WITH SUPPLEMENTAL PEDICLE SCREW FIXATION LUMBAR FOUR TO FIVE;  Surgeon: Venita Lick, MD;  Location: [facility] OR;  Service: Orthopedics;  Laterality: N/A;   s/p bilat CTS     s/p fibroid ablation     s/p ganglion cyst right wrist     s/p right thumb trigger finger surgury     SHOULDER SURGERY Right 2015   TUBAL LIGATION     UPPER GASTROINTESTINAL ENDOSCOPY     Patient Active Problem List   Diagnosis Date Noted   S/P lumbar fusion 10/29/2021   Small fiber neuropathy 11/20/2020   Lumbar radiculopathy 06/28/2020   Chronic neck pain 06/20/2020   Degenerative spondylolisthesis 06/20/2020   High risk medication use 04/27/2020   Panuveitis  of both eyes 04/27/2020   Parkinson's disease 09/19/2019   Optic nerve edema 07/06/2019   Peripheral focal chorioretinal inflammation of both eyes 07/06/2019   Retinal edema 07/06/2019   Therapeutic drug monitoring 05/26/2019   Visual disturbance 04/27/2019   Sarcoidosis 02/26/2019   Hypercalcemia 02/04/2019   Generalized weakness 02/04/2019   Left-sided vestibular weakness 07/24/2017   Moderate episode of recurrent major depressive disorder (HCC) 05/28/2017   RBD (REM behavioral disorder) 10/16/2016   Chronic depression 10/16/2016    Fatigue 10/16/2016   Vertigo 03/12/2016   Pulmonary air trapping 02/24/2015   DOE (dyspnea on exertion) 11/18/2013   Hot flashes 11/18/2013   Memory impairment 08/04/2013   Orthostasis 02/26/2013   Tachycardia 02/24/2013   Coronary artery calcification seen on CAT scan 01/01/2013   Tendonitis, calcific, shoulder 01/01/2013   OSA (obstructive sleep apnea) 01/01/2013   PD (Parkinson's disease) (HCC) 11/26/2011   Hematochezia 11/09/2011   Colon polyps 11/09/2011   IBS (irritable bowel syndrome) 11/09/2011   Anosmia 11/06/2010   Impaired glucose tolerance 11/04/2010   Hearing loss 05/07/2010   Preventative health care 05/07/2010   BUNION, RIGHT FOOT 09/18/2009   GERD 05/24/2008   OSTEOPOROSIS 05/24/2008   Hyperlipidemia 09/23/2006   Anxiety state 09/23/2006   Depression 09/23/2006   CARPAL TUNNEL SYNDROME, BILATERAL 09/23/2006   Essential hypertension 09/23/2006   DIVERTICULOSIS, COLON 09/23/2006   Carpal tunnel syndrome, bilateral 09/23/2006    ONSET DATE: 08/08/2022  REFERRING DIAG: G20.B1 (ICD-10-CM) - Parkinson's disease with dyskinesia, without mention of fluctuations  THERAPY DIAG:  Unsteadiness on feet  Other abnormalities of gait and mobility  Abnormal posture  Rationale for Evaluation and Treatment: Rehabilitation  SUBJECTIVE:                                                                                                                                                                                             SUBJECTIVE STATEMENT: Pt reports doing well. Did ride the bike some last week but "not as much as I should have". No falls.   Pt accompanied by: significant other  PERTINENT HISTORY: Sarcoidosis, Parkinson's, Lumbar fusion in 10/2021    PAIN:  Are you having pain? Yes: NPRS scale: 4/10 Pain location: Bilateral feet Pain description: Achy   Feels like muscles of low back get a little frozen.   PRECAUTIONS: Fall  RED FLAGS: None   WEIGHT  BEARING RESTRICTIONS: No  FALLS: Has patient fallen in last 6 months? Yes. Number of falls ~6   LIVING ENVIRONMENT: Lives with: lives with their spouse Lives in: House/apartment Stairs: Yes: Internal: sunken living room steps; on right going up, on  left going up, and can reach both and External: 3 steps; on right going up Has following equipment at home: Walker - 2 wheeled and shower chair  PLOF: Requires assistive device for independence, Needs assistance with homemaking, and Needs assistance with transfers  PATIENT GOALS: "To work on my back" "I want to be able to get more strength in my back and less pain"   OBJECTIVE:   DIAGNOSTIC FINDINGS: None relevant on file   COGNITION: Overall cognitive status: Impaired   SENSATION: Pt reports complete numbness in BLEs and distal BUEs    POSTURE: rounded shoulders and forward head   BED MOBILITY:  Pt reports husband built her a step with a platform to get into bed. Requires assistance when she is tired   TRANSFERS: Assistive device utilized:  RW w/casters placed on front legs and wheels placed on back legs    Sit to stand: CGA and Min A Stand to sit: CGA Pt continues to exhibit retropulsion w/sit to stands when standing to RW but does well w/verbal cues to shift anteriorly   GAIT: Gait pattern: step through pattern, decreased stride length, decreased hip/knee flexion- Right, decreased hip/knee flexion- Left, decreased trunk rotation, trunk flexed, poor foot clearance- Right, and poor foot clearance- Left Distance walked: Various clinic distances  Assistive device utilized:  RW w/casters placed on front legs and wheels placed on back legs Level of assistance: SBA Comments: Increased difficulty w/turns, minor retropulsion     TODAY'S TREATMENT:     Ther Act  LTG Assessment  Modified Oswestry: 28/50  [facility] PT Assessment - 10/09/22 1418       Transfers   Five time sit to stand comments  25.54s   No UE support, no  retropulsion noted             PATIENT EDUCATION: Education details: Goal outcomes, importance of continuing HEP and being consistent w/exercise  Person educated: Patient and Spouse Education method: Explanation Education comprehension: verbalized understanding  HOME EXERCISE PROGRAM: Walking program with RW (see pt instructions for more details) Seated VOR x1, 10 reps   Access Code: [email or web address] URL: [email or web address] Date: 09/25/2022 Prepared by: Sherlie Ban  Exercises - Seated Left Head Turns Vestibular Habituation  - 1-2 x daily - 5 x weekly - 2 sets - 10 reps - Seated Head Nods Vestibular Habituation  - 1-2 x daily - 5 x weekly - 2 sets - 10 reps - Supine Single Knee to Chest Stretch  - 1-2 x daily - 5 x weekly - 3 sets - 30 hold - Lower Trunk Rotations  - 2 x daily - 5 x weekly - 1-2 sets - 10 reps - Sit to Stand with Armchair  - 2 x daily - 5 x weekly - 1-2 sets - 10 reps  GOALS: Goals reviewed with patient? Yes  STG = LTG due to POC length    LONG TERM GOALS: Target date: 10/07/2022   Pt will be independent with final HEP for improved strength, balance, transfers and gait.  Baseline: to be reviewed from previous POC Goal status: MET  2.  Pt will decr ODI to a 20/50 or less in order to demo decr disability with low back pain.  Baseline: 24/50; 28/50 Goal status: NOT MET  3.  Pt will improve 5 x STS to less than or equal to 22 seconds w/no retropulsion to demonstrate improved functional strength and transfer efficiency.   Baseline: 25.03s w/BUE support and severe retropulsion; 25.54s w/no UE support and  no retropulsion  Goal status: PARTIALLY MET  ASSESSMENT:  CLINICAL IMPRESSION: Emphasis of skilled PT session on LTG assessment and DC from PT. Pt has met 1 of 3 and partially met 1 of 3 LTGs. Pt has been more consistent w/her HEP this week and verbalized intention to continue her HEP post-DC. Pt did improve her form on 5x STS but did not  meet her time goal, so consider her goal as partially met. Pt scored higher on modified Oswestry this date compared to eval despite reporting improvement in pain levels. Pt continues to be limited by self-limiting behavior, retropulsion, dizziness and global deconditioning and understands that she must work on her HEP at home in order to make physical gains. Pt and husband verbalized good understanding of HEP and importance of continuing exercise at home and are in agreement to DC this date.    OBJECTIVE IMPAIRMENTS: Abnormal gait, decreased activity tolerance, decreased balance, decreased cognition, decreased coordination, decreased endurance, decreased knowledge of condition, decreased knowledge of use of DME, decreased mobility, difficulty walking, decreased strength, decreased safety awareness, dizziness, impaired sensation, impaired vision/preception, improper body mechanics, and pain.   ACTIVITY LIMITATIONS: carrying, lifting, bending, sitting, standing, squatting, sleeping, stairs, transfers, bed mobility, reach over head, hygiene/grooming, and locomotion level  PARTICIPATION LIMITATIONS: meal prep, cleaning, laundry, medication management, interpersonal relationship, driving, shopping, community activity, and yard work  PERSONAL FACTORS: Age, Behavior pattern, Fitness, Past/current experiences, and 1-2 comorbidities: Sarcoidosis and PD  are also affecting patient's functional outcome.   REHAB POTENTIAL: Fair due to pt's sedentary lifestyle   CLINICAL DECISION MAKING: Evolving/moderate complexity  EVALUATION COMPLEXITY: Moderate  PLAN:  PT FREQUENCY: 1x/week  PT DURATION: 4 weeks (POC written for 6 weeks due to delay in scheduling)   PLANNED INTERVENTIONS: Therapeutic exercises, Therapeutic activity, Neuromuscular re-education, Balance training, Gait training, Patient/Family education, Self Care, Joint mobilization, Stair training, Vestibular training, Canalith repositioning, DME  instructions, Aquatic Therapy, Dry Needling, Manual therapy, and Re-evaluation    Pearlie Nies E Sherisse Fullilove, PT, DPT 10/09/2022, 2:21 PM

## 2022-10-22 DIAGNOSIS — D869 Sarcoidosis, unspecified: Secondary | ICD-10-CM | POA: Diagnosis not present

## 2022-10-22 DIAGNOSIS — Z79899 Other long term (current) drug therapy: Secondary | ICD-10-CM | POA: Diagnosis not present

## 2022-10-23 LAB — COMPREHENSIVE METABOLIC PANEL
ALT: 43 IU/L — ABNORMAL HIGH (ref 0–32)
AST: 75 IU/L — ABNORMAL HIGH (ref 0–40)
Albumin: 4.3 g/dL (ref 3.8–4.8)
Alkaline Phosphatase: 82 IU/L (ref 44–121)
BUN/Creatinine Ratio: 17 (ref 12–28)
BUN: 28 mg/dL — ABNORMAL HIGH (ref 8–27)
Bilirubin Total: 0.4 mg/dL (ref 0.0–1.2)
CO2: 20 mmol/L (ref 20–29)
Calcium: 10.4 mg/dL — ABNORMAL HIGH (ref 8.7–10.3)
Chloride: 103 mmol/L (ref 96–106)
Creatinine, Ser: 1.65 mg/dL — ABNORMAL HIGH (ref 0.57–1.00)
Globulin, Total: 3.1 g/dL (ref 1.5–4.5)
Glucose: 102 mg/dL — ABNORMAL HIGH (ref 70–99)
Potassium: 5 mmol/L (ref 3.5–5.2)
Sodium: 142 mmol/L (ref 134–144)
Total Protein: 7.4 g/dL (ref 6.0–8.5)
eGFR: 32 mL/min/{1.73_m2} — ABNORMAL LOW (ref >59)

## 2022-10-24 ENCOUNTER — Encounter (HOSPITAL_BASED_OUTPATIENT_CLINIC_OR_DEPARTMENT_OTHER): Payer: Self-pay | Admitting: Pulmonary Disease

## 2022-10-24 ENCOUNTER — Ambulatory Visit (HOSPITAL_BASED_OUTPATIENT_CLINIC_OR_DEPARTMENT_OTHER): Payer: Medicare Other | Admitting: Pulmonary Disease

## 2022-10-24 VITALS — BP 128/72 | HR 81 | Ht <= 58 in | Wt 136.0 lb

## 2022-10-24 DIAGNOSIS — D869 Sarcoidosis, unspecified: Secondary | ICD-10-CM | POA: Diagnosis not present

## 2022-10-24 DIAGNOSIS — R269 Unspecified abnormalities of gait and mobility: Secondary | ICD-10-CM | POA: Insufficient documentation

## 2022-10-24 DIAGNOSIS — E785 Hyperlipidemia, unspecified: Secondary | ICD-10-CM | POA: Insufficient documentation

## 2022-10-24 DIAGNOSIS — R921 Mammographic calcification found on diagnostic imaging of breast: Secondary | ICD-10-CM | POA: Insufficient documentation

## 2022-10-24 DIAGNOSIS — F3341 Major depressive disorder, recurrent, in partial remission: Secondary | ICD-10-CM | POA: Insufficient documentation

## 2022-10-24 DIAGNOSIS — N1832 Chronic kidney disease, stage 3b: Secondary | ICD-10-CM | POA: Insufficient documentation

## 2022-10-24 DIAGNOSIS — R2681 Unsteadiness on feet: Secondary | ICD-10-CM | POA: Insufficient documentation

## 2022-10-24 DIAGNOSIS — M1991 Primary osteoarthritis, unspecified site: Secondary | ICD-10-CM | POA: Insufficient documentation

## 2022-10-24 DIAGNOSIS — J309 Allergic rhinitis, unspecified: Secondary | ICD-10-CM | POA: Insufficient documentation

## 2022-10-24 DIAGNOSIS — G4700 Insomnia, unspecified: Secondary | ICD-10-CM | POA: Insufficient documentation

## 2022-10-24 DIAGNOSIS — Z92241 Personal history of systemic steroid therapy: Secondary | ICD-10-CM | POA: Insufficient documentation

## 2022-10-24 DIAGNOSIS — R202 Paresthesia of skin: Secondary | ICD-10-CM | POA: Insufficient documentation

## 2022-10-24 MED ORDER — ALBUTEROL SULFATE HFA 108 (90 BASE) MCG/ACT IN AERS: 2.0000 | INHALATION_SPRAY | Freq: Four times a day (QID) | RESPIRATORY_TRACT | 2 refills | Status: AC | PRN

## 2022-10-24 NOTE — Progress Notes (Signed)
Subjective:   PATIENT ID: Foster Simpson: female DOB: [birth year], MRN: [id number]   HPI  Chief Complaint  Patient presents with   Sarcoidosis    Reason for Visit: Follow-up  Mrs. Mackinzie Coldwell is a 72 year old with Parkinson's disease, HTN, HLD and restrictive lung disease who presents for follow-up for sarcoid management.  Synopsis:  Initially seen as an inpatient Pulmonary consult by me on 02/23/19 for hypercalcemia. Bone biopsy was performed and demonstrated granulomas disease without necrosis. She was started on steroids and discharged with Endocrine and Pulmonary follow-up. Of note, she was previously seen in our Pulmonary clinic in 2018 for dyspnea and mild sleep apnea. Her prior PFTs demonstrate mild restrictive defect with normal DLCO which patient stated was attributed to her Parkinson's disease.  2021 - She is currently on tacrolimus 1 mg BID, methotrexate and prednisone. She continues to have fatigue and visual blurriness. Her dyspnea also remains unchanged. Worsens with exertion. Tacrolimus was discontinued by ophthalmology due tremors. Started on CellCept in November 2021.  2022 - Weaned off methotrexate. She still continues to have issues with fatigue, tremors and word finding after switching to Cellcept. When seen by her neurologist, she reports that her Parkinson's is stable. She was referred to Dr. Yevonne Pax to evaluate for neuro involvement of her sarcoid. Ophthalmology appt 03/14/20 ocular exam shows stable inflammation. Her ophthalmology note from 05/16/20 she has requested stopping taking immunosuppressants to see if these are the cause of her symptoms of fatigue, memory and tremor issues. Her fatigue and tremors have improved off the cellcept however it remains persistent. Unclear if her memory is better off cellcept. Started on Humira on 07/11/20. Started on Amantadine for progressive dyskinesia related to her Parkinsons. She is unsure if the inhaler helps  but she is compliant with Incruse daily. She was seen by Neurology and gabapentin increased for newly diagnosed small fiber neuropathy. She reports that her vision is still distorted and occurs in the day but worsens at night.  2023 - Ophthalmology noted by Dr. Sherryll Burger on 01/16/21 reviewed with no signs of active inflammation on eye exam. She reports that she is struggling with her lower extremity neuropathy which worsens her balance. She has been compliant with methotrexate and Humira. She underwent spine surgery (C and L) in October and held her immunosuppression temporarily. She is having some hair loss that started with methotrexate use. She reports her vision is unchanged. Having balance issues but thought to be due to Parkinsons. 2024 - Parkinson's has progressed and needing more support with dizziness. Discontinued Incruse due to cankar sores. Weaned off methotrexate in March due to concern for side effects, however unable to differentiate between chronic med issues vs side effects of med.  07/24/22 Compliant with Humira. Reports no shortness of breath but does have minor productive cough that improves with expelling sputum once a day. Husband reports she is fatigue. She is not active at baseline. Feels dizzy with standing, not sure if related. Was previously in physical therapy but not currently. Has been trying to cut back on sedating meds including gabapentin and requip as tolerated.   10/24/22 Since our last visit she is less active and working with physical therapy. Feels her Parkinson's has progressed. When standing she has back pain, trembling and head fullness. She has visual hallucinations that is slightly worse. Remains fatigued that is unchanged on or off immunosuppressants. Does complain of more shortness of breath. No cough. Planning to go to beach next week. Currently  only on Humira. Husband present and provides support during visit.  Social History: Never smoker Previously Merchandiser, retail for  admission at [facility]   Past Medical History:  Diagnosis Date   Abdominal pain, epigastric 09/18/2009   Anosmia 11/06/2010   ANXIETY 09/23/2006   BUNION, RIGHT FOOT 09/18/2009   CARPAL TUNNEL SYNDROME, BILATERAL 09/23/2006   DEPRESSION 09/23/2006   DIVERTICULOSIS, COLON 09/23/2006   GERD 05/24/2008   GLUCOSE INTOLERANCE 05/24/2008   Hemorrhoids    Hypercalcemia    HYPERLIPIDEMIA 09/23/2006   HYPERTENSION 09/23/2006   IBS 09/23/2006   Impaired glucose tolerance 11/04/2010   OSTEOPOROSIS 05/24/2008   06/01/19-pt states osteopenia   Parkinson's disease    Restrictive lung disease 12/20/2013   Sarcoidosis 02/26/2019      Outpatient Medications Prior to Visit  Medication Sig Dispense Refill   acetaminophen (TYLENOL) 500 MG tablet Take 500 mg by mouth every 6 (six) hours as needed for mild pain.     Adalimumab (HUMIRA, 2 PEN,) 40 MG/0.4ML PNKT Inject 40 mg into the skin every 14 (fourteen) days. 1 kit - 2 pens 6 each 3   Amantadine HCl 100 MG tablet Take 100 mg by mouth in the morning, at noon, and at bedtime.     aspirin EC 81 MG tablet Take 81 mg by mouth daily.     Biotin 1 MG CAPS Take 1 mg by mouth daily at 2 PM. 30 capsule 5   Carbidopa-Levodopa ER (SINEMET CR) 25-100 MG tablet controlled release Take 1 tablet by mouth 2 (two) times daily.     gabapentin (NEURONTIN) 300 MG capsule Take 300 mg by mouth 3 (three) times daily.     memantine (NAMENDA) 5 MG tablet Take 10 mg by mouth 2 (two) times daily. Patient has not started it yet as she wants to know more about it     RESTASIS MULTIDOSE 0.05 % ophthalmic emulsion 1 drop 2 (two) times daily.     rOPINIRole (REQUIP) 1 MG tablet Take 1 tablet (1 mg total) by mouth 3 (three) times daily. (Patient taking differently: Take 1 mg by mouth in the morning and at bedtime.) 90 tablet 0   rosuvastatin (CRESTOR) 10 MG tablet Take 10 mg by mouth daily.     senna (SENOKOT) 8.6 MG TABS tablet Take 1 tablet (8.6 mg total) by mouth 2 (two) times daily. 30 tablet 0    sertraline (ZOLOFT) 100 MG tablet Take 1 tablet by mouth daily.     carbidopa-levodopa (SINEMET IR) 25-100 MG tablet Take 0.5-1 tablets by mouth See admin instructions. Take 1 tablet by mouth in the morning and then take half tablet by mouth at noon, and 4 pm and at bedtime per patient     gabapentin (NEURONTIN) 100 MG capsule Take 200 mg by mouth 2 (two) times daily.     rosuvastatin (CRESTOR) 40 MG tablet Take 1 tablet (40 mg total) by mouth daily. 90 tablet 3   No facility-administered medications prior to visit.    Review of Systems  Constitutional:  Positive for malaise/fatigue. Negative for chills, diaphoresis, fever and weight loss.  HENT:  Negative for congestion.   Respiratory:  Positive for shortness of breath. Negative for cough, hemoptysis, sputum production and wheezing.   Cardiovascular:  Negative for chest pain, palpitations and leg swelling.  Neurological:  Positive for dizziness and weakness.  Psychiatric/Behavioral:  Positive for hallucinations.    Objective:   Vitals:   10/24/22 1424  BP: 128/72  Pulse: 81  SpO2: 96%  Weight: 136 lb (61.7 kg)  Height: 4' 9.5" (1.461 m)    SpO2: 96 % Physical Exam: General: Well-appearing, no acute distress HENT: [place], AT Eyes: EOMI, no scleral icterus Respiratory: Clear to auscultation bilaterally.  No crackles, wheezing or rales Cardiovascular: RRR, -M/R/G, no JVD Extremities:-Edema,-tenderness Neuro: AAO x4, CNII-XII grossly intact Psych: Normal mood, normal affect  Data Reviewed:  Imaging: CXR 02/23/19 - interval resolutions of bilateral pleural effusions CT Chest 02/09/19 - Bilateral pleural effusions. Mild ground glass opacifications bilaterally. No enlarged mediastinal or hilar adenopathy. PET Myocardial 01/07/20 - No evidence of increased uptake PET/CT 01/07/20 1. Bilateral ground-glass pulmonary opacities demonstrate mild-moderate FDG avidity. These findings are nonspecific and may be infectious or inflammatory.2.  Mild FDG activity of non-enlarged bilateral hilar lymph nodes is also nonspecific.  This could be reactive in the setting of the pulmonary findings, but activity related to sarcoid cannot be excluded.  CXR 03/30/20 - No infiltrate, effusion or edema.  PET Myocardial 03/09/21 - Persistent FDG avid bilateral hilar lymphadenopathy. New FDG avid prevascular lymph node. Increased bilateral FDG avid groundglass pulmonary opacities. These remain nonspecific and are again favored to be infectious/ inflammatory etiology. No evidence of active cardiac sarcoidosis.  CT Cardiac 07/24/22 - Visualized lung parenchyma with no pulmonary nodules, masses, infiltrate, effusion or pneumothorax.  PFT: 12/17/13 FVC 2.26 (86%) FEV1 1.88 (93%) Ratio 78  TLC 70% DLCO 75% Interpretation: Mild restrictive defect with mild reduction in gas exchange (uncorrected DLCO)  06/22/19 FVC 2.07 (95%) FEV1 1.75 (107%) Ratio 80  TLC 91% DLCO 63% Interpretation: No obstructive or restrictive defect. Mild reduction in gas exchange   06/10/20 FVC 2.11 (99%) FEV1 1.8 (113%) Ratio 82  TLC 132% RV 162% RV/TLC 130% DLCO 94% Interpretation: Normal spirometry and gas exchange. Compared to prior PFTs, patient has had increased lung volumes suggestive of air trapping. No significant bronchodilator response however does not preclude benefit. Clinically correlate  09/12/20 FVC 2.17 (102%) FEV1 1.87 (117%) Ratio 86  TLC 105% DLCO 88% Interpretation: Normal PFTs. Air trapping has resolved.  04/24/22 FVC 2.06 (100%) FEV1 1.77 (116%) Ratio 86  TLC 99% DLCO 90% Interpretation: Normal PFTs   Echocardiogram: 04/22/19 - EF 60-65%. Grade I DD. No WMA or valvular abnormalities.  CBC    Latest Ref Rng & Units 04/16/2022    4:52 PM 02/04/2022    1:38 PM 10/31/2021    9:18 PM  CBC  WBC 4.0 - 10.5 K/uL 5.9  7.0  7.8   Hemoglobin 12.0 - 15.0 g/dL 91.4  78.2  95.6   Hematocrit 36.0 - 46.0 % 42.3  41.1  35.2   Platelets 150.0 - 400.0 K/uL 307.0  340.0  221    No new CBC     Latest Ref Rng & Units 10/22/2022    4:39 PM 07/17/2022    2:31 PM 04/16/2022    4:52 PM  BMP  Glucose 70 - 99 mg/dL 213  90  086   BUN 8 - 27 mg/dL 28  31  34   Creatinine 0.57 - 1.00 mg/dL 5.78  4.69  6.29   BUN/Creat Ratio 12 - 28 17  24     Sodium 134 - 144 mmol/L 142  139  139   Potassium 3.5 - 5.2 mmol/L 5.0  4.5  4.0   Chloride 96 - 106 mmol/L 103  101  104   CO2 20 - 29 mmol/L 20  28  26    Calcium 8.7 - 10.3 mg/dL 52.8  41.3  10.0   Kidney function worsened and calcium is mildly elevated     Latest Ref Rng & Units 10/22/2022    4:39 PM 07/17/2022    2:31 PM 04/16/2022    4:52 PM  Hepatic Function  Total Protein 6.0 - 8.5 g/dL 7.4  7.6  7.6   Albumin 3.8 - 4.8 g/dL 4.3   4.1   AST 0 - 40 IU/L 75  24  23   ALT 0 - 32 IU/L 43  16  10   Alk Phosphatase 44 - 121 IU/L 82   86   Total Bilirubin 0.0 - 1.2 mg/dL 0.4  0.5  0.3   Liver enzymes noted to be increased     Latest Ref Rng & Units 07/17/2022    2:31 PM  Quantiferon TB Gold  Quantiferon TB Gold Plus NEGATIVE NEGATIVE     Assessment & Plan:   Discussion: 72 year old female with sarcoid, Parkinson's disease who presents for sarcoid flare follow-up. Weaned off methotrexate March 2024 and only on Humira. Labs with persistent mild hypercalcemia and increased Cr. LFTs also noted to be increased on recent labs. We had previously discussed low threshold to restart immunosuppression however she is concerned about the side effects of the medication compounding with her Parkinson's symptoms. We discussed risks and benefits of holding on therapy and after addressing questions and concerns she has elected to await repeat labs for persistent/worsening abnormal findings. If we do decide to restart, would consider transitioning off of Humira and starting Infliximab. If she does not want to pursue this, we could consider retrialing a low dose of methotrexate or steroids again.  Sarcoid with pulm, ocular, renal, bone and  small fiber neuropathy involvement --Dx 02/26/19 vis bone biopsy at [facility] --Stable PFTs. Last completed 04/24/22 with normal PFTs --Echo reviewed from 03/2019 with grade I DD. EKG 11/01/21 overall normal. No prolonged PR interval --Sarcoid PET 12/2019 - negative for cardiac involvement. Suggests pulmonary involvement --Sarcoid PET 03/09/21 - active pulmonary involvement. Neg cardiac involvement.   High risk medication management --Transitioned from Tacrolimus to Cellcept 11/2019 --Completed prednisone taper 03/2020 --Started methotrexate 04/2020 --Started cellcept 07/2020. Methotrexate concurrently weaned d/t intolerance --Prednisone taper started with cellcept discontinuation d/t intolerance 04/2020 --Immunosuppressant holiday  --Started Humira on 07/11/20 for persistent ocular symptoms --S/p prednisone taper from Dec 2022 to Jan 2023 --Readded methotrexate 02/2021-03/2022 --Off methotrexate --Continue Humira. Eye exam with no inflammation on 04/30/22. Previously not quiescent on 11/2021 visit.  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 No active inflammation on exam --Immunosuppressants as above --Reviewed Neuro notes 07/30/22 - visual distortions may be related to Parkinson's or medication induced. 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 Glenna Fellows. Will CC notes --Will need to resume monthly labs if restarted on  Cellcept --Repeat labs in 2 months  Hypercalcemia secondary to sarcoid - Remains mildly elevated --Serial monitoring --Management as above --Follow-up on next labs. May be due to discontinuing methotrexate --May need to consider Infliximab  Small fiber neuropathy secondary to sarcoid  Parkinson's disease  --MRI Brain 03/2019 neg for neurosarcoid --Spine MRI 02/2020 neg for spinal sarcoid  --Followed by [facility] Neurology: Dr. Raquel Sarna for Parkinson's  --Followed [facility] Dr[facility] Mung[facility]. On gabapentin --Continue Namenda --On Carbidopa/levodopa and amantadine  Hair loss/alopecia --CONTINUE biotin 1 mg daily  Health Maintenance Immunization History  Administered Date(s) Administered   Influenza Split 12/06/2019   Influenza, High Dose Seasonal PF 11/30/2015, 11/16/2016, 10/29/2017, 10/30/2018, 12/09/2018, 10/31/2020   Influenza, Seasonal, Injecte, Preservative Fre 10/20/2012   Influenza,inj,Quad PF,6+ Mos 11/18/2013   Influenza-Unspecified 11/18/2013, 11/30/2014, 10/29/2017, 10/29/2018   Moderna Sars-Covid-2 Vaccination 03/12/2019, 04/09/2019, 12/06/2019, 05/18/2020   Pfizer Covid-19 Vaccine Bivalent Booster 35yrs & up 06/05/2021   Pneumococcal Conjugate-13 01/01/2013, 01/05/2014   Pneumococcal Polysaccharide-23 01/01/2013, 01/05/2014, 02/25/2020   Tdap 11/06/2010, 05/01/2021   Zoster Recombinant(Shingrix) 01/10/2018, 03/31/2018   Zoster, Live 11/06/2010, 01/15/2018, 03/20/2018   CT Lung Screen - not qualified  Orders Placed This Encounter  Procedures   CBC With Differential    Standing Status:   Future    Standing Expiration Date:   10/24/2023   Comp Met (CMET)    Standing Status:   Future    Standing Expiration Date:   10/24/2023   Meds ordered this encounter  Medications   albuterol (VENTOLIN HFA) 108 (90 Base) MCG/ACT inhaler    Sig: Inhale 2 puffs into the lungs every 6 (six) hours as needed for wheezing or shortness of breath.    Dispense:  8 g     Refill:  2   Return for for 30 min slot for the last week of November. Ok to [employer] if needed..   I have spent a total time of 45-minutes on the day of the appointment including chart review, data review, collecting history, coordinating care and discussing medical diagnosis and plan with the patient/family. Past medical history, allergies, medications were reviewed. Pertinent imaging, labs and tests included in this note have been reviewed and interpreted independently by me.  Josefa Syracuse Mechele Collin, MD [facility] 10/24/2022 3:05 PM  Office Number [phone]

## 2022-10-24 NOTE — Patient Instructions (Addendum)
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 Glenna Fellows. Will CC notes --Will need to resume monthly labs if restarted on Cellcept --Repeat labs in 2 months --May need to consider Infliximab  Shortness of breath secondary to sarcoid, deconditioning - worsening --Normal PFTs on 04/24/22 --Discontinued Incruse due to canker sores --START Albuterol TWO puffs as needed for shortness of breath, chest tightness

## 2022-11-05 ENCOUNTER — Ambulatory Visit: Payer: Medicare Other | Attending: Internal Medicine

## 2022-11-05 DIAGNOSIS — G479 Sleep disorder, unspecified: Secondary | ICD-10-CM | POA: Diagnosis not present

## 2022-11-05 DIAGNOSIS — I10 Essential (primary) hypertension: Secondary | ICD-10-CM | POA: Diagnosis not present

## 2022-11-05 DIAGNOSIS — G20A1 Parkinson's disease without dyskinesia, without mention of fluctuations: Secondary | ICD-10-CM | POA: Diagnosis not present

## 2022-11-05 DIAGNOSIS — E785 Hyperlipidemia, unspecified: Secondary | ICD-10-CM

## 2022-11-05 DIAGNOSIS — I2510 Atherosclerotic heart disease of native coronary artery without angina pectoris: Secondary | ICD-10-CM | POA: Diagnosis not present

## 2022-11-05 DIAGNOSIS — R350 Frequency of micturition: Secondary | ICD-10-CM | POA: Diagnosis not present

## 2022-11-05 DIAGNOSIS — H539 Unspecified visual disturbance: Secondary | ICD-10-CM | POA: Diagnosis not present

## 2022-11-06 DIAGNOSIS — R350 Frequency of micturition: Secondary | ICD-10-CM | POA: Diagnosis not present

## 2022-11-06 LAB — HEPATIC FUNCTION PANEL
ALT: 48 [IU]/L — ABNORMAL HIGH (ref 0–32)
AST: 49 [IU]/L — ABNORMAL HIGH (ref 0–40)
Albumin: 4.5 g/dL (ref 3.8–4.8)
Alkaline Phosphatase: 86 [IU]/L (ref 44–121)
Bilirubin Total: 0.4 mg/dL (ref 0.0–1.2)
Bilirubin, Direct: 0.14 mg/dL (ref 0.00–0.40)
Total Protein: 7.4 g/dL (ref 6.0–8.5)

## 2022-11-06 LAB — NMR, LIPOPROFILE
Cholesterol, Total: 148 mg/dL (ref 100–199)
HDL Particle Number: 44.9 umol/L (ref >=30.5)
HDL-C: 68 mg/dL (ref >39)
LDL Particle Number: 667 nmol/L (ref <1000)
LDL Size: 20.4 nmol — ABNORMAL LOW (ref >20.5)
LDL-C (NIH Calc): 54 mg/dL (ref 0–99)
LP-IR Score: 56 — ABNORMAL HIGH (ref <=45)
Small LDL Particle Number: 449 nmol/L (ref <=527)
Triglycerides: 159 mg/dL — ABNORMAL HIGH (ref 0–149)

## 2022-11-11 ENCOUNTER — Telehealth: Payer: Self-pay

## 2022-11-11 ENCOUNTER — Other Ambulatory Visit: Payer: Self-pay

## 2022-11-11 DIAGNOSIS — Z79899 Other long term (current) drug therapy: Secondary | ICD-10-CM

## 2022-11-11 DIAGNOSIS — I2510 Atherosclerotic heart disease of native coronary artery without angina pectoris: Secondary | ICD-10-CM

## 2022-11-11 DIAGNOSIS — E785 Hyperlipidemia, unspecified: Secondary | ICD-10-CM

## 2022-11-11 MED ORDER — ROSUVASTATIN CALCIUM 20 MG PO TABS: 20.0000 mg | ORAL_TABLET | Freq: Every day | ORAL | 3 refills | Status: DC

## 2022-11-11 NOTE — Telephone Encounter (Signed)
Pt given her lab results and even though her med list says she is on Rosuvastatin 10 mg a day... she has been on 40 mg a day and will decrease it to 20 mg a day....labs 02/03/23.

## 2022-11-12 DIAGNOSIS — D869 Sarcoidosis, unspecified: Secondary | ICD-10-CM | POA: Diagnosis not present

## 2022-11-12 DIAGNOSIS — H3581 Retinal edema: Secondary | ICD-10-CM | POA: Diagnosis not present

## 2022-11-12 DIAGNOSIS — H30033 Focal chorioretinal inflammation, peripheral, bilateral: Secondary | ICD-10-CM | POA: Diagnosis not present

## 2022-11-12 DIAGNOSIS — H4710 Unspecified papilledema: Secondary | ICD-10-CM | POA: Diagnosis not present

## 2022-11-12 DIAGNOSIS — H44113 Panuveitis, bilateral: Secondary | ICD-10-CM | POA: Diagnosis not present

## 2022-11-12 DIAGNOSIS — Z79899 Other long term (current) drug therapy: Secondary | ICD-10-CM | POA: Diagnosis not present

## 2022-11-14 DIAGNOSIS — Z23 Encounter for immunization: Secondary | ICD-10-CM | POA: Diagnosis not present

## 2022-11-15 DIAGNOSIS — Z4889 Encounter for other specified surgical aftercare: Secondary | ICD-10-CM | POA: Diagnosis not present

## 2022-11-21 DIAGNOSIS — R0683 Snoring: Secondary | ICD-10-CM | POA: Diagnosis not present

## 2022-11-21 DIAGNOSIS — G4752 REM sleep behavior disorder: Secondary | ICD-10-CM | POA: Diagnosis not present

## 2023-01-01 ENCOUNTER — Encounter: Payer: Self-pay | Admitting: Internal Medicine

## 2023-01-01 DIAGNOSIS — E785 Hyperlipidemia, unspecified: Secondary | ICD-10-CM

## 2023-01-01 DIAGNOSIS — I2510 Atherosclerotic heart disease of native coronary artery without angina pectoris: Secondary | ICD-10-CM

## 2023-01-01 DIAGNOSIS — Z79899 Other long term (current) drug therapy: Secondary | ICD-10-CM

## 2023-01-02 ENCOUNTER — Encounter (HOSPITAL_BASED_OUTPATIENT_CLINIC_OR_DEPARTMENT_OTHER): Payer: Self-pay | Admitting: Pulmonary Disease

## 2023-01-02 ENCOUNTER — Ambulatory Visit (HOSPITAL_BASED_OUTPATIENT_CLINIC_OR_DEPARTMENT_OTHER): Payer: Medicare Other | Admitting: Pulmonary Disease

## 2023-01-02 VITALS — BP 124/82 | HR 54 | Resp 16 | Ht <= 58 in | Wt 131.0 lb

## 2023-01-02 DIAGNOSIS — D869 Sarcoidosis, unspecified: Secondary | ICD-10-CM | POA: Diagnosis not present

## 2023-01-02 DIAGNOSIS — Z79899 Other long term (current) drug therapy: Secondary | ICD-10-CM

## 2023-01-02 NOTE — Progress Notes (Signed)
Subjective:   PATIENT ID: Destiny Roth: female DOB: December 18, 1950, MRN: [id number]   HPI  Chief Complaint  Patient presents with   Follow-up    Sleep study was last night. Breathing has been pretty good.     Reason for Visit: Follow-up  [name] is a 72 year old with Parkinson's disease, HTN, HLD and restrictive lung disease who presents for follow-up for sarcoid management.  Synopsis:  Initially seen as an inpatient Pulmonary consult by me on 02/23/19 for hypercalcemia. Bone biopsy was performed and demonstrated granulomas disease without necrosis. She was started on steroids and discharged with Endocrine and Pulmonary follow-up. Of note, she was previously seen in our Pulmonary clinic in 2018 for dyspnea and mild sleep apnea. Her prior PFTs demonstrate mild restrictive defect with normal DLCO which patient stated was attributed to her Parkinson's disease.  2021 - She is currently on tacrolimus 1 mg BID, methotrexate and prednisone. She continues to have fatigue and visual blurriness. Her dyspnea also remains unchanged. Worsens with exertion. Tacrolimus was discontinued by ophthalmology due tremors. Started on CellCept in November 2021.  2022 - Weaned off methotrexate. She still continues to have issues with fatigue, tremors and word finding after switching to Cellcept. When seen by her neurologist, she reports that her Parkinson's is stable. She was referred to Dr. Yevonne Roth to evaluate for neuro involvement of her sarcoid. Ophthalmology appt 03/14/20 ocular exam shows stable inflammation. Her ophthalmology note from 05/16/20 she has requested stopping taking immunosuppressants to see if these are the cause of her symptoms of fatigue, memory and tremor issues. Her fatigue and tremors have improved off the cellcept however it remains persistent. Unclear if her memory is better off cellcept. Started on Humira on 07/11/20. Started on Amantadine for progressive dyskinesia  related to her Parkinsons. She is unsure if the inhaler helps but she is compliant with Incruse daily. She was seen by Neurology and gabapentin increased for newly diagnosed small fiber neuropathy. She reports that her vision is still distorted and occurs in the day but worsens at night.  2023 - Ophthalmology noted by Dr. Sherryll Roth on 01/16/21 reviewed with no signs of active inflammation on eye exam. She reports that she is struggling with her lower extremity neuropathy which worsens her balance. She has been compliant with methotrexate and Humira. She underwent spine surgery (C and L) in October and held her immunosuppression temporarily. She is having some hair loss that started with methotrexate use. She reports her vision is unchanged. Having balance issues but thought to be due to Parkinsons. 2024 - Parkinson's has progressed and needing more support with dizziness. Discontinued Incruse due to cankar sores. Weaned off methotrexate in March due to concern for side effects, however unable to differentiate between chronic med issues vs side effects of med.  07/24/22 Compliant with Humira. Reports no shortness of breath but does have minor productive cough that improves with expelling sputum once a day. Husband reports she is fatigue. She is not active at baseline. Feels dizzy with standing, not sure if related. Was previously in physical therapy but not currently. Has been trying to cut back on sedating meds including gabapentin and requip as tolerated.   10/24/22 Since our last visit she is less active and working with physical therapy. Feels her Parkinson's has progressed. When standing she has back pain, trembling and head fullness. She has visual hallucinations that is slightly worse. Remains fatigued that is unchanged on or off immunosuppressants. Does complain of  more shortness of breath. No cough. Planning to go to beach next week. Currently only on Humira. Husband present and provides support during  visit.  01/02/23 Since our last visit she has been seen by [facility] with Dr. Sherryll Roth - no active inflammation on 11/12/22 exam. She reports she is having more bad days than good. Husband reports that she is sedentary and lays in the couch or computer for hours. She reports that she is fatigued and feels she is unable to exercise or moving regularly. It takes her hours to shower and get ready in the morning because the Parkinsons slows her down. Previously had back problems and now extending to her neck. Regarding her respiratory symptoms and has occasional sputum production. Breathing is not her primary issue.  Social History: Never smoker Previously Merchandiser, retail for admission at [facility]   Past Medical History:  Diagnosis Date   Abdominal pain, epigastric 09/18/2009   Anosmia 11/06/2010   ANXIETY 09/23/2006   BUNION, RIGHT FOOT 09/18/2009   CARPAL TUNNEL SYNDROME, BILATERAL 09/23/2006   DEPRESSION 09/23/2006   DIVERTICULOSIS, COLON 09/23/2006   GERD 05/24/2008   GLUCOSE INTOLERANCE 05/24/2008   Hemorrhoids    Hypercalcemia    HYPERLIPIDEMIA 09/23/2006   HYPERTENSION 09/23/2006   IBS 09/23/2006   Impaired glucose tolerance 11/04/2010   OSTEOPOROSIS 05/24/2008   06/01/19-pt states osteopenia   Parkinson's disease (HCC)    Restrictive lung disease 12/20/2013   Sarcoidosis 02/26/2019      Outpatient Medications Prior to Visit  Medication Sig Dispense Refill   acetaminophen (TYLENOL) 500 MG tablet Take 500 mg by mouth every 6 (six) hours as needed for mild pain.     Adalimumab (HUMIRA, 2 PEN,) 40 MG/0.4ML PNKT Inject 40 mg into the skin every 14 (fourteen) days. 1 kit - 2 pens 6 each 3   albuterol (VENTOLIN HFA) 108 (90 Base) MCG/ACT inhaler Inhale 2 puffs into the lungs every 6 (six) hours as needed for wheezing or shortness of breath. 8 g 2   Amantadine HCl 100 MG tablet Take 100 mg by mouth in the morning, at noon, and at bedtime.     aspirin EC 81 MG tablet Take 81 mg by mouth daily.     Biotin 1  MG CAPS Take 1 mg by mouth daily at 2 PM. 30 capsule 5   Carbidopa-Levodopa ER (SINEMET CR) 25-100 MG tablet controlled release Take 1 tablet by mouth 2 (two) times daily.     gabapentin (NEURONTIN) 300 MG capsule Take 300 mg by mouth 3 (three) times daily.     memantine (NAMENDA) 5 MG tablet Take 10 mg by mouth 2 (two) times daily. Patient has not started it yet as she wants to know more about it     RESTASIS MULTIDOSE 0.05 % ophthalmic emulsion 1 drop 2 (two) times daily.     rOPINIRole (REQUIP) 1 MG tablet Take 1 tablet (1 mg total) by mouth 3 (three) times daily. (Patient taking differently: Take 1 mg by mouth in the morning and at bedtime.) 90 tablet 0   rosuvastatin (CRESTOR) 20 MG tablet Take 1 tablet (20 mg total) by mouth daily. 90 tablet 3   senna (SENOKOT) 8.6 MG TABS tablet Take 1 tablet (8.6 mg total) by mouth 2 (two) times daily. 30 tablet 0   sertraline (ZOLOFT) 100 MG tablet Take 1 tablet by mouth daily.     No facility-administered medications prior to visit.    Review of Systems  Constitutional:  Positive for malaise/fatigue.  Negative for chills, diaphoresis, fever and weight loss.  HENT:  Negative for congestion.   Respiratory:  Negative for cough, hemoptysis, sputum production, shortness of breath and wheezing.   Cardiovascular:  Negative for chest pain, palpitations and leg swelling.   Objective:   Vitals:   01/02/23 1429  BP: 124/82  Pulse: (!) 54  Resp: 16  SpO2: 99%  Weight: 131 lb (59.4 kg)  Height: 4' 9.5" (1.461 m)     SpO2: 99 %  Physical Exam: General: Well-appearing, no acute distress HENT: [place], AT Eyes: EOMI, no scleral icterus Respiratory: Clear to auscultation bilaterally.  No crackles, wheezing or rales Cardiovascular: RRR, -M/R/G, no JVD Extremities:-Edema,-tenderness Neuro: AAO x4, CNII-XII grossly intact Psych: Normal mood, normal affect  Data Reviewed:  Imaging: CXR 02/23/19 - interval resolutions of bilateral pleural effusions CT  Chest 02/09/19 - Bilateral pleural effusions. Mild ground glass opacifications bilaterally. No enlarged mediastinal or hilar adenopathy. PET Myocardial 01/07/20 - No evidence of increased uptake PET/CT 01/07/20 1. Bilateral ground-glass pulmonary opacities demonstrate mild-moderate FDG avidity. These findings are nonspecific and may be infectious or inflammatory.2. Mild FDG activity of non-enlarged bilateral hilar lymph nodes is also nonspecific.  This could be reactive in the setting of the pulmonary findings, but activity related to sarcoid cannot be excluded.  CXR 03/30/20 - No infiltrate, effusion or edema.  PET Myocardial 03/09/21 - Persistent FDG avid bilateral hilar lymphadenopathy. New FDG avid prevascular lymph node. Increased bilateral FDG avid groundglass pulmonary opacities. These remain nonspecific and are again favored to be infectious/ inflammatory etiology. No evidence of active cardiac sarcoidosis.  CT Cardiac 07/24/22 - Visualized lung parenchyma with no pulmonary nodules, masses, infiltrate, effusion or pneumothorax.  PFT: 12/17/13 FVC 2.26 (86%) FEV1 1.88 (93%) Ratio 78  TLC 70% DLCO 75% Interpretation: Mild restrictive defect with mild reduction in gas exchange (uncorrected DLCO)  06/22/19 FVC 2.07 (95%) FEV1 1.75 (107%) Ratio 80  TLC 91% DLCO 63% Interpretation: No obstructive or restrictive defect. Mild reduction in gas exchange   06/10/20 FVC 2.11 (99%) FEV1 1.8 (113%) Ratio 82  TLC 132% RV 162% RV/TLC 130% DLCO 94% Interpretation: Normal spirometry and gas exchange. Compared to prior PFTs, patient has had increased lung volumes suggestive of air trapping. No significant bronchodilator response however does not preclude benefit. Clinically correlate  09/12/20 FVC 2.17 (102%) FEV1 1.87 (117%) Ratio 86  TLC 105% DLCO 88% Interpretation: Normal PFTs. Air trapping has resolved.  04/24/22 FVC 2.06 (100%) FEV1 1.77 (116%) Ratio 86  TLC 99% DLCO 90% Interpretation: Normal  PFTs   Echocardiogram: 04/22/19 - EF 60-65%. Grade I DD. No WMA or valvular abnormalities.  CBC    Latest Ref Rng & Units 04/16/2022    4:52 PM 02/04/2022    1:38 PM 10/31/2021    9:18 PM  CBC  WBC 4.0 - 10.5 K/uL 5.9  7.0  7.8   Hemoglobin 12.0 - 15.0 g/dL 24.4  01.0  27.2   Hematocrit 36.0 - 46.0 % 42.3  41.1  35.2   Platelets 150.0 - 400.0 K/uL 307.0  340.0  221   No new CBC     Latest Ref Rng & Units 10/22/2022    4:39 PM 07/17/2022    2:31 PM 04/16/2022    4:52 PM  BMP  Glucose 70 - 99 mg/dL 536  90  644   BUN 8 - 27 mg/dL 28  31  34   Creatinine 0.57 - 1.00 mg/dL 0.34  7.42  5.95   BUN/Creat  Ratio 12 - 28 17  24     Sodium 134 - 144 mmol/L 142  139  139   Potassium 3.5 - 5.2 mmol/L 5.0  4.5  4.0   Chloride 96 - 106 mmol/L 103  101  104   CO2 20 - 29 mmol/L 20  28  26    Calcium 8.7 - 10.3 mg/dL 72.5  36.6  44.0   Kidney function worsened and calcium is mildly elevated     Latest Ref Rng & Units 11/05/2022   11:57 AM 10/22/2022    4:39 PM 07/17/2022    2:31 PM  Hepatic Function  Total Protein 6.0 - 8.5 g/dL 7.4  7.4  7.6   Albumin 3.8 - 4.8 g/dL 4.5  4.3    AST 0 - 40 IU/L 49  75  24   ALT 0 - 32 IU/L 48  43  16   Alk Phosphatase 44 - 121 IU/L 86  82    Total Bilirubin 0.0 - 1.2 mg/dL 0.4  0.4  0.5   Bilirubin, Direct 0.00 - 0.40 mg/dL 3.47     Liver enzymes noted to be increased     Latest Ref Rng & Units 07/17/2022    2:31 PM  Quantiferon TB Gold  Quantiferon TB Gold Plus NEGATIVE NEGATIVE     Assessment & Plan:   Discussion: 72 year old female with sarcoid, Parkinson's disease who presents for sarcoid follow-up. Weaned off methotrexate March 2024 and currently on Humira as monotherapy. We had previously discussed low threshold to restart additional immunosuppression for progressive sarcoid however patient concerned about side effects of medications in setting of her progressive Parkinson's. Will need additional labs +/- PET to determine future immunosuppression  plans.  Sarcoid with pulm, ocular, renal, bone and small fiber neuropathy involvement --Dx 02/26/19 vis bone biopsy at [facility] --Stable PFTs. Last completed 04/24/22 with normal PFTs --Echo reviewed from 03/2019 with grade I DD. EKG 11/01/21 overall normal. No prolonged PR interval --Sarcoid PET 12/2019 - negative for cardiac involvement. Suggests pulmonary involvement --Sarcoid PET 03/09/21 - active pulmonary involvement. Neg cardiac involvement.  --Will plan to repeat PET/CT pending your labs  High risk medication management --Transitioned from Tacrolimus to Cellcept 11/2019 --Completed prednisone taper 03/2020 --Started methotrexate 04/2020 --Started cellcept 07/2020. Methotrexate concurrently weaned d/t intolerance --Prednisone taper started with cellcept discontinuation d/t intolerance 04/2020 --Immunosuppressant holiday  --Started Humira on 07/11/20 for persistent ocular symptoms --S/p prednisone taper from Dec 2022 to Jan 2023 --Readded methotrexate 02/2021-03/2022 --Off methotrexate --Continue Humira. Eye exam with no inflammation on 04/30/22. Previously not quiescent on 11/2021 visit.  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 Glenna Fellows. Will CC notes --Will need to resume monthly labs if restarted on Cellcept --Repeat labs in 2 months  Hypercalcemia secondary to sarcoid - Remains mildly elevated --Serial monitoring --Management as  above --Follow-up on next labs. May be due to discontinuing methotrexate --May need to consider Infliximab  Small fiber neuropathy secondary to sarcoid Parkinson's disease  --MRI Brain 03/2019 neg for neurosarcoid --Spine MRI 02/2020 neg for spinal sarcoid  --Followed by [facility]: Dr. Raquel Sarna for Parkinson's  --Followed [facility] Dr. Champ Mungo at [facility]. On gabapentin --Continue Namenda --On Carbidopa/levodopa and amantadine  Hair loss/alopecia --CONTINUE biotin 1 mg daily  Health Maintenance Immunization History  Administered Date(s) Administered   Influenza Split 12/06/2019   Influenza, High Dose Seasonal PF 11/30/2015, 11/16/2016, 10/29/2017, 10/30/2018, 12/09/2018, 10/31/2020   Influenza, Seasonal, Injecte, Preservative Fre 10/20/2012   Influenza,inj,Quad PF,6+ Mos 11/18/2013   Influenza-Unspecified 11/18/2013, 11/30/2014, 10/29/2017, 10/29/2018   Moderna Sars-Covid-2 Vaccination 03/12/2019, 04/09/2019, 12/06/2019, 05/18/2020   Pfizer Covid-19 Vaccine Bivalent Booster 26yrs & up 06/05/2021   Pneumococcal Conjugate-13 01/01/2013, 01/05/2014   Pneumococcal Polysaccharide-23 01/01/2013, 01/05/2014, 02/25/2020   Tdap 11/06/2010, 05/01/2021   Zoster Recombinant(Shingrix) 01/10/2018, 03/31/2018   Zoster, Live 11/06/2010, 01/15/2018, 03/20/2018   CT Lung Screen - not qualified  Orders Placed This Encounter  Procedures   CBC with Differential   Comp Met (CMET)   No orders of the defined types were placed in this encounter.  Return in about 2 months (around 03/05/2023).   I have spent a total time of 40-minutes on the day of the appointment including chart review, data review, collecting history, coordinating care and  discussing medical diagnosis and plan with the patient/family. Past medical history, allergies, medications were reviewed. Pertinent imaging, labs and tests included in this note have been reviewed and interpreted independently by me.  Nakshatra Klose Mechele Collin, MD [facility] 01/02/2023 2:47 PM  Office Number [phone]

## 2023-01-02 NOTE — Patient Instructions (Addendum)
Sarcoid --Will plan to repeat PET/CT pending your labs --ORDER labs: CBC, CMET --Pending on your results, we may need to stop Humira and start infliximab. I will call you regarding the results and plan

## 2023-01-03 ENCOUNTER — Other Ambulatory Visit: Payer: Self-pay | Admitting: Pharmacist

## 2023-01-03 ENCOUNTER — Telehealth: Payer: Self-pay | Admitting: Pharmacy Technician

## 2023-01-03 LAB — COMPREHENSIVE METABOLIC PANEL
ALT: 19 [IU]/L (ref 0–32)
AST: 30 [IU]/L (ref 0–40)
Albumin: 4.7 g/dL (ref 3.8–4.8)
Alkaline Phosphatase: 96 [IU]/L (ref 44–121)
BUN/Creatinine Ratio: 18 (ref 12–28)
BUN: 36 mg/dL — ABNORMAL HIGH (ref 8–27)
Bilirubin Total: 0.3 mg/dL (ref 0.0–1.2)
CO2: 22 mmol/L (ref 20–29)
Calcium: 10.8 mg/dL — ABNORMAL HIGH (ref 8.7–10.3)
Chloride: 103 mmol/L (ref 96–106)
Creatinine, Ser: 1.97 mg/dL — ABNORMAL HIGH (ref 0.57–1.00)
Globulin, Total: 2.8 g/dL (ref 1.5–4.5)
Glucose: 130 mg/dL — ABNORMAL HIGH (ref 70–99)
Potassium: 4.5 mmol/L (ref 3.5–5.2)
Sodium: 145 mmol/L — ABNORMAL HIGH (ref 134–144)
Total Protein: 7.5 g/dL (ref 6.0–8.5)
eGFR: 26 mL/min/{1.73_m2} — ABNORMAL LOW (ref >59)

## 2023-01-03 LAB — CBC WITH DIFFERENTIAL/PLATELET
Basophils Absolute: 0 10*3/uL (ref 0.0–0.2)
Basos: 0 %
EOS (ABSOLUTE): 0 10*3/uL (ref 0.0–0.4)
Eos: 0 %
Hematocrit: 43.2 % (ref 34.0–46.6)
Hemoglobin: 14.3 g/dL (ref 11.1–15.9)
Immature Grans (Abs): 0 10*3/uL (ref 0.0–0.1)
Immature Granulocytes: 0 %
Lymphocytes Absolute: 0.8 10*3/uL (ref 0.7–3.1)
Lymphs: 12 %
MCH: 31.2 pg (ref 26.6–33.0)
MCHC: 33.1 g/dL (ref 31.5–35.7)
MCV: 94 fL (ref 79–97)
Monocytes Absolute: 0.4 10*3/uL (ref 0.1–0.9)
Monocytes: 6 %
Neutrophils Absolute: 5.7 10*3/uL (ref 1.4–7.0)
Neutrophils: 82 %
Platelets: 342 10*3/uL (ref 150–450)
RBC: 4.59 x10E6/uL (ref 3.77–5.28)
RDW: 12.1 % (ref 11.7–15.4)
WBC: 7 10*3/uL (ref 3.4–10.8)

## 2023-01-03 NOTE — Telephone Encounter (Signed)
Auth Submission: NO AUTH NEEDED Site of care: Site of care: [facility] Payer: MEDICARE A/B & AARP Medication & CPT/J Code(s) submitted: Remicade (Infliximab) J1745 Route of submission (phone, fax, portal):  Phone # Fax # Auth type: Buy/Bill PB Units/visits requested: 200MG  DAY0, DAY14, DAY42, THEN Q8WKS Reference number:  Approval from: 01/03/23 to 02/28/23

## 2023-01-03 NOTE — Progress Notes (Signed)
Therapy plan placed for Remicade [phone]) for [facility] to start benefits investigation  Diagnosis: panuveitis, sarcoidosis,   Provider: Dr. Luciano Cutter  Dose: 3 mg/kg at weeks 0, 2, and 6 then 3 mg/kg every 8 weeks thereater  Last Clinic Visit: 01/02/2023 Next Clinic Visit: 03/18/23  Chesley Mires, PharmD, MPH, BCPS, CPP Clinical Pharmacist (Rheumatology and Pulmonology)

## 2023-01-03 NOTE — Progress Notes (Signed)
Therapy plan placed for Remicade at [facility]

## 2023-01-06 DIAGNOSIS — I129 Hypertensive chronic kidney disease with stage 1 through stage 4 chronic kidney disease, or unspecified chronic kidney disease: Secondary | ICD-10-CM | POA: Diagnosis not present

## 2023-01-06 DIAGNOSIS — G4733 Obstructive sleep apnea (adult) (pediatric): Secondary | ICD-10-CM | POA: Diagnosis not present

## 2023-01-06 DIAGNOSIS — D869 Sarcoidosis, unspecified: Secondary | ICD-10-CM | POA: Diagnosis not present

## 2023-01-06 DIAGNOSIS — N1831 Chronic kidney disease, stage 3a: Secondary | ICD-10-CM | POA: Diagnosis not present

## 2023-01-06 DIAGNOSIS — N179 Acute kidney failure, unspecified: Secondary | ICD-10-CM | POA: Diagnosis not present

## 2023-01-07 NOTE — Progress Notes (Signed)
First Remicade infusion scheduled for 02/04/2023

## 2023-01-09 DIAGNOSIS — G20B1 Parkinson's disease with dyskinesia, without mention of fluctuations: Secondary | ICD-10-CM | POA: Diagnosis not present

## 2023-02-03 ENCOUNTER — Other Ambulatory Visit: Payer: Medicare Other

## 2023-02-03 ENCOUNTER — Other Ambulatory Visit: Payer: Self-pay

## 2023-02-03 DIAGNOSIS — Z79899 Other long term (current) drug therapy: Secondary | ICD-10-CM | POA: Diagnosis not present

## 2023-02-03 DIAGNOSIS — E785 Hyperlipidemia, unspecified: Secondary | ICD-10-CM

## 2023-02-03 DIAGNOSIS — I2510 Atherosclerotic heart disease of native coronary artery without angina pectoris: Secondary | ICD-10-CM

## 2023-02-04 ENCOUNTER — Ambulatory Visit: Payer: Medicare Other

## 2023-02-04 LAB — HEPATIC FUNCTION PANEL
ALT: 12 [IU]/L (ref 0–32)
AST: 28 [IU]/L (ref 0–40)
Albumin: 4.5 g/dL (ref 3.8–4.8)
Alkaline Phosphatase: 95 [IU]/L (ref 44–121)
Bilirubin Total: 0.3 mg/dL (ref 0.0–1.2)
Bilirubin, Direct: 0.14 mg/dL (ref 0.00–0.40)
Total Protein: 7.5 g/dL (ref 6.0–8.5)

## 2023-02-04 LAB — NMR, LIPOPROFILE
Cholesterol, Total: 151 mg/dL (ref 100–199)
HDL Particle Number: 47.3 umol/L (ref >=30.5)
HDL-C: 59 mg/dL (ref >39)
LDL Particle Number: 770 nmol/L (ref <1000)
LDL Size: 19.7 nmol — ABNORMAL LOW (ref >20.5)
LDL-C (NIH Calc): 53 mg/dL (ref 0–99)
LP-IR Score: 67 — ABNORMAL HIGH (ref <=45)
Small LDL Particle Number: 600 nmol/L — ABNORMAL HIGH (ref <=527)
Triglycerides: 251 mg/dL — ABNORMAL HIGH (ref 0–149)

## 2023-02-06 DIAGNOSIS — G4733 Obstructive sleep apnea (adult) (pediatric): Secondary | ICD-10-CM | POA: Diagnosis not present

## 2023-02-06 DIAGNOSIS — G4752 REM sleep behavior disorder: Secondary | ICD-10-CM | POA: Diagnosis not present

## 2023-02-11 ENCOUNTER — Telehealth: Payer: Self-pay

## 2023-02-11 DIAGNOSIS — H539 Unspecified visual disturbance: Secondary | ICD-10-CM | POA: Diagnosis not present

## 2023-02-11 DIAGNOSIS — G20B1 Parkinson's disease with dyskinesia, without mention of fluctuations: Secondary | ICD-10-CM | POA: Diagnosis not present

## 2023-02-11 NOTE — Telephone Encounter (Signed)
 Auth Submission: NO AUTH NEEDED Site of care: Site of care: [facility] Payer: MEDICARE A/B & AARP Medication & CPT/J Code(s) submitted: Remicade  (Infliximab ) J1745 Route of submission (phone, fax, portal):  Phone # Fax # Auth type: Buy/Bill PB Units/visits requested: 200MG  x 7 doses Reference number:  Approval from: 01/03/23 to 02/28/24

## 2023-02-14 NOTE — Progress Notes (Signed)
Remicade infusion r/s for 03/12/2023

## 2023-03-06 ENCOUNTER — Emergency Department (HOSPITAL_COMMUNITY): Payer: Medicare Other

## 2023-03-06 ENCOUNTER — Emergency Department (HOSPITAL_COMMUNITY)
Admission: EM | Admit: 2023-03-06 | Discharge: 2023-03-06 | Disposition: A | Discharge status: Home / Self Care | Payer: Medicare Other | Attending: Emergency Medicine | Admitting: Emergency Medicine

## 2023-03-06 ENCOUNTER — Other Ambulatory Visit: Payer: Self-pay

## 2023-03-06 ENCOUNTER — Telehealth: Payer: Self-pay | Admitting: Pulmonary Disease

## 2023-03-06 ENCOUNTER — Encounter (HOSPITAL_COMMUNITY): Payer: Self-pay | Admitting: Emergency Medicine

## 2023-03-06 DIAGNOSIS — R4701 Aphasia: Secondary | ICD-10-CM | POA: Diagnosis not present

## 2023-03-06 DIAGNOSIS — Z7982 Long term (current) use of aspirin: Secondary | ICD-10-CM | POA: Diagnosis not present

## 2023-03-06 DIAGNOSIS — R471 Dysarthria and anarthria: Secondary | ICD-10-CM | POA: Insufficient documentation

## 2023-03-06 DIAGNOSIS — R4182 Altered mental status, unspecified: Secondary | ICD-10-CM | POA: Diagnosis not present

## 2023-03-06 DIAGNOSIS — I6523 Occlusion and stenosis of bilateral carotid arteries: Secondary | ICD-10-CM | POA: Diagnosis not present

## 2023-03-06 DIAGNOSIS — I700 Atherosclerosis of aorta: Secondary | ICD-10-CM | POA: Diagnosis not present

## 2023-03-06 LAB — COMPREHENSIVE METABOLIC PANEL
ALT: 7 U/L (ref 0–44)
AST: 45 U/L — ABNORMAL HIGH (ref 15–41)
Albumin: 3.7 g/dL (ref 3.5–5.0)
Alkaline Phosphatase: 74 U/L (ref 38–126)
Anion gap: 12 (ref 5–15)
BUN: 23 mg/dL (ref 8–23)
CO2: 23 mmol/L (ref 22–32)
Calcium: 9.8 mg/dL (ref 8.9–10.3)
Chloride: 103 mmol/L (ref 98–111)
Creatinine, Ser: 1.24 mg/dL — ABNORMAL HIGH (ref 0.44–1.00)
GFR, Estimated: 46 mL/min — ABNORMAL LOW (ref >60)
Glucose, Bld: 93 mg/dL (ref 70–99)
Potassium: 4.6 mmol/L (ref 3.5–5.1)
Sodium: 138 mmol/L (ref 135–145)
Total Bilirubin: 1 mg/dL (ref 0.0–1.2)
Total Protein: 7.1 g/dL (ref 6.5–8.1)

## 2023-03-06 LAB — URINALYSIS, ROUTINE W REFLEX MICROSCOPIC
Bilirubin Urine: NEGATIVE
Glucose, UA: NEGATIVE mg/dL
Hgb urine dipstick: NEGATIVE
Ketones, ur: NEGATIVE mg/dL
Nitrite: NEGATIVE
Protein, ur: NEGATIVE mg/dL
Specific Gravity, Urine: 1.025 (ref 1.005–1.030)
pH: 6 (ref 5.0–8.0)

## 2023-03-06 LAB — T4, FREE: Free T4: 0.96 ng/dL (ref 0.61–1.12)

## 2023-03-06 LAB — URINALYSIS, MICROSCOPIC (REFLEX)

## 2023-03-06 LAB — CBC
HCT: 41.3 % (ref 36.0–46.0)
Hemoglobin: 13.4 g/dL (ref 12.0–15.0)
MCH: 30.4 pg (ref 26.0–34.0)
MCHC: 32.4 g/dL (ref 30.0–36.0)
MCV: 93.7 fL (ref 80.0–100.0)
Platelets: 252 10*3/uL (ref 150–400)
RBC: 4.41 MIL/uL (ref 3.87–5.11)
RDW: 12.9 % (ref 11.5–15.5)
WBC: 7 10*3/uL (ref 4.0–10.5)
nRBC: 0 % (ref 0.0–0.2)

## 2023-03-06 LAB — TSH: TSH: 1.662 u[IU]/mL (ref 0.350–4.500)

## 2023-03-06 LAB — CBG MONITORING, ED: Glucose-Capillary: 85 mg/dL (ref 70–99)

## 2023-03-06 NOTE — Telephone Encounter (Signed)
 [name] husband states patient's calcium  levels maybe low. Would like to know if she needs labwork. [name] phone number is [phone].

## 2023-03-06 NOTE — Telephone Encounter (Signed)
 Spoke with pts husband and he states she is confused, unsteady on her feet and just not herself. He wanted to know if she should see you in office I did explain to him that you are out of the office until 2/17 and that she needs to go to the ER for evaluation.

## 2023-03-06 NOTE — ED Provider Triage Note (Signed)
 Emergency Medicine Provider Triage Evaluation Note  Destiny Roth  [name] , a 73 y.o. female  was evaluated in triage.  Pt complains of confusion over the past few days worse in the afternoon.  Accompanied by husband who states she has been speaking abnormally as well.  Review of Systems  Positive:  Negative:   Physical Exam  BP (!) 152/68 (BP Location: Right Arm)   Pulse 78   Temp (!) 97.4 F (36.3 C)   Resp 19   Wt 59 kg   SpO2 100%   BMI 27.64 kg/m  Gen:   Awake, no distress   Resp:  Normal effort  MSK:   Moves extremities without difficulty  Other:  Neuro without gross deficit negative Fleeta  Medical Decision Making  Medically screening exam initiated at 4:15 PM.  Appropriate orders placed.  [name]  A Teater was informed that the remainder of the evaluation will be completed by another provider, this initial triage assessment does not replace that evaluation, and the importance of remaining in the ED until their evaluation is complete.     [name] DEL, PA-C 03/06/23 [phone]

## 2023-03-06 NOTE — Discharge Instructions (Addendum)
 Please follow-up with your neurologist.  Your head CT, and your blood work is reassuring today.  Please return if you feel like your symptoms are worsening.  This may be secondary to your Parkinson's, and thus you should follow-up with your neurologist for further evaluation

## 2023-03-06 NOTE — Progress Notes (Signed)
 Attempted to place line, unsuccessful 3x sticks.

## 2023-03-06 NOTE — ED Provider Notes (Signed)
 [place] EMERGENCY DEPARTMENT AT [facility] Provider Note   CSN: [id number] Arrival date & time: 03/06/23  1530     History  Chief Complaint  Patient presents with   Altered Mental Status    Destiny Roth is a 73 y.o. female, hx of Parkinson's, sarcoidosis, who presents to the ED 2/2 with difficulty with word finding and getting her speech out that has been going on for the last 3 or 4 days.  Has been at bedside states that patient has this, normally because of her Parkinson's, however it is gotten much worse in the last 3 to 4 days.  States that she has difficulty having a normal conversation, because the word finding is so difficult.  States that it reminds him of when her calcium  was very high.  Would like that checked out.  Patient denies any infectious symptoms including shortness of breath, abdominal pain, nausea, vomiting, diarrhea, or urinary symptoms.    Home Medications Prior to Admission medications   Medication Sig Start Date End Date Taking? Authorizing Provider  acetaminophen  (TYLENOL ) 500 MG tablet Take 500 mg by mouth every 6 (six) hours as needed for mild pain.    [provider]  Adalimumab  (HUMIRA , 2 PEN,) 40 MG/0.4ML PNKT Inject 40 mg into the skin every 14 (fourteen) days. 1 kit - 2 pens 06/14/22   Kassie Acquanetta Bradley, MD  albuterol  (VENTOLIN  HFA) 108 [phone] Base) MCG/ACT inhaler Inhale 2 puffs into the lungs every 6 (six) hours as needed for wheezing or shortness of breath. 10/24/22   Kassie Acquanetta Bradley, MD  Amantadine  HCl 100 MG tablet Take 100 mg by mouth in the morning, at noon, and at bedtime. 07/04/21   [provider]  aspirin  EC 81 MG tablet Take 81 mg by mouth daily.    [provider]  Biotin  1 MG CAPS Take 1 mg by mouth daily at 2 PM. 07/23/21   Kassie Acquanetta Bradley, MD  Carbidopa -Levodopa  ER (SINEMET  CR) 25-100 MG tablet controlled release Take 1 tablet by mouth 2 (two) times daily.    [provider]  gabapentin  (NEURONTIN) 300 MG capsule Take 300 mg by mouth 3 (three) times daily.    [provider]  memantine (NAMENDA) 5 MG tablet Take 10 mg by mouth 2 (two) times daily. Patient has not started it yet as she wants to know more about it    [provider]  RESTASIS MULTIDOSE 0.05 % ophthalmic emulsion 1 drop 2 (two) times daily. 09/09/22   [provider]  rOPINIRole  (REQUIP ) 1 MG tablet Take 1 tablet (1 mg total) by mouth 3 (three) times daily. Patient taking differently: Take 1 mg by mouth in the morning and at bedtime. 02/11/19   Samtani, Jai-Gurmukh, MD  rosuvastatin  (CRESTOR ) 20 MG tablet Take 1 tablet (20 mg total) by mouth daily. 11/11/22   Okey Vina GAILS, MD  senna (SENOKOT) 8.6 MG TABS tablet Take 1 tablet (8.6 mg total) by mouth 2 (two) times daily. 02/26/19   Regalado, Belkys A, MD  sertraline  (ZOLOFT ) 100 MG tablet Take 1 tablet by mouth daily. 04/06/19   [provider]      Allergies    Myrbetriq  Gilian.gilles ], Atorvastatin , and Zocor [simvastatin]    Review of Systems   Review of Systems  Neurological:  Positive for speech difficulty. Negative for dizziness, light-headedness and headaches.    Physical Exam Updated Vital Signs BP (!) 189/81   Pulse 69   Temp 98  F (36.7 C)   Resp 16   Wt 59 kg   SpO2 96%   BMI 27.64 kg/m  Physical Exam Vitals and nursing note reviewed.  Constitutional:      General: She is not in acute distress.    Appearance: She is well-developed.  HENT:     Head: Normocephalic and atraumatic.  Eyes:     Conjunctiva/sclera: Conjunctivae normal.  Cardiovascular:     Rate and Rhythm: Normal rate and regular rhythm.     Heart sounds: No murmur heard. Pulmonary:     Effort: Pulmonary effort is normal. No respiratory distress.     Breath sounds: Normal breath sounds.  Abdominal:     Palpations: Abdomen is soft.     Tenderness: There is no abdominal tenderness.  Musculoskeletal:        General: No swelling.     Cervical  back: Neck supple.  Skin:    General: Skin is warm and dry.     Capillary Refill: Capillary refill takes less than 2 seconds.  Neurological:     Mental Status: She is alert.     Sensory: Sensation is intact.     Motor: Tremor present.     Comments: Tremor at baseline.  Difficulty with word finding.  No slurred speech. No other neurodeficits. Well appearing. 5/5 strength of BUE and BLE  Psychiatric:        Mood and Affect: Mood normal.     ED Results / Procedures / Treatments   Labs (all labs ordered are listed, but only abnormal results are displayed) Labs Reviewed  COMPREHENSIVE METABOLIC PANEL - Abnormal; Notable for the following components:      Result Value   Creatinine, Ser 1.24 (*)    AST 45 (*)    GFR, Estimated 46 (*)    All other components within normal limits  URINALYSIS, ROUTINE W REFLEX MICROSCOPIC - Abnormal; Notable for the following components:   Leukocytes,Ua Miyoshi Ligas (*)    All other components within normal limits  URINALYSIS, MICROSCOPIC (REFLEX) - Abnormal; Notable for the following components:   Bacteria, UA RARE (*)    All other components within normal limits  CBC  TSH  T4, FREE  CBG MONITORING, ED    EKG EKG Interpretation Date/Time:  Thursday March 06 2023 16:18:40 EST Ventricular Rate:  73 PR Interval:  146 QRS Duration:  82 QT Interval:  400 QTC Calculation: 440 R Axis:   7  Text Interpretation: Normal sinus rhythm Nonspecific ST abnormality Abnormal ECG When compared with ECG of 31-Oct-2021 21:22, PREVIOUS ECG IS PRESENT Confirmed by Cottie Cough [phone]) on 03/06/2023 4:50:37 PM  Radiology CT Head Wo Contrast Result Date: 03/06/2023 CLINICAL DATA:  Mental status change EXAM: CT HEAD WITHOUT CONTRAST TECHNIQUE: Contiguous axial images were obtained from the base of the skull through the vertex without intravenous contrast. RADIATION DOSE REDUCTION: This exam was performed according to the departmental dose-optimization program which  includes automated exposure control, adjustment of the mA and/or kV according to patient size and/or use of iterative reconstruction technique. COMPARISON:  Head CT 02/05/2019 FINDINGS: Brain: No evidence of acute infarction, hemorrhage, hydrocephalus, extra-axial collection or mass lesion/mass effect. There is stable moderate diffuse atrophy. Vascular: Atherosclerotic calcifications are present within the cavernous internal carotid arteries. Skull: Normal. Negative for fracture or focal lesion. Sinuses/Orbits: No acute finding. Other: None. IMPRESSION: 1. No acute intracranial process. 2. Stable moderate diffuse atrophy. Electronically Signed   By: Greig Pique M.D.   On: 03/06/2023  17:56   DG Chest 2 View Result Date: 03/06/2023 CLINICAL DATA:  Altered mental status. EXAM: CHEST - 2 VIEW COMPARISON:  10/31/2021 FINDINGS: Both lungs are clear. Heart and mediastinum are within normal limits. Trachea is midline. Atherosclerotic calcifications at the aortic arch. Degenerative endplate changes in thoracic spine. No large pleural effusions. Shortening of the lateral right clavicle compatible with postoperative changes. IMPRESSION: No active cardiopulmonary disease. Electronically Signed   By: Juliene Balder M.D.   On: 03/06/2023 16:41    Procedures Procedures    Medications Ordered in ED Medications - No data to display  ED Course/ Medical Decision Making/ A&P                                 Medical Decision Making Patient is a 73 year old female, history of Parkinson's, here with worsening word difficulty and ability to communicate.  Has been states has gotten worse over the last few days, wanted to get checked out reminded him of when she had a high calcium .  We obtain calcium , TSH, T4, CT of the head, given to rule out any infarcts, as well as chest x-ray and urinalysis to rule out any kind of infectious causes.  She is not confused, just difficulty getting her words out.  She has a history of this, but  is gotten worse.  Amount and/or Complexity of Data Reviewed Labs: ordered.    Details: No acute findings Radiology:     Details: CT head, and chest x-ray clear Discussion of management or test interpretation with external provider(s): Discussed with patient, blood work is reassuring, I discussed with the husband, and stated that they feel reassured, they just want to make sure that her calcium  was within normal limits.  They originally scheduled to see her neurologist today, and her making appointment for next week.  They have close follow-up, based off the notes, and review, it aligns with probable progressive Parkinson's disease.  I instructed them to follow-up with PCP, neurology, for further evaluation.  We discussed red flag symptoms.  She has no progressive weakness which is reassuring.  Discharged home with close follow-up with neurology   Final Clinical Impression(s) / ED Diagnoses Final diagnoses:  Dysarthria    Rx / DC Orders ED Discharge Orders     None         Philippa, Lyle CROME, PA 03/06/23 2111    Cottie Donnice PARAS, MD 03/07/23 [phone]

## 2023-03-06 NOTE — Progress Notes (Signed)
 Phlebotomy to stick

## 2023-03-06 NOTE — ED Triage Notes (Addendum)
 Presents from home for increasingly AMS over the last several days. Has been hospitalized in past for abnormal Ca levels in past with similar sx. Speech is easily understood, pt knows she is confused, ex unable to recall month. Husband at bedside  H/o parkinson, sarcoidosis  Endorses foot pain, chronic Denies fever

## 2023-03-10 NOTE — Telephone Encounter (Signed)
 Agree with ER recommendation for new symptoms of confusion. Patient was seen in ED on 03/06/23 with reassuring work-up including CT head and blood work. Closing encounter.

## 2023-03-12 ENCOUNTER — Ambulatory Visit: Payer: Medicare Other

## 2023-03-12 VITALS — BP 173/93 | HR 68 | Temp 97.5°F | Resp 20 | Ht <= 58 in | Wt 133.6 lb

## 2023-03-12 DIAGNOSIS — D869 Sarcoidosis, unspecified: Secondary | ICD-10-CM | POA: Diagnosis not present

## 2023-03-12 DIAGNOSIS — H44113 Panuveitis, bilateral: Secondary | ICD-10-CM

## 2023-03-12 MED ORDER — INFLIXIMAB 100 MG IV SOLR
3.0000 mg/kg | Freq: Once | INTRAVENOUS | Status: AC
  Administered 2023-03-12: 200 mg via INTRAVENOUS
  Filled 2023-03-12: qty 20

## 2023-03-12 MED ORDER — METHYLPREDNISOLONE SODIUM SUCC 40 MG IJ SOLR
40.0000 mg | Freq: Once | INTRAMUSCULAR | Status: AC
  Administered 2023-03-12: 40 mg via INTRAVENOUS
  Filled 2023-03-12: qty 1

## 2023-03-12 MED ORDER — DIPHENHYDRAMINE HCL 25 MG PO CAPS
25.0000 mg | ORAL_CAPSULE | Freq: Once | ORAL | Status: AC
  Administered 2023-03-12: 25 mg via ORAL
  Filled 2023-03-12: qty 1

## 2023-03-12 MED ORDER — ACETAMINOPHEN 325 MG PO TABS
650.0000 mg | ORAL_TABLET | Freq: Once | ORAL | Status: AC
  Administered 2023-03-12: 650 mg via ORAL
  Filled 2023-03-12: qty 2

## 2023-03-12 NOTE — Progress Notes (Signed)
Diagnosis: Panuveitis and Sarcoidosis  Provider:  Chilton Greathouse MD  Procedure: IV Infusion  IV Type: Peripheral, IV Location: L Forearm  Remicade (Infliximab), Dose: 200 mg  Infusion Start Time: 1208  Infusion Stop Time: 1423  Post Infusion IV Care: Observation period completed and Peripheral IV Discontinued  Discharge: Condition: Good, Destination: Home . AVS Provided  Performed by:  Wyvonne Lenz, RN

## 2023-03-12 NOTE — Patient Instructions (Signed)
Infliximab Injection What is this medication? INFLIXIMAB (in FLIX i mab) treats autoimmune conditions, such as psoriasis, arthritis, Crohn's disease, and ulcerative colitis. It works by slowing down an overactive immune system. It belongs to a group of medications called TNF inhibitors. It is a monoclonal antibody. This medicine may be used for other purposes; ask your health care provider or pharmacist if you have questions. COMMON BRAND NAME(S): AVSOLA, INFLECTRA, IXIFI, Remicade, RENFLEXIS, Zymfentra What should I tell my care team before I take this medication? They need to know if you have any of these conditions: Cancer Current or past resident of [place] or [place] Diabetes Exposure to tuberculosis Guillain-Barre syndrome Heart failure Liver disease Immune system problems Infection Lung or breathing disease, such as COPD Multiple sclerosis Receiving phototherapy for the skin Seizure disorder An unusual or allergic reaction to infliximab, mouse proteins, other medications, foods, dyes, or preservatives Pregnant or trying to get pregnant Breast-feeding How should I use this medication? This medication is injected into a vein or under the skin. If it is injected into a vein, it is given by your care team in a hospital or clinic setting. If it is injected under the skin, it may be given at home. If you get this medication at home, you will be taught how to prepare and give it. Use exactly as directed. Take it as directed on the prescription label at the same time every day. Keep taking it unless your care team tells you to stop. It is important that you put your used needles and syringes in a special sharps container. Do not put them in a trash can. If you do not have a sharps container, call your pharmacist or care team to get one. A special MedGuide will be given to you by the pharmacist with each prescription and refill. Be sure to read this information carefully each  time. Talk to your care team about the use of this medication in children. While it may be prescribed for children as young as 51 years of age for selected conditions, precautions do apply. Overdosage: If you think you have taken too much of this medicine contact a poison control center or emergency room at once. NOTE: This medicine is only for you. Do not share this medicine with others. What if I miss a dose? If you get this medication at the hospital or clinic: It is important not to miss your dose. Call your care team if you are unable to keep an appointment. If you give yourself this medication at home: If you miss a dose, take it as soon as you can. Then give the next dose 2 weeks later and continue your normal schedule. If it is almost time for your next dose, take only that dose. Do not take double or extra doses. Call your care team with questions. What may interact with this medication? Do not take this medication with any of the following: Biologic medications, such as abatacept, adalimumab, anakinra, certolizumab, etanercept, golimumab, rituximab, secukinumab, tocilizumab, tofactinib, ustekinumab Live vaccines This list may not describe all possible interactions. Give your health care provider a list of all the medicines, herbs, non-prescription drugs, or dietary supplements you use. Also tell them if you smoke, drink alcohol, or use illegal drugs. Some items may interact with your medicine. What should I watch for while using this medication? Visit your care team for regular checks on your progress. Your condition will be monitored carefully while you are receiving this medication. You may  need blood work done while you are taking this medication. You will be tested for tuberculosis (TB) before you start this medication. If your care team prescribes any medication for TB, you should start taking the TB medication before starting this medication. Make sure to finish the full course of TB  medication. This medication may increase your risk of getting an infection. Call your care team for advice if you get a fever, chills, sore throat, or other symptoms of a cold or flu. Do not treat yourself. Try to avoid being around people who are sick. This medication may make the symptoms of heart failure worse in some patients. Contact your care team right away if you develop signs or symptoms of heart failure. Before having surgery or dental work, talk to your care team to make sure it is ok. This medication can increase the risk of poor healing of your surgical site or wound. If you take this medication for plaque psoriasis, stay out of the sun. If you cannot avoid being in the sun, wear protective clothing and sunscreen. Do not use sun lamps or tanning beds/booths. Talk to your care team about your risk of cancer. You may be more at risk for certain types of cancers if you take this medication. What side effects may I notice from receiving this medication? Side effects that you should report to your care team as soon as possible: Allergic reactions--skin rash, itching, hives, swelling of the face, lips, tongue, or throat Body pain, tingling, or numbness Heart attack--pain or tightness in the chest, shoulders, arms, or jaw, nausea, shortness of breath, cold or clammy skin, feeling faint or lightheaded Heart failure--shortness of breath, swelling of the ankles, feet, or hands, sudden weight gain, unusual weakness or fatigue Heart rhythm changes--fast or irregular heartbeat, dizziness, feeling faint or lightheaded, chest pain, trouble breathing Increase in blood pressure Infection--fever, chills, cough, sore throat, wounds that don't heal, pain or trouble when passing urine, general feeling of discomfort or being unwell Liver injury--right upper belly pain, loss of appetite, nausea, light-colored stool, dark yellow or brown urine, yellowing skin or eyes, unusual weakness or fatigue Low blood  pressure--dizziness, feeling faint or lightheaded, blurry vision Lupus-like syndrome--joint pain, swelling, or stiffness, butterfly-shaped rash on the face, rashes that get worse in the sun, fever, unusual weakness or fatigue Seizures Stroke--sudden numbness or weakness of the face, arm, or leg, trouble speaking, confusion, trouble walking, loss of balance or coordination, dizziness, severe headache, change in vision Sudden vision loss in one or both eyes Unusual bruising or bleeding Side effects that usually do not require medical attention (report to your care team if they continue or are bothersome): Cough Diarrhea Fatigue Headache Nausea Runny or stuffy nose Sore throat Stomach pain This list may not describe all possible side effects. Call your doctor for medical advice about side effects. You may report side effects to FDA at 1-800-FDA-1088. Where should I keep my medication? Keep out of the reach of children and pets. See product for storage information. Each product may have different instructions. Get rid of any unused medication after the expiration date. To get rid of medications that are no longer needed or have expired: Take the medication to a medication take-back program. Check with your pharmacy or law enforcement to find a location. If you cannot return the medication, ask your pharmacist or care team how to get rid of this medication safely. NOTE: This sheet is a summary. It may not cover all possible information. If  you have questions about this medicine, talk to your doctor, pharmacist, or health care provider.  2024 Elsevier/Gold Standard (2022-01-09 00:00:00)

## 2023-03-13 ENCOUNTER — Encounter: Payer: Self-pay | Admitting: Pharmacist

## 2023-03-13 NOTE — Addendum Note (Signed)
Addended by: Murrell Redden on: 03/13/2023 11:02 AM   Modules accepted: Orders

## 2023-03-18 ENCOUNTER — Ambulatory Visit (HOSPITAL_BASED_OUTPATIENT_CLINIC_OR_DEPARTMENT_OTHER): Payer: Medicare Other | Admitting: Pulmonary Disease

## 2023-03-18 ENCOUNTER — Encounter (HOSPITAL_BASED_OUTPATIENT_CLINIC_OR_DEPARTMENT_OTHER): Payer: Self-pay | Admitting: Pulmonary Disease

## 2023-03-18 VITALS — BP 124/82 | HR 76 | Ht <= 58 in | Wt 132.1 lb

## 2023-03-18 DIAGNOSIS — R9389 Abnormal findings on diagnostic imaging of other specified body structures: Secondary | ICD-10-CM | POA: Diagnosis not present

## 2023-03-18 DIAGNOSIS — D869 Sarcoidosis, unspecified: Secondary | ICD-10-CM | POA: Diagnosis not present

## 2023-03-18 DIAGNOSIS — R443 Hallucinations, unspecified: Secondary | ICD-10-CM | POA: Diagnosis not present

## 2023-03-18 DIAGNOSIS — R591 Generalized enlarged lymph nodes: Secondary | ICD-10-CM

## 2023-03-18 NOTE — Patient Instructions (Addendum)
Sarcoidosis --Transitioned from Humira to Infliximab --Will need labs with each infusion: CBC, CMET --ORDER PET/CT for 08/2023

## 2023-03-18 NOTE — Progress Notes (Signed)
 Subjective:   PATIENT ID: Destiny Roth: female DOB: Jun 01, 1950, MRN: [id number]   HPI  Chief Complaint  Patient presents with   Follow-up    Sarcoidosis    Reason for Visit: Follow-up  Destiny Roth is a 73 year old with Parkinson's disease, HTN, HLD and restrictive lung disease who presents for follow-up for sarcoid management.  Synopsis:  Initially seen as an inpatient Pulmonary consult by me on 02/23/19 for hypercalcemia. Bone biopsy was performed and demonstrated granulomas disease without necrosis. She was started on steroids and discharged with Endocrine and Pulmonary follow-up. Of note, she was previously seen in our Pulmonary clinic in 2018 for dyspnea and mild sleep apnea. Her prior PFTs demonstrate mild restrictive defect with normal DLCO which patient stated was attributed to her Parkinson's disease.  2021 - She is currently on tacrolimus 1 mg BID, methotrexate and prednisone. She continues to have fatigue and visual blurriness. Her dyspnea also remains unchanged. Worsens with exertion. Tacrolimus was discontinued by ophthalmology due tremors. Started on CellCept in November 2021.  2022 - Weaned off methotrexate. She still continues to have issues with fatigue, tremors and word finding after switching to Cellcept. When seen by her neurologist, she reports that her Parkinson's is stable. She was referred to Dr. Yevonne Pax to evaluate for neuro involvement of her sarcoid. Ophthalmology appt 03/14/20 ocular exam shows stable inflammation. Her ophthalmology note from 05/16/20 she has requested stopping taking immunosuppressants to see if these are the cause of her symptoms of fatigue, memory and tremor issues. Her fatigue and tremors have improved off the cellcept however it remains persistent. Unclear if her memory is better off cellcept. Started on Humira on 07/11/20. Started on Amantadine for progressive dyskinesia related to her Parkinsons. She is unsure if the  inhaler helps but she is compliant with Incruse daily. She was seen by Neurology and gabapentin increased for newly diagnosed small fiber neuropathy. She reports that her vision is still distorted and occurs in the day but worsens at night.  2023 - Ophthalmology noted by Dr. Sherryll Burger on 01/16/21 reviewed with no signs of active inflammation on eye exam. She reports that she is struggling with her lower extremity neuropathy which worsens her balance. She has been compliant with methotrexate and Humira. She underwent spine surgery (C and L) in October and held her immunosuppression temporarily. She is having some hair loss that started with methotrexate use. She reports her vision is unchanged. Having balance issues but thought to be due to Parkinsons. 2024 - Parkinson's has progressed and needing more support with dizziness. Discontinued Incruse due to cankar sores. Weaned off methotrexate in March due to concern for side effects, however unable to differentiate between chronic med issues vs side effects of med.  07/24/22 Compliant with Humira. Reports no shortness of breath but does have minor productive cough that improves with expelling sputum once a day. Husband reports she is fatigue. She is not active at baseline. Feels dizzy with standing, not sure if related. Was previously in physical therapy but not currently. Has been trying to cut back on sedating meds including gabapentin and requip as tolerated.   10/24/22 Since our last visit she is less active and working with physical therapy. Feels her Parkinson's has progressed. When standing she has back pain, trembling and head fullness. She has visual hallucinations that is slightly worse. Remains fatigued that is unchanged on or off immunosuppressants. Does complain of more shortness of breath. No cough. [place]. Currently only on Humira. Husband present and provides support during visit.  01/02/23 Since our last visit she has been  seen by [facility] with Dr. Sherryll Burger - no active inflammation on 11/12/22 exam. She reports she is having more bad days than good. Husband reports that she is sedentary and lays in the couch or computer for hours. She reports that she is fatigued and feels she is unable to exercise or moving regularly. It takes her hours to shower and get ready in the morning because the Parkinsons slows her down. Previously had back problems and now extending to her neck. Regarding her respiratory symptoms and has occasional sputum production. Breathing is not her primary issue.  03/18/23 Since our last visit she is continuing to have trouble with dizziness and gait. For her sarcoid she transitioned from Humira to Infliximab 03/12/23. Husband/patient reports feeling "high" but unsure if she already felt this way before taking the infusion. Ok with continuing but wonders if this was normal. Husband reports one of her ropinirole was reduced so may be contributing to change in mental status/paranoia.   Social History: Never smoker Previously Merchandiser, retail for admission at [facility]   Past Medical History:  Diagnosis Date   Abdominal pain, epigastric 09/18/2009   Anosmia 11/06/2010   ANXIETY 09/23/2006   BUNION, RIGHT FOOT 09/18/2009   CARPAL TUNNEL SYNDROME, BILATERAL 09/23/2006   DEPRESSION 09/23/2006   DIVERTICULOSIS, COLON 09/23/2006   GERD 05/24/2008   GLUCOSE INTOLERANCE 05/24/2008   Hemorrhoids    Hypercalcemia    HYPERLIPIDEMIA 09/23/2006   HYPERTENSION 09/23/2006   IBS 09/23/2006   Impaired glucose tolerance 11/04/2010   OSTEOPOROSIS 05/24/2008   06/01/19-pt states osteopenia   Parkinson's disease (HCC)    Restrictive lung disease 12/20/2013   Sarcoidosis 02/26/2019      Outpatient Medications Prior to Visit  Medication Sig Dispense Refill   acetaminophen (TYLENOL) 500 MG tablet Take 500 mg by mouth every 6 (six) hours as needed for mild pain.     albuterol (VENTOLIN HFA) 108 (90 Base) MCG/ACT inhaler Inhale 2 puffs  into the lungs every 6 (six) hours as needed for wheezing or shortness of breath. 8 g 2   Amantadine HCl 100 MG tablet Take 100 mg by mouth in the morning, at noon, and at bedtime.     aspirin EC 81 MG tablet Take 81 mg by mouth daily.     Biotin 1 MG CAPS Take 1 mg by mouth daily at 2 PM. 30 capsule 5   Carbidopa-Levodopa ER (SINEMET CR) 25-100 MG tablet controlled release Take 1 tablet by mouth 2 (two) times daily.     gabapentin (NEURONTIN) 300 MG capsule Take 300 mg by mouth 3 (three) times daily.     inFLIXimab (REMICADE IV) Inject into the vein. Infuse 3 mg/kg at weeks 0, 2, and 6 then 3 mg/kg every 8 weeks thereater ([facility])     memantine (NAMENDA) 5 MG tablet Take 10 mg by mouth 2 (two) times daily. Patient has not started it yet as she wants to know more about it     RESTASIS MULTIDOSE 0.05 % ophthalmic emulsion 1 drop 2 (two) times daily.     rOPINIRole (REQUIP) 1 MG tablet Take 1 tablet (1 mg total) by mouth 3 (three) times daily. (Patient taking differently: Take 1 mg by mouth in the morning and at bedtime.) 90 tablet 0   rosuvastatin (CRESTOR) 20 MG tablet Take 1 tablet (20 mg total)  by mouth daily. 90 tablet 3   senna (SENOKOT) 8.6 MG TABS tablet Take 1 tablet (8.6 mg total) by mouth 2 (two) times daily. 30 tablet 0   sertraline (ZOLOFT) 100 MG tablet Take 1 tablet by mouth daily.     No facility-administered medications prior to visit.    Review of Systems  Constitutional:  Positive for malaise/fatigue. Negative for chills, diaphoresis, fever and weight loss.  HENT:  Negative for congestion.   Respiratory:  Negative for cough, hemoptysis, sputum production, shortness of breath and wheezing.   Cardiovascular:  Negative for chest pain, palpitations and leg swelling.  Neurological:  Positive for dizziness.   Objective:   Vitals:   03/18/23 1404  BP: 124/82  Pulse: 76  SpO2: 93%  Weight: 132 lb 1.6 oz (59.9 kg)  Height: 4' 9.5" (1.461 m)     SpO2:  93 %  Physical Exam: General: Well-appearing, no acute distress HENT: [place], AT Eyes: EOMI, no scleral icterus Respiratory: Clear to auscultation bilaterally.  No crackles, wheezing or rales Cardiovascular: RRR, -M/R/G, no JVD Extremities:-Edema,-tenderness Neuro: AAO x4, CNII-XII grossly intact Psych: Normal mood, normal affect   Data Reviewed:  Imaging: CXR 02/23/19 - interval resolutions of bilateral pleural effusions CT Chest 02/09/19 - Bilateral pleural effusions. Mild ground glass opacifications bilaterally. No enlarged mediastinal or hilar adenopathy. PET Myocardial 01/07/20 - No evidence of increased uptake PET/CT 01/07/20 1. Bilateral ground-glass pulmonary opacities demonstrate mild-moderate FDG avidity. These findings are nonspecific and may be infectious or inflammatory.2. Mild FDG activity of non-enlarged bilateral hilar lymph nodes is also nonspecific.  This could be reactive in the setting of the pulmonary findings, but activity related to sarcoid cannot be excluded.  CXR 03/30/20 - No infiltrate, effusion or edema.  PET Myocardial 03/09/21 - Persistent FDG avid bilateral hilar lymphadenopathy. New FDG avid prevascular lymph node. Increased bilateral FDG avid groundglass pulmonary opacities. These remain nonspecific and are again favored to be infectious/ inflammatory etiology. No evidence of active cardiac sarcoidosis.  CT Cardiac 07/24/22 - Visualized lung parenchyma with no pulmonary nodules, masses, infiltrate, effusion or pneumothorax.  PFT: 12/17/13 FVC 2.26 (86%) FEV1 1.88 (93%) Ratio 78  TLC 70% DLCO 75% Interpretation: Mild restrictive defect with mild reduction in gas exchange (uncorrected DLCO)  06/22/19 FVC 2.07 (95%) FEV1 1.75 (107%) Ratio 80  TLC 91% DLCO 63% Interpretation: No obstructive or restrictive defect. Mild reduction in gas exchange   06/10/20 FVC 2.11 (99%) FEV1 1.8 (113%) Ratio 82  TLC 132% RV 162% RV/TLC 130% DLCO 94% Interpretation: Normal spirometry  and gas exchange. Compared to prior PFTs, patient has had increased lung volumes suggestive of air trapping. No significant bronchodilator response however does not preclude benefit. Clinically correlate  09/12/20 FVC 2.17 (102%) FEV1 1.87 (117%) Ratio 86  TLC 105% DLCO 88% Interpretation: Normal PFTs. Air trapping has resolved.  04/24/22 FVC 2.06 (100%) FEV1 1.77 (116%) Ratio 86  TLC 99% DLCO 90% Interpretation: Normal PFTs   Echocardiogram: 04/22/19 - EF 60-65%. Grade I DD. No WMA or valvular abnormalities.  CBC    Latest Ref Rng & Units 03/06/2023    7:39 PM 01/02/2023    3:01 PM 04/16/2022    4:52 PM  CBC  WBC 4.0 - 10.5 K/uL 7.0  7.0  5.9   Hemoglobin 12.0 - 15.0 g/dL 16.1  09.6  04.5   Hematocrit 36.0 - 46.0 % 41.3  43.2  42.3   Platelets 150 - 400 K/uL 252  342  307.0   Stable  blood counts     Latest Ref Rng & Units 03/06/2023    7:39 PM 01/02/2023    3:01 PM 10/22/2022    4:39 PM  BMP  Glucose 70 - 99 mg/dL 93  161  096   BUN 8 - 23 mg/dL 23  36  28   Creatinine 0.44 - 1.00 mg/dL 0.45  4.09  8.11   BUN/Creat Ratio 12 - 28  18  17    Sodium 135 - 145 mmol/L 138  145  142   Potassium 3.5 - 5.1 mmol/L 4.6  4.5  5.0   Chloride 98 - 111 mmol/L 103  103  103   CO2 22 - 32 mmol/L 23  22  20    Calcium 8.9 - 10.3 mg/dL 9.8  91.4  78.2   Calcium level normalized Improved Cr     Latest Ref Rng & Units 03/06/2023    7:39 PM 02/03/2023    4:05 PM 01/02/2023    3:01 PM  Hepatic Function  Total Protein 6.5 - 8.1 g/dL 7.1  7.5  7.5   Albumin 3.5 - 5.0 g/dL 3.7  4.5  4.7   AST 15 - 41 U/L 45  28  30   ALT 0 - 44 U/L 7  12  19    Alk Phosphatase 38 - 126 U/L 74  95  96   Total Bilirubin 0.0 - 1.2 mg/dL 1.0  0.3  0.3   Bilirubin, Direct 0.00 - 0.40 mg/dL  9.56    Mildly elevated AST     Latest Ref Rng & Units 07/17/2022    2:31 PM  Quantiferon TB Gold  Quantiferon TB Gold Plus NEGATIVE NEGATIVE     Assessment & Plan:   Discussion: 73 year old female with sarcoid, Parkinson's  disease who presents for sarcoid follow-up. Weaned off methotrexate March 2024. Transitioned from Humira to Infliximab 03/12/23. Tolerating except for possible "high" feeling but believes this preceded starting infliximab. Will monitor.   We discussed the clinical course of sarcoid and management including serial PFTs, labs, eye exam, and EKG and chest imaging if indicated. If symptoms suggest sarcoid flare in the future, we would manage with steroids +/- biologics.  Sarcoid with pulm, ocular, renal, bone and small fiber neuropathy involvement --Dx 02/26/19 vis bone [facility] --Stable PFTs. Last completed 04/24/22 with normal PFTs --Echo reviewed from 03/2019 with grade I DD. EKG 11/01/21 overall normal. No prolonged PR interval --Sarcoid PET 12/2019 - negative for cardiac involvement. Suggests pulmonary involvement --Sarcoid PET 03/09/21 - active pulmonary involvement. Neg cardiac involvement.  --ORDER PET/CT for 08/2023  High risk medication management --Transitioned from Tacrolimus to Cellcept 11/2019 --Completed prednisone taper 03/2020 --Started methotrexate 04/2020 --Started cellcept 07/2020. Methotrexate concurrently weaned d/t intolerance --Prednisone taper started with cellcept discontinuation d/t intolerance 04/2020 --Immunosuppressant holiday  --Started Humira on 07/11/20 for persistent ocular symptoms --S/p prednisone taper from Dec 2022 to Jan 2023 --Readded methotrexate 02/2021-03/2022 --Off Humira and transitioned to Infliximab 03/2023 --Eye exam with no inflammation on 04/30/22. Previously not quiescent on 11/2021 visit.  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 Glenna Fellows. Will CC notes --Will need to resume monthly labs if restarted on Cellcept --Trend labs as above  Hypercalcemia secondary to sarcoid - Recent labs with normalization --Serial monitoring --Management as above  Small fiber neuropathy secondary to sarcoid Parkinson's disease  --MRI Brain 03/2019 neg for neurosarcoid --Spine MRI 02/2020 neg for spinal sarcoid  --Followed by [facility]: Dr. Raquel Sarna for Parkinson's  --Followed [facility] Dr[facility] Champ Mungo at [facility]. On gabapentin --Continue Namenda --On Carbidopa/levodopa and amantadine  Hair loss/alopecia --CONTINUE biotin 1 mg daily  Health Maintenance Immunization History  Administered Date(s) Administered   Influenza Split 12/06/2019   Influenza, High Dose Seasonal PF 11/30/2015, 11/16/2016, 10/29/2017, 10/30/2018, 12/09/2018, 10/31/2020   Influenza, Seasonal, Injecte, Preservative Fre 10/20/2012   Influenza,inj,Quad PF,6+ Mos 11/18/2013   Influenza-Unspecified 11/18/2013, 11/30/2014, 10/29/2017, 10/29/2018   Moderna Sars-Covid-2 Vaccination 03/12/2019, 04/09/2019, 12/06/2019, 05/18/2020   Pfizer Covid-19 Vaccine Bivalent Booster 69yrs & up 06/05/2021   Pneumococcal Conjugate-13 01/01/2013, 01/05/2014   Pneumococcal Polysaccharide-23 01/01/2013, 01/05/2014, 02/25/2020   Tdap 11/06/2010, 05/01/2021   Zoster Recombinant(Shingrix) 01/10/2018, 03/31/2018   Zoster, Live 11/06/2010,  01/15/2018, 03/20/2018   CT Lung Screen - not qualified  Orders Placed This Encounter  Procedures   NM PET Image Restage (PS) Skull Base to Thigh (F-18 FDG)    Standing Status:   Future    Expiration Date:   03/17/2024    Scheduling Instructions:     August 2025    If indicated for the ordered procedure, I authorize the administration of a radiopharmaceutical per Radiology protocol:   Yes    Preferred imaging location?:   [facility]   CBC with Differential   Comp Met (CMET)   No orders of the defined types were placed in this encounter.  Return for end of April 2025. 15 or 30 min slot.   I have spent a total time of 35-minutes on the day of the appointment including chart review, data review, collecting history, coordinating care and discussing medical diagnosis and plan with the patient/family. Past medical history, allergies, medications were reviewed. Pertinent imaging, labs and tests included in this note have been reviewed and interpreted independently by me.  Mehak Roskelley Mechele Collin, MD [facility] 03/18/2023 2:23 PM  Office Number [phone]

## 2023-03-24 DIAGNOSIS — N179 Acute kidney failure, unspecified: Secondary | ICD-10-CM | POA: Diagnosis not present

## 2023-03-24 DIAGNOSIS — I129 Hypertensive chronic kidney disease with stage 1 through stage 4 chronic kidney disease, or unspecified chronic kidney disease: Secondary | ICD-10-CM | POA: Diagnosis not present

## 2023-03-24 DIAGNOSIS — N1831 Chronic kidney disease, stage 3a: Secondary | ICD-10-CM | POA: Diagnosis not present

## 2023-03-24 DIAGNOSIS — D869 Sarcoidosis, unspecified: Secondary | ICD-10-CM | POA: Diagnosis not present

## 2023-03-26 ENCOUNTER — Ambulatory Visit (INDEPENDENT_AMBULATORY_CARE_PROVIDER_SITE_OTHER): Payer: Medicare Other

## 2023-03-26 VITALS — BP 170/84 | HR 71 | Temp 97.8°F | Resp 16 | Ht <= 58 in | Wt 131.0 lb

## 2023-03-26 DIAGNOSIS — H44113 Panuveitis, bilateral: Secondary | ICD-10-CM

## 2023-03-26 DIAGNOSIS — D869 Sarcoidosis, unspecified: Secondary | ICD-10-CM | POA: Diagnosis not present

## 2023-03-26 MED ORDER — INFLIXIMAB 100 MG IV SOLR
3.0000 mg/kg | Freq: Once | INTRAVENOUS | Status: AC
  Administered 2023-03-26: 200 mg via INTRAVENOUS
  Filled 2023-03-26: qty 20

## 2023-03-26 MED ORDER — DIPHENHYDRAMINE HCL 25 MG PO CAPS
25.0000 mg | ORAL_CAPSULE | Freq: Once | ORAL | Status: AC
  Administered 2023-03-26: 25 mg via ORAL
  Filled 2023-03-26: qty 1

## 2023-03-26 MED ORDER — METHYLPREDNISOLONE SODIUM SUCC 40 MG IJ SOLR
40.0000 mg | Freq: Once | INTRAMUSCULAR | Status: AC
  Administered 2023-03-26: 40 mg via INTRAVENOUS
  Filled 2023-03-26: qty 1

## 2023-03-26 MED ORDER — ACETAMINOPHEN 325 MG PO TABS
650.0000 mg | ORAL_TABLET | Freq: Once | ORAL | Status: AC
  Administered 2023-03-26: 650 mg via ORAL
  Filled 2023-03-26: qty 2

## 2023-03-26 NOTE — Progress Notes (Signed)
 Diagnosis: Panuveitis, sarcoidosis  Provider:  Chilton Greathouse MD  Procedure: IV Infusion  IV Type: Peripheral, IV Location: L Antecubital  Remicade (Infliximab), Dose: 200 mg  Infusion Start Time: 1158  Infusion Stop Time: 1407  Post Infusion IV Care: Peripheral IV Discontinued  Discharge: Condition: Good, Destination: Home . AVS Provided  Performed by:  Wyvonne Lenz, RN

## 2023-04-02 DIAGNOSIS — E785 Hyperlipidemia, unspecified: Secondary | ICD-10-CM | POA: Diagnosis not present

## 2023-04-02 DIAGNOSIS — H539 Unspecified visual disturbance: Secondary | ICD-10-CM | POA: Diagnosis not present

## 2023-04-02 DIAGNOSIS — F3341 Major depressive disorder, recurrent, in partial remission: Secondary | ICD-10-CM | POA: Diagnosis not present

## 2023-04-02 DIAGNOSIS — E559 Vitamin D deficiency, unspecified: Secondary | ICD-10-CM | POA: Diagnosis not present

## 2023-04-02 DIAGNOSIS — N1832 Chronic kidney disease, stage 3b: Secondary | ICD-10-CM | POA: Diagnosis not present

## 2023-04-02 DIAGNOSIS — D869 Sarcoidosis, unspecified: Secondary | ICD-10-CM | POA: Diagnosis not present

## 2023-04-02 DIAGNOSIS — G20A1 Parkinson's disease without dyskinesia, without mention of fluctuations: Secondary | ICD-10-CM | POA: Diagnosis not present

## 2023-04-02 DIAGNOSIS — R2681 Unsteadiness on feet: Secondary | ICD-10-CM | POA: Diagnosis not present

## 2023-04-02 DIAGNOSIS — I10 Essential (primary) hypertension: Secondary | ICD-10-CM | POA: Diagnosis not present

## 2023-04-02 DIAGNOSIS — H905 Unspecified sensorineural hearing loss: Secondary | ICD-10-CM | POA: Diagnosis not present

## 2023-04-08 ENCOUNTER — Encounter (HOSPITAL_BASED_OUTPATIENT_CLINIC_OR_DEPARTMENT_OTHER): Payer: Self-pay | Admitting: Pulmonary Disease

## 2023-04-08 DIAGNOSIS — D869 Sarcoidosis, unspecified: Secondary | ICD-10-CM | POA: Diagnosis not present

## 2023-04-08 DIAGNOSIS — G20B1 Parkinson's disease with dyskinesia, without mention of fluctuations: Secondary | ICD-10-CM | POA: Diagnosis not present

## 2023-04-08 DIAGNOSIS — H539 Unspecified visual disturbance: Secondary | ICD-10-CM | POA: Diagnosis not present

## 2023-04-08 DIAGNOSIS — M549 Dorsalgia, unspecified: Secondary | ICD-10-CM | POA: Diagnosis not present

## 2023-04-22 ENCOUNTER — Ambulatory Visit (HOSPITAL_COMMUNITY)
Admission: RE | Admit: 2023-04-22 | Discharge: 2023-04-22 | Disposition: A | Discharge status: Home / Self Care | Source: Ambulatory Visit | Attending: Pulmonary Disease | Admitting: Pulmonary Disease

## 2023-04-22 DIAGNOSIS — N200 Calculus of kidney: Secondary | ICD-10-CM | POA: Insufficient documentation

## 2023-04-22 DIAGNOSIS — D869 Sarcoidosis, unspecified: Secondary | ICD-10-CM | POA: Insufficient documentation

## 2023-04-22 DIAGNOSIS — R591 Generalized enlarged lymph nodes: Secondary | ICD-10-CM | POA: Insufficient documentation

## 2023-04-22 DIAGNOSIS — R599 Enlarged lymph nodes, unspecified: Secondary | ICD-10-CM | POA: Diagnosis not present

## 2023-04-22 DIAGNOSIS — I2510 Atherosclerotic heart disease of native coronary artery without angina pectoris: Secondary | ICD-10-CM | POA: Insufficient documentation

## 2023-04-22 DIAGNOSIS — I700 Atherosclerosis of aorta: Secondary | ICD-10-CM | POA: Insufficient documentation

## 2023-04-22 DIAGNOSIS — R9389 Abnormal findings on diagnostic imaging of other specified body structures: Secondary | ICD-10-CM | POA: Insufficient documentation

## 2023-04-22 DIAGNOSIS — I517 Cardiomegaly: Secondary | ICD-10-CM | POA: Diagnosis not present

## 2023-04-22 LAB — GLUCOSE, CAPILLARY: Glucose-Capillary: 119 mg/dL — ABNORMAL HIGH (ref 70–99)

## 2023-04-22 MED ORDER — FLUDEOXYGLUCOSE F - 18 (FDG) INJECTION
6.5700 | Freq: Once | INTRAVENOUS | Status: AC
  Administered 2023-04-22: 6.57 via INTRAVENOUS

## 2023-04-23 ENCOUNTER — Other Ambulatory Visit: Payer: Self-pay

## 2023-04-23 ENCOUNTER — Ambulatory Visit (INDEPENDENT_AMBULATORY_CARE_PROVIDER_SITE_OTHER): Payer: Medicare Other

## 2023-04-23 VITALS — BP 152/75 | HR 80 | Temp 98.4°F | Resp 16 | Ht <= 58 in | Wt 130.0 lb

## 2023-04-23 DIAGNOSIS — H44113 Panuveitis, bilateral: Secondary | ICD-10-CM | POA: Diagnosis not present

## 2023-04-23 DIAGNOSIS — D869 Sarcoidosis, unspecified: Secondary | ICD-10-CM

## 2023-04-23 MED ORDER — DIPHENHYDRAMINE HCL 25 MG PO CAPS
25.0000 mg | ORAL_CAPSULE | Freq: Once | ORAL | Status: AC
  Administered 2023-04-23: 25 mg via ORAL
  Filled 2023-04-23: qty 1

## 2023-04-23 MED ORDER — ACETAMINOPHEN 325 MG PO TABS
650.0000 mg | ORAL_TABLET | Freq: Once | ORAL | Status: AC
Start: 2023-04-23 — End: 2023-04-23
  Administered 2023-04-23: 650 mg via ORAL
  Filled 2023-04-23: qty 2

## 2023-04-23 MED ORDER — SODIUM CHLORIDE 0.9 % IV SOLN
3.0000 mg/kg | Freq: Once | INTRAVENOUS | Status: AC
  Administered 2023-04-23: 200 mg via INTRAVENOUS
  Filled 2023-04-23: qty 20

## 2023-04-23 MED ORDER — METHYLPREDNISOLONE SODIUM SUCC 40 MG IJ SOLR
40.0000 mg | Freq: Once | INTRAMUSCULAR | Status: AC
  Administered 2023-04-23: 40 mg via INTRAVENOUS
  Filled 2023-04-23: qty 1

## 2023-04-23 NOTE — Progress Notes (Signed)
 Diagnosis: Sarcoidosis  Provider:  Chilton Greathouse MD  Procedure: IV Infusion  IV Type: Peripheral, IV Location: L Antecubital  Remicade (Infliximab), Dose: 200 mg  Infusion Start Time: 1146  Infusion Stop Time: 1400  Post Infusion IV Care: Peripheral IV Discontinued  Discharge: Condition: Good, Destination: Home . AVS Provided  Performed by:  Wyvonne Lenz, RN

## 2023-04-24 ENCOUNTER — Encounter (HOSPITAL_BASED_OUTPATIENT_CLINIC_OR_DEPARTMENT_OTHER): Payer: Self-pay | Admitting: Pulmonary Disease

## 2023-04-24 LAB — COMPLETE METABOLIC PANEL WITHOUT GFR
AG Ratio: 1.4 (calc) (ref 1.0–2.5)
ALT: 27 U/L (ref 6–29)
AST: 38 U/L — ABNORMAL HIGH (ref 10–35)
Albumin: 4.1 g/dL (ref 3.6–5.1)
Alkaline phosphatase (APISO): 79 U/L (ref 37–153)
BUN/Creatinine Ratio: 17 (calc) (ref 6–22)
BUN: 19 mg/dL (ref 7–25)
CO2: 25 mmol/L (ref 20–32)
Calcium: 9.4 mg/dL (ref 8.6–10.4)
Chloride: 102 mmol/L (ref 98–110)
Creat: 1.1 mg/dL — ABNORMAL HIGH (ref 0.60–1.00)
Globulin: 2.9 g/dL (ref 1.9–3.7)
Glucose, Bld: 137 mg/dL — ABNORMAL HIGH (ref 65–99)
Potassium: 4 mmol/L (ref 3.5–5.3)
Sodium: 138 mmol/L (ref 135–146)
Total Bilirubin: 0.4 mg/dL (ref 0.2–1.2)
Total Protein: 7 g/dL (ref 6.1–8.1)

## 2023-04-24 LAB — CBC WITH DIFFERENTIAL/PLATELET
Absolute Lymphocytes: 950 {cells}/uL (ref 850–3900)
Absolute Monocytes: 443 {cells}/uL (ref 200–950)
Basophils Absolute: 30 {cells}/uL (ref 0–200)
Basophils Relative: 0.5 %
Eosinophils Absolute: 41 {cells}/uL (ref 15–500)
Eosinophils Relative: 0.7 %
HCT: 38.7 % (ref 35.0–45.0)
Hemoglobin: 13.4 g/dL (ref 11.7–15.5)
MCH: 32.1 pg (ref 27.0–33.0)
MCHC: 34.6 g/dL (ref 32.0–36.0)
MCV: 92.8 fL (ref 80.0–100.0)
MPV: 9.3 fL (ref 7.5–12.5)
Monocytes Relative: 7.5 %
Neutro Abs: 4437 {cells}/uL (ref 1500–7800)
Neutrophils Relative %: 75.2 %
Platelets: 269 10*3/uL (ref 140–400)
RBC: 4.17 10*6/uL (ref 3.80–5.10)
RDW: 12.5 % (ref 11.0–15.0)
Total Lymphocyte: 16.1 %
WBC: 5.9 10*3/uL (ref 3.8–10.8)

## 2023-05-08 ENCOUNTER — Encounter (HOSPITAL_BASED_OUTPATIENT_CLINIC_OR_DEPARTMENT_OTHER): Payer: Self-pay | Admitting: Pulmonary Disease

## 2023-05-13 DIAGNOSIS — Z0000 Encounter for general adult medical examination without abnormal findings: Secondary | ICD-10-CM | POA: Diagnosis not present

## 2023-05-13 DIAGNOSIS — Z1331 Encounter for screening for depression: Secondary | ICD-10-CM | POA: Diagnosis not present

## 2023-05-13 DIAGNOSIS — Z23 Encounter for immunization: Secondary | ICD-10-CM | POA: Diagnosis not present

## 2023-05-26 ENCOUNTER — Encounter (HOSPITAL_BASED_OUTPATIENT_CLINIC_OR_DEPARTMENT_OTHER): Payer: Self-pay

## 2023-05-27 ENCOUNTER — Ambulatory Visit (HOSPITAL_BASED_OUTPATIENT_CLINIC_OR_DEPARTMENT_OTHER): Payer: Medicare Other | Admitting: Pulmonary Disease

## 2023-05-28 ENCOUNTER — Encounter: Payer: Self-pay | Admitting: Pulmonary Disease

## 2023-05-30 DIAGNOSIS — N1832 Chronic kidney disease, stage 3b: Secondary | ICD-10-CM | POA: Diagnosis not present

## 2023-05-30 DIAGNOSIS — R2681 Unsteadiness on feet: Secondary | ICD-10-CM | POA: Diagnosis not present

## 2023-05-30 DIAGNOSIS — I10 Essential (primary) hypertension: Secondary | ICD-10-CM | POA: Diagnosis not present

## 2023-05-30 DIAGNOSIS — H04129 Dry eye syndrome of unspecified lacrimal gland: Secondary | ICD-10-CM | POA: Diagnosis not present

## 2023-05-30 DIAGNOSIS — E785 Hyperlipidemia, unspecified: Secondary | ICD-10-CM | POA: Diagnosis not present

## 2023-05-30 DIAGNOSIS — F3341 Major depressive disorder, recurrent, in partial remission: Secondary | ICD-10-CM | POA: Diagnosis not present

## 2023-06-06 NOTE — Progress Notes (Unsigned)
 @Patient  ID: Destiny Roth  Destiny Roth, female    DOB: 10-24-1950, 73 y.o.   MRN: [id number]  No chief complaint on file.   Referring provider: Arva Lathe,*  HPI: 73 year old female, never smoker followed for sarcoidosis and restrictive lung disease.  She is a patient of Dr. Jacqualyn Mates and last seen in office 03/18/2023.  She had a bone biopsy in 2021 that demonstrated granulomatous disease without necrosis; she has pulmonary, ocular, renal, bone and small fiber neuropathy involvement.  Past medical history significant for Parkinson's disease, hypertension, HLD.  TEST/EVENTS:  12/17/2013 PFT: FVC 86, FEV1 93, ratio 78, TLC 70, DLCO 75.  Mild restrictive defect with mild reduction in gas exchange 02/23/2019 CXR: Interval resolutions of bilateral pleural effusions 02/09/2019 CT chest: Bilateral pleural effusions, mild groundglass opacifications bilaterally.  No enlarged mediastinal or hilar adenopathy. 04/22/2019 echo: EF 60 to 65%.  G1 DD. 06/22/2019 PFT: FVC 95, FEV1 107, ratio 80, TLC 91, DLCO 63.  No obstructive or restrictive defect.  Mild reduction in gas exchange 01/07/2020 PET myocardial: No evidence of increased uptake 01/07/2020 PET/CT: Bilateral groundglass pulmonary opacities, mild to moderate FDG avidity.  Mild activity of nonenlarged bilateral hilar lymph nodes, also nonspecific. 03/30/2020 CXR: No infiltrate, effusion or edema 06/10/2020 PFT: FVC 99, FEV1 113, ratio 82, TLC 132, DLCO 94.  Normal pulmonary function testing with some air trapping 09/12/2020 PFT: FVC 190, FEV1 117, ratio 86, TLC 105, DLCO 88.  Normal PFTs 03/09/2021 PET myocardial: Persistent FDG avid bilateral hilar lymphadenopathy and prevascular lymph node.  Increased bilateral groundglass pulmonary opacities.  No evidence of cardiac sarcoidosis 04/24/2022 PFT: FVC 100, FEV1 116, ratio 86, TLC 99, DLCO 90.  Normal PFT 04/22/2023 PET: Small left axillary lymph node, not hypermetabolic.  Mild focal hypermetabolism in  distal esophagus.  Atherosclerosis.  Heart is enlarged.  Focal hypermetabolism in ascending colon.  Low-attenuation lesion in right kidney.  No evidence of active sarcoid.  Changes of possible diverticulitis  03/18/2023: OV with Dr. Washington Hacker.  Has continued to have trouble with dizziness and gait.  She has been on prednisone  and methotrexate  in the past.  She was also on CellCept  and Humira .  Was transition to Infliximab  03/2023.  Reports feeling "high" with the infusions but unsure if she already felt this way before.  Okay with continuing.  Ropinirole  was reduced, which could question increased paranoia.  She has had stable PFTs.  Most recent echo with grade 1 diastolic dysfunction.  Last PET scan 03/09/2021 with active pulmonary involvement.  Negative cardiac involvement.  Will plan to repeat PET scan for monitoring. Prior use of Incruse d/c.  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  Followed by [facility] neurosarcoid and on management for Parkinson's disease.  Has hypercalcemia secondary to sarcoid.    High risk medication management --Transitioned from Tacrolimus to Cellcept  11/2019 --Completed prednisone  taper 03/2020 --Started methotrexate  04/2020 --Started cellcept  07/2020. Methotrexate  concurrently weaned d/t intolerance --Prednisone  taper started with cellcept  discontinuation d/t intolerance 04/2020 --Immunosuppressant holiday  --Started Humira  on 07/11/20 for persistent ocular symptoms --S/p prednisone  taper from Dec 2022 to Jan 2023 --Readded methotrexate  02/2021-03/2022 --Off Humira  and transitioned to Infliximab  03/2023 --Eye exam with no inflammation on 04/30/22. Previously not quiescent on 11/2021 visit.  --Transitioned from Humira  to Infliximab  --Will need labs with each infusion: CBC, CMET  Allergies  Allergen Reactions   Myrbetriq  [Mirabegron ] Other (See Comments)    SEVERE muscle and joint pain   Atorvastatin  Other (See Comments)    Muscle pain Muscle pain  myalgia    Zocor [Simvastatin] Other (See Comments)    Muscle pain    Immunization History  Administered Date(s) Administered   Influenza Split 12/06/2019   Influenza, High Dose Seasonal PF 11/30/2015, 11/16/2016, 10/29/2017, 10/30/2018, 12/09/2018, 10/31/2020   Influenza, Seasonal, Injecte, Preservative Fre 10/20/2012   Influenza,inj,Quad PF,6+ Mos 11/18/2013   Influenza-Unspecified 11/18/2013, 11/30/2014, 10/29/2017, 10/29/2018   Moderna Sars-Covid-2 Vaccination 03/12/2019, 04/09/2019, 12/06/2019, 05/18/2020   Pfizer Covid-19 Vaccine Bivalent Booster 77yrs & up 06/05/2021   Pneumococcal Conjugate-13 01/01/2013, 01/05/2014   Pneumococcal Polysaccharide-23 01/01/2013, 01/05/2014, 02/25/2020   Tdap 11/06/2010, 05/01/2021   Zoster Recombinant(Shingrix) 01/10/2018, 03/31/2018   Zoster, Live 11/06/2010, 01/15/2018, 03/20/2018    Past Medical History:  Diagnosis Date   Abdominal pain, epigastric 09/18/2009   Anosmia 11/06/2010   ANXIETY 09/23/2006   BUNION, RIGHT FOOT 09/18/2009   CARPAL TUNNEL SYNDROME, BILATERAL 09/23/2006   DEPRESSION 09/23/2006   DIVERTICULOSIS, COLON 09/23/2006   GERD 05/24/2008   GLUCOSE INTOLERANCE 05/24/2008   Hemorrhoids    Hypercalcemia    HYPERLIPIDEMIA 09/23/2006   HYPERTENSION 09/23/2006   IBS 09/23/2006   Impaired glucose tolerance 11/04/2010   OSTEOPOROSIS 05/24/2008   06/01/19-pt states osteopenia   Parkinson's disease (HCC)    Restrictive lung disease 12/20/2013   Sarcoidosis 02/26/2019    Tobacco History: Social History   Tobacco Use  Smoking Status Never   Passive exposure: Yes  Smokeless Tobacco Never   Counseling given: Not Answered   Outpatient Medications Prior to Visit  Medication Sig Dispense Refill   acetaminophen  (TYLENOL ) 500 MG tablet Take 500 mg by mouth every 6 (six) hours as needed for mild pain.     albuterol  (VENTOLIN  HFA) 108 (90 Base) MCG/ACT inhaler Inhale 2 puffs into the lungs every 6 (six) hours as needed for wheezing or shortness of  breath. 8 g 2   Amantadine  HCl 100 MG tablet Take 100 mg by mouth in the morning, at noon, and at bedtime.     aspirin  EC 81 MG tablet Take 81 mg by mouth daily.     Biotin  1 MG CAPS Take 1 mg by mouth daily at 2 PM. 30 capsule 5   Carbidopa -Levodopa  ER (SINEMET  CR) 25-100 MG tablet controlled release Take 1 tablet by mouth 2 (two) times daily.     gabapentin (NEURONTIN) 300 MG capsule Take 300 mg by mouth 3 (three) times daily.     inFLIXimab  (REMICADE  IV) Inject into the vein. Infuse 3 mg/kg at weeks 0, 2, and 6 then 3 mg/kg every 8 weeks thereater ([facility])     memantine (NAMENDA) 5 MG tablet Take 10 mg by mouth 2 (two) times daily. Patient has not started it yet as she wants to know more about it     RESTASIS MULTIDOSE 0.05 % ophthalmic emulsion 1 drop 2 (two) times daily.     rOPINIRole  (REQUIP ) 1 MG tablet Take 1 tablet (1 mg total) by mouth 3 (three) times daily. (Patient taking differently: Take 1 mg by mouth in the morning and at bedtime.) 90 tablet 0   rosuvastatin  (CRESTOR ) 20 MG tablet Take 1 tablet (20 mg total) by mouth daily. 90 tablet 3   senna (SENOKOT) 8.6 MG TABS tablet Take 1 tablet (8.6 mg total) by mouth 2 (two) times daily. 30 tablet 0   sertraline  (ZOLOFT ) 100 MG tablet Take 1 tablet by mouth daily.     No facility-administered medications prior to visit.     Review of Systems:  Constitutional: No weight loss or gain, night sweats, fevers, chills, fatigue, or lassitude. HEENT: No headaches, difficulty swallowing, tooth/dental problems, or sore throat. No sneezing, itching, ear ache, nasal congestion, or post nasal drip CV:  No chest pain, orthopnea, PND, swelling in lower extremities, anasarca, dizziness, palpitations, syncope Resp: No shortness of breath with exertion or at rest. No excess mucus or change in color of mucus. No productive or non-productive. No hemoptysis. No wheezing.  No chest wall deformity GI:  No heartburn, indigestion,  abdominal pain, nausea, vomiting, diarrhea, change in bowel habits, loss of appetite, bloody stools.  GU: No dysuria, change in color of urine, urgency or frequency.  No flank pain, no hematuria  Skin: No rash, lesions, ulcerations MSK:  No joint pain or swelling.  No decreased range of motion.  No back pain. Neuro: No dizziness or lightheadedness.  Psych: No depression or anxiety. Mood stable.     Physical Exam:  There were no vitals taken for this visit.  GEN: Pleasant, interactive, well-nourished/chronically-ill appearing/acutely-ill appearing/poorly-nourished/morbidly obese; in no acute distress.****** HEENT:  Normocephalic and atraumatic. EACs patent bilaterally. TM pearly gray with present light reflex bilaterally. PERRLA. Sclera white. Nasal turbinates pink, moist and patent bilaterally. No rhinorrhea present. Oropharynx pink and moist, without exudate or edema. No lesions, ulcerations, or postnasal drip.  NECK:  Supple w/ fair ROM. No JVD present. Normal carotid impulses w/o bruits. Thyroid  symmetrical with no goiter or nodules palpated. No lymphadenopathy.   CV: RRR, no m/r/g, no peripheral edema. Pulses intact, +2 bilaterally. No cyanosis, pallor or clubbing. PULMONARY:  Unlabored, regular breathing. Clear bilaterally A&P w/o wheezes/rales/rhonchi. No accessory muscle use.  GI: BS present and normoactive. Soft, non-tender to palpation. No organomegaly or masses detected. No CVA tenderness. MSK: No erythema, warmth or tenderness. Cap refil <2 sec all extrem. No deformities or joint swelling noted.  Neuro: A/Ox3. No focal deficits noted.   Skin: Warm, no lesions or rashe Psych: Normal affect and behavior. Judgement and thought content appropriate.     Lab Results:  CBC    Component Value Date/Time   WBC 5.9 04/23/2023 1101   RBC 4.17 04/23/2023 1101   HGB 13.4 04/23/2023 1101   HGB 14.3 01/02/2023 1501   HCT 38.7 04/23/2023 1101   HCT 43.2 01/02/2023 1501   PLT 269  04/23/2023 1101   PLT 342 01/02/2023 1501   MCV 92.8 04/23/2023 1101   MCV 94 01/02/2023 1501   MCH 32.1 04/23/2023 1101   MCHC 34.6 04/23/2023 1101   RDW 12.5 04/23/2023 1101   RDW 12.1 01/02/2023 1501   LYMPHSABS 0.8 01/02/2023 1501   MONOABS 0.5 04/16/2022 1652   EOSABS 41 04/23/2023 1101   EOSABS 0.0 01/02/2023 1501   BASOSABS 30 04/23/2023 1101   BASOSABS 0.0 01/02/2023 1501    BMET    Component Value Date/Time   NA 138 04/23/2023 1101   NA 145 (H) 01/02/2023 1501   K 4.0 04/23/2023 1101   CL 102 04/23/2023 1101   CO2 25 04/23/2023 1101   GLUCOSE 137 (H) 04/23/2023 1101   BUN 19 04/23/2023 1101   BUN 36 (H) 01/02/2023 1501   CREATININE 1.10 (H) 04/23/2023 1101   CALCIUM  9.4 04/23/2023 1101   CALCIUM  9.7 02/05/2019 0739   GFRNONAA 46 (L) 03/06/2023 1939   GFRNONAA 43 (L) 04/27/2019 1604   GFRAA 50 (L) 04/27/2019 1604    BNP No results found for: "BNP"   Imaging:  No results found.  acetaminophen  (TYLENOL ) tablet 650  mg     Date Action Dose Route User   04/23/2023 1104 Given 650 mg Oral Lendel Quant, RN      diphenhydrAMINE  (BENADRYL ) capsule 25 mg     Date Action Dose Route User   04/23/2023 1104 Given 25 mg Oral Evans, Erin E, RN      inFLIXimab  (REMICADE ) 3 mg/kg = 200 mg in sodium chloride  0.9 % 250 mL infusion     Date Action Dose Route User   04/23/2023 1319 Rate/Dose Change (none) Intravenous Lauran Pollard, LPN   3/87/5643 1251 Rate/Dose Change (none) Intravenous Lenetta Quest, RN   04/23/2023 1234 Rate/Dose Change (none) Intravenous Lauran Pollard, LPN   04/26/5186 1219 Rate/Dose Change (none) Intravenous Lenetta Quest, RN   04/23/2023 1204 Rate/Dose Change (none) Intravenous Ranabhat, Sabitri, RN      methylPREDNISolone  sodium succinate (SOLU-MEDROL ) 40 mg/mL injection 40 mg     Date Action Dose Route User   04/23/2023 1116 Given 40 mg Intravenous Lendel Quant, RN          Latest Ref Rng & Units 04/24/2022    2:58  PM 09/12/2020    3:49 PM 06/09/2020   12:07 PM 06/22/2019   12:57 PM 11/18/2013    2:57 PM  PFT Results  FVC-Pre L 2.06  2.17  2.04  2.18  2.37   FVC-Predicted Pre % 100  102  95  100  90   FVC-Post L   2.11  2.07  2.26   FVC-Predicted Post %   99  95  86   Pre FEV1/FVC % % 86  86  82  80  78   Post FEV1/FCV % %   85  84  83   FEV1-Pre L 1.77  1.87  1.67  1.74  1.84   FEV1-Predicted Pre % 116  117  105  107  92   FEV1-Post L   1.80  1.75  1.88   DLCO uncorrected ml/min/mmHg 13.43  13.15  14.11  9.55  13.75   DLCO [facility]% % 90  88  94  63  75   DLCO corrected ml/min/mmHg 13.16  13.49  14.25  9.61    DLCO COR %Predicted % 88  90  95  64    DLVA Predicted % 92  84  91  64  88   TLC L 3.96  4.19  5.24  3.62  3.09   TLC % Predicted % 99  105  132  91  70   RV % Predicted % 88  98  162  71  -2     No results found for: "NITRICOXIDE"      Assessment & Plan:   No problem-specific Assessment & Plan notes found for this encounter.   Advised if symptoms do not improve or worsen, to please contact office for sooner follow up or seek emergency care.   I spent *** minutes of dedicated to the care of this patient on the date of this encounter to include pre-visit review of records, face-to-face time with the patient discussing conditions above, post visit ordering of testing, clinical documentation with the electronic health record, making appropriate referrals as documented, and communicating necessary findings to members of the patients care team.  Roetta Clarke, NP 06/06/2023  Pt aware and understands NP's role.

## 2023-06-09 ENCOUNTER — Encounter: Payer: Self-pay | Admitting: Nurse Practitioner

## 2023-06-09 ENCOUNTER — Ambulatory Visit (INDEPENDENT_AMBULATORY_CARE_PROVIDER_SITE_OTHER): Admitting: Nurse Practitioner

## 2023-06-09 ENCOUNTER — Other Ambulatory Visit
Admission: RE | Admit: 2023-06-09 | Discharge: 2023-06-09 | Disposition: A | Discharge status: Home / Self Care | Source: Ambulatory Visit | Attending: Nurse Practitioner | Admitting: Nurse Practitioner

## 2023-06-09 VITALS — BP 102/70 | HR 110 | Temp 97.7°F | Ht <= 58 in | Wt 135.0 lb

## 2023-06-09 DIAGNOSIS — R948 Abnormal results of function studies of other organs and systems: Secondary | ICD-10-CM | POA: Insufficient documentation

## 2023-06-09 DIAGNOSIS — G20A1 Parkinson's disease without dyskinesia, without mention of fluctuations: Secondary | ICD-10-CM | POA: Diagnosis not present

## 2023-06-09 DIAGNOSIS — D869 Sarcoidosis, unspecified: Secondary | ICD-10-CM

## 2023-06-09 DIAGNOSIS — M5416 Radiculopathy, lumbar region: Secondary | ICD-10-CM

## 2023-06-09 DIAGNOSIS — Z79899 Other long term (current) drug therapy: Secondary | ICD-10-CM | POA: Diagnosis not present

## 2023-06-09 LAB — CBC WITH DIFFERENTIAL/PLATELET
Abs Immature Granulocytes: 0.03 10*3/uL (ref 0.00–0.07)
Basophils Absolute: 0 10*3/uL (ref 0.0–0.1)
Basophils Relative: 0 %
Eosinophils Absolute: 0 10*3/uL (ref 0.0–0.5)
Eosinophils Relative: 0 %
HCT: 46.6 % — ABNORMAL HIGH (ref 36.0–46.0)
Hemoglobin: 15.3 g/dL — ABNORMAL HIGH (ref 12.0–15.0)
Immature Granulocytes: 0 %
Lymphocytes Relative: 15 %
Lymphs Abs: 1.5 10*3/uL (ref 0.7–4.0)
MCH: 30.5 pg (ref 26.0–34.0)
MCHC: 32.8 g/dL (ref 30.0–36.0)
MCV: 93 fL (ref 80.0–100.0)
Monocytes Absolute: 0.6 10*3/uL (ref 0.1–1.0)
Monocytes Relative: 6 %
Neutro Abs: 7.9 10*3/uL — ABNORMAL HIGH (ref 1.7–7.7)
Neutrophils Relative %: 79 %
Platelets: 337 10*3/uL (ref 150–400)
RBC: 5.01 MIL/uL (ref 3.87–5.11)
RDW: 12.6 % (ref 11.5–15.5)
WBC: 10.1 10*3/uL (ref 4.0–10.5)
nRBC: 0 % (ref 0.0–0.2)

## 2023-06-09 LAB — COMPREHENSIVE METABOLIC PANEL WITH GFR
ALT: 7 U/L (ref 0–44)
AST: 34 U/L (ref 15–41)
Albumin: 4.1 g/dL (ref 3.5–5.0)
Alkaline Phosphatase: 66 U/L (ref 38–126)
Anion gap: 12 (ref 5–15)
BUN: 23 mg/dL (ref 8–23)
CO2: 24 mmol/L (ref 22–32)
Calcium: 10.1 mg/dL (ref 8.9–10.3)
Chloride: 105 mmol/L (ref 98–111)
Creatinine, Ser: 1.45 mg/dL — ABNORMAL HIGH (ref 0.44–1.00)
GFR, Estimated: 38 mL/min — ABNORMAL LOW
Glucose, Bld: 139 mg/dL — ABNORMAL HIGH (ref 70–99)
Potassium: 4.3 mmol/L (ref 3.5–5.1)
Sodium: 141 mmol/L (ref 135–145)
Total Bilirubin: 0.8 mg/dL (ref 0.0–1.2)
Total Protein: 8 g/dL (ref 6.5–8.1)

## 2023-06-09 NOTE — Assessment & Plan Note (Signed)
See above. Follow up with neurology as scheduled

## 2023-06-09 NOTE — Assessment & Plan Note (Signed)
 No saddle anesthesia or LE weakness/gait changes. Advised she follow up with Dr. Vaughn Georges for further workup/management.

## 2023-06-09 NOTE — Assessment & Plan Note (Addendum)
 Incidental findings of hypermetabolism in ascending colon and distal esophagus. Referred to Dr. Elvin Hammer, whom she has seen before, for further evaluation/workup.

## 2023-06-09 NOTE — Assessment & Plan Note (Signed)
 Sarcoidosis with pulmonary, ocular, renal, bone and small fiber neuropathy involvement. On infliximab  and tolerating well. Most recent PET scan without any evidence of active disease. Will repeat CBC and CMET today for high risk medication monitoring. Plan to repeat PFTs when she returns for surveillance. Continue follow up with cardiology, [facility] neurosarcoid, and optho.  Given no evidence of active disease on PET scan, suspect her brain fog is more so related to viral illness or could be secondary to her Parkinson's. Advised her to discuss with her neurologists if symptoms persist.   Patient Instructions  Good news, there was no active sarcoid on your most recent PET scan! Continue your remicade  injections as scheduled   Labs today  I sent a referral to Dr. Elvin Hammer to discuss the area in your colon and esophagus, and decide if you need a repeat colonoscopy and possible upper endoscopy. If you don't hear from his office in the next week, call them to schedule an appointment  Discuss the memory/brain fog with your neurologist. Possible this was related to a virus since you both had headaches last week but worth mentioning to them   Schedule appointment with Dr. Vaughn Georges regarding your back pain   Your last PFT was a year ago so we will plan to repeat for monitoring when you come back   Follow up in 3-4 months with Dr. Washington Hacker after PFT. If symptoms do not improve or worsen, please contact office for sooner follow up or seek emergency care.

## 2023-06-09 NOTE — Patient Instructions (Addendum)
 Good news, there was no active sarcoid on your most recent PET scan! Continue your remicade  injections as scheduled   Labs today  I sent a referral to Dr. Elvin Hammer to discuss the area in your colon and esophagus, and decide if you need a repeat colonoscopy and possible upper endoscopy. If you don't hear from his office in the next week, call them to schedule an appointment  Discuss the memory/brain fog with your neurologist. Possible this was related to a virus since you both had headaches last week but worth mentioning to them   Schedule appointment with Dr. Vaughn Georges regarding your back pain   Your last PFT was a year ago so we will plan to repeat for monitoring when you come back   Follow up in 3-4 months with Dr. Washington Hacker after PFT. If symptoms do not improve or worsen, please contact office for sooner follow up or seek emergency care.

## 2023-06-18 ENCOUNTER — Ambulatory Visit (INDEPENDENT_AMBULATORY_CARE_PROVIDER_SITE_OTHER)

## 2023-06-18 VITALS — BP 159/90 | HR 79 | Temp 97.9°F | Resp 16 | Ht <= 58 in | Wt 137.8 lb

## 2023-06-18 DIAGNOSIS — D869 Sarcoidosis, unspecified: Secondary | ICD-10-CM | POA: Diagnosis not present

## 2023-06-18 DIAGNOSIS — H44113 Panuveitis, bilateral: Secondary | ICD-10-CM | POA: Diagnosis not present

## 2023-06-18 MED ORDER — SODIUM CHLORIDE 0.9 % IV SOLN
3.0000 mg/kg | Freq: Once | INTRAVENOUS | Status: AC
  Administered 2023-06-18: 200 mg via INTRAVENOUS
  Filled 2023-06-18: qty 20

## 2023-06-18 MED ORDER — DIPHENHYDRAMINE HCL 25 MG PO CAPS
25.0000 mg | ORAL_CAPSULE | Freq: Once | ORAL | Status: AC
  Administered 2023-06-18: 25 mg via ORAL
  Filled 2023-06-18: qty 1

## 2023-06-18 MED ORDER — ACETAMINOPHEN 325 MG PO TABS
650.0000 mg | ORAL_TABLET | Freq: Once | ORAL | Status: AC
  Administered 2023-06-18: 650 mg via ORAL
  Filled 2023-06-18: qty 2

## 2023-06-18 MED ORDER — METHYLPREDNISOLONE SODIUM SUCC 40 MG IJ SOLR
40.0000 mg | Freq: Once | INTRAMUSCULAR | Status: AC
  Administered 2023-06-18: 40 mg via INTRAVENOUS
  Filled 2023-06-18: qty 1

## 2023-06-18 NOTE — Progress Notes (Signed)
 Diagnosis:   Sarcoidosis    Provider:  Praveen Mannam MD  Procedure: IV Infusion  IV Type: Peripheral, IV Location: L Antecubital  Remicade  (Infliximab ), Dose: 200 mg  Infusion Start Time: 1148  Infusion Stop Time: 1401  Post Infusion IV Care: Peripheral IV Discontinued  Discharge: Condition: Good, Destination: Home . AVS Declined  Performed by:  Star East, LPN

## 2023-06-20 ENCOUNTER — Ambulatory Visit: Payer: Self-pay | Admitting: Nurse Practitioner

## 2023-06-24 DIAGNOSIS — D869 Sarcoidosis, unspecified: Secondary | ICD-10-CM | POA: Diagnosis not present

## 2023-06-24 DIAGNOSIS — Z79899 Other long term (current) drug therapy: Secondary | ICD-10-CM | POA: Diagnosis not present

## 2023-06-24 DIAGNOSIS — H44113 Panuveitis, bilateral: Secondary | ICD-10-CM | POA: Diagnosis not present

## 2023-06-24 DIAGNOSIS — H4710 Unspecified papilledema: Secondary | ICD-10-CM | POA: Diagnosis not present

## 2023-06-24 DIAGNOSIS — H30033 Focal chorioretinal inflammation, peripheral, bilateral: Secondary | ICD-10-CM | POA: Diagnosis not present

## 2023-06-24 DIAGNOSIS — H3581 Retinal edema: Secondary | ICD-10-CM | POA: Diagnosis not present

## 2023-06-24 NOTE — Telephone Encounter (Signed)
 Called patient regarding Lab results. She didn't answer, left a message to call back.

## 2023-06-24 NOTE — Addendum Note (Signed)
 Addended by: Lonie Roa on: 06/24/2023 05:01 PM   Modules accepted: Orders

## 2023-07-02 ENCOUNTER — Encounter (HOSPITAL_BASED_OUTPATIENT_CLINIC_OR_DEPARTMENT_OTHER): Payer: Self-pay

## 2023-07-03 DIAGNOSIS — N3281 Overactive bladder: Secondary | ICD-10-CM | POA: Diagnosis not present

## 2023-07-03 DIAGNOSIS — D8689 Sarcoidosis of other sites: Secondary | ICD-10-CM | POA: Diagnosis not present

## 2023-07-03 DIAGNOSIS — M792 Neuralgia and neuritis, unspecified: Secondary | ICD-10-CM | POA: Diagnosis not present

## 2023-07-03 DIAGNOSIS — E559 Vitamin D deficiency, unspecified: Secondary | ICD-10-CM | POA: Diagnosis not present

## 2023-07-23 ENCOUNTER — Other Ambulatory Visit (INDEPENDENT_AMBULATORY_CARE_PROVIDER_SITE_OTHER)

## 2023-07-23 DIAGNOSIS — R948 Abnormal results of function studies of other organs and systems: Secondary | ICD-10-CM

## 2023-07-23 LAB — BASIC METABOLIC PANEL WITH GFR
BUN: 25 mg/dL — ABNORMAL HIGH (ref 6–23)
CO2: 26 meq/L (ref 19–32)
Calcium: 9.9 mg/dL (ref 8.4–10.5)
Chloride: 102 meq/L (ref 96–112)
Creatinine, Ser: 1.32 mg/dL — ABNORMAL HIGH (ref 0.40–1.20)
GFR: 39.68 mL/min — ABNORMAL LOW (ref >60.00)
Glucose, Bld: 130 mg/dL — ABNORMAL HIGH (ref 70–99)
Potassium: 4.1 meq/L (ref 3.5–5.1)
Sodium: 138 meq/L (ref 135–145)

## 2023-07-23 LAB — CBC WITH DIFFERENTIAL/PLATELET
Basophils Absolute: 0 10*3/uL (ref 0.0–0.1)
Basophils Relative: 0.4 % (ref 0.0–3.0)
Eosinophils Absolute: 0 10*3/uL (ref 0.0–0.7)
Eosinophils Relative: 0 % (ref 0.0–5.0)
HCT: 42.6 % (ref 36.0–46.0)
Hemoglobin: 14.4 g/dL (ref 12.0–15.0)
Lymphocytes Relative: 22.3 % (ref 12.0–46.0)
Lymphs Abs: 1.2 10*3/uL (ref 0.7–4.0)
MCHC: 33.9 g/dL (ref 30.0–36.0)
MCV: 91.5 fl (ref 78.0–100.0)
Monocytes Absolute: 0.5 10*3/uL (ref 0.1–1.0)
Monocytes Relative: 8.8 % (ref 3.0–12.0)
Neutro Abs: 3.7 10*3/uL (ref 1.4–7.7)
Neutrophils Relative %: 68.5 % (ref 43.0–77.0)
Platelets: 301 10*3/uL (ref 150.0–400.0)
RBC: 4.66 Mil/uL (ref 3.87–5.11)
RDW: 13.2 % (ref 11.5–15.5)
WBC: 5.4 10*3/uL (ref 4.0–10.5)

## 2023-07-23 NOTE — Addendum Note (Signed)
 Addended by: Faiza Bansal C on: 07/23/2023 03:17 PM   Modules accepted: Orders

## 2023-07-23 NOTE — Addendum Note (Signed)
 Addended by: Gaylen Venning C on: 07/23/2023 03:18 PM   Modules accepted: Orders

## 2023-08-13 ENCOUNTER — Ambulatory Visit (INDEPENDENT_AMBULATORY_CARE_PROVIDER_SITE_OTHER)

## 2023-08-13 VITALS — BP 153/78 | HR 78 | Temp 97.9°F | Resp 18 | Ht <= 58 in | Wt 140.6 lb

## 2023-08-13 DIAGNOSIS — D869 Sarcoidosis, unspecified: Secondary | ICD-10-CM

## 2023-08-13 DIAGNOSIS — H44113 Panuveitis, bilateral: Secondary | ICD-10-CM

## 2023-08-13 MED ORDER — DIPHENHYDRAMINE HCL 25 MG PO CAPS
25.0000 mg | ORAL_CAPSULE | Freq: Once | ORAL | Status: AC
  Administered 2023-08-13: 25 mg via ORAL
  Filled 2023-08-13: qty 1

## 2023-08-13 MED ORDER — SODIUM CHLORIDE 0.9 % IV SOLN
3.0000 mg/kg | Freq: Once | INTRAVENOUS | Status: AC
  Administered 2023-08-13: 200 mg via INTRAVENOUS
  Filled 2023-08-13: qty 20

## 2023-08-13 MED ORDER — ACETAMINOPHEN 325 MG PO TABS
650.0000 mg | ORAL_TABLET | Freq: Once | ORAL | Status: AC
  Administered 2023-08-13: 650 mg via ORAL
  Filled 2023-08-13: qty 2

## 2023-08-13 MED ORDER — METHYLPREDNISOLONE SODIUM SUCC 40 MG IJ SOLR
40.0000 mg | Freq: Once | INTRAMUSCULAR | Status: AC
  Administered 2023-08-13: 40 mg via INTRAVENOUS
  Filled 2023-08-13: qty 1

## 2023-08-13 NOTE — Progress Notes (Signed)
 Diagnosis: Sarcoidosis  Provider:  Mannam, Praveen MD  Procedure: IV Infusion  IV Type: Peripheral, IV Location: L Antecubital  Remicade  (Infliximab ), Dose: 200 mg  Infusion Start Time: 1356  Infusion Stop Time: 1607  Post Infusion IV Care: Peripheral IV Discontinued  Discharge: Condition: Good, Destination: Home . AVS Provided  Performed by:  Rocky FORBES Sar, RN

## 2023-08-13 NOTE — Patient Instructions (Signed)
 [employer] [employer] Fax: [phone]

## 2023-08-14 DIAGNOSIS — G4752 REM sleep behavior disorder: Secondary | ICD-10-CM | POA: Diagnosis not present

## 2023-08-14 DIAGNOSIS — G4733 Obstructive sleep apnea (adult) (pediatric): Secondary | ICD-10-CM | POA: Diagnosis not present

## 2023-08-25 ENCOUNTER — Encounter: Payer: Self-pay | Admitting: Pulmonary Disease

## 2023-08-25 ENCOUNTER — Ambulatory Visit (INDEPENDENT_AMBULATORY_CARE_PROVIDER_SITE_OTHER): Admitting: Internal Medicine

## 2023-08-25 ENCOUNTER — Encounter: Payer: Self-pay | Admitting: Internal Medicine

## 2023-08-25 VITALS — BP 130/80 | HR 89 | Ht <= 58 in | Wt 137.0 lb

## 2023-08-25 DIAGNOSIS — Z79899 Other long term (current) drug therapy: Secondary | ICD-10-CM | POA: Diagnosis not present

## 2023-08-25 DIAGNOSIS — K219 Gastro-esophageal reflux disease without esophagitis: Secondary | ICD-10-CM | POA: Diagnosis not present

## 2023-08-25 DIAGNOSIS — R948 Abnormal results of function studies of other organs and systems: Secondary | ICD-10-CM

## 2023-08-25 DIAGNOSIS — Z860100 Personal history of colon polyps, unspecified: Secondary | ICD-10-CM | POA: Diagnosis not present

## 2023-08-25 DIAGNOSIS — K5730 Diverticulosis of large intestine without perforation or abscess without bleeding: Secondary | ICD-10-CM

## 2023-08-25 MED ORDER — NA SULFATE-K SULFATE-MG SULF 17.5-3.13-1.6 GM/177ML PO SOLN: 1.0000 | Freq: Once | ORAL | 0 refills | Status: AC

## 2023-08-25 NOTE — Patient Instructions (Signed)
 You have been scheduled for an endoscopy and colonoscopy. Please follow the written instructions given to you at your visit today.  If you use inhalers (even only as needed), please bring them with you on the day of your procedure.  DO NOT TAKE 7 DAYS PRIOR TO TEST- Trulicity (dulaglutide) Ozempic, Wegovy (semaglutide) Mounjaro (tirzepatide) Bydureon Bcise (exanatide extended release)  DO NOT TAKE 1 DAY PRIOR TO YOUR TEST Rybelsus (semaglutide) Adlyxin (lixisenatide) Victoza (liraglutide) Byetta (exanatide) ___________________________________________________________________________ _______________________________________________________  If your blood pressure at your visit was 140/90 or greater, please contact your primary care physician to follow up on this.  _______________________________________________________  If you are age 75 or older, your body mass index should be between 23-30. Your Body mass index is 29.13 kg/m. If this is out of the aforementioned range listed, please consider follow up with your Primary Care Provider.  If you are age 54 or younger, your body mass index should be between 19-25. Your Body mass index is 29.13 kg/m. If this is out of the aformentioned range listed, please consider follow up with your Primary Care Provider.   ________________________________________________________  The [facility] providers would like to encourage you to use MYCHART to communicate with providers for non-urgent requests or questions.  Due to long hold times on the telephone, sending your provider a message by [facility] may be a faster and more efficient way to get a response.  Please allow 48 business hours for a response.  Please remember that this is for non-urgent requests.  _______________________________________________________  [facility] is using a team-based approach to care.  Your team is made up of your doctor and two to three APPS. Our APPS (Nurse  Practitioners and Physician Assistants) work with your physician to ensure care continuity for you. They are fully qualified to address your health concerns and develop a treatment plan. They communicate directly with your gastroenterologist to care for you. Seeing the Advanced Practice Practitioners on your physician's team can help you by facilitating care more promptly, often allowing for earlier appointments, access to diagnostic testing, procedures, and other specialty referrals.

## 2023-08-25 NOTE — Progress Notes (Signed)
 HISTORY OF PRESENT ILLNESS:  Destiny  A Roth is a 73 y.o. female with multiple medical problems as listed below.  She is sent today by her pulmonologist regarding abnormal PET scan findings involving the esophagus and right colon.  She is accompanied by her husband.  I last saw the patient Jun 15, 2019 when she underwent routine screening colonoscopy.  She was found to have a diminutive adenoma as well as diverticulosis involving the right colon and left colon.  Incidental internal hemorrhoids.  Normal terminal ileum.  Previous colonoscopy in 2008 was negative for neoplasia.  She also had upper endoscopy elsewhere in 2008 with a normal esophagus and stomach.  Patient does have periodic reflux symptoms for which she takes Tums.  No dysphagia.  No weight loss.  Bowel habits are unchanged.  She oscillates between constipation and diarrhea.  No bleeding.  PET scan reviewed  Laboratories from July 23, 2023 showed creatinine of 1.22 and hemoglobin 14.4  REVIEW OF SYSTEMS:  All non-GI ROS negative unless otherwise stated in the HPI except for back pain, visual change, confusion, cough, depression, fatigue, hearing problems, itching, shortness of breath, skin rash, lower extremity swelling, urinary leakage, voice change  Past Medical History:  Diagnosis Date   Abdominal pain, epigastric 09/18/2009   Anosmia 11/06/2010   ANXIETY 09/23/2006   BUNION, RIGHT FOOT 09/18/2009   CARPAL TUNNEL SYNDROME, BILATERAL 09/23/2006   DEPRESSION 09/23/2006   DIVERTICULOSIS, COLON 09/23/2006   GERD 05/24/2008   GLUCOSE INTOLERANCE 05/24/2008   Hemorrhoids    Hypercalcemia    HYPERLIPIDEMIA 09/23/2006   HYPERTENSION 09/23/2006   IBS 09/23/2006   Impaired glucose tolerance 11/04/2010   OSTEOPOROSIS 05/24/2008   06/01/19-pt states osteopenia   Parkinson's disease (HCC)    Restrictive lung disease 12/20/2013   Sarcoidosis 02/26/2019    Past Surgical History:  Procedure Laterality Date   ABDOMINAL EXPOSURE N/A  10/29/2021   Procedure: LATERAL EXPOSURE FOR OBLIQUE SPINE SURGERY;  Surgeon: Gretta Lonni PARAS, MD;  Location: [facility] OR;  Service: Vascular;  Laterality: N/A;   CARDIAC CATHETERIZATION     CATARACT EXTRACTION     COLONOSCOPY  2008   COLONOSCOPY W/ BIOPSIES  2008   Dr. Kristie -negative random bxs   ESOPHAGOGASTRODUODENOSCOPY  2008   Dr. Isaiah duodenal ulcer   FOOT SURGERY Bilateral 2015   LEFT AND RIGHT HEART CATHETERIZATION WITH CORONARY ANGIOGRAM N/A 11/09/2013   Procedure: LEFT AND RIGHT HEART CATHETERIZATION WITH CORONARY ANGIOGRAM;  Surgeon: Peter M Swaziland, MD;  Location: [facility] CATH LAB;  Service: Cardiovascular;  Laterality: N/A;   nasal septum repair     OBLIQUE LUMBAR INTERBODY FUSION 1 LEVEL WITH PERCUTANEOUS SCREWS N/A 10/29/2021   Procedure: OBLIQUE LUMBAR INTERBODY FUSION WITH SUPPLEMENTAL PEDICLE SCREW FIXATION LUMBAR FOUR TO FIVE;  Surgeon: Burnetta Aures, MD;  Location: [facility] OR;  Service: Orthopedics;  Laterality: N/A;   s/p bilat CTS     s/p fibroid ablation     s/p ganglion cyst right wrist     s/p right thumb trigger finger surgury     SHOULDER SURGERY Right 2015   TUBAL LIGATION     UPPER GASTROINTESTINAL ENDOSCOPY      Social History Destiny  A Roth  reports that she has never smoked. She has been exposed to tobacco smoke. She has never used smokeless tobacco. She reports that she does not currently use alcohol. She reports that she does not use drugs.  family history includes Heart attack in her brother and mother; Heart disease (age of  onset: 75) in her father; Hypertension in her brother, brother, brother, and father; Peripheral vascular disease in her mother.  Allergies  Allergen Reactions   Myrbetriq  [Mirabegron ] Other (See Comments)    SEVERE muscle and joint pain   Atorvastatin  Other (See Comments)    Muscle pain Muscle pain myalgia   Zocor [Simvastatin] Other (See Comments)    Muscle pain       PHYSICAL EXAMINATION: Vital signs: BP 130/80    Pulse 89   Ht 4' 9.5 (1.461 m)   Wt 137 lb (62.1 kg)   BMI 29.13 kg/m   Constitutional: generally well-appearing, no acute distress.  Parkinsonian facies Psychiatric: alert and oriented x3, cooperative Eyes: extraocular movements intact, anicteric, conjunctiva pink Mouth: oral pharynx moist, no lesions Neck: supple no lymphadenopathy Cardiovascular: heart regular rate and rhythm, no murmur Lungs: clear to auscultation bilaterally Abdomen: soft, nontender, nondistended, no obvious ascites, no peritoneal signs, normal bowel sounds, no organomegaly Rectal: Deferred till colonoscopy Extremities: no clubbing, cyanosis, or lower extremity edema bilaterally Skin: no lesions on visible extremities Neuro: No focal deficits.  Cranial nerves intact  ASSESSMENT:  1.  GERD 2.  Abnormal PET scan of the distal esophagus.  Suspect reflux esophagitis.  Rule out neoplasia 3.  History of diminutive adenoma and pandiverticulosis 4.  Abnormal PET scan in the proximal ascending colon.  Possibly diverticular inflammation.  Rule out neoplasia 5.  Multiple medical problems including sarcoidosis (in check) and Parkinson's disease  PLAN:  1.  Schedule upper endoscopy to evaluate abnormal PET scan.  Patient is higher than average risk due to her age and comorbidities.The nature of the procedure, as well as the risks, benefits, and alternatives were carefully and thoroughly reviewed with the patient. Ample time for discussion and questions allowed. The patient understood, was satisfied, and agreed to proceed. 2.  Schedule colonoscopy to evaluate abnormal PET scan.  Patient is high risk as above.The nature of the procedure, as well as the risks, benefits, and alternatives were carefully and thoroughly reviewed with the patient. Ample time for discussion and questions allowed. The patient understood, was satisfied, and agreed to proceed. 3.  Reflux precautions 4.  Okay to take PPI for frequent GERD symptoms I  total time of 60 minutes was spent preparing to see the patient, reviewing multiple outside records, laboratories, and x-rays.  Obtaining comprehensive history, performing medically appropriate physical exam, counseling and educating the patient and her husband regarding the above listed issues, ordering multiple endoscopic procedures, and documenting clinical information in the health record

## 2023-08-26 DIAGNOSIS — D869 Sarcoidosis, unspecified: Secondary | ICD-10-CM | POA: Diagnosis not present

## 2023-08-26 DIAGNOSIS — G35 Multiple sclerosis: Secondary | ICD-10-CM | POA: Diagnosis not present

## 2023-08-26 DIAGNOSIS — R9089 Other abnormal findings on diagnostic imaging of central nervous system: Secondary | ICD-10-CM | POA: Diagnosis not present

## 2023-09-05 DIAGNOSIS — L219 Seborrheic dermatitis, unspecified: Secondary | ICD-10-CM | POA: Diagnosis not present

## 2023-09-05 DIAGNOSIS — L821 Other seborrheic keratosis: Secondary | ICD-10-CM | POA: Diagnosis not present

## 2023-09-08 NOTE — Telephone Encounter (Signed)
 Refill request received from patient.    Medication Requested: Amantadine  100 mg       Carbidopa -Levodopa  25-100 mg  Last Office Visit: 07/03/2023  Next Office Visit: 10/02/2023 Last Prescriber: Dr Marci   Nurse refill requirements met? No If not met, why: Carbidopa -Levodopa  SIG needs to be updated and Amantadine   Sent to: Provider for signing If sent to provider, which provider?: Zoe Raisin

## 2023-09-16 DIAGNOSIS — D8685 Sarcoid myocarditis: Secondary | ICD-10-CM | POA: Diagnosis not present

## 2023-09-17 DIAGNOSIS — D8685 Sarcoid myocarditis: Secondary | ICD-10-CM | POA: Diagnosis not present

## 2023-09-18 DIAGNOSIS — D8685 Sarcoid myocarditis: Secondary | ICD-10-CM | POA: Diagnosis not present

## 2023-09-19 DIAGNOSIS — D8685 Sarcoid myocarditis: Secondary | ICD-10-CM | POA: Diagnosis not present

## 2023-09-22 DIAGNOSIS — D869 Sarcoidosis, unspecified: Secondary | ICD-10-CM | POA: Diagnosis not present

## 2023-09-22 DIAGNOSIS — N1831 Chronic kidney disease, stage 3a: Secondary | ICD-10-CM | POA: Diagnosis not present

## 2023-09-22 DIAGNOSIS — I129 Hypertensive chronic kidney disease with stage 1 through stage 4 chronic kidney disease, or unspecified chronic kidney disease: Secondary | ICD-10-CM | POA: Diagnosis not present

## 2023-09-23 DIAGNOSIS — D8685 Sarcoid myocarditis: Secondary | ICD-10-CM | POA: Diagnosis not present

## 2023-10-02 DIAGNOSIS — G20B1 Parkinson's disease with dyskinesia, without mention of fluctuations: Secondary | ICD-10-CM | POA: Diagnosis not present

## 2023-10-07 ENCOUNTER — Encounter (HOSPITAL_BASED_OUTPATIENT_CLINIC_OR_DEPARTMENT_OTHER)

## 2023-10-07 ENCOUNTER — Ambulatory Visit: Admitting: Pulmonary Disease

## 2023-10-07 DIAGNOSIS — D869 Sarcoidosis, unspecified: Secondary | ICD-10-CM

## 2023-10-07 DIAGNOSIS — J309 Allergic rhinitis, unspecified: Secondary | ICD-10-CM | POA: Insufficient documentation

## 2023-10-07 LAB — PULMONARY FUNCTION TEST
DL/VA % pred: 83 %
DL/VA: 3.63 ml/min/mmHg/L
DLCO cor % pred: 73 %
DLCO cor: 10.83 ml/min/mmHg
DLCO unc % pred: 73 %
DLCO unc: 10.83 ml/min/mmHg
FEF 25-75 Post: 1.41 L/s
FEF 25-75 Pre: 1.63 L/s
FEF2575-%Change-Post: -13 %
FEF2575-%Pred-Post: 111 %
FEF2575-%Pred-Pre: 128 %
FEV1-%Change-Post: -2 %
FEV1-%Pred-Post: 104 %
FEV1-%Pred-Pre: 107 %
FEV1-Post: 1.53 L
FEV1-Pre: 1.57 L
FEV1FVC-%Change-Post: 2 %
FEV1FVC-%Pred-Pre: 108 %
FEV6-%Change-Post: -5 %
FEV6-%Pred-Post: 98 %
FEV6-%Pred-Pre: 104 %
FEV6-Post: 1.83 L
FEV6-Pre: 1.94 L
FEV6FVC-%Pred-Post: 106 %
FEV6FVC-%Pred-Pre: 106 %
FVC-%Change-Post: -5 %
FVC-%Pred-Post: 93 %
FVC-%Pred-Pre: 98 %
FVC-Post: 1.84 L
FVC-Pre: 1.94 L
Post FEV1/FVC ratio: 83 %
Post FEV6/FVC ratio: 100 %
Pre FEV1/FVC ratio: 81 %
Pre FEV6/FVC Ratio: 100 %
RV % pred: 159 %
RV: 3.08 L
TLC % pred: 127 %
TLC: 5.07 L

## 2023-10-07 NOTE — Patient Instructions (Signed)
 Full PFT performed today.

## 2023-10-07 NOTE — Progress Notes (Signed)
Full PFT performed. 

## 2023-10-08 ENCOUNTER — Other Ambulatory Visit

## 2023-10-08 ENCOUNTER — Ambulatory Visit (INDEPENDENT_AMBULATORY_CARE_PROVIDER_SITE_OTHER)

## 2023-10-08 ENCOUNTER — Other Ambulatory Visit: Payer: Self-pay

## 2023-10-08 VITALS — BP 166/76 | HR 79 | Temp 97.3°F | Resp 14 | Ht <= 58 in | Wt 141.6 lb

## 2023-10-08 DIAGNOSIS — D869 Sarcoidosis, unspecified: Secondary | ICD-10-CM

## 2023-10-08 DIAGNOSIS — H44113 Panuveitis, bilateral: Secondary | ICD-10-CM

## 2023-10-08 DIAGNOSIS — Z79899 Other long term (current) drug therapy: Secondary | ICD-10-CM | POA: Diagnosis not present

## 2023-10-08 MED ORDER — ACETAMINOPHEN 325 MG PO TABS
650.0000 mg | ORAL_TABLET | Freq: Once | ORAL | Status: AC
  Administered 2023-10-08: 650 mg via ORAL
  Filled 2023-10-08: qty 2

## 2023-10-08 MED ORDER — METHYLPREDNISOLONE SODIUM SUCC 40 MG IJ SOLR
40.0000 mg | Freq: Once | INTRAMUSCULAR | Status: AC
  Administered 2023-10-08: 40 mg via INTRAVENOUS
  Filled 2023-10-08: qty 1

## 2023-10-08 MED ORDER — DIPHENHYDRAMINE HCL 25 MG PO CAPS
25.0000 mg | ORAL_CAPSULE | Freq: Once | ORAL | Status: AC
  Administered 2023-10-08: 25 mg via ORAL
  Filled 2023-10-08: qty 1

## 2023-10-08 MED ORDER — SODIUM CHLORIDE 0.9 % IV SOLN
3.0000 mg/kg | Freq: Once | INTRAVENOUS | Status: AC
  Administered 2023-10-08: 200 mg via INTRAVENOUS
  Filled 2023-10-08: qty 20

## 2023-10-08 NOTE — Progress Notes (Signed)
 Diagnosis: Sarcoidosis  Provider:  Lonna Coder MD  Procedure: IV Infusion  IV Type: Peripheral, IV Location: L Antecubital  Remicade  (Infliximab ), Dose: 200 mg  Infusion Start Time: 1325  Infusion Stop Time: 1545  Post Infusion IV Care: Peripheral IV Discontinued  Discharge: Condition: Good, Destination: Home . AVS Declined  Performed by:  Leita FORBES Miles, LPN

## 2023-10-09 ENCOUNTER — Encounter: Payer: Self-pay | Admitting: Internal Medicine

## 2023-10-09 ENCOUNTER — Ambulatory Visit: Admitting: Internal Medicine

## 2023-10-09 VITALS — BP 148/85 | HR 73 | Temp 97.5°F | Resp 17 | Ht <= 58 in | Wt 137.0 lb

## 2023-10-09 DIAGNOSIS — K2100 Gastro-esophageal reflux disease with esophagitis, without bleeding: Secondary | ICD-10-CM

## 2023-10-09 DIAGNOSIS — K219 Gastro-esophageal reflux disease without esophagitis: Secondary | ICD-10-CM | POA: Diagnosis not present

## 2023-10-09 DIAGNOSIS — Z860101 Personal history of adenomatous and serrated colon polyps: Secondary | ICD-10-CM | POA: Diagnosis not present

## 2023-10-09 DIAGNOSIS — K644 Residual hemorrhoidal skin tags: Secondary | ICD-10-CM

## 2023-10-09 DIAGNOSIS — F419 Anxiety disorder, unspecified: Secondary | ICD-10-CM | POA: Diagnosis not present

## 2023-10-09 DIAGNOSIS — K5730 Diverticulosis of large intestine without perforation or abscess without bleeding: Secondary | ICD-10-CM | POA: Diagnosis not present

## 2023-10-09 DIAGNOSIS — I10 Essential (primary) hypertension: Secondary | ICD-10-CM | POA: Diagnosis not present

## 2023-10-09 DIAGNOSIS — Z860100 Personal history of colon polyps, unspecified: Secondary | ICD-10-CM

## 2023-10-09 DIAGNOSIS — K648 Other hemorrhoids: Secondary | ICD-10-CM

## 2023-10-09 DIAGNOSIS — E785 Hyperlipidemia, unspecified: Secondary | ICD-10-CM | POA: Diagnosis not present

## 2023-10-09 DIAGNOSIS — G20A1 Parkinson's disease without dyskinesia, without mention of fluctuations: Secondary | ICD-10-CM | POA: Diagnosis not present

## 2023-10-09 DIAGNOSIS — K449 Diaphragmatic hernia without obstruction or gangrene: Secondary | ICD-10-CM

## 2023-10-09 DIAGNOSIS — R933 Abnormal findings on diagnostic imaging of other parts of digestive tract: Secondary | ICD-10-CM | POA: Diagnosis not present

## 2023-10-09 DIAGNOSIS — R948 Abnormal results of function studies of other organs and systems: Secondary | ICD-10-CM

## 2023-10-09 MED ORDER — PANTOPRAZOLE SODIUM 40 MG PO TBEC: 40.0000 mg | DELAYED_RELEASE_TABLET | Freq: Every day | ORAL | 11 refills | Status: AC

## 2023-10-09 MED ORDER — SODIUM CHLORIDE 0.9 % IV SOLN: 500.0000 mL | Freq: Once | INTRAVENOUS | Status: DC

## 2023-10-09 NOTE — Op Note (Signed)
 [facility] Patient Name: Destiny Roth  Bhargava Procedure Date: 10/09/2023 11:19 AM MRN: [id number] Endoscopist: Norleen SAILOR. Abran , MD, [id number] Age: 73 Referring MD:  Date of Birth: Jul 02, 1950 Gender: Female Account #: [id number] Procedure:                Upper GI endoscopy Indications:              Esophageal reflux, Abnormal PET scan of the GI                            tract (distal esophagus) Medicines:                Monitored Anesthesia Care Procedure:                Pre-Anesthesia Assessment:                           - Prior to the procedure, a History and Physical                            was performed, and patient medications and                            allergies were reviewed. The patient's tolerance of                            previous anesthesia was also reviewed. The risks                            and benefits of the procedure and the sedation                            options and risks were discussed with the patient.                            All questions were answered, and informed consent                            was obtained. Prior Anticoagulants: The patient has                            taken no anticoagulant or antiplatelet agents. ASA                            Grade Assessment: III - A patient with severe                            systemic disease. After reviewing the risks and                            benefits, the patient was deemed in satisfactory                            condition to undergo the procedure.  After obtaining informed consent, the endoscope was                            passed under direct vision. Throughout the                            procedure, the patient's blood pressure, pulse, and                            oxygen  saturations were monitored continuously. The                            GIF [id number] #[id number] was introduced through the                            mouth, and advanced to the  second part of duodenum.                            The upper GI endoscopy was accomplished without                            difficulty. The patient tolerated the procedure                            well. Scope In: Scope Out: Findings:                 The esophagus revealed mild reflux esophagitis as                            manifested by edema and erythema at the                            gastroesophageal junction.                           The stomach was normal, save small hiatal hernia.                           The examined duodenum was normal. Normal major                            papilla                           The cardia and gastric fundus were normal on                            retroflexion. Complications:            No immediate complications. Estimated Blood Loss:     Estimated blood loss: none. Impression:               1. GERD with reflux esophagitis                           2. Otherwise unremarkable EGD. Recommendation:           -  Patient has a contact number available for                            emergencies. The signs and symptoms of potential                            delayed complications were discussed with the                            patient. Return to normal activities tomorrow.                            Written discharge instructions were provided to the                            patient.                           - Resume previous diet.                           - Continue present medications.                           - Prescribe pantoprazole  40 mg daily; #30; 11                            refills. Take 1 daily. This will control your acid                            reflux symptoms and he will esophageal information. Norleen SAILOR. Abran, MD 10/09/2023 11:57:42 AM This report has been signed electronically.

## 2023-10-09 NOTE — Patient Instructions (Signed)

## 2023-10-09 NOTE — Progress Notes (Signed)
 Expand All Collapse All HISTORY OF PRESENT ILLNESS:   Destiny Roth is a 73 y.o. female with multiple medical problems as listed below.  She is sent today by her pulmonologist regarding abnormal PET scan findings involving the esophagus and right colon.  She is accompanied by her husband.   I last saw the patient Jun 15, 2019 when she underwent routine screening colonoscopy.  She was found to have a diminutive adenoma as well as diverticulosis involving the right colon and left colon.  Incidental internal hemorrhoids.  Normal terminal ileum.  Previous colonoscopy in 2008 was negative for neoplasia.  She also had upper endoscopy elsewhere in 2008 with a normal esophagus and stomach.   Patient does have periodic reflux symptoms for which she takes Tums.  No dysphagia.  No weight loss.   Bowel habits are unchanged.  She oscillates between constipation and diarrhea.  No bleeding.   PET scan reviewed   Laboratories from July 23, 2023 showed creatinine of 1.22 and hemoglobin 14.4   REVIEW OF SYSTEMS:   All non-GI ROS negative unless otherwise stated in the HPI except for back pain, visual change, confusion, cough, depression, fatigue, hearing problems, itching, shortness of breath, skin rash, lower extremity swelling, urinary leakage, voice change       Past Medical History:  Diagnosis Date   Abdominal pain, epigastric 09/18/2009   Anosmia 11/06/2010   ANXIETY 09/23/2006   BUNION, RIGHT FOOT 09/18/2009   CARPAL TUNNEL SYNDROME, BILATERAL 09/23/2006   DEPRESSION 09/23/2006   DIVERTICULOSIS, COLON 09/23/2006   GERD 05/24/2008   GLUCOSE INTOLERANCE 05/24/2008   Hemorrhoids     Hypercalcemia     HYPERLIPIDEMIA 09/23/2006   HYPERTENSION 09/23/2006   IBS 09/23/2006   Impaired glucose tolerance 11/04/2010   OSTEOPOROSIS 05/24/2008    06/01/19-pt states osteopenia   Parkinson's disease (HCC)     Restrictive lung disease 12/20/2013   Sarcoidosis 02/26/2019               Past Surgical History:   Procedure Laterality Date   ABDOMINAL EXPOSURE N/A 10/29/2021    Procedure: LATERAL EXPOSURE FOR OBLIQUE SPINE SURGERY;  Surgeon: Gretta Lonni PARAS, MD;  Location: [facility] OR;  Service: Vascular;  Laterality: N/A;   CARDIAC CATHETERIZATION       CATARACT EXTRACTION       COLONOSCOPY   2008   COLONOSCOPY W/ BIOPSIES   2008    Dr. Kristie -negative random bxs   ESOPHAGOGASTRODUODENOSCOPY   2008    Dr. Isaiah duodenal ulcer   FOOT SURGERY Bilateral 2015   LEFT AND RIGHT HEART CATHETERIZATION WITH CORONARY ANGIOGRAM N/A 11/09/2013    Procedure: LEFT AND RIGHT HEART CATHETERIZATION WITH CORONARY ANGIOGRAM;  Surgeon: Peter M Swaziland, MD;  Location: [facility] CATH LAB;  Service: Cardiovascular;  Laterality: N/A;   nasal septum repair       OBLIQUE LUMBAR INTERBODY FUSION 1 LEVEL WITH PERCUTANEOUS SCREWS N/A 10/29/2021    Procedure: OBLIQUE LUMBAR INTERBODY FUSION WITH SUPPLEMENTAL PEDICLE SCREW FIXATION LUMBAR FOUR TO FIVE;  Surgeon: Burnetta Aures, MD;  Location: [facility] OR;  Service: Orthopedics;  Laterality: N/A;   s/p bilat CTS       s/p fibroid ablation       s/p ganglion cyst right wrist       s/p right thumb trigger finger surgury       SHOULDER SURGERY Right 2015   TUBAL LIGATION       UPPER GASTROINTESTINAL ENDOSCOPY  Social History Destiny  A Roth  reports that she has never smoked. She has been exposed to tobacco smoke. She has never used smokeless tobacco. She reports that she does not currently use alcohol. She reports that she does not use drugs.   family history includes Heart attack in her brother and mother; Heart disease (age of onset: 54) in her father; Hypertension in her brother, brother, brother, and father; Peripheral vascular disease in her mother.   Allergies       Allergies  Allergen Reactions   Myrbetriq  [Mirabegron ] Other (See Comments)      SEVERE muscle and joint pain   Atorvastatin  Other (See Comments)      Muscle pain Muscle pain myalgia   Zocor  [Simvastatin] Other (See Comments)      Muscle pain            PHYSICAL EXAMINATION: Vital signs: BP 130/80   Pulse 89   Ht 4' 9.5 (1.461 m)   Wt 137 lb (62.1 kg)   BMI 29.13 kg/m   Constitutional: generally well-appearing, no acute distress.  Parkinsonian facies Psychiatric: alert and oriented x3, cooperative Eyes: extraocular movements intact, anicteric, conjunctiva pink Mouth: oral pharynx moist, no lesions Neck: supple no lymphadenopathy Cardiovascular: heart regular rate and rhythm, no murmur Lungs: clear to auscultation bilaterally Abdomen: soft, nontender, nondistended, no obvious ascites, no peritoneal signs, normal bowel sounds, no organomegaly Rectal: Deferred till colonoscopy Extremities: no clubbing, cyanosis, or lower extremity edema bilaterally Skin: no lesions on visible extremities Neuro: No focal deficits.  Cranial nerves intact   ASSESSMENT:   1.  GERD 2.  Abnormal PET scan of the distal esophagus.  Suspect reflux esophagitis.  Rule out neoplasia 3.  History of diminutive adenoma and pandiverticulosis 4.  Abnormal PET scan in the proximal ascending colon.  Possibly diverticular inflammation.  Rule out neoplasia 5.  Multiple medical problems including sarcoidosis (in check) and Parkinson's disease   PLAN:   1.  Schedule upper endoscopy to evaluate abnormal PET scan.  Patient is higher than average risk due to her age and comorbidities.The nature of the procedure, as well as the risks, benefits, and alternatives were carefully and thoroughly reviewed with the patient. Ample time for discussion and questions allowed. The patient understood, was satisfied, and agreed to proceed. 2.  Schedule colonoscopy to evaluate abnormal PET scan.  Patient is high risk as above.The nature of the procedure, as well as the risks, benefits, and alternatives were carefully and thoroughly reviewed with the patient. Ample time for discussion and questions allowed. The patient  understood, was satisfied, and agreed to proceed. 3.  Reflux precautions 4.  Okay to take PPI for frequent GERD symptoms      Recent complete GI H&P as above.  No interval change.  Now for colonoscopy and upper endoscopy

## 2023-10-09 NOTE — Op Note (Signed)
 [facility] Patient Name: Destiny Roth  Nephew Procedure Date: 10/09/2023 11:20 AM MRN: [id number] Endoscopist: Norleen SAILOR. Abran , MD, [id number] Age: 73 Referring MD:  Date of Birth: July 04, 1950 Gender: Female Account #: [id number] Procedure:                Colonoscopy Indications:              Abnormal PET scan of the GI tract (abnormal tracer                            activity proximal right colon); previous                            colonoscopy 2008 was negative for neoplasia. 2021                            with small adenoma. Medicines:                Monitored Anesthesia Care Procedure:                Pre-Anesthesia Assessment:                           - Prior to the procedure, a History and Physical                            was performed, and patient medications and                            allergies were reviewed. The patient's tolerance of                            previous anesthesia was also reviewed. The risks                            and benefits of the procedure and the sedation                            options and risks were discussed with the patient.                            All questions were answered, and informed consent                            was obtained. Prior Anticoagulants: The patient has                            taken no anticoagulant or antiplatelet agents. ASA                            Grade Assessment: III - A patient with severe                            systemic disease. After reviewing the risks and  benefits, the patient was deemed in satisfactory                            condition to undergo the procedure.                           After obtaining informed consent, the colonoscope                            was passed under direct vision. Throughout the                            procedure, the patient's blood pressure, pulse, and                            oxygen  saturations were monitored  continuously. The                            Olympus Scope SN [id number] was introduced through the                            anus and advanced to the the cecum, identified by                            appendiceal orifice and ileocecal valve. The                            terminal ileum, ileocecal valve, appendiceal                            orifice, and rectum were photographed. The quality                            of the bowel preparation was excellent. The                            colonoscopy was performed without difficulty. The                            patient tolerated the procedure well. The bowel                            preparation used was SUTAB via split dose                            instruction. Scope In: 11:23:56 AM Scope Out: 11:37:34 AM Scope Withdrawal Time: 0 hours 8 minutes 51 seconds  Total Procedure Duration: 0 hours 13 minutes 38 seconds  Findings:                 The terminal ileum appeared normal.                           Multiple diverticula were found in the left colon  and right colon.                           External and internal hemorrhoids were found during                            retroflexion. The hemorrhoids were moderate.                           The exam was otherwise without abnormality on                            direct and retroflexion views. No significant                            abnormality of right colon. Complications:            No immediate complications. Estimated blood loss:                            None. Estimated Blood Loss:     Estimated blood loss: none. Impression:               - The examined portion of the ileum was normal.                           - Diverticulosis in the left colon and in the right                            colon.                           - External and internal hemorrhoids.                           - The examination was otherwise normal on direct                             and retroflexion views.                           - No specimens collected. Recommendation:           - Repeat colonoscopy is not recommended for                            surveillance.                           - Patient has a contact number available for                            emergencies. The signs and symptoms of potential                            delayed complications were discussed with the  patient. Return to normal activities tomorrow.                            Written discharge instructions were provided to the                            patient.                           - Resume previous diet.                           - Continue present medications. Norleen SAILOR. Abran, MD 10/09/2023 11:45:48 AM This report has been signed electronically.

## 2023-10-09 NOTE — Progress Notes (Signed)
 Sedate, gd SR, tolerated procedure well, VSS, report to RN

## 2023-10-10 ENCOUNTER — Telehealth: Payer: Self-pay

## 2023-10-10 NOTE — Telephone Encounter (Signed)
 Left message

## 2023-10-14 DIAGNOSIS — D8685 Sarcoid myocarditis: Secondary | ICD-10-CM | POA: Diagnosis not present

## 2023-10-15 DIAGNOSIS — H3589 Other specified retinal disorders: Secondary | ICD-10-CM | POA: Diagnosis not present

## 2023-10-15 LAB — CBC WITH DIFFERENTIAL/PLATELET
Absolute Lymphocytes: 644 {cells}/uL — ABNORMAL LOW (ref 850–3900)
Absolute Monocytes: 432 {cells}/uL (ref 200–950)
Basophils Absolute: 29 {cells}/uL (ref 0–200)
Basophils Relative: 0.8 %
Eosinophils Absolute: 0 {cells}/uL — ABNORMAL LOW (ref 15–500)
Eosinophils Relative: 0 %
HCT: 41.1 % (ref 35.0–45.0)
Hemoglobin: 13.8 g/dL (ref 11.7–15.5)
MCH: 30.9 pg (ref 27.0–33.0)
MCHC: 33.6 g/dL (ref 32.0–36.0)
MCV: 92.2 fL (ref 80.0–100.0)
MPV: 9.8 fL (ref 7.5–12.5)
Monocytes Relative: 12 %
Neutro Abs: 2495 {cells}/uL (ref 1500–7800)
Neutrophils Relative %: 69.3 %
Platelets: 210 Thousand/uL (ref 140–400)
RBC: 4.46 Million/uL (ref 3.80–5.10)
RDW: 12.6 % (ref 11.0–15.0)
Total Lymphocyte: 17.9 %
WBC: 3.6 Thousand/uL — ABNORMAL LOW (ref 3.8–10.8)

## 2023-10-15 LAB — COMPREHENSIVE METABOLIC PANEL WITH GFR
AG Ratio: 0.9 (calc) — ABNORMAL LOW (ref 1.0–2.5)
ALT: 11 U/L (ref 6–29)
AST: 40 U/L — ABNORMAL HIGH (ref 10–35)
Albumin: 3.8 g/dL (ref 3.6–5.1)
Alkaline phosphatase (APISO): 63 U/L (ref 37–153)
BUN/Creatinine Ratio: 18 (calc) (ref 6–22)
BUN: 23 mg/dL (ref 7–25)
CO2: 26 mmol/L (ref 20–32)
Calcium: 9.5 mg/dL (ref 8.6–10.4)
Chloride: 103 mmol/L (ref 98–110)
Creat: 1.27 mg/dL — ABNORMAL HIGH (ref 0.60–1.00)
Globulin: 4.3 g/dL — ABNORMAL HIGH (ref 1.9–3.7)
Glucose, Bld: 101 mg/dL — ABNORMAL HIGH (ref 65–99)
Potassium: 5.2 mmol/L (ref 3.5–5.3)
Sodium: 137 mmol/L (ref 135–146)
Total Bilirubin: 0.5 mg/dL (ref 0.2–1.2)
Total Protein: 8.1 g/dL (ref 6.1–8.1)
eGFR: 44 mL/min/1.73m2 — ABNORMAL LOW (ref >=60)

## 2023-10-15 LAB — QUANTIFERON-TB GOLD PLUS

## 2023-10-17 ENCOUNTER — Ambulatory Visit: Payer: Self-pay | Admitting: Pulmonary Disease

## 2023-10-17 NOTE — Telephone Encounter (Signed)
**Note De-identified  [place] Obfuscation** Please advise 

## 2023-10-23 DIAGNOSIS — D869 Sarcoidosis, unspecified: Secondary | ICD-10-CM | POA: Diagnosis not present

## 2023-10-23 DIAGNOSIS — H532 Diplopia: Secondary | ICD-10-CM | POA: Diagnosis not present

## 2023-10-23 DIAGNOSIS — G20A1 Parkinson's disease without dyskinesia, without mention of fluctuations: Secondary | ICD-10-CM | POA: Diagnosis not present

## 2023-10-23 DIAGNOSIS — H4720 Unspecified optic atrophy: Secondary | ICD-10-CM | POA: Diagnosis not present

## 2023-10-23 DIAGNOSIS — H53453 Other localized visual field defect, bilateral: Secondary | ICD-10-CM | POA: Diagnosis not present

## 2023-10-27 ENCOUNTER — Encounter (HOSPITAL_BASED_OUTPATIENT_CLINIC_OR_DEPARTMENT_OTHER): Payer: Self-pay | Admitting: Pulmonary Disease

## 2023-10-27 ENCOUNTER — Ambulatory Visit (INDEPENDENT_AMBULATORY_CARE_PROVIDER_SITE_OTHER): Admitting: Pulmonary Disease

## 2023-10-27 VITALS — BP 124/82 | HR 96 | Ht <= 58 in | Wt 139.6 lb

## 2023-10-27 DIAGNOSIS — D869 Sarcoidosis, unspecified: Secondary | ICD-10-CM

## 2023-10-27 DIAGNOSIS — N189 Chronic kidney disease, unspecified: Secondary | ICD-10-CM | POA: Diagnosis not present

## 2023-10-27 DIAGNOSIS — D860 Sarcoidosis of lung: Secondary | ICD-10-CM

## 2023-10-27 DIAGNOSIS — Z79899 Other long term (current) drug therapy: Secondary | ICD-10-CM

## 2023-10-27 DIAGNOSIS — R21 Rash and other nonspecific skin eruption: Secondary | ICD-10-CM | POA: Diagnosis not present

## 2023-10-27 DIAGNOSIS — D8689 Sarcoidosis of other sites: Secondary | ICD-10-CM

## 2023-10-27 DIAGNOSIS — G20A1 Parkinson's disease without dyskinesia, without mention of fluctuations: Secondary | ICD-10-CM

## 2023-10-27 NOTE — Patient Instructions (Addendum)
 Sarcoid --Continue Infliximab . Started 06/18/23  Rash: Possible hypersensitivity reaction --Recommend benadryl  12.5 mg nightly for one week after infusion

## 2023-10-27 NOTE — Progress Notes (Signed)
 Subjective:   PATIENT ID: Destiny Roth  A Lenon BILIS: female DOB: 08/21/1950, MRN: [id number]   HPI  Chief Complaint  Patient presents with   Follow-up    Sarcoidosis    Reason for Visit: Follow-up  Mrs. Destiny  Roth is a 73 year old with Parkinson's disease, HTN, HLD and restrictive lung disease who presents for follow-up for sarcoid management.  Synopsis:  Initially seen as an inpatient Pulmonary consult by me on 02/23/19 for hypercalcemia. Bone biopsy was performed and demonstrated granulomas disease without necrosis. She was started on steroids and discharged with Endocrine and Pulmonary follow-up. Of note, she was previously seen in our Pulmonary clinic in 2018 for dyspnea and mild sleep apnea. Her prior PFTs demonstrate mild restrictive defect with normal DLCO which patient stated was attributed to her Parkinson's disease.  2021 - She is currently on tacrolimus 1 mg BID, methotrexate  and prednisone . She continues to have fatigue and visual blurriness. Her dyspnea also remains unchanged. Worsens with exertion. Tacrolimus was discontinued by ophthalmology due tremors. Started on CellCept  in November 2021.  2022 - Weaned off methotrexate . She still continues to have issues with fatigue, tremors and word finding after switching to Cellcept . When seen by her neurologist, she reports that her Parkinson's is stable. She was referred to Dr. Charmel to evaluate for neuro involvement of her sarcoid. Ophthalmology appt 03/14/20 ocular exam shows stable inflammation. Her ophthalmology note from 05/16/20 she has requested stopping taking immunosuppressants to see if these are the cause of her symptoms of fatigue, memory and tremor issues. Her fatigue and tremors have improved off the cellcept  however it remains persistent. Unclear if her memory is better off cellcept . Started on Humira  on 07/11/20. Started on Amantadine  for progressive dyskinesia related to her Parkinsons. She is unsure if the  inhaler helps but she is compliant with Incruse daily. She was seen by Neurology and gabapentin increased for newly diagnosed small fiber neuropathy. She reports that her vision is still distorted and occurs in the day but worsens at night.  2023 - Ophthalmology noted by Dr. Maree on 01/16/21 reviewed with no signs of active inflammation on eye exam. She reports that she is struggling with her lower extremity neuropathy which worsens her balance. She has been compliant with methotrexate  and Humira . She underwent spine surgery (C and L) in October and held her immunosuppression temporarily. She is having some hair loss that started with methotrexate  use. She reports her vision is unchanged. Having balance issues but thought to be due to Parkinsons. 2024 - Parkinson's has progressed and needing more support with dizziness. Discontinued Incruse due to cankar sores. Weaned off methotrexate  in March due to concern for side effects, however unable to differentiate between chronic med issues vs side effects of med. Remained on Humira  for this year. [facility] with Dr. Maree - no active inflammation on 11/12/22 exam.  2025 - Continuing to have trouble with dizziness and gait. For her sarcoid she transitioned from Humira  to Infliximab  03/12/23.   10/27/23 Since our last visit she had PET scan which was negative for sarcoid but abnormality in esophagus and colon. Underwent EGD on 10/09/23 and found with GERD with reflux esophagitis. Colonscopy normal. She continues to be on infliximab  which she is tolerating it. But has noticed a recent rash on her arm that improves with benadryl . Continues to have dizziness and gait issues that she attributes to her Parkinson's.  Social History: Never smoker Previously Merchandiser, retail for admission at [employer]   Past Medical History:  Diagnosis Date   Abdominal pain, epigastric 09/18/2009   Anosmia 11/06/2010   ANXIETY 09/23/2006   Bunion    Left foot   BUNION, RIGHT FOOT 09/18/2009    CARPAL TUNNEL SYNDROME, BILATERAL 09/23/2006   DEPRESSION 09/23/2006   DIVERTICULOSIS, COLON 09/23/2006   GERD 05/24/2008   GLUCOSE INTOLERANCE 05/24/2008   Hemorrhoids    Hypercalcemia    HYPERLIPIDEMIA 09/23/2006   HYPERTENSION 09/23/2006   IBS 09/23/2006   Impaired glucose tolerance 11/04/2010   Kidney disease 2021   Osteopenia 2015   OSTEOPOROSIS 05/24/2008   06/01/19-pt states osteopenia   Parkinson's disease (HCC)    Restrictive lung disease 12/20/2013   Sarcoidosis 02/26/2019      Outpatient Medications Prior to Visit  Medication Sig Dispense Refill   acetaminophen  (TYLENOL ) 500 MG tablet Take 500 mg by mouth every 6 (six) hours as needed for mild pain.     albuterol  (VENTOLIN  HFA) 108 (90 Base) MCG/ACT inhaler Inhale 2 puffs into the lungs every 6 (six) hours as needed for wheezing or shortness of breath. 8 g 2   Amantadine  HCl 100 MG tablet Take 100 mg by mouth in the morning, at noon, and at bedtime.     aspirin  EC 81 MG tablet Take 81 mg by mouth daily.     Biotin  1 MG CAPS Take 1 mg by mouth daily at 2 PM. 30 capsule 5   carbidopa -levodopa  (SINEMET  IR) 25-100 MG tablet Take by mouth.     Carbidopa -Levodopa  ER (SINEMET  CR) 25-100 MG tablet controlled release Take 1 tablet by mouth 2 (two) times daily.     fesoterodine (TOVIAZ) 4 MG TB24 tablet Take 4 mg by mouth daily.     gabapentin (NEURONTIN) 300 MG capsule Take 300 mg by mouth 3 (three) times daily.     immune globulin, human, (GAMMAGARD S/D LESS IGA) 10 g injection Inject into the vein.     inFLIXimab  (REMICADE  IV) Inject into the vein. Infuse 3 mg/kg at weeks 0, 2, and 6 then 3 mg/kg every 8 weeks thereater ([facility])     memantine (NAMENDA) 10 MG tablet Take 10 mg by mouth 2 (two) times daily.     pantoprazole  (PROTONIX ) 40 MG tablet Take 1 tablet (40 mg total) by mouth daily. 30 tablet 11   RESTASIS MULTIDOSE 0.05 % ophthalmic emulsion 1 drop 2 (two) times daily.     rosuvastatin  (CRESTOR ) 20  MG tablet Take 1 tablet (20 mg total) by mouth daily. 90 tablet 3   sertraline  (ZOLOFT ) 50 MG tablet Take 50 mg by mouth daily.     memantine (NAMENDA) 5 MG tablet Take 10 mg by mouth 2 (two) times daily. Patient has not started it yet as she wants to know more about it (Patient not taking: Reported on 10/09/2023)     rOPINIRole  (REQUIP ) 1 MG tablet Take 1 tablet (1 mg total) by mouth 3 (three) times daily. (Patient taking differently: Take 1 mg by mouth in the morning and at bedtime.) 90 tablet 0   senna (SENOKOT) 8.6 MG TABS tablet Take 1 tablet (8.6 mg total) by mouth 2 (two) times daily. (Patient not taking: No sig reported) 30 tablet 0   sertraline  (ZOLOFT ) 100 MG tablet Take 1 tablet by mouth daily. (Patient not taking: Reported on 10/09/2023)     Triamcinolone  Acetonide 0.025 % LOTN 1 Application. (Patient not taking: Reported on 10/09/2023)     No facility-administered medications prior to visit.    Review  of Systems  Constitutional:  Positive for malaise/fatigue. Negative for chills, diaphoresis, fever and weight loss.  HENT:  Negative for congestion.   Respiratory:  Negative for cough, hemoptysis, sputum production, shortness of breath and wheezing.   Cardiovascular:  Negative for chest pain, palpitations and leg swelling.  Skin:  Positive for rash.  Neurological:  Positive for dizziness.   Objective:   Vitals:   10/27/23 1502  BP: 124/82  Pulse: 96  SpO2: 98%  Weight: 139 lb 9.6 oz (63.3 kg)  Height: 4' 9.5 (1.461 m)     SpO2: 98 %  Physical Exam: General: Well-appearing, no acute distress HENT: , AT Eyes: EOMI, no scleral icterus Respiratory: Clear to auscultation bilaterally.  No crackles, wheezing or rales Cardiovascular: RRR, -M/R/G, no JVD Extremities:-Edema,-tenderness, no rash present Neuro: AAO x4, CNII-XII grossly intact Psych: Normal mood, normal affect    Data Reviewed:  Imaging: CXR 02/23/19 - interval resolutions of bilateral pleural  effusions CT Chest 02/09/19 - Bilateral pleural effusions. Mild ground glass opacifications bilaterally. No enlarged mediastinal or hilar adenopathy. PET Myocardial 01/07/20 - No evidence of increased uptake PET/CT 01/07/20 1. Bilateral ground-glass pulmonary opacities demonstrate mild-moderate FDG avidity. These findings are nonspecific and may be infectious or inflammatory.2. Mild FDG activity of non-enlarged bilateral hilar lymph nodes is also nonspecific.  This could be reactive in the setting of the pulmonary findings, but activity related to sarcoid cannot be excluded.  CXR 03/30/20 - No infiltrate, effusion or edema.  PET Myocardial 03/09/21 - Persistent FDG avid bilateral hilar lymphadenopathy. New FDG avid prevascular lymph node. Increased bilateral FDG avid groundglass pulmonary opacities. These remain nonspecific and are again favored to be infectious/ inflammatory etiology. No evidence of active cardiac sarcoidosis.  CT Cardiac 07/24/22 - Visualized lung parenchyma with no pulmonary nodules, masses, infiltrate, effusion or pneumothorax. PET/CT 04/22/23 - No evidence of sarcoid. Hypermetabolism in proximal ascending colon and focal mild hypermetabolism in the distal esophagus  PFT: 12/17/13 FVC 2.26 (86%) FEV1 1.88 (93%) Ratio 78  TLC 70% DLCO 75% Interpretation: Mild restrictive defect with mild reduction in gas exchange (uncorrected DLCO)  06/22/19 FVC 2.07 (95%) FEV1 1.75 (107%) Ratio 80  TLC 91% DLCO 63% Interpretation: No obstructive or restrictive defect. Mild reduction in gas exchange   06/10/20 FVC 2.11 (99%) FEV1 1.8 (113%) Ratio 82  TLC 132% RV 162% RV/TLC 130% DLCO 94% Interpretation: Normal spirometry and gas exchange. Compared to prior PFTs, patient has had increased lung volumes suggestive of air trapping. No significant bronchodilator response however does not preclude benefit. Clinically correlate  09/12/20 FVC 2.17 (102%) FEV1 1.87 (117%) Ratio 86  TLC 105% DLCO  88% Interpretation: Normal PFTs. Air trapping has resolved.  04/24/22 FVC 2.06 (100%) FEV1 1.77 (116%) Ratio 86  TLC 99% DLCO 90% Interpretation: Normal PFTs  11/04/23 FVC 1.84 (93%) FEV1 1.53 (104%) Ratio 83  TLC 127% DLCO 73% Interpretation: No obstructive or restrictive defect. Hyperinflation. Mildly reduced gas exchange   Echocardiogram: 04/22/19 - EF 60-65%. Grade I DD. No WMA or valvular abnormalities.  CBC    Latest Ref Rng & Units 10/08/2023   12:55 PM 07/23/2023    3:18 PM 06/09/2023   11:27 AM  CBC  WBC 3.8 - 10.8 Thousand/uL 3.6  5.4  10.1   Hemoglobin 11.7 - 15.5 g/dL 86.1  85.5  84.6   Hematocrit 35.0 - 45.0 % 41.1  42.6  46.6   Platelets 140 - 400 Thousand/uL 210  301.0  337   Mild leukopenia. Continue  to monitor     Latest Ref Rng & Units 10/08/2023   12:55 PM 07/23/2023    3:18 PM 06/09/2023   11:27 AM  BMP  Glucose 65 - 99 mg/dL 898  869  860   BUN 7 - 25 mg/dL 23  25  23    Creatinine 0.60 - 1.00 mg/dL 8.72  8.67  8.54   BUN/Creat Ratio 6 - 22 (calc) 18     Sodium 135 - 146 mmol/L 137  138  141   Potassium 3.5 - 5.3 mmol/L 5.2  4.1  4.3   Chloride 98 - 110 mmol/L 103  102  105   CO2 20 - 32 mmol/L 26  26  24    Calcium  8.6 - 10.4 mg/dL 9.5  9.9  89.8   Improved Cr Calcium  wnl     Latest Ref Rng & Units 10/08/2023   12:55 PM 06/09/2023   11:27 AM 04/23/2023   11:01 AM  Hepatic Function  Total Protein 6.1 - 8.1 g/dL 8.1  8.0  7.0   Albumin 3.5 - 5.0 g/dL  4.1    AST 10 - 35 U/L 40  34  38   ALT 6 - 29 U/L 11  7  27    Alk Phosphatase 38 - 126 U/L  66    Total Bilirubin 0.2 - 1.2 mg/dL 0.5  0.8  0.4   Mildly elevated AST Continue to monitor     Latest Ref Rng & Units 10/27/2023    4:08 PM  Quantiferon TB Gold  Quantiferon TB Gold Plus Negative Negative     Assessment & Plan:   Discussion: 73 year old female with sarcoid, Parkinson's disease who presents for sarcoid flare follow-up. Weaned off methotrexate  March 2024. Transitioned from Humira  to  Infliximab  03/12/23. Tolerating infliximab  except for mild rash. Will monitor response on Infliximab  in 3-6 months.  We discussed the clinical course of sarcoid and management including serial PFTs, labs, eye exam, and EKG and chest imaging if indicated. Addressed questions and concerns and reviewed immunosuppression plan below:  Sarcoid with pulm, ocular, renal, bone and small fiber neuropathy involvement --Dx 02/26/19 vis [facility] biopsy [facility] --Stable PFTs. Last completed 04/24/22 with normal PFTs --Echo reviewed from 03/2019 with grade I DD. EKG 11/01/21 overall normal. No prolonged PR interval --Sarcoid PET 12/2019 - negative for cardiac involvement. Suggests pulmonary involvement --Sarcoid PET 03/09/21 - active pulmonary involvement. Neg cardiac involvement.  --Consider repeat PET 12/2023  High risk medication management --Transitioned from Tacrolimus to Cellcept  11/2019 --Completed prednisone  taper 03/2020 --Started methotrexate  04/2020 --Started cellcept  07/2020. Methotrexate  concurrently weaned d/t intolerance --Prednisone  taper started with cellcept  discontinuation d/t intolerance 04/2020 --Immunosuppressant holiday  --Started Humira  on 07/11/20 for persistent ocular symptoms --S/p prednisone  taper from Dec 2022 to Jan 2023 --Readded methotrexate  02/2021-03/2022 --Off Humira  and transitioned to Infliximab  03/2023 --Eye exam with no inflammation on 04/30/22 and 11/12/22 and 10/23/23. Previously not quiescent on 11/2021 visit.  --Transitioned from Humira  to Infliximab . Started 06/18/23 --Will need labs with each infusion: CBC, CMET  Rash: Possible hypersensitivity reaction --Recommend benadryl  12.5 mg nightly for one week after infusion  Ocular sarcoid: posterior uveitis, optic nerve edema and peripheral focal chorioretinal inflammation --Followed by [facility] - no active inflammation on 11/12/22 exam  --Immunosuppressants as above --Reviewed Neuro notes 07/30/22 - visual distortions may be  related to Parkinson's or medication induced. Reducing Requip  --Remains on Humira  maintenance dosing --F/u Ophthalmology [facility]  Shortness of breath secondary to sarcoid, deconditioning  --  Normal PFTs on 04/24/22 --Discontinued Incruse due to canker sores --Reviewed PFTs. 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 Norine. Will CC notes --Will need to resume monthly labs if restarted on Cellcept  --Trend labs as above  Hypercalcemia secondary to sarcoid - Recent labs with normalization --Serial monitoring --Management as above  Small fiber neuropathy secondary to sarcoid Parkinson's disease  --MRI Brain 03/2019 neg for neurosarcoid --Spine MRI 02/2020 neg for spinal sarcoid  --Followed by [facility]: Dr. Brad Boards for Parkinson's  --Followed [facility] Dr. Prudencio at [facility]. On gabapentin --On IVIG per Neurology  Parkinson's Hallucinations, difficulty concentrating, confusion  May be related to Parkinsons. Serum calcium  levels normal --Followed by [facility] --Continue Carbidopa /levodopa  --Continue Namenda  Health Maintenance Immunization History  Administered Date(s) Administered   Fluzone Influenza virus vaccine,trivalent (IIV3), split virus 12/06/2019   INFLUENZA, HIGH DOSE SEASONAL PF 11/30/2015, 11/16/2016, 10/29/2017, 10/30/2018, 12/09/2018, 10/31/2020   Influenza, Seasonal, Injecte, Preservative Fre 10/20/2012   Influenza,inj,Quad PF,6+ Mos 11/18/2013   Influenza-Unspecified 11/18/2013, 11/30/2014, 10/29/2017, 10/29/2018   Moderna Sars-Covid-2 Vaccination 03/12/2019, 04/09/2019, 12/06/2019, 05/18/2020   Pfizer Covid-19 Vaccine Bivalent Booster 46yrs & up 06/05/2021   Pneumococcal Conjugate-13 01/01/2013,  01/05/2014   Pneumococcal Polysaccharide-23 01/01/2013, 01/05/2014, 02/25/2020   Tdap 11/06/2010, 05/01/2021   Zoster Recombinant(Shingrix) 01/10/2018, 03/31/2018   Zoster, Live 11/06/2010, 01/15/2018, 03/20/2018   CT Lung Screen - not qualified  Orders Placed This Encounter  Procedures   QuantiFERON-TB Gold Plus   No orders of the defined types were placed in this encounter.  Return in about 2 months (around 12/27/2023).   I have spent a total time of 36-minutes on the day of the appointment including chart review, data review, collecting history, coordinating care and discussing medical diagnosis and plan with the patient/family. Past medical history, allergies, medications were reviewed. Pertinent imaging, labs and tests included in this note have been reviewed and interpreted independently by me.  Staysha Truby Slater Staff, MD [facility] 10/27/2023 3:51 PM

## 2023-10-30 ENCOUNTER — Ambulatory Visit: Payer: Self-pay | Admitting: Pulmonary Disease

## 2023-10-30 LAB — QUANTIFERON-TB GOLD PLUS
QuantiFERON Mitogen Value: >10 [IU]/mL
QuantiFERON Nil Value: 0.06 [IU]/mL
QuantiFERON TB1 Ag Value: 0.07 [IU]/mL
QuantiFERON TB2 Ag Value: 0.06 [IU]/mL
QuantiFERON-TB Gold Plus: NEGATIVE

## 2023-11-04 ENCOUNTER — Encounter (HOSPITAL_BASED_OUTPATIENT_CLINIC_OR_DEPARTMENT_OTHER): Payer: Self-pay | Admitting: Pulmonary Disease

## 2023-11-12 DIAGNOSIS — D869 Sarcoidosis, unspecified: Secondary | ICD-10-CM | POA: Diagnosis not present

## 2023-11-12 DIAGNOSIS — L309 Dermatitis, unspecified: Secondary | ICD-10-CM | POA: Diagnosis not present

## 2023-11-12 DIAGNOSIS — I10 Essential (primary) hypertension: Secondary | ICD-10-CM | POA: Diagnosis not present

## 2023-11-12 DIAGNOSIS — N1832 Chronic kidney disease, stage 3b: Secondary | ICD-10-CM | POA: Diagnosis not present

## 2023-11-12 DIAGNOSIS — G63 Polyneuropathy in diseases classified elsewhere: Secondary | ICD-10-CM | POA: Diagnosis not present

## 2023-11-14 ENCOUNTER — Other Ambulatory Visit: Payer: Self-pay | Admitting: Internal Medicine

## 2023-12-04 ENCOUNTER — Ambulatory Visit

## 2023-12-07 ENCOUNTER — Other Ambulatory Visit: Payer: Self-pay | Admitting: Internal Medicine

## 2023-12-09 DIAGNOSIS — L821 Other seborrheic keratosis: Secondary | ICD-10-CM | POA: Diagnosis not present

## 2023-12-09 DIAGNOSIS — D485 Neoplasm of uncertain behavior of skin: Secondary | ICD-10-CM | POA: Diagnosis not present

## 2023-12-09 DIAGNOSIS — L308 Other specified dermatitis: Secondary | ICD-10-CM | POA: Diagnosis not present

## 2023-12-09 DIAGNOSIS — L309 Dermatitis, unspecified: Secondary | ICD-10-CM | POA: Diagnosis not present

## 2023-12-11 ENCOUNTER — Ambulatory Visit (INDEPENDENT_AMBULATORY_CARE_PROVIDER_SITE_OTHER)

## 2023-12-11 ENCOUNTER — Other Ambulatory Visit: Payer: Self-pay

## 2023-12-11 VITALS — BP 154/84 | HR 75 | Temp 98.4°F | Resp 12 | Ht <= 58 in | Wt 141.2 lb

## 2023-12-11 DIAGNOSIS — D869 Sarcoidosis, unspecified: Secondary | ICD-10-CM | POA: Diagnosis not present

## 2023-12-11 DIAGNOSIS — Z79899 Other long term (current) drug therapy: Secondary | ICD-10-CM

## 2023-12-11 DIAGNOSIS — H44113 Panuveitis, bilateral: Secondary | ICD-10-CM

## 2023-12-11 MED ORDER — SODIUM CHLORIDE 0.9 % IV SOLN
3.0000 mg/kg | Freq: Once | INTRAVENOUS | Status: AC
  Administered 2023-12-11: 200 mg via INTRAVENOUS
  Filled 2023-12-11: qty 20

## 2023-12-11 MED ORDER — DIPHENHYDRAMINE HCL 25 MG PO CAPS
25.0000 mg | ORAL_CAPSULE | Freq: Once | ORAL | Status: AC
  Administered 2023-12-11: 25 mg via ORAL
  Filled 2023-12-11: qty 1

## 2023-12-11 MED ORDER — ACETAMINOPHEN 325 MG PO TABS
650.0000 mg | ORAL_TABLET | Freq: Once | ORAL | Status: AC
  Administered 2023-12-11: 650 mg via ORAL
  Filled 2023-12-11: qty 2

## 2023-12-11 MED ORDER — METHYLPREDNISOLONE SODIUM SUCC 40 MG IJ SOLR
40.0000 mg | Freq: Once | INTRAMUSCULAR | Status: AC
  Administered 2023-12-11: 40 mg via INTRAVENOUS
  Filled 2023-12-11: qty 1

## 2023-12-11 NOTE — Progress Notes (Signed)
 Diagnosis:   Sarcoidosis    Provider:  Lonna Coder MD  Procedure: IV Infusion  IV Type: Peripheral, IV Location: R Forearm  Remicade  (Infliximab ), Dose: 200 mg  Infusion Start Time: 1455  Infusion Stop Time: 1711  Post Infusion IV Care: Peripheral IV Discontinued  Discharge: Condition: Good, Destination: Home . AVS Provided  Performed by:  Leita FORBES Miles, LPN

## 2023-12-12 ENCOUNTER — Ambulatory Visit: Payer: Self-pay | Admitting: Pulmonary Disease

## 2023-12-12 LAB — CBC WITH DIFFERENTIAL/PLATELET
Absolute Lymphocytes: 456 {cells}/uL — ABNORMAL LOW (ref 850–3900)
Absolute Monocytes: 319 {cells}/uL (ref 200–950)
Basophils Absolute: 29 {cells}/uL (ref 0–200)
Basophils Relative: 0.6 %
Eosinophils Absolute: 10 {cells}/uL — ABNORMAL LOW (ref 15–500)
Eosinophils Relative: 0.2 %
HCT: 42.3 % (ref 35.0–45.0)
Hemoglobin: 14.1 g/dL (ref 11.7–15.5)
MCH: 30.5 pg (ref 27.0–33.0)
MCHC: 33.3 g/dL (ref 32.0–36.0)
MCV: 91.4 fL (ref 80.0–100.0)
MPV: 9.4 fL (ref 7.5–12.5)
Monocytes Relative: 6.5 %
Neutro Abs: 4087 {cells}/uL (ref 1500–7800)
Neutrophils Relative %: 83.4 %
Platelets: 286 Thousand/uL (ref 140–400)
RBC: 4.63 Million/uL (ref 3.80–5.10)
RDW: 12 % (ref 11.0–15.0)
Total Lymphocyte: 9.3 %
WBC: 4.9 Thousand/uL (ref 3.8–10.8)

## 2023-12-12 LAB — COMPREHENSIVE METABOLIC PANEL WITH GFR
AG Ratio: 1.1 (calc) (ref 1.0–2.5)
ALT: 7 U/L (ref 6–29)
AST: 30 U/L (ref 10–35)
Albumin: 4.3 g/dL (ref 3.6–5.1)
Alkaline phosphatase (APISO): 70 U/L (ref 37–153)
BUN/Creatinine Ratio: 15 (calc) (ref 6–22)
BUN: 21 mg/dL (ref 7–25)
CO2: 25 mmol/L (ref 20–32)
Calcium: 9.7 mg/dL (ref 8.6–10.4)
Chloride: 104 mmol/L (ref 98–110)
Creat: 1.43 mg/dL — ABNORMAL HIGH (ref 0.60–1.00)
Globulin: 3.8 g/dL — ABNORMAL HIGH (ref 1.9–3.7)
Glucose, Bld: 101 mg/dL — ABNORMAL HIGH (ref 65–99)
Potassium: 4.6 mmol/L (ref 3.5–5.3)
Sodium: 140 mmol/L (ref 135–146)
Total Bilirubin: 0.5 mg/dL (ref 0.2–1.2)
Total Protein: 8.1 g/dL (ref 6.1–8.1)
eGFR: 38 mL/min/1.73m2 — ABNORMAL LOW (ref >=60)

## 2023-12-15 DIAGNOSIS — H539 Unspecified visual disturbance: Secondary | ICD-10-CM | POA: Diagnosis not present

## 2023-12-15 DIAGNOSIS — F3341 Major depressive disorder, recurrent, in partial remission: Secondary | ICD-10-CM | POA: Diagnosis not present

## 2023-12-15 DIAGNOSIS — D849 Immunodeficiency, unspecified: Secondary | ICD-10-CM | POA: Diagnosis not present

## 2023-12-15 DIAGNOSIS — D8689 Sarcoidosis of other sites: Secondary | ICD-10-CM | POA: Diagnosis not present

## 2023-12-15 DIAGNOSIS — N1832 Chronic kidney disease, stage 3b: Secondary | ICD-10-CM | POA: Diagnosis not present

## 2023-12-15 DIAGNOSIS — Z23 Encounter for immunization: Secondary | ICD-10-CM | POA: Diagnosis not present

## 2023-12-15 DIAGNOSIS — E785 Hyperlipidemia, unspecified: Secondary | ICD-10-CM | POA: Diagnosis not present

## 2023-12-23 ENCOUNTER — Ambulatory Visit (INDEPENDENT_AMBULATORY_CARE_PROVIDER_SITE_OTHER): Admitting: Pulmonary Disease

## 2023-12-23 VITALS — BP 123/89 | Temp 97.6°F | Wt 140.4 lb

## 2023-12-23 DIAGNOSIS — D8689 Sarcoidosis of other sites: Secondary | ICD-10-CM | POA: Diagnosis not present

## 2023-12-23 DIAGNOSIS — R591 Generalized enlarged lymph nodes: Secondary | ICD-10-CM

## 2023-12-23 DIAGNOSIS — N189 Chronic kidney disease, unspecified: Secondary | ICD-10-CM

## 2023-12-23 DIAGNOSIS — D869 Sarcoidosis, unspecified: Secondary | ICD-10-CM

## 2023-12-23 DIAGNOSIS — G20A1 Parkinson's disease without dyskinesia, without mention of fluctuations: Secondary | ICD-10-CM

## 2023-12-23 DIAGNOSIS — R21 Rash and other nonspecific skin eruption: Secondary | ICD-10-CM | POA: Diagnosis not present

## 2023-12-23 DIAGNOSIS — J984 Other disorders of lung: Secondary | ICD-10-CM

## 2023-12-23 NOTE — Progress Notes (Unsigned)
 Subjective:   PATIENT ID: Destiny Roth  [name]: female DOB: [birth year], MRN: [id number]   HPI  Chief Complaint  Patient presents with   Sarcoidosis    Reason for Visit: Follow-up  Mrs. Destiny  Roth is a 73 year old with Parkinson's disease, HTN, HLD and restrictive lung disease who presents for follow-up for sarcoid management.  Synopsis:  Initially seen as an inpatient Pulmonary consult by me on 02/23/19 for hypercalcemia. Bone biopsy was performed and demonstrated granulomas disease without necrosis. She was started on steroids and discharged with Endocrine and Pulmonary follow-up. Of note, she was previously seen in our Pulmonary clinic in 2018 for dyspnea and mild sleep apnea. Her prior PFTs demonstrate mild restrictive defect with normal DLCO which patient stated was attributed to her Parkinson's disease.  2021 - She is currently on tacrolimus 1 mg BID, methotrexate  and prednisone . She continues to have fatigue and visual blurriness. Her dyspnea also remains unchanged. Worsens with exertion. Tacrolimus was discontinued by ophthalmology due tremors. Started on CellCept  in November 2021.  2022 - Weaned off methotrexate . She still continues to have issues with fatigue, tremors and word finding after switching to Cellcept . When seen by her neurologist, she reports that her Parkinson's is stable. She was referred to Dr. Charmel to evaluate for neuro involvement of her sarcoid. Ophthalmology appt 03/14/20 ocular exam shows stable inflammation. Her ophthalmology note from 05/16/20 she has requested stopping taking immunosuppressants to see if these are the cause of her symptoms of fatigue, memory and tremor issues. Her fatigue and tremors have improved off the cellcept  however it remains persistent. Unclear if her memory is better off cellcept . Started on Humira  on 07/11/20. Started on Amantadine  for progressive dyskinesia related to her Parkinsons. She is unsure if the inhaler helps  but she is compliant with Incruse daily. She was seen by Neurology and gabapentin increased for newly diagnosed small fiber neuropathy. She reports that her vision is still distorted and occurs in the day but worsens at night.  2023 - Ophthalmology noted by Dr. Maree on 01/16/21 reviewed with no signs of active inflammation on eye exam. She reports that she is struggling with her lower extremity neuropathy which worsens her balance. She has been compliant with methotrexate  and Humira . She underwent spine surgery (C and L) in October and held her immunosuppression temporarily. She is having some hair loss that started with methotrexate  use. She reports her vision is unchanged. Having balance issues but thought to be due to Parkinsons. 2024 - Parkinson's has progressed and needing more support with dizziness. Discontinued Incruse due to cankar sores. Weaned off methotrexate  in March due to concern for side effects, however unable to differentiate between chronic med issues vs side effects of med. Remained on Humira  for this year. [facility] with Dr. Maree - no active inflammation on 11/12/22 exam.  2025 - Continuing to have trouble with dizziness and gait. For her sarcoid she transitioned from Humira  to Infliximab  03/12/23.   10/27/23 Since our last visit she had PET scan which was negative for sarcoid but abnormality in esophagus and colon. Underwent EGD on 10/09/23 and found with GERD with reflux esophagitis. Colonscopy normal. She continues to be on infliximab  which she is tolerating it. But has noticed a recent rash on her arm that improves with benadryl . Continues to have dizziness and gait issues that she attributes to her Parkinson's.  12/23/23 Since our last visit she has been compliant with IVIG and remicaid. She has recently had rash breakout  on 11/11/23 after IVIG dose was increased. Previously did not have this issue. She reports congestion with occasional cough. Recently had a cold. Denies fevers  and chills.  Social History: Never smoker Previously merchandiser, retail for admission at [facility]   Past Medical History:  Diagnosis Date   Abdominal pain, epigastric 09/18/2009   Anosmia 11/06/2010   ANXIETY 09/23/2006   Bunion    Left foot   BUNION, RIGHT FOOT 09/18/2009   CARPAL TUNNEL SYNDROME, BILATERAL 09/23/2006   DEPRESSION 09/23/2006   DIVERTICULOSIS, COLON 09/23/2006   GERD 05/24/2008   GLUCOSE INTOLERANCE 05/24/2008   Hemorrhoids    Hypercalcemia    HYPERLIPIDEMIA 09/23/2006   HYPERTENSION 09/23/2006   IBS 09/23/2006   Impaired glucose tolerance 11/04/2010   Kidney disease 2021   Osteopenia 2015   OSTEOPOROSIS 05/24/2008   06/01/19-pt states osteopenia   Parkinson's disease (HCC)    Restrictive lung disease 12/20/2013   Sarcoidosis 02/26/2019      Outpatient Medications Prior to Visit  Medication Sig Dispense Refill   acetaminophen  (TYLENOL ) 500 MG tablet Take 500 mg by mouth every 6 (six) hours as needed for mild pain.     Amantadine  HCl 100 MG tablet Take 100 mg by mouth in the morning, at noon, and at bedtime.     aspirin  EC 81 MG tablet Take 81 mg by mouth daily.     Biotin  1 MG CAPS Take 1 mg by mouth daily at 2 PM. 30 capsule 5   carbidopa -levodopa  (SINEMET  IR) 25-100 MG tablet Take by mouth.     Carbidopa -Levodopa  ER (SINEMET  CR) 25-100 MG tablet controlled release Take 1 tablet by mouth 2 (two) times daily.     fesoterodine (TOVIAZ) 4 MG TB24 tablet Take 4 mg by mouth daily.     gabapentin (NEURONTIN) 300 MG capsule Take 300 mg by mouth 3 (three) times daily.     immune globulin, human, (GAMMAGARD S/D LESS IGA) 10 g injection Inject into the vein.     inFLIXimab  (REMICADE  IV) Inject into the vein. Infuse 3 mg/kg at weeks 0, 2, and 6 then 3 mg/kg every 8 weeks thereater ([facility])     memantine (NAMENDA) 10 MG tablet Take 10 mg by mouth 2 (two) times daily.     pantoprazole  (PROTONIX ) 40 MG tablet Take 1 tablet (40 mg total) by mouth daily. 30  tablet 11   RESTASIS MULTIDOSE 0.05 % ophthalmic emulsion 1 drop 2 (two) times daily.     rOPINIRole  (REQUIP ) 1 MG tablet Take 1 tablet (1 mg total) by mouth 3 (three) times daily. 90 tablet 0   sertraline  (ZOLOFT ) 50 MG tablet Take 50 mg by mouth daily.     Triamcinolone  Acetonide 0.025 % LOTN 1 Application.     albuterol  (VENTOLIN  HFA) 108 (90 Base) MCG/ACT inhaler Inhale 2 puffs into the lungs every 6 (six) hours as needed for wheezing or shortness of breath. 8 g 2   memantine (NAMENDA) 5 MG tablet Take 10 mg by mouth 2 (two) times daily. Patient has not started it yet as she wants to know more about it (Patient not taking: Reported on 10/09/2023)     rosuvastatin  (CRESTOR ) 20 MG tablet TAKE 1 TABLET BY MOUTH EVERY DAY 15 tablet 0   senna (SENOKOT) 8.6 MG TABS tablet Take 1 tablet (8.6 mg total) by mouth 2 (two) times daily. (Patient not taking: Reported on 12/23/2023) 30 tablet 0   sertraline  (ZOLOFT ) 100 MG tablet Take 1 tablet by  mouth daily. (Patient not taking: Reported on 10/09/2023)     No facility-administered medications prior to visit.    Review of Systems  Constitutional:  Positive for malaise/fatigue. Negative for chills, diaphoresis, fever and weight loss.  HENT:  Positive for congestion.   Respiratory:  Positive for cough. Negative for hemoptysis, sputum production, shortness of breath and wheezing.   Cardiovascular:  Negative for chest pain, palpitations and leg swelling.   Objective:   Vitals:   12/23/23 1534  BP: 123/89  Temp: 97.6 F (36.4 C)  SpO2: 100%  Weight: 140 lb 6.4 oz (63.7 kg)     SpO2: 100 %  Physical Exam: General: Well-appearing, no acute distress HENT: [place], AT Eyes: EOMI, no scleral icterus Respiratory: LLL crackle at base.  Cardiovascular: RRR, -M/R/G, no JVD Extremities:-Edema,-tenderness Neuro: AAO x4, CNII-XII grossly intact Psych: Normal mood, normal affect  Data Reviewed:  Imaging: CXR 02/23/19 - interval resolutions of bilateral  pleural effusions CT Chest 02/09/19 - Bilateral pleural effusions. Mild ground glass opacifications bilaterally. No enlarged mediastinal or hilar adenopathy. PET Myocardial 01/07/20 - No evidence of increased uptake PET/CT 01/07/20 1. Bilateral ground-glass pulmonary opacities demonstrate mild-moderate FDG avidity. These findings are nonspecific and may be infectious or inflammatory.2. Mild FDG activity of non-enlarged bilateral hilar lymph nodes is also nonspecific.  This could be reactive in the setting of the pulmonary findings, but activity related to sarcoid cannot be excluded.  CXR 03/30/20 - No infiltrate, effusion or edema.  PET Myocardial 03/09/21 - Persistent FDG avid bilateral hilar lymphadenopathy. New FDG avid prevascular lymph node. Increased bilateral FDG avid groundglass pulmonary opacities. These remain nonspecific and are again favored to be infectious/ inflammatory etiology. No evidence of active cardiac sarcoidosis.  CT Cardiac 07/24/22 - Visualized lung parenchyma with no pulmonary nodules, masses, infiltrate, effusion or pneumothorax. PET/CT 04/22/23 - No evidence of sarcoid. Hypermetabolism in proximal ascending colon and focal mild hypermetabolism in the distal esophagus  PFT: 12/17/13 FVC 2.26 (86%) FEV1 1.88 (93%) Ratio 78  TLC 70% DLCO 75% Interpretation: Mild restrictive defect with mild reduction in gas exchange (uncorrected DLCO)  06/22/19 FVC 2.07 (95%) FEV1 1.75 (107%) Ratio 80  TLC 91% DLCO 63% Interpretation: No obstructive or restrictive defect. Mild reduction in gas exchange   06/10/20 FVC 2.11 (99%) FEV1 1.8 (113%) Ratio 82  TLC 132% RV 162% RV/TLC 130% DLCO 94% Interpretation: Normal spirometry and gas exchange. Compared to prior PFTs, patient has had increased lung volumes suggestive of air trapping. No significant bronchodilator response however does not preclude benefit. Clinically correlate  09/12/20 FVC 2.17 (102%) FEV1 1.87 (117%) Ratio 86  TLC 105% DLCO  88% Interpretation: Normal PFTs. Air trapping has resolved.  04/24/22 FVC 2.06 (100%) FEV1 1.77 (116%) Ratio 86  TLC 99% DLCO 90% Interpretation: Normal PFTs  10/07/23 FVC 1.84 (93%) FEV1 1.53 (104%) Ratio 83  TLC 127% DLCO 73% Interpretation: No obstructive or restrictive defect. Hyperinflation. Mildly reduced gas exchange   Echocardiogram: 04/22/19 - EF 60-65%. Grade I DD. No WMA or valvular abnormalities.  CBC    Latest Ref Rng & Units 12/11/2023    2:27 PM 10/08/2023   12:55 PM 07/23/2023    3:18 PM  CBC  WBC 3.8 - 10.8 Thousand/uL 4.9  3.6  5.4   Hemoglobin 11.7 - 15.5 g/dL 85.8  86.1  85.5   Hematocrit 35.0 - 45.0 % 42.3  41.1  42.6   Platelets 140 - 400 Thousand/uL 286  210  301.0   Resolved leukopenia  Latest Ref Rng & Units 12/11/2023    2:27 PM 10/08/2023   12:55 PM 07/23/2023    3:18 PM  BMP  Glucose 65 - 99 mg/dL 898  898  869   BUN 7 - 25 mg/dL 21  23  25    Creatinine 0.60 - 1.00 mg/dL 8.56  8.72  8.67   BUN/Creat Ratio 6 - 22 (calc) 15  18    Sodium 135 - 146 mmol/L 140  137  138   Potassium 3.5 - 5.3 mmol/L 4.6  5.2  4.1   Chloride 98 - 110 mmol/L 104  103  102   CO2 20 - 32 mmol/L 25  26  26    Calcium  8.6 - 10.4 mg/dL 9.7  9.5  9.9   Slightly increased Cr, electrolytes stable     Latest Ref Rng & Units 12/11/2023    2:27 PM 10/08/2023   12:55 PM 06/09/2023   11:27 AM  Hepatic Function  Total Protein 6.1 - 8.1 g/dL 8.1  8.1  8.0   Albumin 3.5 - 5.0 g/dL   4.1   AST 10 - 35 U/L 30  40  34   ALT 6 - 29 U/L 7  11  7    Alk Phosphatase 38 - 126 U/L   66   Total Bilirubin 0.2 - 1.2 mg/dL 0.5  0.5  0.8   Normalized liver function     Latest Ref Rng & Units 10/27/2023    4:08 PM  Quantiferon TB Gold  Quantiferon TB Gold Plus Negative Negative     Assessment & Plan:   Discussion: 72 year old female with sarcoid, Parkinson's disease who presents for sarcoid follow-up. In active flare.Weaned off methotrexate  March 2024. Transitioned from Humira  to  Infliximab  03/12/23. Tolerating infliximab  except for mild rash that has resolved with benadryl . She recently had hives with IVIG infusion. She does feel IVIG infusion is helping with her neuropathy however concerned about future reactions.  We discussed the clinical course of sarcoid and management including serial PFTs, labs, eye exam, and EKG and chest imaging if indicated. Addressed questions and concerns and reviewed immunosuppression plan as noted below.  Sarcoid with pulm, ocular, renal, bone and small fiber neuropathy involvement --Dx 02/26/19 vis bone biopsy at [facility] --Stable PFTs. Last completed 04/24/22 with normal PFTs --Echo reviewed from 03/2019 with grade I DD. EKG 11/01/21 overall normal. No prolonged PR interval --Sarcoid PET 12/2019 - negative for cardiac involvement. Suggests pulmonary involvement --Sarcoid PET 03/09/21 - active pulmonary involvement. Neg cardiac involvement.  --ORDER PET 02/2024>>Unable to obtain due to insurance. Will order CT Chest without contrast instead --CXR today to rule out pleural effusion  High risk medication management --Transitioned from Tacrolimus to Cellcept  11/2019 --Completed prednisone  taper 03/2020 --Started methotrexate  04/2020 --Started cellcept  07/2020. Methotrexate  concurrently weaned d/t intolerance --Prednisone  taper started with cellcept  discontinuation d/t intolerance 04/2020 --Immunosuppressant holiday  --Started Humira  on 07/11/20 for persistent ocular symptoms --S/p prednisone  taper from Dec 2022 to Jan 2023 --Readded methotrexate  02/2021-03/2022 --Off Humira  and transitioned to Infliximab  03/2023 --Eye exam with no inflammation on 04/30/22 and 11/12/22 and 10/23/23. Previously not quiescent on 11/2021 visit.  --Transitioned from Humira  to Infliximab . Started 06/18/23 --Will need labs with each infusion: CBC, CMET  Rash: Possible hypersensitivity reaction. Previously had mild rash with infliximab  that resolved with benadryl  however recent  hives with IVIG --Recommend benadryl  12.5 mg nightly for one week after infusion --Encouraged to discuss with Neurologist regarding IVIG infusion  Ocular sarcoid: posterior  uveitis, optic nerve edema and peripheral focal chorioretinal inflammation --Followed by [facility] - no active inflammation on 11/12/22 exam  --Immunosuppressants as above --Reviewed Neuro notes 07/30/22 - visual distortions may be related to Parkinson's or medication induced. Reducing Requip  --Remains on Humira  maintenance dosing --F/u [facility]  Shortness of breath secondary to sarcoid, deconditioning  --Normal PFTs on 04/24/22 --Discontinued Incruse due to canker sores --Reviewed PFTs. 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 Norine. 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 Brad Boards for Parkinson's  --Followed [facility] Dr. Prudencio at [facility] On gabapentin --On IVIG per Neurology  Parkinson's Hallucinations, difficulty concentrating, confusion  May be related to Parkinsons. Serum calcium  levels normal --Followed by [facility] Neurology --Continue Carbidopa /levodopa  --Continue Namenda  Health Maintenance Immunization History  Administered Date(s) Administered   Fluzone Influenza virus vaccine,trivalent (IIV3), split virus 12/06/2019   INFLUENZA, HIGH DOSE SEASONAL PF  11/30/2015, 11/16/2016, 10/29/2017, 10/30/2018, 12/09/2018, 10/31/2020   Influenza, Seasonal, Injecte, Preservative Fre 10/20/2012   Influenza,inj,Quad PF,6+ Mos 11/18/2013   Influenza-Unspecified 11/18/2013, 11/30/2014, 10/29/2017, 10/29/2018   Moderna Sars-Covid-2 Vaccination 03/12/2019, 04/09/2019, 12/06/2019, 05/18/2020   Pfizer Covid-19 Vaccine Bivalent Booster 60yrs & up 06/05/2021   Pneumococcal Conjugate-13 01/01/2013, 01/05/2014   Pneumococcal Polysaccharide-23 01/01/2013, 01/05/2014, 02/25/2020   Tdap 11/06/2010, 05/01/2021   Zoster Recombinant(Shingrix) 01/10/2018, 03/31/2018   Zoster, Live 11/06/2010, 01/15/2018, 03/20/2018   CT Lung Screen - not qualified  Orders Placed This Encounter  Procedures   DG Chest 2 View    Standing Status:   Future    Number of Occurrences:   1    Expiration Date:   12/22/2024    Reason for Exam (SYMPTOM  OR DIAGNOSIS REQUIRED):   cough    Preferred imaging location?:   [facility]   No orders of the defined types were placed in this encounter.  No follow-ups on file.  After PET CT in Feb  I have spent a total time of 41-minutes on the day of the appointment including chart review, data review, collecting history, coordinating care and discussing medical diagnosis and plan with the patient/family. Past medical history, allergies, medications were reviewed. Pertinent imaging, labs and tests included in this note have been reviewed and interpreted independently by me.  Billie Trager Slater Staff, MD [facility] 12/23/2023 4:06 PM

## 2023-12-24 ENCOUNTER — Telehealth (HOSPITAL_BASED_OUTPATIENT_CLINIC_OR_DEPARTMENT_OTHER): Payer: Self-pay

## 2023-12-24 ENCOUNTER — Ambulatory Visit (HOSPITAL_BASED_OUTPATIENT_CLINIC_OR_DEPARTMENT_OTHER)

## 2023-12-24 ENCOUNTER — Ambulatory Visit (HOSPITAL_BASED_OUTPATIENT_CLINIC_OR_DEPARTMENT_OTHER): Payer: Self-pay | Admitting: Pulmonary Disease

## 2023-12-24 ENCOUNTER — Encounter (HOSPITAL_BASED_OUTPATIENT_CLINIC_OR_DEPARTMENT_OTHER): Payer: Self-pay | Admitting: Pulmonary Disease

## 2023-12-24 DIAGNOSIS — D869 Sarcoidosis, unspecified: Secondary | ICD-10-CM

## 2023-12-24 DIAGNOSIS — R059 Cough, unspecified: Secondary | ICD-10-CM | POA: Diagnosis not present

## 2023-12-24 NOTE — Telephone Encounter (Signed)
 Copied from CRM #[id number]. Topic: Clinical - Medical Advice >> Dec 24, 2023 12:00 PM Dustin F wrote: Reason for CRM: Pt wanted to get Dr. Laymond opinion on whether or not she should remain taking the infusions or if she should stop and go to taking steroids. Pt stated she may go on Mychart and send a message as well (possible duplicate).  Pt's phone number is [phone].

## 2024-01-29 ENCOUNTER — Encounter: Payer: Self-pay | Admitting: Pulmonary Disease

## 2024-02-05 ENCOUNTER — Telehealth: Payer: Self-pay | Admitting: Pharmacy Technician

## 2024-02-05 ENCOUNTER — Ambulatory Visit (INDEPENDENT_AMBULATORY_CARE_PROVIDER_SITE_OTHER)

## 2024-02-05 ENCOUNTER — Other Ambulatory Visit: Payer: Self-pay

## 2024-02-05 VITALS — BP 157/82 | HR 80 | Temp 97.2°F | Resp 18 | Ht <= 58 in | Wt 144.6 lb

## 2024-02-05 DIAGNOSIS — D869 Sarcoidosis, unspecified: Secondary | ICD-10-CM

## 2024-02-05 DIAGNOSIS — H44113 Panuveitis, bilateral: Secondary | ICD-10-CM

## 2024-02-05 DIAGNOSIS — Z79899 Other long term (current) drug therapy: Secondary | ICD-10-CM

## 2024-02-05 MED ORDER — METHYLPREDNISOLONE SODIUM SUCC 40 MG IJ SOLR
40.0000 mg | Freq: Once | INTRAMUSCULAR | Status: AC
  Administered 2024-02-05: 40 mg via INTRAVENOUS
  Filled 2024-02-05: qty 1

## 2024-02-05 MED ORDER — DIPHENHYDRAMINE HCL 25 MG PO CAPS
25.0000 mg | ORAL_CAPSULE | Freq: Once | ORAL | Status: AC
  Administered 2024-02-05: 25 mg via ORAL
  Filled 2024-02-05: qty 1

## 2024-02-05 MED ORDER — ACETAMINOPHEN 325 MG PO TABS
650.0000 mg | ORAL_TABLET | Freq: Once | ORAL | Status: AC
  Administered 2024-02-05: 650 mg via ORAL
  Filled 2024-02-05: qty 2

## 2024-02-05 MED ORDER — SODIUM CHLORIDE 0.9 % IV SOLN
3.0000 mg/kg | Freq: Once | INTRAVENOUS | Status: AC
  Administered 2024-02-05: 200 mg via INTRAVENOUS
  Filled 2024-02-05: qty 20

## 2024-02-05 NOTE — Progress Notes (Signed)
 Diagnosis: Sarcoidosis  Provider:  Mannam, Praveen MD  Procedure: IV Infusion  IV Type: Peripheral, IV Location: L Hand  Remicade  (Infliximab ), Dose: 200 mg  Infusion Start Time: 1442  Infusion Stop Time: 1653  Post Infusion IV Care: Observation period completed and Peripheral IV Discontinued  Discharge: Condition: Good, Destination: Home . AVS Declined  Performed by:  Rocky FORBES Search, RN    Patient c/o 7/10 mid-sternal chest pain when she arrived for her appointment today. Stated the pain was intermittent, and has been ongoing in a similar fashion for months. Stated changes in position (e.g., lying down) helped to improve the pain. Stated that she had several procedures months ago, and is being treated for an inflamed trachea. VSS, BP elevated. Attempted to reach ordering provider, Chi Slater Staff, MD via phone call to office, but office stated the provider was not available today. Secure message sent to infusion medical director Dr. Praveen Mannam, MD at 1347; per Dr. Theophilus, ok to proceed with today's infusion.

## 2024-02-05 NOTE — Telephone Encounter (Signed)
 Auth Submission: NO AUTH NEEDED Site of care: Site of care: [facility] Payer: MEDICARE A/B Medication & CPT/J Code(s) submitted: Remicade  (Infliximab ) J1745 Diagnosis Code: H44.113 Route of submission (phone, fax, portal):  Phone # Fax # Auth type: Buy/Bill PB Units/visits requested: 200MG  [id number] Reference number:  Approval from: 02/05/24 to 02/27/25

## 2024-02-06 LAB — CBC WITH DIFFERENTIAL/PLATELET
Absolute Lymphocytes: 820 {cells}/uL — ABNORMAL LOW (ref 850–3900)
Absolute Monocytes: 531 {cells}/uL (ref 200–950)
Basophils Absolute: 30 {cells}/uL (ref 0–200)
Basophils Relative: 0.5 %
Eosinophils Absolute: 0 {cells}/uL — ABNORMAL LOW (ref 15–500)
Eosinophils Relative: 0 %
HCT: 46.3 % — ABNORMAL HIGH (ref 35.9–46.0)
Hemoglobin: 15.2 g/dL (ref 11.7–15.5)
MCH: 29.5 pg (ref 27.0–33.0)
MCHC: 32.8 g/dL (ref 31.6–35.4)
MCV: 89.9 fL (ref 81.4–101.7)
MPV: 9.2 fL (ref 7.5–12.5)
Monocytes Relative: 9 %
Neutro Abs: 4519 {cells}/uL (ref 1500–7800)
Neutrophils Relative %: 76.6 %
Platelets: 262 Thousand/uL (ref 140–400)
RBC: 5.15 Million/uL — ABNORMAL HIGH (ref 3.80–5.10)
RDW: 12.4 % (ref 11.0–15.0)
Total Lymphocyte: 13.9 %
WBC: 5.9 Thousand/uL (ref 3.8–10.8)

## 2024-02-06 LAB — COMPREHENSIVE METABOLIC PANEL WITH GFR
AG Ratio: 1.2 (calc) (ref 1.0–2.5)
ALT: 15 U/L (ref 6–29)
AST: 31 U/L (ref 10–35)
Albumin: 4.5 g/dL (ref 3.6–5.1)
Alkaline phosphatase (APISO): 77 U/L (ref 37–153)
BUN/Creatinine Ratio: 12 (calc) (ref 6–22)
BUN: 18 mg/dL (ref 7–25)
CO2: 24 mmol/L (ref 20–32)
Calcium: 10.1 mg/dL (ref 8.6–10.4)
Chloride: 102 mmol/L (ref 98–110)
Creat: 1.46 mg/dL — ABNORMAL HIGH (ref 0.60–1.00)
Globulin: 3.7 g/dL (ref 1.9–3.7)
Glucose, Bld: 108 mg/dL — ABNORMAL HIGH (ref 65–99)
Potassium: 4.4 mmol/L (ref 3.5–5.3)
Sodium: 138 mmol/L (ref 135–146)
Total Bilirubin: 0.5 mg/dL (ref 0.2–1.2)
Total Protein: 8.2 g/dL — ABNORMAL HIGH (ref 6.1–8.1)
eGFR: 37 mL/min/1.73m2 — ABNORMAL LOW

## 2024-02-13 ENCOUNTER — Ambulatory Visit (HOSPITAL_BASED_OUTPATIENT_CLINIC_OR_DEPARTMENT_OTHER): Payer: Self-pay | Admitting: Pulmonary Disease

## 2024-02-26 ENCOUNTER — Encounter: Payer: Self-pay | Admitting: Pulmonary Disease

## 2024-03-09 ENCOUNTER — Ambulatory Visit (HOSPITAL_BASED_OUTPATIENT_CLINIC_OR_DEPARTMENT_OTHER)

## 2024-03-18 ENCOUNTER — Ambulatory Visit (HOSPITAL_BASED_OUTPATIENT_CLINIC_OR_DEPARTMENT_OTHER): Admitting: Pulmonary Disease

## 2024-04-01 ENCOUNTER — Ambulatory Visit
